# Patient Record
Sex: Female | Born: 1995 | Race: Black or African American | Hispanic: No | Marital: Single | Smoking: Never smoker
Health system: Southern US, Community
[De-identification: ages and names within clinical notes are randomized; demographics above are authoritative.]

## PROBLEM LIST (undated history)

## (undated) ENCOUNTER — Inpatient Hospital Stay (HOSPITAL_COMMUNITY): Payer: Self-pay

## (undated) ENCOUNTER — Emergency Department (HOSPITAL_COMMUNITY): Admission: EM | Payer: Medicaid Other | Source: Home / Self Care

## (undated) DIAGNOSIS — R519 Headache, unspecified: Secondary | ICD-10-CM

## (undated) DIAGNOSIS — N390 Urinary tract infection, site not specified: Secondary | ICD-10-CM

## (undated) DIAGNOSIS — R011 Cardiac murmur, unspecified: Secondary | ICD-10-CM

## (undated) DIAGNOSIS — F329 Major depressive disorder, single episode, unspecified: Secondary | ICD-10-CM

## (undated) DIAGNOSIS — J45909 Unspecified asthma, uncomplicated: Secondary | ICD-10-CM

## (undated) DIAGNOSIS — L309 Dermatitis, unspecified: Secondary | ICD-10-CM

## (undated) DIAGNOSIS — R87629 Unspecified abnormal cytological findings in specimens from vagina: Secondary | ICD-10-CM

## (undated) DIAGNOSIS — F419 Anxiety disorder, unspecified: Secondary | ICD-10-CM

## (undated) DIAGNOSIS — F32A Depression, unspecified: Secondary | ICD-10-CM

## (undated) DIAGNOSIS — F319 Bipolar disorder, unspecified: Secondary | ICD-10-CM

## (undated) DIAGNOSIS — A549 Gonococcal infection, unspecified: Secondary | ICD-10-CM

## (undated) HISTORY — DX: Cardiac murmur, unspecified: R01.1

## (undated) HISTORY — PX: WRIST SURGERY: SHX841

## (undated) HISTORY — DX: Unspecified asthma, uncomplicated: J45.909

## (undated) HISTORY — DX: Unspecified abnormal cytological findings in specimens from vagina: R87.629

## (undated) HISTORY — PX: CATARACT EXTRACTION: SUR2

## (undated) HISTORY — PX: WISDOM TOOTH EXTRACTION: SHX21

---
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
## 2016-01-14 ENCOUNTER — Ambulatory Visit: Payer: Self-pay

## 2016-01-14 DIAGNOSIS — N390 Urinary tract infection, site not specified: Secondary | ICD-10-CM

## 2016-01-14 LAB — POCT URINALYSIS DIP (DEVICE)
Bilirubin Urine: NEGATIVE
Glucose, UA: 100 mg/dL — AB
Ketones, ur: NEGATIVE mg/dL
NITRITE: POSITIVE — AB
PH: 5.5 (ref 5.0–8.0)
Protein, ur: NEGATIVE mg/dL
Specific Gravity, Urine: <=1.005 (ref 1.005–1.030)
Urobilinogen, UA: 1 mg/dL (ref 0.0–1.0)

## 2016-01-14 MED ORDER — SULFAMETHOXAZOLE-TRIMETHOPRIM 800-160 MG PO TABS: 1.0000 | ORAL_TABLET | Freq: Two times a day (BID) | ORAL | Status: DC

## 2016-01-14 MED ORDER — PHENAZOPYRIDINE HCL 200 MG PO TABS: 200.0000 mg | ORAL_TABLET | Freq: Three times a day (TID) | ORAL | Status: DC | PRN

## 2016-01-14 NOTE — Progress Notes (Signed)
Pt reports having pain with urination.  Pt states "Feels like spikes as I go to the bathroom".  Pt informed me that she had taken a friends medication that started with a p.  I advised pt to never take another person's medication.  Pt stated understanding.  Pt prescribed Bactrim DS and pyridium according to standing orders.  Urine culture sent.  Pt informed that if there is a different form of regimen that needs to be done we will call her.  Pt stated understanding with no further questions.

## 2016-01-15 ENCOUNTER — Ambulatory Visit: Payer: Medicaid Other | Admitting: Obstetrics

## 2016-01-16 LAB — URINE CULTURE
Colony Count: NO GROWTH
Organism ID, Bacteria: NO GROWTH

## 2016-02-16 ENCOUNTER — Encounter: Payer: Self-pay | Admitting: *Deleted

## 2016-02-16 ENCOUNTER — Encounter: Payer: Self-pay | Admitting: Obstetrics

## 2016-02-16 ENCOUNTER — Ambulatory Visit (INDEPENDENT_AMBULATORY_CARE_PROVIDER_SITE_OTHER): Payer: Medicaid Other | Admitting: Obstetrics

## 2016-02-16 VITALS — BP 122/76 | HR 102 | Temp 98.1°F | Wt 148.0 lb

## 2016-02-16 DIAGNOSIS — A499 Bacterial infection, unspecified: Secondary | ICD-10-CM | POA: Diagnosis not present

## 2016-02-16 DIAGNOSIS — B373 Candidiasis of vulva and vagina: Secondary | ICD-10-CM

## 2016-02-16 DIAGNOSIS — N898 Other specified noninflammatory disorders of vagina: Secondary | ICD-10-CM

## 2016-02-16 DIAGNOSIS — N760 Acute vaginitis: Secondary | ICD-10-CM | POA: Diagnosis not present

## 2016-02-16 DIAGNOSIS — B9689 Other specified bacterial agents as the cause of diseases classified elsewhere: Secondary | ICD-10-CM

## 2016-02-16 DIAGNOSIS — B3731 Acute candidiasis of vulva and vagina: Secondary | ICD-10-CM

## 2016-02-16 LAB — POCT URINALYSIS DIPSTICK
BILIRUBIN UA: NEGATIVE
Blood, UA: NEGATIVE
KETONES UA: NEGATIVE
Nitrite, UA: NEGATIVE
Protein, UA: NEGATIVE
SPEC GRAV UA: 1.02
Urobilinogen, UA: NEGATIVE
pH, UA: 5

## 2016-02-16 MED ORDER — METRONIDAZOLE 500 MG PO TABS: 500.0000 mg | ORAL_TABLET | Freq: Two times a day (BID) | ORAL | Status: DC

## 2016-02-16 MED ORDER — FLUCONAZOLE 150 MG PO TABS: 150.0000 mg | ORAL_TABLET | Freq: Once | ORAL | Status: DC

## 2016-02-16 NOTE — Progress Notes (Signed)
Patient ID: Andrea Jenkins, female   DOB: 09/26/1996, 20 y.o.   MRN: [id number]  Chief Complaint  Patient presents with  . Gynecologic Exam    Patient complains of alot of discharge this morning. Patient states it has an odor. Some uncomfortable feeling in the vagina.    HPI Andrea Gowdaonesha L Elman is a 20 y.o. female.  Malodorous vaginal discharge and vaginal irritation.  HPI  History reviewed. No pertinent past medical history.  Past Surgical History  Procedure Laterality Date  . No past surgeries      Family History  Problem Relation Age of Onset  . Cancer Neg Hx   . Heart disease Neg Hx   . Diabetes Neg Hx   . Hypertension Maternal Grandmother     Social History Social History  Substance Use Topics  . Smoking status: Passive Smoke Exposure - Never Smoker -- 1.00 packs/day    Types: Cigars, Cigarettes  . Smokeless tobacco: Never Used  . Alcohol Use: No     Comment: occasional    No Known Allergies  Current Outpatient Prescriptions  Medication Sig Dispense Refill  . fluconazole (DIFLUCAN) 150 MG tablet Take 1 tablet (150 mg total) by mouth once. 1 tablet 2  . metroNIDAZOLE (FLAGYL) 500 MG tablet Take 1 tablet (500 mg total) by mouth 2 (two) times daily. 14 tablet 2   No current facility-administered medications for this visit.    Review of Systems Review of Systems Constitutional: negative for fatigue and weight loss Respiratory: negative for cough and wheezing Cardiovascular: negative for chest pain, fatigue and palpitations Gastrointestinal: negative for abdominal pain and change in bowel habits Genitourinary: positive for vaginal discharge and irritation Integument/breast: negative for nipple discharge Musculoskeletal:negative for myalgias Neurological: negative for gait problems and tremors Behavioral/Psych: negative for abusive relationship, depression Endocrine: negative for temperature intolerance     Blood pressure 122/76, pulse 102, temperature 98.1  F (36.7 C), weight 148 lb (67.132 kg), last menstrual period 02/10/2016.  Physical Exam Physical Exam            General:  Alert and no distress Abdomen:  normal findings: no organomegaly, soft, non-tender and no hernia  Pelvis:  External genitalia: normal general appearance Urinary system: urethral meatus normal and bladder without fullness, nontender Vaginal: normal without tenderness, induration or masses Cervix: normal appearance Adnexa: normal bimanual exam Uterus: anteverted and non-tender, normal size      Data Reviewed Labs  Assessment     BV     Plan    Flagyl Rx F/U prn   Orders Placed This Encounter  Procedures  . NuSwab Vaginitis Plus (VG+)  . POCT urinalysis dipstick   Meds ordered this encounter  Medications  . metroNIDAZOLE (FLAGYL) 500 MG tablet    Sig: Take 1 tablet (500 mg total) by mouth 2 (two) times daily.    Dispense:  14 tablet    Refill:  2  . fluconazole (DIFLUCAN) 150 MG tablet    Sig: Take 1 tablet (150 mg total) by mouth once.    Dispense:  1 tablet    Refill:  2

## 2016-02-19 ENCOUNTER — Other Ambulatory Visit: Payer: Self-pay | Admitting: Obstetrics

## 2016-02-19 DIAGNOSIS — A549 Gonococcal infection, unspecified: Secondary | ICD-10-CM

## 2016-02-19 LAB — NUSWAB VAGINITIS PLUS (VG+)
Atopobium vaginae: HIGH Score — AB
Candida albicans, NAA: POSITIVE — AB
Candida glabrata, NAA: NEGATIVE
Chlamydia trachomatis, NAA: NEGATIVE
NEISSERIA GONORRHOEAE, NAA: POSITIVE — AB
Trich vag by NAA: NEGATIVE

## 2016-02-19 MED ORDER — AZITHROMYCIN 250 MG PO TABS: 1000.0000 mg | ORAL_TABLET | Freq: Once | ORAL | Status: DC

## 2016-02-19 MED ORDER — CEFTRIAXONE SODIUM 250 MG IJ SOLR: 250.0000 mg | Freq: Once | INTRAMUSCULAR | Status: DC

## 2016-02-23 ENCOUNTER — Telehealth: Payer: Self-pay | Admitting: *Deleted

## 2016-02-24 ENCOUNTER — Ambulatory Visit: Payer: Medicaid Other

## 2016-02-24 VITALS — BP 109/71 | HR 65 | Wt 148.0 lb

## 2016-02-24 DIAGNOSIS — A549 Gonococcal infection, unspecified: Secondary | ICD-10-CM

## 2016-02-24 MED ORDER — CEFTRIAXONE SODIUM 1 G IJ SOLR
250.0000 mg | Freq: Once | INTRAMUSCULAR | Status: AC
  Administered 2016-02-24: 250 mg via INTRAMUSCULAR

## 2016-02-24 NOTE — Progress Notes (Unsigned)
Pt is in office for Rocephin injection for +GC. Pt tolerated injection well.  Pt advised of all STD recommendations. Pt advised to f/u in a couple of months for repeat testing.   Pt states that she may get lab work at that visit, does not want today.  Administrations This Visit    cefTRIAXone (ROCEPHIN) injection 250 mg    Admin Date Action Dose Route Administered By         02/24/2016 Given 250 mg Intramuscular Lanney GinsSuzanne D Anberlyn Feimster, CMA

## 2016-02-25 NOTE — Telephone Encounter (Signed)
Error- see lab note

## 2016-03-10 ENCOUNTER — Ambulatory Visit (INDEPENDENT_AMBULATORY_CARE_PROVIDER_SITE_OTHER): Payer: Medicaid Other | Admitting: Obstetrics

## 2016-03-10 VITALS — BP 104/66 | HR 66 | Wt 154.0 lb

## 2016-03-10 DIAGNOSIS — A549 Gonococcal infection, unspecified: Secondary | ICD-10-CM

## 2016-03-10 MED ORDER — CEFTRIAXONE SODIUM 250 MG IJ SOLR: 250.0000 mg | Freq: Once | INTRAMUSCULAR | Status: DC

## 2016-03-10 MED ORDER — CEFTRIAXONE SODIUM 1 G IJ SOLR
250.0000 mg | Freq: Once | INTRAMUSCULAR | Status: AC
  Administered 2016-03-10: 250 mg via INTRAMUSCULAR

## 2016-03-10 MED ORDER — AZITHROMYCIN 250 MG PO TABS: 1000.0000 mg | ORAL_TABLET | Freq: Once | ORAL | Status: DC

## 2016-03-11 ENCOUNTER — Encounter: Payer: Self-pay | Admitting: Obstetrics

## 2016-03-11 NOTE — Progress Notes (Signed)
Patient ID: Andrea Jenkins, female   DOB: 22-Sep-1996, 20 y.o.   MRN: [id number]  Chief Complaint  Patient presents with  . 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 female.  H/O GC exposure and treatment. Had sex before partner was treated.  HPI  History reviewed. No pertinent past medical history.  Past Surgical History  Procedure Laterality Date  . No past surgeries      Family History  Problem Relation Age of Onset  . Cancer Neg Hx   . Heart disease Neg Hx   . Diabetes Neg Hx   . Hypertension Maternal Grandmother     Social History Social History  Substance Use Topics  . Smoking status: Passive Smoke Exposure - Never Smoker -- 1.00 packs/day    Types: Cigars, Cigarettes  . Smokeless tobacco: Never Used  . Alcohol Use: No     Comment: occasional    No Known Allergies  Current Outpatient Prescriptions  Medication Sig Dispense Refill  . Probiotic Product (PROBIOTIC DAILY PO) Take by mouth.    Marland Kitchen azithromycin (ZITHROMAX) 250 MG tablet Take 4 tablets (1,000 mg total) by mouth once. 4 tablet 0  . cefTRIAXone (ROCEPHIN) 250 MG injection Inject 250 mg into the muscle once. FOR IM use in LARGE MUSCLE MASS 1 each 0   No current facility-administered medications for this visit.    Review of Systems Review of Systems Constitutional: negative for fatigue and weight loss Respiratory: negative for cough and wheezing Cardiovascular: negative for chest pain, fatigue and palpitations Gastrointestinal: negative for abdominal pain and change in bowel habits Genitourinary:negative Integument/breast: negative for nipple discharge Musculoskeletal:negative for myalgias Neurological: negative for gait problems and tremors Behavioral/Psych: negative for abusive relationship, depression Endocrine: negative for temperature intolerance     Blood pressure 104/66, pulse 66, weight 154 lb (69.854 kg), last menstrual period  03/09/2016.  Physical Exam Physical Exam:  Deferred                                                                                                                                                                                                                                                                  Data  Reviewed Labs  Assessment     [facility]    Plan    Rocephin / Azithromycin Rx F/U in 3 months for TOC cultures   No orders of the defined types were placed in this encounter.   Meds ordered this encounter  Medications  . cefTRIAXone (ROCEPHIN) 250 MG injection    Sig: Inject 250 mg into the muscle once. FOR IM use in LARGE MUSCLE MASS    Dispense:  1 each    Refill:  0  . azithromycin (ZITHROMAX) 250 MG tablet    Sig: Take 4 tablets (1,000 mg total) by mouth once.    Dispense:  4 tablet    Refill:  0  . cefTRIAXone (ROCEPHIN) injection 250 mg    Sig:     Order Specific Question:  Antibiotic Indication:    Answer:  STD

## 2016-04-28 ENCOUNTER — Ambulatory Visit: Payer: Medicaid Other | Admitting: Obstetrics

## 2016-04-30 ENCOUNTER — Ambulatory Visit: Payer: Medicaid Other | Admitting: Obstetrics

## 2016-05-21 ENCOUNTER — Encounter (HOSPITAL_COMMUNITY): Payer: Self-pay | Admitting: *Deleted

## 2016-05-21 ENCOUNTER — Emergency Department (HOSPITAL_COMMUNITY)
Admission: EM | Admit: 2016-05-21 | Discharge: 2016-05-22 | Disposition: A | Discharge status: Home / Self Care | Payer: Medicaid Other | Attending: Emergency Medicine | Admitting: Emergency Medicine

## 2016-05-21 DIAGNOSIS — Z7722 Contact with and (suspected) exposure to environmental tobacco smoke (acute) (chronic): Secondary | ICD-10-CM | POA: Insufficient documentation

## 2016-05-21 DIAGNOSIS — Z202 Contact with and (suspected) exposure to infections with a predominantly sexual mode of transmission: Secondary | ICD-10-CM | POA: Insufficient documentation

## 2016-05-21 DIAGNOSIS — M545 Low back pain, unspecified: Secondary | ICD-10-CM

## 2016-05-21 LAB — URINALYSIS, ROUTINE W REFLEX MICROSCOPIC
BILIRUBIN URINE: NEGATIVE
GLUCOSE, UA: NEGATIVE mg/dL
HGB URINE DIPSTICK: NEGATIVE
Ketones, ur: 15 mg/dL — AB
Leukocytes, UA: NEGATIVE
Nitrite: NEGATIVE
PH: 7 (ref 5.0–8.0)
Protein, ur: 30 mg/dL — AB
SPECIFIC GRAVITY, URINE: 1.026 (ref 1.005–1.030)

## 2016-05-21 LAB — URINE MICROSCOPIC-ADD ON

## 2016-05-21 LAB — WET PREP, GENITAL
Sperm: NONE SEEN
TRICH WET PREP: NONE SEEN
YEAST WET PREP: NONE SEEN

## 2016-05-21 LAB — POC URINE PREG, ED: Preg Test, Ur: NEGATIVE

## 2016-05-21 MED ORDER — CEFTRIAXONE SODIUM 250 MG IJ SOLR
250.0000 mg | Freq: Once | INTRAMUSCULAR | Status: AC
  Administered 2016-05-22: 250 mg via INTRAMUSCULAR
  Filled 2016-05-21: qty 250

## 2016-05-21 MED ORDER — AZITHROMYCIN 600 MG PO TABS
1200.0000 mg | ORAL_TABLET | Freq: Once | ORAL | Status: AC
  Administered 2016-05-22: 1200 mg via ORAL
  Filled 2016-05-21: qty 2

## 2016-05-21 NOTE — ED Provider Notes (Signed)
Emergency Department Provider Note   I have reviewed the triage vital signs and the nursing notes.   HISTORY  Chief Complaint Back Pain and SEXUALLY TRANSMITTED DISEASE   HPI Andrea ZIELSDORF is a 20 y.o. female with PMH of UTI and prior STI presents to the emergency department for evaluation of left lower back pain in the setting of recent diagnosis of gonorrhea without being treated. Patient states that approximately 10 days ago she was diagnosed with gonorrhea. She states that time she was having some mild vaginal discharge and had some unprotected encounters leading her to seek testing. At that time she was tested for HIV as well and was told this was negative. The culture back the next day and told her she had gonorrhea but the patient did not get treatment at that time. Several days later she developed left lower back pain without associated dysuria, hesitancy, urgency. No trauma. Patient has continued vaginal discharge with no bleeding. Last menstrual period was May 09 2916. No abdominal discomfort, chest pain or difficulty breathing.  History reviewed. No pertinent past medical history.  Patient Active Problem List   Diagnosis Date Noted  . UTI symptoms 10/03/2014  . BV (bacterial vaginosis) 11/14/2013  . Vaginitis and vulvovaginitis, unspecified 02/07/2013    Past Surgical History:  Procedure Laterality Date  . NO PAST SURGERIES      Current Outpatient Rx  . Order #: [id number] Class: Normal  . Order #: [id number] Class: Sample  . Order #: [id number] Class: Historical Med    Allergies Review of patient's allergies indicates no known allergies.  Family History  Problem Relation Age of Onset  . Hypertension Maternal Grandmother   . Cancer Neg Hx   . Heart disease Neg Hx   . Diabetes Neg Hx     Social History Social History  Substance Use Topics  . Smoking status: Passive Smoke Exposure - Never Smoker    Packs/day: 1.00    Types: Cigars, Cigarettes  .  Smokeless tobacco: Never Used  . Alcohol use No     Comment: occasional    Review of Systems  Constitutional: No fever/chills Eyes: No visual changes. ENT: No sore throat. Cardiovascular: Denies chest pain. Respiratory: Denies shortness of breath. Gastrointestinal: No abdominal pain.  No nausea, no vomiting.  No diarrhea.  No constipation. Genitourinary: Negative for dysuria. Positive vaginal discharge Musculoskeletal: Left sided back discomfort.  Skin: Negative for rash. Neurological: Negative for headaches, focal weakness or numbness.  10-point ROS otherwise negative.  ____________________________________________   PHYSICAL EXAM:  VITAL SIGNS: ED Triage Vitals  Enc Vitals Group     BP 05/21/16 1744 118/83     Pulse Rate 05/21/16 1744 73     Resp 05/21/16 1744 18     Temp 05/21/16 1744 98.8 F (37.1 C)     Temp Source 05/21/16 1744 Oral     SpO2 05/21/16 1744 100 %     Weight 05/21/16 1744 144 lb 7 oz (65.5 kg)     Height 05/21/16 1744 5' 3.5" (1.613 m)     Pain Score 05/21/16 1743 8    Constitutional: Alert and oriented. Well appearing and in no acute distress. Eyes: Conjunctivae are normal. PERRL. EOMI. Head: Atraumatic. Nose: No congestion/rhinnorhea. Mouth/Throat: Mucous membranes are moist.  Oropharynx non-erythematous. Neck: No stridor.   Cardiovascular: Normal rate, regular rhythm. Good peripheral circulation. Grossly normal heart sounds.   Respiratory: Normal respiratory effort.  No retractions. Lungs CTAB. Gastrointestinal: Soft and nontender. No distention. Pelvic exam with  whitish vaginal discharge with no gross blood. No CMT. No adnexal tenderness or fullness.  Musculoskeletal: No lower extremity tenderness nor edema. No gross deformities of extremities. Neurologic:  Normal speech and language. No gross focal neurologic deficits are appreciated.  Skin:  Skin is warm, dry and intact. No rash noted. Psychiatric: Mood and affect are normal. Speech and  behavior are normal.  ____________________________________________   LABS (all labs ordered are listed, but only abnormal results are displayed)  Labs Reviewed  WET PREP, GENITAL - Abnormal; Notable for the following:       Result Value   Clue Cells Wet Prep HPF POC PRESENT (*)    WBC, Wet Prep HPF POC MODERATE (*)    All other components within normal limits  URINALYSIS, ROUTINE W REFLEX MICROSCOPIC (NOT AT [facility]) - Abnormal; Notable for the following:    APPearance CLOUDY (*)    Ketones, ur 15 (*)    Protein, ur 30 (*)    All other components within normal limits  URINE MICROSCOPIC-ADD ON - Abnormal; Notable for the following:    Squamous Epithelial / LPF 6-30 (*)    Bacteria, UA FEW (*)    All other components within normal limits  POC URINE PREG, ED  GC/CHLAMYDIA PROBE AMP ([place]) NOT AT [facility]   ____________________________________________  RADIOLOGY  None ____________________________________________   PROCEDURES  Procedure(s) performed:   Procedures  None ____________________________________________   INITIAL IMPRESSION / ASSESSMENT AND PLAN / ED COURSE  Pertinent labs & imaging results that were available during my care of the patient were reviewed by me and considered in my medical decision making (see chart for details).  Patient resents to the emergency department for evaluation of vaginal discharge in setting of recent diagnosis of gonorrhea for which she did not obtain treatment. After pelvic exam the patient has no adnexal or cervical motion tenderness to suggest PID or tubo-ovarian abscess. She has no clinical signs or symptoms to just pyelonephritis. We'll follow urinalysis, u preg, and STI testing. Did not order HIV or RPR with patient being recently tested. Plan to treat for uncomplicated STI.    ____________________________________________  FINAL CLINICAL IMPRESSION(S) / ED DIAGNOSES  Final diagnoses:  STD exposure  Left-sided low back  pain without sciatica     MEDICATIONS GIVEN DURING THIS VISIT:  Medications  cefTRIAXone (ROCEPHIN) injection 250 mg (not administered)  azithromycin (ZITHROMAX) tablet 1,200 mg (not administered)     NEW OUTPATIENT MEDICATIONS STARTED DURING THIS VISIT:  None    Note:  This document was prepared using Dragon voice recognition software and may include unintentional dictation errors.  Alona Bene, MD Emergency Medicine   Maia Plan, MD 05/22/16 Burna Mortimer

## 2016-05-21 NOTE — ED Notes (Signed)
Dr. Long at bedside at this time.  

## 2016-05-21 NOTE — ED Triage Notes (Signed)
Pt reports having lower back pain, denies urinary symptoms. Has been tested for std and was + for gonorrhea and needs treatment.

## 2016-05-22 MED ORDER — STERILE WATER FOR INJECTION IJ SOLN
INTRAMUSCULAR | Status: AC
  Administered 2016-05-22: 1 mL
  Filled 2016-05-22: qty 10

## 2016-05-22 NOTE — ED Notes (Signed)
Pt departed in NAD, refused use of wheelchair.  

## 2016-05-22 NOTE — Discharge Instructions (Signed)

## 2016-05-25 LAB — GC/CHLAMYDIA PROBE AMP (~~LOC~~) NOT AT ARMC
CHLAMYDIA, DNA PROBE: NEGATIVE
Neisseria Gonorrhea: NEGATIVE

## 2016-06-03 ENCOUNTER — Ambulatory Visit (INDEPENDENT_AMBULATORY_CARE_PROVIDER_SITE_OTHER): Payer: Medicaid Other | Admitting: Obstetrics

## 2016-06-03 ENCOUNTER — Encounter: Payer: Self-pay | Admitting: Obstetrics

## 2016-06-03 VITALS — BP 122/82 | HR 66 | Wt 146.0 lb

## 2016-06-03 DIAGNOSIS — A549 Gonococcal infection, unspecified: Secondary | ICD-10-CM

## 2016-06-03 DIAGNOSIS — Z202 Contact with and (suspected) exposure to infections with a predominantly sexual mode of transmission: Secondary | ICD-10-CM

## 2016-06-03 DIAGNOSIS — Z3202 Encounter for pregnancy test, result negative: Secondary | ICD-10-CM

## 2016-06-03 DIAGNOSIS — Z30013 Encounter for initial prescription of injectable contraceptive: Secondary | ICD-10-CM

## 2016-06-03 DIAGNOSIS — Z113 Encounter for screening for infections with a predominantly sexual mode of transmission: Secondary | ICD-10-CM

## 2016-06-03 DIAGNOSIS — B9689 Other specified bacterial agents as the cause of diseases classified elsewhere: Secondary | ICD-10-CM

## 2016-06-03 DIAGNOSIS — A499 Bacterial infection, unspecified: Secondary | ICD-10-CM

## 2016-06-03 DIAGNOSIS — N760 Acute vaginitis: Secondary | ICD-10-CM

## 2016-06-03 DIAGNOSIS — Z3009 Encounter for other general counseling and advice on contraception: Secondary | ICD-10-CM

## 2016-06-03 LAB — POCT URINE PREGNANCY: PREG TEST UR: NEGATIVE

## 2016-06-03 MED ORDER — CEFTRIAXONE SODIUM 1 G IJ SOLR
250.0000 mg | Freq: Once | INTRAMUSCULAR | Status: AC
  Administered 2016-06-03: 250 mg via INTRAMUSCULAR

## 2016-06-03 MED ORDER — AZITHROMYCIN 250 MG PO TABS: 1000.0000 mg | ORAL_TABLET | Freq: Once | ORAL | 0 refills | Status: AC

## 2016-06-03 MED ORDER — METRONIDAZOLE 500 MG PO TABS: 500.0000 mg | ORAL_TABLET | Freq: Two times a day (BID) | ORAL | 2 refills | Status: DC

## 2016-06-03 MED ORDER — MEDROXYPROGESTERONE ACETATE 150 MG/ML IM SUSP: 150.0000 mg | INTRAMUSCULAR | 3 refills | Status: DC

## 2016-06-03 MED ORDER — CEFTRIAXONE SODIUM 250 MG IJ SOLR: 250.0000 mg | Freq: Once | INTRAMUSCULAR | 0 refills | Status: AC

## 2016-06-03 NOTE — Progress Notes (Signed)
Patient ID: Andrea Jenkins, female   DOB: 06/01/1996, 20 y.o.   MRN: [id number]  Chief Complaint  Patient presents with  . Follow-up      HPI Andrea Jenkins is a 20 y.o. female.  H/O positive GC recently and thinks that she has been re-exposed from known positive partner, untreated.  Denies abnormal vaginal discharge or vaginal bleeding or pelvic pain. HPI  History reviewed. No pertinent past medical history.  Past Surgical History:  Procedure Laterality Date  . NO PAST SURGERIES      Family History  Problem Relation Age of Onset  . Hypertension Maternal Grandmother   . Cancer Neg Hx   . Heart disease Neg Hx   . Diabetes Neg Hx     Social History Social History  Substance Use Topics  . Smoking status: Passive Smoke Exposure - Never Smoker    Packs/day: 1.00    Types: Cigars, Cigarettes  . Smokeless tobacco: Never Used  . Alcohol use No     Comment: occasional    No Known Allergies  Current Outpatient Prescriptions  Medication Sig Dispense Refill  . Probiotic Product (PROBIOTIC DAILY PO) Take by mouth.    Marland Kitchen azithromycin (ZITHROMAX) 250 MG tablet Take 4 tablets (1,000 mg total) by mouth once. 4 tablet 0  . cefTRIAXone (ROCEPHIN) 250 MG injection Inject 250 mg into the muscle once. FOR IM use in LARGE MUSCLE MASS 1 each 0  . medroxyPROGESTERone (DEPO-PROVERA) 150 MG/ML injection Inject 1 mL (150 mg total) into the muscle every 3 (three) months. 1 mL 3  . metroNIDAZOLE (FLAGYL) 500 MG tablet Take 1 tablet (500 mg total) by mouth 2 (two) times daily. 14 tablet 2   No current facility-administered medications for this visit.     Review of Systems Review of Systems Constitutional: negative for fatigue and weight loss Respiratory: negative for cough and wheezing Cardiovascular: negative for chest pain, fatigue and palpitations Gastrointestinal: negative for abdominal pain and change in bowel habits Genitourinary:negative Integument/breast: negative for nipple  discharge Musculoskeletal:negative for myalgias Neurological: negative for gait problems and tremors Behavioral/Psych: negative for abusive relationship, depression Endocrine: negative for temperature intolerance     Blood pressure 122/82, pulse 66, weight 146 lb (66.2 kg), last menstrual period 05/06/2016.  Physical Exam Physical Exam           General:  Alert and no distress Abdomen:  normal findings: no organomegaly, soft, non-tender and no hernia  Pelvis:  External genitalia: normal general appearance Urinary system: urethral meatus normal and bladder without fullness, nontender Vaginal: normal without tenderness, induration or masses Cervix: normal appearance Adnexa: normal bimanual exam Uterus: anteverted and non-tender, normal size     Data Reviewed Labs  Assessment     H/O positive GC Possible STD Re-xposure BV Contraceptive counseling and advice.  Wants Depo Provera.    Plan    Rocephin IM given today. Azithromycin Rx Flagyl Rx Depo Provera Rx.  Has had sex over past 2 weeks, unprotected. F/U in 2 weeks for Depo Injection with abstinence and negative UPT.  No orders of the defined types were placed in this encounter.  Meds ordered this encounter  Medications  . cefTRIAXone (ROCEPHIN) 250 MG injection    Sig: Inject 250 mg into the muscle once. FOR IM use in LARGE MUSCLE MASS    Dispense:  1 each    Refill:  0  . azithromycin (ZITHROMAX) 250 MG tablet    Sig: Take 4 tablets (1,000 mg total) by  mouth once.    Dispense:  4 tablet    Refill:  0  . metroNIDAZOLE (FLAGYL) 500 MG tablet    Sig: Take 1 tablet (500 mg total) by mouth 2 (two) times daily.    Dispense:  14 tablet    Refill:  2  . medroxyPROGESTERone (DEPO-PROVERA) 150 MG/ML injection    Sig: Inject 1 mL (150 mg total) into the muscle every 3 (three) months.    Dispense:  1 mL    Refill:  3  . cefTRIAXone (ROCEPHIN) injection 250 mg    Order Specific Question:   Antibiotic Indication:     Answer:   STD

## 2016-06-07 LAB — NUSWAB VG+, CANDIDA 6SP
CANDIDA KRUSEI, NAA: NEGATIVE
CANDIDA LUSITANIAE, NAA: NEGATIVE
CHLAMYDIA TRACHOMATIS, NAA: NEGATIVE
Candida albicans, NAA: NEGATIVE
Candida glabrata, NAA: NEGATIVE
Candida parapsilosis, NAA: NEGATIVE
Candida tropicalis, NAA: NEGATIVE
NEISSERIA GONORRHOEAE, NAA: POSITIVE — AB
TRICH VAG BY NAA: NEGATIVE

## 2016-06-08 ENCOUNTER — Other Ambulatory Visit: Payer: Self-pay | Admitting: Obstetrics

## 2016-06-08 DIAGNOSIS — A5403 Gonococcal cervicitis, unspecified: Secondary | ICD-10-CM

## 2016-06-08 MED ORDER — CEFTRIAXONE SODIUM 250 MG IJ SOLR: 250.0000 mg | Freq: Once | INTRAMUSCULAR | 0 refills | Status: AC

## 2016-06-08 MED ORDER — AZITHROMYCIN 250 MG PO TABS: 1000.0000 mg | ORAL_TABLET | Freq: Once | ORAL | 0 refills | Status: AC

## 2016-06-10 ENCOUNTER — Telehealth: Payer: Self-pay | Admitting: *Deleted

## 2016-06-10 ENCOUNTER — Ambulatory Visit: Payer: Medicaid Other | Admitting: Obstetrics

## 2016-06-10 NOTE — Telephone Encounter (Signed)
Patient aware of lab results and [facility]

## 2016-06-17 ENCOUNTER — Ambulatory Visit: Payer: Medicaid Other

## 2016-06-26 ENCOUNTER — Emergency Department (HOSPITAL_COMMUNITY)
Admission: EM | Admit: 2016-06-26 | Discharge: 2016-06-26 | Disposition: A | Discharge status: Home / Self Care | Payer: Medicaid Other | Attending: Emergency Medicine | Admitting: Emergency Medicine

## 2016-06-26 ENCOUNTER — Encounter (HOSPITAL_COMMUNITY): Payer: Self-pay | Admitting: Emergency Medicine

## 2016-06-26 DIAGNOSIS — Z7722 Contact with and (suspected) exposure to environmental tobacco smoke (acute) (chronic): Secondary | ICD-10-CM | POA: Diagnosis not present

## 2016-06-26 DIAGNOSIS — Z202 Contact with and (suspected) exposure to infections with a predominantly sexual mode of transmission: Secondary | ICD-10-CM | POA: Insufficient documentation

## 2016-06-26 DIAGNOSIS — B354 Tinea corporis: Secondary | ICD-10-CM | POA: Diagnosis not present

## 2016-06-26 DIAGNOSIS — Z79899 Other long term (current) drug therapy: Secondary | ICD-10-CM | POA: Diagnosis not present

## 2016-06-26 LAB — RPR: RPR: NONREACTIVE

## 2016-06-26 LAB — HIV ANTIBODY (ROUTINE TESTING W REFLEX): HIV Screen 4th Generation wRfx: NONREACTIVE

## 2016-06-26 MED ORDER — ONDANSETRON 4 MG PO TBDP
4.0000 mg | ORAL_TABLET | Freq: Once | ORAL | Status: AC
  Administered 2016-06-26: 4 mg via ORAL
  Filled 2016-06-26: qty 1

## 2016-06-26 MED ORDER — CEFTRIAXONE SODIUM 250 MG IJ SOLR
250.0000 mg | Freq: Once | INTRAMUSCULAR | Status: AC
  Administered 2016-06-26: 250 mg via INTRAMUSCULAR
  Filled 2016-06-26: qty 250

## 2016-06-26 MED ORDER — AZITHROMYCIN 250 MG PO TABS
1000.0000 mg | ORAL_TABLET | Freq: Once | ORAL | Status: AC
  Administered 2016-06-26: 1000 mg via ORAL
  Filled 2016-06-26: qty 4

## 2016-06-26 MED ORDER — CLOTRIMAZOLE 1 % EX CREA: TOPICAL_CREAM | CUTANEOUS | 0 refills | Status: DC

## 2016-06-26 NOTE — ED Triage Notes (Signed)
Pt. requesting STD screening reports vaginal discharge and unprotected sex , denies fever , no dysuria .

## 2016-06-26 NOTE — ED Provider Notes (Signed)
[facility]-EMERGENCY DEPT Provider Note   CSN: [id number] Arrival date & time: 06/26/16  0307     History   Chief Complaint Chief Complaint  Patient presents with  . Exposure to STD    HPI Andrea Jenkins is a 20 y.o. female.  HPI  Patient to the ER with complaints of UTI, BV, and vaginitis. She is here to be treated for Gonorrhea, she says she and her boyfriend keep giving it back and forth to each other because they are not waiting long enough before resuming sexual activity.  She has had some scant discharge. Her boyfriend is due to be seen this week for treatment.. They had sex last night and the condom broke.  Denies fevers, chills, nausea, vomiting, diarrhea, abdominal pain.  History reviewed. No pertinent past medical history.  Patient Active Problem List   Diagnosis Date Noted  . UTI symptoms 10/03/2014  . BV (bacterial vaginosis) 11/14/2013  . Vaginitis and vulvovaginitis, unspecified 02/07/2013    Past Surgical History:  Procedure Laterality Date  . NO PAST SURGERIES      OB History    Gravida Para Term Preterm AB Living   0 0 0 0 0 0   SAB TAB Ectopic Multiple Live Births   0 0 0 0         Home Medications    Prior to Admission medications   Medication Sig Start Date End Date Taking? Authorizing Provider  Probiotic Product (PROBIOTIC DAILY PO) Take 1 tablet by mouth once a week.    Yes Historical Provider, MD  clotrimazole (LOTRIMIN) 1 % cream Apply to affected area 2 times daily 06/26/16   Marlon Peliffany Kiante Petrovich, PA-C  medroxyPROGESTERone (DEPO-PROVERA) 150 MG/ML injection Inject 1 mL (150 mg total) into the muscle every 3 (three) months. Patient not taking: Reported on 06/26/2016 06/03/16   Brock Badharles A Harper, MD  metroNIDAZOLE (FLAGYL) 500 MG tablet Take 1 tablet (500 mg total) by mouth 2 (two) times daily. Patient not taking: Reported on 06/26/2016 06/03/16   Brock Badharles A Harper, MD    Family History Family History  Problem Relation Age of Onset  . Hypertension  Maternal Grandmother   . Cancer Neg Hx   . Heart disease Neg Hx   . Diabetes Neg Hx     Social History Social History  Substance Use Topics  . Smoking status: Passive Smoke Exposure - Never Smoker    Packs/day: 0.00    Types: Cigars, Cigarettes  . Smokeless tobacco: Never Used  . Alcohol use No     Comment: occasional     Allergies   Review of patient's allergies indicates no known allergies.   Review of Systems Review of Systems  Review of Systems All other systems negative except as documented in the HPI. All pertinent positives and negatives as reviewed in the HPI.  Physical Exam Updated Vital Signs BP 123/81   Pulse 88   Temp 98.2 F (36.8 C) (Oral)   Resp 16   Ht 5\' 3"  (1.6 m)   Wt 67.1 kg   SpO2 100%   BMI 26.22 kg/m   Physical Exam  Constitutional: She appears well-developed and well-nourished. No distress.  HENT:  Head: Normocephalic and atraumatic.  Eyes: Pupils are equal, round, and reactive to light.  Neck: Normal range of motion. Neck supple.  Cardiovascular: Normal rate and regular rhythm.   Pulmonary/Chest: Effort normal.  Abdominal: Soft.  Genitourinary:  Genitourinary Comments: Pt declined pelvic  Neurological: She is alert.  Skin: Skin  is warm and dry.  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   ED Treatments / Results  Labs (all labs ordered are listed, but only abnormal results are displayed) Labs Reviewed  HIV ANTIBODY (ROUTINE TESTING)  RPR    EKG  EKG Interpretation None       Radiology No results found.  Procedures Procedures (including critical care time)  Medications Ordered in ED Medications  cefTRIAXone (ROCEPHIN) injection 250 mg (250 mg Intramuscular Given 06/26/16 0446)  azithromycin (ZITHROMAX) tablet 1,000 mg (1,000 mg Oral Given 06/26/16 0445)  ondansetron (ZOFRAN-ODT) disintegrating tablet 4 mg (4 mg Oral Given 06/26/16 0445)     Initial Impression / Assessment and Plan / ED  Course  I have reviewed the triage vital signs and the nursing notes.  Pertinent labs & imaging results that were available during my care of the patient were reviewed by me and considered in my medical decision making (see chart for details).  Clinical Course    RPR and HIV pending Lotrimin cream for ring worm. Advised to practice safe sex and have all partners evaluated and treated at the local health department. Also advised to follow with the health Department in 1-2 weeks to confirm effectiveness of treatment and receive additional education/evaluation. Return precautions given.  Patient with tinea infection. Will treat with anti-fungal medication. Pt instructed to keep the area dry. Contact precautions given. No signs of secondary infection. Follow up with dermatology in 2-3 days. Return precautions discussed. Pt is safe for discharge at this time.   Final Clinical Impressions(s) / ED Diagnoses   Final diagnoses:  Tinea corporis  STD exposure    New Prescriptions New Prescriptions   CLOTRIMAZOLE (LOTRIMIN) 1 % CREAM    Apply to affected area 2 times daily     Marlon Pel, PA-C 06/26/16 0505    Rolan Bucco, MD 06/26/16 [phone]

## 2016-06-26 NOTE — ED Notes (Signed)
Pt left prior to recieivng discharge instructions.

## 2016-08-12 ENCOUNTER — Encounter (HOSPITAL_COMMUNITY): Payer: Self-pay | Admitting: *Deleted

## 2016-08-12 ENCOUNTER — Emergency Department (HOSPITAL_COMMUNITY)
Admission: EM | Admit: 2016-08-12 | Discharge: 2016-08-12 | Disposition: A | Discharge status: Home / Self Care | Payer: No Typology Code available for payment source | Attending: Emergency Medicine | Admitting: Emergency Medicine

## 2016-08-12 DIAGNOSIS — Z7722 Contact with and (suspected) exposure to environmental tobacco smoke (acute) (chronic): Secondary | ICD-10-CM | POA: Diagnosis not present

## 2016-08-12 DIAGNOSIS — Y999 Unspecified external cause status: Secondary | ICD-10-CM | POA: Diagnosis not present

## 2016-08-12 DIAGNOSIS — S0083XA Contusion of other part of head, initial encounter: Secondary | ICD-10-CM | POA: Diagnosis not present

## 2016-08-12 DIAGNOSIS — Y939 Activity, unspecified: Secondary | ICD-10-CM | POA: Diagnosis not present

## 2016-08-12 DIAGNOSIS — S0993XA Unspecified injury of face, initial encounter: Secondary | ICD-10-CM | POA: Diagnosis present

## 2016-08-12 DIAGNOSIS — M62838 Other muscle spasm: Secondary | ICD-10-CM | POA: Insufficient documentation

## 2016-08-12 DIAGNOSIS — Y92410 Unspecified street and highway as the place of occurrence of the external cause: Secondary | ICD-10-CM | POA: Insufficient documentation

## 2016-08-12 MED ORDER — CYCLOBENZAPRINE HCL 10 MG PO TABS: 10.0000 mg | ORAL_TABLET | Freq: Two times a day (BID) | ORAL | 0 refills | Status: DC | PRN

## 2016-08-12 MED ORDER — METHOCARBAMOL 500 MG PO TABS
500.0000 mg | ORAL_TABLET | Freq: Once | ORAL | Status: AC
Start: 2016-08-12 — End: 2016-08-12
  Administered 2016-08-12: 500 mg via ORAL
  Filled 2016-08-12: qty 1

## 2016-08-12 MED ORDER — IBUPROFEN 400 MG PO TABS
600.0000 mg | ORAL_TABLET | Freq: Once | ORAL | Status: AC
  Administered 2016-08-12: 600 mg via ORAL
  Filled 2016-08-12: qty 1

## 2016-08-12 MED ORDER — DICLOFENAC SODIUM 50 MG PO TBEC: 50.0000 mg | DELAYED_RELEASE_TABLET | Freq: Two times a day (BID) | ORAL | 0 refills | Status: DC

## 2016-08-12 NOTE — ED Triage Notes (Addendum)
Pt confirms EMS report
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
## 2016-08-12 NOTE — Discharge Instructions (Signed)
Do not drive while taking the muscle relaxant as it will make you sleepy. Return for worsening symptoms  °

## 2016-08-16 ENCOUNTER — Encounter (HOSPITAL_COMMUNITY): Payer: Self-pay | Admitting: Emergency Medicine

## 2016-08-16 ENCOUNTER — Emergency Department (HOSPITAL_COMMUNITY): Payer: No Typology Code available for payment source

## 2016-08-16 ENCOUNTER — Emergency Department (HOSPITAL_COMMUNITY)
Admission: EM | Admit: 2016-08-16 | Discharge: 2016-08-16 | Disposition: A | Discharge status: Home / Self Care | Payer: No Typology Code available for payment source | Attending: Emergency Medicine | Admitting: Emergency Medicine

## 2016-08-16 DIAGNOSIS — Z113 Encounter for screening for infections with a predominantly sexual mode of transmission: Secondary | ICD-10-CM | POA: Diagnosis not present

## 2016-08-16 DIAGNOSIS — Y939 Activity, unspecified: Secondary | ICD-10-CM | POA: Insufficient documentation

## 2016-08-16 DIAGNOSIS — M545 Low back pain, unspecified: Secondary | ICD-10-CM

## 2016-08-16 DIAGNOSIS — S3992XA Unspecified injury of lower back, initial encounter: Secondary | ICD-10-CM | POA: Insufficient documentation

## 2016-08-16 DIAGNOSIS — Z7722 Contact with and (suspected) exposure to environmental tobacco smoke (acute) (chronic): Secondary | ICD-10-CM | POA: Diagnosis not present

## 2016-08-16 DIAGNOSIS — Y999 Unspecified external cause status: Secondary | ICD-10-CM | POA: Insufficient documentation

## 2016-08-16 DIAGNOSIS — Z711 Person with feared health complaint in whom no diagnosis is made: Secondary | ICD-10-CM

## 2016-08-16 DIAGNOSIS — Y92410 Unspecified street and highway as the place of occurrence of the external cause: Secondary | ICD-10-CM | POA: Insufficient documentation

## 2016-08-16 LAB — URINE MICROSCOPIC-ADD ON

## 2016-08-16 LAB — WET PREP, GENITAL
CLUE CELLS WET PREP: NONE SEEN
Sperm: NONE SEEN
TRICH WET PREP: NONE SEEN
Yeast Wet Prep HPF POC: NONE SEEN

## 2016-08-16 LAB — I-STAT BETA HCG BLOOD, ED (MC, WL, AP ONLY)

## 2016-08-16 LAB — URINALYSIS, ROUTINE W REFLEX MICROSCOPIC
BILIRUBIN URINE: NEGATIVE
Glucose, UA: NEGATIVE mg/dL
Hgb urine dipstick: NEGATIVE
KETONES UR: NEGATIVE mg/dL
NITRITE: NEGATIVE
PH: 6 (ref 5.0–8.0)
PROTEIN: NEGATIVE mg/dL
Specific Gravity, Urine: 1.029 (ref 1.005–1.030)

## 2016-08-16 MED ORDER — CEFTRIAXONE SODIUM 250 MG IJ SOLR
250.0000 mg | Freq: Once | INTRAMUSCULAR | Status: AC
  Administered 2016-08-16: 250 mg via INTRAMUSCULAR
  Filled 2016-08-16: qty 250

## 2016-08-16 MED ORDER — NAPROXEN 500 MG PO TABS: 500.0000 mg | ORAL_TABLET | Freq: Two times a day (BID) | ORAL | 0 refills | Status: DC

## 2016-08-16 MED ORDER — LIDOCAINE HCL (PF) 1 % IJ SOLN
INTRAMUSCULAR | Status: AC
  Administered 2016-08-16: 5 mL
  Filled 2016-08-16: qty 5

## 2016-08-16 MED ORDER — AZITHROMYCIN 250 MG PO TABS
1000.0000 mg | ORAL_TABLET | Freq: Once | ORAL | Status: AC
  Administered 2016-08-16: 1000 mg via ORAL
  Filled 2016-08-16: qty 4
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
## 2016-08-17 LAB — GC/CHLAMYDIA PROBE AMP (~~LOC~~) NOT AT ARMC
CHLAMYDIA, DNA PROBE: NEGATIVE
NEISSERIA GONORRHEA: NEGATIVE

## 2016-08-17 LAB — HIV ANTIBODY (ROUTINE TESTING W REFLEX): HIV Screen 4th Generation wRfx: NONREACTIVE

## 2016-08-17 LAB — RPR: RPR Ser Ql: NONREACTIVE

## 2016-08-27 ENCOUNTER — Ambulatory Visit (INDEPENDENT_AMBULATORY_CARE_PROVIDER_SITE_OTHER): Payer: Medicaid Other | Admitting: Obstetrics

## 2016-08-27 ENCOUNTER — Other Ambulatory Visit (HOSPITAL_COMMUNITY)
Admission: RE | Admit: 2016-08-27 | Discharge: 2016-08-27 | Disposition: A | Discharge status: Home / Self Care | Payer: Medicaid Other | Source: Ambulatory Visit | Attending: Obstetrics | Admitting: Obstetrics

## 2016-08-27 ENCOUNTER — Encounter: Payer: Self-pay | Admitting: Obstetrics

## 2016-08-27 VITALS — BP 107/73 | HR 61 | Wt 149.0 lb

## 2016-08-27 DIAGNOSIS — N898 Other specified noninflammatory disorders of vagina: Secondary | ICD-10-CM | POA: Diagnosis not present

## 2016-08-27 DIAGNOSIS — Z30013 Encounter for initial prescription of injectable contraceptive: Secondary | ICD-10-CM

## 2016-08-27 DIAGNOSIS — Z124 Encounter for screening for malignant neoplasm of cervix: Secondary | ICD-10-CM

## 2016-08-27 DIAGNOSIS — A549 Gonococcal infection, unspecified: Secondary | ICD-10-CM

## 2016-08-27 DIAGNOSIS — Z01411 Encounter for gynecological examination (general) (routine) with abnormal findings: Secondary | ICD-10-CM

## 2016-08-27 MED ORDER — CEFTRIAXONE SODIUM 250 MG IJ SOLR
250.0000 mg | Freq: Once | INTRAMUSCULAR | Status: AC
  Administered 2016-08-27: 250 mg via INTRAMUSCULAR

## 2016-08-27 MED ORDER — MEDROXYPROGESTERONE ACETATE 150 MG/ML IM SUSP: 150.0000 mg | INTRAMUSCULAR | 3 refills | Status: DC

## 2016-08-27 MED ORDER — CEFTRIAXONE SODIUM 500 MG IJ SOLR: 250.0000 mg | Freq: Once | INTRAMUSCULAR | Status: DC

## 2016-08-28 ENCOUNTER — Encounter: Payer: Self-pay | Admitting: Obstetrics

## 2016-08-28 NOTE — Progress Notes (Signed)
Subjective:        Andrea Jenkins is a 20 y.o. female here for a routine exam.  Current complaints: Malodorous vaginal discharge with irritation.  Has history of GC, treated, and thinks that she may have been re=exposed.  Personal health questionnaire:  Is patient Ashkenazi Jewish, have a family history of breast and/or ovarian cancer: no Is there a family history of uterine cancer diagnosed at age < 1750, gastrointestinal cancer, urinary tract cancer, family member who is a Personnel officerLynch syndrome-associated carrier: no Is the patient overweight and hypertensive, family history of diabetes, personal history of gestational diabetes, preeclampsia or PCOS: no Is patient over 6555, have PCOS,  family history of premature CHD under age 19, diabetes, smoke, have hypertension or peripheral artery disease:  no At any time, has a partner hit, kicked or otherwise hurt or frightened you?: no Over the past 2 weeks, have you felt down, depressed or hopeless?: no Over the past 2 weeks, have you felt little interest or pleasure in doing things?:no   Gynecologic History Patient's last menstrual period was 08/03/2016 (approximate). Contraception: none Last Pap: none. Results were: none Last mammogram: n/a. Results were: n/a  Obstetric History OB History  Gravida Para Term Preterm AB Living  0 0 0 0 0 0  SAB TAB Ectopic Multiple Live Births  0 0 0 0          History reviewed. No pertinent past medical history.  Past Surgical History:  Procedure Laterality Date  . NO PAST SURGERIES       Current Outpatient Prescriptions:  .  cyclobenzaprine (FLEXERIL) 10 MG tablet, Take 1 tablet (10 mg total) by mouth 2 (two) times daily as needed for muscle spasms., Disp: 20 tablet, Rfl: 0 .  naproxen (NAPROSYN) 500 MG tablet, Take 1 tablet (500 mg total) by mouth 2 (two) times daily with a meal., Disp: 30 tablet, Rfl: 0 .  medroxyPROGESTERone (DEPO-PROVERA) 150 MG/ML injection, Inject 1 mL (150 mg total) into the  muscle every 3 (three) months., Disp: 1 mL, Rfl: 3 .  Probiotic Product (PROBIOTIC DAILY PO), Take 1 tablet by mouth once a week. , Disp: , Rfl:  No Known Allergies  Social History  Substance Use Topics  . Smoking status: Current Every Day Smoker    Packs/day: 0.25    Types: Cigars  . Smokeless tobacco: Never Used  . Alcohol use No     Comment: occasional    Family History  Problem Relation Age of Onset  . Hypertension Maternal Grandmother   . Cancer Neg Hx   . Heart disease Neg Hx   . Diabetes Neg Hx       Review of Systems  Constitutional: negative for fatigue and weight loss Respiratory: negative for cough and wheezing Cardiovascular: negative for chest pain, fatigue and palpitations Gastrointestinal: negative for abdominal pain and change in bowel habits Musculoskeletal:negative for myalgias Neurological: negative for gait problems and tremors Behavioral/Psych: negative for abusive relationship, depression Endocrine: negative for temperature intolerance   Genitourinary:positive for vaginal discharge Integument/breast: negative for breast lump, breast tenderness, nipple discharge and skin lesion(s)    Objective:       BP 107/73   Pulse 61   Wt 149 lb (67.6 kg)   LMP 08/03/2016 (Approximate)   BMI 26.39 kg/m  General:   alert  Skin:   no rash or abnormalities  Lungs:   clear to auscultation bilaterally  Heart:   regular rate and rhythm, S1, S2 normal, no  murmur, click, rub or gallop  Breasts:   normal without suspicious masses, skin or nipple changes or axillary nodes  Abdomen:  normal findings: no organomegaly, soft, non-tender and no hernia  Pelvis:  External genitalia: normal general appearance Urinary system: urethral meatus normal and bladder without fullness, nontender Vaginal: normal without tenderness, induration or masses Cervix: normal appearance Adnexa: normal bimanual exam Uterus: anteverted and non-tender, normal size   Lab Review Urine  pregnancy test Labs reviewed yes Radiologic studies reviewed no  50% of 20 min visit spent on counseling and coordination of care.   Assessment:    Healthy female exam.    History of positive GC  Re-exposure to Andrea Jenkins, Andrea Jenkins  Contraceptive counseling and advice.  Wants Depo Provera.   Plan:    Rocephin Rx  Depo Provera Rx  Education reviewed: low fat, low cholesterol diet, safe sex/STD prevention, smoking cessation and weight bearing exercise. Contraception: Depo-Provera injections. Follow up in: 1 year.   Meds ordered this encounter  Medications  . medroxyPROGESTERone (DEPO-PROVERA) 150 MG/ML injection    Sig: Inject 1 mL (150 mg total) into the muscle every 3 (three) months.    Dispense:  1 mL    Refill:  3  . DISCONTD: cefTRIAXone (ROCEPHIN) injection 250 mg  . cefTRIAXone (ROCEPHIN) injection 250 mg   No orders of the defined types were placed in this encounter.

## 2016-08-31 ENCOUNTER — Other Ambulatory Visit: Payer: Self-pay | Admitting: Obstetrics

## 2016-08-31 ENCOUNTER — Emergency Department (HOSPITAL_COMMUNITY)
Admission: EM | Admit: 2016-08-31 | Discharge: 2016-08-31 | Disposition: A | Discharge status: Home / Self Care | Payer: Medicaid Other | Attending: Emergency Medicine | Admitting: Emergency Medicine

## 2016-08-31 ENCOUNTER — Encounter (HOSPITAL_COMMUNITY): Payer: Self-pay | Admitting: Family Medicine

## 2016-08-31 DIAGNOSIS — F17290 Nicotine dependence, other tobacco product, uncomplicated: Secondary | ICD-10-CM | POA: Insufficient documentation

## 2016-08-31 DIAGNOSIS — N898 Other specified noninflammatory disorders of vagina: Secondary | ICD-10-CM | POA: Diagnosis not present

## 2016-08-31 DIAGNOSIS — A64 Unspecified sexually transmitted disease: Secondary | ICD-10-CM | POA: Diagnosis present

## 2016-08-31 HISTORY — DX: Gonococcal infection, unspecified: A54.9

## 2016-08-31 LAB — CYTOLOGY - PAP

## 2016-08-31 MED ORDER — FLUCONAZOLE 150 MG PO TABS
150.0000 mg | ORAL_TABLET | Freq: Once | ORAL | Status: AC
  Administered 2016-08-31: 150 mg via ORAL
  Filled 2016-08-31: qty 1

## 2016-08-31 NOTE — ED Triage Notes (Signed)
Pt states she was exposed to a STD Suzan Nailer(Gonarrhea) about one month ago. Pt reports she went to a clinic and was tested positive. However, she was not treated and unknown why she was not treated. Pt reports she has a vaginal discharge. Denies abd pain.

## 2016-08-31 NOTE — ED Provider Notes (Signed)
[facility]-EMERGENCY DEPT Provider Note   CSN: [id number] Arrival date & time: 08/31/16  0055     History   Chief Complaint Chief Complaint  Patient presents with  . SEXUALLY TRANSMITTED DISEASE    HPI Andrea Jenkins is a 20 y.o. female.  Patient is 20 yo F with PMH of previous GC and BV infections, presenting with chief complaint of "white clumpy" vaginal discharge. States she is unsure of when discharge first started. She is concerned she may have STD, and [facility] but doesn't think she was treated. Note from visit reports she received 250 Rocephin IM. Also, seen in ED on 10/23 for concern about STD exposure, but testing negative for pregnancy, G/C, BV, trichomonas, yeast infection, HIV, and syphilis. States she was last sexually active 3.5 weeks ago. Denies any fevers, chills, abdominal pain, N/V/D, dysuria, or vaginal bleeding.      Past Medical History:  Diagnosis Date  . Gonorrhea     Patient Active Problem List   Diagnosis Date Noted  . UTI symptoms 10/03/2014  . BV (bacterial vaginosis) 11/14/2013  . Vaginitis and vulvovaginitis, unspecified 02/07/2013    Past Surgical History:  Procedure Laterality Date  . NO PAST SURGERIES      OB History    Gravida Para Term Preterm AB Living   0 0 0 0 0 0   SAB TAB Ectopic Multiple Live Births   0 0 0 0         Home Medications    Prior to Admission medications   Medication Sig Start Date End Date Taking? Authorizing Provider  cyclobenzaprine (FLEXERIL) 10 MG tablet Take 1 tablet (10 mg total) by mouth 2 (two) times daily as needed for muscle spasms. 08/12/16  Yes Hope Orlene OchM Neese, NP  naproxen (NAPROSYN) 500 MG tablet Take 1 tablet (500 mg total) by mouth 2 (two) times daily with a meal. Patient taking differently: Take 500 mg by mouth 2 (two) times daily as needed for mild pain or moderate pain.  08/16/16  Yes Everlene FarrierWilliam Dansie, PA-C  medroxyPROGESTERone (DEPO-PROVERA) 150 MG/ML injection Inject 1  mL (150 mg total) into the muscle every 3 (three) months. Patient not taking: Reported on 08/31/2016 08/27/16   Brock Badharles A Harper, MD    Family History Family History  Problem Relation Age of Onset  . Hypertension Maternal Grandmother   . Cancer Neg Hx   . Heart disease Neg Hx   . Diabetes Neg Hx     Social History Social History  Substance Use Topics  . Smoking status: Current Every Day Smoker    Packs/day: 0.25    Types: Cigars  . Smokeless tobacco: Never Used  . Alcohol use No     Allergies   Patient has no known allergies.   Review of Systems Review of Systems  Constitutional: Negative for chills and fever.  Gastrointestinal: Negative for abdominal pain, nausea and vomiting.  Genitourinary: Positive for vaginal discharge. Negative for dysuria, flank pain, hematuria, pelvic pain and vaginal bleeding.  Musculoskeletal: Negative for back pain.  Skin: Negative for color change and rash.  Neurological: Negative for headaches.     Physical Exam Updated Vital Signs BP 119/56 (BP Location: Left Arm)   Pulse 71   Temp 98.7 F (37.1 C) (Oral)   Resp 14   Ht 5\' 4"  (1.626 m)   Wt 67.6 kg   LMP 08/03/2016 (Approximate)   SpO2 99%   BMI 25.58 kg/m   Physical  Exam  Constitutional: She appears well-developed and well-nourished. No distress.  HENT:  Head: Normocephalic and atraumatic.  Mouth/Throat: Oropharynx is clear and moist.  Eyes: Conjunctivae are normal.  Neck: Normal range of motion.  Cardiovascular: Normal rate.   Pulmonary/Chest: Effort normal. No respiratory distress.  Abdominal: Soft. She exhibits no distension. There is no tenderness. There is no guarding.  Genitourinary:  Genitourinary Comments: Patient refused pelvic exam.  Musculoskeletal: Normal range of motion.  Neurological: She is alert.  Skin: Skin is warm and dry.  Psychiatric: She has a normal mood and affect.  Nursing note and vitals reviewed.    ED Treatments / Results  Labs (all  labs ordered are listed, but only abnormal results are displayed) Labs Reviewed - No data to display  EKG  EKG Interpretation None       Radiology No results found.  Procedures Procedures (including critical care time)  Medications Ordered in ED Medications  fluconazole (DIFLUCAN) tablet 150 mg (150 mg Oral Given 08/31/16 [id number])     Initial Impression / Assessment and Plan / ED Course  I have reviewed the triage vital signs and the nursing notes.  Pertinent labs & imaging results that were available during my care of the patient were reviewed by me and considered in my medical decision making (see chart for details).  Clinical Course    Patient is 20 yo F presenting with chief complaint of "white clumpy" vaginal discharge. She is concerned about STD exposure, [facility] but doesn't think she was treated. However, at that visit she received 250 mg IM Rocephinwas. Also seen in ED on 10/23 for similar complaint about STD exposure, testing negative for pregnancy, G/C, BV, trichomonas, yeast infection, HIV, and syphilis. Last sexually active 3.5 weeks ago. Denies any fevers, chills, abdominal pain, N/V/D, dysuria, or vaginal bleeding. Patient refused pelvic exam, and simply wanted to be treated with d/c home. Per discussion with attending Dr. Bebe ShaggyWickline, no further testing necessary given negative results on 10/23, and patient stable for d/c home with empiric treatment for candidiasis. Given 150 mg PO Diflucan. Notified of results from previous ED visit and educated on safe sex practices. Advised to f/u with PCP and OBGYN for reevaluation, or return to ED for fever, abdominal pain, vomiting, dysuria, or persistent vaginal discharge.   Final Clinical Impressions(s) / ED Diagnoses   Final diagnoses:  Vaginal discharge    New Prescriptions New Prescriptions   No medications on file     Jari PiggDaryl F de Villier II, [place]PA 08/31/16 [id number]    Zadie Rhineonald Wickline, MD 08/31/16  [phone]

## 2016-08-31 NOTE — Discharge Instructions (Signed)
You were seen in ED on 10/23 and tested negative for  gonorrhea, chlamydia, bacterial vaginosis, trichomonas, HIV and syphilis. Also, you are not pregnant. You were treated again for gonorrhea/chlamydia on 11/3. Based on your description of white clumpy discharge, you may have a yeast infection, but we cannot confirm that without pelvic exam and testing. Nevertheless, you were treated today for a possible yeast infection. Please follow up with your PCP or [facility] to establish care with regular OBGYN and for reevaluation if symptoms persist. Practice safe sex and use condoms to reduce risk of STDs.

## 2016-09-02 ENCOUNTER — Other Ambulatory Visit: Payer: Self-pay | Admitting: Obstetrics

## 2016-09-02 DIAGNOSIS — B9689 Other specified bacterial agents as the cause of diseases classified elsewhere: Secondary | ICD-10-CM

## 2016-09-02 DIAGNOSIS — N760 Acute vaginitis: Principal | ICD-10-CM

## 2016-09-02 LAB — NUSWAB VG+, CANDIDA 6SP
ATOPOBIUM VAGINAE: HIGH {score} — AB
CANDIDA KRUSEI, NAA: NEGATIVE
CANDIDA LUSITANIAE, NAA: NEGATIVE
CANDIDA PARAPSILOSIS, NAA: NEGATIVE
CHLAMYDIA TRACHOMATIS, NAA: NEGATIVE
Candida albicans, NAA: NEGATIVE
Candida glabrata, NAA: NEGATIVE
Candida tropicalis, NAA: NEGATIVE
Neisseria gonorrhoeae, NAA: NEGATIVE
TRICH VAG BY NAA: NEGATIVE

## 2016-09-02 MED ORDER — METRONIDAZOLE 500 MG PO TABS: 500.0000 mg | ORAL_TABLET | Freq: Two times a day (BID) | ORAL | 2 refills | Status: DC

## 2016-09-20 ENCOUNTER — Encounter: Payer: Medicaid Other | Admitting: Obstetrics

## 2016-09-30 ENCOUNTER — Telehealth: Payer: Self-pay

## 2016-09-30 NOTE — Telephone Encounter (Signed)
Routed to provider for further review

## 2016-09-30 NOTE — Telephone Encounter (Signed)
Patient called in, stating that medications prescribed for BV have not been working and she needs something else.

## 2016-10-01 ENCOUNTER — Telehealth: Payer: Self-pay

## 2016-10-01 ENCOUNTER — Other Ambulatory Visit: Payer: Self-pay | Admitting: Obstetrics

## 2016-10-01 DIAGNOSIS — B9689 Other specified bacterial agents as the cause of diseases classified elsewhere: Secondary | ICD-10-CM

## 2016-10-01 DIAGNOSIS — N760 Acute vaginitis: Principal | ICD-10-CM

## 2016-10-01 MED ORDER — CLINDAMYCIN HCL 300 MG PO CAPS: 300.0000 mg | ORAL_CAPSULE | Freq: Three times a day (TID) | ORAL | 0 refills | Status: DC

## 2016-10-01 NOTE — Telephone Encounter (Signed)
Clindamycin Rx for BV 

## 2016-10-01 NOTE — Telephone Encounter (Signed)
Contacted patient and advised of rx sent by provider.

## 2016-10-15 ENCOUNTER — Telehealth: Payer: Self-pay

## 2016-10-15 ENCOUNTER — Other Ambulatory Visit: Payer: Self-pay | Admitting: Certified Nurse Midwife

## 2016-10-15 DIAGNOSIS — B9689 Other specified bacterial agents as the cause of diseases classified elsewhere: Secondary | ICD-10-CM

## 2016-10-15 DIAGNOSIS — N760 Acute vaginitis: Principal | ICD-10-CM

## 2016-10-15 MED ORDER — CLINDAMYCIN PHOSPHATE 2 % VA CREA: 1.0000 | TOPICAL_CREAM | Freq: Every day | VAGINAL | 1 refills | Status: DC

## 2016-10-15 NOTE — Telephone Encounter (Signed)
Please let her know that cleocin vaginal cream has been sent to the pharmacy.  Thank you. R.Denney CNM

## 2016-10-15 NOTE — Telephone Encounter (Signed)
Patient called in stating that the new rx prescribed did not work and she is still having thick vaginal discharge, routed to provider for review.

## 2016-10-15 NOTE — Telephone Encounter (Signed)
Contacted pt and advised that rx was sent by provider.

## 2016-10-20 ENCOUNTER — Encounter: Payer: Self-pay | Admitting: *Deleted

## 2016-10-20 ENCOUNTER — Ambulatory Visit (INDEPENDENT_AMBULATORY_CARE_PROVIDER_SITE_OTHER): Payer: Medicaid Other | Admitting: Obstetrics

## 2016-10-20 ENCOUNTER — Encounter: Payer: Self-pay | Admitting: Obstetrics

## 2016-10-20 ENCOUNTER — Other Ambulatory Visit: Payer: Self-pay | Admitting: Obstetrics

## 2016-10-20 DIAGNOSIS — R87612 Low grade squamous intraepithelial lesion on cytologic smear of cervix (LGSIL): Secondary | ICD-10-CM | POA: Diagnosis not present

## 2016-10-20 NOTE — Progress Notes (Signed)
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 The prior pap showed n/a.  Prior cervical/vaginal disease: none. Prior cervical treatment: no treatment.  Procedure Details  The risks and benefits of the procedure and Written informed consent obtained.  A time-out was performed confirming the patient, procedure and allergy status  Speculum placed in vagina and excellent visualization of cervix achieved, cervix swabbed x 3 with acetic acid solution.  Findings: Cervix: no visible lesions; SCJ visualized 360 degrees without lesions, endocervical curettage performed, cervical biopsies taken at 6 and 12 o'clock, specimen labelled and sent to pathology and hemostasis achieved with silver nitrate.   Vaginal inspection: normal without visible lesions. Vulvar colposcopy: vulvar colposcopy not performed.   Physical Exam   Specimens: ECC and Cervical Biopsies  Complications: none.  Plan: Specimens labelled and sent to Pathology. Will base further treatment on Pathology findings. Treatment options discussed with patient. Post biopsy instructions given to patient. Return to discuss Pathology results in 2 weeks.

## 2016-10-26 ENCOUNTER — Telehealth: Payer: Self-pay | Admitting: *Deleted

## 2016-10-26 NOTE — Telephone Encounter (Signed)
Pt called to office stating she was still having some bleeding after colpo procedure last week. Pt states that she was advised to not have intercourse for 1 week post procedure. Pt states that she did have intercourse last night. Pt states that bleeding has not increased but the same.  Pt advised that intercourse can disrupt the healing process of cervix. Pt was advised to not have intercourse for another week in improve healing. Pt advised to call office if bleeding is worse or she feels she needs an exam. Pt states understanding.

## 2016-11-01 ENCOUNTER — Telehealth: Payer: Self-pay

## 2016-11-01 NOTE — Telephone Encounter (Signed)
TC from pt states she is not getting relief using Clindamycin vag cream and is requesting a different rx. She has thick white discharge with inner/outer vaginal irritation. Message sent to provider for recommendation.

## 2016-11-03 ENCOUNTER — Other Ambulatory Visit: Payer: Self-pay | Admitting: Certified Nurse Midwife

## 2016-11-03 DIAGNOSIS — B373 Candidiasis of vulva and vagina: Secondary | ICD-10-CM

## 2016-11-03 DIAGNOSIS — B3731 Acute candidiasis of vulva and vagina: Secondary | ICD-10-CM

## 2016-11-03 MED ORDER — FLUCONAZOLE 200 MG PO TABS: 200.0000 mg | ORAL_TABLET | Freq: Once | ORAL | 0 refills | Status: AC

## 2016-11-03 MED ORDER — TERCONAZOLE 0.8 % VA CREA: 1.0000 | TOPICAL_CREAM | Freq: Every day | VAGINAL | 0 refills | Status: DC

## 2016-11-03 NOTE — Telephone Encounter (Signed)
Pt is aware.  

## 2016-11-03 NOTE — Telephone Encounter (Signed)
-----   Message from Roe Coombsachelle A Denney, CNM sent at 11/03/2016  8:36 AM EST ----- Contact: [phone] Please let her know that she has a yeast infection from the antibiotic. Please let her know that she has a yeast infection.  Diflucan and Terconazole vaginal cream was sent to the pharmacy for her to use.  Thank you.   ----- Message ----- From: Lewayne Buntingyvona Jasreet Dickie, CMA Sent: 11/01/2016  11:28 AM To: Roe Coombsachelle A Denney, CNM  Pt said she's been using Clindamycin for couple weeks and still having inner and outer vaginal irritation with thick white discharge. Denies odor. Please advise, thank you.

## 2016-11-04 ENCOUNTER — Ambulatory Visit: Payer: Medicaid Other | Admitting: Obstetrics

## 2016-11-05 ENCOUNTER — Telehealth (HOSPITAL_COMMUNITY): Payer: Self-pay | Admitting: *Deleted

## 2016-11-05 NOTE — Telephone Encounter (Signed)
Patient called and left voicemail to call her back. Called patient back and she was given [facility]. Patient had an abnormal Pap smear with a colposcopy completed for follow up and needs to have cryo completed. Let patient know she is not eligible for [facility] due to being younger than 4821. Also, let her know that patients need to be seen in [facility] prior to colposcopy being completed to be eligible. Told patient that she can check with her doctor's office to see if they have any financial assistance options. If not, recommended for [facility] to [facility] and apply for financial assistance. Patient verbalized understanding.

## 2016-11-15 ENCOUNTER — Telehealth: Payer: Self-pay | Admitting: *Deleted

## 2016-11-15 NOTE — Telephone Encounter (Signed)
Pt called to office asking for return call.  Return call to pt. Pt states that she currently has on [employer] and cannot get her cryo. Pt states that she does not qualify for program at hospital for assistance due to age. Pt advised to contact case worker and she if she can have her Medicaid changed in order to get full coverage and we can then schedule her procedure. Pt to call office if status changes.

## 2016-11-16 ENCOUNTER — Encounter (HOSPITAL_COMMUNITY): Payer: Self-pay

## 2016-11-16 ENCOUNTER — Encounter (HOSPITAL_COMMUNITY): Payer: Self-pay | Admitting: Emergency Medicine

## 2016-11-16 ENCOUNTER — Emergency Department (HOSPITAL_COMMUNITY)
Admission: EM | Admit: 2016-11-16 | Discharge: 2016-11-16 | Disposition: A | Discharge status: Home / Self Care | Payer: Medicaid Other | Attending: Dermatology | Admitting: Dermatology

## 2016-11-16 ENCOUNTER — Emergency Department (HOSPITAL_COMMUNITY)
Admission: EM | Admit: 2016-11-16 | Discharge: 2016-11-16 | Disposition: A | Discharge status: Home / Self Care | Payer: Medicaid Other | Attending: Emergency Medicine | Admitting: Emergency Medicine

## 2016-11-16 DIAGNOSIS — Z5321 Procedure and treatment not carried out due to patient leaving prior to being seen by health care provider: Secondary | ICD-10-CM | POA: Insufficient documentation

## 2016-11-16 DIAGNOSIS — R112 Nausea with vomiting, unspecified: Secondary | ICD-10-CM | POA: Insufficient documentation

## 2016-11-16 DIAGNOSIS — R197 Diarrhea, unspecified: Secondary | ICD-10-CM | POA: Insufficient documentation

## 2016-11-16 DIAGNOSIS — F17290 Nicotine dependence, other tobacco product, uncomplicated: Secondary | ICD-10-CM | POA: Insufficient documentation

## 2016-11-16 DIAGNOSIS — R1110 Vomiting, unspecified: Secondary | ICD-10-CM | POA: Insufficient documentation

## 2016-11-16 LAB — CBC
HEMATOCRIT: 41.1 % (ref 36.0–46.0)
HEMOGLOBIN: 13.5 g/dL (ref 12.0–15.0)
MCH: 28.4 pg (ref 26.0–34.0)
MCHC: 32.8 g/dL (ref 30.0–36.0)
MCV: 86.3 fL (ref 78.0–100.0)
Platelets: 189 10*3/uL (ref 150–400)
RBC: 4.76 MIL/uL (ref 3.87–5.11)
RDW: 13.1 % (ref 11.5–15.5)
WBC: 8.2 10*3/uL (ref 4.0–10.5)

## 2016-11-16 LAB — COMPREHENSIVE METABOLIC PANEL
ALT: 25 U/L (ref 14–54)
ANION GAP: 10 (ref 5–15)
AST: 28 U/L (ref 15–41)
Albumin: 4.8 g/dL (ref 3.5–5.0)
Alkaline Phosphatase: 42 U/L (ref 38–126)
BUN: 8 mg/dL (ref 6–20)
CHLORIDE: 105 mmol/L (ref 101–111)
CO2: 24 mmol/L (ref 22–32)
Calcium: 10.2 mg/dL (ref 8.9–10.3)
Creatinine, Ser: 0.92 mg/dL (ref 0.44–1.00)
GFR calc non Af Amer: >60 mL/min (ref >60)
Glucose, Bld: 111 mg/dL — ABNORMAL HIGH (ref 65–99)
POTASSIUM: 3.4 mmol/L — AB (ref 3.5–5.1)
SODIUM: 139 mmol/L (ref 135–145)
Total Bilirubin: 0.6 mg/dL (ref 0.3–1.2)
Total Protein: 8.1 g/dL (ref 6.5–8.1)

## 2016-11-16 LAB — URINALYSIS, ROUTINE W REFLEX MICROSCOPIC
GLUCOSE, UA: 100 mg/dL — AB
HGB URINE DIPSTICK: NEGATIVE
Ketones, ur: 15 mg/dL — AB
Nitrite: NEGATIVE
Protein, ur: 100 mg/dL — AB
pH: 5.5 (ref 5.0–8.0)

## 2016-11-16 LAB — URINALYSIS, MICROSCOPIC (REFLEX)

## 2016-11-16 LAB — I-STAT BETA HCG BLOOD, ED (MC, WL, AP ONLY)

## 2016-11-16 LAB — LIPASE, BLOOD: LIPASE: 22 U/L (ref 11–51)

## 2016-11-16 NOTE — ED Notes (Signed)
Pt is leaving the ER.

## 2016-11-16 NOTE — ED Triage Notes (Signed)
Patient reports emesis and diarrhea onset this morning with mild chills , she was here this morning but left without being seen by EDP after triage , blood and urine tests done this morning .

## 2016-11-16 NOTE — ED Triage Notes (Signed)
Pt presents to ED for assessment of nausea, vomiting, and diarrhea all starting this morning approx 2am.  Pt also c/o of some intermittent stomach pain.

## 2016-11-16 NOTE — ED Notes (Signed)
Pt came to nurse first to advise she is leaving.  

## 2016-11-16 NOTE — ED Notes (Signed)
Patient called in main ED waiting room wiith no answer by Thayer Ohmhris, EMT

## 2016-11-16 NOTE — ED Notes (Signed)
Warm blanket given

## 2016-12-07 ENCOUNTER — Ambulatory Visit (INDEPENDENT_AMBULATORY_CARE_PROVIDER_SITE_OTHER): Payer: Self-pay | Admitting: Obstetrics

## 2016-12-07 ENCOUNTER — Encounter: Payer: Self-pay | Admitting: Obstetrics

## 2016-12-07 VITALS — BP 122/80 | HR 76 | Wt 146.6 lb

## 2016-12-07 DIAGNOSIS — Z3202 Encounter for pregnancy test, result negative: Secondary | ICD-10-CM

## 2016-12-07 DIAGNOSIS — Z01811 Encounter for preprocedural respiratory examination: Secondary | ICD-10-CM

## 2016-12-07 LAB — POCT URINE PREGNANCY: Preg Test, Ur: NEGATIVE

## 2016-12-07 NOTE — Progress Notes (Signed)
Patient is in the office for cryo treatment for abnormal pap. Patient had intercourse yesterday- without protection.

## 2017-02-08 ENCOUNTER — Encounter (HOSPITAL_COMMUNITY): Payer: Self-pay | Admitting: Emergency Medicine

## 2017-02-08 ENCOUNTER — Emergency Department (HOSPITAL_COMMUNITY)
Admission: EM | Admit: 2017-02-08 | Discharge: 2017-02-08 | Disposition: A | Discharge status: Home / Self Care | Payer: Medicaid Other | Attending: Dermatology | Admitting: Dermatology

## 2017-02-08 DIAGNOSIS — Z5321 Procedure and treatment not carried out due to patient leaving prior to being seen by health care provider: Secondary | ICD-10-CM | POA: Insufficient documentation

## 2017-02-08 DIAGNOSIS — F17290 Nicotine dependence, other tobacco product, uncomplicated: Secondary | ICD-10-CM | POA: Insufficient documentation

## 2017-02-08 DIAGNOSIS — H5711 Ocular pain, right eye: Secondary | ICD-10-CM | POA: Insufficient documentation

## 2017-02-08 NOTE — ED Triage Notes (Signed)
Pt sts shot with paintball gun in right eye today from unknown distance; pt sts unable to open eye and swelling noted with tearing

## 2017-02-08 NOTE — ED Notes (Signed)
Pt seen outside of waiting room outside. This RN went to ask pt if she could come back into waiting area.  and patient left without informing this RN.

## 2017-02-09 ENCOUNTER — Encounter (HOSPITAL_COMMUNITY): Payer: Self-pay

## 2017-02-09 ENCOUNTER — Emergency Department (HOSPITAL_COMMUNITY)
Admission: EM | Admit: 2017-02-09 | Discharge: 2017-02-09 | Disposition: A | Discharge status: Home / Self Care | Payer: Medicaid Other | Attending: Emergency Medicine | Admitting: Emergency Medicine

## 2017-02-09 DIAGNOSIS — Y999 Unspecified external cause status: Secondary | ICD-10-CM | POA: Insufficient documentation

## 2017-02-09 DIAGNOSIS — S0591XA Unspecified injury of right eye and orbit, initial encounter: Secondary | ICD-10-CM

## 2017-02-09 DIAGNOSIS — Z79899 Other long term (current) drug therapy: Secondary | ICD-10-CM | POA: Insufficient documentation

## 2017-02-09 DIAGNOSIS — Y939 Activity, unspecified: Secondary | ICD-10-CM | POA: Insufficient documentation

## 2017-02-09 DIAGNOSIS — Y929 Unspecified place or not applicable: Secondary | ICD-10-CM | POA: Insufficient documentation

## 2017-02-09 DIAGNOSIS — W228XXA Striking against or struck by other objects, initial encounter: Secondary | ICD-10-CM | POA: Insufficient documentation

## 2017-02-09 DIAGNOSIS — F17290 Nicotine dependence, other tobacco product, uncomplicated: Secondary | ICD-10-CM | POA: Insufficient documentation

## 2017-02-09 NOTE — ED Triage Notes (Addendum)
Pt states she was hit in the right eye with a paintball gun. She saw an opthamologist today and was told to come here. Pt unsure of why they sent her here but states she could possibly lose her vision without surgery.

## 2017-02-09 NOTE — ED Provider Notes (Signed)
[facility]ENCY DEPT Provider Note   CSN: [id number] Arrival date & time: 02/09/17  1409     History   Chief Complaint Chief Complaint  Patient presents with  . Eye Injury    HPI Andrea Jenkins is a 21 y.o. female.Patient was struck in her right eye yesterday by a paintball. No other injury. Complains of mild pain in her right eye nonradiating and decreased vision in her right eye since the event. Nothing makes symptoms better or worse. No other associated symptoms. No treatment prior to coming here she was evaluated by an optometrist earlier today and sent here for further evaluation.  HPI  Past Medical History:  Diagnosis Date  . Gonorrhea     Patient Active Problem List   Diagnosis Date Noted  . 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 UTI symptoms 10/03/2014  . BV (bacterial vaginosis) 11/14/2013  . Vaginitis and vulvovaginitis, unspecified 02/07/2013    Past Surgical History:  Procedure Laterality Date  . NO PAST SURGERIES      OB History    Gravida Para Term Preterm AB Living   0 0 0 0 0 0   SAB TAB Ectopic Multiple Live Births   0 0 0 0         Home Medications    Prior to Admission medications   Medication Sig Start Date End Date Taking? Authorizing Provider  cyclobenzaprine (FLEXERIL) 10 MG tablet Take 1 tablet (10 mg total) by mouth 2 (two) times daily as needed for muscle spasms. Patient not taking: Reported on 10/20/2016 08/12/16   Janne Napoleon, NP  medroxyPROGESTERone (DEPO-PROVERA) 150 MG/ML injection Inject 1 mL (150 mg total) into the muscle every 3 (three) months. Patient not taking: Reported on 10/20/2016 08/27/16   Brock Bad, MD  naproxen (NAPROSYN) 500 MG tablet Take 1 tablet (500 mg total) by mouth 2 (two) times daily with a meal. Patient not taking: Reported on 10/20/2016 08/16/16   Everlene Farrier, PA-C  terconazole (TERAZOL 3) 0.8 % vaginal cream Place 1 applicator vaginally at bedtime. Patient not taking: Reported on  02/09/2017 11/03/16   Roe Coombs, CNM    Family History Family History  Problem Relation Age of Onset  . Hypertension Maternal Grandmother   . Cancer Neg Hx   . Heart disease Neg Hx   . Diabetes Neg Hx     Social History Social History  Substance Use Topics  . Smoking status: Current Every Day Smoker    Packs/day: 0.25    Types: Cigars  . Smokeless tobacco: Never Used  . Alcohol use No     Allergies   Patient has no known allergies.   Review of Systems Review of Systems  Eyes: Positive for pain, redness and visual disturbance.  All other systems reviewed and are negative.    Physical Exam Updated Vital Signs BP 123/80   Pulse 79   Temp 98.4 F (36.9 C) (Oral)   Resp 18   SpO2 97%   Physical Exam  Constitutional: She is oriented to person, place, and time. She appears well-developed and well-nourished. No distress.  HENT:  Jenkins: Normocephalic and atraumatic.  Left Ear: External ear normal.  Eyes: EOM are normal.  Right eye with subconjunctival erythema. Pupil 3 mm nonreactive. Able to perceive light with right eye. Unable to visualize my hand or to finger count. Left eye pupil 2-3 mm reactive to light. Extraocular muscles intact no pain on extraocular movement  Neck: Normal range  of motion.  Cardiovascular: Normal rate.   Pulmonary/Chest: Effort normal.  Abdominal: She exhibits no distension.  Musculoskeletal: Normal range of motion.  Neurological: She is oriented to person, place, and time. Coordination normal.  Skin: Skin is warm and dry.  Nursing note and vitals reviewed.    ED Treatments / Results  Labs (all labs ordered are listed, but only abnormal results are displayed) Labs Reviewed - No data to display  EKG  EKG Interpretation None       Radiology No results found.  Procedures Procedures (including critical care time)  Medications Ordered in ED Medications - No data to display   Initial Impression / Assessment and Plan /  ED Course  I have reviewed the triage vital signs and the nursing notes.  Pertinent labs & imaging results that were available during my care of the patient were reviewed by me and considered in my medical decision making (see chart for details).     Dr.Lyles, ophthalmologist on call consulted by telephone. He requests patient be transferred to his office immediately for further evaluation. Her mother will drive her there immediately upon leaving this department  Final Clinical Impressions(s) / ED Diagnoses  DxEye injury Final diagnoses:  None    New Prescriptions New Prescriptions   No medications on file     Doug Sou, MD 02/09/17 1641

## 2017-02-09 NOTE — ED Notes (Signed)
Patient states she had a paintball go into her right eye yest. Patient states she irrigated her eye q2 hours 4/5 x's before bed last night. Stating she can't see anything but light. Swelling in eye has improved. Eye reddened. Went to optomologist and said they need retina specialist, which they have an appt with tomorrow. Specialist told them to come to the ED.

## 2017-02-09 NOTE — ED Notes (Signed)
EDP at bedside  

## 2017-05-21 ENCOUNTER — Inpatient Hospital Stay (HOSPITAL_COMMUNITY): Payer: Self-pay

## 2017-05-21 ENCOUNTER — Inpatient Hospital Stay (HOSPITAL_COMMUNITY)
Admission: AD | Admit: 2017-05-21 | Discharge: 2017-05-21 | Disposition: A | Discharge status: Home / Self Care | Payer: Self-pay | Source: Ambulatory Visit | Attending: Obstetrics and Gynecology | Admitting: Obstetrics and Gynecology

## 2017-05-21 ENCOUNTER — Encounter (HOSPITAL_COMMUNITY): Payer: Self-pay | Admitting: *Deleted

## 2017-05-21 DIAGNOSIS — Z809 Family history of malignant neoplasm, unspecified: Secondary | ICD-10-CM | POA: Insufficient documentation

## 2017-05-21 DIAGNOSIS — Z3490 Encounter for supervision of normal pregnancy, unspecified, unspecified trimester: Secondary | ICD-10-CM

## 2017-05-21 DIAGNOSIS — F17290 Nicotine dependence, other tobacco product, uncomplicated: Secondary | ICD-10-CM | POA: Insufficient documentation

## 2017-05-21 DIAGNOSIS — Z3A01 Less than 8 weeks gestation of pregnancy: Secondary | ICD-10-CM | POA: Insufficient documentation

## 2017-05-21 DIAGNOSIS — Z8249 Family history of ischemic heart disease and other diseases of the circulatory system: Secondary | ICD-10-CM | POA: Insufficient documentation

## 2017-05-21 DIAGNOSIS — R102 Pelvic and perineal pain: Secondary | ICD-10-CM | POA: Insufficient documentation

## 2017-05-21 DIAGNOSIS — O26891 Other specified pregnancy related conditions, first trimester: Secondary | ICD-10-CM

## 2017-05-21 DIAGNOSIS — R109 Unspecified abdominal pain: Secondary | ICD-10-CM

## 2017-05-21 DIAGNOSIS — Z8619 Personal history of other infectious and parasitic diseases: Secondary | ICD-10-CM | POA: Insufficient documentation

## 2017-05-21 LAB — URINALYSIS, ROUTINE W REFLEX MICROSCOPIC
Bilirubin Urine: NEGATIVE
Glucose, UA: NEGATIVE mg/dL
Hgb urine dipstick: NEGATIVE
KETONES UR: NEGATIVE mg/dL
LEUKOCYTES UA: NEGATIVE
Nitrite: NEGATIVE
PH: 5 (ref 5.0–8.0)
PROTEIN: NEGATIVE mg/dL
Specific Gravity, Urine: 1.027 (ref 1.005–1.030)

## 2017-05-21 LAB — WET PREP, GENITAL
Sperm: NONE SEEN
TRICH WET PREP: NONE SEEN
Yeast Wet Prep HPF POC: NONE SEEN

## 2017-05-21 LAB — POCT PREGNANCY, URINE: Preg Test, Ur: POSITIVE — AB

## 2017-05-21 NOTE — Discharge Instructions (Signed)
First Trimester of Pregnancy °The first trimester of pregnancy is from week 1 until the end of week 13 (months 1 through 3). A week after a sperm fertilizes an egg, the egg will implant on the wall of the uterus. This embryo will begin to develop into a baby. Genes from you and your partner will form the baby. The female genes will determine whether the baby will be a boy or a girl. At 6-8 weeks, the eyes and face will be formed, and the heartbeat can be seen on ultrasound. At the end of 12 weeks, all the baby's organs will be formed. °Now that you are pregnant, you will want to do everything you can to have a healthy baby. Two of the most important things are to get good prenatal care and to follow your health care provider's instructions. Prenatal care is all the medical care you receive before the baby's birth. This care will help prevent, find, and treat any problems during the pregnancy and childbirth. °Body changes during your first trimester °Your body goes through many changes during pregnancy. The changes vary from woman to woman. °· You may gain or lose a couple of pounds at first. °· You may feel sick to your stomach (nauseous) and you may throw up (vomit). If the vomiting is uncontrollable, call your health care provider. °· You may tire easily. °· You may develop headaches that can be relieved by medicines. All medicines should be approved by your health care provider. °· You may urinate more often. Painful urination may mean you have a bladder infection. °· You may develop heartburn as a result of your pregnancy. °· You may develop constipation because certain hormones are causing the muscles that push stool through your intestines to slow down. °· You may develop hemorrhoids or swollen veins (varicose veins). °· Your breasts may begin to grow larger and become tender. Your nipples may stick out more, and the tissue that surrounds them (areola) may become darker. °· Your gums may bleed and may be  sensitive to brushing and flossing. °· Dark spots or blotches (chloasma, mask of pregnancy) may develop on your face. This will likely fade after the baby is born. °· Your menstrual periods will stop. °· You may have a loss of appetite. °· You may develop cravings for certain kinds of food. °· You may have changes in your emotions from day to day, such as being excited to be pregnant or being concerned that something may go wrong with the pregnancy and baby. °· You may have more vivid and strange dreams. °· You may have changes in your hair. These can include thickening of your hair, rapid growth, and changes in texture. Some women also have hair loss during or after pregnancy, or hair that feels dry or thin. Your hair will most likely return to normal after your baby is born. ° °What to expect at prenatal visits °During a routine prenatal visit: °· You will be weighed to make sure you and the baby are growing normally. °· Your blood pressure will be taken. °· Your abdomen will be measured to track your baby's growth. °· The fetal heartbeat will be listened to between weeks 10 and 14 of your pregnancy. °· Test results from any previous visits will be discussed. ° °Your health care provider may ask you: °· How you are feeling. °· If you are feeling the baby move. °· If you have had any abnormal symptoms, such as leaking fluid, bleeding, severe headaches,   or abdominal cramping. °· If you are using any tobacco products, including cigarettes, chewing tobacco, and electronic cigarettes. °· If you have any questions. ° °Other tests that may be performed during your first trimester include: °· Blood tests to find your blood type and to check for the presence of any previous infections. The tests will also be used to check for low iron levels (anemia) and protein on red blood cells (Rh antibodies). Depending on your risk factors, or if you previously had diabetes during pregnancy, you may have tests to check for high blood  sugar that affects pregnant women (gestational diabetes). °· Urine tests to check for infections, diabetes, or protein in the urine. °· An ultrasound to confirm the proper growth and development of the baby. °· Fetal screens for spinal cord problems (spina bifida) and Down syndrome. °· HIV (human immunodeficiency virus) testing. Routine prenatal testing includes screening for HIV, unless you choose not to have this test. °· You may need other tests to make sure you and the baby are doing well. ° °Follow these instructions at home: °Medicines °· Follow your health care provider's instructions regarding medicine use. Specific medicines may be either safe or unsafe to take during pregnancy. °· Take a prenatal vitamin that contains at least 600 micrograms (mcg) of folic acid. °· If you develop constipation, try taking a stool softener if your health care provider approves. °Eating and drinking °· Eat a balanced diet that includes fresh fruits and vegetables, whole grains, good sources of protein such as meat, eggs, or tofu, and low-fat dairy. Your health care provider will help you determine the amount of weight gain that is right for you. °· Avoid raw meat and uncooked cheese. These carry germs that can cause birth defects in the baby. °· Eating four or five small meals rather than three large meals a day may help relieve nausea and vomiting. If you start to feel nauseous, eating a few soda crackers can be helpful. Drinking liquids between meals, instead of during meals, also seems to help ease nausea and vomiting. °· Limit foods that are high in fat and processed sugars, such as fried and sweet foods. °· To prevent constipation: °? Eat foods that are high in fiber, such as fresh fruits and vegetables, whole grains, and beans. °? Drink enough fluid to keep your urine clear or pale yellow. °Activity °· Exercise only as directed by your health care provider. Most women can continue their usual exercise routine during  pregnancy. Try to exercise for 30 minutes at least 5 days a week. Exercising will help you: °? Control your weight. °? Stay in shape. °? Be prepared for labor and delivery. °· Experiencing pain or cramping in the lower abdomen or lower back is a good sign that you should stop exercising. Check with your health care provider before continuing with normal exercises. °· Try to avoid standing for long periods of time. Move your legs often if you must stand in one place for a long time. °· Avoid heavy lifting. °· Wear low-heeled shoes and practice good posture. °· You may continue to have sex unless your health care provider tells you not to. °Relieving pain and discomfort °· Wear a good support bra to relieve breast tenderness. °· Take warm sitz baths to soothe any pain or discomfort caused by hemorrhoids. Use hemorrhoid cream if your health care provider approves. °· Rest with your legs elevated if you have leg cramps or low back pain. °· If you develop   varicose veins in your legs, wear support hose. Elevate your feet for 15 minutes, 3-4 times a day. Limit salt in your diet. Prenatal care  Schedule your prenatal visits by the twelfth week of pregnancy. They are usually scheduled monthly at first, then more often in the last 2 months before delivery.  Write down your questions. Take them to your prenatal visits.  Keep all your prenatal visits as told by your health care provider. This is important. Safety  Wear your seat belt at all times when driving.  Make a list of emergency phone numbers, including numbers for family, friends, the hospital, and police and fire departments. General instructions  Ask your health care provider for a referral to a local prenatal education class. Begin classes no later than the beginning of month 6 of your pregnancy.  Ask for help if you have counseling or nutritional needs during pregnancy. Your health care provider can offer advice or refer you to specialists for help  with various needs.  Do not use hot tubs, steam rooms, or saunas.  Do not douche or use tampons or scented sanitary pads.  Do not cross your legs for long periods of time.  Avoid cat litter boxes and soil used by cats. These carry germs that can cause birth defects in the baby and possibly loss of the fetus by miscarriage or stillbirth.  Avoid all smoking, herbs, alcohol, and medicines not prescribed by your health care provider. Chemicals in these products affect the formation and growth of the baby.  Do not use any products that contain nicotine or tobacco, such as cigarettes and e-cigarettes. If you need help quitting, ask your health care provider. You may receive counseling support and other resources to help you quit.  Schedule a dentist appointment. At home, brush your teeth with a soft toothbrush and be gentle when you floss. Contact a health care provider if:  You have dizziness.  You have mild pelvic cramps, pelvic pressure, or nagging pain in the abdominal area.  You have persistent nausea, vomiting, or diarrhea.  You have a bad smelling vaginal discharge.  You have pain when you urinate.  You notice increased swelling in your face, hands, legs, or ankles.  You are exposed to fifth disease or chickenpox.  You are exposed to [place] measles (rubella) and have never had it. Get help right away if:  You have a fever.  You are leaking fluid from your vagina.  You have spotting or bleeding from your vagina.  You have severe abdominal cramping or pain.  You have rapid weight gain or loss.  You vomit blood or material that looks like coffee grounds.  You develop a severe headache.  You have shortness of breath.  You have any kind of trauma, such as from a fall or a car accident. Summary  The first trimester of pregnancy is from week 1 until the end of week 13 (months 1 through 3).  Your body goes through many changes during pregnancy. The changes vary from  woman to woman.  You will have routine prenatal visits. During those visits, your health care provider will examine you, discuss any test results you may have, and talk with you about how you are feeling. This information is not intended to replace advice given to you by your health care provider. Make sure you discuss any questions you have with your health care provider. Document Released: 10/05/2001 Document Revised: 09/22/2016 Document Reviewed: 09/22/2016 Elsevier Interactive Patient Education  2017 Else[employer]  [employer] Abdominal Pain During Pregnancy Abdominal pain is common in pregnancy. Most of the time, it does not cause harm. There are many causes of abdominal pain. Some causes are more serious than others and sometimes the cause is not known. Abdominal pain can be a sign that something is very wrong with the pregnancy or the pain may have nothing to do with the pregnancy. Always tell your health care provider if you have any abdominal pain. Follow these instructions at home:  Do not have sex or put anything in your vagina until your symptoms go away completely.  Watch your abdominal pain for any changes.  Get plenty of rest until your pain improves.  Drink enough fluid to keep your urine clear or pale yellow.  Take over-the-counter or prescription medicines only as told by your health care provider.  Keep all follow-up visits as told by your health care provider. This is important. Contact a health care provider if:  You have a fever.  Your pain gets worse or you have cramping.  Your pain continues after resting. Get help right away if:  You are bleeding, leaking fluid, or passing tissue from the vagina.  You have vomiting or diarrhea that does not go away.  You have painful or bloody urination.  You notice a decrease in your baby's movements.  You feel very weak or faint.  You have shortness of breath.  You develop a severe headache with abdominal pain.  You have  abnormal vaginal discharge with abdominal pain. This information is not intended to replace advice given to you by your health care provider. Make sure you discuss any questions you have with your health care provider. Document Released: 10/11/2005 Document Revised: 07/22/2016 Document Reviewed: 05/10/2013 Elsevier Interactive Patient Education  [employer]2018 [employer]

## 2017-05-21 NOTE — MAU Note (Signed)
Pt has been having intermittent lower mid-abdominal pain for past week.  Denies vaginal bleeding or discharge.  LMP 03/27/17.  Took positive HPT today.  No pain right now, but pain had been up to a 6/10.

## 2017-05-21 NOTE — MAU Provider Note (Signed)
History   21 yo G1 LMP 03/27/17 in with c/o intermittent abd pain for the last few days. Denies any pain at the present time.  CSN: [id number]  Arrival date & time 05/21/17  1819   None     Chief Complaint  Patient presents with  . Abdominal Pain    + HPT    HPI  Past Medical History:  Diagnosis Date  . Gonorrhea     Past Surgical History:  Procedure Laterality Date  . NO PAST SURGERIES    . WISDOM TOOTH EXTRACTION      Family History  Problem Relation Age of Onset  . Hypertension Maternal Grandmother   . Cancer Neg Hx   . Heart disease Neg Hx   . Diabetes Neg Hx     Social History  Substance Use Topics  . Smoking status: Current Some Day Smoker    Packs/day: 0.25    Types: Cigars  . Smokeless tobacco: Never Used  . Alcohol use No    OB History    Gravida Para Term Preterm AB Living   1 0 0 0 0 0   SAB TAB Ectopic Multiple Live Births   0 0 0 0        Review of Systems  Constitutional: Negative.   HENT: Negative.   Eyes: Negative.   Respiratory: Negative.   Cardiovascular: Negative.   Gastrointestinal: Positive for abdominal pain.  Endocrine: Negative.   Genitourinary: Negative.   Musculoskeletal: Negative.   Skin: Negative.   Allergic/Immunologic: Negative.   Neurological: Negative.   Hematological: Negative.   Psychiatric/Behavioral: Negative.     Allergies  Patient has no known allergies.  Home Medications    BP (!) 126/59 (BP Location: Left Arm)   Pulse (!) 109   Temp 99 F (37.2 C) (Oral)   Resp 18   Ht 5\' 4"  (1.626 m)   Wt 152 lb (68.9 kg)   LMP 03/27/2017 (Exact Date)   SpO2 100%   BMI 26.09 kg/m   Physical Exam  MAU Course  Procedures (including critical care time)  Labs Reviewed  POCT PREGNANCY, URINE - Abnormal; Notable for the following:       Result Value   Preg Test, Ur POSITIVE (*)    All other components within normal limits  URINALYSIS, ROUTINE W REFLEX MICROSCOPIC   No results found.   1. Abdominal  pain in pregnancy, first trimester       MDM  [place] shows IUP with gestational sac and yolk sac. No bleeding vaginally, wet prep pos clue no symptoms. Will d/c home to f/u with provider of choice.

## 2017-05-23 LAB — GC/CHLAMYDIA PROBE AMP (~~LOC~~) NOT AT ARMC
CHLAMYDIA, DNA PROBE: NEGATIVE
NEISSERIA GONORRHEA: NEGATIVE

## 2017-05-30 ENCOUNTER — Encounter (HOSPITAL_COMMUNITY): Payer: Self-pay

## 2017-05-30 ENCOUNTER — Inpatient Hospital Stay (HOSPITAL_COMMUNITY)
Admission: AD | Admit: 2017-05-30 | Discharge: 2017-05-30 | Disposition: A | Discharge status: Home / Self Care | Payer: Medicaid Other | Source: Ambulatory Visit | Attending: Obstetrics & Gynecology | Admitting: Obstetrics & Gynecology

## 2017-05-30 DIAGNOSIS — R1030 Lower abdominal pain, unspecified: Secondary | ICD-10-CM | POA: Insufficient documentation

## 2017-05-30 DIAGNOSIS — Z3A01 Less than 8 weeks gestation of pregnancy: Secondary | ICD-10-CM | POA: Diagnosis not present

## 2017-05-30 DIAGNOSIS — O99331 Smoking (tobacco) complicating pregnancy, first trimester: Secondary | ICD-10-CM | POA: Diagnosis not present

## 2017-05-30 DIAGNOSIS — F17290 Nicotine dependence, other tobacco product, uncomplicated: Secondary | ICD-10-CM | POA: Diagnosis not present

## 2017-05-30 DIAGNOSIS — O26891 Other specified pregnancy related conditions, first trimester: Secondary | ICD-10-CM | POA: Insufficient documentation

## 2017-05-30 DIAGNOSIS — R109 Unspecified abdominal pain: Secondary | ICD-10-CM

## 2017-05-30 DIAGNOSIS — O26899 Other specified pregnancy related conditions, unspecified trimester: Secondary | ICD-10-CM

## 2017-05-30 LAB — URINALYSIS, ROUTINE W REFLEX MICROSCOPIC
Bilirubin Urine: NEGATIVE
Glucose, UA: NEGATIVE mg/dL
Hgb urine dipstick: NEGATIVE
KETONES UR: NEGATIVE mg/dL
LEUKOCYTES UA: NEGATIVE
NITRITE: NEGATIVE
PROTEIN: NEGATIVE mg/dL
Specific Gravity, Urine: 1.02 (ref 1.005–1.030)
pH: 6 (ref 5.0–8.0)

## 2017-05-30 LAB — CBC
HCT: 36.3 % (ref 36.0–46.0)
HEMOGLOBIN: 12.1 g/dL (ref 12.0–15.0)
MCH: 28.1 pg (ref 26.0–34.0)
MCHC: 33.3 g/dL (ref 30.0–36.0)
MCV: 84.2 fL (ref 78.0–100.0)
Platelets: 178 10*3/uL (ref 150–400)
RBC: 4.31 MIL/uL (ref 3.87–5.11)
RDW: 13 % (ref 11.5–15.5)
WBC: 4.3 10*3/uL (ref 4.0–10.5)

## 2017-05-30 NOTE — MAU Provider Note (Signed)
History    CSN: [id number]  Arrival date and time: 05/30/17 1202  First Provider Initiated Contact with Patient 05/30/17 1223     Chief Complaint  Patient presents with  . Abdominal Pain   HPI Andrea Jenkins is a 21 y.o. G1P0000 at 334w5d who presents with lower abdominal pain. She states the pain comes and goes all across the bottom of her abdomen, occasionaly on the right side. She rates the pain a 4/10 and has not tried anything for the pain. She denies any vaginal bleeding or discharge. States she is just worried about the pregnancy.   OB History    Gravida Para Term Preterm AB Living   1 0 0 0 0 0   SAB TAB Ectopic Multiple Live Births   0 0 0 0        Past Medical History:  Diagnosis Date  . Gonorrhea     Past Surgical History:  Procedure Laterality Date  . NO PAST SURGERIES    . WISDOM TOOTH EXTRACTION      Family History  Problem Relation Age of Onset  . Hypertension Maternal Grandmother   . Cancer Neg Hx   . Heart disease Neg Hx   . Diabetes Neg Hx     Social History  Substance Use Topics  . Smoking status: Current Some Day Smoker    Packs/day: 0.25    Types: Cigars  . Smokeless tobacco: Never Used  . Alcohol use No    Allergies: No Known Allergies  Prescriptions Prior to Admission  Medication Sig Dispense Refill Last Dose  . cyclobenzaprine (FLEXERIL) 10 MG tablet Take 1 tablet (10 mg total) by mouth 2 (two) times daily as needed for muscle spasms. (Patient not taking: Reported on 10/20/2016) 20 tablet 0 Not Taking at Unknown time    Review of Systems  Constitutional: Negative.  Negative for chills and fever.  HENT: Negative.   Respiratory: Negative.  Negative for shortness of breath.   Cardiovascular: Negative.  Negative for chest pain.  Gastrointestinal: Positive for abdominal pain. Negative for constipation, diarrhea, nausea and vomiting.  Genitourinary: Negative.  Negative for dysuria, vaginal bleeding and vaginal discharge.   Neurological: Negative.  Negative for dizziness and headaches.  Psychiatric/Behavioral: Negative.    Physical Exam   Blood pressure 120/64, pulse 78, temperature 98.1 F (36.7 C), temperature source Oral, resp. rate 16, height 5\' 4"  (1.626 m), weight 148 lb (67.1 kg), last menstrual period 03/27/2017.  Physical Exam  Nursing note and vitals reviewed. Constitutional: She is oriented to person, place, and time. She appears well-developed and well-nourished.  HENT:  Head: Normocephalic and atraumatic.  Eyes: Conjunctivae are normal. No scleral icterus.  Cardiovascular: Normal rate, regular rhythm and normal heart sounds.   Respiratory: Effort normal and breath sounds normal. No respiratory distress.  GI: Soft. Bowel sounds are normal. She exhibits no distension. There is no guarding.  Neurological: She is alert and oriented to person, place, and time.  Skin: Skin is warm and dry.  Psychiatric: She has a normal mood and affect. Her behavior is normal. Judgment and thought content normal.    MAU Course  Procedures Results for orders placed or performed during the hospital encounter of 05/30/17 (from the past 24 hour(s))  Urinalysis, Routine w reflex microscopic     Status: Abnormal   Collection Time: 05/30/17 12:15 PM  Result Value Ref Range   Color, Urine YELLOW YELLOW   APPearance HAZY (A) CLEAR   Specific Gravity, Urine 1.020  1.005 - 1.030   pH 6.0 5.0 - 8.0   Glucose, UA NEGATIVE NEGATIVE mg/dL   Hgb urine dipstick NEGATIVE NEGATIVE   Bilirubin Urine NEGATIVE NEGATIVE   Ketones, ur NEGATIVE NEGATIVE mg/dL   Protein, ur NEGATIVE NEGATIVE mg/dL   Nitrite NEGATIVE NEGATIVE   Leukocytes, UA NEGATIVE NEGATIVE  CBC     Status: None   Collection Time: 05/30/17  1:02 PM  Result Value Ref Range   WBC 4.3 4.0 - 10.5 K/uL   RBC 4.31 3.87 - 5.11 MIL/uL   Hemoglobin 12.1 12.0 - 15.0 g/dL   HCT 16.1 09.6 - 04.5 %   MCV 84.2 78.0 - 100.0 fL   MCH 28.1 26.0 - 34.0 pg   MCHC 33.3 30.0  - 36.0 g/dL   RDW 40.9 81.1 - 91.4 %   Platelets 178 150 - 400 K/uL   MDM UA CBC Low suspicion for appendicitis due to location of pain and absence of fever, leukocytosis and GI complaints.  Assessment and Plan   1. Abdominal pain affecting pregnancy   2. [redacted] weeks gestation of pregnancy    -Discharge patient home in stable condition -Reassurance provided for patient, encouraged patient to increase water -Follow up at [facility] as scheduled for prenatal care on 9/10 -Encouraged to return here or to other Urgent Care/ED if she develops worsening of symptoms, increase in pain, fever, or other concerning symptoms.   Cleone Slim SNM 05/30/2017, 1:23 PM   I confirm that I have verified the information documented in the SNM's note and that I have also personally reperformed the physical exam and all medical decision making activities.  No clear etiology for pain Suspect may just be growing uterus No other indicators of overt pathology  Aviva Signs, CNM

## 2017-05-30 NOTE — MAU Note (Signed)
Pt C/O lower abd pain, pressure & cramping.  Had a BM but is still having pain.  Denies bleeding.

## 2017-05-30 NOTE — Discharge Instructions (Signed)

## 2017-06-06 ENCOUNTER — Telehealth: Payer: Self-pay | Admitting: *Deleted

## 2017-06-06 NOTE — Telephone Encounter (Signed)
Patient's mother is calling about her daughter to see if she needs an appointment for cramping. 3:13 VM not set up.

## 2017-06-11 ENCOUNTER — Inpatient Hospital Stay (HOSPITAL_COMMUNITY)
Admission: AD | Admit: 2017-06-11 | Discharge: 2017-06-11 | Disposition: A | Discharge status: Home / Self Care | Payer: Medicaid Other | Source: Ambulatory Visit | Attending: Obstetrics and Gynecology | Admitting: Obstetrics and Gynecology

## 2017-06-11 ENCOUNTER — Encounter (HOSPITAL_COMMUNITY): Payer: Self-pay | Admitting: *Deleted

## 2017-06-11 DIAGNOSIS — R110 Nausea: Secondary | ICD-10-CM | POA: Insufficient documentation

## 2017-06-11 DIAGNOSIS — O26899 Other specified pregnancy related conditions, unspecified trimester: Secondary | ICD-10-CM

## 2017-06-11 DIAGNOSIS — Z87891 Personal history of nicotine dependence: Secondary | ICD-10-CM | POA: Diagnosis not present

## 2017-06-11 DIAGNOSIS — Z3A08 8 weeks gestation of pregnancy: Secondary | ICD-10-CM | POA: Insufficient documentation

## 2017-06-11 DIAGNOSIS — O26891 Other specified pregnancy related conditions, first trimester: Secondary | ICD-10-CM | POA: Diagnosis not present

## 2017-06-11 DIAGNOSIS — R109 Unspecified abdominal pain: Secondary | ICD-10-CM | POA: Diagnosis present

## 2017-06-11 LAB — URINALYSIS, ROUTINE W REFLEX MICROSCOPIC
Bilirubin Urine: NEGATIVE
Glucose, UA: NEGATIVE mg/dL
HGB URINE DIPSTICK: NEGATIVE
Ketones, ur: NEGATIVE mg/dL
LEUKOCYTES UA: NEGATIVE
Nitrite: NEGATIVE
PROTEIN: NEGATIVE mg/dL
SPECIFIC GRAVITY, URINE: 1.018 (ref 1.005–1.030)
pH: 6 (ref 5.0–8.0)

## 2017-06-11 LAB — WET PREP, GENITAL
CLUE CELLS WET PREP: NONE SEEN
SPERM: NONE SEEN
TRICH WET PREP: NONE SEEN
YEAST WET PREP: NONE SEEN

## 2017-06-11 MED ORDER — PROMETHAZINE HCL 12.5 MG PO TABS: 12.5000 mg | ORAL_TABLET | Freq: Four times a day (QID) | ORAL | 0 refills | Status: DC | PRN

## 2017-06-11 NOTE — MAU Provider Note (Signed)
History     CSN: [id number]  Arrival date and time: 06/11/17 1325   First Provider Initiated Contact with Patient 06/11/17 1403      Chief Complaint  Patient presents with  . Abdominal Cramping   Andrea Jenkins is a 21 y.o. G1P0000 at [redacted]w[redacted]d with known IUP of undetermined viability presents with 3 week history of intermittent right groin mild crampy pain. No pain at present. Relief from lying still. Nothing exacerbates her pain. She's had nausea for about 2 weeks with no vomiting but with decreased appetite. No VB since LMP 03/27/17.        Past Medical History:  Diagnosis Date  . Gonorrhea     Past Surgical History:  Procedure Laterality Date  . NO PAST SURGERIES    . WISDOM TOOTH EXTRACTION      Family History  Problem Relation Age of Onset  . Hypertension Maternal Grandmother   . Cancer Neg Hx   . Heart disease Neg Hx   . Diabetes Neg Hx     Social History  Substance Use Topics  . Smoking status: Former Smoker    Packs/day: 0.25    Types: Cigars    Quit date: 03/11/2017  . Smokeless tobacco: Never Used  . Alcohol use No    Allergies: No Known Allergies  Prescriptions Prior to Admission  Medication Sig Dispense Refill Last Dose  . Prenatal Vit-Fe Fumarate-FA (PRENATAL MULTIVITAMIN) TABS tablet Take 1 tablet by mouth daily at 12 noon.   06/11/2017 at Unknown time    Review of Systems  Constitutional: Negative for appetite change and fever.       Decreased appetite due to nausea  HENT: Negative for congestion.   Gastrointestinal: Positive for nausea. Negative for vomiting.  Genitourinary: Positive for frequency. Negative for dysuria, hematuria, pelvic pain, vaginal bleeding and vaginal discharge.  Musculoskeletal: Negative for back pain.  Neurological: Negative for light-headedness.  Psychiatric/Behavioral: The patient is not nervous/anxious.    Physical Exam   Blood pressure (!) 113/58, pulse 84, temperature 98.8 F (37.1 C), temperature source  Oral, resp. rate 17, height 5\' 4"  (1.626 m), weight 148 lb 8 oz (67.4 kg), last menstrual period 03/27/2017, SpO2 100 %.  Physical Exam  Nursing note and vitals reviewed. Constitutional: She is oriented to person, place, and time. She appears well-developed and well-nourished. No distress.  HENT:  Head: Normocephalic.  Eyes: No scleral icterus.  Neck: Neck supple. No thyromegaly present.  Cardiovascular: Normal rate.   Respiratory: Effort normal.  GI: There is tenderness.  Minimal suprapubic TTP   Genitourinary: Vagina normal.  Genitourinary Comments: Cx L/C; Uterus 8-10 wk size NT  Musculoskeletal: Normal range of motion.  Neurological: She is alert and oriented to person, place, and time.  Skin: Skin is warm and dry.  Psychiatric: She has a normal mood and affect. Her behavior is normal.    MAU Course  Procedures   Bedside US by me: Normal fetal cardiac activity observed by me and patient  Results for orders placed or performed during the hospital encounter of 06/11/17 (from the past 24 hour(s))  Urinalysis, Routine w reflex microscopic     Status: Abnormal   Collection Time: 06/11/17  1:30 PM  Result Value Ref Range   Color, Urine YELLOW YELLOW   APPearance HAZY (A) CLEAR   Specific Gravity, Urine 1.018 1.005 - 1.030   pH 6.0 5.0 - 8.0   Glucose, UA NEGATIVE NEGATIVE mg/dL   Hgb urine dipstick NEGATIVE NEGATIVE  Bilirubin Urine NEGATIVE NEGATIVE   Ketones, ur NEGATIVE NEGATIVE mg/dL   Protein, ur NEGATIVE NEGATIVE mg/dL   Nitrite NEGATIVE NEGATIVE   Leukocytes, UA NEGATIVE NEGATIVE  Wet prep, genital     Status: Abnormal   Collection Time: 06/11/17  2:23 PM  Result Value Ref Range   Yeast Wet Prep HPF POC NONE SEEN NONE SEEN   Trich, Wet Prep NONE SEEN NONE SEEN   Clue Cells Wet Prep HPF POC NONE SEEN NONE SEEN   WBC, Wet Prep HPF POC FEW (A) NONE SEEN   Sperm NONE SEEN    GC/CT sent  Assessment and Plan   1. Abdominal pain during pregnancy in first trimester    2. Pregnancy related nausea, antepartum    Discharged home with reassurance of viable IUP, probably physiologic abdominal pain General measures for nausea relief, OTC meds reviewed; Fill Phenergan rx if needed First trimester precautions  Allergies as of 06/11/2017   No Known Allergies     Medication List    TAKE these medications   prenatal multivitamin Tabs tablet Take 1 tablet by mouth daily at 12 noon.   promethazine 12.5 MG tablet Commonly known as:  PHENERGAN Take 1 tablet (12.5 mg total) by mouth every 6 (six) hours as needed for nausea or vomiting.      Follow-up Information    [facility] Follow up on 07/05/2019.   Contact information: [address] [place] [address] [phone]          Kariana Wiles CNM 06/11/2017, 2:26 PM

## 2017-06-11 NOTE — Discharge Instructions (Signed)
Abdominal Pain During Pregnancy °Abdominal pain is common in pregnancy. Most of the time, it does not cause harm. There are many causes of abdominal pain. Some causes are more serious than others and sometimes the cause is not known. Abdominal pain can be a sign that something is very wrong with the pregnancy or the pain may have nothing to do with the pregnancy. Always tell your health care provider if you have any abdominal pain. °Follow these instructions at home: °· Do not have sex or put anything in your vagina until your symptoms go away completely. °· Watch your abdominal pain for any changes. °· Get plenty of rest until your pain improves. °· Drink enough fluid to keep your urine clear or pale yellow. °· Take over-the-counter or prescription medicines only as told by your health care provider. °· Keep all follow-up visits as told by your health care provider. This is important. °Contact a health care provider if: °· You have a fever. °· Your pain gets worse or you have cramping. °· Your pain continues after resting. °Get help right away if: °· You are bleeding, leaking fluid, or passing tissue from the vagina. °· You have vomiting or diarrhea that does not go away. °· You have painful or bloody urination. °· You notice a decrease in your baby's movements. °· You feel very weak or faint. °· You have shortness of breath. °· You develop a severe headache with abdominal pain. °· You have abnormal vaginal discharge with abdominal pain. °This information is not intended to replace advice given to you by your health care provider. Make sure you discuss any questions you have with your health care provider. °Document Released: 10/11/2005 Document Revised: 07/22/2016 Document Reviewed: 05/10/2013 °Elsevier Interactive Patient Education © 2018 [employer] ° °Morning Sickness °Morning sickness is when you feel sick to your stomach (nauseous) during pregnancy. This nauseous feeling may or may not come with vomiting.  It often occurs in the morning but can be a problem any time of day. Morning sickness is most common during the first trimester, but it may continue throughout pregnancy. While morning sickness is unpleasant, it is usually harmless unless you develop severe and continual vomiting (hyperemesis gravidarum). This condition requires more intense treatment. °What are the causes? °The cause of morning sickness is not completely known but seems to be related to normal hormonal changes that occur in pregnancy. °What increases the risk? °You are at greater risk if you: °· Experienced nausea or vomiting before your pregnancy. °· Had morning sickness during a previous pregnancy. °· Are pregnant with more than one baby, such as twins. ° °How is this treated? °Do not use any medicines (prescription, over-the-counter, or herbal) for morning sickness without first talking to your health care provider. Your health care provider may prescribe or recommend: °· Vitamin B6 supplements. °· Anti-nausea medicines. °· The herbal medicine ginger. ° °Follow these instructions at home: °· Only take over-the-counter or prescription medicines as directed by your health care provider. °· Taking multivitamins before getting pregnant can prevent or decrease the severity of morning sickness in most women. °· Eat a piece of dry toast or unsalted crackers before getting out of bed in the morning. °· Eat five or six small meals a day. °· Eat dry and bland foods (rice, baked potato). Foods high in carbohydrates are often helpful. °· Do not drink liquids with your meals. Drink liquids between meals. °· Avoid greasy, fatty, and spicy foods. °· Get someone to cook for   you if the smell of any food causes nausea and vomiting. °· If you feel nauseous after taking prenatal vitamins, take the vitamins at night or with a snack. °· Snack on protein foods (nuts, yogurt, cheese) between meals if you are hungry. °· Eat unsweetened gelatins for desserts. °· Wearing  an acupressure wristband (worn for sea sickness) may be helpful. °· Acupuncture may be helpful. °· Do not smoke. °· Get a humidifier to keep the air in your house free of odors. °· Get plenty of fresh air. °Contact a health care provider if: °· Your home remedies are not working, and you need medicine. °· You feel dizzy or lightheaded. °· You are losing weight. °Get help right away if: °· You have persistent and uncontrolled nausea and vomiting. °· You pass out (faint). °This information is not intended to replace advice given to you by your health care provider. Make sure you discuss any questions you have with your health care provider. °Document Released: 12/02/2006 Document Revised: 03/18/2016 Document Reviewed: 03/28/2013 °Elsevier Interactive Patient Education © 2017 Elsevier Inc. ° °

## 2017-06-11 NOTE — MAU Note (Signed)
Patient has been having right lower abdominal cramping and sharp pains intermittently for past few weeks.  She has an appointment with [facility] on 07/04/17, but wanted to be seen before then.  States having some loose stools over past few days, 1 watery in past 24hrs.  No vomiting, but having nausea.  No c/o vaginal discharge or bleeding.

## 2017-06-22 ENCOUNTER — Telehealth: Payer: Self-pay

## 2017-06-22 NOTE — Telephone Encounter (Signed)
Patient called in wanting to know what meds were safe to take during pregnancy for sinus pressure, went over the safe OTC med list.

## 2017-07-04 ENCOUNTER — Encounter: Payer: Self-pay | Admitting: Obstetrics and Gynecology

## 2017-07-04 ENCOUNTER — Ambulatory Visit (INDEPENDENT_AMBULATORY_CARE_PROVIDER_SITE_OTHER): Payer: Medicaid Other | Admitting: Obstetrics and Gynecology

## 2017-07-04 ENCOUNTER — Other Ambulatory Visit (HOSPITAL_COMMUNITY)
Admission: RE | Admit: 2017-07-04 | Discharge: 2017-07-04 | Disposition: A | Discharge status: Home / Self Care | Payer: Medicaid Other | Source: Ambulatory Visit | Attending: Obstetrics and Gynecology | Admitting: Obstetrics and Gynecology

## 2017-07-04 VITALS — BP 111/69 | HR 76 | Temp 97.8°F | Wt 143.0 lb

## 2017-07-04 DIAGNOSIS — Z3401 Encounter for supervision of normal first pregnancy, first trimester: Secondary | ICD-10-CM | POA: Diagnosis not present

## 2017-07-04 DIAGNOSIS — Z3400 Encounter for supervision of normal first pregnancy, unspecified trimester: Secondary | ICD-10-CM | POA: Insufficient documentation

## 2017-07-04 MED ORDER — VITAFOL ULTRA 29-0.6-0.4-200 MG PO CAPS: 1.0000 | ORAL_CAPSULE | Freq: Every day | ORAL | 12 refills | Status: DC

## 2017-07-04 NOTE — Progress Notes (Signed)
  Subjective:    Andrea Jenkins is a G1P0000 5883w5d being seen today for her first obstetrical visit.  Her obstetrical history is significant for first teen pregnancy. Patient does intend to breast feed. Pregnancy history fully reviewed.  Patient reports no complaints.  Vitals:   07/04/17 1031  BP: 111/69  Pulse: 76  Temp: 97.8 F (36.6 C)  Weight: 143 lb (64.9 kg)    HISTORY: OB History  Gravida Para Term Preterm AB Living  1 0 0 0 0 0  SAB TAB Ectopic Multiple Live Births  0 0 0 0      # Outcome Date GA Lbr Len/2nd Weight Sex Delivery Anes PTL Lv  1 Current              Past Medical History:  Diagnosis Date  . Asthma   . Gonorrhea   . Heart murmur   . Vaginal Pap smear, abnormal    Past Surgical History:  Procedure Laterality Date  . NO PAST SURGERIES    . WISDOM TOOTH EXTRACTION     Family History  Problem Relation Age of Onset  . Hypertension Mother   . Miscarriages / [place] Mother   . Hypertension Maternal Grandmother   . Asthma Maternal Grandmother   . Diabetes Maternal Grandmother   . Vision loss Maternal Grandmother   . Mental illness Brother   . Cancer Maternal Aunt   . Heart disease Neg Hx      Exam    Uterus:   12-week size uterus  Pelvic Exam:    Perineum: No Hemorrhoids, Normal Perineum   Vulva: normal   Vagina:  normal mucosa, normal discharge   pH:    Cervix: nulliparous appearance   Adnexa: normal adnexa and no mass, fullness, tenderness   Bony Pelvis: gynecoid  System: Breast:  normal appearance, no masses or tenderness   Skin: normal coloration and turgor, no rashes    Neurologic: oriented, no focal deficits   Extremities: normal strength, tone, and muscle mass   HEENT extra ocular movement intact   Mouth/Teeth mucous membranes moist, pharynx normal without lesions and dental hygiene good   Neck supple and no masses   Cardiovascular: regular rate and rhythm   Respiratory:  chest clear, no wheezing, crepitations,  rhonchi, normal symmetric air entry   Abdomen: soft, non-tender; bowel sounds normal; no masses,  no organomegaly   Urinary:       Assessment:    Pregnancy: G1P0000 Patient Active Problem List   Diagnosis Date Noted  . 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 UTI symptoms 10/03/2014  . BV (bacterial vaginosis) 11/14/2013  . Vaginitis and vulvovaginitis, unspecified 02/07/2013        Plan:     Initial labs drawn. Prenatal vitamins. Problem list reviewed and updated. Genetic Screening discussed First Screen: ordered.  Ultrasound discussed; fetal survey: requested. Will be ordered at a subsequent visit  Follow up in 4 weeks. Baby Rx information provided. Patient will decide if she desires enrolment in optimized schedule at her next visit 50% of 30 min visit spent on counseling and coordination of care.     Andrea Jenkins 07/04/2017

## 2017-07-04 NOTE — Patient Instructions (Signed)
 First Trimester of Pregnancy The first trimester of pregnancy is from week 1 until the end of week 13 (months 1 through 3). A week after a sperm fertilizes an egg, the egg will implant on the wall of the uterus. This embryo will begin to develop into a baby. Genes from you and your partner will form the baby. The female genes will determine whether the baby will be a boy or a girl. At 6-8 weeks, the eyes and face will be formed, and the heartbeat can be seen on ultrasound. At the end of 12 weeks, all the baby's organs will be formed. Now that you are pregnant, you will want to do everything you can to have a healthy baby. Two of the most important things are to get good prenatal care and to follow your health care provider's instructions. Prenatal care is all the medical care you receive before the baby's birth. This care will help prevent, find, and treat any problems during the pregnancy and childbirth. Body changes during your first trimester Your body goes through many changes during pregnancy. The changes vary from woman to woman.  You may gain or lose a couple of pounds at first.  You may feel sick to your stomach (nauseous) and you may throw up (vomit). If the vomiting is uncontrollable, call your health care provider.  You may tire easily.  You may develop headaches that can be relieved by medicines. All medicines should be approved by your health care provider.  You may urinate more often. Painful urination may mean you have a bladder infection.  You may develop heartburn as a result of your pregnancy.  You may develop constipation because certain hormones are causing the muscles that push stool through your intestines to slow down.  You may develop hemorrhoids or swollen veins (varicose veins).  Your breasts may begin to grow larger and become tender. Your nipples may stick out more, and the tissue that surrounds them (areola) may become darker.  Your gums may bleed and may be  sensitive to brushing and flossing.  Dark spots or blotches (chloasma, mask of pregnancy) may develop on your face. This will likely fade after the baby is born.  Your menstrual periods will stop.  You may have a loss of appetite.  You may develop cravings for certain kinds of food.  You may have changes in your emotions from day to day, such as being excited to be pregnant or being concerned that something may go wrong with the pregnancy and baby.  You may have more vivid and strange dreams.  You may have changes in your hair. These can include thickening of your hair, rapid growth, and changes in texture. Some women also have hair loss during or after pregnancy, or hair that feels dry or thin. Your hair will most likely return to normal after your baby is born.  What to expect at prenatal visits During a routine prenatal visit:  You will be weighed to make sure you and the baby are growing normally.  Your blood pressure will be taken.  Your abdomen will be measured to track your baby's growth.  The fetal heartbeat will be listened to between weeks 10 and 14 of your pregnancy.  Test results from any previous visits will be discussed.  Your health care provider may ask you:  How you are feeling.  If you are feeling the baby move.  If you have had any abnormal symptoms, such as leaking fluid, bleeding, severe   headaches, or abdominal cramping.  If you are using any tobacco products, including cigarettes, chewing tobacco, and electronic cigarettes.  If you have any questions.  Other tests that may be performed during your first trimester include:  Blood tests to find your blood type and to check for the presence of any previous infections. The tests will also be used to check for low iron levels (anemia) and protein on red blood cells (Rh antibodies). Depending on your risk factors, or if you previously had diabetes during pregnancy, you may have tests to check for high blood  sugar that affects pregnant women (gestational diabetes).  Urine tests to check for infections, diabetes, or protein in the urine.  An ultrasound to confirm the proper growth and development of the baby.  Fetal screens for spinal cord problems (spina bifida) and Down syndrome.  HIV (human immunodeficiency virus) testing. Routine prenatal testing includes screening for HIV, unless you choose not to have this test.  You may need other tests to make sure you and the baby are doing well.  Follow these instructions at home: Medicines  Follow your health care provider's instructions regarding medicine use. Specific medicines may be either safe or unsafe to take during pregnancy.  Take a prenatal vitamin that contains at least 600 micrograms (mcg) of folic acid.  If you develop constipation, try taking a stool softener if your health care provider approves. Eating and drinking  Eat a balanced diet that includes fresh fruits and vegetables, whole grains, good sources of protein such as meat, eggs, or tofu, and low-fat dairy. Your health care provider will help you determine the amount of weight gain that is right for you.  Avoid raw meat and uncooked cheese. These carry germs that can cause birth defects in the baby.  Eating four or five small meals rather than three large meals a day may help relieve nausea and vomiting. If you start to feel nauseous, eating a few soda crackers can be helpful. Drinking liquids between meals, instead of during meals, also seems to help ease nausea and vomiting.  Limit foods that are high in fat and processed sugars, such as fried and sweet foods.  To prevent constipation: ? Eat foods that are high in fiber, such as fresh fruits and vegetables, whole grains, and beans. ? Drink enough fluid to keep your urine clear or pale yellow. Activity  Exercise only as directed by your health care provider. Most women can continue their usual exercise routine during  pregnancy. Try to exercise for 30 minutes at least 5 days a week. Exercising will help you: ? Control your weight. ? Stay in shape. ? Be prepared for labor and delivery.  Experiencing pain or cramping in the lower abdomen or lower back is a good sign that you should stop exercising. Check with your health care provider before continuing with normal exercises.  Try to avoid standing for long periods of time. Move your legs often if you must stand in one place for a long time.  Avoid heavy lifting.  Wear low-heeled shoes and practice good posture.  You may continue to have sex unless your health care provider tells you not to. Relieving pain and discomfort  Wear a good support bra to relieve breast tenderness.  Take warm sitz baths to soothe any pain or discomfort caused by hemorrhoids. Use hemorrhoid cream if your health care provider approves.  Rest with your legs elevated if you have leg cramps or low back pain.  If you   develop varicose veins in your legs, wear support hose. Elevate your feet for 15 minutes, 3-4 times a day. Limit salt in your diet. Prenatal care  Schedule your prenatal visits by the twelfth week of pregnancy. They are usually scheduled monthly at first, then more often in the last 2 months before delivery.  Write down your questions. Take them to your prenatal visits.  Keep all your prenatal visits as told by your health care provider. This is important. Safety  Wear your seat belt at all times when driving.  Make a list of emergency phone numbers, including numbers for family, friends, the hospital, and police and fire departments. General instructions  Ask your health care provider for a referral to a local prenatal education class. Begin classes no later than the beginning of month 6 of your pregnancy.  Ask for help if you have counseling or nutritional needs during pregnancy. Your health care provider can offer advice or refer you to specialists for help  with various needs.  Do not use hot tubs, steam rooms, or saunas.  Do not douche or use tampons or scented sanitary pads.  Do not cross your legs for long periods of time.  Avoid cat litter boxes and soil used by cats. These carry germs that can cause birth defects in the baby and possibly loss of the fetus by miscarriage or stillbirth.  Avoid all smoking, herbs, alcohol, and medicines not prescribed by your health care provider. Chemicals in these products affect the formation and growth of the baby.  Do not use any products that contain nicotine or tobacco, such as cigarettes and e-cigarettes. If you need help quitting, ask your health care provider. You may receive counseling support and other resources to help you quit.  Schedule a dentist appointment. At home, brush your teeth with a soft toothbrush and be gentle when you floss. Contact a health care provider if:  You have dizziness.  You have mild pelvic cramps, pelvic pressure, or nagging pain in the abdominal area.  You have persistent nausea, vomiting, or diarrhea.  You have a bad smelling vaginal discharge.  You have pain when you urinate.  You notice increased swelling in your face, hands, legs, or ankles.  You are exposed to fifth disease or chickenpox.  You are exposed to German measles (rubella) and have never had it. Get help right away if:  You have a fever.  You are leaking fluid from your vagina.  You have spotting or bleeding from your vagina.  You have severe abdominal cramping or pain.  You have rapid weight gain or loss.  You vomit blood or material that looks like coffee grounds.  You develop a severe headache.  You have shortness of breath.  You have any kind of trauma, such as from a fall or a car accident. Summary  The first trimester of pregnancy is from week 1 until the end of week 13 (months 1 through 3).  Your body goes through many changes during pregnancy. The changes vary from  woman to woman.  You will have routine prenatal visits. During those visits, your health care provider will examine you, discuss any test results you may have, and talk with you about how you are feeling. This information is not intended to replace advice given to you by your health care provider. Make sure you discuss any questions you have with your health care provider. Document Released: 10/05/2001 Document Revised: 09/22/2016 Document Reviewed: 09/22/2016 Elsevier Interactive Patient Education  2017   [employer]  Contraception Choices Contraception (birth control) is the use of any methods or devices to prevent pregnancy. Below are some methods to help avoid pregnancy. Hormonal methods  Contraceptive implant. This is a thin, plastic tube containing progesterone hormone. It does not contain estrogen hormone. Your health care provider inserts the tube in the inner part of the upper arm. The tube can remain in place for up to 3 years. After 3 years, the implant must be removed. The implant prevents the ovaries from releasing an egg (ovulation), thickens the cervical mucus to prevent sperm from entering the uterus, and thins the lining of the inside of the uterus.  Progesterone-only injections. These injections are given every 3 months by your health care provider to prevent pregnancy. This synthetic progesterone hormone stops the ovaries from releasing eggs. It also thickens cervical mucus and changes the uterine lining. This makes it harder for sperm to survive in the uterus.  Birth control pills. These pills contain estrogen and progesterone hormone. They work by preventing the ovaries from releasing eggs (ovulation). They also cause the cervical mucus to thicken, preventing the sperm from entering the uterus. Birth control pills are prescribed by a health care provider.Birth control pills can also be used to treat heavy periods.  Minipill. This type of birth control pill contains only the  progesterone hormone. They are taken every day of each month and must be prescribed by your health care provider.  Birth control patch. The patch contains hormones similar to those in birth control pills. It must be changed once a week and is prescribed by a health care provider.  Vaginal ring. The ring contains hormones similar to those in birth control pills. It is left in the vagina for 3 weeks, removed for 1 week, and then a new one is put back in place. The patient must be comfortable inserting and removing the ring from the vagina.A health care provider's prescription is necessary.  Emergency contraception. Emergency contraceptives prevent pregnancy after unprotected sexual intercourse. This pill can be taken right after sex or up to 5 days after unprotected sex. It is most effective the sooner you take the pills after having sexual intercourse. Most emergency contraceptive pills are available without a prescription. Check with your pharmacist. Do not use emergency contraception as your only form of birth control. Barrier methods  Female condom. This is a thin sheath (latex or rubber) that is worn over the penis during sexual intercourse. It can be used with spermicide to increase effectiveness.  Female condom. This is a soft, loose-fitting sheath that is put into the vagina before sexual intercourse.  Diaphragm. This is a soft, latex, dome-shaped barrier that must be fitted by a health care provider. It is inserted into the vagina, along with a spermicidal jelly. It is inserted before intercourse. The diaphragm should be left in the vagina for 6 to 8 hours after intercourse.  Cervical cap. This is a round, soft, latex or plastic cup that fits over the cervix and must be fitted by a health care provider. The cap can be left in place for up to 48 hours after intercourse.  Sponge. This is a soft, circular piece of polyurethane foam. The sponge has spermicide in it. It is inserted into the vagina  after wetting it and before sexual intercourse.  Spermicides. These are chemicals that kill or block sperm from entering the cervix and uterus. They come in the form of creams, jellies, suppositories, foam, or tablets. They do not   require a prescription. They are inserted into the vagina with an applicator before having sexual intercourse. The process must be repeated every time you have sexual intercourse. Intrauterine contraception  Intrauterine device (IUD). This is a T-shaped device that is put in a woman's uterus during a menstrual period to prevent pregnancy. There are 2 types: ? Copper IUD. This type of IUD is wrapped in copper wire and is placed inside the uterus. Copper makes the uterus and fallopian tubes produce a fluid that kills sperm. It can stay in place for 10 years. ? Hormone IUD. This type of IUD contains the hormone progestin (synthetic progesterone). The hormone thickens the cervical mucus and prevents sperm from entering the uterus, and it also thins the uterine lining to prevent implantation of a fertilized egg. The hormone can weaken or kill the sperm that get into the uterus. It can stay in place for 3-5 years, depending on which type of IUD is used. Permanent methods of contraception  Female tubal ligation. This is when the woman's fallopian tubes are surgically sealed, tied, or blocked to prevent the egg from traveling to the uterus.  Hysteroscopic sterilization. This involves placing a small coil or insert into each fallopian tube. Your doctor uses a technique called hysteroscopy to do the procedure. The device causes scar tissue to form. This results in permanent blockage of the fallopian tubes, so the sperm cannot fertilize the egg. It takes about 3 months after the procedure for the tubes to become blocked. You must use another form of birth control for these 3 months.  Female sterilization. This is when the female has the tubes that carry sperm tied off (vasectomy).This  blocks sperm from entering the vagina during sexual intercourse. After the procedure, the man can still ejaculate fluid (semen). Natural planning methods  Natural family planning. This is not having sexual intercourse or using a barrier method (condom, diaphragm, cervical cap) on days the woman could become pregnant.  Calendar method. This is keeping track of the length of each menstrual cycle and identifying when you are fertile.  Ovulation method. This is avoiding sexual intercourse during ovulation.  Symptothermal method. This is avoiding sexual intercourse during ovulation, using a thermometer and ovulation symptoms.  Post-ovulation method. This is timing sexual intercourse after you have ovulated. Regardless of which type or method of contraception you choose, it is important that you use condoms to protect against the transmission of sexually transmitted infections (STIs). Talk with your health care provider about which form of contraception is most appropriate for you. This information is not intended to replace advice given to you by your health care provider. Make sure you discuss any questions you have with your health care provider. Document Released: 10/11/2005 Document Revised: 03/18/2016 Document Reviewed: 04/05/2013 Elsevier Interactive Patient Education  2017 [employer]   Breastfeeding Deciding to breastfeed is one of the best choices you can make for you and your baby. A change in hormones during pregnancy causes your breast tissue to grow and increases the number and size of your milk ducts. These hormones also allow proteins, sugars, and fats from your blood supply to make breast milk in your milk-producing glands. Hormones prevent breast milk from being released before your baby is born as well as prompt milk flow after birth. Once breastfeeding has begun, thoughts of your baby, as well as his or her sucking or crying, can stimulate the release of milk from your  milk-producing glands. Benefits of breastfeeding For Your Baby  Your   first milk (colostrum) helps your baby's digestive system function better.  There are antibodies in your milk that help your baby fight off infections.  Your baby has a lower incidence of asthma, allergies, and sudden infant death syndrome.  The nutrients in breast milk are better for your baby than infant formulas and are designed uniquely for your baby's needs.  Breast milk improves your baby's brain development.  Your baby is less likely to develop other conditions, such as childhood obesity, asthma, or type 2 diabetes mellitus.  For You  Breastfeeding helps to create a very special bond between you and your baby.  Breastfeeding is convenient. Breast milk is always available at the correct temperature and costs nothing.  Breastfeeding helps to burn calories and helps you lose the weight gained during pregnancy.  Breastfeeding makes your uterus contract to its prepregnancy size faster and slows bleeding (lochia) after you give birth.  Breastfeeding helps to lower your risk of developing type 2 diabetes mellitus, osteoporosis, and breast or ovarian cancer later in life.  Signs that your baby is hungry Early Signs of Hunger  Increased alertness or activity.  Stretching.  Movement of the head from side to side.  Movement of the head and opening of the mouth when the corner of the mouth or cheek is stroked (rooting).  Increased sucking sounds, smacking lips, cooing, sighing, or squeaking.  Hand-to-mouth movements.  Increased sucking of fingers or hands.  Late Signs of Hunger  Fussing.  Intermittent crying.  Extreme Signs of Hunger Signs of extreme hunger will require calming and consoling before your baby will be able to breastfeed successfully. Do not wait for the following signs of extreme hunger to occur before you initiate breastfeeding:  Restlessness.  A loud, strong  cry.  Screaming.  Breastfeeding basics Breastfeeding Initiation  Find a comfortable place to sit or lie down, with your neck and back well supported.  Place a pillow or rolled up blanket under your baby to bring him or her to the level of your breast (if you are seated). Nursing pillows are specially designed to help support your arms and your baby while you breastfeed.  Make sure that your baby's abdomen is facing your abdomen.  Gently massage your breast. With your fingertips, massage from your chest wall toward your nipple in a circular motion. This encourages milk flow. You may need to continue this action during the feeding if your milk flows slowly.  Support your breast with 4 fingers underneath and your thumb above your nipple. Make sure your fingers are well away from your nipple and your baby's mouth.  Stroke your baby's lips gently with your finger or nipple.  When your baby's mouth is open wide enough, quickly bring your baby to your breast, placing your entire nipple and as much of the colored area around your nipple (areola) as possible into your baby's mouth. ? More areola should be visible above your baby's upper lip than below the lower lip. ? Your baby's tongue should be between his or her lower gum and your breast.  Ensure that your baby's mouth is correctly positioned around your nipple (latched). Your baby's lips should create a seal on your breast and be turned out (everted).  It is common for your baby to suck about 2-3 minutes in order to start the flow of breast milk.  Latching Teaching your baby how to latch on to your breast properly is very important. An improper latch can cause nipple pain and decreased   milk supply for you and poor weight gain in your baby. Also, if your baby is not latched onto your nipple properly, he or she may swallow some air during feeding. This can make your baby fussy. Burping your baby when you switch breasts during the feeding can  help to get rid of the air. However, teaching your baby to latch on properly is still the best way to prevent fussiness from swallowing air while breastfeeding. Signs that your baby has successfully latched on to your nipple:  Silent tugging or silent sucking, without causing you pain.  Swallowing heard between every 3-4 sucks.  Muscle movement above and in front of his or her ears while sucking.  Signs that your baby has not successfully latched on to nipple:  Sucking sounds or smacking sounds from your baby while breastfeeding.  Nipple pain.  If you think your baby has not latched on correctly, slip your finger into the corner of your baby's mouth to break the suction and place it between your baby's gums. Attempt breastfeeding initiation again. Signs of Successful Breastfeeding Signs from your baby:  A gradual decrease in the number of sucks or complete cessation of sucking.  Falling asleep.  Relaxation of his or her body.  Retention of a small amount of milk in his or her mouth.  Letting go of your breast by himself or herself.  Signs from you:  Breasts that have increased in firmness, weight, and size 1-3 hours after feeding.  Breasts that are softer immediately after breastfeeding.  Increased milk volume, as well as a change in milk consistency and color by the fifth day of breastfeeding.  Nipples that are not sore, cracked, or bleeding.  Signs That Your Baby is Getting Enough Milk  Wetting at least 1-2 diapers during the first 24 hours after birth.  Wetting at least 5-6 diapers every 24 hours for the first week after birth. The urine should be clear or pale yellow by 5 days after birth.  Wetting 6-8 diapers every 24 hours as your baby continues to grow and develop.  At least 3 stools in a 24-hour period by age 5 days. The stool should be soft and yellow.  At least 3 stools in a 24-hour period by age 7 days. The stool should be seedy and yellow.  No loss of  weight greater than 10% of birth weight during the first 3 days of age.  Average weight gain of 4-7 ounces (113-198 g) per week after age 4 days.  Consistent daily weight gain by age 5 days, without weight loss after the age of 2 weeks.  After a feeding, your baby may spit up a small amount. This is common. Breastfeeding frequency and duration Frequent feeding will help you make more milk and can prevent sore nipples and breast engorgement. Breastfeed when you feel the need to reduce the fullness of your breasts or when your baby shows signs of hunger. This is called "breastfeeding on demand." Avoid introducing a pacifier to your baby while you are working to establish breastfeeding (the first 4-6 weeks after your baby is born). After this time you may choose to use a pacifier. Research has shown that pacifier use during the first year of a baby's life decreases the risk of sudden infant death syndrome (SIDS). Allow your baby to feed on each breast as long as he or she wants. Breastfeed until your baby is finished feeding. When your baby unlatches or falls asleep while feeding from the first   breast, offer the second breast. Because newborns are often sleepy in the first few weeks of life, you may need to awaken your baby to get him or her to feed. Breastfeeding times will vary from baby to baby. However, the following rules can serve as a guide to help you ensure that your baby is properly fed:  Newborns (babies 4 weeks of age or younger) may breastfeed every 1-3 hours.  Newborns should not go longer than 3 hours during the day or 5 hours during the night without breastfeeding.  You should breastfeed your baby a minimum of 8 times in a 24-hour period until you begin to introduce solid foods to your baby at around 6 months of age.  Breast milk pumping Pumping and storing breast milk allows you to ensure that your baby is exclusively fed your breast milk, even at times when you are unable to  breastfeed. This is especially important if you are going back to work while you are still breastfeeding or when you are not able to be present during feedings. Your lactation consultant can give you guidelines on how long it is safe to store breast milk. A breast pump is a machine that allows you to pump milk from your breast into a sterile bottle. The pumped breast milk can then be stored in a refrigerator or freezer. Some breast pumps are operated by hand, while others use electricity. Ask your lactation consultant which type will work best for you. Breast pumps can be purchased, but some hospitals and breastfeeding support groups lease breast pumps on a monthly basis. A lactation consultant can teach you how to hand express breast milk, if you prefer not to use a pump. Caring for your breasts while you breastfeed Nipples can become dry, cracked, and sore while breastfeeding. The following recommendations can help keep your breasts moisturized and healthy:  Avoid using soap on your nipples.  Wear a supportive bra. Although not required, special nursing bras and tank tops are designed to allow access to your breasts for breastfeeding without taking off your entire bra or top. Avoid wearing underwire-style bras or extremely tight bras.  Air dry your nipples for 3-4minutes after each feeding.  Use only cotton bra pads to absorb leaked breast milk. Leaking of breast milk between feedings is normal.  Use lanolin on your nipples after breastfeeding. Lanolin helps to maintain your skin's normal moisture barrier. If you use pure lanolin, you do not need to wash it off before feeding your baby again. Pure lanolin is not toxic to your baby. You may also hand express a few drops of breast milk and gently massage that milk into your nipples and allow the milk to air dry.  In the first few weeks after giving birth, some women experience extremely full breasts (engorgement). Engorgement can make your breasts  feel heavy, warm, and tender to the touch. Engorgement peaks within 3-5 days after you give birth. The following recommendations can help ease engorgement:  Completely empty your breasts while breastfeeding or pumping. You may want to start by applying warm, moist heat (in the shower or with warm water-soaked hand towels) just before feeding or pumping. This increases circulation and helps the milk flow. If your baby does not completely empty your breasts while breastfeeding, pump any extra milk after he or she is finished.  Wear a snug bra (nursing or regular) or tank top for 1-2 days to signal your body to slightly decrease milk production.  Apply ice packs to   your breasts, unless this is too uncomfortable for you.  Make sure that your baby is latched on and positioned properly while breastfeeding.  If engorgement persists after 48 hours of following these recommendations, contact your health care provider or a lactation consultant. Overall health care recommendations while breastfeeding  Eat healthy foods. Alternate between meals and snacks, eating 3 of each per day. Because what you eat affects your breast milk, some of the foods may make your baby more irritable than usual. Avoid eating these foods if you are sure that they are negatively affecting your baby.  Drink milk, fruit juice, and water to satisfy your thirst (about 10 glasses a day).  Rest often, relax, and continue to take your prenatal vitamins to prevent fatigue, stress, and anemia.  Continue breast self-awareness checks.  Avoid chewing and smoking tobacco. Chemicals from cigarettes that pass into breast milk and exposure to secondhand smoke may harm your baby.  Avoid alcohol and drug use, including marijuana. Some medicines that may be harmful to your baby can pass through breast milk. It is important to ask your health care provider before taking any medicine, including all over-the-counter and prescription medicine as well  as vitamin and herbal supplements. It is possible to become pregnant while breastfeeding. If birth control is desired, ask your health care provider about options that will be safe for your baby. Contact a health care provider if:  You feel like you want to stop breastfeeding or have become frustrated with breastfeeding.  You have painful breasts or nipples.  Your nipples are cracked or bleeding.  Your breasts are red, tender, or warm.  You have a swollen area on either breast.  You have a fever or chills.  You have nausea or vomiting.  You have drainage other than breast milk from your nipples.  Your breasts do not become full before feedings by the fifth day after you give birth.  You feel sad and depressed.  Your baby is too sleepy to eat well.  Your baby is having trouble sleeping.  Your baby is wetting less than 3 diapers in a 24-hour period.  Your baby has less than 3 stools in a 24-hour period.  Your baby's skin or the white part of his or her eyes becomes yellow.  Your baby is not gaining weight by 5 days of age. Get help right away if:  Your baby is overly tired (lethargic) and does not want to wake up and feed.  Your baby develops an unexplained fever. This information is not intended to replace advice given to you by your health care provider. Make sure you discuss any questions you have with your health care provider. Document Released: 10/11/2005 Document Revised: 03/24/2016 Document Reviewed: 04/04/2013 Elsevier Interactive Patient Education  2017 [employer] Inc.  

## 2017-07-05 LAB — GC/CHLAMYDIA PROBE AMP (~~LOC~~) NOT AT ARMC
Chlamydia: NEGATIVE
NEISSERIA GONORRHEA: NEGATIVE

## 2017-07-06 LAB — URINE CULTURE, OB REFLEX

## 2017-07-06 LAB — CULTURE, OB URINE

## 2017-07-07 ENCOUNTER — Other Ambulatory Visit: Payer: Self-pay | Admitting: Obstetrics and Gynecology

## 2017-07-07 ENCOUNTER — Telehealth: Payer: Self-pay | Admitting: *Deleted

## 2017-07-07 MED ORDER — CEPHALEXIN 500 MG PO CAPS: 500.0000 mg | ORAL_CAPSULE | Freq: Four times a day (QID) | ORAL | 0 refills | Status: DC

## 2017-07-07 NOTE — Telephone Encounter (Signed)
Pt made aware of result and Rx info

## 2017-07-07 NOTE — Telephone Encounter (Signed)
-----   Message from Catalina AntiguaPeggy Constant, MD sent at 07/07/2017  1:38 PM EDT ----- Please inform patient of UTI. Rx has been e-prescribed  AnimatorThanks  Peggy

## 2017-07-13 ENCOUNTER — Encounter: Payer: Self-pay | Admitting: Obstetrics and Gynecology

## 2017-07-13 DIAGNOSIS — O09899 Supervision of other high risk pregnancies, unspecified trimester: Secondary | ICD-10-CM | POA: Insufficient documentation

## 2017-07-13 DIAGNOSIS — O9989 Other specified diseases and conditions complicating pregnancy, childbirth and the puerperium: Secondary | ICD-10-CM

## 2017-07-13 DIAGNOSIS — Z283 Underimmunization status: Secondary | ICD-10-CM | POA: Insufficient documentation

## 2017-07-13 LAB — OBSTETRIC PANEL, INCLUDING HIV
Antibody Screen: NEGATIVE
BASOS ABS: 0 10*3/uL (ref 0.0–0.2)
Basos: 0 %
EOS (ABSOLUTE): 0.1 10*3/uL (ref 0.0–0.4)
EOS: 2 %
HEMOGLOBIN: 12 g/dL (ref 11.1–15.9)
HEP B S AG: NEGATIVE
HIV Screen 4th Generation wRfx: NONREACTIVE
Hematocrit: 36.8 % (ref 34.0–46.6)
IMMATURE GRANS (ABS): 0 10*3/uL (ref 0.0–0.1)
Immature Granulocytes: 0 %
LYMPHS: 41 %
Lymphocytes Absolute: 2 10*3/uL (ref 0.7–3.1)
MCH: 27.8 pg (ref 26.6–33.0)
MCHC: 32.6 g/dL (ref 31.5–35.7)
MCV: 85 fL (ref 79–97)
MONOS ABS: 0.3 10*3/uL (ref 0.1–0.9)
Monocytes: 7 %
NEUTROS PCT: 50 %
Neutrophils Absolute: 2.4 10*3/uL (ref 1.4–7.0)
PLATELETS: 208 10*3/uL (ref 150–379)
RBC: 4.31 x10E6/uL (ref 3.77–5.28)
RDW: 13.7 % (ref 12.3–15.4)
RH TYPE: POSITIVE
RPR: NONREACTIVE
Rubella Antibodies, IGG: <0.9 index — ABNORMAL LOW (ref >0.99)
WBC: 4.9 10*3/uL (ref 3.4–10.8)

## 2017-07-13 LAB — SMN1 COPY NUMBER ANALYSIS (SMA CARRIER SCREENING)

## 2017-07-13 LAB — HEMOGLOBINOPATHY EVALUATION
HGB C: 0 %
HGB S: 0 %
HGB VARIANT: 0 %
Hemoglobin A2 Quantitation: 2.4 % (ref 1.8–3.2)
Hemoglobin F Quantitation: 0 % (ref 0.0–2.0)
Hgb A: 97.6 % (ref 96.4–98.8)

## 2017-07-13 LAB — VARICELLA ZOSTER ANTIBODY, IGG: VARICELLA: 167 {index} (ref >165)

## 2017-07-13 LAB — CYSTIC FIBROSIS MUTATION 97: Interpretation: NOT DETECTED

## 2017-07-16 ENCOUNTER — Inpatient Hospital Stay (HOSPITAL_COMMUNITY)
Admission: AD | Admit: 2017-07-16 | Discharge: 2017-07-16 | Disposition: A | Discharge status: Home / Self Care | Payer: Medicaid Other | Source: Ambulatory Visit | Attending: Obstetrics & Gynecology | Admitting: Obstetrics & Gynecology

## 2017-07-16 ENCOUNTER — Encounter (HOSPITAL_COMMUNITY): Payer: Self-pay

## 2017-07-16 DIAGNOSIS — R109 Unspecified abdominal pain: Secondary | ICD-10-CM

## 2017-07-16 DIAGNOSIS — Z3A15 15 weeks gestation of pregnancy: Secondary | ICD-10-CM | POA: Diagnosis not present

## 2017-07-16 DIAGNOSIS — Z87891 Personal history of nicotine dependence: Secondary | ICD-10-CM | POA: Diagnosis not present

## 2017-07-16 DIAGNOSIS — O26892 Other specified pregnancy related conditions, second trimester: Secondary | ICD-10-CM

## 2017-07-16 LAB — URINALYSIS, ROUTINE W REFLEX MICROSCOPIC
BILIRUBIN URINE: NEGATIVE
Glucose, UA: NEGATIVE mg/dL
HGB URINE DIPSTICK: NEGATIVE
Ketones, ur: NEGATIVE mg/dL
NITRITE: NEGATIVE
PROTEIN: NEGATIVE mg/dL
SPECIFIC GRAVITY, URINE: 1.011 (ref 1.005–1.030)
pH: 8 (ref 5.0–8.0)

## 2017-07-16 MED ORDER — IBUPROFEN 800 MG PO TABS
400.0000 mg | ORAL_TABLET | Freq: Once | ORAL | Status: AC
  Administered 2017-07-16: 400 mg via ORAL
  Filled 2017-07-16: qty 1

## 2017-07-16 NOTE — MAU Provider Note (Signed)
History     CSN: [id number]  Arrival date and time: 07/16/17 1256   First Provider Initiated Contact with Patient 07/16/17 1315      Chief Complaint  Patient presents with  . Abdominal Pain   HPI   [name] is a 21 y.o. female G1P0000 @ [redacted]w[redacted]d here with abdomial pain. States she pain started 3 days ago. It is a constant abdominal cramp.  The pain is in her lower abdomen. The pain improves when she lays on her side. No bleeding.   OB History    Gravida Para Term Preterm AB Living   1 0 0 0 0 0   SAB TAB Ectopic Multiple Live Births   0 0 0 0        Past Medical History:  Diagnosis Date  . Asthma   . Gonorrhea   . Heart murmur    when younger  . Vaginal Pap smear, abnormal     Past Surgical History:  Procedure Laterality Date  . WISDOM TOOTH EXTRACTION    . WISDOM TOOTH EXTRACTION      Family History  Problem Relation Age of Onset  . Hypertension Mother   . Miscarriages / [place] Mother   . Hypertension Maternal Grandmother   . Asthma Maternal Grandmother   . Diabetes Maternal Grandmother   . Vision loss Maternal Grandmother   . Mental illness Brother   . Cancer Maternal Aunt   . Heart disease Neg Hx     Social History  Substance Use Topics  . Smoking status: Former Smoker    Packs/day: 0.25    Types: Cigars    Quit date: 03/11/2017  . Smokeless tobacco: Never Used  . Alcohol use No    Allergies: No Known Allergies  Prescriptions Prior to Admission  Medication Sig Dispense Refill Last Dose  . cephALEXin (KEFLEX) 500 MG capsule Take 1 capsule (500 mg total) by mouth 4 (four) times daily. 28 capsule 0   . Prenat-Fe Poly-Methfol-FA-DHA (VITAFOL ULTRA) 29-0.6-0.4-200 MG CAPS Take 1 tablet by mouth daily. 30 capsule 12   . Prenatal Vit-Fe Fumarate-FA (PRENATAL MULTIVITAMIN) TABS tablet Take 1 tablet by mouth daily at 12 noon.   06/11/2017 at Unknown time  . promethazine (PHENERGAN) 12.5 MG tablet Take 1 tablet (12.5 mg total) by mouth every  6 (six) hours as needed for nausea or vomiting. 30 tablet 0    Results for orders placed or performed during the hospital encounter of 07/16/17 (from the past 48 hour(s))  Urinalysis, Routine w reflex microscopic     Status: Abnormal   Collection Time: 07/16/17  1:01 PM  Result Value Ref Range   Color, Urine YELLOW YELLOW   APPearance CLOUDY (A) CLEAR   Specific Gravity, Urine 1.011 1.005 - 1.030   pH 8.0 5.0 - 8.0   Glucose, UA NEGATIVE NEGATIVE mg/dL   Hgb urine dipstick NEGATIVE NEGATIVE   Bilirubin Urine NEGATIVE NEGATIVE   Ketones, ur NEGATIVE NEGATIVE mg/dL   Protein, ur NEGATIVE NEGATIVE mg/dL   Nitrite NEGATIVE NEGATIVE   Leukocytes, UA SMALL (A) NEGATIVE   RBC / HPF 0-5 0 - 5 RBC/hpf   WBC, UA 0-5 0 - 5 WBC/hpf   Bacteria, UA RARE (A) NONE SEEN   Squamous Epithelial / LPF 0-5 (A) NONE SEEN   Mucus PRESENT     Review of Systems  Constitutional: Negative for fever.  Gastrointestinal: Positive for abdominal pain.  Genitourinary: Negative for vaginal bleeding, vaginal discharge and vaginal pain.  Physical Exam   Blood pressure 119/62, pulse 89, temperature 98.4 F (36.9 C), resp. rate 16, height  (1.626 m), weight 147 lb (66.7 kg), last menstrual period 03/27/2017.  Physical Exam  Constitutional: She is oriented to person, place, and time. She appears well-developed and well-nourished. No distress.  HENT:  Head: Normocephalic.  Eyes: Pupils are equal, round, and reactive to light.  Respiratory: Effort normal.  GI: Soft. She exhibits no distension. There is no tenderness. There is no rebound.  Genitourinary:  Genitourinary Comments: Dilation: Closed Effacement (%): Thick Cervical Position: Posterior Exam by:: J Armeda Plumb NP  Musculoskeletal: Normal range of motion.  Neurological: She is alert and oriented to person, place, and time.  Skin: Skin is warm. She is not diaphoretic.  Psychiatric: Her behavior is normal.   MAU Course  Procedures  None  MDM  +  fetal heart tones via doppler  UA Ibuprofen 400 mg PO, pain down from 4/10-1/10  Assessment and Plan   A:  1. Abdominal pain in pregnancy, second trimester     P:  Discharge home in stable condition Cervix long and closed Urine culture pending pending Return to MAU if symptoms worsen  Increase PO fluid intake  Shameek Nyquist, Harolyn Rutherford, NP 07/16/2017 6:32 PM

## 2017-07-16 NOTE — Discharge Instructions (Signed)

## 2017-07-16 NOTE — MAU Note (Addendum)
Pt is having lower abdominal cramping 5/10,  Started 3 days ago, no bleeding or LOF

## 2017-07-17 LAB — CULTURE, OB URINE
CULTURE: NO GROWTH
Special Requests: NORMAL

## 2017-07-18 ENCOUNTER — Encounter (HOSPITAL_COMMUNITY): Payer: Self-pay

## 2017-07-18 ENCOUNTER — Ambulatory Visit (HOSPITAL_COMMUNITY)
Admission: RE | Admit: 2017-07-18 | Discharge: 2017-07-18 | Disposition: A | Discharge status: Home / Self Care | Payer: Medicaid Other | Source: Ambulatory Visit | Attending: Obstetrics and Gynecology | Admitting: Obstetrics and Gynecology

## 2017-07-18 ENCOUNTER — Other Ambulatory Visit: Payer: Self-pay | Admitting: Obstetrics and Gynecology

## 2017-07-18 DIAGNOSIS — Z3682 Encounter for antenatal screening for nuchal translucency: Secondary | ICD-10-CM

## 2017-07-18 DIAGNOSIS — Z3A13 13 weeks gestation of pregnancy: Secondary | ICD-10-CM | POA: Diagnosis not present

## 2017-07-18 DIAGNOSIS — Z3401 Encounter for supervision of normal first pregnancy, first trimester: Secondary | ICD-10-CM

## 2017-07-27 ENCOUNTER — Encounter: Payer: Self-pay | Admitting: *Deleted

## 2017-08-01 ENCOUNTER — Ambulatory Visit (INDEPENDENT_AMBULATORY_CARE_PROVIDER_SITE_OTHER): Payer: Medicaid Other | Admitting: Obstetrics and Gynecology

## 2017-08-01 VITALS — BP 116/71 | HR 80 | Wt 145.9 lb

## 2017-08-01 DIAGNOSIS — Z2839 Other underimmunization status: Secondary | ICD-10-CM

## 2017-08-01 DIAGNOSIS — Z3400 Encounter for supervision of normal first pregnancy, unspecified trimester: Secondary | ICD-10-CM

## 2017-08-01 DIAGNOSIS — Z283 Underimmunization status: Secondary | ICD-10-CM

## 2017-08-01 DIAGNOSIS — O9989 Other specified diseases and conditions complicating pregnancy, childbirth and the puerperium: Secondary | ICD-10-CM

## 2017-08-01 DIAGNOSIS — Z3402 Encounter for supervision of normal first pregnancy, second trimester: Secondary | ICD-10-CM

## 2017-08-01 NOTE — Progress Notes (Signed)
   PRENATAL VISIT NOTE  Subjective:  Andrea Jenkins is a 21 y.o. G1P0000 at [redacted]w[redacted]d being seen today for ongoing prenatal care.  She is currently monitored for the following issues for this low-risk pregnancy and has Vaginitis and vulvovaginitis, unspecified; BV (bacterial vaginosis); UTI symptoms; LGSIL on Pap smear of cervix; Supervision of normal first teen pregnancy, unspecified trimester; and Rubella non-immune status, antepartum on her problem list.  Patient reports no complaints.  Contractions: Not present. Vag. Bleeding: None.  Movement: Present. Denies leaking of fluid.   The following portions of the patient's history were reviewed and updated as appropriate: allergies, current medications, past family history, past medical history, past social history, past surgical history and problem list. Problem list updated.  Objective:   Vitals:   08/01/17 1001  BP: 116/71  Pulse: 80  Weight: 145 lb 14.4 oz (66.2 kg)    Fetal Status: Fetal Heart Rate (bpm): 145   Movement: Present     General:  Alert, oriented and cooperative. Patient is in no acute distress.  Skin: Skin is warm and dry. No rash noted.   Cardiovascular: Normal heart rate noted  Respiratory: Normal respiratory effort, no problems with respiration noted  Abdomen: Soft, gravid, appropriate for gestational age.  Pain/Pressure: Absent     Pelvic: Cervical exam deferred        Extremities: Normal range of motion.  Edema: None  Mental Status:  Normal mood and affect. Normal behavior. Normal judgment and thought content.   Assessment and Plan:  Pregnancy: G1P0000 at [redacted]w[redacted]d  1. Supervision of normal first teen pregnancy, unspecified trimester Patient is doing well without complaints AFP today Anatomy ultrasound ordered Patient undecided on contraception Patient declined flu vaccine - AFP, Serum, Open Spina Bifida - [place] MFM OB COMP + 14 WK; Future  2. Rubella non-immune status, antepartum Will offer pp  Preterm  labor symptoms and general obstetric precautions including but not limited to vaginal bleeding, contractions, leaking of fluid and fetal movement were reviewed in detail with the patient. Please refer to After Visit Summary for other counseling recommendations.  Return in about 4 weeks (around 08/29/2017) for ROB.   Catalina Antigua, MD

## 2017-08-01 NOTE — Progress Notes (Signed)
Patient states that she has started feeling fetal flutter movements, denies pain.

## 2017-08-03 LAB — AFP, SERUM, OPEN SPINA BIFIDA
AFP MoM: 1.05
AFP Value: 35.1 ng/mL
Gest. Age on Collection Date: 15.4 weeks
MATERNAL AGE AT EDD: 20.3 a
OSBR RISK 1 IN: 10000
TEST RESULTS AFP: NEGATIVE
Weight: 146 [lb_av]

## 2017-08-15 ENCOUNTER — Inpatient Hospital Stay (EMERGENCY_DEPARTMENT_HOSPITAL)
Admission: AD | Admit: 2017-08-15 | Discharge: 2017-08-16 | Disposition: A | Discharge status: Home / Self Care | Payer: Medicaid Other | Source: Ambulatory Visit | Attending: Obstetrics & Gynecology | Admitting: Obstetrics & Gynecology

## 2017-08-15 ENCOUNTER — Inpatient Hospital Stay (HOSPITAL_COMMUNITY)
Admission: AD | Admit: 2017-08-15 | Discharge: 2017-08-15 | Discharge status: Against Medical Advice | Payer: Medicaid Other | Source: Ambulatory Visit | Attending: Obstetrics and Gynecology | Admitting: Obstetrics and Gynecology

## 2017-08-15 ENCOUNTER — Encounter (HOSPITAL_COMMUNITY): Payer: Self-pay | Admitting: Obstetrics and Gynecology

## 2017-08-15 ENCOUNTER — Encounter (HOSPITAL_COMMUNITY): Payer: Self-pay | Admitting: *Deleted

## 2017-08-15 DIAGNOSIS — Z87891 Personal history of nicotine dependence: Secondary | ICD-10-CM | POA: Insufficient documentation

## 2017-08-15 DIAGNOSIS — J329 Chronic sinusitis, unspecified: Secondary | ICD-10-CM | POA: Insufficient documentation

## 2017-08-15 DIAGNOSIS — J310 Chronic rhinitis: Secondary | ICD-10-CM

## 2017-08-15 DIAGNOSIS — O99512 Diseases of the respiratory system complicating pregnancy, second trimester: Secondary | ICD-10-CM | POA: Insufficient documentation

## 2017-08-15 DIAGNOSIS — O26892 Other specified pregnancy related conditions, second trimester: Secondary | ICD-10-CM | POA: Diagnosis present

## 2017-08-15 DIAGNOSIS — Z3A17 17 weeks gestation of pregnancy: Secondary | ICD-10-CM | POA: Diagnosis not present

## 2017-08-15 LAB — URINALYSIS, ROUTINE W REFLEX MICROSCOPIC
Bilirubin Urine: NEGATIVE
Glucose, UA: NEGATIVE mg/dL
Hgb urine dipstick: NEGATIVE
KETONES UR: NEGATIVE mg/dL
LEUKOCYTES UA: NEGATIVE
NITRITE: NEGATIVE
PROTEIN: NEGATIVE mg/dL
Specific Gravity, Urine: 1.012 (ref 1.005–1.030)
pH: 7 (ref 5.0–8.0)

## 2017-08-15 NOTE — MAU Provider Note (Signed)
History     CSN: [id number]  Arrival date and time: 08/15/17 2125   Chief Complaint  Patient presents with  . Facial Pain    3wks  . Headache    3wks    HPI: Andrea Jenkins is a 21 y.o. G1P0000 with IUP at [redacted]w[redacted]d who presents to maternity admissions reporting 3-week hx of congestion, rhinorrhea, and nonproductive cough. She reports worsening congestion over the last few days, with increase in rhinorrhea, and now also having facial pressure and headache. Has only tried Tylenol and OTC nasal spray. Report history of seasonal allergies, but not taking anything for this, as she says symptoms are not typically this bad. Denies fever, chills, nausea, vomiting, diarrhea, abdominal pain, SOB or chest pain.   Past obstetric history: OB History  Gravida Para Term Preterm AB Living  1 0 0 0 0 0  SAB TAB Ectopic Multiple Live Births  0 0 0 0      # Outcome Date GA Lbr Len/2nd Weight Sex Delivery Anes PTL Lv  1 Current               Past Medical History:  Diagnosis Date  . Asthma   . Gonorrhea   . Heart murmur    when younger  . Vaginal Pap smear, abnormal     Past Surgical History:  Procedure Laterality Date  . WISDOM TOOTH EXTRACTION    . WISDOM TOOTH EXTRACTION    . WRIST SURGERY      Family History  Problem Relation Age of Onset  . Hypertension Mother   . Miscarriages / [place] Mother   . Hypertension Maternal Grandmother   . Asthma Maternal Grandmother   . Diabetes Maternal Grandmother   . Vision loss Maternal Grandmother   . Mental illness Brother   . Cancer Maternal Aunt   . Heart disease Neg Hx     Social History  Substance Use Topics  . Smoking status: Former Smoker    Packs/day: 0.25    Types: Cigars    Quit date: 03/11/2017  . Smokeless tobacco: Never Used  . Alcohol use No    Allergies:  Allergies  Allergen Reactions  . Peach [Prunus Persica] Hives    Prescriptions Prior to Admission  Medication Sig Dispense Refill Last Dose  .  cephALEXin (KEFLEX) 500 MG capsule Take 1 capsule (500 mg total) by mouth 4 (four) times daily. (Patient not taking: Reported on 08/01/2017) 28 capsule 0 Not Taking  . Prenat-Fe Poly-Methfol-FA-DHA (VITAFOL ULTRA) 29-0.6-0.4-200 MG CAPS Take 1 tablet by mouth daily. 30 capsule 12 Taking  . promethazine (PHENERGAN) 12.5 MG tablet Take 1 tablet (12.5 mg total) by mouth every 6 (six) hours as needed for nausea or vomiting. 30 tablet 0 Taking    Review of Systems - Negative except for what is mentioned in HPI.  Physical Exam   Blood pressure 116/63, pulse 78, temperature 98.4 F (36.9 C), temperature source Oral, resp. rate 17, height 5\' 4"  (1.626 m), weight 151 lb (68.5 kg), last menstrual period 03/27/2017, SpO2 100 %.  Constitutional: Well-developed, well-nourished female in no acute distress.  HENT: [place]/AT. Erythematous nasal mucosa with prominent nasal turbinates. Mildly erythematous posterior pharynx w/o lesions or exudates. MMM Neck: +L cervical mobile LAD Eyes: normal conjunctivae, no scleral icterus Cardiovascular: normal rate, regular rhythm Respiratory: normal effort, lungs CTAB.  GI: Abd soft, non-tender, gravid appropriate for gestational age.   MSK: Extremities nontender, no edema, normal ROM Neurologic: Alert and oriented x 4. Psych:  Normal mood and affect Skin: warm and dry   FHT:  154  MAU Course  Procedures Patient seen, evaluated, VS and nursing notes reviewed.   MDM Patient with 3-week symptoms of rhinosinusitis, likely allergic. However, given recent worsening of symptoms, facial pain, and increase in drainage, and cervical LAD, will treat for possible superimposed bacterial sinusitis.   Assessment and Plan  Assessment: 1. Rhinosinusitis     Plan: --Rx: Augmentin x 7 days --Start Zyrtec daily as well given hx of seasonal allergies --Discussed symptomatic treatment with sinus rinses with warm water and salt --Discussed return precautions.  Rynell Ciotti, Kandra NicolasJulie P,  MD 08/15/2017 11:39 PM

## 2017-08-15 NOTE — MAU Note (Signed)
#  3 not in lobby 

## 2017-08-15 NOTE — MAU Note (Signed)
#  2 not in lobby 

## 2017-08-15 NOTE — MAU Note (Signed)
At first thought it was just a cold due to weather changes.  But has continued.  Hurting up in here(points to cheeks/below eyes), body is aching.  Sore throat,  Nasal drainage, body aches, has no cough. Has not checked temp.  Going on for 3 wks.

## 2017-08-15 NOTE — Discharge Instructions (Signed)
SAFE MEDICATIONS IN PREGNANCY  Acne:  Benzoyl Peroxide  Salicylic Acid   Backache/Headache:  Tylenol: 2 regular strength every 4 hours OR               2 Extra strength every 6 hours   Colds/Coughs/Allergies:  Benadryl (alcohol free) 25 mg every 6 hours as needed  Breath right strips  Claritin  Cepacol throat lozenges  Chloraseptic throat spray  Cold-Eeze- up to three times per day  Cough drops, alcohol free  Flonase (by prescription only)  Guaifenesin  Mucinex  Robitussin DM (plain only, alcohol free)  Saline nasal spray/drops  Sudafed (pseudoephedrine) & Actifed * use only after [redacted] weeks gestation and if you do not have high blood pressure  Tylenol  Vicks Vaporub  Zinc lozenges  Zyrtec   Constipation:  Colace  Ducolax suppositories  Fleet enema  Glycerin suppositories  Metamucil  Milk of magnesia  Miralax  Senokot  Smooth move tea   Diarrhea:  Kaopectate  Imodium A-D   *NO pepto Bismol   Hemorrhoids:  Anusol  Anusol HC  Preparation H  Tucks   Indigestion:  Tums  Maalox  Mylanta  Zantac  Pepcid   Insomnia:  Benadryl (alcohol free) 25mg every 6 hours as needed  Tylenol PM  Unisom, no Gelcaps   Leg Cramps:  Tums  MagGel   Nausea/Vomiting:  Bonine  Dramamine  Emetrol  Ginger extract  Sea bands  Meclizine  Nausea medication to take during pregnancy:  Unisom (doxylamine succinate 25 mg tablets) Take one tablet daily at bedtime. If symptoms are not adequately controlled, the dose can be increased to a maximum recommended dose of two tablets daily (1/2 tablet in the morning, 1/2 tablet mid-afternoon and one at bedtime).  Vitamin B6 100mg tablets. Take one tablet twice a day (up to 200 mg per day).   Skin Rashes:  Aveeno products  Benadryl cream or 25mg every 6 hours as needed  Calamine Lotion  1% cortisone cream   Yeast infection:  Gyne-lotrimin 7  Monistat 7    **If taking multiple medications, please check labels to avoid  duplicating the same active ingredients  **take medication as directed on the label  ** Do not exceed 4000 mg of tylenol in 24 hours  **Do not take medications that contain aspirin or ibuprofen   

## 2017-08-15 NOTE — MAU Note (Signed)
Pt states she was here earlier but left because she had to pick up sister. States she has been sick for "awhile" now. States she thought it was allergies, but has not improved. Pt reports a lot of congestion. Has a nonproductive cough. Pt also reports HA. Denies fever. States she has used nasal spray and tylenol. Pt has not taken allergy or cold medicine.

## 2017-08-15 NOTE — MAU Note (Signed)
#  1 not in lobby  

## 2017-08-16 DIAGNOSIS — J329 Chronic sinusitis, unspecified: Secondary | ICD-10-CM

## 2017-08-16 MED ORDER — AMOXICILLIN-POT CLAVULANATE ER 1000-62.5 MG PO TB12: 2.0000 | ORAL_TABLET | Freq: Two times a day (BID) | ORAL | 0 refills | Status: AC

## 2017-08-16 MED ORDER — CETIRIZINE HCL 10 MG PO TABS: 10.0000 mg | ORAL_TABLET | Freq: Every day | ORAL | 1 refills | Status: DC

## 2017-08-19 ENCOUNTER — Other Ambulatory Visit: Payer: Self-pay | Admitting: Obstetrics and Gynecology

## 2017-08-19 ENCOUNTER — Ambulatory Visit (HOSPITAL_COMMUNITY)
Admission: RE | Admit: 2017-08-19 | Discharge: 2017-08-19 | Disposition: A | Discharge status: Home / Self Care | Payer: Medicaid Other | Source: Ambulatory Visit | Attending: Obstetrics and Gynecology | Admitting: Obstetrics and Gynecology

## 2017-08-19 DIAGNOSIS — Z363 Encounter for antenatal screening for malformations: Secondary | ICD-10-CM

## 2017-08-19 DIAGNOSIS — Z3400 Encounter for supervision of normal first pregnancy, unspecified trimester: Secondary | ICD-10-CM

## 2017-08-19 DIAGNOSIS — Z3A19 19 weeks gestation of pregnancy: Secondary | ICD-10-CM

## 2017-08-19 DIAGNOSIS — Z3A18 18 weeks gestation of pregnancy: Secondary | ICD-10-CM | POA: Diagnosis not present

## 2017-08-22 ENCOUNTER — Ambulatory Visit (HOSPITAL_COMMUNITY): Payer: Medicaid Other

## 2017-08-29 ENCOUNTER — Encounter: Payer: Self-pay | Admitting: Obstetrics and Gynecology

## 2017-08-29 ENCOUNTER — Ambulatory Visit (INDEPENDENT_AMBULATORY_CARE_PROVIDER_SITE_OTHER): Payer: Medicaid Other | Admitting: Obstetrics and Gynecology

## 2017-08-29 VITALS — BP 98/62 | HR 77 | Wt 151.0 lb

## 2017-08-29 DIAGNOSIS — O9989 Other specified diseases and conditions complicating pregnancy, childbirth and the puerperium: Secondary | ICD-10-CM

## 2017-08-29 DIAGNOSIS — O09899 Supervision of other high risk pregnancies, unspecified trimester: Secondary | ICD-10-CM

## 2017-08-29 DIAGNOSIS — Z283 Underimmunization status: Secondary | ICD-10-CM

## 2017-08-29 DIAGNOSIS — Z3400 Encounter for supervision of normal first pregnancy, unspecified trimester: Secondary | ICD-10-CM

## 2017-08-29 NOTE — Progress Notes (Signed)
   PRENATAL VISIT NOTE  Subjective:  Andrea Jenkins is a 21 y.o. G1P0000 at 7121w4d being seen today for ongoing prenatal care.  She is currently monitored for the following issues for this low-risk pregnancy and has Vaginitis and vulvovaginitis, unspecified; BV (bacterial vaginosis); UTI symptoms; LGSIL on Pap smear of cervix; Supervision of normal first teen pregnancy, unspecified trimester; and Rubella non-immune status, antepartum on their problem list.  Patient reports no complaints.  Contractions: Not present. Vag. Bleeding: None.  Movement: Present. Denies leaking of fluid.   The following portions of the patient's history were reviewed and updated as appropriate: allergies, current medications, past family history, past medical history, past social history, past surgical history and problem list. Problem list updated.  Objective:   Vitals:   08/29/17 1051  BP: 98/62  Pulse: 77  Weight: 151 lb (68.5 kg)    Fetal Status: Fetal Heart Rate (bpm): 148    Movement: Present     General:  Alert, oriented and cooperative. Patient is in no acute distress.  Skin: Skin is warm and dry. No rash noted.   Cardiovascular: Normal heart rate noted  Respiratory: Normal respiratory effort, no problems with respiration noted  Abdomen: Soft, gravid, appropriate for gestational age.  Pain/Pressure: Present     Pelvic: Cervical exam deferred        Extremities: Normal range of motion.  Edema: None  Mental Status:  Normal mood and affect. Normal behavior. Normal judgment and thought content.   Assessment and Plan:  Pregnancy: G1P0000 at 3421w4d  1. Supervision of normal first teen pregnancy, unspecified trimester Patient is doing well without complaints Follow up anatomy ultrasound ordered  2. Rubella non-immune status, antepartum Will offer pp  Preterm labor symptoms and general obstetric precautions including but not limited to vaginal bleeding, contractions, leaking of fluid and fetal  movement were reviewed in detail with the patient. Please refer to After Visit Summary for other counseling recommendations.  Return in about 4 weeks (around 09/26/2017) for ROB.   Catalina AntiguaPeggy Harbor Vanover, MD

## 2017-09-12 ENCOUNTER — Telehealth: Payer: Self-pay

## 2017-09-12 NOTE — Telephone Encounter (Signed)
Pt mom called stating that pt has not had a bowel movement in about 2 weeks. Provided otc treatments per protocol. Please advise if there are other recommendations

## 2017-09-16 ENCOUNTER — Encounter (HOSPITAL_COMMUNITY): Payer: Self-pay

## 2017-09-16 ENCOUNTER — Inpatient Hospital Stay (HOSPITAL_COMMUNITY)
Admission: AD | Admit: 2017-09-16 | Discharge: 2017-09-16 | Disposition: A | Discharge status: Home / Self Care | Payer: Medicaid Other | Source: Ambulatory Visit | Attending: Obstetrics and Gynecology | Admitting: Obstetrics and Gynecology

## 2017-09-16 ENCOUNTER — Other Ambulatory Visit: Payer: Self-pay

## 2017-09-16 DIAGNOSIS — O99612 Diseases of the digestive system complicating pregnancy, second trimester: Secondary | ICD-10-CM | POA: Diagnosis not present

## 2017-09-16 DIAGNOSIS — Z87891 Personal history of nicotine dependence: Secondary | ICD-10-CM | POA: Diagnosis not present

## 2017-09-16 DIAGNOSIS — O99512 Diseases of the respiratory system complicating pregnancy, second trimester: Secondary | ICD-10-CM | POA: Insufficient documentation

## 2017-09-16 DIAGNOSIS — W010XXA Fall on same level from slipping, tripping and stumbling without subsequent striking against object, initial encounter: Secondary | ICD-10-CM | POA: Diagnosis not present

## 2017-09-16 DIAGNOSIS — W19XXXA Unspecified fall, initial encounter: Secondary | ICD-10-CM | POA: Diagnosis not present

## 2017-09-16 DIAGNOSIS — O9989 Other specified diseases and conditions complicating pregnancy, childbirth and the puerperium: Secondary | ICD-10-CM

## 2017-09-16 DIAGNOSIS — Z283 Underimmunization status: Secondary | ICD-10-CM

## 2017-09-16 DIAGNOSIS — O98512 Other viral diseases complicating pregnancy, second trimester: Secondary | ICD-10-CM | POA: Insufficient documentation

## 2017-09-16 DIAGNOSIS — K5900 Constipation, unspecified: Secondary | ICD-10-CM

## 2017-09-16 DIAGNOSIS — Z79899 Other long term (current) drug therapy: Secondary | ICD-10-CM | POA: Insufficient documentation

## 2017-09-16 DIAGNOSIS — J45909 Unspecified asthma, uncomplicated: Secondary | ICD-10-CM | POA: Diagnosis not present

## 2017-09-16 DIAGNOSIS — Z3A22 22 weeks gestation of pregnancy: Secondary | ICD-10-CM

## 2017-09-16 DIAGNOSIS — O09899 Supervision of other high risk pregnancies, unspecified trimester: Secondary | ICD-10-CM

## 2017-09-16 DIAGNOSIS — Z3400 Encounter for supervision of normal first pregnancy, unspecified trimester: Secondary | ICD-10-CM

## 2017-09-16 DIAGNOSIS — Z2839 Other underimmunization status: Secondary | ICD-10-CM

## 2017-09-16 MED ORDER — POLYETHYLENE GLYCOL 3350 17 GM/SCOOP PO POWD
17.0000 g | Freq: Every day | ORAL | 0 refills | Status: DC
Start: 2017-09-16 — End: 2018-01-07

## 2017-09-16 MED ORDER — DOCUSATE SODIUM 100 MG PO CAPS: 100.0000 mg | ORAL_CAPSULE | Freq: Two times a day (BID) | ORAL | 2 refills | Status: DC | PRN

## 2017-09-16 NOTE — MAU Note (Signed)
Around 1800 fell in hall on hardwood floor when hurrying to kitchen. Fell on R side and hit R knee and R elbow. Some R side pain. Denies LOF or bleeding. Felt FM twice since fall.
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
## 2017-09-23 ENCOUNTER — Other Ambulatory Visit: Payer: Self-pay | Admitting: Obstetrics and Gynecology

## 2017-09-23 ENCOUNTER — Ambulatory Visit (HOSPITAL_COMMUNITY)
Admission: RE | Admit: 2017-09-23 | Discharge: 2017-09-23 | Disposition: A | Discharge status: Home / Self Care | Payer: Medicaid Other | Source: Ambulatory Visit | Attending: Obstetrics and Gynecology | Admitting: Obstetrics and Gynecology

## 2017-09-23 DIAGNOSIS — Z3400 Encounter for supervision of normal first pregnancy, unspecified trimester: Secondary | ICD-10-CM

## 2017-09-23 DIAGNOSIS — Z362 Encounter for other antenatal screening follow-up: Secondary | ICD-10-CM | POA: Insufficient documentation

## 2017-09-23 DIAGNOSIS — Z3A23 23 weeks gestation of pregnancy: Secondary | ICD-10-CM

## 2017-09-26 ENCOUNTER — Encounter: Payer: Self-pay | Admitting: Obstetrics and Gynecology

## 2017-09-26 ENCOUNTER — Ambulatory Visit (INDEPENDENT_AMBULATORY_CARE_PROVIDER_SITE_OTHER): Payer: Medicaid Other | Admitting: Obstetrics and Gynecology

## 2017-09-26 VITALS — BP 113/67 | HR 79 | Wt 158.8 lb

## 2017-09-26 DIAGNOSIS — Z3402 Encounter for supervision of normal first pregnancy, second trimester: Secondary | ICD-10-CM

## 2017-09-26 DIAGNOSIS — O9989 Other specified diseases and conditions complicating pregnancy, childbirth and the puerperium: Secondary | ICD-10-CM

## 2017-09-26 DIAGNOSIS — Z2839 Other underimmunization status: Secondary | ICD-10-CM

## 2017-09-26 DIAGNOSIS — Z3400 Encounter for supervision of normal first pregnancy, unspecified trimester: Secondary | ICD-10-CM

## 2017-09-26 DIAGNOSIS — Z283 Underimmunization status: Secondary | ICD-10-CM

## 2017-09-26 NOTE — Progress Notes (Signed)
   PRENATAL VISIT NOTE  Subjective:  Andrea Jenkins is a 21 y.o. G1P0000 at 4459w4d being seen today for ongoing prenatal care.  She is currently monitored for the following issues for this high-risk pregnancy and has Vaginitis and vulvovaginitis, unspecified; BV (bacterial vaginosis); UTI symptoms; LGSIL on Pap smear of cervix; Supervision of normal first teen pregnancy, unspecified trimester; and Rubella non-immune status, antepartum on their problem list.  Patient reports no complaints.  Contractions: Not present. Vag. Bleeding: None.  Movement: Present. Denies leaking of fluid.   The following portions of the patient's history were reviewed and updated as appropriate: allergies, current medications, past family history, past medical history, past social history, past surgical history and problem list. Problem list updated.  Objective:   Vitals:   09/26/17 0959  BP: 113/67  Pulse: 79  Weight: 158 lb 12.8 oz (72 kg)    Fetal Status: Fetal Heart Rate (bpm): 135 Fundal Height: 24 cm Movement: Present     General:  Alert, oriented and cooperative. Patient is in no acute distress.  Skin: Skin is warm and dry. No rash noted.   Cardiovascular: Normal heart rate noted  Respiratory: Normal respiratory effort, no problems with respiration noted  Abdomen: Soft, gravid, appropriate for gestational age.  Pain/Pressure: Absent     Pelvic: Cervical exam deferred        Extremities: Normal range of motion.     Mental Status:  Normal mood and affect. Normal behavior. Normal judgment and thought content.   Assessment and Plan:  Pregnancy: G1P0000 at 7459w4d  1. Supervision of normal first teen pregnancy, unspecified trimester Patient is doing well without complaints Third trimester labs next visit Patient is undecided on contraception  2. Rubella non-immune status, antepartum Will offer pp  Preterm labor symptoms and general obstetric precautions including but not limited to vaginal  bleeding, contractions, leaking of fluid and fetal movement were reviewed in detail with the patient. Please refer to After Visit Summary for other counseling recommendations.  Return in about 4 weeks (around 10/24/2017) for ROB, 2 hr glucola next visit.   Catalina AntiguaPeggy Akshara Blumenthal, MD

## 2017-10-10 ENCOUNTER — Encounter (HOSPITAL_COMMUNITY): Payer: Self-pay

## 2017-10-10 ENCOUNTER — Inpatient Hospital Stay (HOSPITAL_COMMUNITY)
Admission: AD | Admit: 2017-10-10 | Discharge: 2017-10-10 | Disposition: A | Discharge status: Home / Self Care | Payer: Medicaid Other | Source: Ambulatory Visit | Attending: Obstetrics and Gynecology | Admitting: Obstetrics and Gynecology

## 2017-10-10 DIAGNOSIS — O368190 Decreased fetal movements, unspecified trimester, not applicable or unspecified: Secondary | ICD-10-CM | POA: Diagnosis present

## 2017-10-10 DIAGNOSIS — Z283 Underimmunization status: Secondary | ICD-10-CM

## 2017-10-10 DIAGNOSIS — Z2839 Other underimmunization status: Secondary | ICD-10-CM

## 2017-10-10 DIAGNOSIS — O368120 Decreased fetal movements, second trimester, not applicable or unspecified: Secondary | ICD-10-CM

## 2017-10-10 DIAGNOSIS — Z3400 Encounter for supervision of normal first pregnancy, unspecified trimester: Secondary | ICD-10-CM

## 2017-10-10 DIAGNOSIS — R109 Unspecified abdominal pain: Secondary | ICD-10-CM | POA: Insufficient documentation

## 2017-10-10 DIAGNOSIS — Z87891 Personal history of nicotine dependence: Secondary | ICD-10-CM | POA: Diagnosis not present

## 2017-10-10 DIAGNOSIS — O9989 Other specified diseases and conditions complicating pregnancy, childbirth and the puerperium: Secondary | ICD-10-CM

## 2017-10-10 DIAGNOSIS — Z3A25 25 weeks gestation of pregnancy: Secondary | ICD-10-CM

## 2017-10-10 DIAGNOSIS — O09899 Supervision of other high risk pregnancies, unspecified trimester: Secondary | ICD-10-CM

## 2017-10-10 DIAGNOSIS — Z3689 Encounter for other specified antenatal screening: Secondary | ICD-10-CM

## 2017-10-10 DIAGNOSIS — O26899 Other specified pregnancy related conditions, unspecified trimester: Secondary | ICD-10-CM

## 2017-10-10 LAB — URINALYSIS, ROUTINE W REFLEX MICROSCOPIC
BILIRUBIN URINE: NEGATIVE
GLUCOSE, UA: NEGATIVE mg/dL
Hgb urine dipstick: NEGATIVE
KETONES UR: NEGATIVE mg/dL
Leukocytes, UA: NEGATIVE
Nitrite: NEGATIVE
PH: 7 (ref 5.0–8.0)
Protein, ur: NEGATIVE mg/dL
Specific Gravity, Urine: 1.01 (ref 1.005–1.030)

## 2017-10-10 LAB — WET PREP, GENITAL
Clue Cells Wet Prep HPF POC: NONE SEEN
Sperm: NONE SEEN
TRICH WET PREP: NONE SEEN
Yeast Wet Prep HPF POC: NONE SEEN

## 2017-10-10 NOTE — MAU Note (Signed)
Pt reports she has not felt baby move for a few says. Denies andy vag bleeding or discharge denies pain but c/o pelvic pressure.

## 2017-10-10 NOTE — MAU Provider Note (Signed)
History     CSN: [id number]  Arrival date and time: 10/10/17 [id number]   First Provider Initiated Contact with Patient 10/10/17 1012      Chief Complaint  Patient presents with  . Decreased Fetal Movement   HPI  Andrea Jenkins is a 21 y.o. G1P0000 at 9970w4d gestation presenting to MAU reporting no FM since Saturday and "lots of pelvic pressure."  Past Medical History:  Diagnosis Date  . Asthma   . Gonorrhea   . Heart murmur    when younger  . Vaginal Pap smear, abnormal     Past Surgical History:  Procedure Laterality Date  . WISDOM TOOTH EXTRACTION    . WISDOM TOOTH EXTRACTION    . WRIST SURGERY      Family History  Problem Relation Age of Onset  . Hypertension Mother   . Miscarriages / [place] Mother   . Hypertension Maternal Grandmother   . Asthma Maternal Grandmother   . Diabetes Maternal Grandmother   . Vision loss Maternal Grandmother   . Mental illness Brother   . Cancer Maternal Aunt   . Heart disease Neg Hx     Social History   Tobacco Use  . Smoking status: Former Smoker    Packs/day: 0.25    Types: Cigars    Last attempt to quit: 03/11/2017    Years since quitting: 0.5  . Smokeless tobacco: Never Used  Substance Use Topics  . Alcohol use: No    Alcohol/week: 0.0 oz  . Drug use: No    Allergies:  Allergies  Allergen Reactions  . Peach [Prunus Persica] Hives    Medications Prior to Admission  Medication Sig Dispense Refill Last Dose  . docusate sodium (COLACE) 100 MG capsule Take 1 capsule (100 mg total) by mouth 2 (two) times daily as needed. 30 capsule 2   . polyethylene glycol powder (GLYCOLAX/MIRALAX) powder Take 17 g by mouth daily. 255 g 0   . Prenat-Fe Poly-Methfol-FA-DHA (VITAFOL ULTRA) 29-0.6-0.4-200 MG CAPS Take 1 tablet by mouth daily. 30 capsule 12 09/16/2017 at Unknown time    Review of Systems  Constitutional: Negative.   HENT: Negative.   Eyes: Negative.   Respiratory: Positive for cough.   Cardiovascular:  Negative.   Gastrointestinal: Positive for nausea.  Endocrine: Negative.   Genitourinary: Positive for pelvic pain (pressure since Saturday). Negative for vaginal bleeding and vaginal discharge.  Musculoskeletal: Negative.   Skin: Negative.   Allergic/Immunologic: Negative.   Neurological: Negative.   Hematological: Negative.   Psychiatric/Behavioral: Negative.    Physical Exam   Blood pressure (!) 114/59, pulse 89, temperature 98.2 F (36.8 C), temperature source Oral, resp. rate 18, height 5\' 4"  (1.626 m), weight 163 lb (73.9 kg), last menstrual period 03/27/2017.  Physical Exam  Constitutional: She is oriented to person, place, and time. She appears well-developed and well-nourished.  HENT:  Head: Normocephalic.  Eyes: Pupils are equal, round, and reactive to light.  Neck: Normal range of motion.  Cardiovascular: Normal rate, regular rhythm and normal heart sounds.  Respiratory: Effort normal and breath sounds normal.  GI: Soft. Bowel sounds are normal.  Musculoskeletal: Normal range of motion.  Neurological: She is alert and oriented to person, place, and time.  Skin: Skin is warm and dry.  Psychiatric: She has a normal mood and affect. Her behavior is normal. Judgment and thought content normal.    MAU Course  Procedures  MDM CCUA Wet prep GC/CT HIV NST - FHR: 135 bpm / moderate  variability / accels present / decels absent / TOCO: none   Results for orders placed or performed during the hospital encounter of 10/10/17 (from the past 24 hour(s))  Urinalysis, Routine w reflex microscopic     Status: Abnormal   Collection Time: 10/10/17  9:33 AM  Result Value Ref Range   Color, Urine YELLOW YELLOW   APPearance CLOUDY (A) CLEAR   Specific Gravity, Urine 1.010 1.005 - 1.030   pH 7.0 5.0 - 8.0   Glucose, UA NEGATIVE NEGATIVE mg/dL   Hgb urine dipstick NEGATIVE NEGATIVE   Bilirubin Urine NEGATIVE NEGATIVE   Ketones, ur NEGATIVE NEGATIVE mg/dL   Protein, ur NEGATIVE  NEGATIVE mg/dL   Nitrite NEGATIVE NEGATIVE   Leukocytes, UA NEGATIVE NEGATIVE  Wet prep, genital     Status: Abnormal   Collection Time: 10/10/17 10:17 AM  Result Value Ref Range   Yeast Wet Prep HPF POC NONE SEEN NONE SEEN   Trich, Wet Prep NONE SEEN NONE SEEN   Clue Cells Wet Prep HPF POC NONE SEEN NONE SEEN   WBC, Wet Prep HPF POC FEW (A) NONE SEEN   Sperm NONE SEEN     Assessment and Plan  NST (non-stress test) reactive on fetal surveillance - Reassurance given that NST was reactive today and FM were appropriate for this gestation - [facility]   Abdominal pain affecting pregnancy - Reassurance that wet prep did not indicate vaginal infection - Information provided on abdominal pain in pregnancy  Discharge home Keep scheduled appt with [facility] on 12/31    Raelyn Moraolitta Herberto Ledwell, MSN, CNM 10/10/2017, 10:12 AM

## 2017-10-11 LAB — GC/CHLAMYDIA PROBE AMP (~~LOC~~) NOT AT ARMC
CHLAMYDIA, DNA PROBE: NEGATIVE
Neisseria Gonorrhea: NEGATIVE

## 2017-10-11 LAB — HIV ANTIBODY (ROUTINE TESTING W REFLEX): HIV SCREEN 4TH GENERATION: NONREACTIVE
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
## 2017-10-21 ENCOUNTER — Encounter: Payer: Self-pay | Admitting: Obstetrics and Gynecology

## 2017-10-21 ENCOUNTER — Other Ambulatory Visit: Payer: Medicaid Other

## 2017-10-21 ENCOUNTER — Other Ambulatory Visit: Payer: Self-pay

## 2017-10-21 ENCOUNTER — Ambulatory Visit (INDEPENDENT_AMBULATORY_CARE_PROVIDER_SITE_OTHER): Payer: Medicaid Other | Admitting: Obstetrics and Gynecology

## 2017-10-21 VITALS — BP 100/63 | HR 86 | Wt 162.6 lb

## 2017-10-21 DIAGNOSIS — O9989 Other specified diseases and conditions complicating pregnancy, childbirth and the puerperium: Secondary | ICD-10-CM

## 2017-10-21 DIAGNOSIS — Z3400 Encounter for supervision of normal first pregnancy, unspecified trimester: Secondary | ICD-10-CM

## 2017-10-21 DIAGNOSIS — Z2839 Other underimmunization status: Secondary | ICD-10-CM

## 2017-10-21 DIAGNOSIS — Z3402 Encounter for supervision of normal first pregnancy, second trimester: Secondary | ICD-10-CM

## 2017-10-21 DIAGNOSIS — Z283 Underimmunization status: Secondary | ICD-10-CM

## 2017-10-21 NOTE — Progress Notes (Signed)
   PRENATAL VISIT NOTE  Subjective:  Andrea Jenkins is a 21 y.o. G1P0000 at 8058w1d being seen today for ongoing prenatal care.  She is currently monitored for the following issues for this low-risk pregnancy and has LGSIL on Pap smear of cervix; Supervision of normal first teen pregnancy, unspecified trimester; Rubella non-immune status, antepartum; and Decreased fetal movement on their problem list.  Patient reports no complaints.  Contractions: Not present. Vag. Bleeding: None.  Movement: Present. Denies leaking of fluid.   The following portions of the patient's history were reviewed and updated as appropriate: allergies, current medications, past family history, past medical history, past social history, past surgical history and problem list. Problem list updated.  Objective:   Vitals:   10/21/17 0940  BP: 100/63  Pulse: 86  Weight: 162 lb 9.6 oz (73.8 kg)    Fetal Status: Fetal Heart Rate (bpm): 140 Fundal Height: 28 cm Movement: Present     General:  Alert, oriented and cooperative. Patient is in no acute distress.  Skin: Skin is warm and dry. No rash noted.   Cardiovascular: Normal heart rate noted  Respiratory: Normal respiratory effort, no problems with respiration noted  Abdomen: Soft, gravid, appropriate for gestational age.  Pain/Pressure: Present     Pelvic: Cervical exam deferred        Extremities: Normal range of motion.  Edema: None  Mental Status:  Normal mood and affect. Normal behavior. Normal judgment and thought content.   Assessment and Plan:  Pregnancy: G1P0000 at 3558w1d  1. Supervision of normal first teen pregnancy, unspecified trimester Patient is doing well without  Third trimester labs and glucola today Patient declined tdap Patient plans on donating kidney to her aunt post delivery  2. Rubella non-immune status, antepartum Will offer pp  Preterm labor symptoms and general obstetric precautions including but not limited to vaginal bleeding,  contractions, leaking of fluid and fetal movement were reviewed in detail with the patient. Please refer to After Visit Summary for other counseling recommendations.  Return in about 2 weeks (around 11/04/2017) for ROB.   Catalina AntiguaPeggy Pansey Pinheiro, MD

## 2017-10-21 NOTE — Progress Notes (Signed)
ROB/GTT.  Declined TDAP 10/21/17 Patient wants to donate her Kidney.

## 2017-10-22 LAB — CBC
Hematocrit: 32.6 % — ABNORMAL LOW (ref 34.0–46.6)
Hemoglobin: 10.7 g/dL — ABNORMAL LOW (ref 11.1–15.9)
MCH: 28.2 pg (ref 26.6–33.0)
MCHC: 32.8 g/dL (ref 31.5–35.7)
MCV: 86 fL (ref 79–97)
PLATELETS: 170 10*3/uL (ref 150–379)
RBC: 3.79 x10E6/uL (ref 3.77–5.28)
RDW: 14.1 % (ref 12.3–15.4)
WBC: 6.7 10*3/uL (ref 3.4–10.8)

## 2017-10-22 LAB — RPR: RPR: NONREACTIVE

## 2017-10-22 LAB — GLUCOSE TOLERANCE, 2 HOURS W/ 1HR
GLUCOSE, 1 HOUR: 112 mg/dL (ref 65–179)
GLUCOSE, FASTING: 66 mg/dL (ref 65–91)
Glucose, 2 hour: 88 mg/dL (ref 65–152)

## 2017-10-24 ENCOUNTER — Other Ambulatory Visit: Payer: Medicaid Other

## 2017-10-24 ENCOUNTER — Encounter: Payer: Medicaid Other | Admitting: Obstetrics and Gynecology

## 2017-10-25 NOTE — L&D Delivery Note (Signed)
Patient: Andrea Jenkins MRN: [id number]  GBS status: negative  Patient is a 22 y.o. now G1P1001 s/p NSVD at 6346w6d, who was admitted for active labor. AROM 4h 5944m prior to delivery with clear fluid.    Delivery Note At 12:15 PM a viable female was delivered via Vaginal, Spontaneous (Presentation: vertex; LOA ).  APGAR: 8, 9; weight pending Placenta status: intact, sent to L&D.  Cord: 3-vessel  Head delivered LOA. No nuchal cord present. Shoulder and body delivered in usual fashion. Infant with placed on mother's abdomen, dried and bulb suctioned, then infant with good cry and tone. Cord clamped x 2 after 1-minute delay, and cut by family member. Cord blood drawn. Placenta delivered spontaneously with gentle cord traction. Fundus firm with massage and Pitocin. Perineum inspected and found to have right periurethral laceration, which was repaired with 4.0 Monocryl with good hemostasis achieved.  Anesthesia:  Epidural Episiotomy: None Lacerations: Periurethral Suture Repair: 4.0 Monocryl Est. Blood Loss (mL): 150  Mom to postpartum.  Baby to Couplet care / Skin to Skin.  Raynelle FanningJulie P. Kallen Mccrystal, MD OB Fellow 01/18/18, 12:58 PM

## 2017-11-04 ENCOUNTER — Ambulatory Visit (INDEPENDENT_AMBULATORY_CARE_PROVIDER_SITE_OTHER): Payer: Medicaid Other | Admitting: Advanced Practice Midwife

## 2017-11-04 ENCOUNTER — Encounter: Payer: Self-pay | Admitting: Advanced Practice Midwife

## 2017-11-04 VITALS — BP 99/65 | HR 77 | Wt 164.0 lb

## 2017-11-04 DIAGNOSIS — Z283 Underimmunization status: Secondary | ICD-10-CM

## 2017-11-04 DIAGNOSIS — Z3400 Encounter for supervision of normal first pregnancy, unspecified trimester: Secondary | ICD-10-CM

## 2017-11-04 DIAGNOSIS — Z3403 Encounter for supervision of normal first pregnancy, third trimester: Secondary | ICD-10-CM

## 2017-11-04 DIAGNOSIS — O09899 Supervision of other high risk pregnancies, unspecified trimester: Secondary | ICD-10-CM

## 2017-11-04 DIAGNOSIS — O9989 Other specified diseases and conditions complicating pregnancy, childbirth and the puerperium: Secondary | ICD-10-CM

## 2017-11-04 NOTE — Progress Notes (Signed)
Pt states she has a keloid on her Lt breast from a nipple piercing. Want to know will this cause her not to breastfeed.  Pt is concerned about size/weight of baby.  Pt has planned to not have the baby umbilical chord cut right away at delivery.

## 2017-11-04 NOTE — Progress Notes (Signed)
   PRENATAL VISIT NOTE  Subjective:  Andrea Jenkins is a 22 y.o. G1P0000 at 55w1dbeing seen today for ongoing prenatal care.  She is currently monitored for the following issues for this low-risk pregnancy and has LGSIL on Pap smear of cervix; Supervision of normal first teen pregnancy, unspecified trimester; Rubella non-immune status, antepartum; and Decreased fetal movement on their problem list.  Patient reports no complaints. Asking if scar on nipple form previous piercing will affect breast feeding. Concerned about growth of baby. Wonders how much baby weighs.  Contractions: Not present. Vag. Bleeding: None.  Movement: Present. Denies leaking of fluid.   The following portions of the patient's history were reviewed and updated as appropriate: allergies, current medications, past family history, past medical history, past social history, past surgical history and problem list. Problem list updated.  Objective:   Vitals:   11/04/17 0934  BP: 99/65  Pulse: 77  Weight: 164 lb (74.4 kg)    Fetal Status: Fetal Heart Rate (bpm): 156   Movement: Present     General:  Alert, oriented and cooperative. Patient is in no acute distress.  Skin: Skin is warm and dry. No rash noted.   Cardiovascular: Normal heart rate noted  Respiratory: Normal respiratory effort, no problems with respiration noted  Abdomen: Soft, gravid, appropriate for gestational age.  Pain/Pressure: Absent     Pelvic: Cervical exam deferred        Extremities: Normal range of motion.  Edema: Trace  Mental Status:  Normal mood and affect. Normal behavior. Normal judgment and thought content.   Assessment and Plan:  Pregnancy: G1P0000 at 257w1d1. Supervision of normal first teen pregnancy, unspecified trimester  - Dicussed growth of baby, meeting expectation for weight gain and fundal height. - Tiny scar on nipple unlikely to affect BF. Reassured.   2. Rubella non-immune status, antepartum - MMR PP  Preterm labor  symptoms and general obstetric precautions including but not limited to vaginal bleeding, contractions, leaking of fluid and fetal movement were reviewed in detail with the patient. Please refer to After Visit Summary for other counseling recommendations.  Return in 2 weeks (on 11/18/2017).   ViManya SilvasCNM

## 2017-11-04 NOTE — Patient Instructions (Signed)
TDaP Vaccine Pregnancy Get the Whooping Cough Vaccine While You Are Pregnant (CDC)  It is important for women to get the whooping cough vaccine in the third trimester of each pregnancy. Vaccines are the best way to prevent this disease. There are 2 different whooping cough vaccines. Both vaccines combine protection against whooping cough, tetanus and diphtheria, but they are for different age groups: Tdap: for everyone 11 years or older, including pregnant women  DTaP: for children 2 months through 6 years of age  You need the whooping cough vaccine during each of your pregnancies The recommended time to get the shot is during your 27th through 36th week of pregnancy, preferably during the earlier part of this time period. The Centers for Disease Control and Prevention (CDC) recommends that pregnant women receive the whooping cough vaccine for adolescents and adults (called Tdap vaccine) during the third trimester of each pregnancy. The recommended time to get the shot is during your 27th through 36th week of pregnancy, preferably during the earlier part of this time period. This replaces the original recommendation that pregnant women get the vaccine only if they had not previously received it. The American College of Obstetricians and Gynecologists and the American College of Nurse-Midwives support this recommendation.  You should get the whooping cough vaccine while pregnant to pass protection to your baby frame support disabled and/or not supported in this browser  Learn why Andrea Jenkins decided to get the whooping cough vaccine in her 3rd trimester of pregnancy and how her baby girl was born with some protection against the disease. Also available on YouTube. After receiving the whooping cough vaccine, your body will create protective antibodies (proteins produced by the body to fight off diseases) and pass some of them to your baby before birth. These antibodies provide your baby some short-term  protection against whooping cough in early life. These antibodies can also protect your baby from some of the more serious complications that come along with whooping cough. Your protective antibodies are at their highest about 2 weeks after getting the vaccine, but it takes time to pass them to your baby. So the preferred time to get the whooping cough vaccine is early in your third trimester. The amount of whooping cough antibodies in your body decreases over time. That is why CDC recommends you get a whooping cough vaccine during each pregnancy. Doing so allows each of your babies to get the greatest number of protective antibodies from you. This means each of your babies will get the best protection possible against this disease.  Getting the whooping cough vaccine while pregnant is better than getting the vaccine after you give birth Whooping cough vaccination during pregnancy is ideal so your baby will have short-term protection as soon as he is born. This early protection is important because your baby will not start getting his whooping cough vaccines until he is 2 months old. These first few months of life are when your baby is at greatest risk for catching whooping cough. This is also when he's at greatest risk for having severe, potentially life-threating complications from the infection. To avoid that gap in protection, it is best to get a whooping cough vaccine during pregnancy. You will then pass protection to your baby before he is born. To continue protecting your baby, he should get whooping cough vaccines starting at 2 months old. You may never have gotten the Tdap vaccine before and did not get it during this pregnancy. If so, you should make sure   to get the vaccine immediately after you give birth, before leaving the hospital or birthing center. It will take about 2 weeks before your body develops protection (antibodies) in response to the vaccine. Once you have protection from the vaccine,  you are less likely to give whooping cough to your newborn while caring for him. But remember, your baby will still be at risk for catching whooping cough from others. A recent study looked to see how effective Tdap was at preventing whooping cough in babies whose mothers got the vaccine while pregnant or in the hospital after giving birth. The study found that getting Tdap between 27 through 36 weeks of pregnancy is 85% more effective at preventing whooping cough in babies younger than 2 months old. Blood tests cannot tell if you need a whooping cough vaccine There are no blood tests that can tell you if you have enough antibodies in your body to protect yourself or your baby against whooping cough. Even if you have been sick with whooping cough in the past or previously received the vaccine, you still should get the vaccine during each pregnancy. Breastfeeding may pass some protective antibodies onto your baby By breastfeeding, you may pass some antibodies you have made in response to the vaccine to your baby. When you get a whooping cough vaccine during your pregnancy, you will have antibodies in your breast milk that you can share with your baby as soon as your milk comes in. However, your baby will not get protective antibodies immediately if you wait to get the whooping cough vaccine until after delivering your baby. This is because it takes about 2 weeks for your body to create antibodies. Learn more about the health benefits of breastfeeding.   Contraception Choices Contraception, also called birth control, refers to methods or devices that prevent pregnancy. Hormonal methods Contraceptive implant A contraceptive implant is a thin, plastic tube that contains a hormone. It is inserted into the upper part of the arm. It can remain in place for up to 3 years. Progestin-only injections Progestin-only injections are injections of progestin, a synthetic form of the hormone progesterone. They are  given every 3 months by a health care provider. Birth control pills Birth control pills are pills that contain hormones that prevent pregnancy. They must be taken once a day, preferably at the same time each day. Birth control patch The birth control patch contains hormones that prevent pregnancy. It is placed on the skin and must be changed once a week for three weeks and removed on the fourth week. A prescription is needed to use this method of contraception. Vaginal ring A vaginal ring contains hormones that prevent pregnancy. It is placed in the vagina for three weeks and removed on the fourth week. After that, the process is repeated with a new ring. A prescription is needed to use this method of contraception. Emergency contraceptive Emergency contraceptives prevent pregnancy after unprotected sex. They come in pill form and can be taken up to 5 days after sex. They work best the sooner they are taken after having sex. Most emergency contraceptives are available without a prescription. This method should not be used as your only form of birth control. Barrier methods Female condom A female condom is a thin sheath that is worn over the penis during sex. Condoms keep sperm from going inside a woman's body. They can be used with a spermicide to increase their effectiveness. They should be disposed after a single use. Female condom A female   condom is a soft, loose-fitting sheath that is put into the vagina before sex. The condom keeps sperm from going inside a woman's body. They should be disposed after a single use. Diaphragm A diaphragm is a soft, dome-shaped barrier. It is inserted into the vagina before sex, along with a spermicide. The diaphragm blocks sperm from entering the uterus, and the spermicide kills sperm. A diaphragm should be left in the vagina for 6-8 hours after sex and removed within 24 hours. A diaphragm is prescribed and fitted by a health care provider. A diaphragm should be  replaced every 1-2 years, after giving birth, after gaining more than 15 lb (6.8 kg), and after pelvic surgery. Cervical cap A cervical cap is a round, soft latex or plastic cup that fits over the cervix. It is inserted into the vagina before sex, along with spermicide. It blocks sperm from entering the uterus. The cap should be left in place for 6-8 hours after sex and removed within 48 hours. A cervical cap must be prescribed and fitted by a health care provider. It should be replaced every 2 years. Sponge A sponge is a soft, circular piece of polyurethane foam with spermicide on it. The sponge helps block sperm from entering the uterus, and the spermicide kills sperm. To use it, you make it wet and then insert it into the vagina. It should be inserted before sex, left in for at least 6 hours after sex, and removed and thrown away within 30 hours. Spermicides Spermicides are chemicals that kill or block sperm from entering the cervix and uterus. They can come as a cream, jelly, suppository, foam, or tablet. A spermicide should be inserted into the vagina with an applicator at least 10-15 minutes before sex to allow time for it to work. The process must be repeated every time you have sex. Spermicides do not require a prescription. Intrauterine contraception Intrauterine device (IUD) An IUD is a T-shaped device that is put in a woman's uterus. There are two types:  Hormone IUD.This type contains progestin, a synthetic form of the hormone progesterone. This type can stay in place for 3-5 years.  Copper IUD.This type is wrapped in copper wire. It can stay in place for 10 years.  Permanent methods of contraception Female tubal ligation In this method, a woman's fallopian tubes are sealed, tied, or blocked during surgery to prevent eggs from traveling to the uterus. Hysteroscopic sterilization In this method, a small, flexible insert is placed into each fallopian tube. The inserts cause scar tissue  to form in the fallopian tubes and block them, so sperm cannot reach an egg. The procedure takes about 3 months to be effective. Another form of birth control must be used during those 3 months. Female sterilization This is a procedure to tie off the tubes that carry sperm (vasectomy). After the procedure, the man can still ejaculate fluid (semen). Natural planning methods Natural family planning In this method, a couple does not have sex on days when the woman could become pregnant. Calendar method This means keeping track of the length of each menstrual cycle, identifying the days when pregnancy can happen, and not having sex on those days. Ovulation method In this method, a couple avoids sex during ovulation. Symptothermal method This method involves not having sex during ovulation. The woman typically checks for ovulation by watching changes in her temperature and in the consistency of cervical mucus. Post-ovulation method In this method, a couple waits to have sex until after ovulation.   Summary  Contraception, also called birth control, means methods or devices that prevent pregnancy.  Hormonal methods of contraception include implants, injections, pills, patches, vaginal rings, and emergency contraceptives.  Barrier methods of contraception can include female condoms, female condoms, diaphragms, cervical caps, sponges, and spermicides.  There are two types of IUDs (intrauterine devices). An IUD can be put in a woman's uterus to prevent pregnancy for 3-5 years.  Permanent sterilization can be done through a procedure for males, females, or both.  Natural family planning methods involve not having sex on days when the woman could become pregnant. This information is not intended to replace advice given to you by your health care provider. Make sure you discuss any questions you have with your health care provider. Document Released: 10/11/2005 Document Revised: 11/13/2016 Document  Reviewed: 11/13/2016 Elsevier Interactive Patient Education  2018 Else[employer] Inc[employer]   

## 2017-11-16 ENCOUNTER — Encounter (HOSPITAL_COMMUNITY): Payer: Self-pay | Admitting: *Deleted

## 2017-11-16 ENCOUNTER — Inpatient Hospital Stay (HOSPITAL_COMMUNITY)
Admission: AD | Admit: 2017-11-16 | Discharge: 2017-11-16 | Disposition: A | Discharge status: Home / Self Care | Payer: Medicaid Other | Source: Ambulatory Visit | Attending: Obstetrics & Gynecology | Admitting: Obstetrics & Gynecology

## 2017-11-16 DIAGNOSIS — O4703 False labor before 37 completed weeks of gestation, third trimester: Secondary | ICD-10-CM | POA: Diagnosis not present

## 2017-11-16 DIAGNOSIS — O479 False labor, unspecified: Secondary | ICD-10-CM

## 2017-11-16 DIAGNOSIS — O26893 Other specified pregnancy related conditions, third trimester: Secondary | ICD-10-CM | POA: Insufficient documentation

## 2017-11-16 DIAGNOSIS — Z87891 Personal history of nicotine dependence: Secondary | ICD-10-CM | POA: Insufficient documentation

## 2017-11-16 DIAGNOSIS — R109 Unspecified abdominal pain: Secondary | ICD-10-CM | POA: Diagnosis not present

## 2017-11-16 DIAGNOSIS — Z3A31 31 weeks gestation of pregnancy: Secondary | ICD-10-CM | POA: Insufficient documentation

## 2017-11-16 DIAGNOSIS — R102 Pelvic and perineal pain: Secondary | ICD-10-CM

## 2017-11-16 DIAGNOSIS — O26899 Other specified pregnancy related conditions, unspecified trimester: Secondary | ICD-10-CM

## 2017-11-16 LAB — URINALYSIS, ROUTINE W REFLEX MICROSCOPIC
Bilirubin Urine: NEGATIVE
Glucose, UA: NEGATIVE mg/dL
Hgb urine dipstick: NEGATIVE
Ketones, ur: NEGATIVE mg/dL
LEUKOCYTES UA: NEGATIVE
Nitrite: NEGATIVE
PROTEIN: NEGATIVE mg/dL
Specific Gravity, Urine: 1.011 (ref 1.005–1.030)
pH: 8 (ref 5.0–8.0)

## 2017-11-16 NOTE — MAU Note (Signed)
Abdominal cramping on and off for the past hour.  Lasts 2-3 minutes when it comes on.  No VB/LOF/Discharge.  Reports good FM.

## 2017-11-16 NOTE — Discharge Instructions (Signed)
Braxton Hicks Contractions °Contractions of the uterus can occur throughout pregnancy, but they are not always a sign that you are in labor. You may have practice contractions called Braxton Hicks contractions. These false labor contractions are sometimes confused with true labor. °What are Braxton Hicks contractions? °Braxton Hicks contractions are tightening movements that occur in the muscles of the uterus before labor. Unlike true labor contractions, these contractions do not result in opening (dilation) and thinning of the cervix. Toward the end of pregnancy (32-34 weeks), Braxton Hicks contractions can happen more often and may become stronger. These contractions are sometimes difficult to tell apart from true labor because they can be very uncomfortable. You should not feel embarrassed if you go to the hospital with false labor. °Sometimes, the only way to tell if you are in true labor is for your health care provider to look for changes in the cervix. The health care provider will do a physical exam and may monitor your contractions. If you are not in true labor, the exam should show that your cervix is not dilating and your water has not broken. °If there are other health problems associated with your pregnancy, it is completely safe for you to be sent home with false labor. You may continue to have Braxton Hicks contractions until you go into true labor. °How to tell the difference between true labor and false labor °True labor °· Contractions last 30-70 seconds. °· Contractions become very regular. °· Discomfort is usually felt in the top of the uterus, and it spreads to the lower abdomen and low back. °· Contractions do not go away with walking. °· Contractions usually become more intense and increase in frequency. °· The cervix dilates and gets thinner. °False labor °· Contractions are usually shorter and not as strong as true labor contractions. °· Contractions are usually irregular. °· Contractions  are often felt in the front of the lower abdomen and in the groin. °· Contractions may go away when you walk around or change positions while lying down. °· Contractions get weaker and are shorter-lasting as time goes on. °· The cervix usually does not dilate or become thin. °Follow these instructions at home: °· Take over-the-counter and prescription medicines only as told by your health care provider. °· Keep up with your usual exercises and follow other instructions from your health care provider. °· Eat and drink lightly if you think you are going into labor. °· If Braxton Hicks contractions are making you uncomfortable: °? Change your position from lying down or resting to walking, or change from walking to resting. °? Sit and rest in a tub of warm water. °? Drink enough fluid to keep your urine pale yellow. Dehydration may cause these contractions. °? Do slow and deep breathing several times an hour. °· Keep all follow-up prenatal visits as told by your health care provider. This is important. °Contact a health care provider if: °· You have a fever. °· You have continuous pain in your abdomen. °Get help right away if: °· Your contractions become stronger, more regular, and closer together. °· You have fluid leaking or gushing from your vagina. °· You pass blood-tinged mucus (bloody show). °· You have bleeding from your vagina. °· You have low back pain that you never had before. °· You feel your baby’s head pushing down and causing pelvic pressure. °· Your baby is not moving inside you as much as it used to. °Summary °· Contractions that occur before labor are called Braxton   Hicks contractions, false labor, or practice contractions. °· Braxton Hicks contractions are usually shorter, weaker, farther apart, and less regular than true labor contractions. True labor contractions usually become progressively stronger and regular and they become more frequent. °· Manage discomfort from Braxton Hicks contractions by  changing position, resting in a warm bath, drinking plenty of water, or practicing deep breathing. °This information is not intended to replace advice given to you by your health care provider. Make sure you discuss any questions you have with your health care provider. °Document Released: 02/24/2017 Document Revised: 02/24/2017 Document Reviewed: 02/24/2017 °Elsevier Interactive Patient Education © 2018 [employer] Inc. ° °

## 2017-11-17 NOTE — MAU Provider Note (Signed)
Chief Complaint:  Contractions   First Provider Initiated Contact with Patient 11/16/17 2239      HPI: Andrea Jenkins is a 22 y.o. G1P0000 at [redacted]w[redacted]d who presents to MAU reporting contractions. Contractions started today. Patient endorsing abdominal cramping that is on and off. Cramping does not last long. Denies leakage of fluid, vaginal discharge, or vaginal bleeding. Good fetal movement.   Pregnancy Course:  [facility] Low-risk pregnancy  Past Medical History: Past Medical History:  Diagnosis Date  . Asthma   . Gonorrhea   . Heart murmur    when younger  . Vaginal Pap smear, abnormal     Past obstetric history: OB History  Gravida Para Term Preterm AB Living  1 0 0 0 0 0  SAB TAB Ectopic Multiple Live Births  0 0 0 0      # Outcome Date GA Lbr Len/2nd Weight Sex Delivery Anes PTL Lv  1 Current               Past Surgical History: Past Surgical History:  Procedure Laterality Date  . WISDOM TOOTH EXTRACTION    . WISDOM TOOTH EXTRACTION    . WRIST SURGERY       Family History: Family History  Problem Relation Age of Onset  . Hypertension Mother   . Miscarriages / [place] Mother   . Hypertension Maternal Grandmother   . Asthma Maternal Grandmother   . Diabetes Maternal Grandmother   . Vision loss Maternal Grandmother   . Mental illness Brother   . Cancer Maternal Aunt   . Heart disease Neg Hx     Social History: Social History   Tobacco Use  . Smoking status: Former Smoker    Packs/day: 0.25    Types: Cigars    Last attempt to quit: 03/11/2017    Years since quitting: 0.6  . Smokeless tobacco: Never Used  Substance Use Topics  . Alcohol use: No    Alcohol/week: 0.0 oz  . Drug use: No    Allergies:  Allergies  Allergen Reactions  . Peach [Prunus Persica] Hives    Meds:  No medications prior to admission.    I have reviewed patient's Past Medical Hx, Surgical Hx, Family Hx, Social Hx, medications and allergies.   ROS:  All systems  reviewed and are negative for acute change except as noted in the HPI.   Physical Exam   Patient Vitals for the past 24 hrs:  BP Temp Temp src Pulse Resp SpO2 Height Weight  11/16/17 2312 100/74 (!) 97.2 F (36.2 C) Axillary 86 18 - - -  11/16/17 2200 (!) 129/57 98.5 F (36.9 C) Oral 83 17 100 % 5\' 4"  (1.626 m) 78 kg (172 lb)   Constitutional: Well-developed, well-nourished female in no acute distress.  Cardiovascular: normal rate and rhythm, pulses intact Respiratory: normal rate and effort.  GI: Abd soft, non-tender, soft, gravid appropriate for gestational age.  MS: Extremities nontender, no edema, normal ROM Neurologic: Alert and oriented x 4.  Psych: normal mood and affect  Dilation: Closed Effacement (%): Thick Cervical Position: Posterior Station: -2 Presentation: Vertex Exam by:: dr.[email or web address]    Labs: Results for orders placed or performed during the hospital encounter of 11/16/17 (from the past 24 hour(s))  Urinalysis, Routine w reflex microscopic     Status: Abnormal   Collection Time: 11/16/17 10:04 PM  Result Value Ref Range   Color, Urine YELLOW YELLOW   APPearance HAZY (A) CLEAR   Specific Gravity, Urine 1.011  1.005 - 1.030   pH 8.0 5.0 - 8.0   Glucose, UA NEGATIVE NEGATIVE mg/dL   Hgb urine dipstick NEGATIVE NEGATIVE   Bilirubin Urine NEGATIVE NEGATIVE   Ketones, ur NEGATIVE NEGATIVE mg/dL   Protein, ur NEGATIVE NEGATIVE mg/dL   Nitrite NEGATIVE NEGATIVE   Leukocytes, UA NEGATIVE NEGATIVE    Imaging:  No results found.  MAU Course: Vitals and nursing notes reviewed UA unremarkable Declined swabs for cultures Treatments given in MAU: None  I personally reviewed the patient's NST today, found to be REACTIVE. 150 bpm, mod var, +accels, no decels. CTX: UI; no contractions.   MDM: Plan of care reviewed with patient, including labs and tests ordered and medical treatment.   Assessment: 1. Braxton Hicks contractions   2. Abdominal cramping affecting  pregnancy   3. Pelvic pressure in pregnancy     Plan: Discharge home in stable condition.  Preterm labor precautions and fetal kick counts reviewed Discussed need to stay hydrated to help with uterine irritability Handout given Follow-up with OB provider  Caryl AdaJazma Phelps, DO OB Fellow [facility]

## 2017-11-18 ENCOUNTER — Ambulatory Visit (INDEPENDENT_AMBULATORY_CARE_PROVIDER_SITE_OTHER): Payer: Medicaid Other | Admitting: Certified Nurse Midwife

## 2017-11-18 ENCOUNTER — Encounter: Payer: Self-pay | Admitting: Certified Nurse Midwife

## 2017-11-18 VITALS — BP 106/66 | HR 79 | Wt 168.0 lb

## 2017-11-18 DIAGNOSIS — Z3400 Encounter for supervision of normal first pregnancy, unspecified trimester: Secondary | ICD-10-CM

## 2017-11-18 DIAGNOSIS — O09899 Supervision of other high risk pregnancies, unspecified trimester: Secondary | ICD-10-CM

## 2017-11-18 DIAGNOSIS — Z283 Underimmunization status: Secondary | ICD-10-CM

## 2017-11-18 DIAGNOSIS — O9989 Other specified diseases and conditions complicating pregnancy, childbirth and the puerperium: Secondary | ICD-10-CM

## 2017-11-18 NOTE — Progress Notes (Signed)
   PRENATAL VISIT NOTE  Subjective:  Andrea Jenkins is a 22 y.o. G1P0000 at 4w1dbeing seen today for ongoing prenatal care.  She is currently monitored for the following issues for this low-risk pregnancy and has LGSIL on Pap smear of cervix; Supervision of normal first teen pregnancy, unspecified trimester; and Rubella non-immune status, antepartum on their problem list.  Patient reports no bleeding, no leaking and occasional contractions.  Contractions: Irregular. Vag. Bleeding: None.  Movement: Present. Denies leaking of fluid.   The following portions of the patient's history were reviewed and updated as appropriate: allergies, current medications, past family history, past medical history, past social history, past surgical history and problem list. Problem list updated.  Objective:   Vitals:   11/18/17 0950  BP: 106/66  Pulse: 79  Weight: 168 lb (76.2 kg)    Fetal Status: Fetal Heart Rate (bpm): 132-145; doppler, +FM noted Fundal Height: 31 cm Movement: Present     General:  Alert, oriented and cooperative. Patient is in no acute distress.  Skin: Skin is warm and dry. No rash noted.   Cardiovascular: Normal heart rate noted  Respiratory: Normal respiratory effort, no problems with respiration noted  Abdomen: Soft, gravid, appropriate for gestational age.  Pain/Pressure: Present     Pelvic: Cervical exam deferred        Extremities: Normal range of motion.  Edema: None  Mental Status:  Normal mood and affect. Normal behavior. Normal judgment and thought content.   Assessment and Plan:  Pregnancy: G1P0000 at 382w1d1. Supervision of normal first teen pregnancy, unspecified trimester     Patient desires to start maternity leave today.  States that her contractions and pelvic pressure are worse at work.  Was seen in MAU on 11/16/17 for braxton hicks contractions.  Is drinking water. Denies bleeding/LOF.  No contractions today.   2. Rubella non-immune status, antepartum  MMR Postpartum  Preterm labor symptoms and general obstetric precautions including but not limited to vaginal bleeding, contractions, leaking of fluid and fetal movement were reviewed in detail with the patient. Please refer to After Visit Summary for other counseling recommendations.  Return in about 2 weeks (around 12/02/2017) for ROB.   RaMorene CrockerCNM

## 2017-11-18 NOTE — Progress Notes (Signed)
Recent MAU visit  on 11/16/17 for braxton hicks  contractions.

## 2017-11-29 ENCOUNTER — Telehealth: Payer: Self-pay

## 2017-11-29 NOTE — Telephone Encounter (Signed)
TC to pt regarding message. Pt c/o: acid reflux, nausea, throat irritation from reflux. Pt has not tried any otc comfort measures pt advised to try tums and other heart burn medications as directed per protocol the  list of medications safe in pregnancy. Pt voiced understanding  Pt advised if no relief to contact the office.

## 2017-12-02 ENCOUNTER — Ambulatory Visit (INDEPENDENT_AMBULATORY_CARE_PROVIDER_SITE_OTHER): Payer: Medicaid Other | Admitting: Certified Nurse Midwife

## 2017-12-02 ENCOUNTER — Encounter: Payer: Self-pay | Admitting: Certified Nurse Midwife

## 2017-12-02 VITALS — BP 112/61 | HR 80 | Wt 173.7 lb

## 2017-12-02 DIAGNOSIS — O9989 Other specified diseases and conditions complicating pregnancy, childbirth and the puerperium: Secondary | ICD-10-CM

## 2017-12-02 DIAGNOSIS — Z3400 Encounter for supervision of normal first pregnancy, unspecified trimester: Secondary | ICD-10-CM

## 2017-12-02 DIAGNOSIS — Z3403 Encounter for supervision of normal first pregnancy, third trimester: Secondary | ICD-10-CM

## 2017-12-02 DIAGNOSIS — O99613 Diseases of the digestive system complicating pregnancy, third trimester: Secondary | ICD-10-CM

## 2017-12-02 DIAGNOSIS — K219 Gastro-esophageal reflux disease without esophagitis: Secondary | ICD-10-CM

## 2017-12-02 DIAGNOSIS — Z283 Underimmunization status: Secondary | ICD-10-CM

## 2017-12-02 DIAGNOSIS — Z2839 Other underimmunization status: Secondary | ICD-10-CM

## 2017-12-02 MED ORDER — OMEPRAZOLE 20 MG PO CPDR: 20.0000 mg | DELAYED_RELEASE_CAPSULE | Freq: Two times a day (BID) | ORAL | 5 refills | Status: DC

## 2017-12-02 NOTE — Progress Notes (Signed)
   PRENATAL VISIT NOTE  Subjective:  Andrea Jenkins is a 22 y.o. G1P0000 at 50w1dbeing seen today for ongoing prenatal care.  She is currently monitored for the following issues for this low-risk pregnancy and has LGSIL on Pap smear of cervix; Supervision of normal first pregnancy, antepartum; Rubella non-immune status, antepartum; and Gastroesophageal reflux during pregnancy, antepartum, third trimester on their problem list.  Patient reports heartburn, no bleeding, no contractions, no cramping and no leaking.  Contractions: Irregular. Vag. Bleeding: None.  Movement: Present. Denies leaking of fluid.   The following portions of the patient's history were reviewed and updated as appropriate: allergies, current medications, past family history, past medical history, past social history, past surgical history and problem list. Problem list updated.  Objective:   Vitals:   12/02/17 1001  BP: 112/61  Pulse: 80  Weight: 173 lb 11.2 oz (78.8 kg)    Fetal Status: Fetal Heart Rate (bpm): 140; doppler Fundal Height: 33 cm Movement: Present     General:  Alert, oriented and cooperative. Patient is in no acute distress.  Skin: Skin is warm and dry. No rash noted.   Cardiovascular: Normal heart rate noted  Respiratory: Normal respiratory effort, no problems with respiration noted  Abdomen: Soft, gravid, appropriate for gestational age.  Pain/Pressure: Present     Pelvic: Cervical exam deferred        Extremities: Normal range of motion.  Edema: Trace  Mental Status:  Normal mood and affect. Normal behavior. Normal judgment and thought content.   Assessment and Plan:  Pregnancy: G1P0000 at 36w1d1. Supervision of normal first pregnancy, antepartum     Doing well  2. Rubella non-immune status, antepartum     MMR postpartum  3. Gastroesophageal reflux during pregnancy, antepartum, third trimester     OTC TUMS, dietary management discussed. - omeprazole (PRILOSEC) 20 MG capsule; Take 1  capsule (20 mg total) by mouth 2 (two) times daily before a meal.  Dispense: 60 capsule; Refill: 5  Preterm labor symptoms and general obstetric precautions including but not limited to vaginal bleeding, contractions, leaking of fluid and fetal movement were reviewed in detail with the patient. Please refer to After Visit Summary for other counseling recommendations.  Return in about 2 weeks (around 12/16/2017) for ROB, GBS.   RaMorene CrockerCNM

## 2017-12-02 NOTE — Progress Notes (Signed)
Pt c/o lower abdominal pressure and acid reflux not helped by Tums.

## 2017-12-08 ENCOUNTER — Telehealth: Payer: Self-pay

## 2017-12-08 NOTE — Telephone Encounter (Signed)
Returned call, vm is not set up ?

## 2017-12-16 ENCOUNTER — Other Ambulatory Visit (HOSPITAL_COMMUNITY)
Admission: RE | Admit: 2017-12-16 | Discharge: 2017-12-16 | Disposition: A | Discharge status: Home / Self Care | Payer: Medicaid Other | Source: Ambulatory Visit | Attending: Certified Nurse Midwife | Admitting: Certified Nurse Midwife

## 2017-12-16 ENCOUNTER — Ambulatory Visit (INDEPENDENT_AMBULATORY_CARE_PROVIDER_SITE_OTHER): Payer: Medicaid Other | Admitting: Certified Nurse Midwife

## 2017-12-16 ENCOUNTER — Encounter: Payer: Self-pay | Admitting: Certified Nurse Midwife

## 2017-12-16 VITALS — BP 126/66 | HR 85 | Wt 179.2 lb

## 2017-12-16 DIAGNOSIS — H5440 Blindness, one eye, unspecified eye: Secondary | ICD-10-CM | POA: Insufficient documentation

## 2017-12-16 DIAGNOSIS — Z3403 Encounter for supervision of normal first pregnancy, third trimester: Secondary | ICD-10-CM | POA: Insufficient documentation

## 2017-12-16 DIAGNOSIS — Z2839 Other underimmunization status: Secondary | ICD-10-CM

## 2017-12-16 DIAGNOSIS — Z3400 Encounter for supervision of normal first pregnancy, unspecified trimester: Secondary | ICD-10-CM | POA: Diagnosis present

## 2017-12-16 DIAGNOSIS — Z283 Underimmunization status: Secondary | ICD-10-CM

## 2017-12-16 DIAGNOSIS — O9989 Other specified diseases and conditions complicating pregnancy, childbirth and the puerperium: Secondary | ICD-10-CM

## 2017-12-16 NOTE — Progress Notes (Signed)
Pt presents for ROB c/o buttock pressure.

## 2017-12-16 NOTE — Progress Notes (Signed)
   PRENATAL VISIT NOTE  Subjective:  Andrea Jenkins is a 22 y.o. G1P0000 at 7w1dbeing seen today for ongoing prenatal care.  She is currently monitored for the following issues for this low-risk pregnancy and has LGSIL on Pap smear of cervix; Supervision of normal first pregnancy, antepartum; Rubella non-immune status, antepartum; Gastroesophageal reflux during pregnancy, antepartum, third trimester; and Blindness of right eye on their problem list.  Patient reports no bleeding, no contractions, no cramping, no leaking and increased pelvic pressure, reports being shot in the right eye with a paintball a few months ago (has not mentioned this prior to today).  Contractions: Not present. Vag. Bleeding: None.  Movement: Present. Denies leaking of fluid.   The following portions of the patient's history were reviewed and updated as appropriate: allergies, current medications, past family history, past medical history, past social history, past surgical history and problem list. Problem list updated.  Objective:   Vitals:   12/16/17 0941  BP: 126/66  Pulse: 85  Weight: 179 lb 3.2 oz (81.3 kg)    Fetal Status: Fetal Heart Rate (bpm): 142; doppler Fundal Height: 34 cm Movement: Present  Presentation: Vertex  General:  Alert, oriented and cooperative. Patient is in no acute distress.  Skin: Skin is warm and dry. No rash noted.   Cardiovascular: Normal heart rate noted  Respiratory: Normal respiratory effort, no problems with respiration noted  Abdomen: Soft, gravid, appropriate for gestational age.  Pain/Pressure: Present     Pelvic: Cervical exam performed Dilation: Closed Effacement (%): Thick Station: -3  Extremities: Normal range of motion.  Edema: Trace  Mental Status:  Normal mood and affect. Normal behavior. Normal judgment and thought content.   Assessment and Plan:  Pregnancy: G1P0000 at 353w1d1. Supervision of normal first pregnancy, antepartum     Dong well -  Cervicovaginal ancillary only - Strep Gp B NAA  2. Rubella non-immune status, antepartum     MMR postpartum  3. Blindness of right eye     Shot in eye with paintball several months ago, has lost vision.  States was seen by ophthalmologist but does not have insurance.  AnHenderson Cloudhecking on f/u if any available.   Preterm labor symptoms and general obstetric precautions including but not limited to vaginal bleeding, contractions, leaking of fluid and fetal movement were reviewed in detail with the patient. Please refer to After Visit Summary for other counseling recommendations.  Return in about 1 week (around 12/23/2017) for ROFort Jones  RaMorene CrockerCNM

## 2017-12-18 LAB — STREP GP B NAA: STREP GROUP B AG: NEGATIVE

## 2017-12-19 ENCOUNTER — Telehealth: Payer: Self-pay

## 2017-12-19 DIAGNOSIS — H538 Other visual disturbances: Secondary | ICD-10-CM | POA: Diagnosis not present

## 2017-12-19 DIAGNOSIS — H26111 Localized traumatic opacities, right eye: Secondary | ICD-10-CM | POA: Diagnosis not present

## 2017-12-19 LAB — CERVICOVAGINAL ANCILLARY ONLY
Bacterial vaginitis: NEGATIVE
CANDIDA VAGINITIS: NEGATIVE
Chlamydia: NEGATIVE
Neisseria Gonorrhea: NEGATIVE
Trichomonas: NEGATIVE

## 2017-12-19 NOTE — Telephone Encounter (Signed)
Returned call to patient regarding Eye Surgery, she will verify with her Eye Doctor what exactly they will be doing and let us know.

## 2017-12-20 ENCOUNTER — Other Ambulatory Visit: Payer: Self-pay | Admitting: Certified Nurse Midwife

## 2017-12-20 DIAGNOSIS — Z3400 Encounter for supervision of normal first pregnancy, unspecified trimester: Secondary | ICD-10-CM

## 2017-12-23 ENCOUNTER — Encounter: Payer: Self-pay | Admitting: Certified Nurse Midwife

## 2017-12-23 ENCOUNTER — Ambulatory Visit (INDEPENDENT_AMBULATORY_CARE_PROVIDER_SITE_OTHER): Payer: Medicaid Other | Admitting: Certified Nurse Midwife

## 2017-12-23 VITALS — BP 138/80 | HR 94 | Wt 177.4 lb

## 2017-12-23 DIAGNOSIS — O99613 Diseases of the digestive system complicating pregnancy, third trimester: Secondary | ICD-10-CM

## 2017-12-23 DIAGNOSIS — Z3403 Encounter for supervision of normal first pregnancy, third trimester: Secondary | ICD-10-CM

## 2017-12-23 DIAGNOSIS — O9989 Other specified diseases and conditions complicating pregnancy, childbirth and the puerperium: Secondary | ICD-10-CM

## 2017-12-23 DIAGNOSIS — Z3400 Encounter for supervision of normal first pregnancy, unspecified trimester: Secondary | ICD-10-CM

## 2017-12-23 DIAGNOSIS — K219 Gastro-esophageal reflux disease without esophagitis: Secondary | ICD-10-CM

## 2017-12-23 DIAGNOSIS — O09899 Supervision of other high risk pregnancies, unspecified trimester: Secondary | ICD-10-CM

## 2017-12-23 DIAGNOSIS — Z283 Underimmunization status: Secondary | ICD-10-CM

## 2017-12-23 NOTE — Progress Notes (Signed)
Pt c/o pelvic pain and pressure, denies ctx

## 2017-12-23 NOTE — Progress Notes (Signed)
   PRENATAL VISIT NOTE  Subjective:  Andrea Jenkins is a 22 y.o. G1P0000 at 105w1dbeing seen today for ongoing prenatal care.  She is currently monitored for the following issues for this low-risk pregnancy and has LGSIL on Pap smear of cervix; Supervision of normal first pregnancy, antepartum; Rubella non-immune status, antepartum; Gastroesophageal reflux during pregnancy, antepartum, third trimester; and Blindness of right eye on their problem list.  Patient reports no bleeding, no contractions, no cramping, no leaking and pelvic pressure.  Contractions: Not present. Vag. Bleeding: None.  Movement: Present. Denies leaking of fluid.   The following portions of the patient's history were reviewed and updated as appropriate: allergies, current medications, past family history, past medical history, past social history, past surgical history and problem list. Problem list updated.  Objective:   Vitals:   12/23/17 1033  BP: 138/80  Pulse: 94  Weight: 177 lb 6.4 oz (80.5 kg)    Fetal Status: Fetal Heart Rate (bpm): 145; doppler Fundal Height: 36 cm Movement: Present  Presentation: Vertex  General:  Alert, oriented and cooperative. Patient is in no acute distress.  Skin: Skin is warm and dry. No rash noted.   Cardiovascular: Normal heart rate noted  Respiratory: Normal respiratory effort, no problems with respiration noted  Abdomen: Soft, gravid, appropriate for gestational age.  Pain/Pressure: Present     Pelvic: Cervical exam performed Dilation: Closed Effacement (%): 50 Station: -3  Extremities: Normal range of motion.  Edema: Trace  Mental Status:  Normal mood and affect. Normal behavior. Normal judgment and thought content.   Assessment and Plan:  Pregnancy: G1P0000 at 357w1d1. Supervision of normal first pregnancy, antepartum     Doing well  2. Rubella non-immune status, antepartum     MMR postpartum   3. Gastroesophageal reflux during pregnancy, antepartum, third  trimester     Stable, no complaints today  Preterm labor symptoms and general obstetric precautions including but not limited to vaginal bleeding, contractions, leaking of fluid and fetal movement were reviewed in detail with the patient. Please refer to After Visit Summary for other counseling recommendations.  Return in about 1 week (around 12/30/2017) for ROCrum  RaMorene CrockerCNM

## 2017-12-30 ENCOUNTER — Encounter: Payer: Self-pay | Admitting: Certified Nurse Midwife

## 2017-12-30 ENCOUNTER — Ambulatory Visit (INDEPENDENT_AMBULATORY_CARE_PROVIDER_SITE_OTHER): Payer: Medicaid Other | Admitting: Certified Nurse Midwife

## 2017-12-30 VITALS — BP 130/76 | HR 75 | Wt 183.0 lb

## 2017-12-30 DIAGNOSIS — O9989 Other specified diseases and conditions complicating pregnancy, childbirth and the puerperium: Secondary | ICD-10-CM

## 2017-12-30 DIAGNOSIS — Z283 Underimmunization status: Secondary | ICD-10-CM

## 2017-12-30 DIAGNOSIS — R87612 Low grade squamous intraepithelial lesion on cytologic smear of cervix (LGSIL): Secondary | ICD-10-CM

## 2017-12-30 DIAGNOSIS — O09899 Supervision of other high risk pregnancies, unspecified trimester: Secondary | ICD-10-CM

## 2017-12-30 DIAGNOSIS — O99613 Diseases of the digestive system complicating pregnancy, third trimester: Secondary | ICD-10-CM

## 2017-12-30 DIAGNOSIS — Z3400 Encounter for supervision of normal first pregnancy, unspecified trimester: Secondary | ICD-10-CM

## 2017-12-30 DIAGNOSIS — Z3403 Encounter for supervision of normal first pregnancy, third trimester: Secondary | ICD-10-CM

## 2017-12-30 DIAGNOSIS — K219 Gastro-esophageal reflux disease without esophagitis: Secondary | ICD-10-CM

## 2017-12-30 NOTE — Patient Instructions (Signed)
Contraception Choices Contraception, also called birth control, refers to methods or devices that prevent pregnancy. Hormonal methods Contraceptive implant A contraceptive implant is a thin, plastic tube that contains a hormone. It is inserted into the upper part of the arm. It can remain in place for up to 3 years. Progestin-only injections Progestin-only injections are injections of progestin, a synthetic form of the hormone progesterone. They are given every 3 months by a health care provider. Birth control pills Birth control pills are pills that contain hormones that prevent pregnancy. They must be taken once a day, preferably at the same time each day. Birth control patch The birth control patch contains hormones that prevent pregnancy. It is placed on the skin and must be changed once a week for three weeks and removed on the fourth week. A prescription is needed to use this method of contraception. Vaginal ring A vaginal ring contains hormones that prevent pregnancy. It is placed in the vagina for three weeks and removed on the fourth week. After that, the process is repeated with a new ring. A prescription is needed to use this method of contraception. Emergency contraceptive Emergency contraceptives prevent pregnancy after unprotected sex. They come in pill form and can be taken up to 5 days after sex. They work best the sooner they are taken after having sex. Most emergency contraceptives are available without a prescription. This method should not be used as your only form of birth control. Barrier methods Female condom A female condom is a thin sheath that is worn over the penis during sex. Condoms keep sperm from going inside a woman's body. They can be used with a spermicide to increase their effectiveness. They should be disposed after a single use. Female condom A female condom is a soft, loose-fitting sheath that is put into the vagina before sex. The condom keeps sperm from going  inside a woman's body. They should be disposed after a single use. Diaphragm A diaphragm is a soft, dome-shaped barrier. It is inserted into the vagina before sex, along with a spermicide. The diaphragm blocks sperm from entering the uterus, and the spermicide kills sperm. A diaphragm should be left in the vagina for 6-8 hours after sex and removed within 24 hours. A diaphragm is prescribed and fitted by a health care provider. A diaphragm should be replaced every 1-2 years, after giving birth, after gaining more than 15 lb (6.8 kg), and after pelvic surgery. Cervical cap A cervical cap is a round, soft latex or plastic cup that fits over the cervix. It is inserted into the vagina before sex, along with spermicide. It blocks sperm from entering the uterus. The cap should be left in place for 6-8 hours after sex and removed within 48 hours. A cervical cap must be prescribed and fitted by a health care provider. It should be replaced every 2 years. Sponge A sponge is a soft, circular piece of polyurethane foam with spermicide on it. The sponge helps block sperm from entering the uterus, and the spermicide kills sperm. To use it, you make it wet and then insert it into the vagina. It should be inserted before sex, left in for at least 6 hours after sex, and removed and thrown away within 30 hours. Spermicides Spermicides are chemicals that kill or block sperm from entering the cervix and uterus. They can come as a cream, jelly, suppository, foam, or tablet. A spermicide should be inserted into the vagina with an applicator at least 10-15 minutes before   sex to allow time for it to work. The process must be repeated every time you have sex. Spermicides do not require a prescription. Intrauterine contraception Intrauterine device (IUD) An IUD is a T-shaped device that is put in a woman's uterus. There are two types:  Hormone IUD.This type contains progestin, a synthetic form of the hormone progesterone. This  type can stay in place for 3-5 years.  Copper IUD.This type is wrapped in copper wire. It can stay in place for 10 years.  Permanent methods of contraception Female tubal ligation In this method, a woman's fallopian tubes are sealed, tied, or blocked during surgery to prevent eggs from traveling to the uterus. Hysteroscopic sterilization In this method, a small, flexible insert is placed into each fallopian tube. The inserts cause scar tissue to form in the fallopian tubes and block them, so sperm cannot reach an egg. The procedure takes about 3 months to be effective. Another form of birth control must be used during those 3 months. Female sterilization This is a procedure to tie off the tubes that carry sperm (vasectomy). After the procedure, the man can still ejaculate fluid (semen). Natural planning methods Natural family planning In this method, a couple does not have sex on days when the woman could become pregnant. Calendar method This means keeping track of the length of each menstrual cycle, identifying the days when pregnancy can happen, and not having sex on those days. Ovulation method In this method, a couple avoids sex during ovulation. Symptothermal method This method involves not having sex during ovulation. The woman typically checks for ovulation by watching changes in her temperature and in the consistency of cervical mucus. Post-ovulation method In this method, a couple waits to have sex until after ovulation. Summary  Contraception, also called birth control, means methods or devices that prevent pregnancy.  Hormonal methods of contraception include implants, injections, pills, patches, vaginal rings, and emergency contraceptives.  Barrier methods of contraception can include female condoms, female condoms, diaphragms, cervical caps, sponges, and spermicides.  There are two types of IUDs (intrauterine devices). An IUD can be put in a woman's uterus to prevent pregnancy  for 3-5 years.  Permanent sterilization can be done through a procedure for males, females, or both.  Natural family planning methods involve not having sex on days when the woman could become pregnant. This information is not intended to replace advice given to you by your health care provider. Make sure you discuss any questions you have with your health care provider. Document Released: 10/11/2005 Document Revised: 11/13/2016 Document Reviewed: 11/13/2016 Elsevier Interactive Patient Education  2018 Else[employer] Inc[employer]  

## 2017-12-30 NOTE — Progress Notes (Signed)
   PRENATAL VISIT NOTE  Subjective:  Andrea Jenkins is a 22 y.o. G1P0000 at 58w1dbeing seen today for ongoing prenatal care.  She is currently monitored for the following issues for this low-risk pregnancy and has LGSIL on Pap smear of cervix; Supervision of normal first pregnancy, antepartum; Rubella non-immune status, antepartum; Gastroesophageal reflux during pregnancy, antepartum, third trimester; and Blindness of right eye on their problem list.  Patient reports no bleeding, no contractions, no cramping, no leaking and increased back pain.  Contractions: Not present. Vag. Bleeding: None.  Movement: Present. Denies leaking of fluid.   The following portions of the patient's history were reviewed and updated as appropriate: allergies, current medications, past family history, past medical history, past social history, past surgical history and problem list. Problem list updated.  Objective:   Vitals:   12/30/17 1041  BP: 130/76  Pulse: 75  Weight: 183 lb (83 kg)    Fetal Status: Fetal Heart Rate (bpm): 143; doppler Fundal Height: 37 cm Movement: Present     General:  Alert, oriented and cooperative. Patient is in no acute distress.  Skin: Skin is warm and dry. No rash noted.   Cardiovascular: Normal heart rate noted  Respiratory: Normal respiratory effort, no problems with respiration noted  Abdomen: Soft, gravid, appropriate for gestational age.  Pain/Pressure: Present     Pelvic: Cervical exam deferred        Extremities: Normal range of motion.  Edema: Trace  Mental Status:  Normal mood and affect. Normal behavior. Normal judgment and thought content.   Assessment and Plan:  Pregnancy: G1P0000 at 324w1d1. Supervision of normal first pregnancy, antepartum     Doing well  2. Gastroesophageal reflux during pregnancy, antepartum, third trimester    Taking priolosec; stable  3. Rubella non-immune status, antepartum     MMR postpartum  4. LGSIL on Pap smear of cervix    Repeat postpartum, pap smear was done 07/31/16  Term labor symptoms and general obstetric precautions including but not limited to vaginal bleeding, contractions, leaking of fluid and fetal movement were reviewed in detail with the patient. Please refer to After Visit Summary for other counseling recommendations.  Return in about 1 week (around 01/06/2018) for RODayton  RaMorene CrockerCNM

## 2018-01-05 ENCOUNTER — Encounter: Payer: Self-pay | Admitting: Certified Nurse Midwife

## 2018-01-05 ENCOUNTER — Ambulatory Visit (INDEPENDENT_AMBULATORY_CARE_PROVIDER_SITE_OTHER): Payer: Medicaid Other | Admitting: Certified Nurse Midwife

## 2018-01-05 VITALS — BP 137/77 | HR 94 | Wt 184.0 lb

## 2018-01-05 DIAGNOSIS — O09899 Supervision of other high risk pregnancies, unspecified trimester: Secondary | ICD-10-CM

## 2018-01-05 DIAGNOSIS — O9989 Other specified diseases and conditions complicating pregnancy, childbirth and the puerperium: Secondary | ICD-10-CM

## 2018-01-05 DIAGNOSIS — Z3400 Encounter for supervision of normal first pregnancy, unspecified trimester: Secondary | ICD-10-CM

## 2018-01-05 DIAGNOSIS — O99891 Other specified diseases and conditions complicating pregnancy: Secondary | ICD-10-CM

## 2018-01-05 DIAGNOSIS — Z283 Underimmunization status: Secondary | ICD-10-CM

## 2018-01-05 NOTE — Progress Notes (Signed)
Pt had episode of increase in swelling in extremities and face on Monday-Tues.

## 2018-01-05 NOTE — Patient Instructions (Signed)
Braxton Hicks Contractions °Contractions of the uterus can occur throughout pregnancy, but they are not always a sign that you are in labor. You may have practice contractions called Braxton Hicks contractions. These false labor contractions are sometimes confused with true labor. °What are Braxton Hicks contractions? °Braxton Hicks contractions are tightening movements that occur in the muscles of the uterus before labor. Unlike true labor contractions, these contractions do not result in opening (dilation) and thinning of the cervix. Toward the end of pregnancy (32-34 weeks), Braxton Hicks contractions can happen more often and may become stronger. These contractions are sometimes difficult to tell apart from true labor because they can be very uncomfortable. You should not feel embarrassed if you go to the hospital with false labor. °Sometimes, the only way to tell if you are in true labor is for your health care provider to look for changes in the cervix. The health care provider will do a physical exam and may monitor your contractions. If you are not in true labor, the exam should show that your cervix is not dilating and your water has not broken. °If there are other health problems associated with your pregnancy, it is completely safe for you to be sent home with false labor. You may continue to have Braxton Hicks contractions until you go into true labor. °How to tell the difference between true labor and false labor °True labor °· Contractions last 30-70 seconds. °· Contractions become very regular. °· Discomfort is usually felt in the top of the uterus, and it spreads to the lower abdomen and low back. °· Contractions do not go away with walking. °· Contractions usually become more intense and increase in frequency. °· The cervix dilates and gets thinner. °False labor °· Contractions are usually shorter and not as strong as true labor contractions. °· Contractions are usually irregular. °· Contractions  are often felt in the front of the lower abdomen and in the groin. °· Contractions may go away when you walk around or change positions while lying down. °· Contractions get weaker and are shorter-lasting as time goes on. °· The cervix usually does not dilate or become thin. °Follow these instructions at home: °· Take over-the-counter and prescription medicines only as told by your health care provider. °· Keep up with your usual exercises and follow other instructions from your health care provider. °· Eat and drink lightly if you think you are going into labor. °· If Braxton Hicks contractions are making you uncomfortable: °? Change your position from lying down or resting to walking, or change from walking to resting. °? Sit and rest in a tub of warm water. °? Drink enough fluid to keep your urine pale yellow. Dehydration may cause these contractions. °? Do slow and deep breathing several times an hour. °· Keep all follow-up prenatal visits as told by your health care provider. This is important. °Contact a health care provider if: °· You have a fever. °· You have continuous pain in your abdomen. °Get help right away if: °· Your contractions become stronger, more regular, and closer together. °· You have fluid leaking or gushing from your vagina. °· You pass blood-tinged mucus (bloody show). °· You have bleeding from your vagina. °· You have low back pain that you never had before. °· You feel your baby’s head pushing down and causing pelvic pressure. °· Your baby is not moving inside you as much as it used to. °Summary °· Contractions that occur before labor are called Braxton   Hicks contractions, false labor, or practice contractions. °· Braxton Hicks contractions are usually shorter, weaker, farther apart, and less regular than true labor contractions. True labor contractions usually become progressively stronger and regular and they become more frequent. °· Manage discomfort from Braxton Hicks contractions by  changing position, resting in a warm bath, drinking plenty of water, or practicing deep breathing. °This information is not intended to replace advice given to you by your health care provider. Make sure you discuss any questions you have with your health care provider. °Document Released: 02/24/2017 Document Revised: 02/24/2017 Document Reviewed: 02/24/2017 °Elsevier Interactive Patient Education © 2018 Else[employer] Inc. ° °

## 2018-01-05 NOTE — Progress Notes (Signed)
   PRENATAL VISIT NOTE  Subjective:  Andrea Jenkins is a 22 y.o. G1P0000 at 7154w0d being seen today for ongoing prenatal care.  She is currently monitored for the following issues for this low-risk pregnancy and has LGSIL on Pap smear of cervix; Supervision of normal first pregnancy, antepartum; Rubella non-immune status, antepartum; Gastroesophageal reflux during pregnancy, antepartum, third trimester; and Blindness of right eye on their problem list.  Patient reports swelling of legs and face. Pt reports it lasted for 2 days then resolved. Denies HA, vision changes, or epigastric pain.  Contractions: Not present. Vag. Bleeding: None.  Movement: Present. Denies leaking of fluid.   The following portions of the patient's history were reviewed and updated as appropriate: allergies, current medications, past family history, past medical history, past social history, past surgical history and problem list. Problem list updated.  Objective:   Vitals:   01/05/18 1409  BP: 137/77  Pulse: 94  Weight: 184 lb (83.5 kg)    Fetal Status: Fetal Heart Rate (bpm): 148 Fundal Height: 35 cm Movement: Present     General:  Alert, oriented and cooperative. Patient is in no acute distress.  Skin: Skin is warm and dry. No rash noted.   Cardiovascular: Normal heart rate noted  Respiratory: Normal respiratory effort, no problems with respiration noted  Abdomen: Soft, gravid, appropriate for gestational age.  Pain/Pressure: Absent     Pelvic: Cervical exam deferred        Extremities: Normal range of motion.  Edema: Trace  Mental Status:  Normal mood and affect. Normal behavior. Normal judgment and thought content.   Assessment and Plan:  Pregnancy: G1P0000 at 254w0d  1. Supervision of normal first pregnancy, antepartum -Pt doing well -Discussed reason to call office if s/s or preeclampsia with increased swelling. Educated on increasing amount of water and elevate legs when sitting.   2. Rubella  non-immune status, antepartum   Term labor symptoms and general obstetric precautions including but not limited to vaginal bleeding, contractions, leaking of fluid and fetal movement were reviewed in detail with the patient. Please refer to After Visit Summary for other counseling recommendations.  Return in about 1 week (around 01/12/2018) for ROB.   Sharyon CableVeronica C Tatayana Beshears, CNM

## 2018-01-07 ENCOUNTER — Encounter (HOSPITAL_COMMUNITY): Payer: Self-pay

## 2018-01-07 ENCOUNTER — Inpatient Hospital Stay (HOSPITAL_COMMUNITY)
Admission: AD | Admit: 2018-01-07 | Discharge: 2018-01-07 | Disposition: A | Discharge status: Home / Self Care | Payer: Medicaid Other | Source: Ambulatory Visit | Attending: Internal Medicine | Admitting: Internal Medicine

## 2018-01-07 ENCOUNTER — Other Ambulatory Visit: Payer: Self-pay

## 2018-01-07 DIAGNOSIS — Z3A29 29 weeks gestation of pregnancy: Secondary | ICD-10-CM | POA: Insufficient documentation

## 2018-01-07 DIAGNOSIS — Z8249 Family history of ischemic heart disease and other diseases of the circulatory system: Secondary | ICD-10-CM | POA: Diagnosis not present

## 2018-01-07 DIAGNOSIS — R609 Edema, unspecified: Secondary | ICD-10-CM

## 2018-01-07 DIAGNOSIS — Z809 Family history of malignant neoplasm, unspecified: Secondary | ICD-10-CM | POA: Diagnosis not present

## 2018-01-07 DIAGNOSIS — O1203 Gestational edema, third trimester: Secondary | ICD-10-CM | POA: Insufficient documentation

## 2018-01-07 DIAGNOSIS — Z833 Family history of diabetes mellitus: Secondary | ICD-10-CM | POA: Diagnosis not present

## 2018-01-07 DIAGNOSIS — Z9889 Other specified postprocedural states: Secondary | ICD-10-CM | POA: Diagnosis not present

## 2018-01-07 DIAGNOSIS — M7989 Other specified soft tissue disorders: Secondary | ICD-10-CM | POA: Diagnosis present

## 2018-01-07 DIAGNOSIS — Z3A38 38 weeks gestation of pregnancy: Secondary | ICD-10-CM | POA: Diagnosis not present

## 2018-01-07 DIAGNOSIS — Z91018 Allergy to other foods: Secondary | ICD-10-CM | POA: Diagnosis not present

## 2018-01-07 DIAGNOSIS — Z87891 Personal history of nicotine dependence: Secondary | ICD-10-CM | POA: Diagnosis not present

## 2018-01-07 DIAGNOSIS — Z8619 Personal history of other infectious and parasitic diseases: Secondary | ICD-10-CM | POA: Diagnosis not present

## 2018-01-07 LAB — COMPREHENSIVE METABOLIC PANEL
ALT: 12 U/L — ABNORMAL LOW (ref 14–54)
AST: 20 U/L (ref 15–41)
Albumin: 3.1 g/dL — ABNORMAL LOW (ref 3.5–5.0)
Alkaline Phosphatase: 139 U/L — ABNORMAL HIGH (ref 38–126)
Anion gap: 8 (ref 5–15)
BUN: <5 mg/dL — ABNORMAL LOW (ref 6–20)
CO2: 19 mmol/L — ABNORMAL LOW (ref 22–32)
Calcium: 9.3 mg/dL (ref 8.9–10.3)
Chloride: 106 mmol/L (ref 101–111)
Creatinine, Ser: 0.54 mg/dL (ref 0.44–1.00)
GFR calc Af Amer: >60 mL/min (ref >60)
GFR calc non Af Amer: >60 mL/min (ref >60)
Glucose, Bld: 76 mg/dL (ref 65–99)
Potassium: 3.6 mmol/L (ref 3.5–5.1)
Sodium: 133 mmol/L — ABNORMAL LOW (ref 135–145)
Total Bilirubin: 0.5 mg/dL (ref 0.3–1.2)
Total Protein: 6.9 g/dL (ref 6.5–8.1)

## 2018-01-07 LAB — URINALYSIS, ROUTINE W REFLEX MICROSCOPIC
Bilirubin Urine: NEGATIVE
Glucose, UA: NEGATIVE mg/dL
Hgb urine dipstick: NEGATIVE
Ketones, ur: NEGATIVE mg/dL
Nitrite: NEGATIVE
Protein, ur: NEGATIVE mg/dL
SPECIFIC GRAVITY, URINE: 1.005 (ref 1.005–1.030)
pH: 7 (ref 5.0–8.0)

## 2018-01-07 LAB — CBC
HCT: 30.5 % — ABNORMAL LOW (ref 36.0–46.0)
Hemoglobin: 10.2 g/dL — ABNORMAL LOW (ref 12.0–15.0)
MCH: 27.8 pg (ref 26.0–34.0)
MCHC: 33.4 g/dL (ref 30.0–36.0)
MCV: 83.1 fL (ref 78.0–100.0)
Platelets: 136 10*3/uL — ABNORMAL LOW (ref 150–400)
RBC: 3.67 MIL/uL — ABNORMAL LOW (ref 3.87–5.11)
RDW: 14.6 % (ref 11.5–15.5)
WBC: 9.3 10*3/uL (ref 4.0–10.5)

## 2018-01-07 LAB — PROTEIN / CREATININE RATIO, URINE
Creatinine, Urine: 51 mg/dL
Protein Creatinine Ratio: 0.14 mg/mg{Cre} (ref 0.00–0.15)
Total Protein, Urine: 7 mg/dL

## 2018-01-07 NOTE — MAU Provider Note (Addendum)
History   Andrea Jenkins is a 22 year old G1 at 74+2 who presents for swelling. She denies HA, epigastric pain, and visual disturbances. States the swelling occurs randomly. Endorses GFM and no NB. Might have lost her mucous plug, but no watery discharge.   CSN: [id number]  Arrival date and time: 01/07/18 1319   None     Chief Complaint  Patient presents with  . Leg Swelling   HPI    Past Medical History:  Diagnosis Date  . Asthma   . Gonorrhea   . Heart murmur    when younger  . Vaginal Pap smear, abnormal     Past Surgical History:  Procedure Laterality Date  . WISDOM TOOTH EXTRACTION    . WISDOM TOOTH EXTRACTION    . WRIST SURGERY      Family History  Problem Relation Age of Onset  . Hypertension Mother   . Miscarriages / [place] Mother   . Hypertension Maternal Grandmother   . Asthma Maternal Grandmother   . Diabetes Maternal Grandmother   . Vision loss Maternal Grandmother   . Mental illness Brother   . Cancer Maternal Aunt   . Heart disease Neg Hx     Social History   Tobacco Use  . Smoking status: Former Smoker    Packs/day: 0.25    Types: Cigars    Last attempt to quit: 03/11/2017    Years since quitting: 0.8  . Smokeless tobacco: Never Used  Substance Use Topics  . Alcohol use: No    Alcohol/week: 0.0 oz  . Drug use: No    Allergies:  Allergies  Allergen Reactions  . Peach [Prunus Persica] Hives    Medications Prior to Admission  Medication Sig Dispense Refill Last Dose  . docusate sodium (COLACE) 100 MG capsule Take 1 capsule (100 mg total) by mouth 2 (two) times daily as needed. (Patient not taking: Reported on 12/30/2017) 30 capsule 2 Not Taking  . omeprazole (PRILOSEC) 20 MG capsule Take 1 capsule (20 mg total) by mouth 2 (two) times daily before a meal. 60 capsule 5 Taking  . polyethylene glycol powder (GLYCOLAX/MIRALAX) powder Take 17 g by mouth daily. (Patient not taking: Reported on 12/30/2017) 255 g 0 Not Taking  . Prenat-Fe  Poly-Methfol-FA-DHA (VITAFOL ULTRA) 29-0.6-0.4-200 MG CAPS Take 1 tablet by mouth daily. 30 capsule 12 Taking    Review of Systems  Constitutional: Negative.   HENT: Negative.   Eyes: Negative.   Respiratory: Negative.   Cardiovascular:       Feet and hands swollen  Gastrointestinal: Negative.   Endocrine: Negative.   Genitourinary: Negative.   Musculoskeletal: Negative.   Skin: Negative.   Allergic/Immunologic: Negative.   Neurological: Negative.   Hematological: Negative.   Psychiatric/Behavioral: Negative.    Physical Exam   Last menstrual period 03/27/2017.  Physical Exam  Constitutional: She is oriented to person, place, and time. She appears well-developed and well-nourished.  HENT:  Head: Normocephalic.  Eyes: Pupils are equal, round, and reactive to light.  Neck: Normal range of motion.  Cardiovascular: Normal rate and regular rhythm.  Respiratory: Effort normal.  GI: Soft.  Musculoskeletal: Normal range of motion.  Neurological: She is alert and oriented to person, place, and time. She has normal reflexes.  Skin: Skin is warm and dry.  Psychiatric: She has a normal mood and affect.    MAU Course  Procedures  MDM CBC, CMP, p:c ratio obtained  Assessment and Plan  Trace swelling of hands and feet- non-pitting.  DTRs WNL, no clonus, breath sounds clear. BP 134/68. UA negative. FHR baseline 145 with 15x15 accels, no decels and moderate variability. No contractions noted. CBC hgb 10.2, CMP WNL. P:C ratio .014. Discharge home with preeclampsia and labor precautions.  Phillis Haggislexis H Pruitt 01/07/2018, 1:56 PM  I assessed this pt and agree with above assessment

## 2018-01-07 NOTE — Discharge Instructions (Signed)
Braxton Hicks Contractions °Contractions of the uterus can occur throughout pregnancy, but they are not always a sign that you are in labor. You may have practice contractions called Braxton Hicks contractions. These false labor contractions are sometimes confused with true labor. °What are Braxton Hicks contractions? °Braxton Hicks contractions are tightening movements that occur in the muscles of the uterus before labor. Unlike true labor contractions, these contractions do not result in opening (dilation) and thinning of the cervix. Toward the end of pregnancy (32-34 weeks), Braxton Hicks contractions can happen more often and may become stronger. These contractions are sometimes difficult to tell apart from true labor because they can be very uncomfortable. You should not feel embarrassed if you go to the hospital with false labor. °Sometimes, the only way to tell if you are in true labor is for your health care provider to look for changes in the cervix. The health care provider will do a physical exam and may monitor your contractions. If you are not in true labor, the exam should show that your cervix is not dilating and your water has not broken. °If there are other health problems associated with your pregnancy, it is completely safe for you to be sent home with false labor. You may continue to have Braxton Hicks contractions until you go into true labor. °How to tell the difference between true labor and false labor °True labor °· Contractions last 30-70 seconds. °· Contractions become very regular. °· Discomfort is usually felt in the top of the uterus, and it spreads to the lower abdomen and low back. °· Contractions do not go away with walking. °· Contractions usually become more intense and increase in frequency. °· The cervix dilates and gets thinner. °False labor °· Contractions are usually shorter and not as strong as true labor contractions. °· Contractions are usually irregular. °· Contractions  are often felt in the front of the lower abdomen and in the groin. °· Contractions may go away when you walk around or change positions while lying down. °· Contractions get weaker and are shorter-lasting as time goes on. °· The cervix usually does not dilate or become thin. °Follow these instructions at home: °· Take over-the-counter and prescription medicines only as told by your health care provider. °· Keep up with your usual exercises and follow other instructions from your health care provider. °· Eat and drink lightly if you think you are going into labor. °· If Braxton Hicks contractions are making you uncomfortable: °? Change your position from lying down or resting to walking, or change from walking to resting. °? Sit and rest in a tub of warm water. °? Drink enough fluid to keep your urine pale yellow. Dehydration may cause these contractions. °? Do slow and deep breathing several times an hour. °· Keep all follow-up prenatal visits as told by your health care provider. This is important. °Contact a health care provider if: °· You have a fever. °· You have continuous pain in your abdomen. °Get help right away if: °· Your contractions become stronger, more regular, and closer together. °· You have fluid leaking or gushing from your vagina. °· You pass blood-tinged mucus (bloody show). °· You have bleeding from your vagina. °· You have low back pain that you never had before. °· You feel your baby’s head pushing down and causing pelvic pressure. °· Your baby is not moving inside you as much as it used to. °Summary °· Contractions that occur before labor are called Braxton   Hicks contractions, false labor, or practice contractions. °· Braxton Hicks contractions are usually shorter, weaker, farther apart, and less regular than true labor contractions. True labor contractions usually become progressively stronger and regular and they become more frequent. °· Manage discomfort from Braxton Hicks contractions by  changing position, resting in a warm bath, drinking plenty of water, or practicing deep breathing. °This information is not intended to replace advice given to you by your health care provider. Make sure you discuss any questions you have with your health care provider. °Document Released: 02/24/2017 Document Revised: 02/24/2017 Document Reviewed: 02/24/2017 °Elsevier Interactive Patient Education © 2018 [employer] Inc. ° °

## 2018-01-07 NOTE — MAU Note (Addendum)
G1 @ 38.[redacted] wksga. Here dt swelling that she addressed to her OB provider on Thursday. Denies LOF or bleeding. +FM   No swelling noted on legs or hands but pt states it is swollen.   EFM applied.   Provider at bs assessing  1507: d/c instructions given with pt understanding. Pt left unit via ambulatory with her mother.

## 2018-01-09 ENCOUNTER — Telehealth: Payer: Self-pay

## 2018-01-09 NOTE — Telephone Encounter (Signed)
Return call from patient regarding message c/o swelling. Pt denies any visual changes c/o constant HA's even at night Pt advised to increase fluid and elevate feet and reduce salt intake  Pt states she has done all the things I mentioned and swelling has got worse. Pt advised to go to MAU for Eval Pt voiced understanding.

## 2018-01-10 ENCOUNTER — Ambulatory Visit (INDEPENDENT_AMBULATORY_CARE_PROVIDER_SITE_OTHER): Payer: Medicaid Other | Admitting: Certified Nurse Midwife

## 2018-01-10 VITALS — BP 129/84 | HR 91 | Wt 189.0 lb

## 2018-01-10 DIAGNOSIS — Z283 Underimmunization status: Secondary | ICD-10-CM

## 2018-01-10 DIAGNOSIS — K219 Gastro-esophageal reflux disease without esophagitis: Secondary | ICD-10-CM

## 2018-01-10 DIAGNOSIS — Z3403 Encounter for supervision of normal first pregnancy, third trimester: Secondary | ICD-10-CM

## 2018-01-10 DIAGNOSIS — O26893 Other specified pregnancy related conditions, third trimester: Secondary | ICD-10-CM

## 2018-01-10 DIAGNOSIS — R51 Headache: Secondary | ICD-10-CM

## 2018-01-10 DIAGNOSIS — O99613 Diseases of the digestive system complicating pregnancy, third trimester: Secondary | ICD-10-CM

## 2018-01-10 DIAGNOSIS — Z3400 Encounter for supervision of normal first pregnancy, unspecified trimester: Secondary | ICD-10-CM

## 2018-01-10 DIAGNOSIS — O9989 Other specified diseases and conditions complicating pregnancy, childbirth and the puerperium: Secondary | ICD-10-CM

## 2018-01-10 DIAGNOSIS — O09899 Supervision of other high risk pregnancies, unspecified trimester: Secondary | ICD-10-CM

## 2018-01-10 DIAGNOSIS — R519 Headache, unspecified: Secondary | ICD-10-CM

## 2018-01-10 MED ORDER — BUTALBITAL-APAP-CAFFEINE 50-325-40 MG PO TABS: 1.0000 | ORAL_TABLET | Freq: Four times a day (QID) | ORAL | 1 refills | Status: DC | PRN

## 2018-01-10 NOTE — Progress Notes (Signed)
Pt states increase in LE swelling and HA.

## 2018-01-10 NOTE — Progress Notes (Signed)
   PRENATAL VISIT NOTE  Subjective:  Andrea Jenkins is a 22 y.o. G1P0000 at 65w5dbeing seen today for ongoing prenatal care.  She is currently monitored for the following issues for this low-risk pregnancy and has LGSIL on Pap smear of cervix; Supervision of normal first pregnancy, antepartum; Rubella non-immune status, antepartum; Gastroesophageal reflux during pregnancy, antepartum, third trimester; and Blindness of right eye on their problem list.  Patient reports headache, no bleeding, no leaking, occasional contractions and lower leg swelling with activity.  Contractions: Irregular. Vag. Bleeding: None.  Movement: Present. Denies leaking of fluid.   The following portions of the patient's history were reviewed and updated as appropriate: allergies, current medications, past family history, past medical history, past social history, past surgical history and problem list. Problem list updated.  Objective:   Vitals:   01/10/18 1556  BP: 129/84  Pulse: 91  Weight: 189 lb (85.7 kg)    Fetal Status: Fetal Heart Rate (bpm): 141; doppler Fundal Height: 37 cm Movement: Present     General:  Alert, oriented and cooperative. Patient is in no acute distress.  Skin: Skin is warm and dry. No rash noted.   Cardiovascular: Normal heart rate noted  Respiratory: Normal respiratory effort, no problems with respiration noted  Abdomen: Soft, gravid, appropriate for gestational age.  Pain/Pressure: Absent     Pelvic: Cervical exam deferred        Extremities: Normal range of motion.  Edema: Trace  Mental Status:  Normal mood and affect. Normal behavior. Normal judgment and thought content.   Assessment and Plan:  Pregnancy: G1P0000 at 360w5d1. Supervision of normal first pregnancy, antepartum      Doing well  2. Gastroesophageal reflux during pregnancy, antepartum, third trimester      Taking Prilosec, no complaints today  3. Rubella non-immune status, antepartum     MMR postpartum  4.  Pregnancy headache in third trimester      - butalbital-acetaminophen-caffeine (FIORICET, ESGIC) 50-325-40 MG tablet; Take 1-2 tablets by mouth every 6 (six) hours as needed.  Dispense: 45 tablet; Refill: 1  Term labor symptoms and general obstetric precautions including but not limited to vaginal bleeding, contractions, leaking of fluid and fetal movement were reviewed in detail with the patient. Please refer to After Visit Summary for other counseling recommendations.  Return in about 1 week (around 01/17/2018) for ROJacobus  RaMorene CrockerCNM

## 2018-01-12 ENCOUNTER — Encounter (HOSPITAL_COMMUNITY): Payer: Self-pay

## 2018-01-12 ENCOUNTER — Encounter: Payer: Medicaid Other | Admitting: Certified Nurse Midwife

## 2018-01-12 ENCOUNTER — Inpatient Hospital Stay (HOSPITAL_COMMUNITY)
Admission: AD | Admit: 2018-01-12 | Discharge: 2018-01-12 | Disposition: A | Discharge status: Home / Self Care | Payer: Medicaid Other | Source: Ambulatory Visit | Attending: Obstetrics and Gynecology | Admitting: Obstetrics and Gynecology

## 2018-01-12 DIAGNOSIS — Z0371 Encounter for suspected problem with amniotic cavity and membrane ruled out: Secondary | ICD-10-CM | POA: Diagnosis not present

## 2018-01-12 DIAGNOSIS — Z3403 Encounter for supervision of normal first pregnancy, third trimester: Secondary | ICD-10-CM | POA: Diagnosis not present

## 2018-01-12 LAB — AMNISURE RUPTURE OF MEMBRANE (ROM) NOT AT ARMC: Amnisure ROM: NEGATIVE

## 2018-01-12 LAB — POCT FERN TEST: POCT FERN TEST: NEGATIVE

## 2018-01-12 NOTE — MAU Note (Signed)
PT states she thinks she SROM'd at 0045 tonight. Clear fluid. Denies bleeding. Lost mucous plug on Tuesday. +FM

## 2018-01-12 NOTE — Progress Notes (Signed)
Pt not SROM. Non-painful irregular contractions. Discharge instructions include signs of labor and SROM. Patient indicated understanding of all instructions.

## 2018-01-12 NOTE — MAU Provider Note (Signed)
S: [name] is a 22 y.o. G1P0000 at 5132w0d  who presents to MAU today complaining of leaking of fluid since 0045. She denies vaginal bleeding. She denies contractions. She reports normal fetal movement.    O: LMP 03/27/2017 (Approximate)  GENERAL: Well-developed, well-nourished female in no acute distress.  HEAD: Normocephalic, atraumatic.  CHEST: Normal effort of breathing, regular heart rate ABDOMEN: Soft, nontender, gravid  Cervical exam: deferred   Fetal Monitoring: Baseline: 145 Variability: moderate Accelerations: 15x15 Decelerations: none Contractions: UI  Results for orders placed or performed during the hospital encounter of 01/12/18 (from the past 24 hour(s))  Amnisure rupture of membrane (rom)not at [facility]     Status: None   Collection Time: 01/12/18  2:23 AM  Result Value Ref Range   Amnisure ROM NEGATIVE    Fern & amnisure negative   A: SIUP at 3332w0d  Membranes intact  P: Discharge home F/u with ob as scheduled Discussed reasons to return to MAU  Judeth HornLawrence, Diora Bellizzi, NP 01/12/2018 3:01 AM

## 2018-01-12 NOTE — Discharge Instructions (Signed)

## 2018-01-17 ENCOUNTER — Encounter: Payer: Self-pay | Admitting: Certified Nurse Midwife

## 2018-01-17 ENCOUNTER — Inpatient Hospital Stay (HOSPITAL_COMMUNITY)
Admission: AD | Admit: 2018-01-17 | Discharge: 2018-01-18 | Disposition: A | Discharge status: Home / Self Care | Payer: Medicaid Other | Source: Ambulatory Visit | Attending: Family Medicine | Admitting: Family Medicine

## 2018-01-17 ENCOUNTER — Telehealth (HOSPITAL_COMMUNITY): Payer: Self-pay | Admitting: *Deleted

## 2018-01-17 ENCOUNTER — Encounter (HOSPITAL_COMMUNITY): Payer: Self-pay | Admitting: *Deleted

## 2018-01-17 ENCOUNTER — Ambulatory Visit (INDEPENDENT_AMBULATORY_CARE_PROVIDER_SITE_OTHER): Payer: Medicaid Other | Admitting: Certified Nurse Midwife

## 2018-01-17 VITALS — BP 133/80 | HR 80 | Wt 190.0 lb

## 2018-01-17 DIAGNOSIS — O479 False labor, unspecified: Secondary | ICD-10-CM

## 2018-01-17 DIAGNOSIS — Z3400 Encounter for supervision of normal first pregnancy, unspecified trimester: Secondary | ICD-10-CM

## 2018-01-17 DIAGNOSIS — O9989 Other specified diseases and conditions complicating pregnancy, childbirth and the puerperium: Secondary | ICD-10-CM

## 2018-01-17 DIAGNOSIS — Z283 Underimmunization status: Secondary | ICD-10-CM

## 2018-01-17 DIAGNOSIS — Z3A39 39 weeks gestation of pregnancy: Secondary | ICD-10-CM

## 2018-01-17 DIAGNOSIS — O26893 Other specified pregnancy related conditions, third trimester: Secondary | ICD-10-CM

## 2018-01-17 DIAGNOSIS — Z2839 Other underimmunization status: Secondary | ICD-10-CM

## 2018-01-17 DIAGNOSIS — O09899 Supervision of other high risk pregnancies, unspecified trimester: Secondary | ICD-10-CM

## 2018-01-17 DIAGNOSIS — R87612 Low grade squamous intraepithelial lesion on cytologic smear of cervix (LGSIL): Secondary | ICD-10-CM

## 2018-01-17 NOTE — Progress Notes (Signed)
   PRENATAL VISIT NOTE  Subjective:  Andrea Jenkins is a 22 y.o. G1P0000 at 18w5dbeing seen today for ongoing prenatal care.  She is currently monitored for the following issues for this low-risk pregnancy and has LGSIL on Pap smear of cervix; Supervision of normal first pregnancy, antepartum; Rubella non-immune status, antepartum; Gastroesophageal reflux during pregnancy, antepartum, third trimester; and Blindness of right eye on their problem list.  Patient reports no bleeding, no contractions, no cramping and no leaking.  Contractions: Irregular. Vag. Bleeding: None.  Movement: Present. Denies leaking of fluid.   The following portions of the patient's history were reviewed and updated as appropriate: allergies, current medications, past family history, past medical history, past social history, past surgical history and problem list. Problem list updated.  Objective:   Vitals:   01/17/18 1143  BP: 133/80  Pulse: 80  Weight: 190 lb (86.2 kg)    Fetal Status: Fetal Heart Rate (bpm): 135; doppler Fundal Height: 38 cm Movement: Present  Presentation: Vertex  General:  Alert, oriented and cooperative. Patient is in no acute distress.  Skin: Skin is warm and dry. No rash noted.   Cardiovascular: Normal heart rate noted  Respiratory: Normal respiratory effort, no problems with respiration noted  Abdomen: Soft, gravid, appropriate for gestational age.  Pain/Pressure: Absent     Pelvic: Cervical exam performed Dilation: 1 Effacement (%): 50 Station: -3, medium, midline  Extremities: Normal range of motion.     Mental Status:  Normal mood and affect. Normal behavior. Normal judgment and thought content.   Assessment and Plan:  Pregnancy: G1P0000 at 332w5d1. Supervision of normal first pregnancy, antepartum     Doing well.    2. Rubella non-immune status, antepartum     MMR postpartum  3. LGSIL on Pap smear of cervix     Repeat pap smear postpartum  Term labor symptoms and  general obstetric precautions including but not limited to vaginal bleeding, contractions, leaking of fluid and fetal movement were reviewed in detail with the patient. Please refer to After Visit Summary for other counseling recommendations.  Return in about 1 week (around 01/24/2018) for ROB, NST.   RaMorene CrockerCNM

## 2018-01-17 NOTE — Telephone Encounter (Signed)
Preadmission screen  

## 2018-01-18 ENCOUNTER — Inpatient Hospital Stay (HOSPITAL_COMMUNITY): Payer: Medicaid Other | Admitting: Anesthesiology

## 2018-01-18 ENCOUNTER — Encounter (HOSPITAL_COMMUNITY): Payer: Self-pay

## 2018-01-18 ENCOUNTER — Inpatient Hospital Stay (HOSPITAL_COMMUNITY)
Admission: AD | Admit: 2018-01-18 | Discharge: 2018-01-20 | DRG: 807 | Disposition: A | Discharge status: Home / Self Care | Payer: Medicaid Other | Source: Ambulatory Visit | Attending: Family Medicine | Admitting: Family Medicine

## 2018-01-18 DIAGNOSIS — Z3483 Encounter for supervision of other normal pregnancy, third trimester: Secondary | ICD-10-CM | POA: Diagnosis present

## 2018-01-18 DIAGNOSIS — Z283 Underimmunization status: Secondary | ICD-10-CM

## 2018-01-18 DIAGNOSIS — O9962 Diseases of the digestive system complicating childbirth: Principal | ICD-10-CM | POA: Diagnosis present

## 2018-01-18 DIAGNOSIS — K219 Gastro-esophageal reflux disease without esophagitis: Secondary | ICD-10-CM | POA: Diagnosis present

## 2018-01-18 DIAGNOSIS — R87612 Low grade squamous intraepithelial lesion on cytologic smear of cervix (LGSIL): Secondary | ICD-10-CM | POA: Diagnosis present

## 2018-01-18 DIAGNOSIS — O9989 Other specified diseases and conditions complicating pregnancy, childbirth and the puerperium: Secondary | ICD-10-CM

## 2018-01-18 DIAGNOSIS — O09899 Supervision of other high risk pregnancies, unspecified trimester: Secondary | ICD-10-CM

## 2018-01-18 DIAGNOSIS — Z3A39 39 weeks gestation of pregnancy: Secondary | ICD-10-CM

## 2018-01-18 DIAGNOSIS — Z3400 Encounter for supervision of normal first pregnancy, unspecified trimester: Secondary | ICD-10-CM

## 2018-01-18 DIAGNOSIS — Z87891 Personal history of nicotine dependence: Secondary | ICD-10-CM | POA: Diagnosis not present

## 2018-01-18 LAB — ABO/RH: ABO/RH(D): A POS

## 2018-01-18 LAB — CBC
HEMATOCRIT: 33.5 % — AB (ref 36.0–46.0)
Hemoglobin: 11 g/dL — ABNORMAL LOW (ref 12.0–15.0)
MCH: 27.3 pg (ref 26.0–34.0)
MCHC: 32.8 g/dL (ref 30.0–36.0)
MCV: 83.1 fL (ref 78.0–100.0)
Platelets: 129 10*3/uL — ABNORMAL LOW (ref 150–400)
RBC: 4.03 MIL/uL (ref 3.87–5.11)
RDW: 15.3 % (ref 11.5–15.5)
WBC: 13.1 10*3/uL — ABNORMAL HIGH (ref 4.0–10.5)

## 2018-01-18 LAB — TYPE AND SCREEN
ABO/RH(D): A POS
Antibody Screen: NEGATIVE

## 2018-01-18 LAB — RPR: RPR Ser Ql: NONREACTIVE

## 2018-01-18 MED ORDER — COCONUT OIL OIL: 1.0000 "application " | TOPICAL_OIL | Status: DC | PRN

## 2018-01-18 MED ORDER — FENTANYL CITRATE (PF) 100 MCG/2ML IJ SOLN: 50.0000 ug | INTRAMUSCULAR | Status: DC | PRN

## 2018-01-18 MED ORDER — DIBUCAINE 1 % RE OINT: 1.0000 "application " | TOPICAL_OINTMENT | RECTAL | Status: DC | PRN

## 2018-01-18 MED ORDER — LACTATED RINGERS IV SOLN: 500.0000 mL | INTRAVENOUS | Status: DC | PRN

## 2018-01-18 MED ORDER — ZOLPIDEM TARTRATE 5 MG PO TABS: 5.0000 mg | ORAL_TABLET | Freq: Every evening | ORAL | Status: DC | PRN

## 2018-01-18 MED ORDER — OXYCODONE-ACETAMINOPHEN 5-325 MG PO TABS: 2.0000 | ORAL_TABLET | ORAL | Status: DC | PRN

## 2018-01-18 MED ORDER — HYDROCODONE-ACETAMINOPHEN 5-325 MG PO TABS
1.0000 | ORAL_TABLET | Freq: Once | ORAL | Status: AC
  Administered 2018-01-18: 1 via ORAL
  Filled 2018-01-18: qty 1

## 2018-01-18 MED ORDER — ONDANSETRON HCL 4 MG PO TABS: 4.0000 mg | ORAL_TABLET | ORAL | Status: DC | PRN

## 2018-01-18 MED ORDER — BENZOCAINE-MENTHOL 20-0.5 % EX AERO
1.0000 "application " | INHALATION_SPRAY | CUTANEOUS | Status: DC | PRN
  Administered 2018-01-19: 1 via TOPICAL
  Filled 2018-01-18: qty 56

## 2018-01-18 MED ORDER — TETANUS-DIPHTH-ACELL PERTUSSIS 5-2.5-18.5 LF-MCG/0.5 IM SUSP
0.5000 mL | Freq: Once | INTRAMUSCULAR | Status: AC
  Administered 2018-01-19: 0.5 mL via INTRAMUSCULAR
  Filled 2018-01-18: qty 0.5

## 2018-01-18 MED ORDER — SOD CITRATE-CITRIC ACID 500-334 MG/5ML PO SOLN: 30.0000 mL | ORAL | Status: DC | PRN

## 2018-01-18 MED ORDER — PRENATAL MULTIVITAMIN CH
1.0000 | ORAL_TABLET | Freq: Every day | ORAL | Status: DC
  Administered 2018-01-19 – 2018-01-20 (×2): 1 via ORAL
  Filled 2018-01-18 (×2): qty 1

## 2018-01-18 MED ORDER — FENTANYL CITRATE (PF) 100 MCG/2ML IJ SOLN
INTRAMUSCULAR | Status: AC
  Filled 2018-01-18: qty 2

## 2018-01-18 MED ORDER — ACETAMINOPHEN 325 MG PO TABS: 650.0000 mg | ORAL_TABLET | ORAL | Status: DC | PRN

## 2018-01-18 MED ORDER — WITCH HAZEL-GLYCERIN EX PADS: 1.0000 "application " | MEDICATED_PAD | CUTANEOUS | Status: DC | PRN

## 2018-01-18 MED ORDER — OXYTOCIN 40 UNITS IN LACTATED RINGERS INFUSION - SIMPLE MED
INTRAVENOUS | Status: AC
  Filled 2018-01-18: qty 1000

## 2018-01-18 MED ORDER — FENTANYL 2.5 MCG/ML BUPIVACAINE 1/10 % EPIDURAL INFUSION (WH - ANES): 14.0000 mL/h | INTRAMUSCULAR | Status: DC | PRN

## 2018-01-18 MED ORDER — EPHEDRINE 5 MG/ML INJ
10.0000 mg | INTRAVENOUS | Status: DC | PRN
  Filled 2018-01-18: qty 2

## 2018-01-18 MED ORDER — PHENYLEPHRINE 40 MCG/ML (10ML) SYRINGE FOR IV PUSH (FOR BLOOD PRESSURE SUPPORT)
80.0000 ug | PREFILLED_SYRINGE | INTRAVENOUS | Status: DC | PRN
  Filled 2018-01-18: qty 5

## 2018-01-18 MED ORDER — ONDANSETRON HCL 4 MG/2ML IJ SOLN: 4.0000 mg | INTRAMUSCULAR | Status: DC | PRN

## 2018-01-18 MED ORDER — OXYTOCIN BOLUS FROM INFUSION
500.0000 mL | Freq: Once | INTRAVENOUS | Status: AC
  Administered 2018-01-18: 500 mL via INTRAVENOUS

## 2018-01-18 MED ORDER — FLEET ENEMA 7-19 GM/118ML RE ENEM: 1.0000 | ENEMA | RECTAL | Status: DC | PRN

## 2018-01-18 MED ORDER — LIDOCAINE HCL (PF) 1 % IJ SOLN
INTRAMUSCULAR | Status: AC
  Filled 2018-01-18: qty 30

## 2018-01-18 MED ORDER — OXYTOCIN 40 UNITS IN LACTATED RINGERS INFUSION - SIMPLE MED: 2.5000 [IU]/h | INTRAVENOUS | Status: DC

## 2018-01-18 MED ORDER — OXYCODONE-ACETAMINOPHEN 5-325 MG PO TABS: 1.0000 | ORAL_TABLET | ORAL | Status: DC | PRN

## 2018-01-18 MED ORDER — LACTATED RINGERS IV SOLN
INTRAVENOUS | Status: DC
  Administered 2018-01-18: 09:00:00 via INTRAVENOUS

## 2018-01-18 MED ORDER — FENTANYL 2.5 MCG/ML BUPIVACAINE 1/10 % EPIDURAL INFUSION (WH - ANES)
14.0000 mL/h | INTRAMUSCULAR | Status: DC | PRN
  Administered 2018-01-18: 14 mL/h via EPIDURAL

## 2018-01-18 MED ORDER — DIPHENHYDRAMINE HCL 25 MG PO CAPS: 25.0000 mg | ORAL_CAPSULE | Freq: Four times a day (QID) | ORAL | Status: DC | PRN

## 2018-01-18 MED ORDER — SENNOSIDES-DOCUSATE SODIUM 8.6-50 MG PO TABS
2.0000 | ORAL_TABLET | ORAL | Status: DC
  Administered 2018-01-18 – 2018-01-19 (×2): 2 via ORAL
  Filled 2018-01-18 (×2): qty 2

## 2018-01-18 MED ORDER — DIPHENHYDRAMINE HCL 50 MG/ML IJ SOLN: 12.5000 mg | INTRAMUSCULAR | Status: DC | PRN

## 2018-01-18 MED ORDER — LIDOCAINE HCL (PF) 1 % IJ SOLN
30.0000 mL | INTRAMUSCULAR | Status: AC | PRN
  Administered 2018-01-18: 30 mL via SUBCUTANEOUS
  Filled 2018-01-18: qty 30

## 2018-01-18 MED ORDER — LIDOCAINE HCL (PF) 1 % IJ SOLN
INTRAMUSCULAR | Status: DC | PRN
  Administered 2018-01-18 (×2): 4 mL

## 2018-01-18 MED ORDER — LACTATED RINGERS IV SOLN
500.0000 mL | Freq: Once | INTRAVENOUS | Status: AC
  Administered 2018-01-18: 500 mL via INTRAVENOUS

## 2018-01-18 MED ORDER — FENTANYL 2.5 MCG/ML BUPIVACAINE 1/10 % EPIDURAL INFUSION (WH - ANES)
INTRAMUSCULAR | Status: AC
  Filled 2018-01-18: qty 100

## 2018-01-18 MED ORDER — PHENYLEPHRINE 40 MCG/ML (10ML) SYRINGE FOR IV PUSH (FOR BLOOD PRESSURE SUPPORT)
PREFILLED_SYRINGE | INTRAVENOUS | Status: AC
  Filled 2018-01-18: qty 20

## 2018-01-18 MED ORDER — IBUPROFEN 600 MG PO TABS
600.0000 mg | ORAL_TABLET | Freq: Four times a day (QID) | ORAL | Status: DC
  Administered 2018-01-18 – 2018-01-20 (×9): 600 mg via ORAL
  Filled 2018-01-18 (×9): qty 1

## 2018-01-18 MED ORDER — SIMETHICONE 80 MG PO CHEW: 80.0000 mg | CHEWABLE_TABLET | ORAL | Status: DC | PRN

## 2018-01-18 MED ORDER — ONDANSETRON HCL 4 MG/2ML IJ SOLN: 4.0000 mg | Freq: Four times a day (QID) | INTRAMUSCULAR | Status: DC | PRN

## 2018-01-18 MED ORDER — HYDROXYZINE HCL 50 MG PO TABS
50.0000 mg | ORAL_TABLET | Freq: Four times a day (QID) | ORAL | Status: DC | PRN
  Filled 2018-01-18: qty 1

## 2018-01-18 NOTE — H&P (Signed)
Andrea Jenkins is a 22 y.o. G1P0000 female at 4865w6d by 13wk u/s, presenting in active labor. She was here just a few hours ago and was 1.5cm and d/c'd home.    Reports active fetal movement, contractions: regular, vaginal bleeding: none, membranes: intact. [facility] at 11.5 wks.    This pregnancy complicated by: LGSIL pap, rubella non-imm, GERD, Blindness Rt eye after paintball incident  Prenatal History/Complications:  G1  Past Medical History: Past Medical History:  Diagnosis Date  . Asthma   . Gonorrhea   . Heart murmur    when younger  . Vaginal Pap smear, abnormal     Past Surgical History: Past Surgical History:  Procedure Laterality Date  . WISDOM TOOTH EXTRACTION    . WISDOM TOOTH EXTRACTION    . WRIST SURGERY      Obstetrical History: OB History    Gravida  1   Para  0   Term  0   Preterm  0   AB  0   Living  0     SAB  0   TAB  0   Ectopic  0   Multiple  0   Live Births              Social History: Social History   Socioeconomic History  . Marital status: Single    Spouse name: Not on file  . Number of children: Not on file  . Years of education: Not on file  . Highest education level: Not on file  Occupational History  . Not on file  Social Needs  . Financial resource strain: Not on file  . Food insecurity:    Worry: Not on file    Inability: Not on file  . Transportation needs:    Medical: Not on file    Non-medical: Not on file  Tobacco Use  . Smoking status: Former Smoker    Packs/day: 0.25    Types: Cigars    Last attempt to quit: 03/11/2017    Years since quitting: 0.8  . Smokeless tobacco: Never Used  Substance and Sexual Activity  . Alcohol use: No    Alcohol/week: 0.0 oz  . Drug use: No    Types: Marijuana  . Sexual activity: Yes    Partners: Male    Birth control/protection: None  Lifestyle  . Physical activity:    Days per week: Not on file    Minutes per session: Not on file   . Stress: Not on file  Relationships  . Social connections:    Talks on phone: Not on file    Gets together: Not on file    Attends religious service: Not on file    Active member of club or organization: Not on file    Attends meetings of clubs or organizations: Not on file    Relationship status: Not on file  Other Topics Concern  . Not on file  Social History Narrative  . Not on file    Family History: Family History  Problem Relation Age of Onset  . Hypertension Mother   . Miscarriages / [place] Mother   . Hypertension Maternal Grandmother   . Asthma Maternal Grandmother   . Diabetes Maternal Grandmother   . Vision loss Maternal Grandmother   . Mental illness Brother   . Cancer Maternal Aunt   . Heart disease Neg Hx     Allergies: Allergies  Allergen Reactions  . Peach [Prunus Persica] Hives  Medications Prior to Admission  Medication Sig Dispense Refill Last Dose  . butalbital-acetaminophen-caffeine (FIORICET, ESGIC) 50-325-40 MG tablet Take 1-2 tablets by mouth every 6 (six) hours as needed. 45 tablet 1 Past Week at Unknown time  . omeprazole (PRILOSEC) 20 MG capsule Take 1 capsule (20 mg total) by mouth 2 (two) times daily before a meal. 60 capsule 5 01/06/2018 at Unknown time  . Prenat-Fe Poly-Methfol-FA-DHA (VITAFOL ULTRA) 29-0.6-0.4-200 MG CAPS Take 1 tablet by mouth daily. 30 capsule 12 01/18/2018 at Unknown time    Review of Systems  Pertinent pos/neg as indicated in HPI  Blood pressure 140/82, temperature 98.6 F (37 C), temperature source Oral, resp. rate 18, last menstrual period 03/27/2017, SpO2 100 %. General appearance: alert, cooperative and mild distress Lungs: clear to auscultation bilaterally Heart: regular rate and rhythm Abdomen: gravid, soft, non-tender Extremities: trace edema DTR's 2+  Fetal monitoring: FHR: 140 bpm, variability: moderate,  Accelerations: Present,  decelerations:  Absent Uterine activity: q 2-52mins Dilation:  5 Effacement (%): 90 Station: -1 Exam by:: TLYTLE RN Presentation: cephalic   Prenatal labs: ABO, Rh: A/Positive/-- (09/10 1100) Antibody: Negative (09/10 1100) Rubella: <0.90 (09/10 1100) RPR: Non Reactive (12/28 1135)  HBsAg: Negative (09/10 1100)  HIV: Non Reactive (12/17 1043)  GBS: Negative (02/22 1108)   2hr GTT: 66/112/88 Genetic screening:  normal Anatomy US: normal female  No results found for this or any previous visit (from the past 24 hour(s)).   Assessment:  [redacted]w[redacted]d SIUP  G1P0000  Active labor  Cat 1 FHR  Abnormal pap during pregnancy  GBS Negative (02/22 1108)  Plan:  Admit to BS  IV pain meds/epidural prn active labor  Expectant management  Anticipate NSVB   Plans to breast & bottlefeed  Contraception: POPs  Circumcision: n/a  Needs repeat pap pp  Cheral Marker CNM, WHNP-BC 01/18/2018, 6:43 AM

## 2018-01-18 NOTE — Progress Notes (Signed)
LABOR PROGRESS NOTE  Andrea Jenkins is a 22 y.o. G1P0000 at 449w6d  admitted for active labor  Subjective: Patient doing well, now comfortable with epidural  Objective: BP 134/73   Pulse 84   Temp (!) 97.5 F (36.4 C) (Oral)   Resp 18   LMP 03/27/2017 (Approximate)   SpO2 100%  or  Vitals:   01/18/18 0902 01/18/18 0930 01/18/18 1000 01/18/18 1030  BP: 121/74 118/70 139/90 134/73  Pulse: 85 85 93 84  Resp: 16 16 18 18   Temp:  (!) 97.5 F (36.4 C)    TempSrc:  Oral    SpO2:        Dilation: 5.5 Effacement (%): 100 Station: 0 Presentation: Vertex Exam by:: A Showfety RN FHT: baseline rate 140, moderate varibility, +acel, no decel Toco: ctx 1-3 min  Assessment / Plan: 22 y.o. G1P0000 at 4349w6d here for SOL  Labor: progressing well, expectant mangement Fetal Wellbeing:  Cat I Pain Control:  epidural Anticipated MOD:  SVD  Frederik PearJulie P Charels Stambaugh, MD 01/18/2018, 10:58 AM

## 2018-01-18 NOTE — Anesthesia Procedure Notes (Signed)
Epidural Patient location during procedure: OB Start time: 01/18/2018 8:13 AM End time: 01/18/2018 8:22 AM  Staffing Anesthesiologist: Lewie LoronGermeroth, Brande Uncapher, MD Performed: anesthesiologist   Preanesthetic Checklist Completed: patient identified, pre-op evaluation, timeout performed, IV checked, risks and benefits discussed and monitors and equipment checked  Epidural Patient position: sitting Prep: site prepped and draped and DuraPrep Patient monitoring: heart rate, continuous pulse ox and blood pressure Approach: midline Location: L3-L4 Injection technique: LOR air and LOR saline  Needle:  Needle type: Tuohy  Needle gauge: 17 G Needle length: 9 cm Needle insertion depth: 8 cm Catheter type: closed end flexible Catheter size: 19 Gauge Catheter at skin depth: 13 cm Test dose: negative  Assessment Sensory level: T8 Events: blood not aspirated, injection not painful, no injection resistance, negative IV test and no paresthesia  Additional Notes Reason for block:procedure for pain

## 2018-01-18 NOTE — Anesthesia Pain Management Evaluation Note (Signed)
  CRNA Pain Management Visit Note  Patient: Andrea Jenkins, 22 y.o., female  "Hello I am a [facility] of the anesthesia team at [facility]. We have an anesthesia team available at all times to provide care throughout the hospital, including epidural management and anesthesia for C-section. I don't know your plan for the delivery whether it a natural birth, water birth, IV sedation, nitrous supplementation, doula or epidural, but we want to meet your pain goals."   1.Was your pain managed to your expectations on prior hospitalizations?   No prior hospitalizations  2.What is your expectation for pain management during this hospitalization?     IV pain meds  3.How can we help you reach that goal? Nursing interventions.  Record the patient's initial score and the patient's pain goal.   Pain: 9  Pain Goal: 5 [facility] wants you to be able to say your pain was always managed very well.  Andrea Jenkins 01/18/2018

## 2018-01-18 NOTE — Anesthesia Preprocedure Evaluation (Signed)
Anesthesia Evaluation  Patient identified by MRN, date of birth, ID band Patient awake    Reviewed: Allergy & Precautions, NPO status , Patient's Chart, lab work & pertinent test results  Airway Mallampati: II  TM Distance: >3 FB Neck ROM: Full    Dental no notable dental hx.    Pulmonary asthma , former smoker,    Pulmonary exam normal breath sounds clear to auscultation       Cardiovascular negative cardio ROS Normal cardiovascular exam Rhythm:Regular Rate:Normal     Neuro/Psych negative neurological ROS  negative psych ROS   GI/Hepatic Neg liver ROS, GERD  ,  Endo/Other  negative endocrine ROS  Renal/GU negative Renal ROS     Musculoskeletal negative musculoskeletal ROS (+)   Abdominal   Peds  Hematology negative hematology ROS (+)   Anesthesia Other Findings   Reproductive/Obstetrics (+) Pregnancy                             Anesthesia Physical Anesthesia Plan  ASA: II  Anesthesia Plan: Epidural   Post-op Pain Management:    Induction:   PONV Risk Score and Plan:   Airway Management Planned:   Additional Equipment:   Intra-op Plan:   Post-operative Plan:   Informed Consent: I have reviewed the patients History and Physical, chart, labs and discussed the procedure including the risks, benefits and alternatives for the proposed anesthesia with the patient or authorized representative who has indicated his/her understanding and acceptance.     Plan Discussed with:   Anesthesia Plan Comments:         Anesthesia Quick Evaluation

## 2018-01-18 NOTE — MAU Provider Note (Signed)
Kathryne Hitchonesha L Kinnick is a 22 y.o. 681P0000 female at 4935w6d  RN Labor check, not seen by provider SVE by RN: Dilation: 1.5 Effacement (%): 50 Station: -2 Exam by:: Roxan Hockeyraci Lytle RN  NST: FHR baseline 135 bpm, Variability: moderate, Accelerations:present, Decelerations:  Absent= Cat 1/Reactive Toco: irregular, every 2-6 minutes  D/C home  Cheral MarkerKimberly R Josaiah Muhammed CNM, [facility] 01/18/2018 2:41 AM

## 2018-01-18 NOTE — MAU Note (Signed)
Pt here with c/o contractions for the last 2.5 hours and some spotting. Was seen in the office today and had "membranes swept." Was 1 cm. Reports good fetal movement.

## 2018-01-18 NOTE — Discharge Instructions (Signed)
Braxton Hicks Contractions °Contractions of the uterus can occur throughout pregnancy, but they are not always a sign that you are in labor. You may have practice contractions called Braxton Hicks contractions. These false labor contractions are sometimes confused with true labor. °What are Braxton Hicks contractions? °Braxton Hicks contractions are tightening movements that occur in the muscles of the uterus before labor. Unlike true labor contractions, these contractions do not result in opening (dilation) and thinning of the cervix. Toward the end of pregnancy (32-34 weeks), Braxton Hicks contractions can happen more often and may become stronger. These contractions are sometimes difficult to tell apart from true labor because they can be very uncomfortable. You should not feel embarrassed if you go to the hospital with false labor. °Sometimes, the only way to tell if you are in true labor is for your health care provider to look for changes in the cervix. The health care provider will do a physical exam and may monitor your contractions. If you are not in true labor, the exam should show that your cervix is not dilating and your water has not broken. °If there are other health problems associated with your pregnancy, it is completely safe for you to be sent home with false labor. You may continue to have Braxton Hicks contractions until you go into true labor. °How to tell the difference between true labor and false labor °True labor °· Contractions last 30-70 seconds. °· Contractions become very regular. °· Discomfort is usually felt in the top of the uterus, and it spreads to the lower abdomen and low back. °· Contractions do not go away with walking. °· Contractions usually become more intense and increase in frequency. °· The cervix dilates and gets thinner. °False labor °· Contractions are usually shorter and not as strong as true labor contractions. °· Contractions are usually irregular. °· Contractions  are often felt in the front of the lower abdomen and in the groin. °· Contractions may go away when you walk around or change positions while lying down. °· Contractions get weaker and are shorter-lasting as time goes on. °· The cervix usually does not dilate or become thin. °Follow these instructions at home: °· Take over-the-counter and prescription medicines only as told by your health care provider. °· Keep up with your usual exercises and follow other instructions from your health care provider. °· Eat and drink lightly if you think you are going into labor. °· If Braxton Hicks contractions are making you uncomfortable: °? Change your position from lying down or resting to walking, or change from walking to resting. °? Sit and rest in a tub of warm water. °? Drink enough fluid to keep your urine pale yellow. Dehydration may cause these contractions. °? Do slow and deep breathing several times an hour. °· Keep all follow-up prenatal visits as told by your health care provider. This is important. °Contact a health care provider if: °· You have a fever. °· You have continuous pain in your abdomen. °Get help right away if: °· Your contractions become stronger, more regular, and closer together. °· You have fluid leaking or gushing from your vagina. °· You pass blood-tinged mucus (bloody show). °· You have bleeding from your vagina. °· You have low back pain that you never had before. °· You feel your baby’s head pushing down and causing pelvic pressure. °· Your baby is not moving inside you as much as it used to. °Summary °· Contractions that occur before labor are called Braxton   Hicks contractions, false labor, or practice contractions. °· Braxton Hicks contractions are usually shorter, weaker, farther apart, and less regular than true labor contractions. True labor contractions usually become progressively stronger and regular and they become more frequent. °· Manage discomfort from Braxton Hicks contractions by  changing position, resting in a warm bath, drinking plenty of water, or practicing deep breathing. °This information is not intended to replace advice given to you by your health care provider. Make sure you discuss any questions you have with your health care provider. °Document Released: 02/24/2017 Document Revised: 02/24/2017 Document Reviewed: 02/24/2017 °Elsevier Interactive Patient Education © 2018 [employer] Inc. ° °

## 2018-01-18 NOTE — MAU Note (Signed)
Pt here with c/o contractions that have gotten worse. No leaking or bleeding. Reports fetal movement

## 2018-01-18 NOTE — Progress Notes (Signed)
Patient ID: Asencion Gowdaonesha L Commons, female   DOB: 08/04/1996, 21 y.o.   MRN: [id number] Asencion Gowdaonesha L Vanetten is a 22 y.o. G1P0000 at 6554w6d admitted for active labor  Called to room for pt ant lip/BBOW/urge to push  Subjective: Uncomfortable w/ uc's  Objective: BP 140/82 (BP Location: Right Arm)   Temp 98.6 F (37 C) (Oral)   Resp 18   LMP 03/27/2017 (Approximate)   SpO2 100%  No intake/output data recorded.  FHT:  FHR: 140 bpm, variability: moderate,  accelerations:  Present,  decelerations:  Absent UC:   regular, every 2-4 minutes  SVE:   Dilation: 5 Effacement (%): 90 Station: 0 Exam by:: Joellyn HaffKim Booker   Pt does not want epidural, offered AROM- wants, AROM small amt clear fluid  Labs: Lab Results  Component Value Date   WBC 9.3 01/07/2018   HGB 10.2 (L) 01/07/2018   HCT 30.5 (L) 01/07/2018   MCV 83.1 01/07/2018   PLT 136 (L) 01/07/2018    Assessment / Plan: Spontaneous labor, progressing normally, now AROM'd  Labor: Progressing normally Fetal Wellbeing:  Category I Pain Control:  wants IV pain meds Pre-eclampsia: n/a I/D:  n/a Anticipated MOD:  NSVD  Cheral MarkerKimberly R Booker CNM, WHNP-BC 01/18/2018, 7:37 AM

## 2018-01-18 NOTE — MAU Note (Signed)
Pt returns to MAU with cntrx.  Has been contracting since last night. Left MAU at 0300 this morning. No LOF, bloody show and + FM.

## 2018-01-18 NOTE — Anesthesia Postprocedure Evaluation (Signed)
Anesthesia Post Note  Patient: Andrea Jenkins  Procedure(s) Performed: AN AD HOC LABOR EPIDURAL     Patient location during evaluation: Mother Baby Anesthesia Type: Epidural Level of consciousness: awake and alert and oriented Pain management: satisfactory to patient Vital Signs Assessment: post-procedure vital signs reviewed and stable Respiratory status: respiratory function stable Cardiovascular status: stable Postop Assessment: no headache, no backache, epidural receding, patient able to bend at knees, no signs of nausea or vomiting and adequate PO intake Anesthetic complications: no    Last Vitals:  Vitals:   01/18/18 1400 01/18/18 1415  BP: 127/88 126/82  Pulse: (!) 112 (!) 102  Resp: 16 16  Temp:    SpO2:      Last Pain:  Vitals:   01/18/18 1545  TempSrc:   PainSc: 0-No pain   Pain Goal:                 Nur Krasinski

## 2018-01-18 NOTE — MAU Note (Signed)
I have communicated with Joellyn HaffKim Booker, CNMand reviewed vital signs:  Vitals:   01/18/18 0029 01/18/18 0253  BP: 129/68 137/61  Pulse: 77   Resp: 18   Temp: 98.5 F (36.9 C)   SpO2: 100%     Vaginal exam:  Dilation: 1.5 Effacement (%): 50 Cervical Position: Middle Station: -2 Presentation: Vertex Exam by:: Roxan Hockeyraci Kimball Appleby RN ,   Also reviewed contraction pattern and that non-stress test is reactive.  It has been documented that patient is contracting every 2-5  minutes with minimal  cervical change over since office visit Tuesday  not indicating active labor.  Patient denies any other complaints.  Based on this report provider has given order for discharge.  A discharge order and diagnosis entered by a provider.   Labor discharge instructions reviewed with patient.

## 2018-01-19 NOTE — Lactation Note (Signed)
This note was copied from a baby's chart. Lactation Consultation Note  Patient Name: Andrea Jenkins Reason for consult: Follow-up assessment  Mom called requesting help with latching.  LC with another couplet, and Mom decided to give baby formula by bottle.  Mom states she wanted to breast and formula feed.   Encouraged Mom to rest, and then place baby STS on her chest.   Demonstrated beast massage and hand expression.  Mom surprised to see colostrum.   Mom to call for assistance with next feeding.   Interventions Interventions: Hand express;Breast massage;Hand pump  Lactation Tools Discussed/Used Pump Review: Setup, frequency, and cleaning;Milk Storage Initiated by:: Erby Pian [name] RN IBCLC Date initiated:: 01/19/18  [name], [name], 3:30 PM

## 2018-01-19 NOTE — Progress Notes (Signed)
POSTPARTUM PROGRESS NOTE  Post Partum Day 1 Subjective:  Asencion Gowdaonesha L Vokes is a 22 y.o. G1P1001 4632w6d s/p SVD.  No acute events overnight.  Pt denies problems with ambulating, voiding or po intake.  She denies nausea or vomiting.  Pain is well controlled. Lochia small.   Objective: Blood pressure (!) 117/59, pulse 74, temperature 99 F (37.2 C), temperature source Oral, resp. rate 18, last menstrual period 03/27/2017, SpO2 100 %, unknown if currently breastfeeding.  Physical Exam:  General: alert, cooperative and no distress Lochia:normal flow Chest: no respiratory distress Heart:regular rate, distal pulses intact Abdomen: soft, nontender,  Uterine Fundus: firm, appropriately tender DVT Evaluation: No calf swelling or tenderness Extremities: no edema  Recent Labs    01/18/18 0703  HGB 11.0*  HCT 33.5*    Assessment/Plan:  ASSESSMENT: Asencion Gowdaonesha L Nace is a 22 y.o. G1P1001 1032w6d s/p SVD.   Plan for discharge tomorrow, Breastfeeding and Contraception POPs   LOS: 1 day   Felicie MornHannah E Tonnie Friedel, Medical Student  01/19/2018, 6:24 AM

## 2018-01-19 NOTE — Lactation Note (Signed)
This note was copied from a baby's chart. Lactation Consultation Note  Patient Name: Girl Teofilo Podonesha Baba ZOXWR'UToday's Date: 01/19/2018    [facility]  Second attempt to visit this P1 mother whose infant is now 4323 hours old.  [facility] was with family at [facility]  LC put phone number on board and advised mother to call when infant is ready to feed.  LC will assess/observe the next feeding with mother's permission.  Mother with no immediate questions/concerns.     Caden Fukushima R Tonnya Garbett 01/19/2018, 11:41 AM

## 2018-01-20 MED ORDER — NORETHINDRONE 0.35 MG PO TABS: 1.0000 | ORAL_TABLET | Freq: Every day | ORAL | 11 refills | Status: DC

## 2018-01-20 MED ORDER — IBUPROFEN 600 MG PO TABS: 600.0000 mg | ORAL_TABLET | Freq: Four times a day (QID) | ORAL | 0 refills | Status: DC

## 2018-01-20 MED ORDER — MEASLES, MUMPS & RUBELLA VAC ~~LOC~~ INJ
0.5000 mL | INJECTION | Freq: Once | SUBCUTANEOUS | Status: AC
  Administered 2018-01-20: 0.5 mL via SUBCUTANEOUS
  Filled 2018-01-20: qty 0.5

## 2018-01-20 NOTE — Discharge Summary (Signed)
OB Discharge Summary     Patient Name: Andrea Jenkins DOB: 04-28-1996 MRN: [id number]  Date of admission: 01/18/2018 Delivering MD: Frederik Pear   Date of discharge: 01/20/2018  Admitting diagnosis: 39.5 WEEKS CTX Intrauterine pregnancy: [redacted]w[redacted]d     Secondary diagnosis:  Principal Problem:   SVD (spontaneous vaginal delivery) Active Problems:   LGSIL on Pap smear of cervix   Supervision of normal first pregnancy, antepartum   Rubella non-immune status, antepartum   Normal labor  Additional problems: none     Discharge diagnosis: Term Pregnancy Delivered                                                                                                Post partum procedures:  Augmentation: AROM  Complications: None  Hospital course:  Onset of Labor With Vaginal Delivery     22 y.o. yo G1P1001 at [redacted]w[redacted]d was admitted in Active Labor on 01/18/2018. Patient had an uncomplicated labor course as follows:  Membrane Rupture Time/Date: 7:33 AM ,01/18/2018   Intrapartum Procedures: Episiotomy: None [1]                                         Lacerations:  Periurethral [8]  Patient had a delivery of a Viable infant. 01/18/2018  Information for the patient's newborn:  Andrea, Jenkins Andrea Jenkins [[id number]]  Delivery Method: Vaginal, Spontaneous(Filed from Delivery Summary)    Pateint had an uncomplicated postpartum course.  She is ambulating, tolerating a regular diet, passing flatus, and urinating well. Patient is discharged home in stable condition on 01/20/18.   Physical exam  Vitals:   01/18/18 1940 01/19/18 0515 01/19/18 1652 01/20/18 0543  BP: (!) 118/52 (!) 117/59 (!) 125/56 (!) 123/56  Pulse: 77 74 80 64  Resp: 18 18 18 16   Temp: 99.3 F (37.4 C) 99 F (37.2 C) 98 F (36.7 C) 98.6 F (37 C)  TempSrc: Oral Oral Axillary Oral  SpO2: 100%      General: alert, cooperative and no distress Lochia: appropriate Uterine Fundus: firm Incision: N/A DVT Evaluation: No evidence of  DVT seen on physical exam. Negative Homan's sign. No cords or calf tenderness. Labs: Lab Results  Component Value Date   WBC 13.1 (H) 01/18/2018   HGB 11.0 (L) 01/18/2018   HCT 33.5 (L) 01/18/2018   MCV 83.1 01/18/2018   PLT 129 (L) 01/18/2018   CMP Latest Ref Rng & Units 01/07/2018  Glucose 65 - 99 mg/dL 76  BUN 6 - 20 mg/dL <7(W)  Creatinine 2.95 - 1.00 mg/dL 6.21  Sodium 308 - 657 mmol/L 133(L)  Potassium 3.5 - 5.1 mmol/L 3.6  Chloride 101 - 111 mmol/L 106  CO2 22 - 32 mmol/L 19(L)  Calcium 8.9 - 10.3 mg/dL 9.3  Total Protein 6.5 - 8.1 g/dL 6.9  Total Bilirubin 0.3 - 1.2 mg/dL 0.5  Alkaline Phos 38 - 126 U/L 139(H)  AST 15 - 41 U/L 20  ALT 14 - 54 U/L 12(L)  Discharge instruction: per After Visit Summary and "Baby and Me Booklet".  After visit meds:  Allergies as of 01/20/2018      Reactions   Peach [prunus Persica] Hives      Medication List    STOP taking these medications   butalbital-acetaminophen-caffeine 50-325-40 MG tablet Commonly known as:  FIORICET, ESGIC   omeprazole 20 MG capsule Commonly known as:  PRILOSEC   VITAFOL ULTRA 29-0.6-0.4-200 MG Caps     TAKE these medications   ibuprofen 600 MG tablet Commonly known as:  ADVIL,MOTRIN Take 1 tablet (600 mg total) by mouth every 6 (six) hours.   norethindrone 0.35 MG tablet Commonly known as:  ORTHO MICRONOR Take 1 tablet (0.35 mg total) by mouth daily.       Diet: routine diet  Activity: Advance as tolerated. Pelvic rest for 6 weeks.   Outpatient follow up: Follow up Appt: Future Appointments  Date Time Provider Department Center  02/15/2018  9:30 AM Orvilla Cornwallenney, Rachelle A, CNM [facility] None   Follow up Visit:No follow-ups on file.  Postpartum contraception: Progesterone only pills  Newborn Data: Live born female  Birth Weight: 7 lb 6.7 oz (3365 g) APGAR: 8, 9  Newborn Delivery   Birth date/time:  01/18/2018 12:15:00 Delivery type:  Vaginal, Spontaneous     Baby Feeding: Bottle  and Breast Disposition:home with mother   01/20/2018 Andrea Jenkins, CNM

## 2018-01-20 NOTE — Discharge Instructions (Signed)
Postpartum Care After Vaginal Delivery °The period of time right after you deliver your newborn is called the postpartum period. °What kind of medical care will I receive? °· You may continue to receive fluids and medicines through an IV tube inserted into one of your veins. °· If an incision was made near your vagina (episiotomy) or if you had some vaginal tearing during delivery, cold compresses may be placed on your episiotomy or your tear. This helps to reduce pain and swelling. °· You may be given a squirt bottle to use when you go to the bathroom. You may use this until you are comfortable wiping as usual. To use the squirt bottle, follow these steps: °? Before you urinate, fill the squirt bottle with warm water. Do not use hot water. °? After you urinate, while you are sitting on the toilet, use the squirt bottle to rinse the area around your urethra and vaginal opening. This rinses away any urine and blood. °? You may do this instead of wiping. As you start healing, you may use the squirt bottle before wiping yourself. Make sure to wipe gently. °? Fill the squirt bottle with clean water every time you use the bathroom. °· You will be given sanitary pads to wear. °How can I expect to feel? °· You may not feel the need to urinate for several hours after delivery. °· You will have some soreness and pain in your abdomen and vagina. °· If you are breastfeeding, you may have uterine contractions every time you breastfeed for up to several weeks postpartum. Uterine contractions help your uterus return to its normal size. °· It is normal to have vaginal bleeding (lochia) after delivery. The amount and appearance of lochia is often similar to a menstrual period in the first week after delivery. It will gradually decrease over the next few weeks to a dry, yellow-brown discharge. For most women, lochia stops completely by 6-8 weeks after delivery. Vaginal bleeding can vary from woman to woman. °· Within the first few  days after delivery, you may have breast engorgement. This is when your breasts feel heavy, full, and uncomfortable. Your breasts may also throb and feel hard, tightly stretched, warm, and tender. After this occurs, you may have milk leaking from your breasts. Your health care provider can help you relieve discomfort due to breast engorgement. Breast engorgement should go away within a few days. °· You may feel more sad or worried than normal due to hormonal changes after delivery. These feelings should not last more than a few days. If these feelings do not go away after several days, speak with your health care provider. °How should I care for myself? °· Tell your health care provider if you have pain or discomfort. °· Drink enough water to keep your urine clear or pale yellow. °· Wash your hands thoroughly with soap and water for at least 20 seconds after changing your sanitary pads, after using the toilet, and before holding or feeding your baby. °· If you are not breastfeeding, avoid touching your breasts a lot. Doing this can make your breasts produce more milk. °· If you become weak or lightheaded, or you feel like you might faint, ask for help before: °? Getting out of bed. °? Showering. °· Change your sanitary pads frequently. Watch for any changes in your flow, such as a sudden increase in volume, a change in color, the passing of large blood clots. If you pass a blood clot from your vagina, save it   to show to your health care provider. Do not flush blood clots down the toilet without having your health care provider look at them. °· Make sure that all your vaccinations are up to date. This can help protect you and your baby from getting certain diseases. You may need to have immunizations done before you leave the hospital. °· If desired, talk with your health care provider about methods of family planning or birth control (contraception). °How can I start bonding with my baby? °Spending as much time as  possible with your baby is very important. During this time, you and your baby can get to know each other and develop a bond. Having your baby stay with you in your room (rooming in) can give you time to get to know your baby. Rooming in can also help you become comfortable caring for your baby. Breastfeeding can also help you bond with your baby. °How can I plan for returning home with my baby? °· Make sure that you have a car seat installed in your vehicle. °? Your car seat should be checked by a certified car seat installer to make sure that it is installed safely. °? Make sure that your baby fits into the car seat safely. °· Ask your health care provider any questions you have about caring for yourself or your baby. Make sure that you are able to contact your health care provider with any questions after leaving the hospital. °This information is not intended to replace advice given to you by your health care provider. Make sure you discuss any questions you have with your health care provider. °Document Released: 08/08/2007 Document Revised: 03/15/2016 Document Reviewed: 09/15/2015 °Elsevier Interactive Patient Education © 2018 [employer] ° °

## 2018-01-23 ENCOUNTER — Encounter: Payer: Medicaid Other | Admitting: Certified Nurse Midwife

## 2018-01-26 ENCOUNTER — Inpatient Hospital Stay (HOSPITAL_COMMUNITY): Admission: RE | Admit: 2018-01-26 | Payer: Medicaid Other | Source: Ambulatory Visit

## 2018-02-15 ENCOUNTER — Other Ambulatory Visit (HOSPITAL_COMMUNITY)
Admission: RE | Admit: 2018-02-15 | Discharge: 2018-02-15 | Disposition: A | Discharge status: Home / Self Care | Payer: Medicaid Other | Source: Ambulatory Visit | Attending: Certified Nurse Midwife | Admitting: Certified Nurse Midwife

## 2018-02-15 ENCOUNTER — Encounter: Payer: Self-pay | Admitting: Certified Nurse Midwife

## 2018-02-15 ENCOUNTER — Ambulatory Visit (INDEPENDENT_AMBULATORY_CARE_PROVIDER_SITE_OTHER): Payer: Medicaid Other | Admitting: Certified Nurse Midwife

## 2018-02-15 ENCOUNTER — Encounter: Payer: Self-pay | Admitting: *Deleted

## 2018-02-15 NOTE — Progress Notes (Signed)
Patient states that she started taking OTC supplements yesterday to help increase her milk supply while breastfeeding. Advised of additional interventions that may help. Pt stated that she has also been constipated since delivery, and she is already taking BC pills.

## 2018-02-15 NOTE — Progress Notes (Signed)
..  Post Partum Exam  Andrea Jenkins is a 22 y.o. 911P1001 female who presents for a postpartum visit. She is 4 weeks postpartum following a spontaneous vaginal delivery. I have fully reviewed the prenatal and intrapartum course. The delivery was at 39.6 gestational weeks.  Anesthesia: epidural. Postpartum course has been good. Baby's course has been good. Baby is feeding by both breast and bottle - Carnation Good Start Soy and Gerber Gentle. Bleeding staining only. Bowel function is abnormal: constipated. Bladder function is normal. Patient is not sexually active. Contraception method is POP (progesterone). Postpartum depression screening:neg  The following portions of the patient's history were reviewed and updated as appropriate: allergies, current medications, past family history, past medical history, past social history, past surgical history and problem list. Last pap smear done 2017 and was Abnormal- LSIL  Review of Systems Pertinent items noted in HPI and remainder of comprehensive ROS otherwise negative.    Objective:  Blood pressure 117/72, pulse 78, height 5' 3.5" (1.613 m), weight 169 lb 3.2 oz (76.7 kg), last menstrual period 03/27/2017, currently breastfeeding.  General:  alert, cooperative and no distress   Breasts:  inspection negative, no nipple discharge or bleeding, no masses or nodularity palpable  Lungs: clear to auscultation bilaterally  Heart:  regular rate and rhythm, S1, S2 normal, no murmur, click, rub or gallop  Abdomen: soft, non-tender; bowel sounds normal; no masses,  no organomegaly   Vulva:  normal  Vagina: normal vagina, no discharge, exudate, lesion, or erythema  Cervix:  no cervical motion tenderness  Corpus: normal size, contour, position, consistency, mobility, non-tender  Adnexa:  normal adnexa  Rectal Exam: Not performed.        Assessment:    Normal 4 week postpartum exam.  Pap smear done at today's visit for hx of LSIL.   Plan:   1.  Contraception: oral progesterone-only contraceptive 2. Continue POP, breast feeding status: excellent.  Constipation: OTC medications discussed.  3. Follow up in: 1 year or as needed.

## 2018-02-17 LAB — CYTOLOGY - PAP
Adequacy: ABSENT
Diagnosis: NEGATIVE
HPV: NOT DETECTED

## 2018-02-20 ENCOUNTER — Other Ambulatory Visit: Payer: Self-pay | Admitting: Certified Nurse Midwife

## 2018-02-20 ENCOUNTER — Telehealth: Payer: Self-pay

## 2018-02-20 NOTE — Telephone Encounter (Signed)
Pt called c/o heavy bleeding soaking a pad in 1 hr and clots, Pt is on BCP's and Breast feeding Pt is concerned pleased advise.

## 2018-02-21 ENCOUNTER — Telehealth: Payer: Self-pay

## 2018-02-21 ENCOUNTER — Other Ambulatory Visit: Payer: Self-pay | Admitting: Certified Nurse Midwife

## 2018-02-21 NOTE — Telephone Encounter (Signed)
When did the bleeding start?   She is on Micronor a POP, I am not sure when she started taking it.

## 2018-02-21 NOTE — Telephone Encounter (Signed)
Pt advised with Birth control start her body will eventually adjust if bleeding worsens and pain or clots occur patient advised to [facility] to [facility] an [facility].

## 2018-02-21 NOTE — Telephone Encounter (Signed)
Bleeding started a day after post partum visit per pt  Started BCP's on Easter Sunday 02/12/18

## 2018-03-23 ENCOUNTER — Encounter (HOSPITAL_COMMUNITY): Payer: Self-pay | Admitting: Emergency Medicine

## 2018-03-23 ENCOUNTER — Emergency Department (HOSPITAL_COMMUNITY)
Admission: EM | Admit: 2018-03-23 | Discharge: 2018-03-24 | Disposition: A | Discharge status: Home / Self Care | Payer: Medicaid Other | Attending: Emergency Medicine | Admitting: Emergency Medicine

## 2018-03-23 DIAGNOSIS — Z5321 Procedure and treatment not carried out due to patient leaving prior to being seen by health care provider: Secondary | ICD-10-CM | POA: Diagnosis not present

## 2018-03-23 DIAGNOSIS — J029 Acute pharyngitis, unspecified: Secondary | ICD-10-CM | POA: Diagnosis not present

## 2018-03-23 NOTE — ED Triage Notes (Signed)
Pt reports sore throat x1 week. Reports feeling "hot," no documented fevers.

## 2018-03-24 LAB — GROUP A STREP BY PCR: GROUP A STREP BY PCR: NOT DETECTED

## 2018-03-24 NOTE — ED Notes (Signed)
Called in waiting room for vitals, no answer

## 2018-03-26 ENCOUNTER — Encounter (HOSPITAL_COMMUNITY): Payer: Self-pay | Admitting: *Deleted

## 2018-03-26 ENCOUNTER — Other Ambulatory Visit: Payer: Self-pay

## 2018-03-26 ENCOUNTER — Emergency Department (HOSPITAL_COMMUNITY)
Admission: EM | Admit: 2018-03-26 | Discharge: 2018-03-27 | Disposition: A | Discharge status: Home / Self Care | Payer: Medicaid Other | Attending: Emergency Medicine | Admitting: Emergency Medicine

## 2018-03-26 DIAGNOSIS — J45909 Unspecified asthma, uncomplicated: Secondary | ICD-10-CM | POA: Insufficient documentation

## 2018-03-26 DIAGNOSIS — J029 Acute pharyngitis, unspecified: Secondary | ICD-10-CM

## 2018-03-26 DIAGNOSIS — Z87891 Personal history of nicotine dependence: Secondary | ICD-10-CM | POA: Insufficient documentation

## 2018-03-26 DIAGNOSIS — R070 Pain in throat: Secondary | ICD-10-CM | POA: Diagnosis not present

## 2018-03-26 DIAGNOSIS — Z79899 Other long term (current) drug therapy: Secondary | ICD-10-CM | POA: Diagnosis not present

## 2018-03-26 NOTE — ED Notes (Signed)
Bed: WA08 Expected date:  Expected time:  Means of arrival:  Comments: 

## 2018-03-26 NOTE — ED Triage Notes (Signed)
Pt c/o sore throat x 3 weeks.  Pt stated "I went to [facility], they swabbed my throat but then I left."

## 2018-03-26 NOTE — ED Provider Notes (Signed)
[facility]-EMERGENCY DEPT Provider Note   CSN: [id number] Arrival date & time: 03/26/18  2116     History   Chief Complaint Chief Complaint  Patient presents with  . Sore Throat    x 3 weeks    HPI Asencion Gowdaonesha L Baus is a 22 y.o. female.  The history is provided by the patient and medical records.  Sore Throat     21 y.o. F with hx of asthma, presenting to the ED for sore throat.  States this has been ongoing for about 2 weeks total.  [facility] few [facility] and had a swab done but left prior to being seen.  States last week she was coughing and sneezing but that seems to have resolved at this time.  No fever/chills.  No chest pain, SOB.  States sister was sick with virus last week.  No other sick contacts.  Has tried OTC nasal spray, cough drops, honey, etc without relief.  Past Medical History:  Diagnosis Date  . Asthma   . Gonorrhea   . Heart murmur    when younger  . Vaginal Pap smear, abnormal     Patient Active Problem List   Diagnosis Date Noted  . 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 WISDOM TOOTH EXTRACTION    . WISDOM TOOTH EXTRACTION    . WRIST SURGERY       OB History    Gravida  1   Para  1   Term  1   Preterm  0   AB  0   Living  1     SAB  0   TAB  0   Ectopic  0   Multiple  0   Live Births  1            Home Medications    Prior to Admission medications   Medication Sig Start Date End Date Taking? Authorizing Provider  ibuprofen (ADVIL,MOTRIN) 600 MG tablet Take 1 tablet (600 mg total) by mouth every 6 (six) hours. Patient not taking: Reported on 02/15/2018 01/20/18   Cresenzo-Dishmon, Scarlette CalicoFrances, CNM  norethindrone (ORTHO MICRONOR) 0.35 MG tablet Take 1 tablet (0.35 mg total) by mouth daily. 01/20/18   Jacklyn Shellresenzo-Dishmon, Frances, CNM    Family History Family History  Problem Relation Age of Onset  .  Hypertension Mother   . Miscarriages / [place] Mother   . Hypertension Maternal Grandmother   . Asthma Maternal Grandmother   . Diabetes Maternal Grandmother   . Vision loss Maternal Grandmother   . Mental illness Brother   . Cancer Maternal Aunt   . Heart disease Neg Hx     Social History Social History   Tobacco Use  . Smoking status: Former Smoker    Packs/day: 0.25    Types: Cigars    Last attempt to quit: 03/11/2017    Years since quitting: 1.0  . Smokeless tobacco: Never Used  Substance Use Topics  . Alcohol use: No    Alcohol/week: 0.0 oz  . Drug use: No     Allergies   Peach [prunus persica]   Review of Systems Review of Systems  HENT: Positive for sore throat.   All other systems reviewed and are negative.    Physical Exam Updated Vital Signs BP 129/71 (BP Location: Left Arm)   Pulse 81   Temp 98.4  F (36.9 C) (Oral)   Ht 5\' 4"  (1.626 m)   Wt 80.6 kg (177 lb 12.8 oz)   LMP 02/17/2018 (Approximate)   SpO2 98%   BMI 30.52 kg/m   Physical Exam  Constitutional: She is oriented to person, place, and time. She appears well-developed and well-nourished.  HENT:  Head: Normocephalic and atraumatic.  Right Ear: Tympanic membrane and ear canal normal.  Left Ear: Tympanic membrane and ear canal normal.  Nose: Nose normal.  Mouth/Throat: Uvula is midline, oropharynx is clear and moist and mucous membranes are normal.  Tonsils overall normal in appearance bilaterally without exudate; mild erythema without exudates; uvula midline without evidence of peritonsillar abscess; handling secretions appropriately; no difficulty swallowing or speaking; normal phonation without stridor  Eyes: Pupils are equal, round, and reactive to light. Conjunctivae and EOM are normal.  Neck: Normal range of motion.  Cardiovascular: Normal rate, regular rhythm and normal heart sounds.  Pulmonary/Chest: Effort normal and breath sounds normal.  Abdominal: Soft. Bowel sounds are  normal.  Musculoskeletal: Normal range of motion.  Neurological: She is alert and oriented to person, place, and time.  Skin: Skin is warm and dry.  Psychiatric: She has a normal mood and affect.  Nursing note and vitals reviewed.    ED Treatments / Results  Labs (all labs ordered are listed, but only abnormal results are displayed) Labs Reviewed - No data to display  EKG None  Radiology No results found.  Procedures Procedures (including critical care time)  Medications Ordered in ED Medications - No data to display   Initial Impression / Assessment and Plan / ED Course  I have reviewed the triage vital signs and the nursing notes.  Pertinent labs & imaging results that were available during my care of the patient were reviewed by me and considered in my medical decision making (see chart for details).  22 year old female here with sore throat.  She is afebrile and nontoxic.  Had rapid strep done a few days ago at [facility], which was negative.  Here there is no tonsillar edema or exudates.  Does have some mild erythema.  No edema or airway compromise.  Handling secretions well.  Normal phonation without stridor.  She has no other systemic symptoms.  Feel mono less likely.  Given that she was having some associated coughing and sneezing, may be allergic component.  We will treat with Zyrtec and viscous lidocaine.  She will need to follow-up with PCP.  Discussed plan with patient, she acknowledged understanding and agreed with plan of care.  Return precautions given for new or worsening symptoms.  Final Clinical Impressions(s) / ED Diagnoses   Final diagnoses:  Sore throat    ED Discharge Orders        Ordered    cetirizine (ZYRTEC ALLERGY) 10 MG tablet  Daily     03/27/18 0003    lidocaine (XYLOCAINE) 2 % solution  Every 4 hours PRN     03/27/18 0003       Garlon Hatchet, PA-C 03/27/18 0025    Gilda Crease, MD 03/27/18 [phone]

## 2018-03-27 MED ORDER — CETIRIZINE HCL 10 MG PO TABS: 10.0000 mg | ORAL_TABLET | Freq: Every day | ORAL | 1 refills | Status: DC

## 2018-03-27 MED ORDER — LIDOCAINE VISCOUS HCL 2 % MT SOLN: 15.0000 mL | OROMUCOSAL | 0 refills | Status: DC | PRN

## 2018-03-27 NOTE — Discharge Instructions (Signed)
Take the prescribed medication as directed. Follow-up with your primary care doctor.  If you do not have one, can call the 800 number on paperwork for assistance in finding clinic that accepts your insurance. Return to the ED for new or worsening symptoms.

## 2018-05-04 DIAGNOSIS — H40013 Open angle with borderline findings, low risk, bilateral: Secondary | ICD-10-CM | POA: Diagnosis not present

## 2018-05-04 DIAGNOSIS — H25811 Combined forms of age-related cataract, right eye: Secondary | ICD-10-CM | POA: Diagnosis not present

## 2018-05-04 DIAGNOSIS — H2511 Age-related nuclear cataract, right eye: Secondary | ICD-10-CM | POA: Diagnosis not present

## 2018-05-17 ENCOUNTER — Encounter: Payer: Self-pay | Admitting: Obstetrics

## 2018-05-17 ENCOUNTER — Ambulatory Visit (INDEPENDENT_AMBULATORY_CARE_PROVIDER_SITE_OTHER): Payer: Medicaid Other | Admitting: Obstetrics

## 2018-05-17 ENCOUNTER — Other Ambulatory Visit (HOSPITAL_COMMUNITY)
Admission: RE | Admit: 2018-05-17 | Discharge: 2018-05-17 | Disposition: A | Discharge status: Home / Self Care | Payer: Medicaid Other | Source: Ambulatory Visit | Attending: Obstetrics | Admitting: Obstetrics

## 2018-05-17 VITALS — BP 114/81 | HR 72 | Ht 64.0 in | Wt 183.7 lb

## 2018-05-17 DIAGNOSIS — N3000 Acute cystitis without hematuria: Secondary | ICD-10-CM | POA: Diagnosis not present

## 2018-05-17 DIAGNOSIS — Z3200 Encounter for pregnancy test, result unknown: Secondary | ICD-10-CM

## 2018-05-17 DIAGNOSIS — N760 Acute vaginitis: Secondary | ICD-10-CM | POA: Diagnosis not present

## 2018-05-17 DIAGNOSIS — Z3202 Encounter for pregnancy test, result negative: Secondary | ICD-10-CM

## 2018-05-17 DIAGNOSIS — B9689 Other specified bacterial agents as the cause of diseases classified elsewhere: Secondary | ICD-10-CM | POA: Diagnosis not present

## 2018-05-17 DIAGNOSIS — N898 Other specified noninflammatory disorders of vagina: Secondary | ICD-10-CM | POA: Insufficient documentation

## 2018-05-17 LAB — POCT URINALYSIS DIPSTICK
BILIRUBIN UA: NEGATIVE
GLUCOSE UA: NEGATIVE
KETONES UA: NEGATIVE
Nitrite, UA: NEGATIVE
Protein, UA: NEGATIVE
Spec Grav, UA: 1.01 (ref 1.010–1.025)
Urobilinogen, UA: 0.2 E.U./dL
pH, UA: 6.5 (ref 5.0–8.0)

## 2018-05-17 LAB — POCT URINE PREGNANCY: Preg Test, Ur: NEGATIVE

## 2018-05-17 MED ORDER — METRONIDAZOLE 0.75 % VA GEL: 1.0000 | Freq: Every day | VAGINAL | 2 refills | Status: DC

## 2018-05-17 MED ORDER — CEFUROXIME AXETIL 500 MG PO TABS: 500.0000 mg | ORAL_TABLET | Freq: Two times a day (BID) | ORAL | 0 refills | Status: DC

## 2018-05-17 NOTE — Progress Notes (Signed)
Patient is in the office for GYN visit, reports pain with urination and vaginal discharge/odor. Pt expresses concerns with milk production while breastfeeding and possibly wanting to change BC pills.

## 2018-05-18 LAB — CERVICOVAGINAL ANCILLARY ONLY
Bacterial vaginitis: NEGATIVE
Candida vaginitis: NEGATIVE
Chlamydia: NEGATIVE
NEISSERIA GONORRHEA: NEGATIVE
TRICH (WINDOWPATH): NEGATIVE

## 2018-05-18 NOTE — Progress Notes (Signed)
Patient ID: Andrea Jenkins, female   DOB: 08/09/1996, 22 y.o.   MRN: [id number]  Chief Complaint  Patient presents with  . GYN    HPI Andrea Gowdaonesha L Merta is a 22 y.o. female.  Urinary burning and frequency.  Vaginal discharge with odor. HPI  Past Medical History:  Diagnosis Date  . Asthma   . Gonorrhea   . Heart murmur    when younger  . Vaginal Pap smear, abnormal     Past Surgical History:  Procedure Laterality Date  . WISDOM TOOTH EXTRACTION    . WISDOM TOOTH EXTRACTION    . WRIST SURGERY      Family History  Problem Relation Age of Onset  . Hypertension Mother   . Miscarriages / [place]   . Hypertension Maternal Grandmother   . Asthma Maternal Grandmother   . Diabetes Maternal Grandmother   . Vision loss Maternal Grandmother   . Mental illness Brother   . Cancer Maternal Aunt   . Heart disease Neg Hx     Social History Social History   Tobacco Use  . Smoking status: Former Smoker    Packs/day: 0.25    Types: Cigars    Last attempt to quit: 03/11/2017    Years since quitting: 1.1  . Smokeless tobacco: Never Used  Substance Use Topics  . Alcohol use: No    Alcohol/week: 0.0 oz  . Drug use: No    Allergies  Allergen Reactions  . Peach [Prunus Persica] Hives    Current Outpatient Medications  Medication Sig Dispense Refill  . norethindrone (ORTHO MICRONOR) 0.35 MG tablet Take 1 tablet (0.35 mg total) by mouth daily. 1 Package 11  . cefUROXime (CEFTIN) 500 MG tablet Take 1 tablet (500 mg total) by mouth 2 (two) times daily with a meal. 14 tablet 0  . cetirizine (ZYRTEC ALLERGY) 10 MG tablet Take 1 tablet (10 mg total) by mouth daily. (Patient not taking: Reported on 05/17/2018) 30 tablet 1  . ibuprofen (ADVIL,MOTRIN) 600 MG tablet Take 1 tablet (600 mg total) by mouth every 6 (six) hours. (Patient not taking: Reported on 02/15/2018) 30 tablet 0  . lidocaine (XYLOCAINE) 2 % solution Use as directed 15 mLs in the mouth or throat every 4 (four)  hours as needed for mouth pain. (Patient not taking: Reported on 05/17/2018) 150 mL 0  . metroNIDAZOLE (METROGEL VAGINAL) 0.75 % vaginal gel Place 1 Applicatorful vaginally at bedtime. 70 g 2   No current facility-administered medications for this visit.     Review of Systems Review of Systems Constitutional: negative for fatigue and weight loss Respiratory: negative for cough and wheezing Cardiovascular: negative for chest pain, fatigue and palpitations Gastrointestinal: negative for abdominal pain and change in bowel habits Genitourinary:POSITIVE for vaginal discharge with odor and urinary frequency and burning Integument/breast: negative for nipple discharge Musculoskeletal:negative for myalgias Neurological: negative for gait problems and tremors Behavioral/Psych: negative for abusive relationship, depression Endocrine: negative for temperature intolerance      Blood pressure 114/81, pulse 72, height 5\' 4"  (1.626 m), weight 183 lb 11.2 oz (83.3 kg), currently breastfeeding.  Physical Exam Physical Exam           General:  Alert and no distress Abdomen:  normal findings: no organomegaly, soft, non-tender and no hernia  Pelvis:  External genitalia: normal general appearance Urinary system: urethral meatus normal and bladder without fullness, nontender Vaginal: normal without tenderness, induration or masses Cervix: normal appearance Adnexa: normal bimanual exam Uterus: anteverted and  non-tender, normal size    50% of 15 min visit spent on counseling and coordination of care.   Data Reviewed Wet Prep Urinalysis  Assessment     1. Acute cystitis without hematuria Rx: - cefUROXime (CEFTIN) 500 MG tablet; Take 1 tablet (500 mg total) by mouth 2 (two) times daily with a meal.  Dispense: 14 tablet; Refill: 0  2. Vaginal discharge Rx: - Cervicovaginal ancillary only - POCT Urinalysis Dipstick - Urine Culture  3. BV (bacterial vaginosis) Rx: - metroNIDAZOLE (METROGEL  VAGINAL) 0.75 % vaginal gel; Place 1 Applicatorful vaginally at bedtime.  Dispense: 70 g; Refill: 2  4. Possible pregnancy Rx: - POCT urine pregnancy    Plan    Follow up for Annual in 5 months  Orders Placed This Encounter  Procedures  . Urine Culture  . POCT Urinalysis Dipstick  . POCT urine pregnancy   Meds ordered this encounter  Medications  . metroNIDAZOLE (METROGEL VAGINAL) 0.75 % vaginal gel    Sig: Place 1 Applicatorful vaginally at bedtime.    Dispense:  70 g    Refill:  2  . cefUROXime (CEFTIN) 500 MG tablet    Sig: Take 1 tablet (500 mg total) by mouth 2 (two) times daily with a meal.    Dispense:  14 tablet    Refill:  0    Brock Bad MD 05-17-2018

## 2018-05-19 ENCOUNTER — Other Ambulatory Visit: Payer: Self-pay | Admitting: Obstetrics

## 2018-05-19 LAB — URINE CULTURE

## 2018-05-31 DIAGNOSIS — H2189 Other specified disorders of iris and ciliary body: Secondary | ICD-10-CM | POA: Diagnosis not present

## 2018-05-31 DIAGNOSIS — H25811 Combined forms of age-related cataract, right eye: Secondary | ICD-10-CM | POA: Diagnosis not present

## 2018-05-31 DIAGNOSIS — H26131 Total traumatic cataract, right eye: Secondary | ICD-10-CM | POA: Diagnosis not present

## 2018-05-31 DIAGNOSIS — H21541 Posterior synechiae (iris), right eye: Secondary | ICD-10-CM | POA: Diagnosis not present

## 2018-06-15 ENCOUNTER — Other Ambulatory Visit: Payer: Self-pay

## 2018-06-15 ENCOUNTER — Emergency Department (HOSPITAL_COMMUNITY)
Admission: EM | Admit: 2018-06-15 | Discharge: 2018-06-15 | Discharge status: Against Medical Advice | Payer: Medicaid Other | Attending: Emergency Medicine | Admitting: Emergency Medicine

## 2018-06-15 ENCOUNTER — Encounter (HOSPITAL_COMMUNITY): Payer: Self-pay | Admitting: *Deleted

## 2018-06-15 ENCOUNTER — Encounter (HOSPITAL_COMMUNITY): Payer: Self-pay | Admitting: Emergency Medicine

## 2018-06-15 ENCOUNTER — Ambulatory Visit (HOSPITAL_COMMUNITY)
Admission: EM | Admit: 2018-06-15 | Discharge: 2018-06-15 | Disposition: A | Discharge status: Home / Self Care | Payer: Medicaid Other | Attending: Family Medicine | Admitting: Family Medicine

## 2018-06-15 DIAGNOSIS — R51 Headache: Secondary | ICD-10-CM | POA: Insufficient documentation

## 2018-06-15 DIAGNOSIS — Z5321 Procedure and treatment not carried out due to patient leaving prior to being seen by health care provider: Secondary | ICD-10-CM | POA: Insufficient documentation

## 2018-06-15 DIAGNOSIS — G44309 Post-traumatic headache, unspecified, not intractable: Secondary | ICD-10-CM | POA: Diagnosis not present

## 2018-06-15 DIAGNOSIS — R519 Headache, unspecified: Secondary | ICD-10-CM

## 2018-06-15 MED ORDER — IBUPROFEN 600 MG PO TABS: 600.0000 mg | ORAL_TABLET | Freq: Three times a day (TID) | ORAL | 0 refills | Status: DC | PRN
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
## 2018-06-15 NOTE — Discharge Instructions (Signed)
Follow up with your primary care doctor or here within 48-72 hours. Do your best to ensure adequate rest. If not allergic, take acetaminophen (Tylenol) every 4-6 hours as needed for discomfort. Often individuals will develop a headache associated with mild nausea in the days or hours after a head injury. This is called a concussion and may require further follow up. ? ?Please seek prompt medical care if: ?You have: ?A very bad (severe) headache that is not helped by medicine. ?Trouble walking or weakness in your arms and legs. ?Clear or bloody fluid coming from your nose or ears. ?Changes in your seeing (vision). ?Jerky movements that you cannot control (seizure). ?You throw up (vomit). ?Your symptoms get worse. ?You lose balance. ?Your speech is slurred. ?You pass out. ?You are sleepier and have trouble staying awake. ?The black centers of your eyes (pupils) change in size. ? ?These symptoms may be an emergency. Do not wait to see if the symptoms will go away. Get medical help right away. Call your local emergency services. Do not drive yourself to the hospital. ? ?

## 2018-06-18 ENCOUNTER — Other Ambulatory Visit: Payer: Self-pay

## 2018-06-18 ENCOUNTER — Emergency Department (HOSPITAL_COMMUNITY)
Admission: EM | Admit: 2018-06-18 | Discharge: 2018-06-18 | Disposition: A | Discharge status: Home / Self Care | Payer: Medicaid Other | Attending: Emergency Medicine | Admitting: Emergency Medicine

## 2018-06-18 ENCOUNTER — Encounter (HOSPITAL_COMMUNITY): Payer: Self-pay | Admitting: Emergency Medicine

## 2018-06-18 DIAGNOSIS — M542 Cervicalgia: Secondary | ICD-10-CM | POA: Insufficient documentation

## 2018-06-18 DIAGNOSIS — Y9210 Unspecified residential institution as the place of occurrence of the external cause: Secondary | ICD-10-CM | POA: Insufficient documentation

## 2018-06-18 DIAGNOSIS — R51 Headache: Secondary | ICD-10-CM | POA: Insufficient documentation

## 2018-06-18 DIAGNOSIS — Y939 Activity, unspecified: Secondary | ICD-10-CM | POA: Diagnosis not present

## 2018-06-18 DIAGNOSIS — Y998 Other external cause status: Secondary | ICD-10-CM | POA: Insufficient documentation

## 2018-06-18 DIAGNOSIS — Z79899 Other long term (current) drug therapy: Secondary | ICD-10-CM | POA: Insufficient documentation

## 2018-06-18 DIAGNOSIS — Z87891 Personal history of nicotine dependence: Secondary | ICD-10-CM | POA: Insufficient documentation

## 2018-06-18 DIAGNOSIS — M62838 Other muscle spasm: Secondary | ICD-10-CM | POA: Diagnosis not present

## 2018-06-18 DIAGNOSIS — J45909 Unspecified asthma, uncomplicated: Secondary | ICD-10-CM | POA: Diagnosis not present

## 2018-06-18 DIAGNOSIS — M62830 Muscle spasm of back: Secondary | ICD-10-CM | POA: Diagnosis not present

## 2018-06-18 MED ORDER — CYCLOBENZAPRINE HCL 10 MG PO TABS
10.0000 mg | ORAL_TABLET | Freq: Once | ORAL | Status: AC
  Administered 2018-06-18: 10 mg via ORAL
  Filled 2018-06-18: qty 1

## 2018-06-18 MED ORDER — CYCLOBENZAPRINE HCL 10 MG PO TABS: 10.0000 mg | ORAL_TABLET | Freq: Two times a day (BID) | ORAL | 0 refills | Status: DC | PRN
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
## 2018-06-18 NOTE — Discharge Instructions (Signed)
Do not drive while taking the muscle relaxer as it will make you sleepy. [facility] and for general health care. Continue the ibuprofen you got at the Urgent Care visit.
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
## 2018-07-05 DIAGNOSIS — H5213 Myopia, bilateral: Secondary | ICD-10-CM | POA: Diagnosis not present

## 2018-08-02 ENCOUNTER — Telehealth: Payer: Self-pay

## 2018-08-02 DIAGNOSIS — N898 Other specified noninflammatory disorders of vagina: Secondary | ICD-10-CM

## 2018-08-02 MED ORDER — METRONIDAZOLE 500 MG PO TABS: 500.0000 mg | ORAL_TABLET | Freq: Two times a day (BID) | ORAL | 0 refills | Status: DC

## 2018-08-02 NOTE — Telephone Encounter (Signed)
Pt called stating she is having a fishy odor and vaginal discharge for about a week now. I advised pt I would send her Flagyl PO BID for 7 days per protocol and Dr. Clearance Coots. Pt verbalized understanding.

## 2018-08-09 DIAGNOSIS — H524 Presbyopia: Secondary | ICD-10-CM | POA: Diagnosis not present

## 2018-09-26 ENCOUNTER — Encounter: Payer: Self-pay | Admitting: *Deleted

## 2018-09-27 ENCOUNTER — Inpatient Hospital Stay (HOSPITAL_COMMUNITY)
Admission: AD | Admit: 2018-09-27 | Discharge: 2018-09-27 | Disposition: A | Discharge status: Home / Self Care | Payer: Medicaid Other | Source: Ambulatory Visit | Attending: Obstetrics & Gynecology | Admitting: Obstetrics & Gynecology

## 2018-09-27 ENCOUNTER — Ambulatory Visit: Payer: Medicaid Other | Admitting: Obstetrics

## 2018-09-27 ENCOUNTER — Encounter (HOSPITAL_COMMUNITY): Payer: Self-pay

## 2018-09-27 DIAGNOSIS — N898 Other specified noninflammatory disorders of vagina: Secondary | ICD-10-CM | POA: Diagnosis present

## 2018-09-27 DIAGNOSIS — Z87891 Personal history of nicotine dependence: Secondary | ICD-10-CM | POA: Diagnosis not present

## 2018-09-27 DIAGNOSIS — B373 Candidiasis of vulva and vagina: Secondary | ICD-10-CM | POA: Insufficient documentation

## 2018-09-27 DIAGNOSIS — B3731 Acute candidiasis of vulva and vagina: Secondary | ICD-10-CM

## 2018-09-27 LAB — URINALYSIS, ROUTINE W REFLEX MICROSCOPIC
Bilirubin Urine: NEGATIVE
GLUCOSE, UA: NEGATIVE mg/dL
Hgb urine dipstick: NEGATIVE
KETONES UR: NEGATIVE mg/dL
Nitrite: NEGATIVE
PROTEIN: NEGATIVE mg/dL
Specific Gravity, Urine: 1.017 (ref 1.005–1.030)
pH: 7 (ref 5.0–8.0)

## 2018-09-27 LAB — WET PREP, GENITAL
Clue Cells Wet Prep HPF POC: NONE SEEN
Sperm: NONE SEEN
Trich, Wet Prep: NONE SEEN

## 2018-09-27 LAB — POCT PREGNANCY, URINE: Preg Test, Ur: NEGATIVE

## 2018-09-27 MED ORDER — FLUCONAZOLE 150 MG PO TABS: 150.0000 mg | ORAL_TABLET | ORAL | 0 refills | Status: AC

## 2018-09-27 MED ORDER — TERCONAZOLE 0.8 % VA CREA: 1.0000 | TOPICAL_CREAM | Freq: Every day | VAGINAL | 0 refills | Status: DC

## 2018-09-27 NOTE — MAU Provider Note (Signed)
Chief Complaint: Vaginal Discharge   First Provider Initiated Contact with Patient 09/27/18 2150     SUBJECTIVE HPI: Andrea Jenkins is a 22 y.o. non pregnant female who presents to M[facility] Admissions reporting vaginal discharge & irritation. Symptoms began 3 days ago. Reports thick yellow curd like discharge. Endorses burning & irritation. Has not treated symptoms. Denies abdominal pain, dysuria, or vaginal bleeding.  Is in monogamous relationship. Does not use condoms & denies concern for STI.   Past Medical History:  Diagnosis Date  . Asthma   . Gonorrhea   . Heart murmur    when younger  . Vaginal Pap smear, abnormal    OB History  Gravida Para Term Preterm AB Living  1 1 1  0 0 1  SAB TAB Ectopic Multiple Live Births  0 0 0 0 1    # Outcome Date GA Lbr Len/2nd Weight Sex Delivery Anes PTL Lv  1 Term 01/18/18 [redacted]w[redacted]d 07:57 / 00:18 3365 g F Vag-Spont EPI  LIV   Past Surgical History:  Procedure Laterality Date  . WISDOM TOOTH EXTRACTION    . WRIST SURGERY     Social History   Socioeconomic History  . Marital status: Single    Spouse name: Not on file  . Number of children: Not on file  . Years of education: Not on file  . Highest education level: Not on file  Occupational History  . Not on file  Social Needs  . Financial resource strain: Not on file  . Food insecurity:    Worry: Not on file    Inability: Not on file  . Transportation needs:    Medical: Not on file    Non-medical: Not on file  Tobacco Use  . Smoking status: Former Smoker    Packs/day: 0.25    Types: Cigars    Last attempt to quit: 03/11/2017    Years since quitting: 1.5  . Smokeless tobacco: Never Used  Substance and Sexual Activity  . Alcohol use: No    Alcohol/week: 0.0 standard drinks  . Drug use: No  . Sexual activity: Yes    Partners: Male    Birth control/protection: Pill  Lifestyle  . Physical activity:    Days per week: Not on file    Minutes per session: Not on file  .  Stress: Not on file  Relationships  . Social connections:    Talks on phone: Not on file    Gets together: Not on file    Attends religious service: Not on file    Active member of club or organization: Not on file    Attends meetings of clubs or organizations: Not on file    Relationship status: Not on file  . Intimate partner violence:    Fear of current or ex partner: Not on file    Emotionally abused: Not on file    Physically abused: Not on file    Forced sexual activity: Not on file  Other Topics Concern  . Not on file  Social History Narrative  . Not on file   Family History  Problem Relation Age of Onset  . Hypertension Mother   . Miscarriages / [place] Mother   . Hypertension Maternal Grandmother   . Asthma Maternal Grandmother   . Diabetes Maternal Grandmother   . Vision loss Maternal Grandmother   . Mental illness Brother   . Cancer Maternal Aunt   . Heart disease Neg Hx    No current facility-administered medications  on file prior to encounter.    Current Outpatient Medications on File Prior to Encounter  Medication Sig Dispense Refill  . norethindrone (ORTHO MICRONOR) 0.35 MG tablet Take 1 tablet (0.35 mg total) by mouth daily. 1 Package 11   Allergies  Allergen Reactions  . Peach [Prunus Persica] Hives    I have reviewed patient's Past Medical Hx, Surgical Hx, Family Hx, Social Hx, medications and allergies.   Review of Systems  Constitutional: Negative.   Gastrointestinal: Negative.   Genitourinary: Positive for vaginal discharge. Negative for dysuria and vaginal bleeding.    OBJECTIVE Patient Vitals for the past 24 hrs:  BP Temp Temp src Pulse Resp SpO2 Height Weight  09/27/18 2225 128/73 98 F (36.7 C) Oral 83 18 - - -  09/27/18 2139 123/87 98.2 F (36.8 C) Oral 89 18 100 % 5\' 4"  (1.626 m) 81.2 kg   Constitutional: Well-developed, well-nourished female in no acute distress.  Cardiovascular: normal rate & rhythm, no murmur Respiratory:  normal rate and effort. Lung sounds clear throughout GI: Abd soft, non-tender, Pos BS x 4. No guarding or rebound tenderness MS: Extremities nontender, no edema, normal ROM Neurologic: Alert and oriented x 4.  GU: no lesions.  SPECULUM EXAM: NEFG, moderate amount of clumpy white discharge  LAB RESULTS Results for orders placed or performed during the hospital encounter of 09/27/18 (from the past 24 hour(s))  Pregnancy, urine POC     Status: None   Collection Time: 09/27/18  9:28 PM  Result Value Ref Range   Preg Test, Ur NEGATIVE NEGATIVE  Urinalysis, Routine w reflex microscopic     Status: Abnormal   Collection Time: 09/27/18  9:34 PM  Result Value Ref Range   Color, Urine YELLOW YELLOW   APPearance HAZY (A) CLEAR   Specific Gravity, Urine 1.017 1.005 - 1.030   pH 7.0 5.0 - 8.0   Glucose, UA NEGATIVE NEGATIVE mg/dL   Hgb urine dipstick NEGATIVE NEGATIVE   Bilirubin Urine NEGATIVE NEGATIVE   Ketones, ur NEGATIVE NEGATIVE mg/dL   Protein, ur NEGATIVE NEGATIVE mg/dL   Nitrite NEGATIVE NEGATIVE   Leukocytes, UA LARGE (A) NEGATIVE   RBC / HPF 6-10 0 - 5 RBC/hpf   WBC, UA 11-20 0 - 5 WBC/hpf   Bacteria, UA RARE (A) NONE SEEN   Squamous Epithelial / LPF 0-5 0 - 5   Mucus PRESENT   Wet prep, genital     Status: Abnormal   Collection Time: 09/27/18 10:02 PM  Result Value Ref Range   Yeast Wet Prep HPF POC PRESENT (A) NONE SEEN   Trich, Wet Prep NONE SEEN NONE SEEN   Clue Cells Wet Prep HPF POC NONE SEEN NONE SEEN   WBC, Wet Prep HPF POC MANY (A) NONE SEEN   Sperm NONE SEEN     IMAGING No results found.  MAU COURSE Orders Placed This Encounter  Procedures  . Wet prep, genital  . Urinalysis, Routine w reflex microscopic  . Pregnancy, urine POC  . Discharge patient   Meds ordered this encounter  Medications  . fluconazole (DIFLUCAN) 150 MG tablet    Sig: Take 1 tablet (150 mg total) by mouth every 3 (three) days for 2 doses.    Dispense:  2 tablet    Refill:  0     Order Specific Question:   Supervising Provider    Answer:   Adam Phenix [[id number]]  . terconazole (TERAZOL 3) 0.8 % vaginal cream    Sig: Place  1 applicator vaginally at bedtime.    Dispense:  20 g    Refill:  0    Order Specific Question:   Supervising Provider    Answer:   Adam PhenixARNOLD, JAMES G [[id number]]    MDM UPT negative Wet prep & exam indicate vaginal yeast  ASSESSMENT 1. Vaginal yeast infection     PLAN Discharge home in stable condition. Rx diflucan & terazol GC/CT pending  Allergies as of 09/27/2018      Reactions   Peach [prunus Persica] Hives      Medication List    STOP taking these medications   cefUROXime 500 MG tablet Commonly known as:  CEFTIN   cetirizine 10 MG tablet Commonly known as:  ZYRTEC   cyclobenzaprine 10 MG tablet Commonly known as:  FLEXERIL   ibuprofen 600 MG tablet Commonly known as:  ADVIL,MOTRIN   lidocaine 2 % solution Commonly known as:  XYLOCAINE   metroNIDAZOLE 0.75 % vaginal gel Commonly known as:  METROGEL   metroNIDAZOLE 500 MG tablet Commonly known as:  FLAGYL     TAKE these medications   fluconazole 150 MG tablet Commonly known as:  DIFLUCAN Take 1 tablet (150 mg total) by mouth every 3 (three) days for 2 doses.   norethindrone 0.35 MG tablet Commonly known as:  MICRONOR,CAMILA,ERRIN Take 1 tablet (0.35 mg total) by mouth daily.   terconazole 0.8 % vaginal cream Commonly known as:  TERAZOL 3 Place 1 applicator vaginally at bedtime.        Judeth HornLawrence, Zelma Snead, NP 09/28/2018  1:04 AM

## 2018-09-27 NOTE — MAU Note (Signed)
D/C instructions given. Pt verbalized understanding

## 2018-09-27 NOTE — Discharge Instructions (Signed)

## 2018-09-27 NOTE — MAU Note (Signed)
Pt here with c/o vaginal discharge and itching. LMP November 6

## 2018-09-28 ENCOUNTER — Ambulatory Visit: Payer: Medicaid Other

## 2018-09-28 LAB — GC/CHLAMYDIA PROBE AMP (~~LOC~~) NOT AT ARMC
Chlamydia: NEGATIVE
Neisseria Gonorrhea: NEGATIVE

## 2018-09-29 ENCOUNTER — Ambulatory Visit: Payer: Medicaid Other

## 2018-10-02 ENCOUNTER — Ambulatory Visit: Payer: Medicaid Other | Admitting: Obstetrics

## 2018-10-04 ENCOUNTER — Encounter (HOSPITAL_COMMUNITY): Payer: Self-pay | Admitting: *Deleted

## 2018-10-04 ENCOUNTER — Inpatient Hospital Stay (HOSPITAL_COMMUNITY)
Admission: AD | Admit: 2018-10-04 | Discharge: 2018-10-04 | Disposition: A | Discharge status: Home / Self Care | Payer: Medicaid Other | Source: Ambulatory Visit | Attending: Family Medicine | Admitting: Family Medicine

## 2018-10-04 DIAGNOSIS — B9689 Other specified bacterial agents as the cause of diseases classified elsewhere: Secondary | ICD-10-CM | POA: Diagnosis not present

## 2018-10-04 DIAGNOSIS — Z87891 Personal history of nicotine dependence: Secondary | ICD-10-CM | POA: Diagnosis not present

## 2018-10-04 DIAGNOSIS — N760 Acute vaginitis: Secondary | ICD-10-CM

## 2018-10-04 DIAGNOSIS — N898 Other specified noninflammatory disorders of vagina: Secondary | ICD-10-CM | POA: Diagnosis present

## 2018-10-04 LAB — WET PREP, GENITAL
SPERM: NONE SEEN
Trich, Wet Prep: NONE SEEN
YEAST WET PREP: NONE SEEN

## 2018-10-04 LAB — GC/CHLAMYDIA PROBE AMP (~~LOC~~) NOT AT ARMC
Chlamydia: NEGATIVE
Neisseria Gonorrhea: NEGATIVE

## 2018-10-04 MED ORDER — METRONIDAZOLE 500 MG PO TABS: 500.0000 mg | ORAL_TABLET | Freq: Two times a day (BID) | ORAL | 0 refills | Status: DC

## 2018-10-04 NOTE — MAU Note (Addendum)
PT SAYS LAST WEEK SHE CAME HERE- FOR VAG  IRRITATION- GAVE MEDS.  STILL  HAS D/C  AND FISHY SMELL.  LAST SEX-  BEFORE  SHE CAME HERE.

## 2018-10-04 NOTE — Discharge Instructions (Signed)

## 2018-10-04 NOTE — MAU Provider Note (Signed)
History     CSN: [id number]  Arrival date and time: 10/04/18 [id number]   First Provider Initiated Contact with Patient 10/04/18 [phone]      Chief Complaint  Patient presents with  . Vaginal Discharge   Vaginal Discharge  The patient's primary symptoms include vaginal discharge. The patient's pertinent negatives include no pelvic pain. This is a new problem. The current episode started in the past 7 days. The problem occurs constantly. The problem has been unchanged. The patient is experiencing no pain. She is not pregnant. Pertinent negatives include no chills, dysuria, fever, frequency, nausea or vomiting. The vaginal discharge was malodorous and white. There has been no bleeding. Nothing aggravates the symptoms. She has tried nothing for the symptoms.    OB History    Gravida  1   Para  1   Term  1   Preterm  0   AB  0   Living  1     SAB  0   TAB  0   Ectopic  0   Multiple  0   Live Births  1           Past Medical History:  Diagnosis Date  . Asthma   . Gonorrhea   . Heart murmur    when younger  . Vaginal Pap smear, abnormal     Past Surgical History:  Procedure Laterality Date  . WISDOM TOOTH EXTRACTION    . WRIST SURGERY      Family History  Problem Relation Age of Onset  . Hypertension Mother   . Miscarriages / [place] Mother   . Hypertension Maternal Grandmother   . Asthma Maternal Grandmother   . Diabetes Maternal Grandmother   . Vision loss Maternal Grandmother   . Mental illness Brother   . Cancer Maternal Aunt   . Heart disease Neg Hx     Social History   Tobacco Use  . Smoking status: Former Smoker    Packs/day: 0.25    Types: Cigars    Last attempt to quit: 03/11/2017    Years since quitting: 1.5  . Smokeless tobacco: Never Used  Substance Use Topics  . Alcohol use: No    Alcohol/week: 0.0 standard drinks  . Drug use: No    Allergies:  Allergies  Allergen Reactions  . Peach [Prunus Persica] Hives    Medications  Prior to Admission  Medication Sig Dispense Refill Last Dose  . terconazole (TERAZOL 3) 0.8 % vaginal cream Place 1 applicator vaginally at bedtime. 20 g 0 Past Week at Unknown time  . norethindrone (ORTHO MICRONOR) 0.35 MG tablet Take 1 tablet (0.35 mg total) by mouth daily. 1 Package 11 More than a month at Unknown time    Review of Systems  Constitutional: Negative for chills and fever.  Gastrointestinal: Negative for nausea and vomiting.  Genitourinary: Positive for vaginal discharge. Negative for dysuria, frequency, pelvic pain and vaginal bleeding.   Physical Exam   Blood pressure 120/67, pulse 71, temperature 98 F (36.7 C), temperature source Oral, resp. rate 18, height 5\' 4"  (1.626 m), weight 83.1 kg, currently breastfeeding.  Physical Exam  Nursing note and vitals reviewed. Constitutional: She is oriented to person, place, and time. She appears well-developed and well-nourished. No distress.  HENT:  Head: Normocephalic.  Cardiovascular: Normal rate.  Respiratory: Effort normal.  GI: Soft. There is no tenderness. There is no rebound.  Neurological: She is alert and oriented to person, place, and time.  Skin: Skin is warm  and dry.  Psychiatric: She has a normal mood and affect.     Results for orders placed or performed during the hospital encounter of 10/04/18 (from the past 24 hour(s))  Wet prep, genital     Status: Abnormal   Collection Time: 10/04/18  5:55 AM  Result Value Ref Range   Yeast Wet Prep HPF POC NONE SEEN NONE SEEN   Trich, Wet Prep NONE SEEN NONE SEEN   Clue Cells Wet Prep HPF POC PRESENT (A) NONE SEEN   WBC, Wet Prep HPF POC MODERATE (A) NONE SEEN   Sperm NONE SEEN      MAU Course  Procedures  MDM   Assessment and Plan   1. Bacterial vaginosis    DC home Comfort measures reviewed  RX: flagyl 500mg  BID x 7 days  Return to MAU as needed   Follow-up Information    Department, [facility] Follow up.   Contact  information: [address] [place] [address] [phone]            Thressa Sheller 10/04/2018, 6:35 AM

## 2018-10-19 ENCOUNTER — Other Ambulatory Visit: Payer: Self-pay

## 2018-10-19 ENCOUNTER — Inpatient Hospital Stay (HOSPITAL_COMMUNITY)
Admission: AD | Admit: 2018-10-19 | Discharge: 2018-10-19 | Discharge status: Against Medical Advice | Payer: Medicaid Other | Source: Ambulatory Visit | Attending: Obstetrics & Gynecology | Admitting: Obstetrics & Gynecology

## 2018-10-19 DIAGNOSIS — Z5329 Procedure and treatment not carried out because of patient's decision for other reasons: Secondary | ICD-10-CM | POA: Diagnosis not present

## 2018-10-19 DIAGNOSIS — R200 Anesthesia of skin: Secondary | ICD-10-CM | POA: Diagnosis not present

## 2018-10-19 LAB — URINALYSIS, ROUTINE W REFLEX MICROSCOPIC
Bilirubin Urine: NEGATIVE
Glucose, UA: NEGATIVE mg/dL
Ketones, ur: NEGATIVE mg/dL
Leukocytes, UA: NEGATIVE
Nitrite: NEGATIVE
Protein, ur: NEGATIVE mg/dL
RBC / HPF: >50 RBC/hpf — ABNORMAL HIGH (ref 0–5)
Specific Gravity, Urine: 1.018 (ref 1.005–1.030)
pH: 6 (ref 5.0–8.0)

## 2018-10-19 LAB — POCT PREGNANCY, URINE: PREG TEST UR: NEGATIVE

## 2018-10-19 NOTE — MAU Note (Signed)
Pt presents to MAU with complaints of of numbness and shooting pain in her right arm for 3 days

## 2018-11-04 ENCOUNTER — Inpatient Hospital Stay (HOSPITAL_COMMUNITY)
Admission: AD | Admit: 2018-11-04 | Discharge: 2018-11-04 | Disposition: A | Discharge status: Home / Self Care | Payer: Medicaid Other | Source: Ambulatory Visit | Attending: Obstetrics & Gynecology | Admitting: Obstetrics & Gynecology

## 2018-11-04 DIAGNOSIS — N760 Acute vaginitis: Secondary | ICD-10-CM

## 2018-11-04 DIAGNOSIS — N898 Other specified noninflammatory disorders of vagina: Secondary | ICD-10-CM | POA: Diagnosis present

## 2018-11-04 DIAGNOSIS — Z87891 Personal history of nicotine dependence: Secondary | ICD-10-CM | POA: Insufficient documentation

## 2018-11-04 DIAGNOSIS — B9689 Other specified bacterial agents as the cause of diseases classified elsewhere: Secondary | ICD-10-CM | POA: Diagnosis not present

## 2018-11-04 LAB — WET PREP, GENITAL
Sperm: NONE SEEN
Trich, Wet Prep: NONE SEEN
Yeast Wet Prep HPF POC: NONE SEEN

## 2018-11-04 LAB — POCT PREGNANCY, URINE: Preg Test, Ur: NEGATIVE

## 2018-11-04 MED ORDER — TERCONAZOLE 0.4 % VA CREA: 1.0000 | TOPICAL_CREAM | Freq: Every day | VAGINAL | 0 refills | Status: DC

## 2018-11-04 MED ORDER — FLUCONAZOLE 150 MG PO TABS: ORAL_TABLET | ORAL | 0 refills | Status: DC

## 2018-11-04 NOTE — MAU Note (Signed)
Pt did self swab for wet prep and then back to lobby to wait for results. Andrea Jenkins CNM discuss poc with pt and pt agreed.

## 2018-11-04 NOTE — MAU Note (Addendum)
For wk and half having vag odor. THen started having white vag d/c. Smells fishy. Was treated for BV in Dec and states took all medicine. Some vag itching

## 2018-11-04 NOTE — MAU Note (Addendum)
Paulina Fusi CNM in Triage to discuss test results and d/c plan with pt. Written and verbal d/c instructions given by CNM and pt d/c home from Triage

## 2018-11-04 NOTE — Discharge Instructions (Signed)
-  take metronidazole for 7 days starting today -on day 6, if still feeling symptoms of yeast, you can take fluconazole 1 pill plus start taking the cream. Take the cream for 7 days. Then, take another pill 3 days later.   The treatment for BV is metronidazole. We are going to treat your BV first, because metronidazole can cause a yeast infection.  I don't want you to take the yeast infection cream and pills now because you may develop a yeast infection later, at the end of your metronidazole treeamtn. Therefore, you should start your yeast infection treatment until Day 6 of your BV treatment.   -Come to [facility] : [facility] clinic downstairs Mon-Thurs from 5 to 7 pm.

## 2018-11-04 NOTE — MAU Provider Note (Signed)
Patient  Andrea Jenkins is a 23 y.o.  [id number] Non-pregnant female here for self-swab and with concerns about her on-going yeast infection vs. BV. She states that she has had BV and yeast since 7th grade, and she is frustrated. Was seen twice in MAU in December for similar complaints. She was given Diflucan, terazol in December 4th, which helped. She was then given flagyl on 12-11, which she did not finish.    Patient seen and treated in Triage Room.  History     CSN: [id number]  Arrival date and time: 11/04/18 0505   None     Chief Complaint  Patient presents with  . Vaginal Discharge   HPI Patient has on-going fishy smell to her discharge; states that her discharge is milky white and liquidy; not clumpy or yellow. She denies dysuria, lower abdominal pain or other ob-gyn complaints. Spec and pelvic not done.    OB History    Gravida  1   Para  1   Term  1   Preterm  0   AB  0   Living  1     SAB  0   TAB  0   Ectopic  0   Multiple  0   Live Births  1           Past Medical History:  Diagnosis Date  . Asthma   . Gonorrhea   . Heart murmur    when younger  . Vaginal Pap smear, abnormal     Past Surgical History:  Procedure Laterality Date  . WISDOM TOOTH EXTRACTION    . WRIST SURGERY      Family History  Problem Relation Age of Onset  . Hypertension Mother   . Miscarriages / [place] Mother   . Hypertension Maternal Grandmother   . Asthma Maternal Grandmother   . Diabetes Maternal Grandmother   . Vision loss Maternal Grandmother   . Mental illness Brother   . Cancer Maternal Aunt   . Heart disease Neg Hx     Social History   Tobacco Use  . Smoking status: Former Smoker    Packs/day: 0.25    Types: Cigars    Last attempt to quit: 03/11/2017    Years since quitting: 1.6  . Smokeless tobacco: Never Used  Substance Use Topics  . Alcohol use: No    Alcohol/week: 0.0 standard drinks  . Drug use: No    Allergies:  Allergies   Allergen Reactions  . Peach [Prunus Persica] Hives    No medications prior to admission.    Review of Systems  Constitutional: Negative.   HENT: Negative.   Respiratory: Negative.   Gastrointestinal: Negative.   Genitourinary: Positive for vaginal discharge.   Physical Exam   Blood pressure 118/60, pulse 78, temperature 98.2 F (36.8 C), resp. rate 16, height 5\' 4"  (1.626 m), weight 82.1 kg, last menstrual period 10/18/2018, currently breastfeeding.  Physical Exam  Constitutional: She is oriented to person, place, and time. She appears well-developed.  HENT:  Head: Normocephalic.  Neck: Normal range of motion.  Respiratory: Effort normal.  Musculoskeletal: Normal range of motion.  Neurological: She is alert and oriented to person, place, and time.  Skin: Skin is warm and dry.    MAU Course  Procedures  MDM -wet prep show clue cells, not yeast  Assessment and Plan   1. Bacterial vaginosis    2. Patient stable for discharge with RX for flagyl, diflucan and terazole.   3.  Reviewed in detail how to take her medicine. She should start with flagyl and then if she still has symptoms of yeast, she can try diflucan and terazol. Explained that taking all her medicines at once may make her problem worse, and that she needs to have a regular gyn appt to help her manage this problem as she may need additional cultures or boric acid/vitamin C suppositories to regular her pH. Encouraged her to come to walk-in clinic at [facility].   4. Discussed with patient the fact that MAU is for emergency and that we strongly urge her to seek outpatient care for complaints like this, especially once the [facility] hospital move occurs.   5. Patient verbalized understanding; stable for discharge.  Charlesetta Garibaldi Kooistra 11/04/2018, 8:48 AM

## 2018-11-08 ENCOUNTER — Ambulatory Visit: Payer: Medicaid Other | Admitting: Obstetrics

## 2018-12-05 DIAGNOSIS — Z13220 Encounter for screening for lipoid disorders: Secondary | ICD-10-CM | POA: Diagnosis not present

## 2018-12-05 DIAGNOSIS — Z0000 Encounter for general adult medical examination without abnormal findings: Secondary | ICD-10-CM | POA: Diagnosis not present

## 2018-12-05 DIAGNOSIS — R5383 Other fatigue: Secondary | ICD-10-CM | POA: Diagnosis not present

## 2018-12-05 DIAGNOSIS — Z7251 High risk heterosexual behavior: Secondary | ICD-10-CM | POA: Diagnosis not present

## 2018-12-05 DIAGNOSIS — Z131 Encounter for screening for diabetes mellitus: Secondary | ICD-10-CM | POA: Diagnosis not present

## 2018-12-05 DIAGNOSIS — Z114 Encounter for screening for human immunodeficiency virus [HIV]: Secondary | ICD-10-CM | POA: Diagnosis not present

## 2018-12-10 DIAGNOSIS — B009 Herpesviral infection, unspecified: Secondary | ICD-10-CM | POA: Diagnosis not present

## 2018-12-10 DIAGNOSIS — R21 Rash and other nonspecific skin eruption: Secondary | ICD-10-CM | POA: Diagnosis not present

## 2018-12-10 DIAGNOSIS — L503 Dermatographic urticaria: Secondary | ICD-10-CM | POA: Diagnosis not present

## 2019-01-15 ENCOUNTER — Other Ambulatory Visit: Payer: Self-pay | Admitting: Obstetrics

## 2019-01-15 ENCOUNTER — Telehealth: Payer: Self-pay

## 2019-01-15 NOTE — Telephone Encounter (Signed)
Pt is statting she has BV sx's now

## 2019-01-15 NOTE — Telephone Encounter (Signed)
TC from pt regarding recurrent BV infections after period Pt states she was told by a provider she would get BV after cycles Pt wants continual Rx treatment for BV please advise.

## 2019-01-15 NOTE — Telephone Encounter (Signed)
She has been treated for recent BV.  No need for further treatment unless there is recurrence.

## 2019-01-16 ENCOUNTER — Other Ambulatory Visit: Payer: Self-pay | Admitting: Obstetrics

## 2019-01-16 DIAGNOSIS — B9689 Other specified bacterial agents as the cause of diseases classified elsewhere: Secondary | ICD-10-CM

## 2019-01-16 DIAGNOSIS — N760 Acute vaginitis: Principal | ICD-10-CM

## 2019-01-16 MED ORDER — METRONIDAZOLE 0.75 % VA GEL: 1.0000 | Freq: Two times a day (BID) | VAGINAL | 5 refills | Status: DC

## 2019-01-16 NOTE — Telephone Encounter (Signed)
Metrogel Rx

## 2019-01-31 ENCOUNTER — Telehealth: Payer: Self-pay

## 2019-01-31 NOTE — Telephone Encounter (Signed)
Patient called and reports that the medications we have been giving her to treat her discharge over the past couple months are not working and her vaginal discharge is reoccuring. I advised that patient would need a phone visit with a provider at this point to figure out what medication would be best to treat her symptoms. Pt verbalizes understanding- call transferred for pt to make a phone visit appt.

## 2019-02-01 ENCOUNTER — Encounter: Payer: Self-pay | Admitting: Obstetrics

## 2019-02-01 ENCOUNTER — Ambulatory Visit (INDEPENDENT_AMBULATORY_CARE_PROVIDER_SITE_OTHER): Payer: Medicaid Other | Admitting: Obstetrics

## 2019-02-01 ENCOUNTER — Other Ambulatory Visit: Payer: Self-pay

## 2019-02-01 DIAGNOSIS — Z0000 Encounter for general adult medical examination without abnormal findings: Secondary | ICD-10-CM

## 2019-02-01 DIAGNOSIS — B9689 Other specified bacterial agents as the cause of diseases classified elsewhere: Secondary | ICD-10-CM

## 2019-02-01 DIAGNOSIS — B373 Candidiasis of vulva and vagina: Secondary | ICD-10-CM

## 2019-02-01 DIAGNOSIS — B3731 Acute candidiasis of vulva and vagina: Secondary | ICD-10-CM

## 2019-02-01 DIAGNOSIS — N760 Acute vaginitis: Secondary | ICD-10-CM

## 2019-02-01 MED ORDER — PRENATE MINI 29-0.6-0.4-350 MG PO CAPS: 1.0000 | ORAL_CAPSULE | Freq: Every day | ORAL | 11 refills | Status: DC

## 2019-02-01 MED ORDER — CLOTRIMAZOLE 2 % VA CREA: 1.0000 | TOPICAL_CREAM | Freq: Every day | VAGINAL | 2 refills | Status: DC

## 2019-02-01 MED ORDER — SECNIDAZOLE 2 G PO PACK: 1.0000 | PACK | Freq: Once | ORAL | 2 refills | Status: AC

## 2019-02-01 NOTE — Progress Notes (Signed)
Recurrent yeast infection pt denies any change in soaps and diet.   Pt has tried numerous measures to stop yeast infections.   BV after cycles only.

## 2019-02-01 NOTE — Progress Notes (Signed)
   TELEHEALTH VIRTUAL GYNECOLOGY VISIT ENCOUNTER NOTE  I connected with Andrea Jenkins on 02/01/19 at  2:00 PM EDT by telephone at home and verified that I am speaking with the correct person using two identifiers.   I discussed the limitations, risks, security and privacy concerns of performing an evaluation and management service by telephone and the availability of in person appointments. I also discussed with the patient that there may be a patient responsible charge related to this service. The patient expressed understanding and agreed to proceed.   History:  Andrea Jenkins is a 23 y.o. [id number] female being evaluated today for chronic yeast infections, and BV after menstrual periods. She denies any abnormal  bleeding, pelvic pain or other concerns.       Past Medical History:  Diagnosis Date  . Asthma   . Gonorrhea   . Heart murmur    when younger  . Vaginal Pap smear, abnormal    Past Surgical History:  Procedure Laterality Date  . WISDOM TOOTH EXTRACTION    . WRIST SURGERY     The following portions of the patient's history were reviewed and updated as appropriate: allergies, current medications, past family history, past medical history, past social history, past surgical history and problem list.   Health Maintenance:  Normal pap and negative HRHPV on 02-15-2018.   Review of Systems:  Pertinent items noted in HPI and remainder of comprehensive ROS otherwise negative.  Physical Exam:   General:  Alert, oriented and cooperative.   Mental Status: Normal mood and affect perceived. Normal judgment and thought content.  Physical exam deferred due to nature of the encounter  Labs and Imaging No results found for this or any previous visit (from the past 336 hour(s)). No results found.    Assessment and Plan:     1. BV (bacterial vaginosis) Rx: - Secnidazole (SOLOSEC) 2 g PACK; Take 1 packet by mouth once for 1 dose. Mix as directed with yogurt, pudding, applesauce,  etc.  Dispense: 1 each; Refill: 2  2. Candida vaginitis Rx: - clotrimazole (GYNE-LOTRIMIN 3) 2 % vaginal cream; Place 1 Applicatorful vaginally at bedtime.  Dispense: 21 g; Refill: 2  3. Routine adult health maintenance Rx: - Prenat w/o A-FeCbn-Meth-FA-DHA (PRENATE MINI) 29-0.6-0.4-350 MG CAPS; Take 1 capsule by mouth daily before breakfast.  Dispense: 30 capsule; Refill: 11       I discussed the assessment and treatment plan with the patient. The patient was provided an opportunity to ask questions and all were answered. The patient agreed with the plan and demonstrated an understanding of the instructions.   The patient was advised to call back or seek an in-person evaluation/go to the ED if the symptoms worsen or if the condition fails to improve as anticipated.  I provided 11 minutes of non-face-to-face time during this encounter.   Coral Ceo, MD [name], [facility] 02-01-2019

## 2019-02-24 DIAGNOSIS — B379 Candidiasis, unspecified: Secondary | ICD-10-CM | POA: Diagnosis not present

## 2019-02-24 DIAGNOSIS — T3695XA Adverse effect of unspecified systemic antibiotic, initial encounter: Secondary | ICD-10-CM | POA: Diagnosis not present

## 2019-02-24 DIAGNOSIS — N390 Urinary tract infection, site not specified: Secondary | ICD-10-CM | POA: Diagnosis not present

## 2019-02-24 DIAGNOSIS — Z7251 High risk heterosexual behavior: Secondary | ICD-10-CM | POA: Diagnosis not present

## 2019-02-24 DIAGNOSIS — N898 Other specified noninflammatory disorders of vagina: Secondary | ICD-10-CM | POA: Diagnosis not present

## 2019-02-26 ENCOUNTER — Ambulatory Visit: Payer: Medicaid Other | Admitting: Advanced Practice Midwife

## 2019-02-28 ENCOUNTER — Other Ambulatory Visit: Payer: Self-pay

## 2019-02-28 ENCOUNTER — Ambulatory Visit (INDEPENDENT_AMBULATORY_CARE_PROVIDER_SITE_OTHER): Payer: Medicaid Other | Admitting: Obstetrics and Gynecology

## 2019-02-28 ENCOUNTER — Encounter: Payer: Self-pay | Admitting: Obstetrics and Gynecology

## 2019-02-28 VITALS — BP 116/76 | HR 80 | Wt 178.0 lb

## 2019-02-28 DIAGNOSIS — N761 Subacute and chronic vaginitis: Secondary | ICD-10-CM

## 2019-02-28 NOTE — Progress Notes (Signed)
Patient here for the evaluation of recurrent vaginitis. Patient was seen at [facility] medical 4 days ago and had a vaginal swab. She reports the presence of a white discharge with fishy odor. She is sexually active without contraception and would welcome a pregnancy. She denies pelvic pain or abnormal discharge  Past Medical History:  Diagnosis Date  . Asthma   . Gonorrhea   . Heart murmur    when younger  . Vaginal Pap smear, abnormal    Past Surgical History:  Procedure Laterality Date  . WISDOM TOOTH EXTRACTION    . WRIST SURGERY     Family History  Problem Relation Age of Onset  . Hypertension Mother   . Miscarriages / [place] Mother   . Hypertension Maternal Grandmother   . Asthma Maternal Grandmother   . Diabetes Maternal Grandmother   . Vision loss Maternal Grandmother   . Mental illness Brother   . Cancer Maternal Aunt   . Heart disease Neg Hx    Social History   Tobacco Use  . Smoking status: Former Smoker    Packs/day: 0.25    Types: Cigars    Last attempt to quit: 03/11/2017    Years since quitting: 1.9  . Smokeless tobacco: Never Used  Substance Use Topics  . Alcohol use: No    Alcohol/week: 0.0 standard drinks  . Drug use: No   ROS See pertinent in HPI  Blood pressure 116/76, pulse 80, weight 178 lb (80.7 kg), currently breastfeeding. GENERAL: Well-developed, well-nourished female in no acute distress.  EXTREMITIES: No cyanosis, clubbing, or edema, 2+ distal pulses.  A/P 23 yo with vaginitis - follow up on vaginal swab results from [facility] - Information on perineal care provided - patient with normal pap smear 01/2018 - RTC prn

## 2019-02-28 NOTE — Progress Notes (Signed)
Pt is here with c/o chronic vaginal discharge. Last treated 02/01/2019 with Solosec 2g pack and Clotirmazole 2% vaginal cream. Pt states she finished treatment and her symptoms did not improve. Pt currently c/o white discharge and constant irritation. Pt state she had vaginal swab done at [facility] center on 02/24/2019.

## 2019-04-03 ENCOUNTER — Other Ambulatory Visit: Payer: Self-pay

## 2019-04-03 ENCOUNTER — Ambulatory Visit (INDEPENDENT_AMBULATORY_CARE_PROVIDER_SITE_OTHER): Payer: Medicaid Other

## 2019-04-03 ENCOUNTER — Other Ambulatory Visit (HOSPITAL_COMMUNITY)
Admission: RE | Admit: 2019-04-03 | Discharge: 2019-04-03 | Disposition: A | Discharge status: Home / Self Care | Payer: Medicaid Other | Source: Ambulatory Visit | Attending: Obstetrics | Admitting: Obstetrics

## 2019-04-03 DIAGNOSIS — N898 Other specified noninflammatory disorders of vagina: Secondary | ICD-10-CM | POA: Diagnosis not present

## 2019-04-03 NOTE — Progress Notes (Signed)
Pt has c/o vaginal discharge and odor for a few days. Pt denies itching. Pt reports she is not currently breast feeding. Pt instructed to do self swab and advised we will let her know about the results, pt verbalized understanding.

## 2019-04-04 LAB — CERVICOVAGINAL ANCILLARY ONLY
Bacterial vaginitis: NEGATIVE
Candida vaginitis: POSITIVE — AB
Chlamydia: NEGATIVE
Neisseria Gonorrhea: NEGATIVE
Trichomonas: NEGATIVE

## 2019-04-05 ENCOUNTER — Other Ambulatory Visit: Payer: Self-pay | Admitting: Obstetrics

## 2019-04-05 ENCOUNTER — Telehealth: Payer: Self-pay

## 2019-04-05 DIAGNOSIS — B373 Candidiasis of vulva and vagina: Secondary | ICD-10-CM

## 2019-04-05 DIAGNOSIS — B3731 Acute candidiasis of vulva and vagina: Secondary | ICD-10-CM

## 2019-04-05 MED ORDER — FLUCONAZOLE 150 MG PO TABS: 150.0000 mg | ORAL_TABLET | Freq: Once | ORAL | 0 refills | Status: DC

## 2019-04-05 NOTE — Telephone Encounter (Signed)
S/w pt about results and rx sent

## 2019-04-08 ENCOUNTER — Other Ambulatory Visit: Payer: Self-pay

## 2019-04-08 ENCOUNTER — Encounter (HOSPITAL_COMMUNITY): Payer: Self-pay | Admitting: *Deleted

## 2019-04-08 ENCOUNTER — Inpatient Hospital Stay (HOSPITAL_COMMUNITY)
Admission: EM | Admit: 2019-04-08 | Discharge: 2019-04-08 | Disposition: A | Discharge status: Home / Self Care | Payer: Medicaid Other | Attending: Obstetrics and Gynecology | Admitting: Obstetrics and Gynecology

## 2019-04-08 DIAGNOSIS — Z3201 Encounter for pregnancy test, result positive: Secondary | ICD-10-CM

## 2019-04-08 DIAGNOSIS — O210 Mild hyperemesis gravidarum: Secondary | ICD-10-CM | POA: Insufficient documentation

## 2019-04-08 DIAGNOSIS — Z3A01 Less than 8 weeks gestation of pregnancy: Secondary | ICD-10-CM | POA: Diagnosis not present

## 2019-04-08 DIAGNOSIS — O219 Vomiting of pregnancy, unspecified: Secondary | ICD-10-CM

## 2019-04-08 DIAGNOSIS — Z87891 Personal history of nicotine dependence: Secondary | ICD-10-CM | POA: Insufficient documentation

## 2019-04-08 LAB — URINALYSIS, ROUTINE W REFLEX MICROSCOPIC
Bilirubin Urine: NEGATIVE
Glucose, UA: NEGATIVE mg/dL
Hgb urine dipstick: NEGATIVE
Ketones, ur: NEGATIVE mg/dL
Leukocytes,Ua: NEGATIVE
Nitrite: NEGATIVE
Protein, ur: NEGATIVE mg/dL
Specific Gravity, Urine: 1.02 (ref 1.005–1.030)
pH: 6 (ref 5.0–8.0)

## 2019-04-08 LAB — POCT PREGNANCY, URINE: Preg Test, Ur: POSITIVE — AB

## 2019-04-08 MED ORDER — METOCLOPRAMIDE HCL 10 MG PO TABS: 10.0000 mg | ORAL_TABLET | Freq: Three times a day (TID) | ORAL | 1 refills | Status: DC

## 2019-04-08 NOTE — MAU Provider Note (Signed)
History     CSN: [id number]  Arrival date and time: 04/08/19 [id number]   First Provider Initiated Contact with Patient 04/08/19 0636      Chief Complaint  Patient presents with  . Emesis  . Nausea   HPI   [name] is  23 y.o. female G2P1001 @ 44101w5d here in MAU to confirm pregnancy and discuss  N/V. Says she is vomiting 1x per day. She is able to keep down some fluids and some foods. No vaginal bleeding, no pain. She is not taking any medications for the symptoms.    OB History    Gravida  2   Para  1   Term  1   Preterm  0   AB  0   Living  1     SAB  0   TAB  0   Ectopic  0   Multiple  0   Live Births  1           Past Medical History:  Diagnosis Date  . Asthma   . Gonorrhea   . Heart murmur    when younger  . Vaginal Pap smear, abnormal     Past Surgical History:  Procedure Laterality Date  . CATARACT EXTRACTION    . WISDOM TOOTH EXTRACTION    . WRIST SURGERY      Family History  Problem Relation Age of Onset  . Hypertension Mother   . Miscarriages / [place] Mother   . Hypertension Maternal Grandmother   . Asthma Maternal Grandmother   . Diabetes Maternal Grandmother   . Vision loss Maternal Grandmother   . Mental illness Brother   . Cancer Maternal Aunt   . Heart disease Neg Hx     Social History   Tobacco Use  . Smoking status: Former Smoker    Packs/day: 0.25    Types: Cigars    Quit date: 03/11/2017    Years since quitting: 2.0  . Smokeless tobacco: Never Used  Substance Use Topics  . Alcohol use: No    Alcohol/week: 0.0 standard drinks    Comment: mimosa on saturday. 04/06/19  . Drug use: No    Allergies:  Allergies  Allergen Reactions  . Peach [Prunus Persica] Hives    Medications Prior to Admission  Medication Sig Dispense Refill Last Dose  . clotrimazole (GYNE-LOTRIMIN 3) 2 % vaginal cream Place 1 Applicatorful vaginally at bedtime. 21 g 2   . metroNIDAZOLE (FLAGYL) 500 MG tablet Take 1 tablet (500  mg total) by mouth 2 (two) times daily. (Patient not taking: Reported on 02/01/2019) 14 tablet 0   . metroNIDAZOLE (METROGEL VAGINAL) 0.75 % vaginal gel Place 1 Applicatorful vaginally 2 (two) times daily. (Patient not taking: Reported on 02/01/2019) 70 g 5   . norethindrone (ORTHO MICRONOR) 0.35 MG tablet Take 1 tablet (0.35 mg total) by mouth daily. (Patient not taking: Reported on 02/01/2019) 1 Package 11   . Prenat w/o A-FeCbn-Meth-FA-DHA (PRENATE MINI) 29-0.6-0.4-350 MG CAPS Take 1 capsule by mouth daily before breakfast. (Patient not taking: Reported on 02/28/2019) 30 capsule 11   . terconazole (TERAZOL 7) 0.4 % vaginal cream Place 1 applicator vaginally at bedtime. (Patient not taking: Reported on 02/01/2019) 45 g 0    Results for orders placed or performed during the hospital encounter of 04/08/19 (from the past 48 hour(s))  Urinalysis, Routine w reflex microscopic     Status: None   Collection Time: 04/08/19  5:56 AM  Result Value Ref  Range   Color, Urine YELLOW YELLOW   APPearance CLEAR CLEAR   Specific Gravity, Urine 1.020 1.005 - 1.030   pH 6.0 5.0 - 8.0   Glucose, UA NEGATIVE NEGATIVE mg/dL   Hgb urine dipstick NEGATIVE NEGATIVE   Bilirubin Urine NEGATIVE NEGATIVE   Ketones, ur NEGATIVE NEGATIVE mg/dL   Protein, ur NEGATIVE NEGATIVE mg/dL   Nitrite NEGATIVE NEGATIVE   Leukocytes,Ua NEGATIVE NEGATIVE    Comment: Performed at [facility] [address], [place], [place] [address]  Pregnancy, urine POC     Status: Abnormal   Collection Time: 04/08/19  5:58 AM  Result Value Ref Range   Preg Test, Ur POSITIVE (A) NEGATIVE    Comment:        THE SENSITIVITY OF THIS METHODOLOGY IS >24 mIU/mL    Review of Systems  Constitutional: Negative for fever.  Gastrointestinal: Negative for abdominal pain.  Genitourinary: Negative for vaginal bleeding.   Physical Exam   Blood pressure 123/83, pulse 79, temperature 99.3 F (37.4 C), temperature source Oral, resp. rate 18, height 5\' 3"   (1.6 m), weight 82.4 kg, last menstrual period 03/06/2019, not currently breastfeeding.  Physical Exam  Constitutional: She is oriented to person, place, and time. She appears well-developed and well-nourished.  HENT:  Head: Normocephalic.  Respiratory: Effort normal.  Musculoskeletal: Normal range of motion.  Neurological: She is alert and oriented to person, place, and time.  Skin: Skin is warm.  Psychiatric: Her behavior is normal.   MAU Course  Procedures  None  MDM  UA without signs of dehydration Work note provided   Assessment and Plan   A:  1. Nausea and vomiting during pregnancy   2. Pregnancy test performed, pregnancy confirmed     P:  Discharge home in stable condition Rx: Reglan Start prenatal care Return to MAU for emergencies.  Noni Saupe I, NP 04/08/2019 11:14 AM

## 2019-04-08 NOTE — Discharge Instructions (Signed)
Morning Sickness    Morning sickness is when you feel sick to your stomach (nauseous) during pregnancy. You may feel sick to your stomach and throw up (vomit). You may feel sick in the morning, but you can feel this way at any time of day. Some women feel very sick to their stomach and cannot stop throwing up (hyperemesis gravidarum).  Follow these instructions at home:  Medicines   Take over-the-counter and prescription medicines only as told by your doctor. Do not take any medicines until you talk with your doctor about them first.   Taking multivitamins before getting pregnant can stop or lessen the harshness of morning sickness.  Eating and drinking   Eat dry toast or crackers before getting out of bed.   Eat 5 or 6 small meals a day.   Eat dry and bland foods like rice and baked potatoes.   Do not eat greasy, fatty, or spicy foods.   Have someone cook for you if the smell of food causes you to feel sick or throw up.   If you feel sick to your stomach after taking prenatal vitamins, take them at night or with a snack.   Eat protein when you need a snack. Nuts, yogurt, and cheese are good choices.   Drink fluids throughout the day.   Try ginger ale made with real ginger, ginger tea made from fresh grated ginger, or ginger candies.  General instructions   Do not use any products that have nicotine or tobacco in them, such as cigarettes and e-cigarettes. If you need help quitting, ask your doctor.   Use an air purifier to keep the air in your house free of smells.   Get lots of fresh air.   Try to avoid smells that make you feel sick.   Try:  ? Wearing a bracelet that is used for seasickness (acupressure wristband).  ? Going to a doctor who puts thin needles into certain body points (acupuncture) to improve how you feel.  Contact a doctor if:   You need medicine to feel better.   You feel dizzy or light-headed.   You are losing weight.  Get help right away if:   You feel very sick to your  stomach and cannot stop throwing up.   You pass out (faint).   You have very bad pain in your belly.  Summary   Morning sickness is when you feel sick to your stomach (nauseous) during pregnancy.   You may feel sick in the morning, but you can feel this way at any time of day.   Making some changes to what you eat may help your symptoms go away.  This information is not intended to replace advice given to you by your health care provider. Make sure you discuss any questions you have with your health care provider.  Document Released: 11/18/2004 Document Revised: 11/11/2016 Document Reviewed: 11/11/2016  Elsevier Interactive Patient Education  2019 [employer]

## 2019-04-08 NOTE — MAU Note (Signed)
Pt presents to MAU c/o +HPT and stating she is having nausea and vomiting. Pt reports she vomits at least one time a day. Pt reports she gets woozie at times. Pt reports she could be eating and loose appetite due to NV. No abdominal pain. No vaginal bleeding. Pt reports at her doctor they diagnosed her with a yeast infection.

## 2019-04-10 ENCOUNTER — Inpatient Hospital Stay (HOSPITAL_COMMUNITY): Payer: Medicaid Other

## 2019-04-10 ENCOUNTER — Encounter (HOSPITAL_COMMUNITY): Payer: Self-pay

## 2019-04-10 ENCOUNTER — Other Ambulatory Visit: Payer: Self-pay

## 2019-04-10 ENCOUNTER — Inpatient Hospital Stay (HOSPITAL_COMMUNITY)
Admission: AD | Admit: 2019-04-10 | Discharge: 2019-04-10 | Disposition: A | Discharge status: Home / Self Care | Payer: Medicaid Other | Attending: Obstetrics and Gynecology | Admitting: Obstetrics and Gynecology

## 2019-04-10 DIAGNOSIS — R1030 Lower abdominal pain, unspecified: Secondary | ICD-10-CM | POA: Insufficient documentation

## 2019-04-10 DIAGNOSIS — O3680X0 Pregnancy with inconclusive fetal viability, not applicable or unspecified: Secondary | ICD-10-CM

## 2019-04-10 DIAGNOSIS — Z3A01 Less than 8 weeks gestation of pregnancy: Secondary | ICD-10-CM | POA: Insufficient documentation

## 2019-04-10 DIAGNOSIS — Z87891 Personal history of nicotine dependence: Secondary | ICD-10-CM | POA: Diagnosis not present

## 2019-04-10 DIAGNOSIS — R109 Unspecified abdominal pain: Secondary | ICD-10-CM | POA: Diagnosis not present

## 2019-04-10 DIAGNOSIS — O209 Hemorrhage in early pregnancy, unspecified: Secondary | ICD-10-CM | POA: Diagnosis not present

## 2019-04-10 DIAGNOSIS — Z3A00 Weeks of gestation of pregnancy not specified: Secondary | ICD-10-CM | POA: Diagnosis not present

## 2019-04-10 LAB — URINALYSIS, ROUTINE W REFLEX MICROSCOPIC
Bilirubin Urine: NEGATIVE
Glucose, UA: NEGATIVE mg/dL
Hgb urine dipstick: NEGATIVE
Ketones, ur: NEGATIVE mg/dL
Leukocytes,Ua: NEGATIVE
Nitrite: NEGATIVE
Protein, ur: NEGATIVE mg/dL
Specific Gravity, Urine: 1.026 (ref 1.005–1.030)
pH: 5 (ref 5.0–8.0)

## 2019-04-10 LAB — CBC
HCT: 38 % (ref 36.0–46.0)
Hemoglobin: 12.3 g/dL (ref 12.0–15.0)
MCH: 27.3 pg (ref 26.0–34.0)
MCHC: 32.4 g/dL (ref 30.0–36.0)
MCV: 84.3 fL (ref 80.0–100.0)
Platelets: 193 10*3/uL (ref 150–400)
RBC: 4.51 MIL/uL (ref 3.87–5.11)
RDW: 12.8 % (ref 11.5–15.5)
WBC: 3.8 10*3/uL — ABNORMAL LOW (ref 4.0–10.5)
nRBC: 0 % (ref 0.0–0.2)

## 2019-04-10 LAB — HCG, QUANTITATIVE, PREGNANCY: hCG, Beta Chain, Quant, S: 566 m[IU]/mL — ABNORMAL HIGH (ref <5)

## 2019-04-10 NOTE — Discharge Instructions (Signed)
Ectopic Pregnancy ° °An ectopic pregnancy is when the fertilized egg attaches (implants) outside the uterus. Most ectopic pregnancies occur in one of the tubes where eggs travel from the ovary to the uterus (fallopian tubes), but the implanting can occur in other locations. In rare cases, ectopic pregnancies occur on the ovary, intestine, pelvis, abdomen, or cervix. In an ectopic pregnancy, the fertilized egg does not have the ability to develop into a normal, healthy baby. °A ruptured ectopic pregnancy is one in which tearing or bursting of a fallopian tube causes internal bleeding. Often, there is intense lower abdominal pain, and vaginal bleeding sometimes occurs. Having an ectopic pregnancy can be life-threatening. If this dangerous condition is not treated, it can lead to blood loss, shock, or even death. °What are the causes? °The most common cause of this condition is damage to one of the fallopian tubes. A fallopian tube may be narrowed or blocked, and that keeps the fertilized egg from reaching the uterus. °What increases the risk? °This condition is more likely to develop in women of childbearing age who have different levels of risk. The levels of risk can be divided into three categories. °High risk °· You have gone through infertility treatment. °· You have had an ectopic pregnancy before. °· You have had surgery on the fallopian tubes, or another surgical procedure, such as an abortion. °· You have had surgery to have the fallopian tubes tied (tubal ligation). °· You have problems or diseases of the fallopian tubes. °· You have been exposed to diethylstilbestrol (DES). This medicine was used until 1971, and it had effects on babies whose mothers took the medicine. °· You become pregnant while using an IUD (intrauterine device) for birth control. °Moderate risk °· You have a history of infertility. °· You have had an STI (sexually transmitted infection). °· You have a history of pelvic inflammatory  disease (PID). °· You have scarring from endometriosis. °· You have multiple sexual partners. °· You smoke. °Low risk °· You have had pelvic surgery. °· You use vaginal douches. °· You became sexually active before age 18. °What are the signs or symptoms? °Common symptoms of this condition include normal pregnancy symptoms, such as missing a period, nausea, tiredness, abdominal pain, breast tenderness, and bleeding. However, ectopic pregnancy will have additional symptoms, such as: °· Pain with intercourse. °· Irregular vaginal bleeding or spotting. °· Cramping or pain on one side or in the lower abdomen. °· Fast heartbeat, low blood pressure, and sweating. °· Passing out while having a bowel movement. °Symptoms of a ruptured ectopic pregnancy and internal bleeding may include: °· Sudden, severe pain in the abdomen and pelvis. °· Dizziness, weakness, light-headedness, or fainting. °· Pain in the shoulder or neck area. °How is this diagnosed? °This condition is diagnosed by: °· A pelvic exam to locate pain or a mass in the abdomen. °· A pregnancy test. This blood test checks for the presence as well as the specific level of pregnancy hormone in the bloodstream. °· Ultrasound. This is performed if a pregnancy test is positive. In this test, a probe is inserted into the vagina. The probe will detect a fetus, possibly in a location other than the uterus. °· Taking a sample of uterus tissue (dilation and curettage, or D&C). °· Surgery to perform a visual exam of the inside of the abdomen using a thin, lighted tube that has a tiny camera on the end (laparoscope). °· Culdocentesis. This procedure involves inserting a needle at the top of   the vagina, behind the uterus. If blood is present in this area, it may indicate that a fallopian tube is torn. °How is this treated? °This condition is treated with medicine or surgery. °Medicine °· An injection of a medicine (methotrexate) may be given to cause the pregnancy tissue to be  absorbed. This medicine may save your fallopian tube. It may be given if: °? The diagnosis is made early, with no signs of active bleeding. °? The fallopian tube has not ruptured. °? You are considered to be a good candidate for the medicine. °Usually, pregnancy hormone blood levels are checked after methotrexate treatment. This is to be sure that the medicine is effective. It may take 4-6 weeks for the pregnancy to be absorbed. Most pregnancies will be absorbed by 3 weeks. °Surgery °· A laparoscope may be used to remove the pregnancy tissue. °· If severe internal bleeding occurs, a larger cut (incision) may be made in the lower abdomen (laparotomy) to remove the fetus and placenta. This is done to stop the bleeding. °· Part or all of the fallopian tube may be removed (salpingectomy) along with the fetus and placenta. The fallopian tube may also be repaired during the surgery. °· In very rare circumstances, removal of the uterus (hysterectomy) may be required. °· After surgery, pregnancy hormone testing may be done to be sure that there is no pregnancy tissue left. °Whether your treatment is medicine or surgery, you may receive a Rho (D) immune globulin shot to prevent problems with any future pregnancy. This shot may be given if: °· You are Rh-negative and the baby's father is Rh-positive. °· You are Rh-negative and you do not know the Rh type of the baby's father. °Follow these instructions at home: °· Rest and limit your activity after the procedure for as long as told by your health care provider. °· Until your health care provider says that it is safe: °? Do not lift anything that is heavier than 10 lb (4.5 kg), or the limit that your health care provider tells you. °? Avoid physical exercise and any movement that requires effort (is strenuous). °· To help prevent constipation: °? Eat a healthy diet that includes fruits, vegetables, and whole grains. °? Drink 6-8 glasses of water per day. °Get help right away  if: °· You develop worsening pain that is not relieved by medicine. °· You have: °? A fever or chills. °? Vaginal bleeding. °? Redness and swelling at the incision site. °? Nausea and vomiting. °· You feel dizzy or weak. °· You feel light-headed or you faint. °This information is not intended to replace advice given to you by your health care provider. Make sure you discuss any questions you have with your health care provider. °Document Released: 11/18/2004 Document Revised: 06/09/2016 Document Reviewed: 05/12/2016 °Elsevier Interactive Patient Education © 2019 Elsevier Inc. ° °

## 2019-04-10 NOTE — MAU Provider Note (Signed)
History     CSN: [id number]  Arrival date and time: 04/10/19 1240   First Provider Initiated Contact with Patient 04/10/19 1330      Chief Complaint  Patient presents with  . Emesis  . Abdominal Pain   Andrea Jenkins is a 23 y.o. G2P1001 at [redacted]w[redacted]d who presents today with lower abdominal pain. She denies any vaginal bleeding.   Abdominal Pain This is a new problem. The current episode started yesterday. The onset quality is sudden. The problem occurs constantly. The problem has been unchanged. The pain is located in the suprapubic region. The pain is at a severity of 6/10. The quality of the pain is cramping. The abdominal pain does not radiate. Pertinent negatives include no constipation, diarrhea, dysuria, fever, frequency, nausea or vomiting. Nothing aggravates the pain. The pain is relieved by nothing. She has tried acetaminophen for the symptoms. The treatment provided mild relief.    OB History    Gravida  2   Para  1   Term  1   Preterm  0   AB  0   Living  1     SAB  0   TAB  0   Ectopic  0   Multiple  0   Live Births  1           Past Medical History:  Diagnosis Date  . Asthma    pt states she doesn't use inhaler much (04/10/19)  . Gonorrhea   . Heart murmur    when younger  . Vaginal Pap smear, abnormal     Past Surgical History:  Procedure Laterality Date  . CATARACT EXTRACTION    . WISDOM TOOTH EXTRACTION    . WRIST SURGERY      Family History  Problem Relation Age of Onset  . Hypertension Mother   . Miscarriages / [place] Mother   . Hypertension Maternal Grandmother   . Asthma Maternal Grandmother   . Diabetes Maternal Grandmother   . Vision loss Maternal Grandmother   . Mental illness Brother   . Cancer Maternal Aunt   . Heart disease Neg Hx     Social History   Tobacco Use  . Smoking status: Former Smoker    Packs/day: 0.25    Types: Cigars    Quit date: 03/11/2017    Years since quitting: 2.0  . Smokeless  tobacco: Never Used  Substance Use Topics  . Alcohol use: No    Alcohol/week: 0.0 standard drinks    Comment: mimosa on saturday. 04/06/19  . Drug use: No    Allergies:  Allergies  Allergen Reactions  . Peach [Prunus Persica] Hives    Medications Prior to Admission  Medication Sig Dispense Refill Last Dose  . metoCLOPramide (REGLAN) 10 MG tablet Take 1 tablet (10 mg total) by mouth 4 (four) times daily -  before meals and at bedtime. 90 tablet 1   . Prenat w/o A-FeCbn-Meth-FA-DHA (PRENATE MINI) 29-0.6-0.4-350 MG CAPS Take 1 capsule by mouth daily before breakfast. (Patient not taking: Reported on 02/28/2019) 30 capsule 11     Review of Systems  Constitutional: Negative for chills and fever.  Gastrointestinal: Positive for abdominal pain. Negative for constipation, diarrhea, nausea and vomiting.  Genitourinary: Positive for pelvic pain. Negative for dysuria, frequency, vaginal bleeding and vaginal discharge.   Physical Exam   Blood pressure 114/73, pulse 81, temperature 98.2 F (36.8 C), temperature source Oral, resp. rate 18, last menstrual period 03/06/2019, SpO2 100 %, not currently  breastfeeding.  Physical Exam  Nursing note and vitals reviewed. Constitutional: She is oriented to person, place, and time. She appears well-developed and well-nourished. No distress.  HENT:  Head: Normocephalic.  Cardiovascular: Normal rate.  Respiratory: Effort normal.  GI: Soft. There is no abdominal tenderness. There is no rebound.  Neurological: She is alert and oriented to person, place, and time.  Skin: Skin is warm and dry.  Psychiatric: She has a normal mood and affect.   Results for orders placed or performed during the hospital encounter of 04/10/19 (from the past 24 hour(s))  Urinalysis, Routine w reflex microscopic     Status: None   Collection Time: 04/10/19  1:19 PM  Result Value Ref Range   Color, Urine YELLOW YELLOW   APPearance CLEAR CLEAR   Specific Gravity, Urine 1.026  1.005 - 1.030   pH 5.0 5.0 - 8.0   Glucose, UA NEGATIVE NEGATIVE mg/dL   Hgb urine dipstick NEGATIVE NEGATIVE   Bilirubin Urine NEGATIVE NEGATIVE   Ketones, ur NEGATIVE NEGATIVE mg/dL   Protein, ur NEGATIVE NEGATIVE mg/dL   Nitrite NEGATIVE NEGATIVE   Leukocytes,Ua NEGATIVE NEGATIVE  CBC     Status: Abnormal   Collection Time: 04/10/19  2:33 PM  Result Value Ref Range   WBC 3.8 (L) 4.0 - 10.5 K/uL   RBC 4.51 3.87 - 5.11 MIL/uL   Hemoglobin 12.3 12.0 - 15.0 g/dL   HCT 16.138.0 09.636.0 - 04.546.0 %   MCV 84.3 80.0 - 100.0 fL   MCH 27.3 26.0 - 34.0 pg   MCHC 32.4 30.0 - 36.0 g/dL   RDW 40.912.8 81.111.5 - 91.415.5 %   Platelets 193 150 - 400 K/uL   nRBC 0.0 0.0 - 0.2 %   [place] Ob Less Than 14 Weeks With Ob Transvaginal  Result Date: 04/10/2019 CLINICAL DATA:  Vaginal bleeding in 1st trimester pregnancy. Lower abdominal pain for 2 days. EXAM: OBSTETRIC <14 WK US AND TRANSVAGINAL OB US TECHNIQUE: Both transabdominal and transvaginal ultrasound examinations were performed for complete evaluation of the gestation as well as the maternal uterus, adnexal regions, and pelvic cul-de-sac. Transvaginal technique was performed to assess early pregnancy. COMPARISON:  None. FINDINGS: Intrauterine gestational sac: None Maternal uterus/adnexae: Endometrial thickness measures 16 mm. No fibroids identified. Both ovaries are normal in appearance. No mass or abnormal free fluid identified. IMPRESSION: Pregnancy of unknown anatomic location (no intrauterine gestational sac or adnexal mass identified). Differential diagnosis includes recent spontaneous abortion, IUP too early to visualize, and non-visualized ectopic pregnancy. Recommend correlation with serial beta-hCG levels, and follow up US if warranted clinically. Electronically Signed   By: Myles RosenthalJohn  Stahl M.D.   On: 04/10/2019 15:20     MAU Course  Procedures  MDM Patient states that she needs to leave. HCG has not resulted at this time. Patient's abdomen is soft and non-tender.  Ectopic precautions reviewed in detail, and patient scheduled for FU HCG in 48 hours at the clinic.   Assessment and Plan   1. Pregnancy of unknown anatomic location   2. Vaginal bleeding in pregnancy, first trimester   3. [redacted] weeks gestation of pregnancy   4. A positive blood type    DC home Comfort measures reviewed  1st Trimester precautions  Bleeding precautions Ectopic precautions RX: none  Return to MAU as needed FU with OB as planned  Follow-up Information    [facility].   Specialty: Obstetrics and Gynecology Why: FU HCG in 48 hours at the clinic  Contact information: [address], [address] [id number] [place] [phone]         Thressa ShellerHeather Hogan DNP, CNM  04/10/19  3:29 PM

## 2019-04-10 NOTE — MAU Note (Signed)
Sent from ED 

## 2019-04-10 NOTE — ED Notes (Signed)
Called [place] [facility]U  Called transported stated, It will be awhile she is with a pt.

## 2019-04-10 NOTE — MAU Note (Addendum)
Pt states constant lower abdm sharp pain x 2days.  Pt states last BM was today.  Took Tyelnol at 1000 & has helped "a little bit". Denies vag bleeding.  Pt states she has been vomiting since last pm; has vomited approx 4-5 times in last 24hrs. Denies fever or diarrhea. Pt states she has not taken prescribed reglan b/c she is unable to keep liquids down.

## 2019-04-11 ENCOUNTER — Encounter (HOSPITAL_COMMUNITY): Payer: Self-pay

## 2019-04-11 ENCOUNTER — Inpatient Hospital Stay (HOSPITAL_COMMUNITY)
Admission: AD | Admit: 2019-04-11 | Discharge: 2019-04-11 | Disposition: A | Discharge status: Home / Self Care | Payer: Medicaid Other | Attending: Obstetrics and Gynecology | Admitting: Obstetrics and Gynecology

## 2019-04-11 ENCOUNTER — Other Ambulatory Visit: Payer: Self-pay

## 2019-04-11 DIAGNOSIS — O99511 Diseases of the respiratory system complicating pregnancy, first trimester: Secondary | ICD-10-CM | POA: Diagnosis not present

## 2019-04-11 DIAGNOSIS — J45909 Unspecified asthma, uncomplicated: Secondary | ICD-10-CM | POA: Insufficient documentation

## 2019-04-11 DIAGNOSIS — Z833 Family history of diabetes mellitus: Secondary | ICD-10-CM | POA: Diagnosis not present

## 2019-04-11 DIAGNOSIS — M549 Dorsalgia, unspecified: Secondary | ICD-10-CM

## 2019-04-11 DIAGNOSIS — Z3A01 Less than 8 weeks gestation of pregnancy: Secondary | ICD-10-CM | POA: Diagnosis not present

## 2019-04-11 DIAGNOSIS — Z809 Family history of malignant neoplasm, unspecified: Secondary | ICD-10-CM | POA: Diagnosis not present

## 2019-04-11 DIAGNOSIS — O9989 Other specified diseases and conditions complicating pregnancy, childbirth and the puerperium: Secondary | ICD-10-CM | POA: Diagnosis not present

## 2019-04-11 DIAGNOSIS — Z91018 Allergy to other foods: Secondary | ICD-10-CM | POA: Insufficient documentation

## 2019-04-11 DIAGNOSIS — Z87891 Personal history of nicotine dependence: Secondary | ICD-10-CM | POA: Diagnosis not present

## 2019-04-11 LAB — URINALYSIS, ROUTINE W REFLEX MICROSCOPIC
Bilirubin Urine: NEGATIVE
Glucose, UA: NEGATIVE mg/dL
Hgb urine dipstick: NEGATIVE
Ketones, ur: NEGATIVE mg/dL
Leukocytes,Ua: NEGATIVE
Nitrite: NEGATIVE
Protein, ur: NEGATIVE mg/dL
Specific Gravity, Urine: 1.028 (ref 1.005–1.030)
pH: 5 (ref 5.0–8.0)

## 2019-04-11 LAB — HCG, QUANTITATIVE, PREGNANCY: hCG, Beta Chain, Quant, S: 770 m[IU]/mL — ABNORMAL HIGH (ref <5)

## 2019-04-11 MED ORDER — CYCLOBENZAPRINE HCL 10 MG PO TABS: 10.0000 mg | ORAL_TABLET | Freq: Three times a day (TID) | ORAL | 0 refills | Status: DC | PRN

## 2019-04-11 MED ORDER — ACETAMINOPHEN 500 MG PO TABS
1000.0000 mg | ORAL_TABLET | Freq: Once | ORAL | Status: AC
  Administered 2019-04-11: 1000 mg via ORAL
  Filled 2019-04-11: qty 2

## 2019-04-11 NOTE — MAU Note (Signed)
Andrea Jenkins is a 23 y.o. at [redacted]w[redacted]d here in MAU reporting: back pain since last night, no bleeding. Took some ibuprofen at home with no relief.   Onset of complaint: last night  Pain score: 7/10  Vitals:   04/11/19 1215  BP: 127/68  Pulse: 98  Resp: 18  Temp: 98.4 F (36.9 C)  SpO2: 100%      Lab orders placed from triage: UA

## 2019-04-11 NOTE — Discharge Instructions (Signed)
Back Pain in Pregnancy  Back pain during pregnancy is common. Back pain may be caused by several factors that are related to changes during your pregnancy.  Follow these instructions at home:  Managing pain, stiffness, and swelling          If directed, for sudden (acute) back pain, put ice on the painful area.  ? Put ice in a plastic bag.  ? Place a towel between your skin and the bag.  ? Leave the ice on for 20 minutes, 2-3 times per day.   If directed, apply heat to the affected area before you exercise. Use the heat source that your health care provider recommends, such as a moist heat pack or a heating pad.  ? Place a towel between your skin and the heat source.  ? Leave the heat on for 20-30 minutes.  ? Remove the heat if your skin turns bright red. This is especially important if you are unable to feel pain, heat, or cold. You may have a greater risk of getting burned.   If directed, massage the affected area.  Activity   Exercise as told by your health care provider. Gentle exercise is the best way to prevent or manage back pain.   Listen to your body when lifting. If lifting hurts, ask for help or bend your knees. This uses your leg muscles instead of your back muscles.   Squat down when picking up something from the floor. Do not bend over.   Only use bed rest for short periods as told by your health care provider. Bed rest should only be used for the most severe episodes of back pain.  Standing, sitting, and lying down   Do not stand in one place for long periods of time.   Use good posture when sitting. Make sure your head rests over your shoulders and is not hanging forward. Use a pillow on your lower back if necessary.   Try sleeping on your side, preferably the left side, with a pregnancy support pillow or 1-2 regular pillows between your legs.  ? If you have back pain after a night's rest, your bed may be too soft.  ? A firm mattress may provide more support for your back during  pregnancy.  General instructions   Do not wear high heels.   Eat a healthy diet. Try to gain weight within your health care provider's recommendations.   Use a maternity girdle, elastic sling, or back brace as told by your health care provider.   Take over-the-counter and prescription medicines only as told by your health care provider.   Work with a physical therapist or massage therapist to find ways to manage back pain. Acupuncture or massage therapy may be helpful.   Keep all follow-up visits as told by your health care provider. This is important.  Contact a health care provider if:   Your back pain interferes with your daily activities.   You have increasing pain in other parts of your body.  Get help right away if:   You develop numbness, tingling, weakness, or problems with the use of your arms or legs.   You develop severe back pain that is not controlled with medicine.   You have a change in bowel or bladder control.   You develop shortness of breath, dizziness, or you faint.   You develop nausea, vomiting, or sweating.   You have back pain that is a rhythmic, cramping pain similar to labor pains. Labor   pain is usually 1-2 minutes apart, lasts for about 1 minute, and involves a bearing down feeling or pressure in your pelvis.   You have back pain and your water breaks or you have vaginal bleeding.   You have back pain or numbness that travels down your leg.   Your back pain developed after you fell.   You develop pain on one side of your back.   You see blood in your urine.   You develop skin blisters in the area of your back pain.  Summary   Back pain may be caused by several factors that are related to changes during your pregnancy.   Follow instructions as told by your health care provider for managing pain, stiffness, and swelling.   Exercise as told by your health care provider. Gentle exercise is the best way to prevent or manage back pain.   Take over-the-counter and  prescription medicines only as told by your health care provider.   Keep all follow-up visits as told by your health care provider. This is important.  This information is not intended to replace advice given to you by your health care provider. Make sure you discuss any questions you have with your health care provider.  Document Released: 01/19/2006 Document Revised: 03/29/2018 Document Reviewed: 03/29/2018  Elsevier Interactive Patient Education  2019 [employer]

## 2019-04-11 NOTE — MAU Provider Note (Signed)
History     CSN: [id number]  Arrival date and time: 04/11/19 1159   First Provider Initiated Contact with Patient 04/11/19 1323      Chief Complaint  Patient presents with  . Back Pain   HPI   [name] is a 23 y.o. female G2P1001 @ [redacted]w[redacted]d here in MAU with complaints of back pain. She was here yesterday with abdominal pain, the pain now radiates around to her lower back. She currently rates her pain 7/10 in her back. No bleeding. She is scheduled to go to the office on Friday for blood work. Her pain in her abdomen is no worse that it was yesterday. She is not taking medications for the pain.    OB History    Gravida  2   Para  1   Term  1   Preterm  0   AB  0   Living  1     SAB  0   TAB  0   Ectopic  0   Multiple  0   Live Births  1           Past Medical History:  Diagnosis Date  . Asthma    pt states she doesn't use inhaler much (04/10/19)  . Gonorrhea   . Heart murmur    when younger  . Vaginal Pap smear, abnormal     Past Surgical History:  Procedure Laterality Date  . CATARACT EXTRACTION    . WISDOM TOOTH EXTRACTION    . WRIST SURGERY      Family History  Problem Relation Age of Onset  . Hypertension Mother   . Miscarriages / [place] Mother   . Hypertension Maternal Grandmother   . Asthma Maternal Grandmother   . Diabetes Maternal Grandmother   . Vision loss Maternal Grandmother   . Mental illness Brother   . Cancer Maternal Aunt   . Heart disease Neg Hx     Social History   Tobacco Use  . Smoking status: Former Smoker    Packs/day: 0.25    Types: Cigars    Quit date: 03/11/2017    Years since quitting: 2.0  . Smokeless tobacco: Never Used  Substance Use Topics  . Alcohol use: No    Alcohol/week: 0.0 standard drinks    Comment: mimosa on saturday. 04/06/19  . Drug use: No    Allergies:  Allergies  Allergen Reactions  . Peach [Prunus Persica] Hives    No medications prior to admission.   Results for  orders placed or performed during the hospital encounter of 04/11/19 (from the past 48 hour(s))  Urinalysis, Routine w reflex microscopic     Status: Abnormal   Collection Time: 04/11/19 12:30 PM  Result Value Ref Range   Color, Urine YELLOW YELLOW   APPearance HAZY (A) CLEAR   Specific Gravity, Urine 1.028 1.005 - 1.030   pH 5.0 5.0 - 8.0   Glucose, UA NEGATIVE NEGATIVE mg/dL   Hgb urine dipstick NEGATIVE NEGATIVE   Bilirubin Urine NEGATIVE NEGATIVE   Ketones, ur NEGATIVE NEGATIVE mg/dL   Protein, ur NEGATIVE NEGATIVE mg/dL   Nitrite NEGATIVE NEGATIVE   Leukocytes,Ua NEGATIVE NEGATIVE    Comment: Performed at [facility] [address] [place], [place] [address]  hCG, quantitative, pregnancy     Status: Abnormal   Collection Time: 04/11/19  1:05 PM  Result Value Ref Range   hCG, Beta Chain, Quant, S 770 (H) <5 mIU/mL    Comment:  GEST. AGE      CONC.  (mIU/mL)   <=1 WEEK        5 - 50     2 WEEKS       50 - 500     3 WEEKS       100 - 10,000     4 WEEKS     1,000 - 30,000     5 WEEKS     3,500 - 115,000   6-8 WEEKS     12,000 - 270,000    12 WEEKS     15,000 - 220,000        FEMALE AND NON-PREGNANT FEMALE:     LESS THAN 5 mIU/mL Performed at [facility], [address] [place], [place] [address]    Review of Systems  Constitutional: Negative for fever.  Gastrointestinal: Negative for abdominal pain, diarrhea and vomiting.  Genitourinary: Positive for dysuria. Negative for vaginal bleeding.   Physical Exam   Blood pressure (!) 106/58, pulse 76, temperature 98.4 F (36.9 C), temperature source Oral, resp. rate 18, height 5\' 3"  (1.6 m), weight 82.4 kg, last menstrual period 03/06/2019, SpO2 100 %, not currently breastfeeding.  Physical Exam  Constitutional: She is oriented to person, place, and time. She appears well-developed and well-nourished. No distress.  GI: Soft. She exhibits no distension and no mass. There is no abdominal tenderness. There  is no rebound, no guarding and no CVA tenderness.  Musculoskeletal: Normal range of motion.  Neurological: She is alert and oriented to person, place, and time.  Skin: She is not diaphoretic.   MAU Course  Procedures None  MDM  Hcg increased from yesterday's quant 566--770  Assessment and Plan   A:  1. Back pain affecting pregnancy in first trimester   2. [redacted] weeks gestation of pregnancy     P:  Discharge home with strict return precautions Ectopic precautions Pelvic rest Return to MAU if symptoms worsen Go to the office on Friday as scheduled for repeat Loni MuseQuant   Rasch, Harolyn RutherfordJennifer I, NP 04/11/2019 6:29 PM

## 2019-04-13 ENCOUNTER — Ambulatory Visit (INDEPENDENT_AMBULATORY_CARE_PROVIDER_SITE_OTHER): Payer: Medicaid Other | Admitting: Emergency Medicine

## 2019-04-13 ENCOUNTER — Other Ambulatory Visit: Payer: Self-pay

## 2019-04-13 ENCOUNTER — Inpatient Hospital Stay (HOSPITAL_COMMUNITY)
Admission: AD | Admit: 2019-04-13 | Discharge: 2019-04-13 | Disposition: A | Discharge status: Home / Self Care | Payer: Medicaid Other | Attending: Obstetrics and Gynecology | Admitting: Obstetrics and Gynecology

## 2019-04-13 DIAGNOSIS — O3680X0 Pregnancy with inconclusive fetal viability, not applicable or unspecified: Secondary | ICD-10-CM | POA: Diagnosis not present

## 2019-04-13 DIAGNOSIS — M545 Low back pain, unspecified: Secondary | ICD-10-CM

## 2019-04-13 DIAGNOSIS — Z87891 Personal history of nicotine dependence: Secondary | ICD-10-CM | POA: Diagnosis not present

## 2019-04-13 DIAGNOSIS — Z3A01 Less than 8 weeks gestation of pregnancy: Secondary | ICD-10-CM | POA: Insufficient documentation

## 2019-04-13 DIAGNOSIS — Z3490 Encounter for supervision of normal pregnancy, unspecified, unspecified trimester: Secondary | ICD-10-CM

## 2019-04-13 DIAGNOSIS — O26891 Other specified pregnancy related conditions, first trimester: Secondary | ICD-10-CM | POA: Diagnosis not present

## 2019-04-13 LAB — BETA HCG QUANT (REF LAB): hCG Quant: 1858 m[IU]/mL

## 2019-04-13 LAB — CULTURE, OB URINE: Culture: 20000 — AB

## 2019-04-13 NOTE — MAU Note (Signed)
Had blood work today,  Hormone level at 1800.  Is still having pain, back is hurting worse then it was. No bleeding.  Is feeling stressed, worried about possible ectopic, isn't eating.

## 2019-04-13 NOTE — Progress Notes (Signed)
Pt here today for stat beta lab draw related to MAU visit on 6/17. Pt denies any pain or bleeding. Pt advised that it will take approx. 2 hours for the results to come back and to be expecting a phone call around that time. Pt verbalized understanding and had no further questions.    Received critical lab result from [facility]. Stat beta levels today are 1,858. Dr. Harolyn Rutherford notified and recommended a ultrasound in 10-14 days to determine unknown location of pregnancy. Notified pt of provider's recommendations and informed her of ultrasound scheduled for 6/30 @ 8am. Pt verbalized understanding and had no further questions.

## 2019-04-13 NOTE — MAU Provider Note (Signed)
History     CSN: [id number]  Arrival date and time: 04/13/19 1533  No chief complaint on file.  G2P1001 @unknown  gestation here with concerns that she may have an ectopic pregnancy and LBP. She was seen 2 days ago in MAU for abd and back pain and US showed no IUP, qHCG was 770. She was seen in the [facility] today and f/u qHCG was 1858. She was still concerned that she may have an ectopic and did not quite understand what the nurse told her. Review of the nurses note states that rise in quant was appropriate and would need repeat US in 10-14 days. LBP is bilateral. Denies lifting, pushing, and pulling. No urinary sx. No VB or abd pain. She was given Rx for Flexeril but hasn't picked it up yet.   OB History    Gravida  2   Para  1   Term  1   Preterm  0   AB  0   Living  1     SAB  0   TAB  0   Ectopic  0   Multiple  0   Live Births  1           Past Medical History:  Diagnosis Date  . Asthma    pt states she doesn't use inhaler much (04/10/19)  . Gonorrhea   . Heart murmur    when younger  . Vaginal Pap smear, abnormal     Past Surgical History:  Procedure Laterality Date  . CATARACT EXTRACTION    . WISDOM TOOTH EXTRACTION    . WRIST SURGERY      Family History  Problem Relation Age of Onset  . Hypertension Mother   . Miscarriages / [place] Mother   . Hypertension Maternal Grandmother   . Asthma Maternal Grandmother   . Diabetes Maternal Grandmother   . Vision loss Maternal Grandmother   . Mental illness Brother   . Cancer Maternal Aunt   . Heart disease Neg Hx     Social History   Tobacco Use  . Smoking status: Former Smoker    Packs/day: 0.25    Types: Cigars    Quit date: 03/11/2017    Years since quitting: 2.0  . Smokeless tobacco: Never Used  Substance Use Topics  . Alcohol use: No    Alcohol/week: 0.0 standard drinks    Comment: mimosa on saturday. 04/06/19  . Drug use: No    Allergies:  Allergies  Allergen Reactions  . Peach  [Prunus Persica] Hives    Medications Prior to Admission  Medication Sig Dispense Refill Last Dose  . cyclobenzaprine (FLEXERIL) 10 MG tablet Take 1 tablet (10 mg total) by mouth 3 (three) times daily as needed for muscle spasms. 30 tablet 0   . metoCLOPramide (REGLAN) 10 MG tablet Take 1 tablet (10 mg total) by mouth 4 (four) times daily -  before meals and at bedtime. 90 tablet 1   . Prenat w/o A-FeCbn-Meth-FA-DHA (PRENATE MINI) 29-0.6-0.4-350 MG CAPS Take 1 capsule by mouth daily before breakfast. (Patient not taking: Reported on 02/28/2019) 30 capsule 11     Review of Systems  Constitutional: Negative for fever.  Gastrointestinal: Negative for abdominal pain.  Genitourinary: Negative for dysuria and vaginal bleeding.  Musculoskeletal: Positive for back pain.   Physical Exam   Blood pressure 113/62, pulse 79, temperature 98.7 F (37.1 C), temperature source Oral, resp. rate 18, height 5\' 3"  (1.6 m), weight 81.6 kg, last menstrual period 03/06/2019,  SpO2 100 %, not currently breastfeeding.  Physical Exam  Nursing note and vitals reviewed. Constitutional: She is oriented to person, place, and time. She appears well-developed and well-nourished. No distress.  HENT:  Head: Normocephalic and atraumatic.  Neck: Normal range of motion.  Cardiovascular: Normal rate.  Respiratory: Effort normal. No respiratory distress.  Musculoskeletal: Normal range of motion.  Neurological: She is alert and oriented to person, place, and time.  Psychiatric: She has a normal mood and affect.   Results for orders placed or performed in visit on 04/13/19 (from the past 24 hour(s))  Beta hCG quant (ref lab)     Status: None   Collection Time: 04/13/19  9:04 AM  Result Value Ref Range   hCG Quant 1,858 mIU/mL   Narrative   Performed at:  01 - [facility] [address]  [address], [place], [place]  [id number] Lab Director: Mila HomerWilliam F Hancock MD, Phone:  [phone]   MAU Course   Procedures  MDM UA from 2 days ago normal. LBP likely MSK, can be managed with heat, Tylenol, and Flexeril. Pt reassured that she had good rise in [facility] and next steps would be repeat US in 10-14 days, ectopic very unlikely. She verbalizes understanding. Stable for discharge home.   Assessment and Plan   1. Early stage of pregnancy   2. Acute midline low back pain without sciatica    [facility] as [facility] SAB precautions  Allergies as of 04/13/2019      Reactions   Peach [prunus Persica] Hives      Medication List    TAKE these medications   cyclobenzaprine 10 MG tablet Commonly known as: FLEXERIL Take 1 tablet (10 mg total) by mouth 3 (three) times daily as needed for muscle spasms.   metoCLOPramide 10 MG tablet Commonly known as: REGLAN Take 1 tablet (10 mg total) by mouth 4 (four) times daily -  before meals and at bedtime.   Prenate Mini 29-0.6-0.4-350 MG Caps Take 1 capsule by mouth daily before breakfast.      Donette LarryMelanie Jaqwan Wieber, CNM 04/13/2019, 5:40 PM

## 2019-04-14 ENCOUNTER — Telehealth: Payer: Self-pay | Admitting: Women's Health

## 2019-04-14 MED ORDER — CEPHALEXIN 500 MG PO CAPS: 500.0000 mg | ORAL_CAPSULE | Freq: Four times a day (QID) | ORAL | 0 refills | Status: AC

## 2019-04-14 NOTE — Telephone Encounter (Signed)
Called and spoke with pt regarding UTI. Pt identified by two identifiers. Per Dr. Ilda Basset, will send Keflex 500mg  QID x3days. Pt states she has no allergies to medications and is only taking PNVs. Pt verbalizes understanding and agrees with plan. Pt questions asked and answered. Pt aware to f/u with OB for UTI TOC after completion of ABX. Discussed s/sx of persistent UTI and when to return to MAU/OB provider for further evaluation and treatment.  Clarisa Fling, NP  9:40 AM 04/14/2019

## 2019-04-15 NOTE — Progress Notes (Signed)
I have reviewed the chart and agree with nursing staff's documentation of this patient's encounter.  Verita Schneiders, MD 04/15/2019 1:17 PM

## 2019-04-20 ENCOUNTER — Other Ambulatory Visit: Payer: Self-pay

## 2019-04-20 ENCOUNTER — Encounter (HOSPITAL_COMMUNITY): Payer: Self-pay

## 2019-04-20 ENCOUNTER — Inpatient Hospital Stay (HOSPITAL_COMMUNITY)
Admission: AD | Admit: 2019-04-20 | Discharge: 2019-04-20 | Disposition: A | Discharge status: Home / Self Care | Payer: Medicaid Other | Attending: Obstetrics and Gynecology | Admitting: Obstetrics and Gynecology

## 2019-04-20 ENCOUNTER — Inpatient Hospital Stay (HOSPITAL_COMMUNITY): Payer: Medicaid Other

## 2019-04-20 DIAGNOSIS — O26891 Other specified pregnancy related conditions, first trimester: Secondary | ICD-10-CM | POA: Diagnosis not present

## 2019-04-20 DIAGNOSIS — Z3A01 Less than 8 weeks gestation of pregnancy: Secondary | ICD-10-CM | POA: Insufficient documentation

## 2019-04-20 DIAGNOSIS — R109 Unspecified abdominal pain: Secondary | ICD-10-CM | POA: Diagnosis not present

## 2019-04-20 DIAGNOSIS — R102 Pelvic and perineal pain: Secondary | ICD-10-CM | POA: Diagnosis not present

## 2019-04-20 DIAGNOSIS — Z87891 Personal history of nicotine dependence: Secondary | ICD-10-CM | POA: Diagnosis not present

## 2019-04-20 DIAGNOSIS — Z3491 Encounter for supervision of normal pregnancy, unspecified, first trimester: Secondary | ICD-10-CM

## 2019-04-20 LAB — URINALYSIS, ROUTINE W REFLEX MICROSCOPIC
Bilirubin Urine: NEGATIVE
Glucose, UA: NEGATIVE mg/dL
Hgb urine dipstick: NEGATIVE
Ketones, ur: NEGATIVE mg/dL
Leukocytes,Ua: NEGATIVE
Nitrite: NEGATIVE
Protein, ur: NEGATIVE mg/dL
Specific Gravity, Urine: 1.013 (ref 1.005–1.030)
pH: 7 (ref 5.0–8.0)

## 2019-04-20 LAB — CBC
HCT: 37 % (ref 36.0–46.0)
Hemoglobin: 12.1 g/dL (ref 12.0–15.0)
MCH: 27.2 pg (ref 26.0–34.0)
MCHC: 32.7 g/dL (ref 30.0–36.0)
MCV: 83.1 fL (ref 80.0–100.0)
Platelets: 193 10*3/uL (ref 150–400)
RBC: 4.45 MIL/uL (ref 3.87–5.11)
RDW: 12.8 % (ref 11.5–15.5)
WBC: 3.6 10*3/uL — ABNORMAL LOW (ref 4.0–10.5)
nRBC: 0 % (ref 0.0–0.2)

## 2019-04-20 LAB — HCG, QUANTITATIVE, PREGNANCY: hCG, Beta Chain, Quant, S: 25645 m[IU]/mL — ABNORMAL HIGH (ref <5)

## 2019-04-20 NOTE — Discharge Instructions (Signed)
Return to care  °· If you have heavier bleeding that soaks through more that 2 pads per hour for an hour or more °· If you bleed so much that you feel like you might pass out or you do pass out °· If you have significant abdominal pain that is not improved with Tylenol  °· If you develop a fever > 100.5 ° °

## 2019-04-20 NOTE — MAU Note (Signed)
Still having sharp pains in lower stomach.  Comes and goes.  No bleeding.  Did pick up antibiotic for UTI.

## 2019-04-20 NOTE — MAU Provider Note (Signed)
Chief Complaint: Abdominal Pain   First Provider Initiated Contact with Patient 04/20/19 1148     SUBJECTIVE HPI: Andrea Jenkins is a 23 y.o. G2P1001 at 6422w3d who presents to [facility]missions reporting abdominal pain. Symptoms started yesterday. Reports intermittent sharp pains in her abdomen. Was recently treated for a UTI, completed her abx 3 days ago. Denies n/v/d, constipation, dysuria, or vaginal bleeding.   Location: abdomen Quality: sharp Severity: 6/10 on pain scale Duration: 1 day Timing: intermittent Modifying factors: none Associated signs and symptoms: none  Past Medical History:  Diagnosis Date  . Asthma    pt states she doesn't use inhaler much (04/10/19)  . Gonorrhea   . Heart murmur    when younger  . Vaginal Pap smear, abnormal    OB History  Gravida Para Term Preterm AB Living  2 1 1  0 0 1  SAB TAB Ectopic Multiple Live Births  0 0 0 0 1    # Outcome Date GA Lbr Len/2nd Weight Sex Delivery Anes PTL Lv  2 Current           1 Term 01/18/18 5860w6d 07:57 / 00:18 3365 g F Vag-Spont EPI  LIV   Past Surgical History:  Procedure Laterality Date  . CATARACT EXTRACTION    . WISDOM TOOTH EXTRACTION    . WRIST SURGERY     Social History   Socioeconomic History  . Marital status: Single    Spouse name: Not on file  . Number of children: Not on file  . Years of education: Not on file  . Highest education level: Not on file  Occupational History  . Not on file  Social Needs  . Financial resource strain: Not on file  . Food insecurity    Worry: Not on file    Inability: Not on file  . Transportation needs    Medical: Not on file    Non-medical: Not on file  Tobacco Use  . Smoking status: Former Smoker    Packs/day: 0.25    Types: Cigars    Quit date: 03/11/2017    Years since quitting: 2.1  . Smokeless tobacco: Never Used  Substance and Sexual Activity  . Alcohol use: No    Alcohol/week: 0.0 standard drinks    Comment: mimosa on saturday.  04/06/19  . Drug use: No  . Sexual activity: Yes    Partners: Male    Birth control/protection: None  Lifestyle  . Physical activity    Days per week: Not on file    Minutes per session: Not on file  . Stress: Not on file  Relationships  . Social Musicianconnections    Talks on phone: Not on file    Gets together: Not on file    Attends religious service: Not on file    Active member of club or organization: Not on file    Attends meetings of clubs or organizations: Not on file    Relationship status: Not on file  . Intimate partner violence    Fear of current or ex partner: Not on file    Emotionally abused: Not on file    Physically abused: Not on file    Forced sexual activity: Not on file  Other Topics Concern  . Not on file  Social History Narrative  . Not on file   Family History  Problem Relation Age of Onset  . Hypertension Mother   . Miscarriages / [place] Mother   . Hypertension Maternal Grandmother   .  Asthma Maternal Grandmother   . Diabetes Maternal Grandmother   . Vision loss Maternal Grandmother   . Mental illness Brother   . Cancer Maternal Aunt   . Heart disease Neg Hx    No current facility-administered medications on file prior to encounter.    Current Outpatient Medications on File Prior to Encounter  Medication Sig Dispense Refill  . cyclobenzaprine (FLEXERIL) 10 MG tablet Take 1 tablet (10 mg total) by mouth 3 (three) times daily as needed for muscle spasms. 30 tablet 0  . metoCLOPramide (REGLAN) 10 MG tablet Take 1 tablet (10 mg total) by mouth 4 (four) times daily -  before meals and at bedtime. 90 tablet 1  . Prenat w/o A-FeCbn-Meth-FA-DHA (PRENATE MINI) 29-0.6-0.4-350 MG CAPS Take 1 capsule by mouth daily before breakfast. (Patient not taking: Reported on 02/28/2019) 30 capsule 11   Allergies  Allergen Reactions  . Peach [Prunus Persica] Hives    I have reviewed patient's Past Medical Hx, Surgical Hx, Family Hx, Social Hx, medications and  allergies.   Review of Systems  Constitutional: Negative.   Gastrointestinal: Positive for abdominal pain. Negative for constipation, diarrhea, nausea and vomiting.  Genitourinary: Negative.     OBJECTIVE Patient Vitals for the past 24 hrs:  BP Temp Temp src Pulse Resp SpO2 Weight  04/20/19 1327 123/73 - - 63 18 - -  04/20/19 1112 109/62 98.1 F (36.7 C) Oral 77 16 100 % 81.8 kg   Constitutional: Well-developed, well-nourished female in no acute distress.  Cardiovascular: normal rate & rhythm, no murmur Respiratory: normal rate and effort. Lung sounds clear throughout GI: Abd soft, non-tender, Pos BS x 4. No guarding or rebound tenderness MS: Extremities nontender, no edema, normal ROM Neurologic: Alert and oriented x 4.     LAB RESULTS Results for orders placed or performed during the hospital encounter of 04/20/19 (from the past 24 hour(s))  Urinalysis, Routine w reflex microscopic     Status: Abnormal   Collection Time: 04/20/19 11:36 AM  Result Value Ref Range   Color, Urine YELLOW YELLOW   APPearance HAZY (A) CLEAR   Specific Gravity, Urine 1.013 1.005 - 1.030   pH 7.0 5.0 - 8.0   Glucose, UA NEGATIVE NEGATIVE mg/dL   Hgb urine dipstick NEGATIVE NEGATIVE   Bilirubin Urine NEGATIVE NEGATIVE   Ketones, ur NEGATIVE NEGATIVE mg/dL   Protein, ur NEGATIVE NEGATIVE mg/dL   Nitrite NEGATIVE NEGATIVE   Leukocytes,Ua NEGATIVE NEGATIVE  CBC     Status: Abnormal   Collection Time: 04/20/19 12:13 PM  Result Value Ref Range   WBC 3.6 (L) 4.0 - 10.5 K/uL   RBC 4.45 3.87 - 5.11 MIL/uL   Hemoglobin 12.1 12.0 - 15.0 g/dL   HCT 16.137.0 09.636.0 - 04.546.0 %   MCV 83.1 80.0 - 100.0 fL   MCH 27.2 26.0 - 34.0 pg   MCHC 32.7 30.0 - 36.0 g/dL   RDW 40.912.8 81.111.5 - 91.415.5 %   Platelets 193 150 - 400 K/uL   nRBC 0.0 0.0 - 0.2 %  hCG, quantitative, pregnancy     Status: Abnormal   Collection Time: 04/20/19 12:13 PM  Result Value Ref Range   hCG, Beta Chain, Quant, S 25,645 (H) <5 mIU/mL     IMAGING [place] Ob Transvaginal  Result Date: 04/20/2019 CLINICAL DATA:  Abdominal pain during pregnancy. EXAM: TRANSVAGINAL OB ULTRASOUND TECHNIQUE: Transvaginal ultrasound was performed for complete evaluation of the gestation as well as the maternal uterus, adnexal regions, and pelvic cul-de-sac.  COMPARISON:  Ultrasound of April 10, 2019. FINDINGS: Intrauterine gestational sac: Single visualized. Yolk sac:  Visualized. Embryo:  Visualized. Cardiac Activity: Visualized. Heart Rate: 106 bpm CRL:   2.0 mm   5 w 5 d                  [place] EDC: December 16, 2019. Subchorionic hemorrhage:  None visualized. Maternal uterus/adnexae: Probable corpus luteum cyst seen in left ovary. Right ovary is unremarkable. No free fluid is noted. IMPRESSION: Single live intrauterine gestation of 5 weeks 5 days. Electronically Signed   By: Marijo Conception M.D.   On: 04/20/2019 12:54    MAU COURSE Orders Placed This Encounter  Procedures  . US OB Transvaginal  . Urinalysis, Routine w reflex microscopic  . CBC  . hCG, quantitative, pregnancy  . Discharge patient   No orders of the defined types were placed in this encounter.   MDM Ultrasound shows live IUP U/a negative for signs of infection  ASSESSMENT 1. Normal IUP (intrauterine pregnancy) on prenatal ultrasound, first trimester   2. Abdominal pain during pregnancy in first trimester     PLAN Discharge home in stable condition. SAB precautions [place] [facility] Follow up.   Specialty: Obstetrics and Gynecology Why: for worsening symptoms Contact information: [address], [place] [id number] [place] [id number] [phone]         Allergies as of 04/20/2019      Reactions   Peach [prunus Persica] Hives      Medication List    TAKE these medications   cyclobenzaprine 10 MG tablet Commonly known as: FLEXERIL Take 1 tablet (10 mg total) by mouth 3  (three) times daily as needed for muscle spasms.   metoCLOPramide 10 MG tablet Commonly known as: REGLAN Take 1 tablet (10 mg total) by mouth 4 (four) times daily -  before meals and at bedtime.   Prenate Mini 29-0.6-0.4-350 MG Caps Take 1 capsule by mouth daily before breakfast.        Jorje Guild, NP 04/20/2019  4:02 PM

## 2019-04-24 ENCOUNTER — Ambulatory Visit (HOSPITAL_COMMUNITY): Payer: Medicaid Other

## 2019-04-27 ENCOUNTER — Encounter (HOSPITAL_COMMUNITY): Payer: Self-pay | Admitting: *Deleted

## 2019-04-27 ENCOUNTER — Inpatient Hospital Stay (HOSPITAL_COMMUNITY)
Admission: AD | Admit: 2019-04-27 | Discharge: 2019-04-27 | Disposition: A | Discharge status: Home / Self Care | Payer: Medicaid Other | Attending: Family Medicine | Admitting: Family Medicine

## 2019-04-27 ENCOUNTER — Other Ambulatory Visit: Payer: Self-pay

## 2019-04-27 DIAGNOSIS — Z3A01 Less than 8 weeks gestation of pregnancy: Secondary | ICD-10-CM | POA: Diagnosis not present

## 2019-04-27 DIAGNOSIS — R102 Pelvic and perineal pain: Secondary | ICD-10-CM

## 2019-04-27 DIAGNOSIS — N949 Unspecified condition associated with female genital organs and menstrual cycle: Secondary | ICD-10-CM

## 2019-04-27 DIAGNOSIS — O26891 Other specified pregnancy related conditions, first trimester: Secondary | ICD-10-CM | POA: Diagnosis not present

## 2019-04-27 DIAGNOSIS — R1030 Lower abdominal pain, unspecified: Secondary | ICD-10-CM | POA: Diagnosis not present

## 2019-04-27 LAB — WET PREP, GENITAL
Clue Cells Wet Prep HPF POC: NONE SEEN
Sperm: NONE SEEN
Trich, Wet Prep: NONE SEEN
Yeast Wet Prep HPF POC: NONE SEEN

## 2019-04-27 LAB — URINALYSIS, ROUTINE W REFLEX MICROSCOPIC
Bilirubin Urine: NEGATIVE
Glucose, UA: NEGATIVE mg/dL
Hgb urine dipstick: NEGATIVE
Ketones, ur: NEGATIVE mg/dL
Leukocytes,Ua: NEGATIVE
Nitrite: NEGATIVE
Protein, ur: NEGATIVE mg/dL
Specific Gravity, Urine: 1.025 (ref 1.005–1.030)
pH: 5 (ref 5.0–8.0)

## 2019-04-27 NOTE — MAU Note (Signed)
Having like even worse stomach pains than she had before.  'feels like period cramps'.  No bleeding.

## 2019-04-27 NOTE — Discharge Instructions (Signed)
First Trimester of Pregnancy The first trimester of pregnancy is from week 1 until the end of week 13 (months 1 through 3). A week after a sperm fertilizes an egg, the egg will implant on the wall of the uterus. This embryo will begin to develop into a baby. Genes from you and your partner will form the baby. The female genes will determine whether the baby will be a boy or a girl. At 6-8 weeks, the eyes and face will be formed, and the heartbeat can be seen on ultrasound. At the end of 12 weeks, all the baby's organs will be formed. Now that you are pregnant, you will want to do everything you can to have a healthy baby. Two of the most important things are to get good prenatal care and to follow your health care provider's instructions. Prenatal care is all the medical care you receive before the baby's birth. This care will help prevent, find, and treat any problems during the pregnancy and childbirth. Body changes during your first trimester Your body goes through many changes during pregnancy. The changes vary from woman to woman.  You may gain or lose a couple of pounds at first.  You may feel sick to your stomach (nauseous) and you may throw up (vomit). If the vomiting is uncontrollable, call your health care provider.  You may tire easily.  You may develop headaches that can be relieved by medicines. All medicines should be approved by your health care provider.  You may urinate more often. Painful urination may mean you have a bladder infection.  You may develop heartburn as a result of your pregnancy.  You may develop constipation because certain hormones are causing the muscles that push stool through your intestines to slow down.  You may develop hemorrhoids or swollen veins (varicose veins).  Your breasts may begin to grow larger and become tender. Your nipples may stick out more, and the tissue that surrounds them (areola) may become darker.  Your gums may bleed and may be  sensitive to brushing and flossing.  Dark spots or blotches (chloasma, mask of pregnancy) may develop on your face. This will likely fade after the baby is born.  Your menstrual periods will stop.  You may have a loss of appetite.  You may develop cravings for certain kinds of food.  You may have changes in your emotions from day to day, such as being excited to be pregnant or being concerned that something may go wrong with the pregnancy and baby.  You may have more vivid and strange dreams.  You may have changes in your hair. These can include thickening of your hair, rapid growth, and changes in texture. Some women also have hair loss during or after pregnancy, or hair that feels dry or thin. Your hair will most likely return to normal after your baby is born. What to expect at prenatal visits During a routine prenatal visit:  You will be weighed to make sure you and the baby are growing normally.  Your blood pressure will be taken.  Your abdomen will be measured to track your baby's growth.  The fetal heartbeat will be listened to between weeks 10 and 14 of your pregnancy.  Test results from any previous visits will be discussed. Your health care provider may ask you:  How you are feeling.  If you are feeling the baby move.  If you have had any abnormal symptoms, such as leaking fluid, bleeding, severe headaches, or abdominal   cramping.  If you are using any tobacco products, including cigarettes, chewing tobacco, and electronic cigarettes.  If you have any questions. Other tests that may be performed during your first trimester include:  Blood tests to find your blood type and to check for the presence of any previous infections. The tests will also be used to check for low iron levels (anemia) and protein on red blood cells (Rh antibodies). Depending on your risk factors, or if you previously had diabetes during pregnancy, you may have tests to check for high blood sugar  that affects pregnant women (gestational diabetes).  Urine tests to check for infections, diabetes, or protein in the urine.  An ultrasound to confirm the proper growth and development of the baby.  Fetal screens for spinal cord problems (spina bifida) and Down syndrome.  HIV (human immunodeficiency virus) testing. Routine prenatal testing includes screening for HIV, unless you choose not to have this test.  You may need other tests to make sure you and the baby are doing well. Follow these instructions at home: Medicines  Follow your health care provider's instructions regarding medicine use. Specific medicines may be either safe or unsafe to take during pregnancy.  Take a prenatal vitamin that contains at least 600 micrograms (mcg) of folic acid.  If you develop constipation, try taking a stool softener if your health care provider approves. Eating and drinking   Eat a balanced diet that includes fresh fruits and vegetables, whole grains, good sources of protein such as meat, eggs, or tofu, and low-fat dairy. Your health care provider will help you determine the amount of weight gain that is right for you.  Avoid raw meat and uncooked cheese. These carry germs that can cause birth defects in the baby.  Eating four or five small meals rather than three large meals a day may help relieve nausea and vomiting. If you start to feel nauseous, eating a few soda crackers can be helpful. Drinking liquids between meals, instead of during meals, also seems to help ease nausea and vomiting.  Limit foods that are high in fat and processed sugars, such as fried and sweet foods.  To prevent constipation: ? Eat foods that are high in fiber, such as fresh fruits and vegetables, whole grains, and beans. ? Drink enough fluid to keep your urine clear or pale yellow. Activity  Exercise only as directed by your health care provider. Most women can continue their usual exercise routine during  pregnancy. Try to exercise for 30 minutes at least 5 days a week. Exercising will help you: ? Control your weight. ? Stay in shape. ? Be prepared for labor and delivery.  Experiencing pain or cramping in the lower abdomen or lower back is a good sign that you should stop exercising. Check with your health care provider before continuing with normal exercises.  Try to avoid standing for long periods of time. Move your legs often if you must stand in one place for a long time.  Avoid heavy lifting.  Wear low-heeled shoes and practice good posture.  You may continue to have sex unless your health care provider tells you not to. Relieving pain and discomfort  Wear a good support bra to relieve breast tenderness.  Take warm sitz baths to soothe any pain or discomfort caused by hemorrhoids. Use hemorrhoid cream if your health care provider approves.  Rest with your legs elevated if you have leg cramps or low back pain.  If you develop varicose veins in   your legs, wear support hose. Elevate your feet for 15 minutes, 3-4 times a day. Limit salt in your diet. Prenatal care  Schedule your prenatal visits by the twelfth week of pregnancy. They are usually scheduled monthly at first, then more often in the last 2 months before delivery.  Write down your questions. Take them to your prenatal visits.  Keep all your prenatal visits as told by your health care provider. This is important. Safety  Wear your seat belt at all times when driving.  Make a list of emergency phone numbers, including numbers for family, friends, the hospital, and police and fire departments. General instructions  Ask your health care provider for a referral to a local prenatal education class. Begin classes no later than the beginning of month 6 of your pregnancy.  Ask for help if you have counseling or nutritional needs during pregnancy. Your health care provider can offer advice or refer you to specialists for help  with various needs.  Do not use hot tubs, steam rooms, or saunas.  Do not douche or use tampons or scented sanitary pads.  Do not cross your legs for long periods of time.  Avoid cat litter boxes and soil used by cats. These carry germs that can cause birth defects in the baby and possibly loss of the fetus by miscarriage or stillbirth.  Avoid all smoking, herbs, alcohol, and medicines not prescribed by your health care provider. Chemicals in these products affect the formation and growth of the baby.  Do not use any products that contain nicotine or tobacco, such as cigarettes and e-cigarettes. If you need help quitting, ask your health care provider. You may receive counseling support and other resources to help you quit.  Schedule a dentist appointment. At home, brush your teeth with a soft toothbrush and be gentle when you floss. Contact a health care provider if:  You have dizziness.  You have mild pelvic cramps, pelvic pressure, or nagging pain in the abdominal area.  You have persistent nausea, vomiting, or diarrhea.  You have a bad smelling vaginal discharge.  You have pain when you urinate.  You notice increased swelling in your face, hands, legs, or ankles.  You are exposed to fifth disease or chickenpox.  You are exposed to German measles (rubella) and have never had it. Get help right away if:  You have a fever.  You are leaking fluid from your vagina.  You have spotting or bleeding from your vagina.  You have severe abdominal cramping or pain.  You have rapid weight gain or loss.  You vomit blood or material that looks like coffee grounds.  You develop a severe headache.  You have shortness of breath.  You have any kind of trauma, such as from a fall or a car accident. Summary  The first trimester of pregnancy is from week 1 until the end of week 13 (months 1 through 3).  Your body goes through many changes during pregnancy. The changes vary from  woman to woman.  You will have routine prenatal visits. During those visits, your health care provider will examine you, discuss any test results you may have, and talk with you about how you are feeling. This information is not intended to replace advice given to you by your health care provider. Make sure you discuss any questions you have with your health care provider. Document Released: 10/05/2001 Document Revised: 09/23/2017 Document Reviewed: 09/22/2016 [employer]vier Patient Education  2020 [employer]  

## 2019-04-27 NOTE — MAU Provider Note (Signed)
History     CSN: [id number]  Arrival date and time: 04/27/19 1546   First Provider Initiated Contact with Patient 04/27/19 1631      Chief Complaint  Patient presents with  . Abdominal Pain   Andrea Jenkins is a 23 y.o. G2P1001 at 7524w3d who presents today with lower abdominal pain. This has been an ongoing problem for her, and she has had multiple MAU visits for this same pain. IUP has been confirmed.   Pelvic Pain The patient's primary symptoms include pelvic pain. The patient's pertinent negatives include no vaginal discharge. This is a new problem. The current episode started in the past 7 days. The problem occurs constantly. The problem has been unchanged. The problem affects both sides. She is pregnant. Pertinent negatives include no chills, dysuria, fever, frequency, nausea or vomiting. The vaginal discharge was normal. There has been no bleeding. Nothing aggravates the symptoms. She has tried acetaminophen for the symptoms. The treatment provided no relief.    OB History    Gravida  2   Para  1   Term  1   Preterm  0   AB  0   Living  1     SAB  0   TAB  0   Ectopic  0   Multiple  0   Live Births  1           Past Medical History:  Diagnosis Date  . Asthma    pt states she doesn't use inhaler much (04/10/19)  . Gonorrhea   . Heart murmur    when younger  . Vaginal Pap smear, abnormal     Past Surgical History:  Procedure Laterality Date  . CATARACT EXTRACTION    . WISDOM TOOTH EXTRACTION    . WRIST SURGERY      Family History  Problem Relation Age of Onset  . Hypertension Mother   . Miscarriages / [place] Mother   . Hypertension Maternal Grandmother   . Asthma Maternal Grandmother   . Diabetes Maternal Grandmother   . Vision loss Maternal Grandmother   . Mental illness Brother   . Cancer Maternal Aunt   . Heart disease Neg Hx     Social History   Tobacco Use  . Smoking status: Former Smoker    Packs/day: 0.25    Types:  Cigars    Quit date: 03/11/2017    Years since quitting: 2.1  . Smokeless tobacco: Never Used  Substance Use Topics  . Alcohol use: No    Alcohol/week: 0.0 standard drinks    Comment: mimosa on saturday. 04/06/19  . Drug use: No    Allergies:  Allergies  Allergen Reactions  . Peach [Prunus Persica] Hives    Medications Prior to Admission  Medication Sig Dispense Refill Last Dose  . cyclobenzaprine (FLEXERIL) 10 MG tablet Take 1 tablet (10 mg total) by mouth 3 (three) times daily as needed for muscle spasms. 30 tablet 0   . metoCLOPramide (REGLAN) 10 MG tablet Take 1 tablet (10 mg total) by mouth 4 (four) times daily -  before meals and at bedtime. 90 tablet 1   . Prenat w/o A-FeCbn-Meth-FA-DHA (PRENATE MINI) 29-0.6-0.4-350 MG CAPS Take 1 capsule by mouth daily before breakfast. (Patient not taking: Reported on 02/28/2019) 30 capsule 11     Review of Systems  Constitutional: Negative for chills and fever.  Gastrointestinal: Negative for nausea and vomiting.  Genitourinary: Positive for pelvic pain. Negative for dysuria, frequency, vaginal bleeding and  vaginal discharge.   Physical Exam   Blood pressure (!) 122/59, pulse 84, temperature 98.7 F (37.1 C), temperature source Oral, resp. rate 16, weight 81.6 kg, last menstrual period 03/06/2019, SpO2 100 %, not currently breastfeeding.  Physical Exam  Nursing note and vitals reviewed. Constitutional: She is oriented to person, place, and time. She appears well-developed and well-nourished. No distress.  HENT:  Head: Normocephalic.  Cardiovascular: Normal rate.  Respiratory: Effort normal.  GI: Soft. There is no abdominal tenderness. There is no rebound.  Neurological: She is alert and oriented to person, place, and time.  Skin: Skin is warm and dry.  Psychiatric: She has a normal mood and affect.   Results for orders placed or performed during the hospital encounter of 04/27/19 (from the past 24 hour(s))  Urinalysis, Routine w  reflex microscopic     Status: Abnormal   Collection Time: 04/27/19  4:11 PM  Result Value Ref Range   Color, Urine AMBER (A) YELLOW   APPearance HAZY (A) CLEAR   Specific Gravity, Urine 1.025 1.005 - 1.030   pH 5.0 5.0 - 8.0   Glucose, UA NEGATIVE NEGATIVE mg/dL   Hgb urine dipstick NEGATIVE NEGATIVE   Bilirubin Urine NEGATIVE NEGATIVE   Ketones, ur NEGATIVE NEGATIVE mg/dL   Protein, ur NEGATIVE NEGATIVE mg/dL   Nitrite NEGATIVE NEGATIVE   Leukocytes,Ua NEGATIVE NEGATIVE  Wet prep, genital     Status: Abnormal   Collection Time: 04/27/19  4:48 PM   Specimen: Vaginal  Result Value Ref Range   Yeast Wet Prep HPF POC NONE SEEN NONE SEEN   Trich, Wet Prep NONE SEEN NONE SEEN   Clue Cells Wet Prep HPF POC NONE SEEN NONE SEEN   WBC, Wet Prep HPF POC FEW (A) NONE SEEN   Sperm NONE SEEN    Pt informed that the ultrasound is considered a limited OB ultrasound and is not intended to be a complete ultrasound exam.  Patient also informed that the ultrasound is not being completed with the intent of assessing for fetal or placental anomalies or any pelvic abnormalities.  Explained that the purpose of today's ultrasound is to assess for  viability.  Patient acknowledges the purpose of the exam and the limitations of the study.    +FHT 120 seen on [place]  MAU Course  Procedures  MDM Patient reassured that some pain is normal in early pregnancy, and that once we know the pregnancy is in the uterus that is ok to continue to monitor. Warning signs reviewed with the patient.   Assessment and Plan   1. Pelvic pain in pregnancy, antepartum, first trimester   2. Round ligament pain   3. [redacted] weeks gestation of pregnancy    DC home Comfort measures reviewed  1st Trimester precautions  Bleeding precautions RX: no new rx  Return to MAU as needed FU with OB as planned  [place] [facility] Follow up.   Specialty: Obstetrics and Gynecology Contact  information: [place] [address], [address] [id number] mc [place] [id number] Anon Raices DNP, CNM  04/27/19  5:35 PM

## 2019-05-01 LAB — GC/CHLAMYDIA PROBE AMP (~~LOC~~) NOT AT ARMC
Chlamydia: NEGATIVE
Neisseria Gonorrhea: NEGATIVE

## 2019-05-07 ENCOUNTER — Other Ambulatory Visit: Payer: Self-pay

## 2019-05-07 ENCOUNTER — Inpatient Hospital Stay (HOSPITAL_COMMUNITY)
Admission: AD | Admit: 2019-05-07 | Discharge: 2019-05-07 | Disposition: A | Discharge status: Home / Self Care | Payer: Medicaid Other | Attending: Obstetrics and Gynecology | Admitting: Obstetrics and Gynecology

## 2019-05-07 DIAGNOSIS — K5900 Constipation, unspecified: Secondary | ICD-10-CM | POA: Diagnosis not present

## 2019-05-07 DIAGNOSIS — Z362 Encounter for other antenatal screening follow-up: Secondary | ICD-10-CM

## 2019-05-07 DIAGNOSIS — O26891 Other specified pregnancy related conditions, first trimester: Secondary | ICD-10-CM | POA: Diagnosis not present

## 2019-05-07 DIAGNOSIS — Z3A08 8 weeks gestation of pregnancy: Secondary | ICD-10-CM | POA: Diagnosis not present

## 2019-05-07 DIAGNOSIS — Z87891 Personal history of nicotine dependence: Secondary | ICD-10-CM | POA: Diagnosis not present

## 2019-05-07 DIAGNOSIS — O99611 Diseases of the digestive system complicating pregnancy, first trimester: Secondary | ICD-10-CM | POA: Diagnosis not present

## 2019-05-07 DIAGNOSIS — R109 Unspecified abdominal pain: Secondary | ICD-10-CM | POA: Diagnosis not present

## 2019-05-07 LAB — CBC WITH DIFFERENTIAL/PLATELET
Abs Immature Granulocytes: 0.01 10*3/uL (ref 0.00–0.07)
Basophils Absolute: 0 10*3/uL (ref 0.0–0.1)
Basophils Relative: 0 %
Eosinophils Absolute: 0.2 10*3/uL (ref 0.0–0.5)
Eosinophils Relative: 4 %
HCT: 37.3 % (ref 36.0–46.0)
Hemoglobin: 12 g/dL (ref 12.0–15.0)
Immature Granulocytes: 0 %
Lymphocytes Relative: 48 %
Lymphs Abs: 2.5 10*3/uL (ref 0.7–4.0)
MCH: 26.7 pg (ref 26.0–34.0)
MCHC: 32.2 g/dL (ref 30.0–36.0)
MCV: 82.9 fL (ref 80.0–100.0)
Monocytes Absolute: 0.3 10*3/uL (ref 0.1–1.0)
Monocytes Relative: 7 %
Neutro Abs: 2.1 10*3/uL (ref 1.7–7.7)
Neutrophils Relative %: 41 %
Platelets: 214 10*3/uL (ref 150–400)
RBC: 4.5 MIL/uL (ref 3.87–5.11)
RDW: 12.8 % (ref 11.5–15.5)
WBC: 5.1 10*3/uL (ref 4.0–10.5)
nRBC: 0 % (ref 0.0–0.2)

## 2019-05-07 LAB — URINALYSIS, ROUTINE W REFLEX MICROSCOPIC
Bilirubin Urine: NEGATIVE
Glucose, UA: NEGATIVE mg/dL
Hgb urine dipstick: NEGATIVE
Ketones, ur: NEGATIVE mg/dL
Leukocytes,Ua: NEGATIVE
Nitrite: NEGATIVE
Protein, ur: NEGATIVE mg/dL
Specific Gravity, Urine: 1.026 (ref 1.005–1.030)
pH: 6 (ref 5.0–8.0)

## 2019-05-07 MED ORDER — IBUPROFEN 600 MG PO TABS
600.0000 mg | ORAL_TABLET | Freq: Once | ORAL | Status: AC
  Administered 2019-05-07: 600 mg via ORAL
  Filled 2019-05-07: qty 1

## 2019-05-07 NOTE — MAU Provider Note (Addendum)
History     CSN: [id number]  Arrival date and time: 05/07/19 1752   First Provider Initiated Contact with Patient 05/07/19 1932      Chief Complaint  Patient presents with  . Abdominal Pain  . Vaginal Discharge   HPI   Andrea Jenkins is a 23 y.o. female G2P1001 @ 3139w6d here in MAU with complaints of middle abdominal pain. The pain started 2-3 days ago. At times the pain is constant other times the pain comes in waves. She has never had this pain before. She has tried tylenol OTC- unsure of the dose. The tylenol did not help. She did not have this pain with her first pregnancy. Laying down makes it better. No bleeding. White discharge without an odor.  + constipation. Last BM was Friday. Drinks 1-2 bottles of water per day, the rest is sprite or soda.   OB History    Gravida  2   Para  1   Term  1   Preterm  0   AB  0   Living  1     SAB  0   TAB  0   Ectopic  0   Multiple  0   Live Births  1           Past Medical History:  Diagnosis Date  . Asthma    pt states she doesn't use inhaler much (04/10/19)  . Gonorrhea   . Heart murmur    when younger  . Vaginal Pap smear, abnormal     Past Surgical History:  Procedure Laterality Date  . CATARACT EXTRACTION    . WISDOM TOOTH EXTRACTION    . WRIST SURGERY      Family History  Problem Relation Age of Onset  . Hypertension Mother   . Miscarriages / [place] Mother   . Hypertension Maternal Grandmother   . Asthma Maternal Grandmother   . Diabetes Maternal Grandmother   . Vision loss Maternal Grandmother   . Mental illness Brother   . Cancer Maternal Aunt   . Heart disease Neg Hx     Social History   Tobacco Use  . Smoking status: Former Smoker    Packs/day: 0.25    Types: Cigars    Quit date: 03/11/2017    Years since quitting: 2.1  . Smokeless tobacco: Never Used  Substance Use Topics  . Alcohol use: No    Alcohol/week: 0.0 standard drinks    Comment: mimosa on saturday. 04/06/19   . Drug use: No    Allergies:  Allergies  Allergen Reactions  . Peach [Prunus Persica] Hives    Medications Prior to Admission  Medication Sig Dispense Refill Last Dose  . cyclobenzaprine (FLEXERIL) 10 MG tablet Take 1 tablet (10 mg total) by mouth 3 (three) times daily as needed for muscle spasms. 30 tablet 0   . metoCLOPramide (REGLAN) 10 MG tablet Take 1 tablet (10 mg total) by mouth 4 (four) times daily -  before meals and at bedtime. 90 tablet 1   . Prenat w/o A-FeCbn-Meth-FA-DHA (PRENATE MINI) 29-0.6-0.4-350 MG CAPS Take 1 capsule by mouth daily before breakfast. (Patient not taking: Reported on 02/28/2019) 30 capsule 11    Results for orders placed or performed during the hospital encounter of 05/07/19 (from the past 48 hour(s))  Urinalysis, Routine w reflex microscopic     Status: Abnormal   Collection Time: 05/07/19  7:01 PM  Result Value Ref Range   Color, Urine YELLOW YELLOW  APPearance HAZY (A) CLEAR   Specific Gravity, Urine 1.026 1.005 - 1.030   pH 6.0 5.0 - 8.0   Glucose, UA NEGATIVE NEGATIVE mg/dL   Hgb urine dipstick NEGATIVE NEGATIVE   Bilirubin Urine NEGATIVE NEGATIVE   Ketones, ur NEGATIVE NEGATIVE mg/dL   Protein, ur NEGATIVE NEGATIVE mg/dL   Nitrite NEGATIVE NEGATIVE   Leukocytes,Ua NEGATIVE NEGATIVE    Comment: Performed at [facility] [address], [place], [place] [address]   Review of Systems  Gastrointestinal: Positive for abdominal pain and constipation.  Genitourinary: Negative for vaginal bleeding and vaginal discharge.   Physical Exam   Blood pressure 116/63, pulse 69, temperature 98.6 F (37 C), temperature source Oral, resp. rate 16, weight 80.6 kg, last menstrual period 03/06/2019, SpO2 100 %, not currently breastfeeding.  Physical Exam  Constitutional: She is oriented to person, place, and time. She appears well-developed and well-nourished.  Non-toxic appearance. She does not have a sickly appearance. She does not appear ill. No  distress.  HENT:  Head: Normocephalic.  GI: Soft. Normal appearance. There is generalized abdominal tenderness. There is no rigidity, no rebound and no guarding.  Musculoskeletal: Normal range of motion.  Neurological: She is alert and oriented to person, place, and time.  Skin: Skin is warm. She is not diaphoretic.  Psychiatric: Her behavior is normal.   MAU Course  Procedures   Pt informed that the ultrasound is considered a limited OB ultrasound and is not intended to be a complete ultrasound exam.  Patient also informed that the ultrasound is not being completed with the intent of assessing for fetal or placental anomalies or any pelvic abnormalities.  Explained that the purpose of today's ultrasound is to assess for  viability.  Patient acknowledges the purpose of the exam and the limitations of the study.  Active fetus with + heart tones.   MDM  Bedside US done CBC with diff Ibuprofen 600 mg PO X 1 dose Report given to Vilma Prader CNM who resumes care of the patient.   Lezlie Lye, NP 05/07/2019 8:19 PM   Assessment and Plan   1. Abdominal pain during pregnancy in first trimester   2. Constipation during pregnancy in first trimester     Single dose of ibuprofen given in MAU with good pain relief.  CBC unremarkable. Discussed dietary changes to improve constipation, increase PO fluids especially D/C home F/U with [facility] as scheduled, return to MAU with emergencies  Fatima Blank, CNM 11:24 AM

## 2019-05-07 NOTE — MAU Note (Signed)
For the last 2-3 days has been having pain in her lower.  Was so bad she just had to lay down.  Called where she will be going for prenatal care, was told to come here.

## 2019-05-15 ENCOUNTER — Ambulatory Visit (INDEPENDENT_AMBULATORY_CARE_PROVIDER_SITE_OTHER): Payer: Medicaid Other

## 2019-05-15 DIAGNOSIS — Z3480 Encounter for supervision of other normal pregnancy, unspecified trimester: Secondary | ICD-10-CM

## 2019-05-15 MED ORDER — BLOOD PRESSURE KIT: PACK | 0 refills | Status: DC

## 2019-05-15 NOTE — Progress Notes (Signed)
  Virtual Visit via Telephone Note  I connected with Andrea Jenkins on 05/15/19 at  1:15 PM EDT by telephone and verified that I am speaking with the correct person using two identifiers.  Location: Patient: Andrea Jenkins Provider: [facility] [place]   I discussed the limitations, risks, security and privacy concerns of performing an evaluation and management service by telephone and the availability of in person appointments. I also discussed with the patient that there may be a patient responsible charge related to this service. The patient expressed understanding and agreed to proceed.   History of Present Illness: PRENATAL INTAKE SUMMARY  Andrea Jenkins presents today New OB Nurse Interview.  OB History    Gravida  2   Para  1   Term  1   Preterm  0   AB  0   Living  1     SAB  0   TAB  0   Ectopic  0   Multiple  0   Live Births  1          I have reviewed the patient's medical, obstetrical, social, and family histories, medications, and available lab results.  SUBJECTIVE She has no unusual complaints   Observations/Objective: Initial nurse interview for history (New OB)  EDD: 12/11/2019 GA: 10.0 G2/P1 FHT: non face to face interview  GENERAL APPEARANCE: alert, well appearing, oriented to person, place and time, non face to face interview  Assessment and Plan: Normal pregnancy [facility]- [facility]  Prenatal labs/physical to be completed at provider visit Advised to download [email or web address]  BP cuff explained, ordered, and faxed today   Follow Up Instructions:   I discussed the assessment and treatment plan with the patient. The patient was provided an opportunity to ask questions and all were answered. The patient agreed with the plan and demonstrated an understanding of the instructions.   I provided 20 minutes of non-face-to-face time during this encounter.   Maryruth Eve, CMA

## 2019-05-16 NOTE — Progress Notes (Signed)
Patient seen and assessed by nursing staff during this encounter. I have reviewed the chart and agree with the documentation and plan.  Mora Bellman, MD 05/16/2019 10:28 AM

## 2019-05-24 DIAGNOSIS — O339 Maternal care for disproportion, unspecified: Secondary | ICD-10-CM | POA: Diagnosis not present

## 2019-05-25 ENCOUNTER — Inpatient Hospital Stay (HOSPITAL_COMMUNITY)
Admission: AD | Admit: 2019-05-25 | Discharge: 2019-05-25 | Disposition: A | Discharge status: Home / Self Care | Payer: Medicaid Other | Attending: Obstetrics and Gynecology | Admitting: Obstetrics and Gynecology

## 2019-05-25 ENCOUNTER — Other Ambulatory Visit: Payer: Self-pay

## 2019-05-25 ENCOUNTER — Telehealth: Payer: Self-pay

## 2019-05-25 ENCOUNTER — Encounter (HOSPITAL_COMMUNITY): Payer: Self-pay | Admitting: *Deleted

## 2019-05-25 DIAGNOSIS — Z3A11 11 weeks gestation of pregnancy: Secondary | ICD-10-CM

## 2019-05-25 DIAGNOSIS — O209 Hemorrhage in early pregnancy, unspecified: Secondary | ICD-10-CM | POA: Insufficient documentation

## 2019-05-25 DIAGNOSIS — N760 Acute vaginitis: Secondary | ICD-10-CM | POA: Diagnosis not present

## 2019-05-25 DIAGNOSIS — B9689 Other specified bacterial agents as the cause of diseases classified elsewhere: Secondary | ICD-10-CM | POA: Insufficient documentation

## 2019-05-25 DIAGNOSIS — O98811 Other maternal infectious and parasitic diseases complicating pregnancy, first trimester: Secondary | ICD-10-CM | POA: Diagnosis not present

## 2019-05-25 DIAGNOSIS — Z87891 Personal history of nicotine dependence: Secondary | ICD-10-CM | POA: Insufficient documentation

## 2019-05-25 DIAGNOSIS — O23591 Infection of other part of genital tract in pregnancy, first trimester: Secondary | ICD-10-CM | POA: Diagnosis not present

## 2019-05-25 DIAGNOSIS — Z3491 Encounter for supervision of normal pregnancy, unspecified, first trimester: Secondary | ICD-10-CM | POA: Diagnosis not present

## 2019-05-25 LAB — URINALYSIS, ROUTINE W REFLEX MICROSCOPIC
Bilirubin Urine: NEGATIVE
Glucose, UA: NEGATIVE mg/dL
Hgb urine dipstick: NEGATIVE
Ketones, ur: 5 mg/dL — AB
Nitrite: NEGATIVE
Protein, ur: NEGATIVE mg/dL
Specific Gravity, Urine: 1.023 (ref 1.005–1.030)
pH: 6 (ref 5.0–8.0)

## 2019-05-25 LAB — WET PREP, GENITAL
Trich, Wet Prep: NONE SEEN
Yeast Wet Prep HPF POC: NONE SEEN

## 2019-05-25 MED ORDER — COMFORT FIT MATERNITY SUPP SM MISC: 1.0000 [IU] | Freq: Every day | 0 refills | Status: DC | PRN

## 2019-05-25 MED ORDER — SOLOSEC 2 G PO PACK: 1.0000 | PACK | Freq: Once | ORAL | 0 refills | Status: AC

## 2019-05-25 NOTE — Telephone Encounter (Signed)
TC from pt  Early pregnant w/complaints of vaginal bleeding and possible BV Consulted w/ provider pt needs to report to MAU and keep scheduled appt next week NOB.

## 2019-05-25 NOTE — MAU Provider Note (Signed)
Chief Complaint: Vaginal Bleeding   First Provider Initiated Contact with Patient 05/25/19 1203     SUBJECTIVE HPI: Andrea Jenkins is a 23 y.o. G2P1001 at 57w3dwho presents to Maternity Admissions reporting vaginal bleeding. Symptoms started Tuesday. Reports intermittent pink spotting on toilet paper. Has also noticed foul smelling white discharge and thinks she has BV. No itching or irritation. Last intercourse was Wednesday. Denies dysuria or abdominal pain.    Past Medical History:  Diagnosis Date  . Asthma    pt states she doesn't use inhaler much (04/10/19)  . Gonorrhea   . Heart murmur    when younger  . Vaginal Pap smear, abnormal    OB History  Gravida Para Term Preterm AB Living  _0 0 0 1  SAB TAB Ectopic Multiple Live Births  0 0 0 0 1    # Outcome Date GA Lbr Len/2nd Weight Sex Delivery Anes PTL Lv  2 Current           1 Term 01/18/18 [id number]:57 / 00:18 3365 g F Vag-Spont EPI  LIV   Past Surgical History:  Procedure Laterality Date  . CATARACT EXTRACTION    . WISDOM TOOTH EXTRACTION    . WRIST SURGERY     Social History   Socioeconomic History  . Marital status: Single    Spouse name: Not on file  . Number of children: Not on file  . Years of education: Not on file  . Highest education level: Not on file  Occupational History  . Not on file  Social Needs  . Financial resource strain: Not on file  . Food insecurity    Worry: Not on file    Inability: Not on file  . Transportation needs    Medical: Not on file    Non-medical: Not on file  Tobacco Use  . Smoking status: Former Smoker    Packs/day: 0.25    Types: Cigars    Quit date: 03/11/2017    Years since quitting: 2.2  . Smokeless tobacco: Never Used  Substance and Sexual Activity  . Alcohol use: No    Alcohol/week: 0.0 standard drinks    Comment: mimosa on saturday. 04/06/19  . Drug use: No  . Sexual activity: Yes    Partners: Male    Birth control/protection: None  Lifestyle  .  Physical activity    Days per week: Not on file    Minutes per session: Not on file  . Stress: Not on file  Relationships  . Social coHerbalistn phone: Not on file    Gets together: Not on file    Attends religious service: Not on file    Active member of club or organization: Not on file    Attends meetings of clubs or organizations: Not on file    Relationship status: Not on file  . Intimate partner violence    Fear of current or ex partner: Not on file    Emotionally abused: Not on file    Physically abused: Not on file    Forced sexual activity: Not on file  Other Topics Concern  . Not on file  Social History Narrative  . Not on file   Family History  Problem Relation Age of Onset  . Hypertension Mother   . [place]iscarriages / [place]   . Hypertension Maternal Grandmother   . Asthma Maternal Grandmother   . Diabetes Maternal Grandmother   . Vision loss  Maternal Grandmother   . Mental illness Brother   . Cancer Maternal Aunt   . Heart disease Neg Hx    No current facility-administered medications on file prior to encounter.    Current Outpatient Medications on File Prior to Encounter  Medication Sig Dispense Refill  . Prenat w/o A-FeCbn-Meth-FA-DHA (PRENATE MINI) 29-0.6-0.4-350 MG CAPS Take 1 capsule by mouth daily before breakfast. 30 capsule 11  . Blood Pressure KIT Monitor BP readings at home regularly O09.90 large 1 kit 0  . cyclobenzaprine (FLEXERIL) 10 MG tablet Take 1 tablet (10 mg total) by mouth 3 (three) times daily as needed for muscle spasms. (Patient not taking: Reported on 05/15/2019) 30 tablet 0  . metoCLOPramide (REGLAN) 10 MG tablet Take 1 tablet (10 mg total) by mouth 4 (four) times daily -  before meals and at bedtime. (Patient not taking: Reported on 05/15/2019) 90 tablet 1   Allergies  Allergen Reactions  . Peach [Prunus Persica] Hives    I have reviewed patient's Past Medical Hx, Surgical Hx, Family Hx, Social Hx, medications and  allergies.   Review of Systems  Constitutional: Negative.   Gastrointestinal: Negative.   Genitourinary: Positive for vaginal bleeding and vaginal discharge. Negative for dysuria.    OBJECTIVE Patient Vitals for the past 24 hrs:  BP Temp Temp src Pulse Resp SpO2 Weight  05/25/19 1337 (!) 105/58 - - 62 - - -  05/25/19 1136 113/65 98.2 F (36.8 C) Oral 79 16 100 % 77.8 kg   FHR 162 bpm  Constitutional: Well-developed, well-nourished female in no acute distress.  Cardiovascular: normal rate & rhythm, no murmur Respiratory: normal rate and effort. Lung sounds clear throughout GI: Abd soft, non-tender, Pos BS x 4. No guarding or rebound tenderness MS: Extremities nontender, no edema, normal ROM Neurologic: Alert and oriented x 4.  GU:  Thick white discharge. No blood    LAB RESULTS Results for orders placed or performed during the hospital encounter of 05/25/19 (from the past 24 hour(s))  Urinalysis, Routine w reflex microscopic     Status: Abnormal   Collection Time: 05/25/19 12:20 PM  Result Value Ref Range   Color, Urine YELLOW YELLOW   APPearance HAZY (A) CLEAR   Specific Gravity, Urine 1.023 1.005 - 1.030   pH 6.0 5.0 - 8.0   Glucose, UA NEGATIVE NEGATIVE mg/dL   Hgb urine dipstick NEGATIVE NEGATIVE   Bilirubin Urine NEGATIVE NEGATIVE   Ketones, ur 5 (A) NEGATIVE mg/dL   Protein, ur NEGATIVE NEGATIVE mg/dL   Nitrite NEGATIVE NEGATIVE   Leukocytes,Ua MODERATE (A) NEGATIVE   RBC / HPF 0-5 0 - 5 RBC/hpf   WBC, UA 0-5 0 - 5 WBC/hpf   Bacteria, UA RARE (A) NONE SEEN   Squamous Epithelial / LPF 11-20 0 - 5   Mucus PRESENT   Wet prep, genital     Status: Abnormal   Collection Time: 05/25/19 12:33 PM   Specimen: Urine, Clean Catch  Result Value Ref Range   Yeast Wet Prep HPF POC NONE SEEN NONE SEEN   Trich, Wet Prep NONE SEEN NONE SEEN   Clue Cells Wet Prep HPF POC PRESENT (A) NONE SEEN   WBC, Wet Prep HPF POC FEW (A) NONE SEEN   Sperm PRESENT     IMAGING No results  found.  MAU COURSE Orders Placed This Encounter  Procedures  . Wet prep, genital  . Culture, OB Urine  . Urinalysis, Routine w reflex microscopic  . Discharge patient  Meds ordered this encounter  Medications  . Secnidazole (SOLOSEC) 2 g PACK    Sig: Take 1 Package by mouth once for 1 dose. Mix with applesauce    Dispense:  1 each    Refill:  0    Order Specific Question:   Supervising Provider    Answer:   ERVIN, MICHAEL L [[id number]]  . Elastic Bandages & Supports (COMFORT FIT MATERNITY SUPP SM) MISC    Sig: 1 Units by Does not apply route daily as needed.    Dispense:  1 each    Refill:  0    Order Specific Question:   Supervising Provider    Answer:   Rip Harbour, MICHAEL L [[id number]]    MDM FHR present via doppler No bleeding now RH positive  Wet prep + clue cells Will rx solosec per patient request Pt also requesting a maternity support belt.  ASSESSMENT 1. Bacterial vaginosis   2. Vaginal bleeding in pregnancy, first trimester   3. [redacted] weeks gestation of pregnancy   4. Fetal heart tones present, first trimester     PLAN Discharge home in stable condition. Bleeding precautions GC/CT & urine culture pending Rx solosec Rx maternity support belt & given info about [employer]  Follow-up Information    [facility] Follow up.   Specialty: Obstetrics and Gynecology Why: return for worsening symptoms Contact information: [address] [id number] [place] [phone]         Allergies as of 05/25/2019      Reactions   Peach [prunus Persica] Hives      Medication List    STOP taking these medications   cyclobenzaprine 10 MG tablet Commonly known as: FLEXERIL   metoCLOPramide 10 MG tablet Commonly known as: REGLAN     TAKE these medications   Blood Pressure Kit Monitor BP readings at home regularly O09.90 large   [employer] 1 Units by Does not apply route daily as needed.    Prenate Mini 29-0.6-0.4-350 MG Caps Take 1 capsule by mouth daily before breakfast.   Solosec 2 g Pack Generic drug: Secnidazole Take 1 Package by mouth once for 1 dose. Mix with applesauce        Jorje Guild, NP 05/25/2019  1:46 PM

## 2019-05-25 NOTE — MAU Note (Signed)
The last couple days has had spotting off and on.  Called office, told to come here.no pain.

## 2019-05-25 NOTE — Discharge Instructions (Signed)
Bacterial Vaginosis  Bacterial vaginosis is a vaginal infection that occurs when the normal balance of bacteria in the vagina is disrupted. It results from an overgrowth of certain bacteria. This is the most common vaginal infection among women ages 71-44. Because bacterial vaginosis increases your risk for STIs (sexually transmitted infections), getting treated can help reduce your risk for chlamydia, gonorrhea, herpes, and HIV (human immunodeficiency virus). Treatment is also important for preventing complications in pregnant women, because this condition can cause an early (premature) delivery. What are the causes? This condition is caused by an increase in harmful bacteria that are normally present in small amounts in the vagina. However, the reason that the condition develops is not fully understood. What increases the risk? The following factors may make you more likely to develop this condition:  Having a new sexual partner or multiple sexual partners.  Having unprotected sex.  Douching.  Having an intrauterine device (IUD).  Smoking.  Drug and alcohol abuse.  Taking certain antibiotic medicines.  Being pregnant. You cannot get bacterial vaginosis from toilet seats, bedding, swimming pools, or contact with objects around you. What are the signs or symptoms? Symptoms of this condition include:  Grey or white vaginal discharge. The discharge can also be watery or foamy.  A fish-like odor with discharge, especially after sexual intercourse or during menstruation.  Itching in and around the vagina.  Burning or pain with urination. Some women with bacterial vaginosis have no signs or symptoms. How is this diagnosed? This condition is diagnosed based on:  Your medical history.  A physical exam of the vagina.  Testing a sample of vaginal fluid under a microscope to look for a large amount of bad bacteria or abnormal cells. Your health care provider may use a cotton swab or  a small wooden spatula to collect the sample. How is this treated? This condition is treated with antibiotics. These may be given as a pill, a vaginal cream, or a medicine that is put into the vagina (suppository). If the condition comes back after treatment, a second round of antibiotics may be needed. Follow these instructions at home: Medicines  Take over-the-counter and prescription medicines only as told by your health care provider.  Take or use your antibiotic as told by your health care provider. Do not stop taking or using the antibiotic even if you start to feel better. General instructions  If you have a female sexual partner, tell her that you have a vaginal infection. She should see her health care provider and be treated if she has symptoms. If you have a female sexual partner, he does not need treatment.  During treatment: ? Avoid sexual activity until you finish treatment. ? Do not douche. ? Avoid alcohol as directed by your health care provider. ? Avoid breastfeeding as directed by your health care provider.  Drink enough water and fluids to keep your urine clear or pale yellow.  Keep the area around your vagina and rectum clean. ? Wash the area daily with warm water. ? Wipe yourself from front to back after using the toilet.  Keep all follow-up visits as told by your health care provider. This is important. How is this prevented?  Do not douche.  Wash the outside of your vagina with warm water only.  Use protection when having sex. This includes latex condoms and dental dams.  Limit how many sexual partners you have. To help prevent bacterial vaginosis, it is best to have sex with just one partner (  monogamous).  Make sure you and your sexual partner are tested for STIs.  Wear cotton or cotton-lined underwear.  Avoid wearing tight pants and pantyhose, especially during summer.  Limit the amount of alcohol that you drink.  Do not use any products that contain  nicotine or tobacco, such as cigarettes and e-cigarettes. If you need help quitting, ask your health care provider.  Do not use illegal drugs. Where to find more information  Centers for Disease Control and Prevention: AppraiserFraud.fi  American Sexual Health Association (ASHA): www.ashastd.org  U.S. Department of Health and Financial controller, Office on Women's Health: DustingSprays.pl or SecuritiesCard.it Contact a health care provider if:  Your symptoms do not improve, even after treatment.  You have more discharge or pain when urinating.  You have a fever.  You have pain in your abdomen.  You have pain during sex.  You have vaginal bleeding between periods. Summary  Bacterial vaginosis is a vaginal infection that occurs when the normal balance of bacteria in the vagina is disrupted.  Because bacterial vaginosis increases your risk for STIs (sexually transmitted infections), getting treated can help reduce your risk for chlamydia, gonorrhea, herpes, and HIV (human immunodeficiency virus). Treatment is also important for preventing complications in pregnant women, because the condition can cause an early (premature) delivery.  This condition is treated with antibiotic medicines. These may be given as a pill, a vaginal cream, or a medicine that is put into the vagina (suppository). This information is not intended to replace advice given to you by your health care provider. Make sure you discuss any questions you have with your health care provider. Document Released: 10/11/2005 Document Revised: 09/23/2017 Document Reviewed: 06/26/2016 Elsevier Patient Education  2020 [place]: You are not alone, Seventy-five percent of women have some sort of abdominal or back pain at some point in their pregnancy. Your baby is growing at a fast pace, which means that your whole body is rapidly trying to adjust to  the changes. As your uterus grows, your back may start feeling a bit under stress and this can result in back or abdominal pain that can go from mild, and therefore bearable, to severe pains that will not allow you to sit or lay down comfortably, When it comes to dealing with pregnancy-related pains and cramps, some pregnant women usually prefer natural remedies, which the market is filled with nowadays. For example, wearing a pregnancy support belt can help ease and lessen your discomfort and pain. WHAT ARE THE BENEFITS OF WEARING A PREGNANCY SUPPORT BELT? A pregnancy support belt provides support to the lower portion of the belly taking some of the weight of the growing uterus and distributing to the other parts of your body. It is designed make you comfortable and gives you extra support. Over the years, the pregnancy apparel market has been studying the needs and wants of pregnant women and they have come up with the most comfortable pregnancy support belts that woman could ever ask for. In fact, you will no longer have to wear a stretched-out or bulky pregnancy belt that is visible underneath your clothes and makes you feel even more uncomfortable. Nowadays, a pregnancy support belt is made of comfortable and stretchy materials that will not irritate your skin but will actually make you feel at ease and you will not even notice you are wearing it. They are easy to put on and adjust during the day and can be worn at  night for additional support.  BENEFITS:  Relives Back pain  Relieves Abdominal Muscle and Leg Pain  Stabilizes the Pelvic Ring  Offers a Cushioned Abdominal Lift Pad  Relieves pressure on the Sciatic Nerve Within Minutes WHERE TO GET YOUR PREGNANCY BELT: [employer] [phone]  [address] [place], [place] [id number]

## 2019-05-26 LAB — GC/CHLAMYDIA PROBE AMP (~~LOC~~) NOT AT ARMC
Chlamydia: NEGATIVE
Neisseria Gonorrhea: NEGATIVE

## 2019-05-26 LAB — CULTURE, OB URINE: Culture: <10000 — AB

## 2019-05-30 ENCOUNTER — Encounter: Payer: Self-pay | Admitting: Certified Nurse Midwife

## 2019-05-30 ENCOUNTER — Other Ambulatory Visit: Payer: Self-pay

## 2019-05-30 ENCOUNTER — Ambulatory Visit (INDEPENDENT_AMBULATORY_CARE_PROVIDER_SITE_OTHER): Payer: Medicaid Other | Admitting: Certified Nurse Midwife

## 2019-05-30 VITALS — BP 117/77 | HR 96 | Wt 169.0 lb

## 2019-05-30 DIAGNOSIS — O09891 Supervision of other high risk pregnancies, first trimester: Secondary | ICD-10-CM

## 2019-05-30 DIAGNOSIS — M5431 Sciatica, right side: Secondary | ICD-10-CM

## 2019-05-30 DIAGNOSIS — Z3480 Encounter for supervision of other normal pregnancy, unspecified trimester: Secondary | ICD-10-CM | POA: Diagnosis not present

## 2019-05-30 DIAGNOSIS — O09899 Supervision of other high risk pregnancies, unspecified trimester: Secondary | ICD-10-CM | POA: Insufficient documentation

## 2019-05-30 DIAGNOSIS — Z3481 Encounter for supervision of other normal pregnancy, first trimester: Secondary | ICD-10-CM | POA: Diagnosis not present

## 2019-05-30 DIAGNOSIS — Z3A12 12 weeks gestation of pregnancy: Secondary | ICD-10-CM

## 2019-05-30 MED ORDER — COMFORT FIT MATERNITY SUPP LG MISC: 1.0000 [IU] | Freq: Every day | 0 refills | Status: DC

## 2019-05-30 NOTE — Progress Notes (Signed)
   PRENATAL VISIT NOTE  Subjective:  Andrea Jenkins is a 23 y.o. G2P1001 at [redacted]w[redacted]d being seen today for initial prenatal care.  She is currently monitored for the following issues for this low-risk pregnancy and has LGSIL on Pap smear of cervix; Blindness of right eye; Supervision of other normal pregnancy, antepartum; and Short interval between pregnancies affecting pregnancy, antepartum on their problem list.  Patient reports loss of appetite without nausea or vomiting. She also reports that 2-3 days ago she had a sharp pain in her right side that radiated down her leg that intermittently comes and goes. She was recently seen in [facility]U for vaginal bleeding and malodorous discharge and treated for BV. Finished all medications and has no complaints today.    The following portions of the patient's history were reviewed and updated as appropriate: allergies, current medications, past family history, past medical history, past social history, past surgical history and problem list.   Objective:   Vitals:   05/30/19 1302  BP: 117/77  Pulse: 96  Weight: 76.7 kg    Fetal Status: Fetal Heart Rate (bpm): 163   Movement: Present     General:  Alert, oriented and cooperative. Patient is in no acute distress.  Skin: Skin is warm and dry. No rash noted.   Cardiovascular: Normal heart rate noted  Respiratory: Normal respiratory effort, no problems with respiration noted  Abdomen: Soft, gravid, appropriate for gestational age.  Pain/Pressure: Absent     Pelvic: Cervical exam deferred        Extremities: Normal range of motion.  Edema: None  Mental Status: Normal mood and affect. Normal behavior. Normal judgment and thought content.   Assessment and Plan:  Pregnancy: G2P1001 at [redacted]w[redacted]d 1. Supervision of other normal pregnancy, antepartum - Initial prenatal visit and initial labs today - Encouraged pt to eat small meals and snacks high in protein to help with loss of appetite - Anticipatory  guidance for upcoming appointments  - [place] MFM OB COMP + 14 WK; Future - Obstetric Panel, Including HIV - Babyscripts Schedule Optimization - Hemoglobinopathy evaluation - Genetic Screening  2. Short interval between pregnancies affecting pregnancy, antepartum   3. Sciatica without back pain, right - Pt reporting intermittent right sided pain that radiates down leg - Encouraged rest and hydration along with use of maternity support band. Rx'd today.   - Elastic Bandages & Supports (COMFORT FIT MATERNITY SUPP LG) MISC; 1 Units by Does not apply route daily.  Dispense: 1 each; Refill: 0   Preterm labor symptoms and general obstetric precautions including but not limited to vaginal bleeding, contractions, leaking of fluid and fetal movement were reviewed in detail with the patient.  Please refer to After Visit Summary for other counseling recommendations.   Return in about 6 weeks (around 07/11/2019) for ROB/AFP.  Future Appointments  Date Time Provider [place]  07/12/2019 10:30 AM Lajean Manes, CNM [facility] None    Renee Harder, Student-MidWife

## 2019-05-30 NOTE — Patient Instructions (Signed)
Safe Medications in Pregnancy  ° °Acne: °Benzoyl Peroxide °Salicylic Acid ° °Backache/Headache: °Tylenol: 2 regular strength every 4 hours OR °             2 Extra strength every 6 hours ° °Colds/Coughs/Allergies: °Benadryl (alcohol free) 25 mg every 6 hours as needed °Breath right strips °Claritin °Cepacol throat lozenges °Chloraseptic throat spray °Cold-Eeze- up to three times per day °Cough drops, alcohol free °Flonase (by prescription only) °Guaifenesin °Mucinex °Robitussin DM (plain only, alcohol free) °Saline nasal spray/drops °Sudafed (pseudoephedrine) & Actifed ** use only after [redacted] weeks gestation and if you do not have high blood pressure °Tylenol °Vicks Vaporub °Zinc lozenges °Zyrtec  ° °Constipation: °Colace °Ducolax suppositories °Fleet enema °Glycerin suppositories °Metamucil °Milk of magnesia °Miralax °Senokot °Smooth move tea ° °Diarrhea: °Kaopectate °Imodium A-D ° °*NO pepto Bismol ° °Hemorrhoids: °Anusol °Anusol HC °Preparation H °Tucks ° °Indigestion: °Tums °Maalox °Mylanta °Zantac  °Pepcid ° °Insomnia: °Benadryl (alcohol free) 25mg every 6 hours as needed °Tylenol PM °Unisom, no Gelcaps ° °Leg Cramps: °Tums °MagGel ° °Nausea/Vomiting:  °Bonine °Dramamine °Emetrol °Ginger extract °Sea bands °Meclizine  °Nausea medication to take during pregnancy:  °Unisom (doxylamine succinate 25 mg tablets) Take one tablet daily at bedtime. If symptoms are not adequately controlled, the dose can be increased to a maximum recommended dose of two tablets daily (1/2 tablet in the morning, 1/2 tablet mid-afternoon and one at bedtime). °Vitamin B6 100mg tablets. Take one tablet twice a day (up to 200 mg per day). ° °Skin Rashes: °Aveeno products °Benadryl cream or 25mg every 6 hours as needed °Calamine Lotion °1% cortisone cream ° °Yeast infection: °Gyne-lotrimin 7 °Monistat 7 ° ° °**If taking multiple medications, please check labels to avoid duplicating the same active ingredients °**take medication as directed on  the label °** Do not exceed 4000 mg of tylenol in 24 hours °**Do not take medications that contain aspirin or ibuprofen ° ° ° ° °First Trimester of Pregnancy ° °The first trimester of pregnancy is from week 1 until the end of week 13 (months 1 through 3). During this time, your baby will begin to develop inside you. At 6-8 weeks, the eyes and face are formed, and the heartbeat can be seen on ultrasound. At the end of 12 weeks, all the baby's organs are formed. Prenatal care is all the medical care you receive before the birth of your baby. Make sure you get good prenatal care and follow all of your doctor's instructions. °Follow these instructions at home: °Medicines °· Take over-the-counter and prescription medicines only as told by your doctor. Some medicines are safe and some medicines are not safe during pregnancy. °· Take a prenatal vitamin that contains at least 600 micrograms (mcg) of folic acid. °· If you have trouble pooping (constipation), take medicine that will make your stool soft (stool softener) if your doctor approves. °Eating and drinking ° °· Eat regular, healthy meals. °· Your doctor will tell you the amount of weight gain that is right for you. °· Avoid raw meat and uncooked cheese. °· If you feel sick to your stomach (nauseous) or throw up (vomit): °? Eat 4 or 5 small meals a day instead of 3 large meals. °? Try eating a few soda crackers. °? Drink liquids between meals instead of during meals. °· To prevent constipation: °? Eat foods that are high in fiber, like fresh fruits and vegetables, whole grains, and beans. °? Drink enough fluids to keep your pee (urine) clear or pale yellow. °  Activity °· Exercise only as told by your doctor. Stop exercising if you have cramps or pain in your lower belly (abdomen) or low back. °· Do not exercise if it is too hot, too humid, or if you are in a place of great height (high altitude). °· Try to avoid standing for long periods of time. Move your legs often  if you must stand in one place for a long time. °· Avoid heavy lifting. °· Wear low-heeled shoes. Sit and stand up straight. °· You can have sex unless your doctor tells you not to. °Relieving pain and discomfort °· Wear a good support bra if your breasts are sore. °· Take warm water baths (sitz baths) to soothe pain or discomfort caused by hemorrhoids. Use hemorrhoid cream if your doctor says it is okay. °· Rest with your legs raised if you have leg cramps or low back pain. °· If you have puffy, bulging veins (varicose veins) in your legs: °? Wear support hose or compression stockings as told by your doctor. °? Raise (elevate) your feet for 15 minutes, 3-4 times a day. °? Limit salt in your food. °Prenatal care °· Schedule your prenatal visits by the twelfth week of pregnancy. °· Write down your questions. Take them to your prenatal visits. °· Keep all your prenatal visits as told by your doctor. This is important. °Safety °· Wear your seat belt at all times when driving. °· Make a list of emergency phone numbers. The list should include numbers for family, friends, the hospital, and police and fire departments. °General instructions °· Ask your doctor for a referral to a local prenatal class. Begin classes no later than at the start of month 6 of your pregnancy. °· Ask for help if you need counseling or if you need help with nutrition. Your doctor can give you advice or tell you where to go for help. °· Do not use hot tubs, steam rooms, or saunas. °· Do not douche or use tampons or scented sanitary pads. °· Do not cross your legs for long periods of time. °· Avoid all herbs and alcohol. Avoid drugs that are not approved by your doctor. °· Do not use any tobacco products, including cigarettes, chewing tobacco, and electronic cigarettes. If you need help quitting, ask your doctor. You may get counseling or other support to help you quit. °· Avoid cat litter boxes and soil used by cats. These carry germs that can  cause birth defects in the baby and can cause a loss of your baby (miscarriage) or stillbirth. °· Visit your dentist. At home, brush your teeth with a soft toothbrush. Be gentle when you floss. °Contact a doctor if: °· You are dizzy. °· You have mild cramps or pressure in your lower belly. °· You have a nagging pain in your belly area. °· You continue to feel sick to your stomach, you throw up, or you have watery poop (diarrhea). °· You have a bad smelling fluid coming from your vagina. °· You have pain when you pee (urinate). °· You have increased puffiness (swelling) in your face, hands, legs, or ankles. °Get help right away if: °· You have a fever. °· You are leaking fluid from your vagina. °· You have spotting or bleeding from your vagina. °· You have very bad belly cramping or pain. °· You gain or lose weight rapidly. °· You throw up blood. It may look like coffee grounds. °· You are around people who have German measles, fifth disease,   or chickenpox. °· You have a very bad headache. °· You have shortness of breath. °· You have any kind of trauma, such as from a fall or a car accident. °Summary °· The first trimester of pregnancy is from week 1 until the end of week 13 (months 1 through 3). °· To take care of yourself and your unborn baby, you will need to eat healthy meals, take medicines only if your doctor tells you to do so, and do activities that are safe for you and your baby. °· Keep all follow-up visits as told by your doctor. This is important as your doctor will have to ensure that your baby is healthy and growing well. °This information is not intended to replace advice given to you by your health care provider. Make sure you discuss any questions you have with your health care provider. °Document Released: 03/29/2008 Document Revised: 02/01/2019 Document Reviewed: 10/19/2016 °Elsevier Patient Education © 2020 Elsevier Inc. ° °

## 2019-06-02 LAB — OBSTETRIC PANEL, INCLUDING HIV
Antibody Screen: NEGATIVE
Basophils Absolute: 0 10*3/uL (ref 0.0–0.2)
Basos: 0 %
EOS (ABSOLUTE): 0.1 10*3/uL (ref 0.0–0.4)
Eos: 4 %
HIV Screen 4th Generation wRfx: NONREACTIVE
Hematocrit: 35.6 % (ref 34.0–46.6)
Hemoglobin: 11.9 g/dL (ref 11.1–15.9)
Hepatitis B Surface Ag: NEGATIVE
Immature Grans (Abs): 0 10*3/uL (ref 0.0–0.1)
Immature Granulocytes: 0 %
Lymphocytes Absolute: 2 10*3/uL (ref 0.7–3.1)
Lymphs: 59 %
MCH: 27 pg (ref 26.6–33.0)
MCHC: 33.4 g/dL (ref 31.5–35.7)
MCV: 81 fL (ref 79–97)
Monocytes Absolute: 0.2 10*3/uL (ref 0.1–0.9)
Monocytes: 7 %
Neutrophils Absolute: 1 10*3/uL — ABNORMAL LOW (ref 1.4–7.0)
Neutrophils: 30 %
Platelets: 197 10*3/uL (ref 150–450)
RBC: 4.4 x10E6/uL (ref 3.77–5.28)
RDW: 13.9 % (ref 11.7–15.4)
RPR Ser Ql: NONREACTIVE
Rh Factor: POSITIVE
Rubella Antibodies, IGG: 2.41 index (ref >0.99)
WBC: 3.4 10*3/uL (ref 3.4–10.8)

## 2019-06-02 LAB — HEMOGLOBINOPATHY EVALUATION
HGB C: 0 %
HGB S: 0 %
HGB VARIANT: 0 %
Hemoglobin A2 Quantitation: 2.6 % (ref 1.8–3.2)
Hemoglobin F Quantitation: 0 % (ref 0.0–2.0)
Hgb A: 97.4 % (ref 96.4–98.8)

## 2019-06-04 ENCOUNTER — Encounter: Payer: Self-pay | Admitting: Certified Nurse Midwife

## 2019-06-07 ENCOUNTER — Other Ambulatory Visit: Payer: Self-pay

## 2019-06-07 ENCOUNTER — Encounter: Payer: Self-pay | Admitting: Certified Nurse Midwife

## 2019-06-07 ENCOUNTER — Inpatient Hospital Stay (HOSPITAL_COMMUNITY)
Admission: AD | Admit: 2019-06-07 | Discharge: 2019-06-07 | Disposition: A | Discharge status: Home / Self Care | Payer: Medicaid Other | Attending: Obstetrics & Gynecology | Admitting: Obstetrics & Gynecology

## 2019-06-07 ENCOUNTER — Encounter (HOSPITAL_COMMUNITY): Payer: Self-pay | Admitting: *Deleted

## 2019-06-07 DIAGNOSIS — Z87891 Personal history of nicotine dependence: Secondary | ICD-10-CM | POA: Insufficient documentation

## 2019-06-07 DIAGNOSIS — O09899 Supervision of other high risk pregnancies, unspecified trimester: Secondary | ICD-10-CM

## 2019-06-07 DIAGNOSIS — Z3A13 13 weeks gestation of pregnancy: Secondary | ICD-10-CM | POA: Insufficient documentation

## 2019-06-07 DIAGNOSIS — O09891 Supervision of other high risk pregnancies, first trimester: Secondary | ICD-10-CM

## 2019-06-07 DIAGNOSIS — R1030 Lower abdominal pain, unspecified: Secondary | ICD-10-CM | POA: Diagnosis not present

## 2019-06-07 DIAGNOSIS — N949 Unspecified condition associated with female genital organs and menstrual cycle: Secondary | ICD-10-CM

## 2019-06-07 DIAGNOSIS — O26891 Other specified pregnancy related conditions, first trimester: Secondary | ICD-10-CM | POA: Diagnosis not present

## 2019-06-07 LAB — URINALYSIS, ROUTINE W REFLEX MICROSCOPIC
Bilirubin Urine: NEGATIVE
Glucose, UA: NEGATIVE mg/dL
Hgb urine dipstick: NEGATIVE
Ketones, ur: NEGATIVE mg/dL
Leukocytes,Ua: NEGATIVE
Nitrite: NEGATIVE
Protein, ur: NEGATIVE mg/dL
Specific Gravity, Urine: 1.023 (ref 1.005–1.030)
pH: 6 (ref 5.0–8.0)

## 2019-06-07 NOTE — Discharge Instructions (Signed)

## 2019-06-07 NOTE — MAU Provider Note (Signed)
History     CSN: [id number]  Arrival date and time: 06/07/19 1429   First Provider Initiated Contact with Patient 06/07/19 1501      Chief Complaint  Patient presents with  . Abdominal Pain   HPI  Andrea Jenkins is a 23 y.o. G2P1001 at 79w2dwho presents to MAU today with complaint of bilateral lower abdominal pain for 2 weeks. She had this earlier in the pregnancy and at the time of her initial prenatal visit it had resolved, but then came back right after. She states pain does not change with change of positions. She notes more pressure with standing and walking, but no increase in pressure. She has an abdominal binder but does not wear it consistently. She rates her pain at 5/10 and has not taken any pain medicine. She denies vaginal bleeding, discharge, contractions or UTI symptoms. She recently completed treatment for BV.   OB History    Gravida  2   Para  1   Term  1   Preterm  0   AB  0   Living  1     SAB  0   TAB  0   Ectopic  0   Multiple  0   Live Births  1           Past Medical History:  Diagnosis Date  . Asthma    pt states she doesn't use inhaler much (04/10/19)  . Gonorrhea   . Heart murmur    when younger  . Vaginal Pap smear, abnormal     Past Surgical History:  Procedure Laterality Date  . CATARACT EXTRACTION    . WISDOM TOOTH EXTRACTION    . WRIST SURGERY      Family History  Problem Relation Age of Onset  . Hypertension Mother   . Miscarriages / [place]   . Hypertension Maternal Grandmother   . Asthma Maternal Grandmother   . Diabetes Maternal Grandmother   . Vision loss Maternal Grandmother   . Mental illness Brother   . Cancer Maternal Aunt   . Heart disease Neg Hx     Social History   Tobacco Use  . Smoking status: Former Smoker    Packs/day: 0.25    Types: Cigars    Quit date: 03/11/2017    Years since quitting: 2.2  . Smokeless tobacco: Never Used  Substance Use Topics  . Alcohol use: No    Alcohol/week: 0.0 standard drinks    Comment: mimosa on saturday. 04/06/19  . Drug use: No    Allergies:  Allergies  Allergen Reactions  . Peach [Prunus Persica] Hives    Medications Prior to Admission  Medication Sig Dispense Refill Last Dose  . Blood Pressure KIT Monitor BP readings at home regularly O09.90 large 1 kit 0   . Elastic Bandages & Supports (COMFORT FIT MATERNITY SUPP LG) MISC 1 Units by Does not apply route daily. 1 each 0   . Elastic Bandages & Supports (COMFORT FIT MATERNITY SUPP SM) MISC 1 Units by Does not apply route daily as needed. (Patient not taking: Reported on 05/30/2019) 1 each 0   . Prenat w/o A-FeCbn-Meth-FA-DHA (PRENATE MINI) 29-0.6-0.4-350 MG CAPS Take 1 capsule by mouth daily before breakfast. 30 capsule 11     Review of Systems  Constitutional: Negative for fever.  Gastrointestinal: Positive for abdominal pain. Negative for constipation, diarrhea, nausea and vomiting.  Genitourinary: Negative for dysuria, frequency, urgency, vaginal bleeding and vaginal discharge.  Physical Exam   Blood pressure 102/66, pulse 82, temperature 98.5 F (36.9 C), temperature source Oral, resp. rate 16, weight 76.4 kg, last menstrual period 03/06/2019, SpO2 100 %, not currently breastfeeding.  Physical Exam  Nursing note and vitals reviewed. Constitutional: She is oriented to person, place, and time. She appears well-developed and well-nourished. No distress.  HENT:  Head: Normocephalic and atraumatic.  Cardiovascular: Normal rate.  Respiratory: Effort normal.  GI: Soft. She exhibits no distension and no mass. There is no abdominal tenderness. There is no rebound and no guarding.  Neurological: She is alert and oriented to person, place, and time.  Skin: Skin is warm and dry. No erythema.  Psychiatric: She has a normal mood and affect.  Dilation: Closed Effacement (%): Thick Cervical Position: Posterior Exam by:: Kerry Hough, PA-C   Results for orders placed  or performed during the hospital encounter of 06/07/19 (from the past 24 hour(s))  Urinalysis, Routine w reflex microscopic     Status: Abnormal   Collection Time: 06/07/19  3:18 PM  Result Value Ref Range   Color, Urine YELLOW YELLOW   APPearance HAZY (A) CLEAR   Specific Gravity, Urine 1.023 1.005 - 1.030   pH 6.0 5.0 - 8.0   Glucose, UA NEGATIVE NEGATIVE mg/dL   Hgb urine dipstick NEGATIVE NEGATIVE   Bilirubin Urine NEGATIVE NEGATIVE   Ketones, ur NEGATIVE NEGATIVE mg/dL   Protein, ur NEGATIVE NEGATIVE mg/dL   Nitrite NEGATIVE NEGATIVE   Leukocytes,Ua NEGATIVE NEGATIVE    MAU Course  Procedures None  MDM FHR - 159 bpm UA today without evidence of infection or dehydration   Assessment and Plan  A: SIUP at 36w2dRound ligament pain   P:  Discharge home Tylenol PRN for pain advised  Discussed regular use of abdominal binder and hydrotherapy for pain management  Warning signs for worsening condition discussed Patient advised to follow-up with [facility] as scheduled for routine prenatal care or sooner PRN Patient may return to MAU as needed or if her condition were to change or worsen   JKerry Hough PA-C 06/07/2019, 3:42 PM

## 2019-06-07 NOTE — MAU Note (Signed)
Pt has been having pain in her lower abdomen bilaterally on and off and was seen at [facility] for that.  Was told to come back and be seen by midwife there if pain persists and she tried, but midwife was out sick.  She was told to be evaluated in MAU.  Denies vaginal bleeding or discharge.

## 2019-06-19 ENCOUNTER — Encounter: Payer: Self-pay | Admitting: Certified Nurse Midwife

## 2019-06-28 ENCOUNTER — Encounter: Payer: Self-pay | Admitting: Certified Nurse Midwife

## 2019-06-30 ENCOUNTER — Inpatient Hospital Stay (HOSPITAL_COMMUNITY)
Admission: AD | Admit: 2019-06-30 | Discharge: 2019-06-30 | Disposition: A | Discharge status: Home / Self Care | Payer: Medicaid Other | Attending: Obstetrics and Gynecology | Admitting: Obstetrics and Gynecology

## 2019-06-30 ENCOUNTER — Other Ambulatory Visit: Payer: Self-pay

## 2019-06-30 ENCOUNTER — Encounter (HOSPITAL_COMMUNITY): Payer: Self-pay | Admitting: *Deleted

## 2019-06-30 DIAGNOSIS — Z87891 Personal history of nicotine dependence: Secondary | ICD-10-CM | POA: Diagnosis not present

## 2019-06-30 DIAGNOSIS — Z3A16 16 weeks gestation of pregnancy: Secondary | ICD-10-CM | POA: Insufficient documentation

## 2019-06-30 DIAGNOSIS — B373 Candidiasis of vulva and vagina: Secondary | ICD-10-CM | POA: Diagnosis not present

## 2019-06-30 DIAGNOSIS — N898 Other specified noninflammatory disorders of vagina: Secondary | ICD-10-CM | POA: Diagnosis present

## 2019-06-30 DIAGNOSIS — O98812 Other maternal infectious and parasitic diseases complicating pregnancy, second trimester: Secondary | ICD-10-CM | POA: Diagnosis not present

## 2019-06-30 DIAGNOSIS — B3731 Acute candidiasis of vulva and vagina: Secondary | ICD-10-CM

## 2019-06-30 LAB — URINALYSIS, ROUTINE W REFLEX MICROSCOPIC
Bilirubin Urine: NEGATIVE
Glucose, UA: NEGATIVE mg/dL
Hgb urine dipstick: NEGATIVE
Ketones, ur: NEGATIVE mg/dL
Nitrite: NEGATIVE
Protein, ur: NEGATIVE mg/dL
Specific Gravity, Urine: 1.014 (ref 1.005–1.030)
pH: 8 (ref 5.0–8.0)

## 2019-06-30 LAB — WET PREP, GENITAL
Sperm: NONE SEEN
Trich, Wet Prep: NONE SEEN
Yeast Wet Prep HPF POC: NONE SEEN

## 2019-06-30 MED ORDER — TERCONAZOLE 0.8 % VA CREA: 1.0000 | TOPICAL_CREAM | Freq: Every day | VAGINAL | 0 refills | Status: DC

## 2019-06-30 NOTE — MAU Provider Note (Signed)
Chief Complaint: Vaginal Discharge   First Provider Initiated Contact with Patient 06/30/19 1254     SUBJECTIVE HPI: Andrea Jenkins is a 23 y.o. G2P1001 at 34w4dwho presents to Maternity Admissions reporting vaginal discharge. Reports episode of mucoid discharge with streaks of blood. Bleeding has not continued & was brown in appearance. Denies abdominal pain. Last intercourse was on Thursday.    Past Medical History:  Diagnosis Date  . Asthma    pt states she doesn't use inhaler much (04/10/19)  . Gonorrhea   . Heart murmur    when younger  . Vaginal Pap smear, abnormal    OB History  Gravida Para Term Preterm AB Living  '2 1 1 '$ 0 0 1  SAB TAB Ectopic Multiple Live Births  0 0 0 0 1    # Outcome Date GA Lbr Len/2nd Weight Sex Delivery Anes PTL Lv  2 Current           1 Term 01/18/18 [id number] / 00:18 3365 g F Vag-Spont EPI  LIV   Past Surgical History:  Procedure Laterality Date  . CATARACT EXTRACTION    . WISDOM TOOTH EXTRACTION    . WRIST SURGERY     Social History   Socioeconomic History  . Marital status: Single    Spouse name: Not on file  . Number of children: Not on file  . Years of education: Not on file  . Highest education level: Not on file  Occupational History  . Not on file  Social Needs  . Financial resource strain: Not on file  . Food insecurity    Worry: Not on file    Inability: Not on file  . Transportation needs    Medical: Not on file    Non-medical: Not on file  Tobacco Use  . Smoking status: Former Smoker    Packs/day: 0.25    Types: Cigars    Quit date: 03/11/2017    Years since quitting: 2.3  . Smokeless tobacco: Never Used  Substance and Sexual Activity  . Alcohol use: No    Alcohol/week: 0.0 standard drinks    Comment: mimosa on saturday. 04/06/19  . Drug use: No  . Sexual activity: Yes    Partners: Male    Birth control/protection: None  Lifestyle  . Physical activity    Days per week: Not on file    Minutes per  session: Not on file  . Stress: Not on file  Relationships  . Social coHerbalistn phone: Not on file    Gets together: Not on file    Attends religious service: Not on file    Active member of club or organization: Not on file    Attends meetings of clubs or organizations: Not on file    Relationship status: Not on file  . Intimate partner violence    Fear of current or ex partner: Not on file    Emotionally abused: Not on file    Physically abused: Not on file    Forced sexual activity: Not on file  Other Topics Concern  . Not on file  Social History Narrative  . Not on file   Family History  Problem Relation Age of Onset  . Hypertension Mother   . Miscarriages / St[place]other   . Hypertension Maternal Grandmother   . Asthma Maternal Grandmother   . Diabetes Maternal Grandmother   . Vision loss Maternal Grandmother   . Mental illness Brother   .  Cancer Maternal Aunt   . Heart disease Neg Hx    No current facility-administered medications on file prior to encounter.    Current Outpatient Medications on File Prior to Encounter  Medication Sig Dispense Refill  . Blood Pressure KIT Monitor BP readings at home regularly O09.90 large 1 kit 0  . Elastic Bandages & Supports (COMFORT FIT MATERNITY SUPP LG) MISC 1 Units by Does not apply route daily. 1 each 0  . Elastic Bandages & Supports (COMFORT FIT MATERNITY SUPP SM) MISC 1 Units by Does not apply route daily as needed. (Patient not taking: Reported on 05/30/2019) 1 each 0  . Prenat w/o A-FeCbn-Meth-FA-DHA (PRENATE MINI) 29-0.6-0.4-350 MG CAPS Take 1 capsule by mouth daily before breakfast. 30 capsule 11   Allergies  Allergen Reactions  . Peach [Prunus Persica] Hives    I have reviewed patient's Past Medical Hx, Surgical Hx, Family Hx, Social Hx, medications and allergies.   Review of Systems  Constitutional: Negative.   Gastrointestinal: Negative.   Genitourinary: Positive for vaginal bleeding and vaginal  discharge. Negative for dysuria.    OBJECTIVE Patient Vitals for the past 24 hrs:  BP Temp Temp src Pulse Resp SpO2 Height Weight  06/30/19 1408 (!) 104/59 - - 72 16 - - -  06/30/19 1155 100/64 98.4 F (36.9 C) Oral 72 17 100 % - -  06/30/19 1151 - - - - - - 5' 3.5" (1.613 m) 77.7 kg   Constitutional: Well-developed, well-nourished female in no acute distress.  Cardiovascular: normal rate & rhythm, no murmur Respiratory: normal rate and effort. Lung sounds clear throughout GI: Abd soft, non-tender, Pos BS x 4. No guarding or rebound tenderness MS: Extremities nontender, no edema, normal ROM Neurologic: Alert and oriented x 4.  GU:     SPECULUM EXAM: NEFG, moderate amount of white clumpy discharge. No blood  BIMANUAL: No CMT. cervix closed/thick   LAB RESULTS Results for orders placed or performed during the hospital encounter of 06/30/19 (from the past 24 hour(s))  Urinalysis, Routine w reflex microscopic     Status: Abnormal   Collection Time: 06/30/19  1:04 PM  Result Value Ref Range   Color, Urine YELLOW YELLOW   APPearance HAZY (A) CLEAR   Specific Gravity, Urine 1.014 1.005 - 1.030   pH 8.0 5.0 - 8.0   Glucose, UA NEGATIVE NEGATIVE mg/dL   Hgb urine dipstick NEGATIVE NEGATIVE   Bilirubin Urine NEGATIVE NEGATIVE   Ketones, ur NEGATIVE NEGATIVE mg/dL   Protein, ur NEGATIVE NEGATIVE mg/dL   Nitrite NEGATIVE NEGATIVE   Leukocytes,Ua SMALL (A) NEGATIVE   RBC / HPF 0-5 0 - 5 RBC/hpf   WBC, UA 0-5 0 - 5 WBC/hpf   Bacteria, UA RARE (A) NONE SEEN   Squamous Epithelial / LPF 6-10 0 - 5   Mucus PRESENT   Wet prep, genital     Status: Abnormal   Collection Time: 06/30/19  1:33 PM  Result Value Ref Range   Yeast Wet Prep HPF POC NONE SEEN NONE SEEN   Trich, Wet Prep NONE SEEN NONE SEEN   Clue Cells Wet Prep HPF POC PRESENT (A) NONE SEEN   WBC, Wet Prep HPF POC FEW (A) NONE SEEN   Sperm NONE SEEN     IMAGING No results found.  MAU COURSE Orders Placed This Encounter   Procedures  . Wet prep, genital  . Urinalysis, Routine w reflex microscopic  . Discharge patient   Meds ordered this encounter  Medications  .  terconazole (TERAZOL 3) 0.8 % vaginal cream    Sig: Place 1 applicator vaginally at bedtime.    Dispense:  20 g    Refill:  0    Order Specific Question:   Supervising Provider    Answer:   Aletha Halim [[id number]]    MDM FHT present via doppler  Cervix closed/thick  Exam consistent with yeast infection. Wet prep & GC/CT collected  ASSESSMENT 1. Vaginal yeast infection   2. [redacted] weeks gestation of pregnancy     PLAN Discharge home in stable condition. Discussed reasons to return to MAU Rx terazol GC/CT pending  Follow-up Information    [facility] Follow up.   Specialty: Obstetrics and Gynecology Why: return for worsening symptoms Contact information: [address] [id number] [place] [phone]         Allergies as of 06/30/2019      Reactions   Peach [prunus Persica] Hives      Medication List    TAKE these medications   Blood Pressure Kit Monitor BP readings at home regularly O09.90 large   Wisdom 1 Units by Does not apply route daily as needed.   Comfort Fit Maternity Supp Lg Misc 1 Units by Does not apply route daily.   Prenate Mini 29-0.6-0.4-350 MG Caps Take 1 capsule by mouth daily before breakfast.   terconazole 0.8 % vaginal cream Commonly known as: Terazol 3 Place 1 applicator vaginally at bedtime.        Jorje Guild, NP 06/30/2019  3:30 PM

## 2019-06-30 NOTE — MAU Note (Signed)
Andrea Jenkins is a 23 y.o. at [redacted]w[redacted]d here in MAU reporting: woke up this AM and used the restroom, states when she wiped it looked like her mucus plug came out and it had blood in it. Has not seen more since this one time. No pain. Last IC was Thursday.   Onset of complaint: this morning  Pain score: 0/10  Vitals:   06/30/19 1155  BP: 100/64  Pulse: 72  Resp: 17  Temp: 98.4 F (36.9 C)  SpO2: 100%     FHT:150  Lab orders placed from triage: UA

## 2019-06-30 NOTE — Discharge Instructions (Signed)
Vaginal Yeast infection, Adult  Vaginal yeast infection is a condition that causes vaginal discharge as well as soreness, swelling, and redness (inflammation) of the vagina. This is a common condition. Some women get this infection frequently. What are the causes? This condition is caused by a change in the normal balance of the yeast (candida) and bacteria that live in the vagina. This change causes an overgrowth of yeast, which causes the inflammation. What increases the risk? The condition is more likely to develop in women who:  Take antibiotic medicines.  Have diabetes.  Take birth control pills.  Are pregnant.  Douche often.  Have a weak body defense system (immune system).  Have been taking steroid medicines for a long time.  Frequently wear tight clothing. What are the signs or symptoms? Symptoms of this condition include:  White, thick, creamy vaginal discharge.  Swelling, itching, redness, and irritation of the vagina. The lips of the vagina (vulva) may be affected as well.  Pain or a burning feeling while urinating.  Pain during sex. How is this diagnosed? This condition is diagnosed based on:  Your medical history.  A physical exam.  A pelvic exam. Your health care provider will examine a sample of your vaginal discharge under a microscope. Your health care provider may send this sample for testing to confirm the diagnosis. How is this treated? This condition is treated with medicine. Medicines may be over-the-counter or prescription. You may be told to use one or more of the following:  Medicine that is taken by mouth (orally).  Medicine that is applied as a cream (topically).  Medicine that is inserted directly into the vagina (suppository). Follow these instructions at home:  Lifestyle  Do not have sex until your health care provider approves. Tell your sex partner that you have a yeast infection. That person should go to his or her health care  provider and ask if they should also be treated.  Do not wear tight clothes, such as pantyhose or tight pants.  Wear breathable cotton underwear. General instructions  Take or apply over-the-counter and prescription medicines only as told by your health care provider.  Eat more yogurt. This may help to keep your yeast infection from returning.  Do not use tampons until your health care provider approves.  Try taking a sitz bath to help with discomfort. This is a warm water bath that is taken while you are sitting down. The water should only come up to your hips and should cover your buttocks. Do this 3-4 times per day or as told by your health care provider.  Do not douche.  If you have diabetes, keep your blood sugar levels under control.  Keep all follow-up visits as told by your health care provider. This is important. Contact a health care provider if:  You have a fever.  Your symptoms go away and then return.  Your symptoms do not get better with treatment.  Your symptoms get worse.  You have new symptoms.  You develop blisters in or around your vagina.  You have blood coming from your vagina and it is not your menstrual period.  You develop pain in your abdomen. Summary  Vaginal yeast infection is a condition that causes discharge as well as soreness, swelling, and redness (inflammation) of the vagina.  This condition is treated with medicine. Medicines may be over-the-counter or prescription.  Take or apply over-the-counter and prescription medicines only as told by your health care provider.  Do not douche.   Do not have sex or use tampons until your health care provider approves.  Contact a health care provider if your symptoms do not get better with treatment or your symptoms go away and then return. This information is not intended to replace advice given to you by your health care provider. Make sure you discuss any questions you have with your health care  provider. Document Released: 07/21/2005 Document Revised: 02/27/2018 Document Reviewed: 02/27/2018 Elsevier Patient Education  2020 [employer]  

## 2019-07-04 ENCOUNTER — Telehealth: Payer: Self-pay

## 2019-07-04 LAB — GC/CHLAMYDIA PROBE AMP (~~LOC~~) NOT AT ARMC
Chlamydia: NEGATIVE
Neisseria Gonorrhea: NEGATIVE

## 2019-07-04 NOTE — Telephone Encounter (Signed)
Patient called and reports that she has a decreased appetite and has been losing weight. Pt reports she has taken the nausea medicine that she got when she went to the hospital but this is not helping. Pt would like to know if there's anything she can take to increase her appetite.

## 2019-07-06 ENCOUNTER — Other Ambulatory Visit: Payer: Self-pay

## 2019-07-06 DIAGNOSIS — Z3480 Encounter for supervision of other normal pregnancy, unspecified trimester: Secondary | ICD-10-CM

## 2019-07-06 MED ORDER — VITAFOL ULTRA 29-0.6-0.4-200 MG PO CAPS: 1.0000 | ORAL_CAPSULE | Freq: Every day | ORAL | 12 refills | Status: DC

## 2019-07-06 NOTE — Telephone Encounter (Signed)
LVM for patient to call back regarding recommendation.

## 2019-07-06 NOTE — Progress Notes (Signed)
New prenatal vitamin sent to pharmacy, pt reports she cannot handle the smell of the one's she is currently taking.

## 2019-07-12 ENCOUNTER — Other Ambulatory Visit: Payer: Self-pay

## 2019-07-12 ENCOUNTER — Ambulatory Visit (INDEPENDENT_AMBULATORY_CARE_PROVIDER_SITE_OTHER): Payer: Medicaid Other | Admitting: Certified Nurse Midwife

## 2019-07-12 ENCOUNTER — Encounter: Payer: Self-pay | Admitting: Certified Nurse Midwife

## 2019-07-12 DIAGNOSIS — Z3482 Encounter for supervision of other normal pregnancy, second trimester: Secondary | ICD-10-CM

## 2019-07-12 DIAGNOSIS — Z3480 Encounter for supervision of other normal pregnancy, unspecified trimester: Secondary | ICD-10-CM | POA: Diagnosis not present

## 2019-07-12 DIAGNOSIS — Z23 Encounter for immunization: Secondary | ICD-10-CM | POA: Diagnosis not present

## 2019-07-12 DIAGNOSIS — Z3A18 18 weeks gestation of pregnancy: Secondary | ICD-10-CM

## 2019-07-12 MED ORDER — BLOOD PRESSURE KIT: PACK | 0 refills | Status: DC

## 2019-07-12 NOTE — Progress Notes (Signed)
   PRENATAL VISIT NOTE  Subjective:  Andrea Jenkins is a 23 y.o. G2P1001 at 24w2dbeing seen today for ongoing prenatal care.  She is currently monitored for the following issues for this low-risk pregnancy and has LGSIL on Pap smear of cervix; Blindness of right eye; Supervision of other normal pregnancy, antepartum; and Short interval between pregnancies affecting pregnancy, antepartum on their problem list.  Patient reports no complaints.  Contractions: Not present. Vag. Bleeding: None.   . Denies leaking of fluid.   The following portions of the patient's history were reviewed and updated as appropriate: allergies, current medications, past family history, past medical history, past social history, past surgical history and problem list.   Objective:   Vitals:   07/12/19 1026  BP: (!) 108/58  Pulse: 71  Weight: 172 lb (78 kg)    Fetal Status:           General:  Alert, oriented and cooperative. Patient is in no acute distress.  Skin: Skin is warm and dry. No rash noted.   Cardiovascular: Normal heart rate noted  Respiratory: Normal respiratory effort, no problems with respiration noted  Abdomen: Soft, gravid, appropriate for gestational age.  Pain/Pressure: Present     Pelvic: Cervical exam deferred        Extremities: Normal range of motion.  Edema: None  Mental Status: Normal mood and affect. Normal behavior. Normal judgment and thought content.   Assessment and Plan:  Pregnancy: G2P1001 at 138w2d. Supervision of other normal pregnancy, antepartum - Patient doing well, no complaints - Reports she is unable to feel fetal movement at this time, educated and discussed she should start feeling movement between now and end of 19 weeks- patient verbalizes understanding  - Routine prenatal care, AFP obtained today  - Anticipatory guidance on upcoming appointments  - Rx for BP cuff resent  - AFP, Serum, Open Spina Bifida - Flu Vaccine QUAD 36+ mos IM (Fluarix, Quad PF) -  Blood Pressure KIT; Monitor BP readings at home regularly.  Dx O09.90   Large Cuff  Dispense: 1 kit; Refill: 0  Preterm labor symptoms and general obstetric precautions including but not limited to vaginal bleeding, contractions, leaking of fluid and fetal movement were reviewed in detail with the patient. Please refer to After Visit Summary for other counseling recommendations.   Return in about 6 weeks (around 08/23/2019) for ROB-mychart.  Future Appointments  Date Time Provider [place]9/22/2020 10:15 AM [place] [facility]-M[facility] MFC-US  08/23/2019 10:30 AM HaShelly BombardMD [facility]    VeLajean ManesCNM

## 2019-07-12 NOTE — Patient Instructions (Signed)

## 2019-07-12 NOTE — Progress Notes (Addendum)
ROB/AFP.  BP Cuff reordered thru [employer].  Reports no problems today. FLU vaccine given in LD, tolerated well.

## 2019-07-14 LAB — AFP, SERUM, OPEN SPINA BIFIDA
AFP MoM: 1.3
AFP Value: 57.2 ng/mL
Gest. Age on Collection Date: 18.2 weeks
Maternal Age At EDD: 22.1 yr
OSBR Risk 1 IN: 9606
Test Results:: NEGATIVE
Weight: 172 [lb_av]

## 2019-07-17 ENCOUNTER — Other Ambulatory Visit: Payer: Self-pay

## 2019-07-17 ENCOUNTER — Telehealth: Payer: Self-pay

## 2019-07-17 ENCOUNTER — Ambulatory Visit (HOSPITAL_COMMUNITY)
Admission: RE | Admit: 2019-07-17 | Discharge: 2019-07-17 | Disposition: A | Discharge status: Home / Self Care | Payer: Medicaid Other | Source: Ambulatory Visit | Attending: Certified Nurse Midwife | Admitting: Certified Nurse Midwife

## 2019-07-17 ENCOUNTER — Other Ambulatory Visit: Payer: Self-pay | Admitting: Certified Nurse Midwife

## 2019-07-17 DIAGNOSIS — O43192 Other malformation of placenta, second trimester: Secondary | ICD-10-CM

## 2019-07-17 DIAGNOSIS — Z3A19 19 weeks gestation of pregnancy: Secondary | ICD-10-CM

## 2019-07-17 DIAGNOSIS — O9989 Other specified diseases and conditions complicating pregnancy, childbirth and the puerperium: Secondary | ICD-10-CM | POA: Diagnosis not present

## 2019-07-17 DIAGNOSIS — O359XX0 Maternal care for (suspected) fetal abnormality and damage, unspecified, not applicable or unspecified: Secondary | ICD-10-CM

## 2019-07-17 DIAGNOSIS — Z148 Genetic carrier of other disease: Secondary | ICD-10-CM | POA: Diagnosis not present

## 2019-07-17 DIAGNOSIS — J45909 Unspecified asthma, uncomplicated: Secondary | ICD-10-CM | POA: Diagnosis not present

## 2019-07-17 DIAGNOSIS — Z3480 Encounter for supervision of other normal pregnancy, unspecified trimester: Secondary | ICD-10-CM

## 2019-07-17 NOTE — Telephone Encounter (Signed)
Patient called and wants to discuss her [place] results with the provider. She is concerned that her genetic testing results came back "low risk" but states that today at her anatomy scan they told her there was some concern that baby has down syndrome. Pt would like a call back. I advised patient I would let the provider know but that I cannot interpret an ultrasound. Pt verbalizes understanding.

## 2019-07-18 ENCOUNTER — Other Ambulatory Visit (HOSPITAL_COMMUNITY): Payer: Self-pay | Admitting: *Deleted

## 2019-07-18 DIAGNOSIS — Z362 Encounter for other antenatal screening follow-up: Secondary | ICD-10-CM

## 2019-07-19 ENCOUNTER — Encounter: Payer: Self-pay | Admitting: Certified Nurse Midwife

## 2019-07-19 ENCOUNTER — Telehealth: Payer: Self-pay | Admitting: Certified Nurse Midwife

## 2019-07-19 DIAGNOSIS — O43199 Other malformation of placenta, unspecified trimester: Secondary | ICD-10-CM | POA: Insufficient documentation

## 2019-07-19 NOTE — Telephone Encounter (Signed)
Called patient to discuss questions about ultrasound, no answer unable to leave voicemail - busy signal after no answer   Lajean Manes, CNM 07/19/19, 9:22 AM

## 2019-07-20 DIAGNOSIS — O0990 Supervision of high risk pregnancy, unspecified, unspecified trimester: Secondary | ICD-10-CM | POA: Diagnosis not present

## 2019-08-08 ENCOUNTER — Other Ambulatory Visit: Payer: Self-pay

## 2019-08-08 ENCOUNTER — Emergency Department (HOSPITAL_COMMUNITY)
Admission: AD | Admit: 2019-08-08 | Discharge: 2019-08-09 | Discharge status: Against Medical Advice | Payer: Medicaid Other | Attending: Emergency Medicine | Admitting: Emergency Medicine

## 2019-08-08 DIAGNOSIS — Z5321 Procedure and treatment not carried out due to patient leaving prior to being seen by health care provider: Secondary | ICD-10-CM | POA: Insufficient documentation

## 2019-08-08 DIAGNOSIS — R519 Headache, unspecified: Secondary | ICD-10-CM | POA: Diagnosis present

## 2019-08-08 LAB — URINALYSIS, ROUTINE W REFLEX MICROSCOPIC
Bilirubin Urine: NEGATIVE
Glucose, UA: NEGATIVE mg/dL
Hgb urine dipstick: NEGATIVE
Ketones, ur: NEGATIVE mg/dL
Leukocytes,Ua: NEGATIVE
Nitrite: NEGATIVE
Protein, ur: NEGATIVE mg/dL
Specific Gravity, Urine: 1.015 (ref 1.005–1.030)
pH: 8 (ref 5.0–8.0)

## 2019-08-08 NOTE — MAU Note (Addendum)
Pt reports to MAU c/o a fall on Saturday. Pt states she hit her head really hard and made her cry pt states she only hit her head and not her abdomen. Pt states it was it was a hard floor. Pt reports she hit the back of her head. Pt denies seeking care after her fall. Pt states her HA has not gone away since the fall. Pt reports the pain is a 7/10 and it is not relieving with tylenol. Pt denies vaginal bleeding or LOF. Pt reports +FM just not as frequently since she fell.   Talked to Mady Gemma in [place] and gave report. Pt to be transported.

## 2019-08-08 NOTE — MAU Note (Signed)
Transport notified  

## 2019-08-08 NOTE — MAU Provider Note (Signed)
First Provider Initiated Contact with Patient 08/08/19 2326     S [name] is a 24 y.o. G74P1001 [redacted]w[redacted]d pregnant female who presents to MAU today with complaint of HA after fall on 08/04/2019. She reports falling backwards and slipped backwards and hitting her head across the hardwood floors. She reports having a HA since hitting her head, has taken Tylenol for HA without relief. She denies pregnancy complaints or concerns. She denies abdominal trauma, vaginal bleeding, vaginal discharge or abdominal pain.    O BP 114/63 (BP Location: Right Arm)   Pulse 82   Temp 98.1 F (36.7 C) (Oral)   Resp 16   Ht 5' 3.5" (1.613 m)   Wt 80 kg   LMP 03/06/2019 (Exact Date)   BMI 30.76 kg/m  Physical Exam  Constitutional: She is oriented to person, place, and time. She appears well-developed and well-nourished. No distress.  Cardiovascular: Normal rate and regular rhythm.  GI: There is no abdominal tenderness. There is no rebound and no guarding.  Gravid appropriate for gestational age  Musculoskeletal: Normal range of motion.        General: No edema.  Neurological: She is alert and oriented to person, place, and time.  Psychiatric: She has a normal mood and affect. Her behavior is normal. Thought content normal.   FHR 140 by doppler   A [redacted] weeks gestational  Head trauma after fall  Medical screening exam complete  P Discharge from MAU in stable condition- pregnancy cleared prior to transfer Patient transferred to [facility] for head trauma and HA, Dr Dina Rich accepted transfer.  Patient may return to MAU as needed for pregnancy related complaints  Lajean Manes, CNM 08/08/2019 11:29 PM

## 2019-08-09 ENCOUNTER — Other Ambulatory Visit: Payer: Self-pay

## 2019-08-09 ENCOUNTER — Encounter (HOSPITAL_COMMUNITY): Payer: Self-pay

## 2019-08-09 NOTE — ED Triage Notes (Signed)
Pt from MAU for evaluation of fall that occurred on Saturday, pt hit the back of her head and has been having headaches ever since. Pt [redacted] weeks pregnant. Pt a.o, nad noted

## 2019-08-10 ENCOUNTER — Encounter (HOSPITAL_COMMUNITY): Payer: Self-pay

## 2019-08-10 ENCOUNTER — Ambulatory Visit (INDEPENDENT_AMBULATORY_CARE_PROVIDER_SITE_OTHER): Payer: Medicaid Other | Admitting: Obstetrics

## 2019-08-10 ENCOUNTER — Encounter: Payer: Self-pay | Admitting: Obstetrics

## 2019-08-10 ENCOUNTER — Inpatient Hospital Stay (HOSPITAL_COMMUNITY)
Admission: AD | Admit: 2019-08-10 | Discharge: 2019-08-10 | Discharge status: Against Medical Advice | Payer: Medicaid Other | Attending: Obstetrics and Gynecology | Admitting: Obstetrics and Gynecology

## 2019-08-10 ENCOUNTER — Other Ambulatory Visit: Payer: Self-pay

## 2019-08-10 VITALS — BP 113/60 | HR 73 | Wt 173.4 lb

## 2019-08-10 DIAGNOSIS — O43192 Other malformation of placenta, second trimester: Secondary | ICD-10-CM | POA: Diagnosis not present

## 2019-08-10 DIAGNOSIS — O99512 Diseases of the respiratory system complicating pregnancy, second trimester: Secondary | ICD-10-CM | POA: Insufficient documentation

## 2019-08-10 DIAGNOSIS — O09899 Supervision of other high risk pregnancies, unspecified trimester: Secondary | ICD-10-CM

## 2019-08-10 DIAGNOSIS — Z3A22 22 weeks gestation of pregnancy: Secondary | ICD-10-CM | POA: Insufficient documentation

## 2019-08-10 DIAGNOSIS — O43199 Other malformation of placenta, unspecified trimester: Secondary | ICD-10-CM

## 2019-08-10 DIAGNOSIS — Z87891 Personal history of nicotine dependence: Secondary | ICD-10-CM | POA: Insufficient documentation

## 2019-08-10 DIAGNOSIS — O26892 Other specified pregnancy related conditions, second trimester: Secondary | ICD-10-CM | POA: Insufficient documentation

## 2019-08-10 DIAGNOSIS — J45909 Unspecified asthma, uncomplicated: Secondary | ICD-10-CM | POA: Insufficient documentation

## 2019-08-10 DIAGNOSIS — O0992 Supervision of high risk pregnancy, unspecified, second trimester: Secondary | ICD-10-CM

## 2019-08-10 DIAGNOSIS — R519 Headache, unspecified: Secondary | ICD-10-CM | POA: Diagnosis not present

## 2019-08-10 DIAGNOSIS — O09892 Supervision of other high risk pregnancies, second trimester: Secondary | ICD-10-CM

## 2019-08-10 DIAGNOSIS — S060X0A Concussion without loss of consciousness, initial encounter: Secondary | ICD-10-CM

## 2019-08-10 DIAGNOSIS — O0990 Supervision of high risk pregnancy, unspecified, unspecified trimester: Secondary | ICD-10-CM

## 2019-08-10 DIAGNOSIS — S060X0S Concussion without loss of consciousness, sequela: Secondary | ICD-10-CM

## 2019-08-10 LAB — URINALYSIS, ROUTINE W REFLEX MICROSCOPIC
Bilirubin Urine: NEGATIVE
Glucose, UA: NEGATIVE mg/dL
Hgb urine dipstick: NEGATIVE
Ketones, ur: NEGATIVE mg/dL
Leukocytes,Ua: NEGATIVE
Nitrite: NEGATIVE
Protein, ur: NEGATIVE mg/dL
Specific Gravity, Urine: 1.014 (ref 1.005–1.030)
pH: 7 (ref 5.0–8.0)

## 2019-08-10 MED ORDER — DIPHENHYDRAMINE HCL 50 MG/ML IJ SOLN
12.5000 mg | Freq: Once | INTRAMUSCULAR | Status: AC
  Administered 2019-08-10: 13:00:00 12.5 mg via INTRAVENOUS
  Filled 2019-08-10: qty 1

## 2019-08-10 MED ORDER — DEXAMETHASONE SODIUM PHOSPHATE 10 MG/ML IJ SOLN
10.0000 mg | Freq: Once | INTRAMUSCULAR | Status: AC
  Administered 2019-08-10: 10 mg via INTRAVENOUS
  Filled 2019-08-10: qty 1

## 2019-08-10 MED ORDER — BUTALBITAL-APAP-CAFFEINE 50-325-40 MG PO TABS
2.0000 | ORAL_TABLET | Freq: Once | ORAL | Status: AC
  Administered 2019-08-10: 11:00:00 2 via ORAL
  Filled 2019-08-10: qty 2

## 2019-08-10 MED ORDER — LACTATED RINGERS IV BOLUS
1000.0000 mL | Freq: Once | INTRAVENOUS | Status: AC
  Administered 2019-08-10: 1000 mL via INTRAVENOUS

## 2019-08-10 MED ORDER — METOCLOPRAMIDE HCL 5 MG/ML IJ SOLN
10.0000 mg | Freq: Once | INTRAMUSCULAR | Status: AC
  Administered 2019-08-10: 13:00:00 10 mg via INTRAVENOUS
  Filled 2019-08-10: qty 2

## 2019-08-10 NOTE — MAU Note (Addendum)
Pt reports to MAU for headache that has been present since she fall on sat OCT 10th. She states that her whole head hurts and sometimes her eyes. Confirm fetal movement, denies bleeding.

## 2019-08-10 NOTE — MAU Note (Signed)
Pt has tried 1000 of tylenol with no relief

## 2019-08-10 NOTE — Progress Notes (Signed)
Patient reports fetal movement, reports that she fell 08/04/19 and hit the back of her head. Pt has been having headaches ever since and reports dizziness and blurred vision when standing too fast. Pt also reports increased pelvic pressure since the fall. Pt went to ED the day of the fall but left due to being told there would be a 7hr wait. Pt states she has tried 1,000mg  of Tylenol and still no relief.

## 2019-08-10 NOTE — Progress Notes (Signed)
TELEHEALTH OBSTETRICS PRENATAL VIRTUAL VIDEO VISIT ENCOUNTER NOTE  Provider location: [facility] [place]   I connected with Andrea [facility] on 08/10/19 at  9:15 AM EDT by OB MyChart Video Encounter at home and verified that I am speaking with the correct person using two identifiers.   I discussed the limitations, risks, security and privacy concerns of performing an evaluation and management service virtually and the availability of in person appointments. I also discussed with the patient that there may be a patient responsible charge related to this service. The patient expressed understanding and agreed to proceed. Subjective:  Andrea [facility] is a 23 y.o. G2P1001 at [redacted]w[redacted]d being seen today for ongoing prenatal care.  She is currently monitored for the following issues for this high-risk pregnancy and has LGSIL on Pap smear of cervix; Blindness of right eye; Supervision of other normal pregnancy, antepartum; Short interval between pregnancies affecting pregnancy, antepartum; and Marginal insertion of umbilical cord affecting management of mother on their problem list.  Patient reports nausea and vomiting.  Contractions: Not present. Vag. Bleeding: None.  Movement: Present. Denies any leaking of fluid.   The following portions of the patient's history were reviewed and updated as appropriate: allergies, current medications, past family history, past medical history, past social history, past surgical history and problem list.   Objective:   Vitals:   08/10/19 0927  BP: 113/60  Pulse: 73  Weight: 173 lb 6.4 oz (78.7 kg)    Fetal Status:     Movement: Present     General:  Alert, oriented and cooperative. Patient is in no acute distress.  Respiratory: Normal respiratory effort, no problems with respiration noted  Mental Status: Normal mood and affect. Normal behavior. Normal judgment and thought content.  Rest of physical exam deferred due to type of  encounter  Imaging: [place] Mfm Ob Detail +14 Wk  Result Date: 07/17/2019 ----------------------------------------------------------------------  OBSTETRICS REPORT                       (Signed Final 07/17/2019 01:30 pm) ---------------------------------------------------------------------- Patient Info  ID #:       [id number]                          D.O.B.:  12/01/1995 (22 yrs)  Name:       Andrea [facility]               Visit Date: 07/17/2019 10:37 am ---------------------------------------------------------------------- Performed By  Performed By:     Tomma Lightning             Ref. Address:     Faculty                    RDMS,RVT  Attending:        Ma Rings MD         Location[facility]  Referred By:      Rudean Curt  ROGERS CNM ---------------------------------------------------------------------- Orders   #  Description                          Code         Ordered By   1  US MFM OB DETAIL +14 WK              [id number]     Steward DroneVERONICA ROGERS  ----------------------------------------------------------------------   #  Order #                    Accession #                 Episode #   1  [id number]                  [id number]                  [id number]  ---------------------------------------------------------------------- Indications   Fetal abnormality - other known or             O35.9XX0   suspected (LVEIF and RVEIF)   Marginal insertion of umbilical cord affecting O43.192   management of mother in second trimester   Encounter for antenatal screening for          Z36.3   malformations (low risk NIPS)   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 Cord Insertion:      Marginal insertion  Amniotic Fluid  AFI FV:      Within normal limits                              Largest Pocket(cm)                              3.1 ---------------------------------------------------------------------- Biometry  BPD:      41.5  mm     G. Age:  18w 4d         32  %    CI:        73.83   %    70 - 86  FL/HC:      18.2   %    16.1 - 18.3  HC:      153.4  mm     G. Age:  18w 2d         14  %    HC/AC:      1.10        1.09 - 1.39  AC:      139.8  mm     G. Age:  19w 3d         59  %    FL/BPD:     67.2   %  FL:       27.9  mm     G. Age:  18w 4d         27  %    FL/AC:      20.0   %    20 - 24  HUM:      25.9  mm     G. Age:  18w 1d         26  %  CER:      18.8  mm     G. Age:  18w 3d         32  %  NFT:       4.7  mm  LV:        6.3  mm  CM:        4.6  mm  Est. FW:     264  gm      0 lb 9 oz     40  % ---------------------------------------------------------------------- OB History  Gravidity:    2         Term:   1  Living:       1 ---------------------------------------------------------------------- Gestational Age  LMP:           19w 0d        Date:  03/06/19                 EDD:   12/11/19  U/S Today:     18w 5d                                        EDD:   12/13/19  Best:          19w 0d     Det. By:  LMP  (03/06/19)          EDD:   12/11/19 ---------------------------------------------------------------------- Anatomy  Cranium:               Appears normal         LVOT:                   Appears normal  Cavum:                 Appears normal         Aortic Arch:            Appears normal  Ventricles:            Appears normal          Ductal Arch:            Appears normal  Choroid Plexus:        Appears normal         Diaphragm:  Appears normal  Cerebellum:            Appears normal         Stomach:                Appears normal, left                                                                        sided  Posterior Fossa:       Appears normal         Abdomen:                Appears normal  Nuchal Fold:           Appears normal         Abdominal Wall:         Appears nml (cord                                                                        insert, abd wall)  Face:                  Appears normal         Cord Vessels:           Appears normal (3                         (orbits and profile)                           vessel cord)  Lips:                  Appears normal         Kidneys:                Appear normal  Palate:                Not well visualized    Bladder:                Appears normal  Thoracic:              Appears normal         Spine:                  Not well visualized  Heart:                 Echogenic focus        Upper Extremities:      Appears normal                         in LV and RV  RVOT:                  Appears normal         Lower Extremities:      Appears normal  Other:  Fetus appears to be a female.  Heels and 5th digit visualized. Nasal          bone visualized. Technically difficult due to fetal position. ---------------------------------------------------------------------- Cervix Uterus Adnexa  Cervix  Length:           3.09  cm.  Normal appearance by transabdominal scan.  Uterus  No abnormality visualized.  Left Ovary  Size(cm)       3.6  x   1.7    x  2         Vol(ml): 6.41  Within normal limits.  Right Ovary  Size(cm)       2.8  x   1.6    x  2.8       Vol(ml): 6.57  Within normal limits. ---------------------------------------------------------------------- Comments  This patient was seen for a detailed fetal anatomy scan.  She  denies any significant past medical history and  denies any  problems in her current pregnancy.  She had a cell free DNA test earlier in her pregnancy which  indicated a low risk for trisomy 19, 54, and 13. A female fetus is  predicted.  She was informed that the fetal growth and amniotic fluid  level were appropriate for her gestational age.  On today's exam, an intracardiac echogenic focus was noted  in the left and right ventricles of the fetal heart.  The small  association between an echogenic focus and Down  syndrome was discussed. Due to the echogenic focus noted  today, the patient was offered and declined an amniocentesis  today for definitive diagnosis of fetal aneuploidy.  She reports  that she is comfortable with her negative cell free DNA test.  The patient was informed that anomalies may be missed due  to technical limitations. If the fetus is in a suboptimal position  or maternal habitus is increased, visualization of the fetus in  the maternal uterus may be impaired.  A marginal placental cord insertion was also noted today.  The small association between fetal growth issues later in her  pregnancy and the marginal placental cord insertion was  discussed.  He was advised that we will continue to follow her  with serial growth ultrasound due to this finding.  A follow-up exam was scheduled in 4 weeks. ----------------------------------------------------------------------                   Ma Rings, MD Electronically Signed Final Report   07/17/2019 01:30 pm ----------------------------------------------------------------------   Assessment and Plan:  Pregnancy: G2P1001 at [redacted]w[redacted]d 1. Supervision of high risk pregnancy, antepartum  2. Short interval between pregnancies affecting pregnancy, antepartum  3. Marginal insertion of umbilical cord affecting management of mother - followed by MFM  4. Concussion with no loss of consciousness, sequela (HCC) - patient sent to [facility] for evaluation   Preterm labor symptoms and general obstetric  precautions including but not limited to vaginal bleeding, contractions, leaking of fluid and fetal movement were reviewed in detail with the patient. I discussed the assessment and treatment plan with the patient. The patient was provided an opportunity to ask questions and all were answered. The patient agreed with the plan and demonstrated an understanding of the instructions. The patient was advised to call back or seek an in-person office evaluation/go to [facility] at [facility] for any urgent or concerning symptoms. Please refer to After Visit Summary for other counseling recommendations.   I provided 15 minutes of face-to-face time during this encounter.  Return in about 4 weeks (around 09/07/2019) for MyChart HOB-Faculty Only.  Future Appointments  Date Time Provider Department [facility]  08/15/2019 10:45 AM [facility] [place] 2 [facility]  08/23/2019 10:30 AM Brock Bad, MD [facility] None    Coral Ceo, MD [facility] 08/10/2019
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
## 2019-08-10 NOTE — MAU Note (Signed)
Patient called RN in to room to say she needed to leave right away to pick up her daughter and that there was no one else she could call to help.  Eusebio Me, CNM was notified.  Patient signed out AMA and walked out in stable condition.  Pt was advised to go to ED if symptoms worsened.

## 2019-08-15 ENCOUNTER — Other Ambulatory Visit: Payer: Self-pay

## 2019-08-15 ENCOUNTER — Ambulatory Visit (HOSPITAL_COMMUNITY)
Admission: RE | Admit: 2019-08-15 | Discharge: 2019-08-15 | Disposition: A | Discharge status: Home / Self Care | Payer: Medicaid Other | Source: Ambulatory Visit | Attending: Internal Medicine | Admitting: Internal Medicine

## 2019-08-15 ENCOUNTER — Other Ambulatory Visit (HOSPITAL_COMMUNITY): Payer: Self-pay | Admitting: *Deleted

## 2019-08-15 DIAGNOSIS — O43199 Other malformation of placenta, unspecified trimester: Secondary | ICD-10-CM

## 2019-08-15 DIAGNOSIS — O359XX0 Maternal care for (suspected) fetal abnormality and damage, unspecified, not applicable or unspecified: Secondary | ICD-10-CM

## 2019-08-15 DIAGNOSIS — O43192 Other malformation of placenta, second trimester: Secondary | ICD-10-CM

## 2019-08-15 DIAGNOSIS — Z362 Encounter for other antenatal screening follow-up: Secondary | ICD-10-CM | POA: Diagnosis not present

## 2019-08-15 DIAGNOSIS — Z3A23 23 weeks gestation of pregnancy: Secondary | ICD-10-CM | POA: Diagnosis not present

## 2019-08-15 DIAGNOSIS — Z148 Genetic carrier of other disease: Secondary | ICD-10-CM

## 2019-08-23 ENCOUNTER — Encounter: Payer: Self-pay | Admitting: Obstetrics

## 2019-08-23 ENCOUNTER — Telehealth (INDEPENDENT_AMBULATORY_CARE_PROVIDER_SITE_OTHER): Payer: Medicaid Other | Admitting: Obstetrics

## 2019-08-23 DIAGNOSIS — O09899 Supervision of other high risk pregnancies, unspecified trimester: Secondary | ICD-10-CM

## 2019-08-23 DIAGNOSIS — O0990 Supervision of high risk pregnancy, unspecified, unspecified trimester: Secondary | ICD-10-CM

## 2019-08-23 DIAGNOSIS — O43192 Other malformation of placenta, second trimester: Secondary | ICD-10-CM

## 2019-08-23 DIAGNOSIS — O09892 Supervision of other high risk pregnancies, second trimester: Secondary | ICD-10-CM

## 2019-08-23 DIAGNOSIS — K219 Gastro-esophageal reflux disease without esophagitis: Secondary | ICD-10-CM

## 2019-08-23 DIAGNOSIS — O43199 Other malformation of placenta, unspecified trimester: Secondary | ICD-10-CM

## 2019-08-23 DIAGNOSIS — O0992 Supervision of high risk pregnancy, unspecified, second trimester: Secondary | ICD-10-CM

## 2019-08-23 DIAGNOSIS — Z3A24 24 weeks gestation of pregnancy: Secondary | ICD-10-CM

## 2019-08-23 MED ORDER — OMEPRAZOLE 20 MG PO CPDR: 20.0000 mg | DELAYED_RELEASE_CAPSULE | Freq: Two times a day (BID) | ORAL | 5 refills | Status: DC

## 2019-08-23 NOTE — Progress Notes (Signed)
TELEHEALTH OBSTETRICS PRENATAL VIRTUAL VIDEO VISIT ENCOUNTER NOTE  Provider location: [facility] [place]   I connected with Kathryne Hitch Zuelke on 08/23/19 at 10:30 AM EDT by OB MyChart Video Encounter at home and verified that I am speaking with the correct person using two identifiers.   I discussed the limitations, risks, security and privacy concerns of performing an evaluation and management service virtually and the availability of in person appointments. I also discussed with the patient that there may be a patient responsible charge related to this service. The patient expressed understanding and agreed to proceed. Subjective:  Andrea Jenkins is a 23 y.o. G2P1001 at [redacted]w[redacted]d being seen today for ongoing prenatal care.  She is currently monitored for the following issues for this high-risk pregnancy and has LGSIL on Pap smear of cervix; Blindness of right eye; Supervision of other normal pregnancy, antepartum; Short interval between pregnancies affecting pregnancy, antepartum; and Marginal insertion of umbilical cord affecting management of mother on their problem list.  Patient reports heartburn.  Contractions: Not present. Vag. Bleeding: None.  Movement: Present. Denies any leaking of fluid.   The following portions of the patient's history were reviewed and updated as appropriate: allergies, current medications, past family history, past medical history, past social history, past surgical history and problem list.   Objective:  There were no vitals filed for this visit.  Fetal Status:     Movement: Present     General:  Alert, oriented and cooperative. Patient is in no acute distress.  Respiratory: Normal respiratory effort, no problems with respiration noted  Mental Status: Normal mood and affect. Normal behavior. Normal judgment and thought content.  Rest of physical exam deferred due to type of encounter  Imaging: [place] Mfm Ob Follow Up  Result Date:  08/15/2019 ----------------------------------------------------------------------  OBSTETRICS REPORT                       (Signed Final 08/15/2019 12:34 pm) ---------------------------------------------------------------------- Patient Info  ID #:       [id number]                          D.O.B.:  [birth year] (22 yrs)  Name:       LORIA LACINA [facility]               Visit Date: 08/15/2019 11:11 am ---------------------------------------------------------------------- Performed By  Performed By:     [name]          Ref. Address:     Faculty                    RDMS  Attending:        Noralee Space MD        [facility]  Referred By:      Sharyon Cable CNM ---------------------------------------------------------------------- Orders   #  Description  Code         Ordered By   1  US MFM OB FOLLOW UP                  [id number]     Rosana HoesYU FANG  ----------------------------------------------------------------------   #  Order #                    Accession #                 Episode #   1  [id number]                  [id number]                  [id number]  ---------------------------------------------------------------------- Indications   Antenatal follow-up for nonvisualized fetal    Z36.2   anatomy   [redacted] weeks gestation of pregnancy                Z3A.23   Fetal abnormality - other known or             O35.9XX0   suspected (LVEIF and RVEIF)   Marginal insertion of umbilical cord affecting O43.192   management of mother in second trimester   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 Cord Insertion:      Marginal insertion prev. seen  Amniotic Fluid  AFI FV:      Within normal limits ---------------------------------------------------------------------- Biometry  BPD:      55.9  mm     G. Age:  23w 0d         42  %    CI:        72.57   %    70 - 86                                                          FL/HC:      18.3   %    19.2 - 20.8  HC:      208.7  mm     G. Age:  23w 0d         28  %    HC/AC:      1.21        1.05 - 1.21  AC:      172.7  mm     G. Age:  22w 2d         16  %    FL/BPD:     68.3   %    71 -  87  FL:       38.2  mm     G. Age:  22w 2d         14  %    FL/AC:      22.1   %    20 - 24  Est. FW:     495  gm      1 lb 1 oz     12  % ---------------------------------------------------------------------- OB History  Gravidity:    2         Term:   1  Living:       1 ---------------------------------------------------------------------- Gestational Age  LMP:           23w 1d        Date:  03/06/19                 EDD:   12/11/19  U/S Today:     22w 5d                                        EDD:   12/14/19  Best:          23w 1d     Det. By:  LMP  (03/06/19)          EDD:   12/11/19 ---------------------------------------------------------------------- Anatomy  Cranium:               Appears normal         Aortic Arch:            Appears normal  Cavum:                 Appears normal         Ductal Arch:            Appears normal  Ventricles:            Appears normal         Diaphragm:              Appears normal  Choroid Plexus:        Appears normal         Stomach:                Appears normal, left                                                                        sided  Cerebellum:            Appears normal         Abdomen:                Appears normal  Posterior Fossa:       Appears normal         Abdominal Wall:         Previously seen  Nuchal Fold:           Previously seen         Cord Vessels:           Previously seen  Face:  Orbits and profile     Kidneys:                Appear normal                         previously seen  Lips:                  Previously seen        Bladder:                Appears normal  Thoracic:              Appears normal         Spine:                  Not well visualized  Heart:                 Echogenic focus        Upper Extremities:      Previously seen                         in LV and RV  RVOT:                  Appears normal         Lower Extremities:      Previously seen  LVOT:                  Appears normal  Other:  Fetus appears to be a female.  Heels and 5th digit previously          visualized. Nasal bone previously visualized. Technically difficult due          to fetal position. ---------------------------------------------------------------------- Cervix Uterus Adnexa  Cervix  Length:              3  cm.  Normal appearance by transabdominal scan. ---------------------------------------------------------------------- Impression  Patient returned for completion of fetal anatomy. Fetal growth  is appropriate for gestational age. Amniotic fluid is normal  and good fetal activity is seen. Marginal cord insertion is seen  again. Fetal spine could not be evaluated because of fetal  position. Intracranial structures appear normal.  I explained the finding that marginal cord insertion is not  usually associated with fetal adverse outcomes. However, in  some cases, it can be associated with fetal growth restriction.  We recommend serial fetal growth assessments till delivery. ---------------------------------------------------------------------- Recommendations  -An appointment was made for her to return in 4 weeks for  fetal growth assessment and to evaluate fetal spine. ----------------------------------------------------------------------                  Noralee Space, MD Electronically Signed Final Report   08/15/2019 12:34 pm  ----------------------------------------------------------------------   Assessment and Plan:  Pregnancy: G2P1001 at [redacted]w[redacted]d 1. Supervision of high risk pregnancy, antepartum  2. Marginal insertion of umbilical cord affecting management of mother  3. Short interval between pregnancies affecting pregnancy, antepartum  4. GERD without esophagitis Rx: - omeprazole (PRILOSEC) 20 MG capsule; Take 1 capsule (20 mg total) by mouth 2 (two) times daily before a meal.  Dispense: 60 capsule; Refill: 5  Preterm labor symptoms and general obstetric precautions including but not limited to vaginal bleeding, contractions, leaking of fluid and fetal movement were reviewed in detail with the patient. I discussed the assessment and treatment plan with the patient. The patient was provided  an opportunity to ask questions and all were answered. The patient agreed with the plan and demonstrated an understanding of the instructions. The patient was advised to call back or seek an in-person office evaluation/go to [facility] at [facility] for any urgent or concerning symptoms. Please refer to After Visit Summary for other counseling recommendations.   I provided 10 minutes of face-to-face time during this encounter.  Return in about 4 weeks (around 09/20/2019) for [facility], 2 hour OGTT.  Future Appointments  Date Time Provider Department Center  09/12/2019  9:45 AM WH-MFC [place] 2 WH-MFCUS MFC-US    Coral Ceo, MD Center for [facility], [facility] 08/23/2019
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
## 2019-08-29 ENCOUNTER — Inpatient Hospital Stay (HOSPITAL_COMMUNITY)
Admission: AD | Admit: 2019-08-29 | Discharge: 2019-08-29 | Discharge status: Against Medical Advice | Payer: Medicaid Other | Attending: Obstetrics & Gynecology | Admitting: Obstetrics & Gynecology

## 2019-08-29 ENCOUNTER — Other Ambulatory Visit: Payer: Self-pay

## 2019-08-29 NOTE — Progress Notes (Signed)
Pt called again for eval, again no answer.  Will call again in 10-15 minutes

## 2019-08-29 NOTE — Progress Notes (Signed)
Pt called into triage for evaluation, no answer.  Will call again.

## 2019-08-29 NOTE — Progress Notes (Signed)
Pt called a 3rd time into triage no answer.  Registration informed RN that pt left.

## 2019-08-31 ENCOUNTER — Encounter: Payer: Self-pay | Admitting: Medical

## 2019-08-31 ENCOUNTER — Other Ambulatory Visit: Payer: Self-pay

## 2019-08-31 ENCOUNTER — Ambulatory Visit (INDEPENDENT_AMBULATORY_CARE_PROVIDER_SITE_OTHER): Payer: Medicaid Other | Admitting: Medical

## 2019-08-31 VITALS — BP 105/67 | HR 91 | Wt 182.0 lb

## 2019-08-31 DIAGNOSIS — Z3A25 25 weeks gestation of pregnancy: Secondary | ICD-10-CM

## 2019-08-31 DIAGNOSIS — O09899 Supervision of other high risk pregnancies, unspecified trimester: Secondary | ICD-10-CM

## 2019-08-31 DIAGNOSIS — Z3480 Encounter for supervision of other normal pregnancy, unspecified trimester: Secondary | ICD-10-CM

## 2019-08-31 DIAGNOSIS — O283 Abnormal ultrasonic finding on antenatal screening of mother: Secondary | ICD-10-CM

## 2019-08-31 DIAGNOSIS — O43192 Other malformation of placenta, second trimester: Secondary | ICD-10-CM

## 2019-08-31 DIAGNOSIS — O09892 Supervision of other high risk pregnancies, second trimester: Secondary | ICD-10-CM

## 2019-08-31 DIAGNOSIS — O43199 Other malformation of placenta, unspecified trimester: Secondary | ICD-10-CM

## 2019-08-31 NOTE — Patient Instructions (Addendum)
Second Trimester of Pregnancy  The second trimester is from week 14 through week 27 (month 4 through 6). This is often the time in pregnancy that you feel your best. Often times, morning sickness has lessened or quit. You may have more energy, and you may get hungry more often. Your unborn baby is growing rapidly. At the end of the sixth month, he or she is about 9 inches long and weighs about 1 pounds. You will likely feel the baby move between 18 and 20 weeks of pregnancy. Follow these instructions at home: Medicines  Take over-the-counter and prescription medicines only as told by your doctor. Some medicines are safe and some medicines are not safe during pregnancy.  Take a prenatal vitamin that contains at least 600 micrograms (mcg) of folic acid.  If you have trouble pooping (constipation), take medicine that will make your stool soft (stool softener) if your doctor approves. Eating and drinking   Eat regular, healthy meals.  Avoid raw meat and uncooked cheese.  If you get low calcium from the food you eat, talk to your doctor about taking a daily calcium supplement.  Avoid foods that are high in fat and sugars, such as fried and sweet foods.  If you feel sick to your stomach (nauseous) or throw up (vomit): ? Eat 4 or 5 small meals a day instead of 3 large meals. ? Try eating a few soda crackers. ? Drink liquids between meals instead of during meals.  To prevent constipation: ? Eat foods that are high in fiber, like fresh fruits and vegetables, whole grains, and beans. ? Drink enough fluids to keep your pee (urine) clear or pale yellow. Activity  Exercise only as told by your doctor. Stop exercising if you start to have cramps.  Do not exercise if it is too hot, too humid, or if you are in a place of great height (high altitude).  Avoid heavy lifting.  Wear low-heeled shoes. Sit and stand up straight.  You can continue to have sex unless your doctor tells you not to.  Relieving pain and discomfort  Wear a good support bra if your breasts are tender.  Take warm water baths (sitz baths) to soothe pain or discomfort caused by hemorrhoids. Use hemorrhoid cream if your doctor approves.  Rest with your legs raised if you have leg cramps or low back pain.  If you develop puffy, bulging veins (varicose veins) in your legs: ? Wear support hose or compression stockings as told by your doctor. ? Raise (elevate) your feet for 15 minutes, 3-4 times a day. ? Limit salt in your food. Prenatal care  Write down your questions. Take them to your prenatal visits.  Keep all your prenatal visits as told by your doctor. This is important. Safety  Wear your seat belt when driving.  Make a list of emergency phone numbers, including numbers for family, friends, the hospital, and police and fire departments. General instructions  Ask your doctor about the right foods to eat or for help finding a counselor, if you need these services.  Ask your doctor about local prenatal classes. Begin classes before month 6 of your pregnancy.  Do not use hot tubs, steam rooms, or saunas.  Do not douche or use tampons or scented sanitary pads.  Do not cross your legs for long periods of time.  Visit your dentist if you have not done so. Use a soft toothbrush to brush your teeth. Floss gently.  Avoid all smoking, herbs,   and alcohol. Avoid drugs that are not approved by your doctor.  Do not use any products that contain nicotine or tobacco, such as cigarettes and e-cigarettes. If you need help quitting, ask your doctor.  Avoid cat litter boxes and soil used by cats. These carry germs that can cause birth defects in the baby and can cause a loss of your baby (miscarriage) or stillbirth. Contact a doctor if:  You have mild cramps or pressure in your lower belly.  You have pain when you pee (urinate).  You have bad smelling fluid coming from your vagina.  You continue to feel  sick to your stomach (nauseous), throw up (vomit), or have watery poop (diarrhea).  You have a nagging pain in your belly area.  You feel dizzy. Get help right away if:  You have a fever.  You are leaking fluid from your vagina.  You have spotting or bleeding from your vagina.  You have severe belly cramping or pain.  You lose or gain weight rapidly.  You have trouble catching your breath and have chest pain.  You notice sudden or extreme puffiness (swelling) of your face, hands, ankles, feet, or legs.  You have not felt the baby move in over an hour.  You have severe headaches that do not go away when you take medicine.  You have trouble seeing. Summary  The second trimester is from week 14 through week 27 (months 4 through 6). This is often the time in pregnancy that you feel your best.  To take care of yourself and your unborn baby, you will need to eat healthy meals, take medicines only if your doctor tells you to do so, and do activities that are safe for you and your baby.  Call your doctor if you get sick or if you notice anything unusual about your pregnancy. Also, call your doctor if you need help with the right food to eat, or if you want to know what activities are safe for you. This information is not intended to replace advice given to you by your health care provider. Make sure you discuss any questions you have with your health care provider. Document Released: 01/05/2010 Document Revised: 02/02/2019 Document Reviewed: 11/16/2016 Elsevier Patient Education  2020 [employer].  Places to have your son circumcised:                                                                      [facility]     [phone]   [phone] while you are in hospital         [facility]              [phone]   $269 by 4 wks                      [facility]                     [phone]   $269 by 7 days [facility]                    [phone]   $269 by 4 wks [facility]             [phone]   $  225  by 2 wks    These prices sometimes change but are roughly what you can expect to pay. Please call and confirm pricing.   Circumcision is considered an elective/non-medically necessary procedure. There are many reasons parents decide to have their sons circumsized. During the first year of life circumcised males have a reduced risk of urinary tract infections but after this year the rates between circumcised males and uncircumcised males are the same.  It is safe to have your son circumcised outside of the hospital and the places above perform them regularly.   Deciding about Circumcision in Baby Boys  (Up-to-date The Basics)  What is circumcision?   Circumcision is a surgery that removes the skin that covers the tip of the penis, called the "foreskin" Circumcision is usually done when a boy is between 6 and 50 days old. In the [place], circumcision is common. In some other countries, fewer boys are circumcised. Circumcision is a common tradition in some religions.  Should I have my baby boy circumcised?   There is no easy answer. Circumcision has some benefits. But it also has risks. After talking with your doctor, you will have to decide for yourself what is right for your family.  What are the benefits of circumcision?   Circumcised boys seem to have slightly lower rates of: ?Urinary tract infections ?Swelling of the opening at the tip of the penis Circumcised men seem to have slightly lower rates of: ?Urinary tract infections ?Swelling of the opening at the tip of the penis ?Penis cancer ?HIV and other infections that you catch during sex ?Cervical cancer in the women they have sex with Even so, in the [place], the risks of these problems are small - even in boys and men who have not been circumcised. Plus, boys and men who are not circumcised can reduce these extra risks by: ?Cleaning their penis well ?Using condoms during sex  What are the risks of circumcision?   Risks include: ?Bleeding or infection from the surgery ?Damage to or amputation of the penis ?A chance that the doctor will cut off too much or not enough of the foreskin ?A chance that sex won't feel as good later in life Only about 1 out of every 200 circumcisions leads to problems. There is also a chance that your health insurance won't pay for circumcision.  How is circumcision done in baby boys?  First, the baby gets medicine for pain relief. This might be a cream on the skin or a shot into the base of the penis. Next, the doctor cleans the baby's penis well. Then he or she uses special tools to cut off the foreskin. Finally, the doctor wraps a bandage (called gauze) around the baby's penis. If you have your baby circumcised, his doctor or nurse will give you instructions on how to care for him after the surgery. It is important that you follow those instructions carefully.

## 2019-08-31 NOTE — Progress Notes (Signed)
Pt is here for ROB. [redacted]w[redacted]d.

## 2019-08-31 NOTE — Progress Notes (Signed)
   PRENATAL VISIT NOTE  Subjective:  Andrea Jenkins is a 23 y.o. G2P1001 at [redacted]w[redacted]d being seen today for ongoing prenatal care.  She is currently monitored for the following issues for this high-risk pregnancy and has LGSIL on Pap smear of cervix; Blindness of right eye; Supervision of other normal pregnancy, antepartum; Short interval between pregnancies affecting pregnancy, antepartum; and Marginal insertion of umbilical cord affecting management of mother on their problem list.  Patient reports backache.  Contractions: Not present. Vag. Bleeding: None.  Movement: Present. Denies leaking of fluid.   The following portions of the patient's history were reviewed and updated as appropriate: allergies, current medications, past family history, past medical history, past social history, past surgical history and problem list.   Objective:   Vitals:   08/31/19 1059  BP: 105/67  Pulse: 91  Weight: 182 lb (82.6 kg)    Fetal Status: Fetal Heart Rate (bpm): 150 Fundal Height: 25 cm Movement: Present     General:  Alert, oriented and cooperative. Patient is in no acute distress.  Skin: Skin is warm and dry. No rash noted.   Cardiovascular: Normal heart rate noted  Respiratory: Normal respiratory effort, no problems with respiration noted  Abdomen: Soft, gravid, appropriate for gestational age.  Pain/Pressure: Absent     Pelvic: Cervical exam deferred        Extremities: Normal range of motion.  Edema: None  Mental Status: Normal mood and affect. Normal behavior. Normal judgment and thought content.   Assessment and Plan:  Pregnancy: G2P1001 at [redacted]w[redacted]d 1. Supervision of other normal pregnancy, antepartum - Doing well, had a fall on Monday on her bottom, no bleeding, contraction noted - Discussed Alpha Thalassemia carrier, declines partner testing  - Discussed need for 2 hr GTT at next visit   2. Short interval between pregnancies affecting pregnancy, antepartum  3. Marginal insertion of  umbilical cord affecting management of mother - Has growth [place] scheduled   Preterm labor symptoms and general obstetric precautions including but not limited to vaginal bleeding, contractions, leaking of fluid and fetal movement were reviewed in detail with the patient. Please refer to After Visit Summary for other counseling recommendations.   Return in about 4 weeks (around 09/28/2019) for LOB, In-Person, 28 week labs (fasting).  Future Appointments  Date Time Provider [place]  09/12/2019  9:45 AM [place] 2 WH-MFCUS MFC-US  09/19/2019  9:15 AM [facility] None  09/19/2019 10:15 AM Lavonia Drafts, MD [facility] None    Kerry Hough, PA-C

## 2019-09-06 DIAGNOSIS — N898 Other specified noninflammatory disorders of vagina: Secondary | ICD-10-CM

## 2019-09-07 MED ORDER — TERCONAZOLE 0.8 % VA CREA: 1.0000 | TOPICAL_CREAM | Freq: Every day | VAGINAL | 0 refills | Status: DC

## 2019-09-10 ENCOUNTER — Ambulatory Visit (HOSPITAL_COMMUNITY)
Admission: RE | Admit: 2019-09-10 | Discharge: 2019-09-10 | Disposition: A | Discharge status: Home / Self Care | Payer: Medicaid Other | Source: Ambulatory Visit | Attending: Obstetrics and Gynecology | Admitting: Obstetrics and Gynecology

## 2019-09-10 ENCOUNTER — Other Ambulatory Visit: Payer: Self-pay

## 2019-09-10 DIAGNOSIS — O43199 Other malformation of placenta, unspecified trimester: Secondary | ICD-10-CM | POA: Diagnosis not present

## 2019-09-10 DIAGNOSIS — Z362 Encounter for other antenatal screening follow-up: Secondary | ICD-10-CM

## 2019-09-10 DIAGNOSIS — Z3A26 26 weeks gestation of pregnancy: Secondary | ICD-10-CM | POA: Diagnosis not present

## 2019-09-10 DIAGNOSIS — O359XX0 Maternal care for (suspected) fetal abnormality and damage, unspecified, not applicable or unspecified: Secondary | ICD-10-CM | POA: Diagnosis not present

## 2019-09-10 DIAGNOSIS — O43192 Other malformation of placenta, second trimester: Secondary | ICD-10-CM

## 2019-09-11 ENCOUNTER — Other Ambulatory Visit (HOSPITAL_COMMUNITY): Payer: Self-pay | Admitting: *Deleted

## 2019-09-11 DIAGNOSIS — O43193 Other malformation of placenta, third trimester: Secondary | ICD-10-CM

## 2019-09-12 ENCOUNTER — Ambulatory Visit (HOSPITAL_COMMUNITY): Payer: Medicaid Other

## 2019-09-19 ENCOUNTER — Other Ambulatory Visit: Payer: Medicaid Other

## 2019-09-19 ENCOUNTER — Encounter: Payer: Medicaid Other | Admitting: Obstetrics & Gynecology

## 2019-09-24 ENCOUNTER — Other Ambulatory Visit (HOSPITAL_COMMUNITY)
Admission: RE | Admit: 2019-09-24 | Discharge: 2019-09-24 | Disposition: A | Discharge status: Home / Self Care | Payer: Medicaid Other | Source: Ambulatory Visit | Attending: Obstetrics and Gynecology | Admitting: Obstetrics and Gynecology

## 2019-09-24 ENCOUNTER — Other Ambulatory Visit: Payer: Self-pay

## 2019-09-24 ENCOUNTER — Encounter: Payer: Self-pay | Admitting: Obstetrics and Gynecology

## 2019-09-24 ENCOUNTER — Ambulatory Visit (INDEPENDENT_AMBULATORY_CARE_PROVIDER_SITE_OTHER): Payer: Medicaid Other | Admitting: Obstetrics and Gynecology

## 2019-09-24 VITALS — BP 114/71 | HR 84 | Wt 188.3 lb

## 2019-09-24 DIAGNOSIS — O43199 Other malformation of placenta, unspecified trimester: Secondary | ICD-10-CM

## 2019-09-24 DIAGNOSIS — Z3480 Encounter for supervision of other normal pregnancy, unspecified trimester: Secondary | ICD-10-CM | POA: Diagnosis not present

## 2019-09-24 DIAGNOSIS — O43193 Other malformation of placenta, third trimester: Secondary | ICD-10-CM

## 2019-09-24 DIAGNOSIS — N898 Other specified noninflammatory disorders of vagina: Secondary | ICD-10-CM | POA: Diagnosis not present

## 2019-09-24 DIAGNOSIS — Z3A28 28 weeks gestation of pregnancy: Secondary | ICD-10-CM

## 2019-09-24 DIAGNOSIS — O283 Abnormal ultrasonic finding on antenatal screening of mother: Secondary | ICD-10-CM

## 2019-09-24 MED ORDER — ACCU-CHEK GUIDE VI STRP: ORAL_STRIP | 6 refills | Status: DC

## 2019-09-24 MED ORDER — ACCU-CHEK GUIDE W/DEVICE KIT: 1.0000 | PACK | Freq: Once | 0 refills | Status: AC

## 2019-09-24 MED ORDER — ACCU-CHEK FASTCLIX LANCETS MISC: 1.0000 | Freq: Four times a day (QID) | 6 refills | Status: DC

## 2019-09-24 NOTE — Progress Notes (Signed)
   PRENATAL VISIT NOTE  Subjective:  Andrea Jenkins is a 23 y.o. G2P1001 at [redacted]w[redacted]d being seen today for ongoing prenatal care.  She is currently monitored for the following issues for this low-risk pregnancy and has LGSIL on Pap smear of cervix; Blindness of right eye; Supervision of other normal pregnancy, antepartum; Short interval between pregnancies affecting pregnancy, antepartum; Marginal insertion of umbilical cord affecting management of mother; and Fetal echogenic intracardiac focus on prenatal ultrasound on their problem list.  Patient reports vaginal discharge.  Contractions: Not present. Vag. Bleeding: None.  Movement: Present. Denies leaking of fluid.   The following portions of the patient's history were reviewed and updated as appropriate: allergies, current medications, past family history, past medical history, past social history, past surgical history and problem list.   Objective:   Vitals:   09/24/19 1615  BP: 114/71  Pulse: 84  Weight: 188 lb 4.8 oz (85.4 kg)    Fetal Status: Fetal Heart Rate (bpm): 150   Movement: Present     General:  Alert, oriented and cooperative. Patient is in no acute distress.  Skin: Skin is warm and dry. No rash noted.   Cardiovascular: Normal heart rate noted  Respiratory: Normal respiratory effort, no problems with respiration noted  Abdomen: Soft, gravid, appropriate for gestational age.  Pain/Pressure: Present     Pelvic: Cervical exam deferred        Extremities: Normal range of motion.  Edema: None  Mental Status: Normal mood and affect. Normal behavior. Normal judgment and thought content.   Assessment and Plan:  Pregnancy: G2P1001 at [redacted]w[redacted]d  1. Supervision of other normal pregnancy, antepartum Patient cannot come for 2 hr GTT due to work situation, will get A1C and have her check CBGs for next month - CBC -HIV -RPR  2. Marginal insertion of umbilical cord affecting management of mother Has f/u US 10/15/19  3. Fetal  echogenic intracardiac focus on prenatal ultrasound  4. Vaginal discharge Swab today  Preterm labor symptoms and general obstetric precautions including but not limited to vaginal bleeding, contractions, leaking of fluid and fetal movement were reviewed in detail with the patient. Please refer to After Visit Summary for other counseling recommendations.   Return in about 2 weeks (around 10/08/2019) for virtual, high OB.  Future Appointments  Date Time Provider [place]  10/09/2019  1:15 PM Emily Filbert, MD [facility] None  10/15/2019  3:45 PM [place] 2 [facility]    Sloan Leiter, MD

## 2019-09-24 NOTE — Patient Instructions (Addendum)
Check your blood glucose every day, four times a day. The first check should be upon waking up, before you eat or drink anything. Take your blood glucose 2 hours after every meal. Bring your log to your next appointment.

## 2019-09-24 NOTE — Progress Notes (Signed)
Patient reports fetal movement with occasional pain. Pt reports still having white discharge after competing terazol.

## 2019-09-25 LAB — CBC
Hematocrit: 29.7 % — ABNORMAL LOW (ref 34.0–46.6)
Hemoglobin: 9.9 g/dL — ABNORMAL LOW (ref 11.1–15.9)
MCH: 28 pg (ref 26.6–33.0)
MCHC: 33.3 g/dL (ref 31.5–35.7)
MCV: 84 fL (ref 79–97)
Platelets: 161 10*3/uL (ref 150–450)
RBC: 3.54 x10E6/uL — ABNORMAL LOW (ref 3.77–5.28)
RDW: 13.9 % (ref 11.7–15.4)
WBC: 6.2 10*3/uL (ref 3.4–10.8)

## 2019-09-25 LAB — CERVICOVAGINAL ANCILLARY ONLY
Bacterial Vaginitis (gardnerella): NEGATIVE
Candida Glabrata: NEGATIVE
Candida Vaginitis: NEGATIVE
Chlamydia: NEGATIVE
Comment: NEGATIVE
Comment: NEGATIVE
Comment: NEGATIVE
Comment: NEGATIVE
Comment: NEGATIVE
Comment: NORMAL
Neisseria Gonorrhea: NEGATIVE
Trichomonas: NEGATIVE

## 2019-09-25 LAB — HEMOGLOBIN A1C
Est. average glucose Bld gHb Est-mCnc: 94 mg/dL
Hgb A1c MFr Bld: 4.9 % (ref 4.8–5.6)

## 2019-09-25 LAB — HIV ANTIBODY (ROUTINE TESTING W REFLEX): HIV Screen 4th Generation wRfx: NONREACTIVE

## 2019-09-25 LAB — RPR: RPR Ser Ql: NONREACTIVE

## 2019-09-26 ENCOUNTER — Other Ambulatory Visit: Payer: Self-pay

## 2019-09-26 ENCOUNTER — Encounter: Payer: Self-pay | Admitting: Obstetrics

## 2019-09-26 ENCOUNTER — Other Ambulatory Visit: Payer: Medicaid Other

## 2019-09-26 DIAGNOSIS — Z3480 Encounter for supervision of other normal pregnancy, unspecified trimester: Secondary | ICD-10-CM

## 2019-09-27 LAB — GLUCOSE TOLERANCE, 2 HOURS W/ 1HR
Glucose, 1 hour: 106 mg/dL (ref 65–179)
Glucose, 2 hour: 85 mg/dL (ref 65–152)
Glucose, Fasting: 62 mg/dL — ABNORMAL LOW (ref 65–91)

## 2019-09-27 LAB — CBC
Hematocrit: 32.8 % — ABNORMAL LOW (ref 34.0–46.6)
Hemoglobin: 10.9 g/dL — ABNORMAL LOW (ref 11.1–15.9)
MCH: 28.2 pg (ref 26.6–33.0)
MCHC: 33.2 g/dL (ref 31.5–35.7)
MCV: 85 fL (ref 79–97)
Platelets: 154 10*3/uL (ref 150–450)
RBC: 3.87 x10E6/uL (ref 3.77–5.28)
RDW: 14 % (ref 11.7–15.4)
WBC: 5 10*3/uL (ref 3.4–10.8)

## 2019-09-27 LAB — RPR: RPR Ser Ql: NONREACTIVE

## 2019-09-27 LAB — HIV ANTIBODY (ROUTINE TESTING W REFLEX): HIV Screen 4th Generation wRfx: NONREACTIVE

## 2019-09-28 ENCOUNTER — Other Ambulatory Visit: Payer: Self-pay

## 2019-09-28 MED ORDER — FLUCONAZOLE 150 MG PO TABS: 150.0000 mg | ORAL_TABLET | Freq: Once | ORAL | 0 refills | Status: AC

## 2019-10-09 ENCOUNTER — Telehealth (INDEPENDENT_AMBULATORY_CARE_PROVIDER_SITE_OTHER): Payer: Medicaid Other | Admitting: Obstetrics & Gynecology

## 2019-10-09 ENCOUNTER — Other Ambulatory Visit: Payer: Self-pay

## 2019-10-09 VITALS — BP 111/67

## 2019-10-09 DIAGNOSIS — O09899 Supervision of other high risk pregnancies, unspecified trimester: Secondary | ICD-10-CM

## 2019-10-09 DIAGNOSIS — F439 Reaction to severe stress, unspecified: Secondary | ICD-10-CM

## 2019-10-09 DIAGNOSIS — O0990 Supervision of high risk pregnancy, unspecified, unspecified trimester: Secondary | ICD-10-CM

## 2019-10-09 DIAGNOSIS — O09893 Supervision of other high risk pregnancies, third trimester: Secondary | ICD-10-CM

## 2019-10-09 DIAGNOSIS — O0993 Supervision of high risk pregnancy, unspecified, third trimester: Secondary | ICD-10-CM

## 2019-10-09 DIAGNOSIS — Z3A31 31 weeks gestation of pregnancy: Secondary | ICD-10-CM

## 2019-10-09 DIAGNOSIS — O43199 Other malformation of placenta, unspecified trimester: Secondary | ICD-10-CM

## 2019-10-09 DIAGNOSIS — O43193 Other malformation of placenta, third trimester: Secondary | ICD-10-CM

## 2019-10-09 NOTE — Progress Notes (Signed)
Pt is on the phone preparing for virtual visit with provider. [redacted]w[redacted]d.

## 2019-10-09 NOTE — Progress Notes (Signed)
TELEHEALTH OBSTETRICS PRENATAL VIRTUAL VIDEO VISIT ENCOUNTER NOTE  Provider location: [facility] [place]   I connected with Andrea Jenkins on 10/09/19 at  1:15 PM EST by MyChart Video Encounter at home and verified that I am speaking with the correct person using two identifiers.   I discussed the limitations, risks, security and privacy concerns of performing an evaluation and management service virtually and the availability of in person appointments. I also discussed with the patient that there may be a patient responsible charge related to this service. The patient expressed understanding and agreed to proceed. Subjective:  Andrea Jenkins is a 23 y.o. G2P1001 at [redacted]w[redacted]d being seen today for ongoing prenatal care.  She is currently monitored for the following issues for this low-risk pregnancy and has LGSIL on Pap smear of cervix; Blindness of right eye; Supervision of other normal pregnancy, antepartum; Short interval between pregnancies affecting pregnancy, antepartum; Marginal insertion of umbilical cord affecting management of mother; and Fetal echogenic intracardiac focus on prenatal ultrasound on their problem list.  Patient reports no complaints. She is feeling lots of stress, wanting to discuss IOL. I explained that IOLs are not done unless there is a medical indication. She does agree to talk about her stress with Asher Muir of [facility]. She denies HI/SI. Contractions: Irritability. Vag. Bleeding: None.  Movement: Present. Denies any leaking of fluid.   The following portions of the patient's history were reviewed and updated as appropriate: allergies, current medications, past family history, past medical history, past social history, past surgical history and problem list.   Objective:   Vitals:   10/09/19 1341  BP: 111/67    Fetal Status:     Movement: Present     General:  Alert, oriented and cooperative. Patient is in no acute distress.    Respiratory: Normal respiratory effort, no problems with respiration noted  Mental Status: Normal mood and affect. Normal behavior. Normal judgment and thought content.  Rest of physical exam deferred due to type of encounter  Imaging: [place] MFM OB FOLLOW UP  Result Date: 09/10/2019 ----------------------------------------------------------------------  OBSTETRICS REPORT                       (Signed Final 09/10/2019 04:45 pm) ---------------------------------------------------------------------- Patient Info  ID #:       [id number]                          D.O.B.:  April 21, 1996 (23 yrs)  Name:       Andrea Jenkins [facility]               Visit Date: 09/10/2019 03:58 pm ---------------------------------------------------------------------- Performed By  Performed By:     [name]          Ref. Address:     Faculty                    RDMS  Attending:        Ma Rings MD         [facility]  Referred By:      Sharyon Cable CNM ---------------------------------------------------------------------- Orders   #  Description                          Code         Ordered By   1  [place] MFM OB FOLLOW UP                  16109.60     RAVI SHANKAR  ----------------------------------------------------------------------   #  Order #                    Accession #                 Episode #   1  [id number]                  [id number]                  [id number]  ---------------------------------------------------------------------- Indications   Marginal insertion of umbilical cord affecting O43.192   management of mother in second trimester   Genetic carrier (silent carrier for alpha      Z14.8   thalassemia)   Antenatal follow-up for nonvisualized fetal    Z36.2   anatomy   [redacted] weeks gestation of pregnancy                Z3A.26   Fetal abnormality - other known or             O35.9XX0   suspected (LVEIF and RVEIF)   ---------------------------------------------------------------------- Vital Signs                                                 Height:        5'3" ---------------------------------------------------------------------- Fetal Evaluation  Num Of Fetuses:         1  Fetal Heart Rate(bpm):  159  Cardiac Activity:       Observed  Presentation:           Cephalic  Placenta:               Anterior  P. Cord Insertion:      Marginal insertion  Amniotic Fluid  AFI FV:      Within normal limits ---------------------------------------------------------------------- Biometry  BPD:      70.4  mm     G. Age:  28w 2d         83  %    CI:        75.87   %    70 - 86                                                          FL/HC:      18.9   %    18.6 - 20.4  HC:      256.2  mm     G. Age:  27w 6d  56  %    HC/AC:      1.15        1.05 - 1.21  AC:      223.6  mm     G. Age:  26w 5d         39  %    FL/BPD:     68.6   %    71 - 87  FL:       48.3  mm     G. Age:  26w 1d         18  %    FL/AC:      21.6   %    20 - 24  Est. FW:     980  gm      2 lb 3 oz     34  % ---------------------------------------------------------------------- OB History  Gravidity:    2         Term:   1  Living:       1 ---------------------------------------------------------------------- Gestational Age  LMP:           26w 6d        Date:  03/06/19                 EDD:   12/11/19  U/S Today:     27w 2d                                        EDD:   12/08/19  Best:          26w 6d     Det. By:  LMP  (03/06/19)          EDD:   12/11/19 ---------------------------------------------------------------------- Anatomy  Cranium:               Appears normal         Aortic Arch:            Appears normal  Cavum:                 Appears normal         Ductal Arch:            Previously seen  Ventricles:            Appears normal         Diaphragm:              Appears normal  Choroid Plexus:        Appears normal         Stomach:                Appears  normal, left                                                                        sided  Cerebellum:            Previously seen        Abdomen:                Appears normal  Posterior Fossa:       Previously seen  Abdominal Wall:         Previously seen  Nuchal Fold:           Previously seen        Cord Vessels:           Previously seen  Face:                  Orbits and profile     Kidneys:                Appear normal                         previously seen  Lips:                  Previously seen        Bladder:                Appears normal  Thoracic:              Appears normal         Spine:                  Appears normal  Heart:                 Echogenic focus        Upper Extremities:      Previously seen                         in LV and RV  RVOT:                  Appears normal         Lower Extremities:      Previously seen  LVOT:                  Appears normal  Other:  Fetus appears to be a female.  Heels and 5th digit previously          visualized. Nasal bone previously visualized. Technically difficult due          to fetal position. ---------------------------------------------------------------------- Cervix Uterus Adnexa  Cervix  Not visualized (advanced GA >24wks) ---------------------------------------------------------------------- Comments  This patient was seen for a follow up growth scan due to a  fetus with a marginal placental cord insertion. She denies any  problems since her last exam.  She was informed that the fetal growth and amniotic fluid  level appears appropriate for her gestational age.  A follow up exam was scheduled in 5 weeks. ----------------------------------------------------------------------                   Johnell Comings, MD Electronically Signed Final Report   09/10/2019 04:45 pm ----------------------------------------------------------------------   Assessment and Plan:  Pregnancy: G2P1001 at [redacted]w[redacted]d 1. Marginal insertion of umbilical cord affecting  management of mother - serial u/s with MFM, next one is next Monday - she doesn't have a scale and will get weighed at her u/s visit  2. Short interval between pregnancies affecting pregnancy, antepartum   3. Supervision of high risk pregnancy, antepartum   Preterm labor symptoms and general obstetric precautions including but not limited to vaginal bleeding, contractions, leaking of fluid and fetal movement were reviewed in detail with the patient. I discussed the assessment and treatment plan with the patient. The patient was provided an opportunity to ask questions and all were answered. The patient  agreed with the plan and demonstrated an understanding of the instructions. The patient was advised to call back or seek an in-person office evaluation/[facility] for any urgent or concerning symptoms. Please refer to After Visit Summary for other counseling recommendations.   I provided 10 minutes of face-to-face time during this encounter.  No follow-ups on file.  Future Appointments  Date Time Provider Department Center  10/15/2019  3:45 PM [facility] US 2 [facility]US    Allie BossierMyra C Thunder [place], MD [facility], [facility]

## 2019-10-15 ENCOUNTER — Ambulatory Visit (HOSPITAL_COMMUNITY): Payer: Medicaid Other

## 2019-10-15 ENCOUNTER — Other Ambulatory Visit: Payer: Self-pay

## 2019-10-15 ENCOUNTER — Ambulatory Visit (HOSPITAL_COMMUNITY)
Admission: RE | Admit: 2019-10-15 | Discharge: 2019-10-15 | Disposition: A | Discharge status: Home / Self Care | Payer: Medicaid Other | Source: Ambulatory Visit | Attending: Obstetrics | Admitting: Obstetrics

## 2019-10-15 ENCOUNTER — Other Ambulatory Visit (HOSPITAL_COMMUNITY): Payer: Self-pay | Admitting: *Deleted

## 2019-10-15 DIAGNOSIS — O43193 Other malformation of placenta, third trimester: Secondary | ICD-10-CM | POA: Insufficient documentation

## 2019-10-15 DIAGNOSIS — O359XX0 Maternal care for (suspected) fetal abnormality and damage, unspecified, not applicable or unspecified: Secondary | ICD-10-CM

## 2019-10-15 DIAGNOSIS — Z362 Encounter for other antenatal screening follow-up: Secondary | ICD-10-CM

## 2019-10-15 DIAGNOSIS — Z3A31 31 weeks gestation of pregnancy: Secondary | ICD-10-CM

## 2019-10-15 NOTE — BH Specialist Note (Addendum)
Integrated Behavioral Health via Telemedicine Phone Visit  Patient and/or legal guardian verbally consented to [facility] services about presenting concerns and psychiatric consultation as appropriate.   10/29/2019 Elza Rafter [id number]  Number of Integrated Behavioral Health visits:  1 Session Start time: 9:45  Session End time: 10:18 Total time: 7  Referring Provider: Clovia Cuff, MD Type of Visit: Phone Patient/Family location: Home [facility] Provider location: [facility] All persons participating in visit: Patient Alleya Demeter and [place]   Confirmed patient's address: Yes  Confirmed patient's phone number: Yes  Any changes to demographics: No   Confirmed patient's insurance: Yes  Any changes to patient's insurance: No   Discussed confidentiality: Yes   I connected with Erminie L Firmin  by a video enabled telemedicine application and verified that I am speaking with the correct person using two identifiers.     I discussed the limitations of evaluation and management by telemedicine and the availability of in person appointments.  I discussed that the purpose of this visit is to provide behavioral health care while limiting exposure to the novel coronavirus.   Discussed there is a possibility of technology failure and discussed alternative modes of communication if that failure occurs.  I discussed that engaging in this video visit, they consent to the provision of behavioral healthcare and the services will be billed under their insurance.  Patient and/or legal guardian expressed understanding and consented to video visit: Yes   PRESENTING CONCERNS: Patient and/or family reports the following symptoms/concerns: Pt states her primary concern today is financial stress after being out of work since 01/05/19 due to covid, with food insecurity and difficulty paying utilities.Pt copes best by talking to her sister, and enjoying humorous moments  with daughter.  Pt has felt depressed and anxious since prior to first pregnancy; is open to starting medication.  Duration of problem: Ongoing with increase in pregnancy; Severity of problem: severe  STRENGTHS (Protective Factors/Coping Skills): Open to treatment  GOALS ADDRESSED: Patient will: 1.  Reduce symptoms of: anxiety, depression and stress  2.  Increase knowledge and/or ability of: healthy habits and stress reduction  3.  Demonstrate ability to: Increase healthy adjustment to current life circumstances  INTERVENTIONS: Interventions utilized:  Psychoeducation and/or Health Education and Link to [employer] Standardized Assessments completed: GAD-7 and PHQ 9  ASSESSMENT: Patient currently experiencing Adjustment disorder with mixed anxiety and depression and Psychosocial stress.   Patient may benefit from psychoeducation and brief therapeutic interventions regarding coping with symptoms of depression and anxiety, and life stress .  PLAN: 1. Follow up with behavioral health clinician on : One week 2. Behavioral recommendations:  -Continue talking to sister daily for at least 5 minutes -Consider writing down all the humorous things daughter does, as a reminder when feeling down -Sign [id number] consent, sent via text, to receive referrals for food and utilities 3. Referral(s): Integrated Orthoptist (In Clinic) and [employer] Resources:  Food and Utilities  I discussed the assessment and treatment plan with the patient and/or parent/guardian. They were provided an opportunity to ask questions and all were answered. They agreed with the plan and demonstrated an understanding of the instructions.   They were advised to call back or seek an in-person evaluation if the symptoms worsen or if the condition fails to improve as anticipated.  Caroleen Hamman Ramon Brant  Depression screen [facility] 2/9 10/29/2019  Decreased Interest 3  Down, Depressed, Hopeless 2  PHQ - 2 Score  5  Altered sleeping  3  Tired, decreased energy 3  Change in appetite 3  Feeling bad or failure about yourself  1  Trouble concentrating 1  Moving slowly or fidgety/restless 0  Suicidal thoughts 0  PHQ-9 Score 16   GAD 7 : Generalized Anxiety Score 10/29/2019  Nervous, Anxious, on Edge 3  Control/stop worrying 3  Worry too much - different things 3  Trouble relaxing 3  Restless 0  Easily annoyed or irritable 3  Afraid - awful might happen 0  Total GAD 7 Score 15

## 2019-10-17 ENCOUNTER — Ambulatory Visit (HOSPITAL_COMMUNITY)
Admission: EM | Admit: 2019-10-17 | Discharge: 2019-10-17 | Disposition: A | Discharge status: Home / Self Care | Payer: Medicaid Other | Attending: Emergency Medicine | Admitting: Emergency Medicine

## 2019-10-17 ENCOUNTER — Encounter (HOSPITAL_COMMUNITY): Payer: Self-pay | Admitting: Emergency Medicine

## 2019-10-17 ENCOUNTER — Other Ambulatory Visit: Payer: Self-pay

## 2019-10-17 DIAGNOSIS — L03012 Cellulitis of left finger: Secondary | ICD-10-CM | POA: Diagnosis not present

## 2019-10-17 MED ORDER — CEPHALEXIN 250 MG PO CAPS: 250.0000 mg | ORAL_CAPSULE | Freq: Four times a day (QID) | ORAL | 0 refills | Status: DC

## 2019-10-17 NOTE — Discharge Instructions (Addendum)
Advised patient to take medication as prescribed Advised patient to complete antibiotic course Advised to soak hand in warm water [place] resource was added To return for worsening symptoms.

## 2019-10-17 NOTE — ED Provider Notes (Signed)
Andrea Jenkins    CSN: [id number] Arrival date & time: 10/17/19  1446      History   Chief Complaint Chief Complaint  Patient presents with  . Hand Pain    HPI Andrea Jenkins is a 23 y.o. female.   Andrea Jenkins 16 y old female  presented to the urgent care with a complaint of left middle finger pain for the past 10 days.  It develop gradually after the patient ripped a pice of her nail .  She localizes the pain to the left middle finger.  She describes the pain as constant and achy and rated at 6 on a scale of 1-10. Pain is at 10 on a range of motion (ROM) and at night time. She has tried OTC neosporin and peroxide  without relief.  Her symptoms are made worse with movement . She denies similar symptoms in the past.    The history is provided by the patient. A language interpreter was used.    Past Medical History:  Diagnosis Date  . Asthma    pt states she doesn't use inhaler much (04/10/19)  . Gonorrhea   . Heart murmur    when younger  . Vaginal Pap smear, abnormal     Patient Active Problem List   Diagnosis Date Noted  . Fetal echogenic intracardiac focus on prenatal ultrasound 08/31/2019  . Marginal insertion of umbilical cord affecting management of mother 07/19/2019  . Short interval between pregnancies affecting pregnancy, antepartum 05/30/2019  . Supervision of other normal pregnancy, antepartum 05/15/2019  . 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 CATARACT EXTRACTION    . WISDOM TOOTH EXTRACTION    . WRIST SURGERY      OB History    Gravida  2   Para  1   Term  1   Preterm  0   AB  0   Living  1     SAB  0   TAB  0   Ectopic  0   Multiple  0   Live Births  1            Home Medications    Prior to Admission medications   Medication Sig Start Date End Date Taking? Authorizing Provider  Accu-Chek FastClix Lancets MISC  1 each by Percutaneous route 4 (four) times daily. Patient not taking: Reported on 10/09/2019 09/24/19   Sloan Leiter, MD  Blood Pressure KIT Monitor BP readings at home regularly.  Dx O09.90   Large Cuff Patient not taking: Reported on 08/31/2019 07/12/19   Lajean Manes, CNM  cephALEXin (KEFLEX) 250 MG capsule Take 1 capsule (250 mg total) by mouth 4 (four) times daily. 10/17/19   Salih Williamson, Darrelyn Hillock, FNP  Elastic Bandages & Supports (COMFORT FIT MATERNITY SUPP LG) MISC 1 Units by Does not apply route daily. Patient not taking: Reported on 08/31/2019 05/30/19   Lajean Manes, CNM  Elastic Bandages & Supports (COMFORT FIT MATERNITY SUPP SM) MISC 1 Units by Does not apply route daily as needed. Patient not taking: Reported on 08/31/2019 05/25/19   Jorje Guild, NP  glucose blood (ACCU-CHEK GUIDE) test strip Use 1 tet strip to check blood glucose 4 times daily Patient not taking: Reported on 10/09/2019 09/24/19   Sloan Leiter, MD  omeprazole (PRILOSEC) 20 MG capsule Take 1 capsule (20 mg total) by mouth  2 (two) times daily before a meal. Patient not taking: Reported on 08/31/2019 08/23/19   Shelly Bombard, MD  Prenat w/o A-FeCbn-Meth-FA-DHA (PRENATE MINI) 29-0.6-0.4-350 MG CAPS Take 1 capsule by mouth daily before breakfast. Patient not taking: Reported on 08/31/2019 02/01/19   Shelly Bombard, MD  Prenat-Fe Poly-Methfol-FA-DHA (VITAFOL ULTRA) 29-0.6-0.4-200 MG CAPS Take 1 tablet by mouth daily. 07/06/19   Shelly Bombard, MD  terconazole (TERAZOL 3) 0.8 % vaginal cream Place 1 applicator vaginally at bedtime. Patient not taking: Reported on 07/12/2019 06/30/19   Jorje Guild, NP  terconazole (TERAZOL 3) 0.8 % vaginal cream Place 1 applicator vaginally at bedtime. Apply nightly for three nights. Patient not taking: Reported on 09/24/2019 09/07/19   Shelly Bombard, MD    Family History Family History  Problem Relation Age of Onset  . Hypertension Mother   . Miscarriages /  [place] Mother   . Hypertension Maternal Grandmother   . Asthma Maternal Grandmother   . Diabetes Maternal Grandmother   . Vision loss Maternal Grandmother   . Mental illness Brother   . Cancer Maternal Aunt   . Heart disease Neg Hx     Social History Social History   Tobacco Use  . Smoking status: Former Smoker    Packs/day: 0.25    Types: Cigars    Quit date: 03/11/2017    Years since quitting: 2.6  . Smokeless tobacco: Never Used  Substance Use Topics  . Alcohol use: No    Alcohol/week: 0.0 standard drinks    Comment: mimosa on saturday. 04/06/19  . Drug use: No     Allergies   Peach [prunus persica]   Review of Systems Review of Systems  Constitutional: Negative.   HENT: Negative.   Respiratory: Negative.   Cardiovascular: Negative.   Musculoskeletal: Negative.      Physical Exam Triage Vital Signs ED Triage Vitals  Enc Vitals Group     BP      Pulse      Resp      Temp      Temp src      SpO2      Weight      Height      Head Circumference      Peak Flow      Pain Score      Pain Loc      Pain Edu?      Excl. in [place]?    No data found.  Updated Vital Signs BP 113/64 (BP Location: Right Arm)   Pulse 99   Temp 98.6 F (37 C) (Oral)   Resp 16   LMP 03/06/2019 (Exact Date)   SpO2 100%   Breastfeeding No   Visual Acuity Right Eye Distance:   Left Eye Distance:   Bilateral Distance:    Right Eye Near:   Left Eye Near:    Bilateral Near:     Physical Exam Constitutional:      Appearance: Normal appearance. She is normal weight.  Cardiovascular:     Rate and Rhythm: Normal rate and regular rhythm.     Pulses: Normal pulses.     Heart sounds: Normal heart sounds.  Pulmonary:     Effort: Pulmonary effort is normal.     Breath sounds: Normal breath sounds.  Musculoskeletal:     Right hand: Normal.     Left hand: Swelling and tenderness present.     Comments: Left middle finger tenderness and pain on ROM  Neurological:  Mental Status: She is alert.   10/17/19     UC Treatments / Results  Labs (all labs ordered are listed, but only abnormal results are displayed) Labs Reviewed - No data to display  EKG   Radiology No results found.  Procedures Procedures (including critical care time)  Medications Ordered in UC Medications - No data to display  Initial Impression / Assessment and Plan / UC Course  I have reviewed the triage vital signs and the nursing notes.  Pertinent labs & imaging results that were available during my care of the patient were reviewed by me and considered in my medical decision making (see chart for details).   Patient stable for discharge.  Benign physical exam.  Will treat patient for paronychia with doxycycline.  Advised patient to take antibiotic to completion.  To return for worsening of symptoms.  Patient verbalized understanding of the plan of care.  Final Clinical Impressions(s) / UC Diagnoses   Final diagnoses:  Paronychia of left middle finger     Discharge Instructions     Advised patient to take medication as prescribed Advised patient to complete antibiotic course Paronychia resource was added To return for worsening symptoms.    ED Prescriptions    Medication Sig Dispense Auth. Provider   cephALEXin (KEFLEX) 250 MG capsule Take 1 capsule (250 mg total) by mouth 4 (four) times daily. 28 capsule , Darrelyn Hillock, FNP     PDMP not reviewed this encounter.   Emerson Monte, [place] 10/17/19 1559

## 2019-10-17 NOTE — ED Triage Notes (Signed)
Pt c/o left middle finger pain onset 10 days ago... reports she ripped of a hang nail and since then, she's been having pain and swelling  Denies fevers  A&O x4... NAD.Marland Kitchen. ambulatory

## 2019-10-21 ENCOUNTER — Encounter (HOSPITAL_COMMUNITY): Payer: Self-pay

## 2019-10-21 ENCOUNTER — Other Ambulatory Visit: Payer: Self-pay

## 2019-10-21 ENCOUNTER — Ambulatory Visit (HOSPITAL_COMMUNITY)
Admission: EM | Admit: 2019-10-21 | Discharge: 2019-10-21 | Disposition: A | Discharge status: Home / Self Care | Payer: Medicaid Other | Attending: Urgent Care | Admitting: Urgent Care

## 2019-10-21 DIAGNOSIS — M79645 Pain in left finger(s): Secondary | ICD-10-CM

## 2019-10-21 DIAGNOSIS — Z3A33 33 weeks gestation of pregnancy: Secondary | ICD-10-CM

## 2019-10-21 DIAGNOSIS — L03012 Cellulitis of left finger: Secondary | ICD-10-CM

## 2019-10-21 MED ORDER — AMOXICILLIN-POT CLAVULANATE 875-125 MG PO TABS: 1.0000 | ORAL_TABLET | Freq: Two times a day (BID) | ORAL | 0 refills | Status: DC

## 2019-10-21 NOTE — ED Triage Notes (Signed)
Pt presents for follow Up for infection in left middle finger; pt still has significant swelling and pain in finger after being on antibiotics for a few days.

## 2019-10-21 NOTE — ED Provider Notes (Signed)
[place]   MRN: [id number] DOB: October 01, 1996  Subjective:   Andrea Jenkins is a 23 y.o. female presenting for recheck on persistent and worsening infection of left middle finger.  Patient was prescribed Keflex on 10/17/2019 for paronychia.  Since then, patient reports that she is worse in terms of her pain and swelling.  Pain radiates down to DIP joint and is felt throughout worst over the radial aspect and finger pad of left middle finger.  Patient is currently pregnant.  No current facility-administered medications for this encounter.  Current Outpatient Medications:  .  Accu-Chek FastClix Lancets MISC, 1 each by Percutaneous route 4 (four) times daily. (Patient not taking: Reported on 10/09/2019), Disp: 100 each, Rfl: 6 .  Blood Pressure KIT, Monitor BP readings at home regularly.  Dx O09.90   Large Cuff (Patient not taking: Reported on 08/31/2019), Disp: 1 kit, Rfl: 0 .  cephALEXin (KEFLEX) 250 MG capsule, Take 1 capsule (250 mg total) by mouth 4 (four) times daily., Disp: 28 capsule, Rfl: 0 .  Elastic Bandages & Supports (COMFORT FIT MATERNITY SUPP LG) MISC, 1 Units by Does not apply route daily. (Patient not taking: Reported on 08/31/2019), Disp: 1 each, Rfl: 0 .  Elastic Bandages & Supports (COMFORT FIT MATERNITY SUPP SM) MISC, 1 Units by Does not apply route daily as needed. (Patient not taking: Reported on 08/31/2019), Disp: 1 each, Rfl: 0 .  glucose blood (ACCU-CHEK GUIDE) test strip, Use 1 tet strip to check blood glucose 4 times daily (Patient not taking: Reported on 10/09/2019), Disp: 100 each, Rfl: 6 .  omeprazole (PRILOSEC) 20 MG capsule, Take 1 capsule (20 mg total) by mouth 2 (two) times daily before a meal. (Patient not taking: Reported on 08/31/2019), Disp: 60 capsule, Rfl: 5 .  Prenat w/o A-FeCbn-Meth-FA-DHA (PRENATE MINI) 29-0.6-0.4-350 MG CAPS, Take 1 capsule by mouth daily before breakfast. (Patient not taking: Reported on 08/31/2019), Disp: 30 capsule, Rfl: 11 .   Prenat-Fe Poly-Methfol-FA-DHA (VITAFOL ULTRA) 29-0.6-0.4-200 MG CAPS, Take 1 tablet by mouth daily., Disp: 30 capsule, Rfl: 12 .  terconazole (TERAZOL 3) 0.8 % vaginal cream, Place 1 applicator vaginally at bedtime. (Patient not taking: Reported on 07/12/2019), Disp: 20 g, Rfl: 0 .  terconazole (TERAZOL 3) 0.8 % vaginal cream, Place 1 applicator vaginally at bedtime. Apply nightly for three nights. (Patient not taking: Reported on 09/24/2019), Disp: 20 g, Rfl: 0   Allergies  Allergen Reactions  . Peach [Prunus Persica] Hives    Past Medical History:  Diagnosis Date  . Asthma    pt states she doesn't use inhaler much (04/10/19)  . Gonorrhea   . Heart murmur    when younger  . Vaginal Pap smear, abnormal      Past Surgical History:  Procedure Laterality Date  . CATARACT EXTRACTION    . WISDOM TOOTH EXTRACTION    . WRIST SURGERY      Family History  Problem Relation Age of Onset  . Hypertension Mother   . Miscarriages / [place] Mother   . Hypertension Maternal Grandmother   . Asthma Maternal Grandmother   . Diabetes Maternal Grandmother   . Vision loss Maternal Grandmother   . Mental illness Brother   . Cancer Maternal Aunt   . Heart disease Neg Hx     Social History   Tobacco Use  . Smoking status: Former Smoker    Packs/day: 0.25    Types: Cigars    Quit date: 03/11/2017    Years since  quitting: 2.6  . Smokeless tobacco: Never Used  Substance Use Topics  . Alcohol use: No    Alcohol/week: 0.0 standard drinks    Comment: mimosa on saturday. 04/06/19  . Drug use: No    ROS Denies fever, chest pain, nausea, vomiting, belly pain, red streaking along her hand or arm, pain of her hand.  Objective:   Vitals: BP (!) 111/59 (BP Location: Left Arm)   Pulse 91   Temp 98.5 F (36.9 C) (Oral)   Resp 18   LMP 03/06/2019 (Exact Date)   SpO2 100%   Physical Exam Constitutional:      General: She is not in acute distress.    Appearance: Normal appearance. She is  well-developed. She is not ill-appearing, toxic-appearing or diaphoretic.  HENT:     Head: Normocephalic and atraumatic.     Nose: Nose normal.     Mouth/Throat:     Mouth: Mucous membranes are moist.  Eyes:     Extraocular Movements: Extraocular movements intact.     Pupils: Pupils are equal, round, and reactive to light.  Cardiovascular:     Rate and Rhythm: Normal rate and regular rhythm.     Pulses: Normal pulses.     Heart sounds: Normal heart sounds. No murmur. No friction rub. No gallop.   Pulmonary:     Effort: Pulmonary effort is normal. No respiratory distress.     Breath sounds: Normal breath sounds. No stridor. No wheezing, rhonchi or rales.  Musculoskeletal:       Arms:  Skin:    General: Skin is warm and dry.     Findings: No rash.  Neurological:     Mental Status: She is alert and oriented to person, place, and time.  Psychiatric:        Mood and Affect: Mood normal.        Behavior: Behavior normal.        Thought Content: Thought content normal.     10/17/2019   10/21/2019       PROCEDURE NOTE: I&D of Abscess Verbal consent obtained. Local anesthesia for digital block with 3cc of 2% lidocaine without epi over palmar surface of left hand just inferior to proximal portion of left third finger. Site cleansed with Betadine. Incision of 1/2cm was made using a 11 blade and Adson forcep along border of nail on radial side, discharge of 1cc malodorous pus and serosanguinous fluid. Wound cavity was explored with curved hemostats and extends about 1cm toward finger pad. Cleansed and dressed.   Assessment and Plan :   1. Paronychia of left middle finger   2. Finger pain, left   3. [redacted] weeks gestation of pregnancy     Successful I&D of severe paronychia.  We will have her switch to Augmentin.  Wound care reviewed.  If patient has no improvement in 2 to 3 days, recommended she contact hand surgery for consult. Counseled patient on potential for adverse effects  with medications prescribed/recommended today, ER and return-to-clinic precautions discussed, patient verbalized understanding.    Jaynee Eagles, PA-C 10/21/19 1128

## 2019-10-21 NOTE — Discharge Instructions (Signed)
You may take 500mg Tylenol every 6 hours for pain and inflammation. ° °

## 2019-10-24 ENCOUNTER — Encounter: Payer: Self-pay | Admitting: Medical

## 2019-10-24 ENCOUNTER — Ambulatory Visit (INDEPENDENT_AMBULATORY_CARE_PROVIDER_SITE_OTHER): Payer: Medicaid Other | Admitting: Medical

## 2019-10-24 ENCOUNTER — Other Ambulatory Visit: Payer: Self-pay

## 2019-10-24 ENCOUNTER — Other Ambulatory Visit (HOSPITAL_COMMUNITY)
Admission: RE | Admit: 2019-10-24 | Discharge: 2019-10-24 | Disposition: A | Discharge status: Home / Self Care | Payer: Medicaid Other | Source: Ambulatory Visit | Attending: Medical | Admitting: Medical

## 2019-10-24 VITALS — BP 110/65 | HR 84 | Wt 190.0 lb

## 2019-10-24 DIAGNOSIS — N898 Other specified noninflammatory disorders of vagina: Secondary | ICD-10-CM | POA: Insufficient documentation

## 2019-10-24 DIAGNOSIS — O43193 Other malformation of placenta, third trimester: Secondary | ICD-10-CM

## 2019-10-24 DIAGNOSIS — O09899 Supervision of other high risk pregnancies, unspecified trimester: Secondary | ICD-10-CM

## 2019-10-24 DIAGNOSIS — R87612 Low grade squamous intraepithelial lesion on cytologic smear of cervix (LGSIL): Secondary | ICD-10-CM

## 2019-10-24 DIAGNOSIS — O26893 Other specified pregnancy related conditions, third trimester: Secondary | ICD-10-CM | POA: Diagnosis not present

## 2019-10-24 DIAGNOSIS — Z3A33 33 weeks gestation of pregnancy: Secondary | ICD-10-CM

## 2019-10-24 DIAGNOSIS — O09893 Supervision of other high risk pregnancies, third trimester: Secondary | ICD-10-CM

## 2019-10-24 DIAGNOSIS — D563 Thalassemia minor: Secondary | ICD-10-CM | POA: Insufficient documentation

## 2019-10-24 DIAGNOSIS — Z3480 Encounter for supervision of other normal pregnancy, unspecified trimester: Secondary | ICD-10-CM

## 2019-10-24 DIAGNOSIS — O283 Abnormal ultrasonic finding on antenatal screening of mother: Secondary | ICD-10-CM

## 2019-10-24 DIAGNOSIS — O43199 Other malformation of placenta, unspecified trimester: Secondary | ICD-10-CM

## 2019-10-24 NOTE — Progress Notes (Signed)
   PRENATAL VISIT NOTE  Subjective:  Andrea Jenkins is a 23 y.o. G2P1001 at [redacted]w[redacted]d being seen today for ongoing prenatal care.  She is currently monitored for the following issues for this high-risk pregnancy and has LGSIL on Pap smear of cervix; Blindness of right eye; Supervision of other normal pregnancy, antepartum; Short interval between pregnancies affecting pregnancy, antepartum; Marginal insertion of umbilical cord affecting management of mother; Fetal echogenic intracardiac focus on prenatal ultrasound; and Alpha thalassemia silent carrier on their problem list.  Patient reports occasional contractions.  Contractions: Irritability. Vag. Bleeding: None.  Movement: Present. Denies leaking of fluid.   The following portions of the patient's history were reviewed and updated as appropriate: allergies, current medications, past family history, past medical history, past social history, past surgical history and problem list.   Objective:   Vitals:   10/24/19 1017  BP: 110/65  Pulse: 84  Weight: 190 lb (86.2 kg)    Fetal Status: Fetal Heart Rate (bpm): 142 Fundal Height: 34 cm Movement: Present     General:  Alert, oriented and cooperative. Patient is in no acute distress.  Skin: Skin is warm and dry. No rash noted.   Cardiovascular: Normal heart rate noted  Respiratory: Normal respiratory effort, no problems with respiration noted  Abdomen: Soft, gravid, appropriate for gestational age.  Pain/Pressure: Absent     Pelvic: Cervical exam deferred        Extremities: Normal range of motion.  Edema: None  Mental Status: Normal mood and affect. Normal behavior. Normal judgment and thought content.   Assessment and Plan:  Pregnancy: G2P1001 at [redacted]w[redacted]d 1. Supervision of other normal pregnancy, antepartum - Changed plan for MOC from PP IUD to Patch - Planning outpatient circumcision at [facility]   2. Short interval between pregnancies affecting pregnancy, antepartum  3. Marginal  insertion of umbilical cord affecting management of mother - Last [place] 12/21 - EFW 22% - Follow-up scheduled 11/13/19  4. Fetal echogenic intracardiac focus on prenatal ultrasound  5. LGSIL on Pap smear of cervix - Normal repeat 2019  6. Alpha thalassemia silent carrier  Preterm labor symptoms and general obstetric precautions including but not limited to vaginal bleeding, contractions, leaking of fluid and fetal movement were reviewed in detail with the patient. Please refer to After Visit Summary for other counseling recommendations.   Return in about 2 weeks (around 11/07/2019) for LOB, Virtual.  Future Appointments  Date Time Provider [place]  10/29/2019  9:15 AM [place]  11/07/2019 11:15 AM Gavin Pound, CNM [facility] None  11/13/2019 10:30 AM [facility] NURSE [facility]  11/13/2019 10:30 AM [place] 1 [facility]    Kerry Hough, PA-C

## 2019-10-24 NOTE — Progress Notes (Addendum)
ROB.  C/o still having discharge with odor/yeast

## 2019-10-24 NOTE — Addendum Note (Signed)
Addended by: Luvenia Redden on: 10/24/2019 11:35 AM   Modules accepted: Orders

## 2019-10-24 NOTE — Patient Instructions (Signed)
Fetal Movement Counts Patient Name: ________________________________________________ Patient Due Date: ____________________ What is a fetal movement count?  A fetal movement count is the number of times that you feel your baby move during a certain amount of time. This may also be called a fetal kick count. A fetal movement count is recommended for every pregnant woman. You may be asked to start counting fetal movements as early as week 28 of your pregnancy. Pay attention to when your baby is most active. You may notice your baby's sleep and wake cycles. You may also notice things that make your baby move more. You should do a fetal movement count:  When your baby is normally most active.  At the same time each day. A good time to count movements is while you are resting, after having something to eat and drink. How do I count fetal movements? 1. Find a quiet, comfortable area. Sit, or lie down on your side. 2. Write down the date, the start time and stop time, and the number of movements that you felt between those two times. Take this information with you to your health care visits. 3. For 2 hours, count kicks, flutters, swishes, rolls, and jabs. You should feel at least 10 movements during 2 hours. 4. You may stop counting after you have felt 10 movements. 5. If you do not feel 10 movements in 2 hours, have something to eat and drink. Then, keep resting and counting for 1 hour. If you feel at least 4 movements during that hour, you may stop counting. Contact a health care provider if:  You feel fewer than 4 movements in 2 hours.  Your baby is not moving like he or she usually does. Date: ____________ Start time: ____________ Stop time: ____________ Movements: ____________ Date: ____________ Start time: ____________ Stop time: ____________ Movements: ____________ Date: ____________ Start time: ____________ Stop time: ____________ Movements: ____________ Date: ____________ Start time:  ____________ Stop time: ____________ Movements: ____________ Date: ____________ Start time: ____________ Stop time: ____________ Movements: ____________ Date: ____________ Start time: ____________ Stop time: ____________ Movements: ____________ Date: ____________ Start time: ____________ Stop time: ____________ Movements: ____________ Date: ____________ Start time: ____________ Stop time: ____________ Movements: ____________ Date: ____________ Start time: ____________ Stop time: ____________ Movements: ____________ This information is not intended to replace advice given to you by your health care provider. Make sure you discuss any questions you have with your health care provider. Document Released: 11/10/2006 Document Revised: 10/31/2018 Document Reviewed: 11/20/2015 Elsevier Patient Education  2020 [employer] Braxton Hicks Contractions Contractions of the uterus can occur throughout pregnancy, but they are not always a sign that you are in labor. You may have practice contractions called Braxton Hicks contractions. These false labor contractions are sometimes confused with true labor. What are Braxton Hicks contractions? Braxton Hicks contractions are tightening movements that occur in the muscles of the uterus before labor. Unlike true labor contractions, these contractions do not result in opening (dilation) and thinning of the cervix. Toward the end of pregnancy (32-34 weeks), Braxton Hicks contractions can happen more often and may become stronger. These contractions are sometimes difficult to tell apart from true labor because they can be very uncomfortable. You should not feel embarrassed if you go to the hospital with false labor. Sometimes, the only way to tell if you are in true labor is for your health care provider to look for changes in the cervix. The health care provider will do a physical exam and may monitor your contractions. If you   are not in true labor, the exam should show  that your cervix is not dilating and your water has not broken. If there are no other health problems associated with your pregnancy, it is completely safe for you to be sent home with false labor. You may continue to have Braxton Hicks contractions until you go into true labor. How to tell the difference between true labor and false labor True labor  Contractions last 30-70 seconds.  Contractions become very regular.  Discomfort is usually felt in the top of the uterus, and it spreads to the lower abdomen and low back.  Contractions do not go away with walking.  Contractions usually become more intense and increase in frequency.  The cervix dilates and gets thinner. False labor  Contractions are usually shorter and not as strong as true labor contractions.  Contractions are usually irregular.  Contractions are often felt in the front of the lower abdomen and in the groin.  Contractions may go away when you walk around or change positions while lying down.  Contractions get weaker and are shorter-lasting as time goes on.  The cervix usually does not dilate or become thin. Follow these instructions at home:   Take over-the-counter and prescription medicines only as told by your health care provider.  Keep up with your usual exercises and follow other instructions from your health care provider.  Eat and drink lightly if you think you are going into labor.  If Braxton Hicks contractions are making you uncomfortable: ? Change your position from lying down or resting to walking, or change from walking to resting. ? Sit and rest in a tub of warm water. ? Drink enough fluid to keep your urine pale yellow. Dehydration may cause these contractions. ? Do slow and deep breathing several times an hour.  Keep all follow-up prenatal visits as told by your health care provider. This is important. Contact a health care provider if:  You have a fever.  You have continuous pain in  your abdomen. Get help right away if:  Your contractions become stronger, more regular, and closer together.  You have fluid leaking or gushing from your vagina.  You pass blood-tinged mucus (bloody show).  You have bleeding from your vagina.  You have low back pain that you never had before.  You feel your baby's head pushing down and causing pelvic pressure.  Your baby is not moving inside you as much as it used to. Summary  Contractions that occur before labor are called Braxton Hicks contractions, false labor, or practice contractions.  Braxton Hicks contractions are usually shorter, weaker, farther apart, and less regular than true labor contractions. True labor contractions usually become progressively stronger and regular, and they become more frequent.  Manage discomfort from Braxton Hicks contractions by changing position, resting in a warm bath, drinking plenty of water, or practicing deep breathing. This information is not intended to replace advice given to you by your health care provider. Make sure you discuss any questions you have with your health care provider. Document Released: 02/24/2017 Document Revised: 09/23/2017 Document Reviewed: 02/24/2017 Elsevier Patient Education  2020 [employer]  

## 2019-10-25 LAB — CERVICOVAGINAL ANCILLARY ONLY
Bacterial Vaginitis (gardnerella): NEGATIVE
Candida Glabrata: NEGATIVE
Candida Vaginitis: NEGATIVE
Chlamydia: NEGATIVE
Comment: NEGATIVE
Comment: NEGATIVE
Comment: NEGATIVE
Comment: NEGATIVE
Comment: NEGATIVE
Comment: NORMAL
Neisseria Gonorrhea: NEGATIVE
Trichomonas: NEGATIVE

## 2019-10-26 NOTE — L&D Delivery Note (Addendum)
OB/GYN Faculty Practice Delivery Note  Andrea Jenkins is a 24 y.o. G2P1001 at [redacted]w[redacted]d presented for IOL d/t marginal cord insertion.  ROM: AROM at 1705 12/04/2019 with clear white/pink fluid GBS Status:  --[name] (01/20 4008)  Labor Progress: . Patient presented to L&D for induction of labor secondary to marginal cord insertion. Initial SVE: 4/80/-2. IOL uncomplicated, had foley bulb, misoprostol x1, AROM and pitocin. She then progressed to complete.   Delivery Date/Time: 12/04/2019 at 2105 Delivery: Called to room and patient was complete and pushing. Head position was OA and delivered with ease over the perineum. Nuchal cord present with one loop, infant delivered through. Shoulder and body delivered in usual fashion. Infant with spontaneous cry, placed on mother's abdomen, dried and stimulated. Cord clamped x 2 after 1-minute delay, and cut by FOB. Cord blood drawn. Placenta delivered spontaneously with gentle cord traction. Fundus firm with massage and pitocin started. Labia, perineum, vagina, and cervix inspected and significant for bilateral periurethral tears.  Right lacerations hemostatic and did not require repair.  Left laceration repaired with 4-0 Monocryl suture.  Baby Weight: 3201 grams  Cord: Marginal insertion, 3 vessel Placenta: Sent to L&D Complications: None Lacerations: Bilateral periurethral lacerations, left periurethral laceration was repaired in the standard fashion EBL: 325cc Analgesia: Epidural  Infant: APGAR (1 MIN):  9 APGAR (5 MINS): 9   Leonides Cave, DO, PGY-1 OBGYN Faculty Teaching Service  12/04/2019, 9:33 PM   OB FELLOW ATTESTATION  I was present, gloved, and supervising throughout delivery and have edited the above note to reflect any changes or updates.  Zack Seal, MD/MPH OB Fellow  12/04/2019, 10:45 PM

## 2019-10-29 ENCOUNTER — Ambulatory Visit (INDEPENDENT_AMBULATORY_CARE_PROVIDER_SITE_OTHER): Payer: Medicaid Other | Admitting: Clinical

## 2019-10-29 ENCOUNTER — Other Ambulatory Visit: Payer: Self-pay

## 2019-10-29 DIAGNOSIS — Z658 Other specified problems related to psychosocial circumstances: Secondary | ICD-10-CM

## 2019-10-29 DIAGNOSIS — F4323 Adjustment disorder with mixed anxiety and depressed mood: Secondary | ICD-10-CM

## 2019-10-29 NOTE — Addendum Note (Signed)
Addended by: Hulda Marin C on: 10/29/2019 12:47 PM   Modules accepted: Level of Service

## 2019-10-29 NOTE — Patient Instructions (Signed)
[facility] [address] [place], [place] [address] [phone]   or  www.http://james-[email or web address] **SNAP/EBT/ Other nutritional benefits  [facility] [address], [place], [place] [address] [phone]  or  https://palmer-smith.com/ **WIC for  women who are pregnant and postpartum, infants and children up to 23 years old  [place] [facility] [address], [place], [place] [address] [phone]   or   www.theblessedtable.org  **Food pantry  [facility] [place], [place], [place] [address] [phone]   or   https://brotherkolbes.godaddysites.com  **Emergency food and prepared meals  [facility] [address] [place], [place] [address] [phone]   or   www.cedargrovetop.us **Food pantry  [facility] [address] [place], [place] [address] [phone]   or   www.https://hartman-jones.net/ **Food pantry  [employer] [facility] [address], [place], [place] [address] [phone] **Food pantry  [facility] [address] [place], [place] [address] [phone]   or   www.greensborourbanministry.org  Insurance underwriter and prepared meals  [facility] [address] [place], [place], [place], [place] [address] DomainerFinder.be  **Food pantry  [employer] [facility] [address], [place], [place] [address] [phone]   or   www.lbcnow.org  **Food pantry  [facility] [address] [place], [place] [address] [phone]   or   http://patterson-parker.net/ **Food pantry, nutrition education, gardening activities  [place] [address], [place], [place] [address] [phone] **Food pantry  [facility] [place] [address], [place], [place] [address] [phone]   or   www.salvationarmyofgreensboro.Lovette Cliche of [place], [place], [place] [address] [phone]   or   http://senior-resources-guilford.org [facility] [place] [address], [place], [place] [address] [phone]   or   www.stmattchurch.com  **Food pantry  [place] [address] [place], [place] [address] [phone]   or   vandaliapresbyterianchurch.org Film/video editor Resources   [facility] [address], [place], [place] [address] [phone]   or   www.[email or web address].[email or web address]/ph/  [facility]) [address], [place], [place] [address] [phone]   or   MedicationWebsites.com.au **WIC for pregnant and postpartum women, infants and children up to 19 years old  [facility] [address] [place], [place] [address] [phone] **Food pantry  [place] [address] [place], [place] [address] [phone]   or   emerywoodbaptistchurch.com *Food pantry  [employer] [facility] [address] [place], [place] [address] [phone]   or   www.fcchighpoint.Radonna RickerFood pantry  [facility] [address], [place], [place] [address] [phone]   or   http://www.green.com/ **Food pantry  [facility] [address] [place], [place] [address] [phone]   or   www.facebook.com/KBCI1 Social worker of [place] [address] [place], [place] [address] [phone]   or   www.abbottscreek.org [employer] of [place] [phone]   **Delivers meals  [facility] [address] [place],  [address] [phone]   or  nbfgm.sundaystreamwebsites.com  Tree surgeon of [employer] [address] [place], [place] [address] [phone]   or   www.odm-hp.org  **Food pantry  [address] [facility] [address] [place], [place] [address] [phone]   or   R2live.tv **Food pantry  [facility] [address], [place], [place] [address] [phone]   or   WrestlingMonthly.pl **Emergency food and pet food  [facility] [address] [place], [place] [address] [phone]   or   www.senioradults.org **Congregate and delivered meals to older adults  [place] [facility] [employer] [address] [place], [place] [address] [phone]   or   https://www.miller-montoya.com/ **Back Pack Program for elementary school students  [facility] [address] [place], [place] [address] [phone]   or   www.wardstreetcommunityresources.org **Food pantry  [facility] [address], [place], [place] [address] [phone]   or   ResumeQuery.com.ee **Emergency food, nutrition classes, food budgeting

## 2019-11-03 ENCOUNTER — Inpatient Hospital Stay (HOSPITAL_BASED_OUTPATIENT_CLINIC_OR_DEPARTMENT_OTHER): Payer: Medicaid Other

## 2019-11-03 ENCOUNTER — Inpatient Hospital Stay (HOSPITAL_COMMUNITY)
Admission: AD | Admit: 2019-11-03 | Discharge: 2019-11-03 | Disposition: A | Discharge status: Home / Self Care | Payer: Medicaid Other | Attending: Obstetrics and Gynecology | Admitting: Obstetrics and Gynecology

## 2019-11-03 ENCOUNTER — Other Ambulatory Visit: Payer: Self-pay

## 2019-11-03 ENCOUNTER — Encounter (HOSPITAL_COMMUNITY): Payer: Self-pay | Admitting: Obstetrics and Gynecology

## 2019-11-03 DIAGNOSIS — O43123 Velamentous insertion of umbilical cord, third trimester: Secondary | ICD-10-CM | POA: Diagnosis not present

## 2019-11-03 DIAGNOSIS — O43199 Other malformation of placenta, unspecified trimester: Secondary | ICD-10-CM

## 2019-11-03 DIAGNOSIS — O43193 Other malformation of placenta, third trimester: Secondary | ICD-10-CM

## 2019-11-03 DIAGNOSIS — O368130 Decreased fetal movements, third trimester, not applicable or unspecified: Secondary | ICD-10-CM | POA: Insufficient documentation

## 2019-11-03 DIAGNOSIS — Z79899 Other long term (current) drug therapy: Secondary | ICD-10-CM | POA: Diagnosis not present

## 2019-11-03 DIAGNOSIS — O09893 Supervision of other high risk pregnancies, third trimester: Secondary | ICD-10-CM | POA: Diagnosis not present

## 2019-11-03 DIAGNOSIS — Z3A34 34 weeks gestation of pregnancy: Secondary | ICD-10-CM | POA: Diagnosis not present

## 2019-11-03 DIAGNOSIS — O09899 Supervision of other high risk pregnancies, unspecified trimester: Secondary | ICD-10-CM

## 2019-11-03 DIAGNOSIS — O26893 Other specified pregnancy related conditions, third trimester: Secondary | ICD-10-CM | POA: Diagnosis not present

## 2019-11-03 DIAGNOSIS — O359XX0 Maternal care for (suspected) fetal abnormality and damage, unspecified, not applicable or unspecified: Secondary | ICD-10-CM | POA: Diagnosis not present

## 2019-11-03 DIAGNOSIS — Z3689 Encounter for other specified antenatal screening: Secondary | ICD-10-CM

## 2019-11-03 DIAGNOSIS — O368190 Decreased fetal movements, unspecified trimester, not applicable or unspecified: Secondary | ICD-10-CM

## 2019-11-03 NOTE — Discharge Instructions (Signed)
Third Trimester of Pregnancy The third trimester is from week 28 through week 40 (months 7 through 9). The third trimester is a time when the unborn baby (fetus) is growing rapidly. At the end of the ninth month, the fetus is about 20 inches in length and weighs 6-10 pounds. Body changes during your third trimester Your body will continue to go through many changes during pregnancy. The changes vary from woman to woman. During the third trimester:  Your weight will continue to increase. You can expect to gain 25-35 pounds (11-16 kg) by the end of the pregnancy.  You may begin to get stretch marks on your hips, abdomen, and breasts.  You may urinate more often because the fetus is moving lower into your pelvis and pressing on your bladder.  You may develop or continue to have heartburn. This is caused by increased hormones that slow down muscles in the digestive tract.  You may develop or continue to have constipation because increased hormones slow digestion and cause the muscles that push waste through your intestines to relax.  You may develop hemorrhoids. These are swollen veins (varicose veins) in the rectum that can itch or be painful.  You may develop swollen, bulging veins (varicose veins) in your legs.  You may have increased body aches in the pelvis, back, or thighs. This is due to weight gain and increased hormones that are relaxing your joints.  You may have changes in your hair. These can include thickening of your hair, rapid growth, and changes in texture. Some women also have hair loss during or after pregnancy, or hair that feels dry or thin. Your hair will most likely return to normal after your baby is born.  Your breasts will continue to grow and they will continue to become tender. A yellow fluid (colostrum) may leak from your breasts. This is the first milk you are producing for your baby.  Your belly button may stick out.  You may notice more swelling in your hands,  face, or ankles.  You may have increased tingling or numbness in your hands, arms, and legs. The skin on your belly may also feel numb.  You may feel short of breath because of your expanding uterus.  You may have more problems sleeping. This can be caused by the size of your belly, increased need to urinate, and an increase in your body's metabolism.  You may notice the fetus "dropping," or moving lower in your abdomen (lightening).  You may have increased vaginal discharge.  You may notice your joints feel loose and you may have pain around your pelvic bone. What to expect at prenatal visits You will have prenatal exams every 2 weeks until week 36. Then you will have weekly prenatal exams. During a routine prenatal visit:  You will be weighed to make sure you and the baby are growing normally.  Your blood pressure will be taken.  Your abdomen will be measured to track your baby's growth.  The fetal heartbeat will be listened to.  Any test results from the previous visit will be discussed.  You may have a cervical check near your due date to see if your cervix has softened or thinned (effaced).  You will be tested for Group B streptococcus. This happens between 35 and 37 weeks. Your health care provider may ask you:  What your birth plan is.  How you are feeling.  If you are feeling the baby move.  If you have had any abnormal  symptoms, such as leaking fluid, bleeding, severe headaches, or abdominal cramping.  If you are using any tobacco products, including cigarettes, chewing tobacco, and electronic cigarettes.  If you have any questions. Other tests or screenings that may be performed during your third trimester include:  Blood tests that check for low iron levels (anemia).  Fetal testing to check the health, activity level, and growth of the fetus. Testing is done if you have certain medical conditions or if there are problems during the pregnancy.  Nonstress test  (NST). This test checks the health of your baby to make sure there are no signs of problems, such as the baby not getting enough oxygen. During this test, a belt is placed around your belly. The baby is made to move, and its heart rate is monitored during movement. What is false labor? False labor is a condition in which you feel small, irregular tightenings of the muscles in the womb (contractions) that usually go away with rest, changing position, or drinking water. These are called Braxton Hicks contractions. Contractions may last for hours, days, or even weeks before true labor sets in. If contractions come at regular intervals, become more frequent, increase in intensity, or become painful, you should see your health care provider. What are the signs of labor?  Abdominal cramps.  Regular contractions that start at 10 minutes apart and become stronger and more frequent with time.  Contractions that start on the top of the uterus and spread down to the lower abdomen and back.  Increased pelvic pressure and dull back pain.  A watery or bloody mucus discharge that comes from the vagina.  Leaking of amniotic fluid. This is also known as your "water breaking." It could be a slow trickle or a gush. Let your health care provider know if it has a color or strange odor. If you have any of these signs, call your health care provider right away, even if it is before your due date. Follow these instructions at home: Medicines  Follow your health care provider's instructions regarding medicine use. Specific medicines may be either safe or unsafe to take during pregnancy.  Take a prenatal vitamin that contains at least 600 micrograms (mcg) of folic acid.  If you develop constipation, try taking a stool softener if your health care provider approves. Eating and drinking   Eat a balanced diet that includes fresh fruits and vegetables, whole grains, good sources of protein such as meat, eggs, or tofu,  and low-fat dairy. Your health care provider will help you determine the amount of weight gain that is right for you.  Avoid raw meat and uncooked cheese. These carry germs that can cause birth defects in the baby.  If you have low calcium intake from food, talk to your health care provider about whether you should take a daily calcium supplement.  Eat four or five small meals rather than three large meals a day.  Limit foods that are high in fat and processed sugars, such as fried and sweet foods.  To prevent constipation: ? Drink enough fluid to keep your urine clear or pale yellow. ? Eat foods that are high in fiber, such as fresh fruits and vegetables, whole grains, and beans. Activity  Exercise only as directed by your health care provider. Most women can continue their usual exercise routine during pregnancy. Try to exercise for 30 minutes at least 5 days a week. Stop exercising if you experience uterine contractions.  Avoid heavy lifting.  Do  not exercise in extreme heat or humidity, or at high altitudes.  Wear low-heel, comfortable shoes.  Practice good posture.  You may continue to have sex unless your health care provider tells you otherwise. Relieving pain and discomfort  Take frequent breaks and rest with your legs elevated if you have leg cramps or low back pain.  Take warm sitz baths to soothe any pain or discomfort caused by hemorrhoids. Use hemorrhoid cream if your health care provider approves.  Wear a good support bra to prevent discomfort from breast tenderness.  If you develop varicose veins: ? Wear support pantyhose or compression stockings as told by your healthcare provider. ? Elevate your feet for 15 minutes, 3-4 times a day. Prenatal care  Write down your questions. Take them to your prenatal visits.  Keep all your prenatal visits as told by your health care provider. This is important. Safety  Wear your seat belt at all times when driving.  Make  a list of emergency phone numbers, including numbers for family, friends, the hospital, and police and fire departments. General instructions  Avoid cat litter boxes and soil used by cats. These carry germs that can cause birth defects in the baby. If you have a cat, ask someone to clean the litter box for you.  Do not travel far distances unless it is absolutely necessary and only with the approval of your health care provider.  Do not use hot tubs, steam rooms, or saunas.  Do not drink alcohol.  Do not use any products that contain nicotine or tobacco, such as cigarettes and e-cigarettes. If you need help quitting, ask your health care provider.  Do not use any medicinal herbs or unprescribed drugs. These chemicals affect the formation and growth of the baby.  Do not douche or use tampons or scented sanitary pads.  Do not cross your legs for long periods of time.  To prepare for the arrival of your baby: ? Take prenatal classes to understand, practice, and ask questions about labor and delivery. ? Make a trial run to the hospital. ? Visit the hospital and tour the maternity area. ? Arrange for maternity or paternity leave through employers. ? Arrange for family and friends to take care of pets while you are in the hospital. ? Purchase a rear-facing car seat and make sure you know how to install it in your car. ? Pack your hospital bag. ? Prepare the babys nursery. Make sure to remove all pillows and stuffed animals from the baby's crib to prevent suffocation.  Visit your dentist if you have not gone during your pregnancy. Use a soft toothbrush to brush your teeth and be gentle when you floss. Contact a health care provider if:  You are unsure if you are in labor or if your water has broken.  You become dizzy.  You have mild pelvic cramps, pelvic pressure, or nagging pain in your abdominal area.  You have lower back pain.  You have persistent nausea, vomiting, or  diarrhea.  You have an unusual or bad smelling vaginal discharge.  You have pain when you urinate. Get help right away if:  Your water breaks before 37 weeks.  You have regular contractions less than 5 minutes apart before 37 weeks.  You have a fever.  You are leaking fluid from your vagina.  You have spotting or bleeding from your vagina.  You have severe abdominal pain or cramping.  You have rapid weight loss or weight gain.  You have  shortness of breath with chest pain.  You notice sudden or extreme swelling of your face, hands, ankles, feet, or legs.  Your baby makes fewer than 10 movements in 2 hours.  You have severe headaches that do not go away when you take medicine.  You have vision changes. Summary  The third trimester is from week 28 through week 40, months 7 through 9. The third trimester is a time when the unborn baby (fetus) is growing rapidly.  During the third trimester, your discomfort may increase as you and your baby continue to gain weight. You may have abdominal, leg, and back pain, sleeping problems, and an increased need to urinate.  During the third trimester your breasts will keep growing and they will continue to become tender. A yellow fluid (colostrum) may leak from your breasts. This is the first milk you are producing for your baby.  False labor is a condition in which you feel small, irregular tightenings of the muscles in the womb (contractions) that eventually go away. These are called Braxton Hicks contractions. Contractions may last for hours, days, or even weeks before true labor sets in.  Signs of labor can include: abdominal cramps; regular contractions that start at 10 minutes apart and become stronger and more frequent with time; watery or bloody mucus discharge that comes from the vagina; increased pelvic pressure and dull back pain; and leaking of amniotic fluid. This information is not intended to replace advice given to you by your  health care provider. Make sure you discuss any questions you have with your health care provider. Document Revised: 02/01/2019 Document Reviewed: 11/16/2016 Elsevier Patient Education  2020 [employer]. Preterm Labor and Birth Information  The normal length of a pregnancy is 39-41 weeks. Preterm labor is when labor starts before 37 completed weeks of pregnancy. What are the risk factors for preterm labor? Preterm labor is more likely to occur in women who:  Have certain infections during pregnancy such as a bladder infection, sexually transmitted infection, or infection inside the uterus (chorioamnionitis).  Have a shorter-than-normal cervix.  Have gone into preterm labor before.  Have had surgery on their cervix.  Are younger than age 59 or older than age 66.  Are African American.  Are pregnant with twins or multiple babies (multiple gestation).  Take street drugs or smoke while pregnant.  Do not gain enough weight while pregnant.  Became pregnant shortly after having been pregnant. What are the symptoms of preterm labor? Symptoms of preterm labor include:  Cramps similar to those that can happen during a menstrual period. The cramps may happen with diarrhea.  Pain in the abdomen or lower back.  Regular uterine contractions that may feel like tightening of the abdomen.  A feeling of increased pressure in the pelvis.  Increased watery or bloody mucus discharge from the vagina.  Water breaking (ruptured amniotic sac). Why is it important to recognize signs of preterm labor? It is important to recognize signs of preterm labor because babies who are born prematurely may not be fully developed. This can put them at an increased risk for:  Long-term (chronic) heart and lung problems.  Difficulty immediately after birth with regulating body systems, including blood sugar, body temperature, heart rate, and breathing rate.  Bleeding in the brain.  Cerebral  palsy.  Learning difficulties.  Death. These risks are highest for babies who are born before 34 weeks of pregnancy. How is preterm labor treated? Treatment depends on the length of your pregnancy, your  condition, and the health of your baby. It may involve:  Having a stitch (suture) placed in your cervix to prevent your cervix from opening too early (cerclage).  Taking or being given medicines, such as: ? Hormone medicines. These may be given early in pregnancy to help support the pregnancy. ? Medicine to stop contractions. ? Medicines to help mature the babys lungs. These may be prescribed if the risk of delivery is high. ? Medicines to prevent your baby from developing cerebral palsy. If the labor happens before 34 weeks of pregnancy, you may need to stay in the hospital. What should I do if I think I am in preterm labor? If you think that you are going into preterm labor, call your health care provider right away. How can I prevent preterm labor in future pregnancies? To increase your chance of having a full-term pregnancy:  Do not use any tobacco products, such as cigarettes, chewing tobacco, and e-cigarettes. If you need help quitting, ask your health care provider.  Do not use street drugs or medicines that have not been prescribed to you during your pregnancy.  Talk with your health care provider before taking any herbal supplements, even if you have been taking them regularly.  Make sure you gain a healthy amount of weight during your pregnancy.  Watch for infection. If you think that you might have an infection, get it checked right away.  Make sure to tell your health care provider if you have gone into preterm labor before. This information is not intended to replace advice given to you by your health care provider. Make sure you discuss any questions you have with your health care provider. Document Revised: 02/02/2019 Document Reviewed: 03/03/2016 Elsevier Patient  Education  2020 [employer] Fetal Movement Counts Patient Name: ________________________________________________ Patient Due Date: ____________________ What is a fetal movement count?  A fetal movement count is the number of times that you feel your baby move during a certain amount of time. This may also be called a fetal kick count. A fetal movement count is recommended for every pregnant woman. You may be asked to start counting fetal movements as early as week 28 of your pregnancy. Pay attention to when your baby is most active. You may notice your baby's sleep and wake cycles. You may also notice things that make your baby move more. You should do a fetal movement count:  When your baby is normally most active.  At the same time each day. A good time to count movements is while you are resting, after having something to eat and drink. How do I count fetal movements? 1. Find a quiet, comfortable area. Sit, or lie down on your side. 2. Write down the date, the start time and stop time, and the number of movements that you felt between those two times. Take this information with you to your health care visits. 3. Write down your start time when you feel the first movement. 4. Count kicks, flutters, swishes, rolls, and jabs. You should feel at least 10 movements. 5. You may stop counting after you have felt 10 movements, or if you have been counting for 2 hours. Write down the stop time. 6. If you do not feel 10 movements in 2 hours, contact your health care provider for further instructions. Your health care provider may want to do additional tests to assess your baby's well-being. Contact a health care provider if:  You feel fewer than 10 movements in 2 hours.  Your baby is not moving like he or she usually does. Date: ____________ Start time: ____________ Stop time: ____________ Movements: ____________ Date: ____________ Start time: ____________ Stop time: ____________ Movements:  ____________ Date: ____________ Start time: ____________ Stop time: ____________ Movements: ____________ Date: ____________ Start time: ____________ Stop time: ____________ Movements: ____________ Date: ____________ Start time: ____________ Stop time: ____________ Movements: ____________ Date: ____________ Start time: ____________ Stop time: ____________ Movements: ____________ Date: ____________ Start time: ____________ Stop time: ____________ Movements: ____________ Date: ____________ Start time: ____________ Stop time: ____________ Movements: ____________ Date: ____________ Start time: ____________ Stop time: ____________ Movements: ____________ This information is not intended to replace advice given to you by your health care provider. Make sure you discuss any questions you have with your health care provider. Document Revised: 05/31/2019 Document Reviewed: 05/31/2019 Elsevier Patient Education  [place].

## 2019-11-03 NOTE — [facility] Note (Signed)
Pt reports to [facility] with c/o lower abd cramping since about 0600 today.  Pt also reports no fetal movement since yesterday.  FHR in triage: 145.  Pt denies vag bleeding or LOF.

## 2019-11-03 NOTE — MAU Provider Note (Signed)
History     CSN: [id number]  Arrival date and time: 11/03/19 1155   First Provider Initiated Contact with Patient 11/03/19 1250      Chief Complaint  Patient presents with  . Abdominal Pain  . Decreased Fetal Movement   [name] is a 24 y.o. G2P1001 at [redacted]w[redacted]d who presents to MAU for feeling decreased fetal movement beginning 2-3days ago. Pt reports she has not felt the baby move at all since yesterday, until the monitors were applied in MAU. Pt reports the baby is moving "way more than he was doing" but reports the movement is not yet back to normal. Pt reports the baby usually moves all day.   Last food/drink: sandwiches last night before bed, soda this AM, two fruit snacks this morning Smoker? no Current medications/supplements: none Recent AFI: 23.87cm on 10/15/2019 Anterior placenta? yes Doing FKCs? no Problems this pregnancy include: marginal cord insertion, echogenic intracardiac focus Pt denies prior instances of DFM. Pt denies all risk factors for stillbirth, including, but not limited to: IUGR, placental abruption, infection, genetic/congenital anomalies, fetomaternal hemorrhage, DM, HTN, smoking/drug use, placental abnormalities, uterine abnormalities, fetal hydrops, arrythmia, platelet dysfunction, IHCP.  Pt denies VB, LOF, ctx, vaginal discharge/odor/itching.  Allergies? Peach, NKDA Prenatal care provider/next appt? [facility], 11/07/2019   OB History    Gravida  2   Para  1   Term  1   Preterm  0   AB  0   Living  1     SAB  0   TAB  0   Ectopic  0   Multiple  0   Live Births  1           Past Medical History:  Diagnosis Date  . Asthma    pt states she doesn't use inhaler much (04/10/19)  . Gonorrhea   . Heart murmur    when younger  . Vaginal Pap smear, abnormal     Past Surgical History:  Procedure Laterality Date  . CATARACT EXTRACTION    . WISDOM TOOTH EXTRACTION    . WRIST SURGERY      Family History  Problem  Relation Age of Onset  . Hypertension Mother   . Miscarriages / [place] Mother   . Hypertension Maternal Grandmother   . Asthma Maternal Grandmother   . Diabetes Maternal Grandmother   . Vision loss Maternal Grandmother   . Mental illness Brother   . Cancer Maternal Aunt   . Heart disease Neg Hx     Social History   Tobacco Use  . Smoking status: Former Smoker    Packs/day: 0.25    Types: Cigars    Quit date: 03/11/2017    Years since quitting: 2.6  . Smokeless tobacco: Never Used  Substance Use Topics  . Alcohol use: No    Alcohol/week: 0.0 standard drinks    Comment: mimosa on saturday. 04/06/19  . Drug use: No    Allergies:  Allergies  Allergen Reactions  . Peach [Prunus Persica] Hives    Medications Prior to Admission  Medication Sig Dispense Refill Last Dose  . Prenat-Fe Poly-Methfol-FA-DHA (VITAFOL ULTRA) 29-0.6-0.4-200 MG CAPS Take 1 tablet by mouth daily. 30 capsule 12 Past Month at Unknown time  . amoxicillin-clavulanate (AUGMENTIN) 875-125 MG tablet Take 1 tablet by mouth every 12 (twelve) hours. (Patient not taking: Reported on 10/24/2019) 14 tablet 0   . cephALEXin (KEFLEX) 250 MG capsule Take 1 capsule (250 mg total) by mouth 4 (four) times daily. (  Patient not taking: Reported on 10/24/2019) 28 capsule 0     Review of Systems  Constitutional: Negative for chills, diaphoresis, fatigue and fever.  Eyes: Negative for visual disturbance.  Respiratory: Negative for shortness of breath.   Cardiovascular: Negative for chest pain.  Gastrointestinal: Negative for abdominal pain, constipation, diarrhea, nausea and vomiting.  Genitourinary: Negative for dysuria, flank pain, frequency, pelvic pain, urgency, vaginal bleeding and vaginal discharge.  Neurological: Negative for dizziness, weakness, light-headedness and headaches.   Physical Exam   Blood pressure (!) 102/52, pulse 77, temperature 98.2 F (36.8 C), temperature source Oral, resp. rate 16, height 5\' 3"   (1.6 m), weight 88.5 kg, last menstrual period 03/06/2019, SpO2 100 %, not currently breastfeeding.  Patient Vitals for the past 24 hrs:  BP Temp Temp src Pulse Resp SpO2 Height Weight  11/03/19 1451 (!) 102/52 - - 77 16 - - -  11/03/19 1206 - 98.2 F (36.8 C) Oral 100 18 - 5\' 3"  (1.6 m) 88.5 kg  11/03/19 1204 (!) 108/50 - - - - 100 % - -   Physical Exam  Constitutional: She is oriented to person, place, and time. She appears well-developed and well-nourished. No distress.  HENT:  Head: Normocephalic and atraumatic.  Respiratory: Effort normal.  Neurological: She is alert and oriented to person, place, and time.  Skin: She is not diaphoretic.  Psychiatric: She has a normal mood and affect. Her behavior is normal. Judgment and thought content normal.   No results found for this or any previous visit (from the past 24 hour(s)).  MAU Course  Procedures  MDM -DFM without other symptoms reported to provider -fetal movement increased on presenting to MAU, but not back to normal -EFM: reactive       -baseline: 140       -variability: moderate       -accels: present, 15x15       -decels: absent       -TOCO: irritability/few irregular ctx -pt given iced water to drink -clicker given, pt clicked 10 times in -BPP: 8/8, placenta right lateral, marginal cord insertion, AFI 19.73 -pt reports movement now back to normal -CE: long/closed/posterior -pt discharged to home in stable condition  Orders Placed This Encounter  Procedures  . Wet prep, genital    Standing Status:   Standing    Number of Occurrences:   1  . 01/01/20 MFM FETAL BPP WO NON STRESS    Standing Status:   Standing    Number of Occurrences:   1    Order Specific Question:   Symptom/Reason for Exam    Answer:   Decreased fetal movement  . Urinalysis, Routine w reflex microscopic    Standing Status:   Standing    Number of Occurrences:   1  . Discharge patient    Order Specific Question:   Discharge  disposition    Answer:   01-Home or Self Care [1]    Order Specific Question:   Discharge patient date    Answer:   11/03/2019   Assessment and Plan   1. Decreased fetal movement   2. Marginal insertion of umbilical cord affecting management of mother   3. Short interval between pregnancies affecting pregnancy, antepartum   4. NST (non-stress test) reactive   5. [redacted] weeks gestation of pregnancy    Allergies as of 11/03/2019      Reactions   Peach [prunus Persica] Hives      Medication List  TAKE these medications   amoxicillin-clavulanate 875-125 MG tablet Commonly known as: AUGMENTIN Take 1 tablet by mouth every 12 (twelve) hours.   cephALEXin 250 MG capsule Commonly known as: KEFLEX Take 1 capsule (250 mg total) by mouth 4 (four) times daily.   Vitafol Ultra 29-0.6-0.4-200 MG Caps Take 1 tablet by mouth daily.      -discussed that mothers do not perceive 100% of fetal movement, and there is wide variation of mother perception of fetal movement -discussed that fetal movement varies somewhat depending on the time of day and gestational age and there is a wide variety of normal movement among healthy babies -discussed that frequency of movement typically increases from morning to night, with peak activity late at night -discussed that fetal sleep cycles become longer with advancing gestation and can last about 20-25minutes -pt advised to contact HCP immediately if noticing a decrease in fetal movement compared to what is normal -discussed FKCs vs. monitoring movements; can either do fetal kick counting, or just be aware of what is normal for baby in terms of movement; no one method is better than the other -FKC: at least 10 fetal movements (FMs) over up to two hours when at rest and focused on counting -return MAU precautions given -pt discharged to home in stable condition  Elmyra Ricks E Nugent 11/03/2019, 2:55 PM

## 2019-11-04 ENCOUNTER — Other Ambulatory Visit: Payer: Self-pay

## 2019-11-04 ENCOUNTER — Inpatient Hospital Stay (HOSPITAL_COMMUNITY)
Admission: AD | Admit: 2019-11-04 | Discharge: 2019-11-04 | Disposition: A | Discharge status: Home / Self Care | Payer: Medicaid Other | Source: Ambulatory Visit | Attending: Obstetrics and Gynecology | Admitting: Obstetrics and Gynecology

## 2019-11-04 ENCOUNTER — Encounter (HOSPITAL_COMMUNITY): Payer: Self-pay | Admitting: Obstetrics and Gynecology

## 2019-11-04 DIAGNOSIS — Z87891 Personal history of nicotine dependence: Secondary | ICD-10-CM | POA: Insufficient documentation

## 2019-11-04 DIAGNOSIS — R109 Unspecified abdominal pain: Secondary | ICD-10-CM | POA: Insufficient documentation

## 2019-11-04 DIAGNOSIS — J45909 Unspecified asthma, uncomplicated: Secondary | ICD-10-CM | POA: Insufficient documentation

## 2019-11-04 DIAGNOSIS — O26893 Other specified pregnancy related conditions, third trimester: Secondary | ICD-10-CM | POA: Insufficient documentation

## 2019-11-04 DIAGNOSIS — O4703 False labor before 37 completed weeks of gestation, third trimester: Secondary | ICD-10-CM

## 2019-11-04 DIAGNOSIS — O99513 Diseases of the respiratory system complicating pregnancy, third trimester: Secondary | ICD-10-CM | POA: Insufficient documentation

## 2019-11-04 DIAGNOSIS — Z3689 Encounter for other specified antenatal screening: Secondary | ICD-10-CM

## 2019-11-04 DIAGNOSIS — Z3A34 34 weeks gestation of pregnancy: Secondary | ICD-10-CM | POA: Diagnosis not present

## 2019-11-04 LAB — URINALYSIS, ROUTINE W REFLEX MICROSCOPIC
Bilirubin Urine: NEGATIVE
Glucose, UA: NEGATIVE mg/dL
Hgb urine dipstick: NEGATIVE
Ketones, ur: NEGATIVE mg/dL
Nitrite: NEGATIVE
Protein, ur: NEGATIVE mg/dL
Specific Gravity, Urine: 1.008 (ref 1.005–1.030)
pH: 8 (ref 5.0–8.0)

## 2019-11-04 MED ORDER — NIFEDIPINE 10 MG PO CAPS
10.0000 mg | ORAL_CAPSULE | ORAL | Status: DC | PRN
  Administered 2019-11-04: 10 mg via ORAL
  Filled 2019-11-04 (×2): qty 1

## 2019-11-04 MED ORDER — ACETAMINOPHEN 500 MG PO TABS
1000.0000 mg | ORAL_TABLET | Freq: Once | ORAL | Status: AC
  Administered 2019-11-04: 1000 mg via ORAL
  Filled 2019-11-04: qty 2

## 2019-11-04 NOTE — Discharge Instructions (Signed)
Abdominal Pain During Pregnancy  Abdominal pain is common during pregnancy, and has many possible causes. Some causes are more serious than others, and sometimes the cause is not known. Abdominal pain can be a sign that labor is starting. It can also be caused by normal growth and stretching of muscles and ligaments during pregnancy. Always tell your health care provider if you have any abdominal pain. Follow these instructions at home:  Do not have sex or put anything in your vagina until your pain goes away completely.  Get plenty of rest until your pain improves.  Drink enough fluid to keep your urine pale yellow.  Take over-the-counter and prescription medicines only as told by your health care provider.  Keep all follow-up visits as told by your health care provider. This is important. Contact a health care provider if:  Your pain continues or gets worse after resting.  You have lower abdominal pain that: ? Comes and goes at regular intervals. ? Spreads to your back. ? Is similar to menstrual cramps.  You have pain or burning when you urinate. Get help right away if:  You have a fever or chills.  You have vaginal bleeding.  You are leaking fluid from your vagina.  You are passing tissue from your vagina.  You have vomiting or diarrhea that lasts for more than 24 hours.  Your baby is moving less than usual.  You feel very weak or faint.  You have shortness of breath.  You develop severe pain in your upper abdomen. Summary  Abdominal pain is common during pregnancy, and has many possible causes.  If you experience abdominal pain during pregnancy, tell your health care provider right away.  Follow your health care provider's home care instructions and keep all follow-up visits as directed. This information is not intended to replace advice given to you by your health care provider. Make sure you discuss any questions you have with your health care  provider. Document Revised: 01/29/2019 Document Reviewed: 01/13/2017 Elsevier Patient Education  2020 Elsevier Inc.  

## 2019-11-04 NOTE — MAU Provider Note (Addendum)
Chief Complaint:  Abdominal Pain   First Provider Initiated Contact with Patient 11/04/19 1938     HPI: Andrea Jenkins is a 24 y.o. G2P1001 at [redacted]w[redacted]d who presents to maternity admissions reporting abdominal cramping. Symptoms started a few days ago. Reports feeling cramping 3-4 times per hour. Had 4 episode of loose stool yesterday. No BM today. Denies n/v, fever, dysuria, vaginal bleeding, or LOF. No recent intercourse. Good fetal movement.    Location: abdomen Quality: cramping Severity: 6/10 in pain scale Duration: 2 days Timing: 3-4 times per hour Modifying factors: none Associated signs and symptoms: none   Past Medical History:  Diagnosis Date  . Asthma    pt states she doesn't use inhaler much (04/10/19)  . Gonorrhea   . Heart murmur    when younger  . Vaginal Pap smear, abnormal    OB History  Gravida Para Term Preterm AB Living  2 1 1  0 0 1  SAB TAB Ectopic Multiple Live Births  0 0 0 0 1    # Outcome Date GA Lbr Len/2nd Weight Sex Delivery Anes PTL Lv  2 Current           1 Term 01/18/18 [redacted]w[redacted]d 07:57 / 00:18 3365 g F Vag-Spont EPI  LIV   Past Surgical History:  Procedure Laterality Date  . CATARACT EXTRACTION    . WISDOM TOOTH EXTRACTION    . WRIST SURGERY     Family History  Problem Relation Age of Onset  . Hypertension Mother   . Miscarriages / [redacted]w[redacted]d Mother   . Hypertension Maternal Grandmother   . Asthma Maternal Grandmother   . Diabetes Maternal Grandmother   . Vision loss Maternal Grandmother   . Mental illness Brother   . Cancer Maternal Aunt   . Heart disease Neg Hx    Social History   Tobacco Use  . Smoking status: Former Smoker    Packs/day: 0.25    Types: Cigars    Quit date: 03/11/2017    Years since quitting: 2.6  . Smokeless tobacco: Never Used  Substance Use Topics  . Alcohol use: No    Alcohol/week: 0.0 standard drinks    Comment: mimosa on saturday. 04/06/19  . Drug use: No   Allergies  Allergen Reactions  . Peach  [Prunus Persica] Hives   Medications Prior to Admission  Medication Sig Dispense Refill Last Dose  . Prenat-Fe Poly-Methfol-FA-DHA (VITAFOL ULTRA) 29-0.6-0.4-200 MG CAPS Take 1 tablet by mouth daily. 30 capsule 12 11/04/2019 at 4pm  . amoxicillin-clavulanate (AUGMENTIN) 875-125 MG tablet Take 1 tablet by mouth every 12 (twelve) hours. (Patient not taking: Reported on 10/24/2019) 14 tablet 0   . cephALEXin (KEFLEX) 250 MG capsule Take 1 capsule (250 mg total) by mouth 4 (four) times daily. (Patient not taking: Reported on 10/24/2019) 28 capsule 0     I have reviewed patient's Past Medical Hx, Surgical Hx, Family Hx, Social Hx, medications and allergies.   ROS:  Review of Systems  Constitutional: Negative.   Gastrointestinal: Positive for abdominal pain.  Genitourinary: Negative.     Physical Exam   Patient Vitals for the past 24 hrs:  BP Temp Temp src Pulse Resp  11/04/19 1937 (!) 122/59 - - 83 -  11/04/19 1917 (!) 124/59 98.4 F (36.9 C) Oral 87 16    Constitutional: Well-developed, well-nourished female in no acute distress.  Cardiovascular: normal rate & rhythm, no murmur Respiratory: normal effort, lung sounds clear throughout GI: Abd soft, non-tender, gravid appropriate  for gestational age. Pos BS x 4 MS: Extremities nontender, no edema, normal ROM Neurologic: Alert and oriented x 4.  GU:   Dilation: Closed Effacement (%): 50 Cervical Position: Posterior Station: -2 Exam by:: Jorje Guild, NP  NST:  Baseline: 140 bpm, Variability: Good {> 6 bpm), Accelerations: Reactive, Decelerations: Absent and uterine irritability   Labs: Results for orders placed or performed during the hospital encounter of 11/04/19 (from the past 24 hour(s))  Urinalysis, Routine w reflex microscopic     Status: Abnormal   Collection Time: 11/04/19  7:35 PM  Result Value Ref Range   Color, Urine YELLOW YELLOW   APPearance HAZY (A) CLEAR   Specific Gravity, Urine 1.008 1.005 - 1.030   pH 8.0  5.0 - 8.0   Glucose, UA NEGATIVE NEGATIVE mg/dL   Hgb urine dipstick NEGATIVE NEGATIVE   Bilirubin Urine NEGATIVE NEGATIVE   Ketones, ur NEGATIVE NEGATIVE mg/dL   Protein, ur NEGATIVE NEGATIVE mg/dL   Nitrite NEGATIVE NEGATIVE   Leukocytes,Ua SMALL (A) NEGATIVE   RBC / HPF 0-5 0 - 5 RBC/hpf   WBC, UA 0-5 0 - 5 WBC/hpf   Bacteria, UA MANY (A) NONE SEEN   Squamous Epithelial / LPF 0-5 0 - 5   Mucus PRESENT     Imaging:  No results found.  MAU Course: Orders Placed This Encounter  Procedures  . Culture, OB Urine  . Urinalysis, Routine w reflex microscopic   Meds ordered this encounter  Medications  . acetaminophen (TYLENOL) tablet 1,000 mg    MDM: Reactive NST with some uterine irritability on the monitor. Cervix closed. U/a pending TYlenol & PO fluids for symptom treatment while in MAU  Care turned over to Lawrence, NP 11/04/2019 8:05 PM   Reassessment (8:55 PM) -Tylenol given. -Irritability remains. -UA returns with many bacteria and small leukocytes.  -In room to assess. -Patient reports cramping present in upper abdominal area that is intermittent.  Pain onset c/w with contractions on tocometry. -Informed of UA findings and possibility of starting antibiotics. -Will give procardia, per protocol, and reassess.   Reassessment (9:48 PM)  -S/P one dose procardia and blood pressures now near parameters so 2nd dose to be held. -Patient reports some improvement in pain, but it continues. -Cervical exam performed and unchanged. -Discussed PTL precautions. -Informed that will wait for results before giving antibiotics. -Patient verbalized understanding and has no questions or concerns. -Encouraged to call or return to MAU if symptoms worsen or with the onset of new symptoms. -Discharged to home in stable condition.  Maryann Conners MSN, CNM Advanced Practice Provider, [facility] [employer] [facility]

## 2019-11-04 NOTE — MAU Note (Signed)
Patient reports to MAU c/o abdominal cramping and vaginal pressure that is ongoing since her visit yesterday. +FM today. No bleeding or LOF.

## 2019-11-05 ENCOUNTER — Inpatient Hospital Stay (HOSPITAL_COMMUNITY)
Admission: AD | Admit: 2019-11-05 | Discharge: 2019-11-05 | Discharge status: Against Medical Advice | Payer: Medicaid Other | Attending: Obstetrics and Gynecology | Admitting: Obstetrics and Gynecology

## 2019-11-05 ENCOUNTER — Encounter (HOSPITAL_COMMUNITY): Payer: Self-pay | Admitting: Obstetrics and Gynecology

## 2019-11-05 DIAGNOSIS — O09899 Supervision of other high risk pregnancies, unspecified trimester: Secondary | ICD-10-CM

## 2019-11-05 DIAGNOSIS — O43193 Other malformation of placenta, third trimester: Secondary | ICD-10-CM

## 2019-11-05 DIAGNOSIS — R109 Unspecified abdominal pain: Secondary | ICD-10-CM | POA: Diagnosis present

## 2019-11-05 DIAGNOSIS — Z3A34 34 weeks gestation of pregnancy: Secondary | ICD-10-CM | POA: Insufficient documentation

## 2019-11-05 DIAGNOSIS — O09893 Supervision of other high risk pregnancies, third trimester: Secondary | ICD-10-CM | POA: Diagnosis not present

## 2019-11-05 DIAGNOSIS — Z87891 Personal history of nicotine dependence: Secondary | ICD-10-CM | POA: Diagnosis not present

## 2019-11-05 DIAGNOSIS — O43199 Other malformation of placenta, unspecified trimester: Secondary | ICD-10-CM

## 2019-11-05 LAB — CULTURE, OB URINE: Culture: <10000 — AB

## 2019-11-05 MED ORDER — NIFEDIPINE 10 MG PO CAPS
10.0000 mg | ORAL_CAPSULE | ORAL | Status: DC
  Filled 2019-11-05: qty 1

## 2019-11-05 MED ORDER — TERBUTALINE SULFATE 1 MG/ML IJ SOLN: 0.2500 mg | Freq: Once | INTRAMUSCULAR | Status: DC

## 2019-11-05 NOTE — MAU Note (Signed)
Started cramping almost as soon as she got home last night.  No bleeding or leaking.  Denies GU complaints.

## 2019-11-05 NOTE — BH Specialist Note (Signed)
Integrated Behavioral Health via Telemedicine Video Visit  11/05/2019 Andrea Jenkins [id number]  Number of Integrated Behavioral Health visits: 2 Session Start time: 2:17  Session End time: 2:43 Total time: 26  Referring Provider: Clovia Cuff, MD Type of Visit: Video Patient/Family location: Home [facility] Provider location: [facility] All persons participating in visit: Patient Andrea Jenkins and Andrea Jenkins    Confirmed patient's address: Yes  Confirmed patient's phone number: Yes  Any changes to demographics: No   Confirmed patient's insurance: Yes  Any changes to patient's insurance: No   Discussed confidentiality: At previous visit  I connected with Andrea Jenkins  by a video enabled telemedicine application and verified that I am speaking with the correct person using two identifiers.     I discussed the limitations of evaluation and management by telemedicine and the availability of in person appointments.  I discussed that the purpose of this visit is to provide behavioral health care while limiting exposure to the novel coronavirus.   Discussed there is a possibility of technology failure and discussed alternative modes of communication if that failure occurs.  I discussed that engaging in this video visit, they consent to the provision of behavioral healthcare and the services will be billed under their insurance.  Patient and/or legal guardian expressed understanding and consented to video visit: Yes   PRESENTING CONCERNS: Patient and/or family reports the following symptoms/concerns: Pt states her primary goal is to "be induced" as soon as possible, feels it is "not healthy to be home by myself", is having contractions and back pain with increased irritability. Pt attributes symptoms to pregnancy, and says the only thing that will make her feel better is induction.  Duration of problem: Ongoing with increase in pregnancy; Severity of problem: severe  STRENGTHS (Protective  Factors/Coping Skills): Open to treatment  GOALS ADDRESSED: Patient will: 1.  Reduce symptoms of: anxiety, depression and stress  2.  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   Patient may benefit from continued psychoeducation and brief therapeutic interventions regarding coping with symptoms of depression and anxiety .  PLAN: 1. Follow up with behavioral health clinician on : Two weeks or postpartum 2. Behavioral recommendations:  -Sign [email or web address] consent for food and utility referrals today -Discussion with medical provider at 11/07/19 visit about induction options available, and pros/cons of inducing early -Consider using self-coping strategies(deep breathing, apps as distraction, talk to sister daily) 3. Referral(s): Integrated Orthoptist (In Clinic) and [employer] Resources:  Food and Utilities  I discussed the assessment and treatment plan with the patient and/or parent/guardian. They were provided an opportunity to ask questions and all were answered. They agreed with the plan and demonstrated an understanding of the instructions.   They were advised to call back or seek an in-person evaluation if the symptoms worsen or if the condition fails to improve as anticipated.  Caroleen Hamman Anu Stagner  Depression screen [facility] 2/9 10/29/2019  Decreased Interest 3  Down, Depressed, Hopeless 2  PHQ - 2 Score 5  Altered sleeping 3  Tired, decreased energy 3  Change in appetite 3  Feeling bad or failure about yourself  1  Trouble concentrating 1  Moving slowly or fidgety/restless 0  Suicidal thoughts 0  PHQ-9 Score 16  ] GAD 7 : Generalized Anxiety Score 10/29/2019  Nervous, Anxious, on Edge 3   Control/stop worrying 3  Worry too much - different things 3  Trouble relaxing 3  Restless 0  Easily annoyed or irritable 3  Afraid - awful might happen 0  Total GAD 7 Score 15

## 2019-11-05 NOTE — MAU Provider Note (Signed)
History     CSN: [id number]  Arrival date and time: 11/05/19 5329   First Provider Initiated Contact with Patient 11/05/19 1010      Chief Complaint  Patient presents with  . Abdominal Pain   23 y.o. G2P1001 @34 .6 wks presenting for the third time in the last 2 days for cramping. Reports originally starting about 1 week ago. Describes as cramping in bilateral lower abdomen. She is unsure of frequiency but duration is a few seconds. Rates pain 6/10. Has not taken anything for it. She is eating and drinking well, had 1 bottle of water today and cereal. Denies VB, LOF, or discharge. Denies all urinary sx. +FM.   OB History    Gravida  2   Para  1   Term  1   Preterm  0   AB  0   Living  1     SAB  0   TAB  0   Ectopic  0   Multiple  0   Live Births  1           Past Medical History:  Diagnosis Date  . Asthma    pt states she doesn't use inhaler much (04/10/19)  . Gonorrhea   . Heart murmur    when younger  . Vaginal Pap smear, abnormal     Past Surgical History:  Procedure Laterality Date  . CATARACT EXTRACTION    . WISDOM TOOTH EXTRACTION    . WRIST SURGERY      Family History  Problem Relation Age of Onset  . Hypertension Mother   . Miscarriages / 04/12/19 Mother   . Hypertension Maternal Grandmother   . Asthma Maternal Grandmother   . Diabetes Maternal Grandmother   . Vision loss Maternal Grandmother   . Mental illness Brother   . Cancer Maternal Aunt   . Heart disease Neg Hx     Social History   Tobacco Use  . Smoking status: Former Smoker    Packs/day: 0.25    Types: Cigars    Quit date: 03/11/2017    Years since quitting: 2.6  . Smokeless tobacco: Never Used  Substance Use Topics  . Alcohol use: No    Alcohol/week: 0.0 standard drinks    Comment: mimosa on saturday. 04/06/19  . Drug use: No    Allergies:  Allergies  Allergen Reactions  . Peach [Prunus Persica] Hives    Medications Prior to Admission  Medication Sig  Dispense Refill Last Dose  . Prenat-Fe Poly-Methfol-FA-DHA (VITAFOL ULTRA) 29-0.6-0.4-200 MG CAPS Take 1 tablet by mouth daily. 30 capsule 12 11/04/2019 at Unknown time    Review of Systems  Gastrointestinal: Positive for abdominal pain.  Genitourinary: Negative for dysuria, frequency, hematuria, urgency, vaginal bleeding and vaginal discharge.   Physical Exam   Height 5\' 3"  (1.6 m), weight 87.5 kg, last menstrual period 03/06/2019, SpO2 100 %, not currently breastfeeding.  Physical Exam  Nursing note and vitals reviewed. Constitutional: She is oriented to person, place, and time. She appears well-developed and well-nourished. No distress.  HENT:  Head: Normocephalic.  Cardiovascular: Normal rate.  Respiratory: Effort normal. No respiratory distress.  GI: Soft. She exhibits no distension. There is no abdominal tenderness.  Genitourinary:    Genitourinary Comments: VE: closed/thick   Musculoskeletal:        General: Normal range of motion.     Cervical back: Normal range of motion.  Neurological: She is alert and oriented to person, place, and time.  Skin:  Skin is warm and dry.  Psychiatric: She has a normal mood and affect.  EFM: 140 bpm, mod variability, + accels, no decels Toco: irritability  Results for orders placed or performed during the hospital encounter of 11/04/19 (from the past 24 hour(s))  Urinalysis, Routine w reflex microscopic     Status: Abnormal   Collection Time: 11/04/19  7:35 PM  Result Value Ref Range   Color, Urine YELLOW YELLOW   APPearance HAZY (A) CLEAR   Specific Gravity, Urine 1.008 1.005 - 1.030   pH 8.0 5.0 - 8.0   Glucose, UA NEGATIVE NEGATIVE mg/dL   Hgb urine dipstick NEGATIVE NEGATIVE   Bilirubin Urine NEGATIVE NEGATIVE   Ketones, ur NEGATIVE NEGATIVE mg/dL   Protein, ur NEGATIVE NEGATIVE mg/dL   Nitrite NEGATIVE NEGATIVE   Leukocytes,Ua SMALL (A) NEGATIVE   RBC / HPF 0-5 0 - 5 RBC/hpf   WBC, UA 0-5 0 - 5 WBC/hpf   Bacteria, UA MANY (A)  NONE SEEN   Squamous Epithelial / LPF 0-5 0 - 5   Mucus PRESENT    MAU Course  Procedures Meds ordered this encounter  Medications  . DISCONTD: NIFEdipine (PROCARDIA) capsule 10 mg  . terbutaline (BRETHINE) injection 0.25 mg   MDM Labs ordered and reviewed. Pt refuses all tocolytics (had HA after Procardia and refuses Terbutaline). Per RN pt refuses to stay for observation and cervical recheck, AMA paper signed. Pt left before provider could talk with her.  Assessment and Plan  [redacted] weeks gestation Preterm contractions Left AMA  Julianne Handler, CNM 11/05/2019, 10:19 AM

## 2019-11-05 NOTE — MAU Note (Signed)
Patient requesting to leave AMA. Alternative care measures were offered. Patient was notified of risks of leaving the hospital and was notified to return if her sx worsen. AMA paper was signed.

## 2019-11-06 ENCOUNTER — Ambulatory Visit (INDEPENDENT_AMBULATORY_CARE_PROVIDER_SITE_OTHER): Payer: Medicaid Other | Admitting: Clinical

## 2019-11-06 DIAGNOSIS — Z658 Other specified problems related to psychosocial circumstances: Secondary | ICD-10-CM

## 2019-11-06 DIAGNOSIS — F4323 Adjustment disorder with mixed anxiety and depressed mood: Secondary | ICD-10-CM | POA: Diagnosis not present

## 2019-11-06 NOTE — Addendum Note (Signed)
Addended by: Hulda Marin C on: 11/06/2019 03:49 PM   Modules accepted: Level of Service

## 2019-11-07 ENCOUNTER — Other Ambulatory Visit: Payer: Self-pay

## 2019-11-07 ENCOUNTER — Ambulatory Visit (INDEPENDENT_AMBULATORY_CARE_PROVIDER_SITE_OTHER): Payer: Medicaid Other

## 2019-11-07 VITALS — BP 116/69 | HR 97 | Wt 192.0 lb

## 2019-11-07 DIAGNOSIS — O0990 Supervision of high risk pregnancy, unspecified, unspecified trimester: Secondary | ICD-10-CM | POA: Insufficient documentation

## 2019-11-07 DIAGNOSIS — O4703 False labor before 37 completed weeks of gestation, third trimester: Secondary | ICD-10-CM | POA: Insufficient documentation

## 2019-11-07 DIAGNOSIS — Z3A35 35 weeks gestation of pregnancy: Secondary | ICD-10-CM

## 2019-11-07 DIAGNOSIS — O0993 Supervision of high risk pregnancy, unspecified, third trimester: Secondary | ICD-10-CM

## 2019-11-07 MED ORDER — CYCLOBENZAPRINE HCL 5 MG PO TABS: 5.0000 mg | ORAL_TABLET | Freq: Every evening | ORAL | 0 refills | Status: DC | PRN

## 2019-11-07 NOTE — Patient Instructions (Signed)
Group B Streptococcus Test During Pregnancy Why am I having this test? Routine testing, also called screening, for group B streptococcus (GBS) is recommended for all pregnant women between the 36th and 37th week of pregnancy. GBS is a type of bacteria that can be passed from mother to baby during childbirth. Screening will help guide whether or not you will need treatment during labor and delivery to prevent complications such as:  An infection in your uterus during labor.  An infection in your uterus after delivery.  A serious infection in your baby after delivery, such as pneumonia, meningitis, or sepsis. GBS screening is not often done before 36 weeks of pregnancy unless you go into labor prematurely. What happens if I have group B streptococcus? If testing shows that you have GBS, your health care provider will recommend treatment with IV antibiotics during labor and delivery. This treatment significantly decreases the risk of complications for you and your baby. If you have a planned C-section and you have GBS, you may not need to be treated with antibiotics because GBS is usually passed to babies after labor starts and your water breaks. If you are in labor or your water breaks before your C-section, it is possible for GBS to get into your uterus and be passed to your baby, so you might need treatment. Is there a chance I may not need to be tested? You may not need to be tested for GBS if:  You have a urine test that shows GBS before 36 to 37 weeks.  You had a baby with GBS infection after a previous delivery. In these cases, you will automatically be treated for GBS during labor and delivery. What is being tested? This test is done to check if you have group B streptococcus in your vagina or rectum. What kind of sample is taken? To collect samples for this test, your health care provider will swab your vagina and rectum with a cotton swab. The sample is then sent to the lab to see if  GBS is present. What happens during the test?   You will remove your clothing from the waist down.  You will lie down on an exam table in the same position as you would for a pelvic exam.  Your health care provider will swab your vagina and rectum to collect samples for a culture test.  You will be able to go home after the test and do all your usual activities. How are the results reported? The test results are reported as positive or negative. What do the results mean?  A positive test means you are at risk for passing GBS to your baby during labor and delivery. Your health care provider will recommend that you are treated with an IV antibiotic during labor and delivery.  A negative test means you are at very low risk of passing GBS to your baby. There is still a low risk of passing GBS to your baby because sometimes test results may report that you do not have a condition when you do (false-negative result) or there is a chance that you may become infected with GBS after the test is done. You most likely will not need to be treated with an antibiotic during labor and delivery. Talk with your health care provider about what your results mean. Questions to ask your health care provider Ask your health care provider, or the department that is doing the test:  When will my results be ready?  How will I   get my results?  What are my treatment options? Summary  Routine testing (screening) for group B streptococcus (GBS) is recommended for all pregnant women between the 36th and 37th week of pregnancy.  GBS is a type of bacteria that can be passed from mother to baby during childbirth.  If testing shows that you have GBS, your health care provider will recommend that you are treated with IV antibiotics during labor and delivery. This treatment almost always prevents infection in newborns. This information is not intended to replace advice given to you by your health care provider. Make  sure you discuss any questions you have with your health care provider. Document Revised: 02/01/2019 Document Reviewed: 11/08/2018 Elsevier Patient Education  2020 [employer]  

## 2019-11-07 NOTE — Progress Notes (Signed)
ROB  Wants cervix check

## 2019-11-07 NOTE — Progress Notes (Signed)
   PRENATAL VISIT NOTE  Subjective:  Andrea Jenkins is a 24 y.o. G2P1001 at [redacted]w[redacted]d who presents today for routine prenatal care.  She is currently being monitored for supervision of a high-risk pregnancy with problems as listed below.  Patient reports contractions every 5 minutes, but states she is not taking anything.  Patient rates the contractions a 6/10. Of note patient has been seen in the MAU x 2 for this complaint in the last week. She endorses fetal movement and denies vaginal concerns including discharge, bleeding, leaking, itching, and burning. Patient endorses safety at home and denies SI/HI behaviors. Patient questions if she can be induced soon.  She reports that she delivered, her previous baby, at 39.6 weeks after membrane sweeping.   Patient Active Problem List   Diagnosis Date Noted  . Alpha thalassemia silent carrier 10/24/2019  . Fetal echogenic intracardiac focus on prenatal ultrasound 08/31/2019  . Marginal insertion of umbilical cord affecting management of mother 07/19/2019  . Short interval between pregnancies affecting pregnancy, antepartum 05/30/2019  . Supervision of other normal pregnancy, antepartum 05/15/2019  . 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 Problem list updated.  Objective:   Vitals:   11/07/19 1137  BP: 116/69  Pulse: 97  Weight: 192 lb (87.1 kg)    Fetal Status: Fetal Heart Rate (bpm): 156 Fundal Height: 36 cm Movement: Present     General:  Alert, oriented and cooperative. Patient is in no acute distress.  Skin: Skin is warm and dry.   Cardiovascular: Regular rate and rhythm.  Respiratory: Normal respiratory effort. CTA-Bilaterally  Abdomen: Soft, gravid, appropriate for  gestational age.  Pelvic: Cervical exam performed Dilation: Closed Effacement (%): 50 Station: Ballotable  Extremities: Normal range of motion.  Edema: None  Mental Status: Normal mood and affect. Normal behavior. Normal judgment and thought content.   Assessment and Plan:  Pregnancy: G2P1001 at [redacted]w[redacted]d  1. Supervision of high risk pregnancy, antepartum -Discussed that induction of labor rarely occurs prior to due date in healthy, pregnancy. -Empathy given for patient feeling of "being over it." -Anticipatory guidance for upcoming appts. -Educated on GBS bacteria including what it is, why we test, and how and when we treat if needed. -Discussed that this would be performed at next visit.   2. Preterm uterine contractions in third trimester, antepartum -Discussed contractions and discomfort. -Due to low normal bp, not a candidate for procardia. -Discussed usage of flexeril for temporary relief. -Instructed to start low dose (5mg ) at bedtime and okay to increase to 10mg  if needed. -Rx for Flexeril 5mg , Disp 15 RF 0 sent to pharmacy on file.    Preterm labor symptoms and general obstetric precautions including but not limited to vaginal bleeding, contractions, leaking of fluid and fetal movement were reviewed with the patient.  Please refer to After Visit Summary for other counseling recommendations.  Return in about 1 week (around 11/14/2019) for hR-ROB with GBS.  Future Appointments  Date Time Provider Department Center  11/08/2019  3:00 PM [facility] NURSE [facility]  11/08/2019  3:00 PM [facility] 11/16/2019 3 [facility]  11/14/2019  2:15 PM Constant, 11/10/2019, MD [facility] None    [place], CNM 11/07/2019, 12:12 PM

## 2019-11-08 ENCOUNTER — Other Ambulatory Visit (HOSPITAL_COMMUNITY): Payer: Self-pay | Admitting: Obstetrics

## 2019-11-08 ENCOUNTER — Ambulatory Visit (HOSPITAL_COMMUNITY): Payer: Medicaid Other | Admitting: *Deleted

## 2019-11-08 ENCOUNTER — Encounter (HOSPITAL_COMMUNITY): Payer: Self-pay

## 2019-11-08 ENCOUNTER — Other Ambulatory Visit (HOSPITAL_COMMUNITY): Payer: Self-pay | Admitting: *Deleted

## 2019-11-08 ENCOUNTER — Ambulatory Visit (HOSPITAL_COMMUNITY)
Admission: RE | Admit: 2019-11-08 | Discharge: 2019-11-08 | Disposition: A | Discharge status: Home / Self Care | Payer: Medicaid Other | Source: Ambulatory Visit | Attending: Obstetrics | Admitting: Obstetrics

## 2019-11-08 DIAGNOSIS — O43193 Other malformation of placenta, third trimester: Secondary | ICD-10-CM

## 2019-11-08 DIAGNOSIS — O09899 Supervision of other high risk pregnancies, unspecified trimester: Secondary | ICD-10-CM | POA: Diagnosis not present

## 2019-11-08 DIAGNOSIS — Z3A35 35 weeks gestation of pregnancy: Secondary | ICD-10-CM

## 2019-11-08 DIAGNOSIS — O359XX0 Maternal care for (suspected) fetal abnormality and damage, unspecified, not applicable or unspecified: Secondary | ICD-10-CM | POA: Diagnosis not present

## 2019-11-08 DIAGNOSIS — O409XX0 Polyhydramnios, unspecified trimester, not applicable or unspecified: Secondary | ICD-10-CM

## 2019-11-08 DIAGNOSIS — O43199 Other malformation of placenta, unspecified trimester: Secondary | ICD-10-CM | POA: Diagnosis not present

## 2019-11-08 DIAGNOSIS — Z362 Encounter for other antenatal screening follow-up: Secondary | ICD-10-CM

## 2019-11-09 ENCOUNTER — Telehealth: Payer: Self-pay

## 2019-11-09 NOTE — Telephone Encounter (Signed)
Pt called and reports that she "lost her mucous plug". Pt reports contractions every 5 minutes since yesterday lasting for at least 30 seconds. Pt denies LOF or bleeding, she reports good fetal movement. I advised pt to go be evaluated at the hospital for labor, pt voices understanding.

## 2019-11-13 ENCOUNTER — Ambulatory Visit (HOSPITAL_COMMUNITY): Payer: Medicaid Other

## 2019-11-14 ENCOUNTER — Ambulatory Visit (INDEPENDENT_AMBULATORY_CARE_PROVIDER_SITE_OTHER): Payer: Medicaid Other | Admitting: Obstetrics and Gynecology

## 2019-11-14 ENCOUNTER — Other Ambulatory Visit (HOSPITAL_COMMUNITY)
Admission: RE | Admit: 2019-11-14 | Discharge: 2019-11-14 | Disposition: A | Discharge status: Home / Self Care | Payer: Medicaid Other | Source: Ambulatory Visit | Attending: Obstetrics and Gynecology | Admitting: Obstetrics and Gynecology

## 2019-11-14 ENCOUNTER — Other Ambulatory Visit: Payer: Self-pay

## 2019-11-14 ENCOUNTER — Encounter: Payer: Self-pay | Admitting: Obstetrics and Gynecology

## 2019-11-14 VITALS — BP 119/69 | HR 101 | Wt 197.0 lb

## 2019-11-14 DIAGNOSIS — O0990 Supervision of high risk pregnancy, unspecified, unspecified trimester: Secondary | ICD-10-CM | POA: Diagnosis not present

## 2019-11-14 DIAGNOSIS — O0993 Supervision of high risk pregnancy, unspecified, third trimester: Secondary | ICD-10-CM

## 2019-11-14 DIAGNOSIS — O403XX0 Polyhydramnios, third trimester, not applicable or unspecified: Secondary | ICD-10-CM

## 2019-11-14 DIAGNOSIS — O43193 Other malformation of placenta, third trimester: Secondary | ICD-10-CM

## 2019-11-14 DIAGNOSIS — Z3A36 36 weeks gestation of pregnancy: Secondary | ICD-10-CM

## 2019-11-14 DIAGNOSIS — O43199 Other malformation of placenta, unspecified trimester: Secondary | ICD-10-CM

## 2019-11-14 NOTE — Progress Notes (Signed)
ROB   CC: Headaches, pain and pressure   Pt wants cervix check

## 2019-11-14 NOTE — Progress Notes (Signed)
   PRENATAL VISIT NOTE  Subjective:  Andrea Jenkins is a 24 y.o. G2P1001 at [redacted]w[redacted]d being seen today for ongoing prenatal care.  She is currently monitored for the following issues for this high-risk pregnancy and has LGSIL on Pap smear of cervix; Blindness of right eye; Supervision of other normal pregnancy, antepartum; Short interval between pregnancies affecting pregnancy, antepartum; Marginal insertion of umbilical cord affecting management of mother; Fetal echogenic intracardiac focus on prenatal ultrasound; Alpha thalassemia silent carrier; Preterm uterine contractions in third trimester, antepartum; Supervision of high risk pregnancy, antepartum; and Polyhydramnios affecting pregnancy in third trimester on their problem list.  Patient reports headache and pelvic pressure.  Contractions: Irritability. Vag. Bleeding: None.  Movement: Present. Denies leaking of fluid.   The following portions of the patient's history were reviewed and updated as appropriate: allergies, current medications, past family history, past medical history, past social history, past surgical history and problem list.   Objective:   Vitals:   11/14/19 1408  BP: 119/69  Pulse: (!) 101  Weight: 197 lb (89.4 kg)    Fetal Status: Fetal Heart Rate (bpm): 145 Fundal Height: 37 cm Movement: Present     General:  Alert, oriented and cooperative. Patient is in no acute distress.  Skin: Skin is warm and dry. No rash noted.   Cardiovascular: Normal heart rate noted  Respiratory: Normal respiratory effort, no problems with respiration noted  Abdomen: Soft, gravid, appropriate for gestational age.  Pain/Pressure: Present     Pelvic: Cervical exam performed Dilation: Closed Effacement (%): 50 Station: Ballotable  Extremities: Normal range of motion.  Edema: None  Mental Status: Normal mood and affect. Normal behavior. Normal judgment and thought content.   Assessment and Plan:  Pregnancy: G2P1001 at [redacted]w[redacted]d 1. Supervision  of high risk pregnancy, antepartum Patient is doing well without complaints Cultures today Patient did not take tylenol for HA. Encouraged hydration and tylenol and to contact us if no improvement  2. Marginal insertion of umbilical cord affecting management of mother Normal EFW on 1/14   3. Polyhydramnios affecting pregnancy in third trimester Follow up weekly BPP  Preterm labor symptoms and general obstetric precautions including but not limited to vaginal bleeding, contractions, leaking of fluid and fetal movement were reviewed in detail with the patient. Please refer to After Visit Summary for other counseling recommendations.   Return in about 1 week (around 11/21/2019) for Virtual, ROB, High risk.  Future Appointments  Date Time Provider Department Center  11/16/2019  7:30 AM [facility] NURSE [facility]  11/16/2019  7:30 AM [facility] [place] 2 [facility]  11/23/2019 10:10 AM [facility] NURSE [facility]  11/23/2019 10:15 AM [facility] [place] 4 [facility]    Catalina Antigua, MD

## 2019-11-15 LAB — CERVICOVAGINAL ANCILLARY ONLY
Chlamydia: NEGATIVE
Comment: NEGATIVE
Comment: NEGATIVE
Comment: NORMAL
Neisseria Gonorrhea: NEGATIVE
Trichomonas: NEGATIVE

## 2019-11-16 ENCOUNTER — Ambulatory Visit (HOSPITAL_COMMUNITY)
Admission: RE | Admit: 2019-11-16 | Discharge: 2019-11-16 | Disposition: A | Discharge status: Home / Self Care | Payer: Medicaid Other | Source: Ambulatory Visit | Attending: Obstetrics and Gynecology | Admitting: Obstetrics and Gynecology

## 2019-11-16 ENCOUNTER — Ambulatory Visit (HOSPITAL_COMMUNITY): Payer: Medicaid Other | Admitting: *Deleted

## 2019-11-16 ENCOUNTER — Encounter (HOSPITAL_COMMUNITY): Payer: Self-pay

## 2019-11-16 ENCOUNTER — Other Ambulatory Visit: Payer: Self-pay

## 2019-11-16 DIAGNOSIS — O43193 Other malformation of placenta, third trimester: Secondary | ICD-10-CM | POA: Diagnosis not present

## 2019-11-16 DIAGNOSIS — O359XX0 Maternal care for (suspected) fetal abnormality and damage, unspecified, not applicable or unspecified: Secondary | ICD-10-CM | POA: Diagnosis not present

## 2019-11-16 DIAGNOSIS — O403XX0 Polyhydramnios, third trimester, not applicable or unspecified: Secondary | ICD-10-CM

## 2019-11-16 DIAGNOSIS — Z3A36 36 weeks gestation of pregnancy: Secondary | ICD-10-CM | POA: Diagnosis not present

## 2019-11-16 DIAGNOSIS — O09899 Supervision of other high risk pregnancies, unspecified trimester: Secondary | ICD-10-CM | POA: Diagnosis not present

## 2019-11-16 DIAGNOSIS — O409XX0 Polyhydramnios, unspecified trimester, not applicable or unspecified: Secondary | ICD-10-CM | POA: Diagnosis not present

## 2019-11-16 DIAGNOSIS — O43199 Other malformation of placenta, unspecified trimester: Secondary | ICD-10-CM | POA: Diagnosis not present

## 2019-11-16 LAB — STREP GP B NAA: Strep Gp B NAA: POSITIVE — AB

## 2019-11-19 ENCOUNTER — Encounter: Payer: Self-pay | Admitting: Obstetrics and Gynecology

## 2019-11-19 DIAGNOSIS — O99820 Streptococcus B carrier state complicating pregnancy: Secondary | ICD-10-CM | POA: Insufficient documentation

## 2019-11-20 ENCOUNTER — Telehealth: Payer: Self-pay | Admitting: Clinical

## 2019-11-20 NOTE — Telephone Encounter (Signed)
Follow up call, as agreed-upon by pt at last visit; Left HIPPA-compliant message to call back Asher Muir from [facility] [employer] at [phone].

## 2019-11-20 NOTE — Telephone Encounter (Signed)
Pt returned call, is mildly frustrated that she may not be induced for another 2 weeks; encouraged pt to schedule activities for self-care daily for the next week, as a distraction, and to help her relax and laugh (both helpful for labor to begin); pt agrees to pick out a comedy to watch tonight, put up her feet, and get up and move around during commercials(walk or dance around the house).

## 2019-11-21 ENCOUNTER — Other Ambulatory Visit: Payer: Self-pay

## 2019-11-21 ENCOUNTER — Encounter: Payer: Self-pay | Admitting: Obstetrics and Gynecology

## 2019-11-21 ENCOUNTER — Ambulatory Visit (INDEPENDENT_AMBULATORY_CARE_PROVIDER_SITE_OTHER): Payer: Medicaid Other | Admitting: Obstetrics and Gynecology

## 2019-11-21 VITALS — BP 120/76 | HR 97 | Wt 193.0 lb

## 2019-11-21 DIAGNOSIS — O283 Abnormal ultrasonic finding on antenatal screening of mother: Secondary | ICD-10-CM

## 2019-11-21 DIAGNOSIS — O43199 Other malformation of placenta, unspecified trimester: Secondary | ICD-10-CM

## 2019-11-21 DIAGNOSIS — Z3480 Encounter for supervision of other normal pregnancy, unspecified trimester: Secondary | ICD-10-CM

## 2019-11-21 DIAGNOSIS — O43193 Other malformation of placenta, third trimester: Secondary | ICD-10-CM

## 2019-11-21 DIAGNOSIS — O09899 Supervision of other high risk pregnancies, unspecified trimester: Secondary | ICD-10-CM

## 2019-11-21 DIAGNOSIS — O403XX0 Polyhydramnios, third trimester, not applicable or unspecified: Secondary | ICD-10-CM

## 2019-11-21 DIAGNOSIS — O99820 Streptococcus B carrier state complicating pregnancy: Secondary | ICD-10-CM

## 2019-11-21 DIAGNOSIS — O09893 Supervision of other high risk pregnancies, third trimester: Secondary | ICD-10-CM

## 2019-11-21 DIAGNOSIS — Z3A37 37 weeks gestation of pregnancy: Secondary | ICD-10-CM

## 2019-11-21 NOTE — Progress Notes (Addendum)
   PRENATAL VISIT NOTE  Subjective:  Andrea Jenkins is a 24 y.o. G2P1001 at [redacted]w[redacted]d being seen today for ongoing prenatal care.  She is currently monitored for the following issues for this high-risk pregnancy and has LGSIL on Pap smear of cervix; Blindness of right eye; Supervision of other normal pregnancy, antepartum; Short interval between pregnancies affecting pregnancy, antepartum; Marginal insertion of umbilical cord affecting management of mother; Fetal echogenic intracardiac focus on prenatal ultrasound; Alpha thalassemia silent carrier; Preterm uterine contractions in third trimester, antepartum; Polyhydramnios affecting pregnancy in third trimester; and GBS (group B Streptococcus carrier), +RV culture, currently pregnant on their problem list.  Patient reports contractions, pelvic pain and pressure.  Contractions: Irregular.  .  Movement: Present. Denies leaking of fluid.   The following portions of the patient's history were reviewed and updated as appropriate: allergies, current medications, past family history, past medical history, past social history, past surgical history and problem list.   Objective:   Vitals:   11/21/19 1056  BP: 120/76  Pulse: 97  Weight: 193 lb (87.5 kg)    Fetal Status: Fetal Heart Rate (bpm): 135   Movement: Present     General:  Alert, oriented and cooperative. Patient is in no acute distress.  Skin: Skin is warm and dry. No rash noted.   Cardiovascular: Normal heart rate noted  Respiratory: Normal respiratory effort, no problems with respiration noted  Abdomen: Soft, gravid, appropriate for gestational age.  Pain/Pressure: Present     Pelvic: Cervical exam performed      fingertip/soft/thick/high  Extremities: Normal range of motion.     Mental Status: Normal mood and affect. Normal behavior. Normal judgment and thought content.   Assessment and Plan:  Pregnancy: G2P1001 at [redacted]w[redacted]d  1. Supervision of other normal pregnancy, antepartum Would  like to consider elective induction at 39 weeks Fingertip today Will recheck cervix next week and discuss Reviewed hospital policies regarding visitors, masking, COVID testing, etc.  2. Marginal insertion of umbilical cord affecting management of mother Normal growth  3. Fetal echogenic intracardiac focus on prenatal ultrasound  4. Short interval between pregnancies affecting pregnancy, antepartum  5. GBS (group B Streptococcus carrier), +RV culture, currently pregnant ppx in labor  6. Polyhydramnios affecting pregnancy in third trimester resolved  Term labor symptoms and general obstetric precautions including but not limited to vaginal bleeding, contractions, leaking of fluid and fetal movement were reviewed in detail with the patient. Please refer to After Visit Summary for other counseling recommendations.   Return in about 1 week (around 11/28/2019) for high OB, in person.  Future Appointments  Date Time Provider Department Center  11/28/2019 10:30 AM Conan Bowens, MD [facility] None  12/05/2019 10:30 AM Adam Phenix, MD [facility] None  12/12/2019 10:30 AM Adam Phenix, MD [facility] None    Conan Bowens, MD

## 2019-11-23 ENCOUNTER — Ambulatory Visit (HOSPITAL_COMMUNITY): Payer: Medicaid Other

## 2019-11-23 ENCOUNTER — Other Ambulatory Visit: Payer: Self-pay

## 2019-11-23 ENCOUNTER — Inpatient Hospital Stay (HOSPITAL_COMMUNITY)
Admission: AD | Admit: 2019-11-23 | Discharge: 2019-11-23 | Disposition: A | Discharge status: Home / Self Care | Payer: Medicaid Other | Attending: Obstetrics & Gynecology | Admitting: Obstetrics & Gynecology

## 2019-11-23 ENCOUNTER — Encounter (HOSPITAL_COMMUNITY): Payer: Self-pay | Admitting: Obstetrics & Gynecology

## 2019-11-23 DIAGNOSIS — O471 False labor at or after 37 completed weeks of gestation: Secondary | ICD-10-CM | POA: Diagnosis not present

## 2019-11-23 DIAGNOSIS — Z3A37 37 weeks gestation of pregnancy: Secondary | ICD-10-CM

## 2019-11-23 DIAGNOSIS — O368130 Decreased fetal movements, third trimester, not applicable or unspecified: Secondary | ICD-10-CM

## 2019-11-23 DIAGNOSIS — O479 False labor, unspecified: Secondary | ICD-10-CM

## 2019-11-23 DIAGNOSIS — Z3689 Encounter for other specified antenatal screening: Secondary | ICD-10-CM

## 2019-11-23 NOTE — MAU Note (Signed)
Presents with c/o DFM and ctxs.  Reports ctxs are 5-10 minutes apart.  Denies VB or LOF.  Reports only feeling baby move once or twice a day, after drinking "midwife brew" on Wednesday morning.

## 2019-11-23 NOTE — MAU Note (Signed)
I have communicated with J. Rasch, NP and reviewed vital signs:  Vitals:   11/23/19 1203 11/23/19 1334  BP: (!) 115/51 (!) 111/50  Pulse: 74 67  Resp: 20 18  Temp: 98.1 F (36.7 C) 98 F (36.7 C)  SpO2: 100% 100%    Vaginal exam:  Dilation: 1 Effacement (%): 70 Cervical Position: Posterior Station: -2 Presentation: Vertex Exam by:: F. Lorely Bubb, RNC,   Also reviewed contraction pattern and that non-stress test is reactive.  It has been documented that patient is contracting irregularly with minimal cervical change over 1 hour 20 minutes not indicating active labor.  Patient denies any other complaints.  Based on this report provider has given order for discharge.  A discharge order and diagnosis entered by a provider.   Labor discharge instructions reviewed with patient.

## 2019-11-23 NOTE — Discharge Instructions (Signed)
Braxton Hicks Contractions Contractions of the uterus can occur throughout pregnancy, but they are not always a sign that you are in labor. You may have practice contractions called Braxton Hicks contractions. These false labor contractions are sometimes confused with true labor. What are Braxton Hicks contractions? Braxton Hicks contractions are tightening movements that occur in the muscles of the uterus before labor. Unlike true labor contractions, these contractions do not result in opening (dilation) and thinning of the cervix. Toward the end of pregnancy (32-34 weeks), Braxton Hicks contractions can happen more often and may become stronger. These contractions are sometimes difficult to tell apart from true labor because they can be very uncomfortable. You should not feel embarrassed if you go to the hospital with false labor. Sometimes, the only way to tell if you are in true labor is for your health care provider to look for changes in the cervix. The health care provider will do a physical exam and may monitor your contractions. If you are not in true labor, the exam should show that your cervix is not dilating and your water has not broken. If there are no other health problems associated with your pregnancy, it is completely safe for you to be sent home with false labor. You may continue to have Braxton Hicks contractions until you go into true labor. How to tell the difference between true labor and false labor True labor  Contractions last 30-70 seconds.  Contractions become very regular.  Discomfort is usually felt in the top of the uterus, and it spreads to the lower abdomen and low back.  Contractions do not go away with walking.  Contractions usually become more intense and increase in frequency.  The cervix dilates and gets thinner. False labor  Contractions are usually shorter and not as strong as true labor contractions.  Contractions are usually irregular.  Contractions  are often felt in the front of the lower abdomen and in the groin.  Contractions may go away when you walk around or change positions while lying down.  Contractions get weaker and are shorter-lasting as time goes on.  The cervix usually does not dilate or become thin. Follow these instructions at home:   Take over-the-counter and prescription medicines only as told by your health care provider.  Keep up with your usual exercises and follow other instructions from your health care provider.  Eat and drink lightly if you think you are going into labor.  If Braxton Hicks contractions are making you uncomfortable: ? Change your position from lying down or resting to walking, or change from walking to resting. ? Sit and rest in a tub of warm water. ? Drink enough fluid to keep your urine pale yellow. Dehydration may cause these contractions. ? Do slow and deep breathing several times an hour.  Keep all follow-up prenatal visits as told by your health care provider. This is important. Contact a health care provider if:  You have a fever.  You have continuous pain in your abdomen. Get help right away if:  Your contractions become stronger, more regular, and closer together.  You have fluid leaking or gushing from your vagina.  You pass blood-tinged mucus (bloody show).  You have bleeding from your vagina.  You have low back pain that you never had before.  You feel your baby's head pushing down and causing pelvic pressure.  Your baby is not moving inside you as much as it used to. Summary  Contractions that occur before labor are   called Braxton Hicks contractions, false labor, or practice contractions.  Braxton Hicks contractions are usually shorter, weaker, farther apart, and less regular than true labor contractions. True labor contractions usually become progressively stronger and regular, and they become more frequent.  Manage discomfort from Braxton Hicks contractions  by changing position, resting in a warm bath, drinking plenty of water, or practicing deep breathing. This information is not intended to replace advice given to you by your health care provider. Make sure you discuss any questions you have with your health care provider. Document Revised: 09/23/2017 Document Reviewed: 02/24/2017 Elsevier Patient Education  2020 [employer] Fetal Movement Counts Patient Name: ________________________________________________ Patient Due Date: ____________________ What is a fetal movement count?  A fetal movement count is the number of times that you feel your baby move during a certain amount of time. This may also be called a fetal kick count. A fetal movement count is recommended for every pregnant woman. You may be asked to start counting fetal movements as early as week 28 of your pregnancy. Pay attention to when your baby is most active. You may notice your baby's sleep and wake cycles. You may also notice things that make your baby move more. You should do a fetal movement count:  When your baby is normally most active.  At the same time each day. A good time to count movements is while you are resting, after having something to eat and drink. How do I count fetal movements? 1. Find a quiet, comfortable area. Sit, or lie down on your side. 2. Write down the date, the start time and stop time, and the number of movements that you felt between those two times. Take this information with you to your health care visits. 3. Write down your start time when you feel the first movement. 4. Count kicks, flutters, swishes, rolls, and jabs. You should feel at least 10 movements. 5. You may stop counting after you have felt 10 movements, or if you have been counting for 2 hours. Write down the stop time. 6. If you do not feel 10 movements in 2 hours, contact your health care provider for further instructions. Your health care provider may want to do additional tests  to assess your baby's well-being. Contact a health care provider if:  You feel fewer than 10 movements in 2 hours.  Your baby is not moving like he or she usually does. Date: ____________ Start time: ____________ Stop time: ____________ Movements: ____________ Date: ____________ Start time: ____________ Stop time: ____________ Movements: ____________ Date: ____________ Start time: ____________ Stop time: ____________ Movements: ____________ Date: ____________ Start time: ____________ Stop time: ____________ Movements: ____________ Date: ____________ Start time: ____________ Stop time: ____________ Movements: ____________ Date: ____________ Start time: ____________ Stop time: ____________ Movements: ____________ Date: ____________ Start time: ____________ Stop time: ____________ Movements: ____________ Date: ____________ Start time: ____________ Stop time: ____________ Movements: ____________ Date: ____________ Start time: ____________ Stop time: ____________ Movements: ____________ This information is not intended to replace advice given to you by your health care provider. Make sure you discuss any questions you have with your health care provider. Document Revised: 05/31/2019 Document Reviewed: 05/31/2019 Elsevier Patient Education  2020 [employer]  

## 2019-11-23 NOTE — MAU Provider Note (Signed)
S: [name] is a 24 y.o. G2P1001 at [redacted]w[redacted]d  who presents to MAU today complaining contractions irregularly. She denies vaginal bleeding. She denies LOF. She reports normal fetal movement.    O: BP (!) 115/51 (BP Location: Right Arm)   Pulse 74   Temp 98.1 F (36.7 C) (Oral)   Resp 20   LMP 03/06/2019 (Exact Date)   SpO2 100%  GENERAL: Well-developed, well-nourished female in no acute distress.  HEAD: Normocephalic, atraumatic.  CHEST: Normal effort of breathing, regular heart rate ABDOMEN: Soft, nontender, gravid  Cervical exam: Dilation: 1 Effacement (%): 70 Cervical Position: Posterior Station: -2 Presentation: Vertex Exam by:: F. Morris, RNC    Fetal Monitoring: Baseline: 125 bpm Variability: Moderate  Accelerations: 15x15 Decelerations: None Contractions: Occasional with UI   A: SIUP at [redacted]w[redacted]d  False labor  Reactive NST  P:  Discharge home Labor precautions Discussed fetal kick counts  Duane Lope, NP 11/23/2019 8:57 PM

## 2019-11-23 NOTE — Progress Notes (Signed)
Pt reports has felt FM since arrival to MAU.

## 2019-11-26 ENCOUNTER — Ambulatory Visit (INDEPENDENT_AMBULATORY_CARE_PROVIDER_SITE_OTHER): Payer: Medicaid Other | Admitting: Obstetrics and Gynecology

## 2019-11-26 ENCOUNTER — Encounter: Payer: Self-pay | Admitting: Obstetrics and Gynecology

## 2019-11-26 ENCOUNTER — Other Ambulatory Visit: Payer: Self-pay

## 2019-11-26 VITALS — BP 117/71 | HR 96 | Wt 195.0 lb

## 2019-11-26 DIAGNOSIS — Z3480 Encounter for supervision of other normal pregnancy, unspecified trimester: Secondary | ICD-10-CM

## 2019-11-26 DIAGNOSIS — O43193 Other malformation of placenta, third trimester: Secondary | ICD-10-CM

## 2019-11-26 DIAGNOSIS — O43199 Other malformation of placenta, unspecified trimester: Secondary | ICD-10-CM

## 2019-11-26 DIAGNOSIS — Z3A37 37 weeks gestation of pregnancy: Secondary | ICD-10-CM

## 2019-11-26 DIAGNOSIS — O99820 Streptococcus B carrier state complicating pregnancy: Secondary | ICD-10-CM

## 2019-11-26 NOTE — Progress Notes (Signed)
Subjective:  Andrea Jenkins is a 24 y.o. G2P1001 at [redacted]w[redacted]d being seen today for ongoing prenatal care.  She is currently monitored for the following issues for this high-risk pregnancy and has LGSIL on Pap smear of cervix; Blindness of right eye; Supervision of other normal pregnancy, antepartum; Short interval between pregnancies affecting pregnancy, antepartum; Marginal insertion of umbilical cord affecting management of mother; Fetal echogenic intracardiac focus on prenatal ultrasound; Alpha thalassemia silent carrier; and GBS (group B Streptococcus carrier), +RV culture, currently pregnant on their problem list.  Patient reports general discomforts of pregnancy.  Contractions: Irregular. Vag. Bleeding: None.  Movement: Present. Denies leaking of fluid.   The following portions of the patient's history were reviewed and updated as appropriate: allergies, current medications, past family history, past medical history, past social history, past surgical history and problem list. Problem list updated.  Objective:   Vitals:   11/26/19 1337  BP: 117/71  Pulse: 96  Weight: 195 lb (88.5 kg)    Fetal Status: Fetal Heart Rate (bpm): 140   Movement: Present     General:  Alert, oriented and cooperative. Patient is in no acute distress.  Skin: Skin is warm and dry. No rash noted.   Cardiovascular: Normal heart rate noted  Respiratory: Normal respiratory effort, no problems with respiration noted  Abdomen: Soft, gravid, appropriate for gestational age. Pain/Pressure: Present     Pelvic:  Cervical exam deferred        Extremities: Normal range of motion.  Edema: None  Mental Status: Normal mood and affect. Normal behavior. Normal judgment and thought content.   Urinalysis:      Assessment and Plan:  Pregnancy: G2P1001 at [redacted]w[redacted]d  1. Supervision of other normal pregnancy, antepartum Stable Labor precautions  2. Marginal insertion of umbilical cord affecting management of mother Stable  IOL  39-40 weeks  3. GBS (group B Streptococcus carrier), +RV culture, currently pregnant Tx while in labor  Term labor symptoms and general obstetric precautions including but not limited to vaginal bleeding, contractions, leaking of fluid and fetal movement were reviewed in detail with the patient. Please refer to After Visit Summary for other counseling recommendations.  Return in about 1 week (around 12/03/2019) for OB visit, face to face, MD provider.   Hermina Staggers, MD

## 2019-11-26 NOTE — Patient Instructions (Signed)

## 2019-11-27 ENCOUNTER — Encounter (HOSPITAL_COMMUNITY): Payer: Self-pay | Admitting: *Deleted

## 2019-11-27 ENCOUNTER — Telehealth (HOSPITAL_COMMUNITY): Payer: Self-pay | Admitting: *Deleted

## 2019-11-27 NOTE — Telephone Encounter (Signed)
Preadmission screen  

## 2019-11-28 ENCOUNTER — Encounter (HOSPITAL_COMMUNITY): Payer: Self-pay | Admitting: Family Medicine

## 2019-11-28 ENCOUNTER — Inpatient Hospital Stay (HOSPITAL_COMMUNITY)
Admission: AD | Admit: 2019-11-28 | Discharge: 2019-11-28 | Disposition: A | Discharge status: Home / Self Care | Payer: Medicaid Other | Attending: Family Medicine | Admitting: Family Medicine

## 2019-11-28 ENCOUNTER — Other Ambulatory Visit: Payer: Self-pay | Admitting: Advanced Practice Midwife

## 2019-11-28 ENCOUNTER — Encounter: Payer: Medicaid Other | Admitting: Obstetrics and Gynecology

## 2019-11-28 ENCOUNTER — Other Ambulatory Visit: Payer: Self-pay

## 2019-11-28 DIAGNOSIS — O09899 Supervision of other high risk pregnancies, unspecified trimester: Secondary | ICD-10-CM

## 2019-11-28 DIAGNOSIS — O43123 Velamentous insertion of umbilical cord, third trimester: Secondary | ICD-10-CM | POA: Insufficient documentation

## 2019-11-28 DIAGNOSIS — O99820 Streptococcus B carrier state complicating pregnancy: Secondary | ICD-10-CM

## 2019-11-28 DIAGNOSIS — N898 Other specified noninflammatory disorders of vagina: Secondary | ICD-10-CM

## 2019-11-28 DIAGNOSIS — O26893 Other specified pregnancy related conditions, third trimester: Secondary | ICD-10-CM | POA: Insufficient documentation

## 2019-11-28 DIAGNOSIS — Z3A38 38 weeks gestation of pregnancy: Secondary | ICD-10-CM

## 2019-11-28 DIAGNOSIS — Z87891 Personal history of nicotine dependence: Secondary | ICD-10-CM | POA: Insufficient documentation

## 2019-11-28 DIAGNOSIS — O43199 Other malformation of placenta, unspecified trimester: Secondary | ICD-10-CM

## 2019-11-28 LAB — POCT FERN TEST: POCT Fern Test: NEGATIVE

## 2019-11-28 NOTE — Discharge Instructions (Signed)

## 2019-11-28 NOTE — MAU Note (Signed)
Last night when drying off after shower, had a gush of clear fluid then since then on and off today.  Low abd pressure that is constant.  No bleeding. Baby moving today. Induction planned for this Tues.

## 2019-11-28 NOTE — MAU Provider Note (Signed)
Chief Complaint:  Rupture of Membranes   First Provider Initiated Contact with Patient 11/28/19 2211     HPI: Andrea Jenkins is a 24 y.o. G2P1001 at 56w1dwho presents to maternity admissions reporting gush of fluid after shower.  Underwear were wet but no soaking through clothes. She reports good fetal movement, denies vaginal bleeding, vaginal itching/burning, urinary symptoms, h/a, dizziness, n/v, diarrhea, constipation or fever/chills.  .  Vaginal Discharge The patient's primary symptoms include vaginal discharge. The patient's pertinent negatives include no genital itching, genital lesions, genital odor, pelvic pain or vaginal bleeding. This is a new problem. The current episode started today. The problem occurs rarely. The problem has been resolved. The patient is experiencing no pain. She is pregnant. Pertinent negatives include no abdominal pain, back pain, chills, constipation, diarrhea, fever or frequency. The vaginal discharge was clear and mucoid. There has been no bleeding. She has not been passing clots. She has not been passing tissue. Nothing aggravates the symptoms. She has tried nothing for the symptoms.   RN Note: Last night when drying off after shower, had a gush of clear fluid then since then on and off today.  Low abd pressure that is constant.  No bleeding. Baby moving today. Induction planned for this Tues.  Past Medical History: Past Medical History:  Diagnosis Date  . Asthma    pt states she doesn't use inhaler much (04/10/19)  . Gonorrhea   . Heart murmur    when younger  . Vaginal Pap smear, abnormal     Past obstetric history: OB History  Gravida Para Term Preterm AB Living  2 1 1  0 0 1  SAB TAB Ectopic Multiple Live Births  0 0 0 0 1    # Outcome Date GA Lbr Len/2nd Weight Sex Delivery Anes PTL Lv  2 Current           1 Term 01/18/18 [redacted]w[redacted]d 07:57 / 00:18 3365 g F Vag-Spont EPI  LIV    Past Surgical History: Past Surgical History:  Procedure  Laterality Date  . CATARACT EXTRACTION    . WISDOM TOOTH EXTRACTION    . WRIST SURGERY      Family History: Family History  Problem Relation Age of Onset  . Hypertension Mother   . Miscarriages / [redacted]w[redacted]d Mother   . Hypertension Maternal Grandmother   . Asthma Maternal Grandmother   . Diabetes Maternal Grandmother   . Vision loss Maternal Grandmother   . Mental illness Brother   . Cancer Maternal Aunt   . Mental illness Sister   . Heart disease Neg Hx     Social History: Social History   Tobacco Use  . Smoking status: Former Smoker    Packs/day: 0.25    Types: Cigars    Quit date: 03/11/2017    Years since quitting: 2.7  . Smokeless tobacco: Never Used  Substance Use Topics  . Alcohol use: No    Alcohol/week: 0.0 standard drinks    Comment: mimosa on saturday. 04/06/19  . Drug use: Not Currently    Types: Marijuana    Allergies:  Allergies  Allergen Reactions  . Peach [Prunus Persica] Hives    Meds:  Medications Prior to Admission  Medication Sig Dispense Refill Last Dose  . cyclobenzaprine (FLEXERIL) 5 MG tablet Take 1 tablet (5 mg total) by mouth at bedtime as needed for muscle spasms. (Patient not taking: Reported on 11/08/2019) 15 tablet 0   . Prenat-Fe Poly-Methfol-FA-DHA (VITAFOL ULTRA) 29-0.6-0.4-200 MG CAPS Take  1 tablet by mouth daily. 30 capsule 12     I have reviewed patient's Past Medical Hx, Surgical Hx, Family Hx, Social Hx, medications and allergies.   ROS:  Review of Systems  Constitutional: Negative for chills and fever.  Gastrointestinal: Negative for abdominal pain, constipation and diarrhea.  Genitourinary: Positive for vaginal discharge. Negative for frequency and pelvic pain.  Musculoskeletal: Negative for back pain.   Other systems negative  Physical Exam   Vitals:   11/28/19 2256  BP: (!) 111/53  Pulse: 95  Resp: 16  Temp: 98.1 F (36.7 C)    Constitutional: Well-developed, well-nourished female in no acute distress.   Cardiovascular: normal rate and rhythm Respiratory: normal effort, clear to auscultation bilaterally GI: Abd soft, non-tender, gravid appropriate for gestational age.   No rebound or guarding. MS: Extremities nontender, no edema, normal ROM Neurologic: Alert and oriented x 4.  GU: Neg CVAT.  PELVIC EXAM: Cervix pink, visually closed, without lesion, scant white creamy discharge, vaginal walls and external genitalia normal  Dilation: 1 Effacement (%): 30 Exam by:: Tylar Merendino cnm  FHT:  Baseline 140 , moderate variability, accelerations present, no decelerations Contractions:  Irregular     Labs: Results for orders placed or performed during the hospital encounter of 11/28/19 (from the past 24 hour(s))  POCT fern test     Status: None   Collection Time: 11/28/19 10:38 PM  Result Value Ref Range   POCT Fern Test Negative = intact amniotic membranes     A/Positive/-- (08/05 1349)  Imaging:    MAU Course/MDM: NST reviewed and is reactive  Treatments in MAU included EFM Reviewed findings do not indicate spontaneous rupture of membranes Labor precautions reviewed by RN.    Assessment: 1. GBS (group B Streptococcus carrier), +RV culture, currently pregnant   2. Marginal insertion of umbilical cord affecting management of mother   3. Short interval between pregnancies affecting pregnancy, antepartum     Plan: Discharge home Labor precautions and fetal kick counts Follow up in Office for prenatal visits   Encouraged to return here if she develops worsening of symptoms, increase in pain, fever, or other concerning symptoms.   Pt stable at time of discharge.  Hansel Feinstein CNM, MSN Certified Nurse-Midwife 11/28/2019 10:12 PM

## 2019-11-29 ENCOUNTER — Other Ambulatory Visit (HOSPITAL_COMMUNITY): Payer: Self-pay | Admitting: Advanced Practice Midwife

## 2019-11-29 ENCOUNTER — Inpatient Hospital Stay (HOSPITAL_COMMUNITY)
Admission: AD | Admit: 2019-11-29 | Discharge: 2019-11-29 | Disposition: A | Discharge status: Home / Self Care | Payer: Medicaid Other | Attending: Family Medicine | Admitting: Family Medicine

## 2019-11-29 ENCOUNTER — Telehealth: Payer: Self-pay

## 2019-11-29 ENCOUNTER — Encounter (HOSPITAL_COMMUNITY): Payer: Self-pay | Admitting: Family Medicine

## 2019-11-29 DIAGNOSIS — O471 False labor at or after 37 completed weeks of gestation: Secondary | ICD-10-CM | POA: Diagnosis not present

## 2019-11-29 DIAGNOSIS — Z3A38 38 weeks gestation of pregnancy: Secondary | ICD-10-CM

## 2019-11-29 DIAGNOSIS — J45909 Unspecified asthma, uncomplicated: Secondary | ICD-10-CM | POA: Diagnosis not present

## 2019-11-29 DIAGNOSIS — Z87891 Personal history of nicotine dependence: Secondary | ICD-10-CM | POA: Insufficient documentation

## 2019-11-29 DIAGNOSIS — O26893 Other specified pregnancy related conditions, third trimester: Secondary | ICD-10-CM | POA: Diagnosis not present

## 2019-11-29 DIAGNOSIS — Z79899 Other long term (current) drug therapy: Secondary | ICD-10-CM | POA: Insufficient documentation

## 2019-11-29 DIAGNOSIS — N898 Other specified noninflammatory disorders of vagina: Secondary | ICD-10-CM | POA: Diagnosis not present

## 2019-11-29 DIAGNOSIS — O99513 Diseases of the respiratory system complicating pregnancy, third trimester: Secondary | ICD-10-CM | POA: Insufficient documentation

## 2019-11-29 DIAGNOSIS — O479 False labor, unspecified: Secondary | ICD-10-CM

## 2019-11-29 DIAGNOSIS — H538 Other visual disturbances: Secondary | ICD-10-CM

## 2019-11-29 LAB — COMPREHENSIVE METABOLIC PANEL
ALT: 10 U/L (ref 0–44)
AST: 18 U/L (ref 15–41)
Albumin: 2.9 g/dL — ABNORMAL LOW (ref 3.5–5.0)
Alkaline Phosphatase: 84 U/L (ref 38–126)
Anion gap: 8 (ref 5–15)
BUN: <5 mg/dL — ABNORMAL LOW (ref 6–20)
CO2: 21 mmol/L — ABNORMAL LOW (ref 22–32)
Calcium: 8.9 mg/dL (ref 8.9–10.3)
Chloride: 109 mmol/L (ref 98–111)
Creatinine, Ser: 0.55 mg/dL (ref 0.44–1.00)
GFR calc Af Amer: >60 mL/min (ref >60)
GFR calc non Af Amer: >60 mL/min (ref >60)
Glucose, Bld: 72 mg/dL (ref 70–99)
Potassium: 4 mmol/L (ref 3.5–5.1)
Sodium: 138 mmol/L (ref 135–145)
Total Bilirubin: 0.5 mg/dL (ref 0.3–1.2)
Total Protein: 6.2 g/dL — ABNORMAL LOW (ref 6.5–8.1)

## 2019-11-29 LAB — AMNISURE RUPTURE OF MEMBRANE (ROM) NOT AT ARMC: Amnisure ROM: NEGATIVE

## 2019-11-29 LAB — URINALYSIS, ROUTINE W REFLEX MICROSCOPIC
Bilirubin Urine: NEGATIVE
Glucose, UA: NEGATIVE mg/dL
Hgb urine dipstick: NEGATIVE
Ketones, ur: NEGATIVE mg/dL
Leukocytes,Ua: NEGATIVE
Nitrite: NEGATIVE
Protein, ur: NEGATIVE mg/dL
Specific Gravity, Urine: 1.009 (ref 1.005–1.030)
pH: 7 (ref 5.0–8.0)

## 2019-11-29 LAB — CBC
HCT: 30.1 % — ABNORMAL LOW (ref 36.0–46.0)
Hemoglobin: 9.7 g/dL — ABNORMAL LOW (ref 12.0–15.0)
MCH: 27.3 pg (ref 26.0–34.0)
MCHC: 32.2 g/dL (ref 30.0–36.0)
MCV: 84.8 fL (ref 80.0–100.0)
Platelets: 156 10*3/uL (ref 150–400)
RBC: 3.55 MIL/uL — ABNORMAL LOW (ref 3.87–5.11)
RDW: 13.8 % (ref 11.5–15.5)
WBC: 6.6 10*3/uL (ref 4.0–10.5)
nRBC: 0 % (ref 0.0–0.2)

## 2019-11-29 NOTE — Telephone Encounter (Signed)
Pt called and LVM on nurse line stating that she went to the hospital yesterday for leaking of fluid, they sent her home and said it wasn't her water breaking. Pt reports that she is still having "leaking" and reports a lot of pressure and pain. Called pt back to further evaluate, no answer. LVM for pt advising her to go back to the hospital if she thinks she may be in labor, if she thinks water is broken, or for decreased fetal movement. Advised pt to call back with any further questions.

## 2019-11-29 NOTE — MAU Note (Signed)
States she was evaluated yesterday for R/O ROM then had 2 more gushes today.  States she is also feeling vaginal pressure and HA.  Also states she has only felt 3 fetal movements today-last at 1700.  No VB.  CTX every 5-10 minutes.

## 2019-11-29 NOTE — MAU Provider Note (Signed)
Chief Complaint:  Rupture of Membranes   First Provider Initiated Contact with Patient 11/29/19 2204     HPI: Andrea Jenkins is a 24 y.o. G2P1001 at 19w2dwho presents to maternity admissions reporting contractions, vaginal discharge (called gushes), headache and blurred vision.  Seen initially by N Nugent NP but no note started.  She reports good fetal movement, denies vaginal bleeding, vaginal itching/burning, urinary symptoms, h/a, dizziness, n/v, diarrhea, constipation or fever/chills.  She denies RUQ abdominal pain.  Vaginal Discharge The patient's primary symptoms include pelvic pain and vaginal discharge. The patient's pertinent negatives include no genital itching, genital lesions, genital odor or vaginal bleeding. This is a recurrent problem. The current episode started today. The problem occurs intermittently. The pain is moderate. She is pregnant. Associated symptoms include abdominal pain, back pain and headaches. Pertinent negatives include no chills, constipation, diarrhea, dysuria, fever, nausea or vomiting. The vaginal discharge was white and milky. There has been no bleeding. She has not been passing clots. She has not been passing tissue. Nothing aggravates the symptoms.    RN Note: States she was evaluated yesterday for R/O ROM then had 2 more gushes today.  States she is also feeling vaginal pressure and HA.  Also states she has only felt 3 fetal movements today-last at 1700.  No VB.  CTX every 5-10 minutes.    Past Medical History: Past Medical History:  Diagnosis Date  . Asthma    pt states she doesn't use inhaler much (04/10/19)  . Gonorrhea   . Heart murmur    when younger  . Vaginal Pap smear, abnormal     Past obstetric history: OB History  Gravida Para Term Preterm AB Living  2 1 1  0 0 1  SAB TAB Ectopic Multiple Live Births  0 0 0 0 1    # Outcome Date GA Lbr Len/2nd Weight Sex Delivery Anes PTL Lv  2 Current           1 Term 01/18/18 [redacted]w[redacted]d 07:57 / 00:18  3365 g F Vag-Spont EPI  LIV    Past Surgical History: Past Surgical History:  Procedure Laterality Date  . CATARACT EXTRACTION    . WISDOM TOOTH EXTRACTION    . WRIST SURGERY      Family History: Family History  Problem Relation Age of Onset  . Hypertension Mother   . Miscarriages / [redacted]w[redacted]d Mother   . Hypertension Maternal Grandmother   . Asthma Maternal Grandmother   . Diabetes Maternal Grandmother   . Vision loss Maternal Grandmother   . Mental illness Brother   . Cancer Maternal Aunt   . Mental illness Sister   . Heart disease Neg Hx     Social History: Social History   Tobacco Use  . Smoking status: Former Smoker    Packs/day: 0.25    Types: Cigars    Quit date: 03/11/2017    Years since quitting: 2.7  . Smokeless tobacco: Never Used  Substance Use Topics  . Alcohol use: No    Alcohol/week: 0.0 standard drinks    Comment: mimosa on saturday. 04/06/19  . Drug use: Not Currently    Types: Marijuana    Allergies:  Allergies  Allergen Reactions  . Peach [Prunus Persica] Hives    Meds:  Medications Prior to Admission  Medication Sig Dispense Refill Last Dose  . acetaminophen (TYLENOL) 500 MG tablet Take 500 mg by mouth every 6 (six) hours as needed.   11/29/2019 at Unknown time  . cyclobenzaprine (  FLEXERIL) 5 MG tablet Take 1 tablet (5 mg total) by mouth at bedtime as needed for muscle spasms. (Patient not taking: Reported on 11/08/2019) 15 tablet 0   . Prenat-Fe Poly-Methfol-FA-DHA (VITAFOL ULTRA) 29-0.6-0.4-200 MG CAPS Take 1 tablet by mouth daily. 30 capsule 12     I have reviewed patient's Past Medical Hx, Surgical Hx, Family Hx, Social Hx, medications and allergies.   ROS:  Review of Systems  Constitutional: Negative for chills and fever.  Gastrointestinal: Positive for abdominal pain. Negative for constipation, diarrhea, nausea and vomiting.  Genitourinary: Positive for pelvic pain and vaginal discharge. Negative for dysuria.  Musculoskeletal:  Positive for back pain.  Neurological: Positive for headaches.   Other systems negative  Physical Exam   Patient Vitals for the past 24 hrs:  BP Temp Pulse Resp SpO2 Height Weight  11/29/19 2145 118/72 -- (!) 101 -- 100 % -- --  11/29/19 2131 107/63 -- 88 -- -- -- --  11/29/19 2115 125/75 -- 93 -- -- -- --  11/29/19 2104 115/73 -- 95 -- -- -- --  11/29/19 2013 118/67 98.6 F (37 C) 100 17 -- 5\' 3"  (1.6 m) 89.4 kg   Vitals:   11/29/19 2115 11/29/19 2131 11/29/19 2145 11/29/19 2348  BP: 125/75 107/63 118/72 (!) 119/53  Pulse: 93 88 (!) 101 91  Resp:      Temp:      SpO2:   100%   Weight:      Height:        Constitutional: Well-developed, well-nourished female in no acute distress.  Cardiovascular: normal rate and rhythm Respiratory: normal effort, clear to auscultation bilaterally GI: Abd soft, non-tender, gravid appropriate for gestational age.   No rebound or guarding. MS: Extremities nontender, no edema, normal ROM Neurologic: Alert and oriented x 4.  GU: Neg CVAT.  PELVIC EXAM: Exam done by N Nugent NP   Reported as negative for ruptured membranes Dilation: 1 Effacement (%): 30 Cervical Position: Posterior Station: -3 Presentation: Vertex Exam by:: [email or web address], RN  FHT:  Baseline 140 , moderate variability, accelerations present, no decelerations Contractions:  Irregular     Labs: A/Positive/-- (08/05 1349) Results for orders placed or performed during the hospital encounter of 11/29/19 (from the past 24 hour(s))  Amnisure rupture of membrane (rom)not at [facility]     Status: None   Collection Time: 11/29/19  8:57 PM  Result Value Ref Range   Amnisure ROM NEGATIVE   CBC     Status: Abnormal   Collection Time: 11/29/19  9:20 PM  Result Value Ref Range   WBC 6.6 4.0 - 10.5 K/uL   RBC 3.55 (L) 3.87 - 5.11 MIL/uL   Hemoglobin 9.7 (L) 12.0 - 15.0 g/dL   HCT 01/27/20 (L) 20.2 - 54.2 %   MCV 84.8 80.0 - 100.0 fL   MCH 27.3 26.0 - 34.0 pg   MCHC 32.2 30.0 - 36.0  g/dL   RDW 70.6 23.7 - 62.8 %   Platelets 156 150 - 400 K/uL   nRBC 0.0 0.0 - 0.2 %  Comprehensive metabolic panel     Status: Abnormal   Collection Time: 11/29/19  9:20 PM  Result Value Ref Range   Sodium 138 135 - 145 mmol/L   Potassium 4.0 3.5 - 5.1 mmol/L   Chloride 109 98 - 111 mmol/L   CO2 21 (L) 22 - 32 mmol/L   Glucose, Bld 72 70 - 99 mg/dL   BUN <5 (L) 6 - 20  mg/dL   Creatinine, Ser 0.55 0.44 - 1.00 mg/dL   Calcium 8.9 8.9 - 10.3 mg/dL   Total Protein 6.2 (L) 6.5 - 8.1 g/dL   Albumin 2.9 (L) 3.5 - 5.0 g/dL   AST 18 15 - 41 U/L   ALT 10 0 - 44 U/L   Alkaline Phosphatase 84 38 - 126 U/L   Total Bilirubin 0.5 0.3 - 1.2 mg/dL   GFR calc non Af Amer >60 >60 mL/min   GFR calc Af Amer >60 >60 mL/min   Anion gap 8 5 - 15  Urinalysis, Routine w reflex microscopic     Status: Abnormal   Collection Time: 11/29/19 10:01 PM  Result Value Ref Range   Color, Urine YELLOW YELLOW   APPearance HAZY (A) CLEAR   Specific Gravity, Urine 1.009 1.005 - 1.030   pH 7.0 5.0 - 8.0   Glucose, UA NEGATIVE NEGATIVE mg/dL   Hgb urine dipstick NEGATIVE NEGATIVE   Bilirubin Urine NEGATIVE NEGATIVE   Ketones, ur NEGATIVE NEGATIVE mg/dL   Protein, ur NEGATIVE NEGATIVE mg/dL   Nitrite NEGATIVE NEGATIVE   Leukocytes,Ua NEGATIVE NEGATIVE  Protein / creatinine ratio, urine     Status: None   Collection Time: 11/29/19 10:03 PM  Result Value Ref Range   Creatinine, Urine 87.81 mg/dL   Total Protein, Urine 8 mg/dL   Protein Creatinine Ratio 0.09 0.00 - 0.15 mg/mg[Cre]    Imaging:    MAU Course/MDM: Previous provider ordered labs for preeclampsia due to report of H/A and blurred vision, even though there have been no elevations in BP.  I reviewed results, which are all normal  All BPs were WNL Exam was negative for ruptured membreanes.  NST reviewed, reactive with one variable decel to 90s Consult Dr Kennon Rounds with presentation, exam findings and test results.   She recommends EFM for 66min more.  If  no more decels, may discharge home. We monitored for 45 min with reassuring tracing and no decels Reviewed process for IOL with patient She is scheduled for Tuesday for IOL  Assessment: Single IUP at [redacted]w[redacted]d Vaginal discharge Headache and blurred vision, improved,  Normal labs and normotensive Irregular contractions, not in labor  Plan: Discharge home Labor precautions and fetal kick counts Follow up in Office for prenatal visits and hospital Tues for IOL  Encouraged to return here or to other Urgent Care/ED if she develops worsening of symptoms, increase in pain, fever, or other concerning symptoms.   Pt stable at time of discharge.  Hansel Feinstein CNM, MSN Certified Nurse-Midwife 11/29/2019 10:34 PM

## 2019-11-29 NOTE — Discharge Instructions (Signed)
Blurred Vision, Adult        Having blurred vision means that you cannot see things clearly. Your vision may seem fuzzy or out of focus. It can involve your vision for objects that are close or far away. It may affect one or both eyes. There are many causes of blurred vision, including cataracts, macular degeneration, eye inflammation (uveitis), and diabetic retinopathy. In many cases, blurred vision has to do with the shape of your eye. An abnormal eye shape means you cannot focus well (refractive error). When this happens, it can cause:  Faraway objects to look blurry (nearsightedness).  Close objects to look blurry (farsightedness).  Blurry vision at any distance (astigmatism). Refractive errors are often corrected with glasses or contacts. Blurred vision can be diagnosed based on your symptoms and a physical exam. Tell your health care provider about any other health problems you have, any recent eye injury, and any prior surgeries. You may need to see a health care provider who specializes in eye problems (ophthalmologist). Your treatment will depend on what is causing your blurred vision. Follow these instructions at home:  Keep all follow-up visits as told by your health care provider. This is important. These include any visits to your eye specialists.  Do not drive or use heavy machinery if your vision is blurry.  Use eye drops only as told by your health care provider.  If you were prescribed glasses or contact lenses, wear the glasses or contacts as told by your health care provider.  Schedule eye exams regularly.  Pay attention to any changes in your symptoms. Contact a health care provider if:  Your symptoms do not improve or they get worse.  You have: ? New symptoms. ? A headache. ? Trouble seeing at night. ? Trouble noticing the difference between colors.  You notice: ? Drooping of your eyelids. ? Drainage coming from your eyes. ? A rash around your eyes. Get  help right away if:  You have: ? Severe eye pain. ? A severe headache. ? A sudden change in vision. ? A sudden loss of vision. ? A vision change after an injury.  You notice flashing lights in your field of vision. Your field of vision is the area that you can see without moving your eyes. Summary  Having blurred vision means that you cannot see things clearly. Your vision may seem fuzzy or out of focus.  There are many causes of blurred vision. In many cases, blurred vision has to do with an abnormal eye shape (refractive error), and it can be corrected with glasses or contact lenses.  Pay attention to any changes in your symptoms. Contact a health care provider if your symptoms do not improve or if you have any new symptoms. This information is not intended to replace advice given to you by your health care provider. Make sure you discuss any questions you have with your health care provider. Document Revised: 01/05/2018 Document Reviewed: 01/28/2017 Elsevier Patient Education  2020 [employer]  Vaginal Delivery  Vaginal delivery means that you give birth by pushing your baby out of your birth canal (vagina). A team of health care providers will help you before, during, and after vaginal delivery. Birth experiences are unique for every woman and every pregnancy, and birth experiences vary depending on where you choose to give birth. What happens when I arrive at the birth center or hospital? Once you are in labor and have been admitted into the hospital or birth center, your  health care provider may:  Review your pregnancy history and any concerns that you have.  Insert an IV into one of your veins. This may be used to give you fluids and medicines.  Check your blood pressure, pulse, temperature, and heart rate (vital signs).  Check whether your bag of water (amniotic sac) has broken (ruptured).  Talk with you about your birth plan and discuss pain control  options. Monitoring Your health care provider may monitor your contractions (uterine monitoring) and your baby's heart rate (fetal monitoring). You may need to be monitored:  Often, but not continuously (intermittently).  All the time or for long periods at a time (continuously). Continuous monitoring may be needed if: ? You are taking certain medicines, such as medicine to relieve pain or make your contractions stronger. ? You have pregnancy or labor complications. Monitoring may be done by:  Placing a special stethoscope or a handheld monitoring device on your abdomen to check your baby's heartbeat and to check for contractions.  Placing monitors on your abdomen (external monitors) to record your baby's heartbeat and the frequency and length of contractions.  Placing monitors inside your uterus through your vagina (internal monitors) to record your baby's heartbeat and the frequency, length, and strength of your contractions. Depending on the type of monitor, it may remain in your uterus or on your baby's head until birth.  Telemetry. This is a type of continuous monitoring that can be done with external or internal monitors. Instead of having to stay in bed, you are able to move around during telemetry. Physical exam Your health care provider may perform frequent physical exams. This may include:  Checking how and where your baby is positioned in your uterus.  Checking your cervix to determine: ? Whether it is thinning out (effacing). ? Whether it is opening up (dilating). What happens during labor and delivery?  Normal labor and delivery is divided into the following three stages: Stage 1  This is the longest stage of labor.  This stage can last for hours or days.  Throughout this stage, you will feel contractions. Contractions generally feel mild, infrequent, and irregular at first. They get stronger, more frequent (about every 2-3 minutes), and more regular as you move  through this stage.  This stage ends when your cervix is completely dilated to 4 inches (10 cm) and completely effaced. Stage 2  This stage starts once your cervix is completely effaced and dilated and lasts until the delivery of your baby.  This stage may last from 20 minutes to 2 hours.  This is the stage where you will feel an urge to push your baby out of your vagina.  You may feel stretching and burning pain, especially when the widest part of your baby's head passes through the vaginal opening (crowning).  Once your baby is delivered, the umbilical cord will be clamped and cut. This usually occurs after waiting a period of 1-2 minutes after delivery.  Your baby will be placed on your bare chest (skin-to-skin contact) in an upright position and covered with a warm blanket. Watch your baby for feeding cues, like rooting or sucking, and help the baby to your breast for his or her first feeding. Stage 3  This stage starts immediately after the birth of your baby and ends after you deliver the placenta.  This stage may take anywhere from 5 to 30 minutes.  After your baby has been delivered, you will feel contractions as your body expels the placenta  and your uterus contracts to control bleeding. What can I expect after labor and delivery?  After labor is over, you and your baby will be monitored closely until you are ready to go home to ensure that you are both healthy. Your health care team will teach you how to care for yourself and your baby.  You and your baby will stay in the same room (rooming in) during your hospital stay. This will encourage early bonding and successful breastfeeding.  You may continue to receive fluids and medicines through an IV.  Your uterus will be checked and massaged regularly (fundal massage).  You will have some soreness and pain in your abdomen, vagina, and the area of skin between your vaginal opening and your anus (perineum).  If an incision was  made near your vagina (episiotomy) or if you had some vaginal tearing during delivery, cold compresses may be placed on your episiotomy or your tear. This helps to reduce pain and swelling.  You may be given a squirt bottle to use instead of wiping when you go to the bathroom. To use the squirt bottle, follow these steps: ? Before you urinate, fill the squirt bottle with warm water. Do not use hot water. ? After you urinate, while you are sitting on the toilet, use the squirt bottle to rinse the area around your urethra and vaginal opening. This rinses away any urine and blood. ? Fill the squirt bottle with clean water every time you use the bathroom.  It is normal to have vaginal bleeding after delivery. Wear a sanitary pad for vaginal bleeding and discharge. Summary  Vaginal delivery means that you will give birth by pushing your baby out of your birth canal (vagina).  Your health care provider may monitor your contractions (uterine monitoring) and your baby's heart rate (fetal monitoring).  Your health care provider may perform a physical exam.  Normal labor and delivery is divided into three stages.  After labor is over, you and your baby will be monitored closely until you are ready to go home. This information is not intended to replace advice given to you by your health care provider. Make sure you discuss any questions you have with your health care provider. Document Revised: 11/15/2017 Document Reviewed: 11/15/2017 Elsevier Patient Education  2020 [employer].

## 2019-11-30 ENCOUNTER — Inpatient Hospital Stay (HOSPITAL_COMMUNITY)
Admission: AD | Admit: 2019-11-30 | Discharge: 2019-11-30 | Disposition: A | Discharge status: Home / Self Care | Payer: Medicaid Other | Attending: Obstetrics & Gynecology | Admitting: Obstetrics & Gynecology

## 2019-11-30 ENCOUNTER — Inpatient Hospital Stay (HOSPITAL_BASED_OUTPATIENT_CLINIC_OR_DEPARTMENT_OTHER): Payer: Medicaid Other

## 2019-11-30 ENCOUNTER — Other Ambulatory Visit: Payer: Self-pay

## 2019-11-30 ENCOUNTER — Ambulatory Visit (INDEPENDENT_AMBULATORY_CARE_PROVIDER_SITE_OTHER): Payer: Medicaid Other | Admitting: Nurse Practitioner

## 2019-11-30 ENCOUNTER — Encounter (HOSPITAL_COMMUNITY): Payer: Self-pay | Admitting: Obstetrics & Gynecology

## 2019-11-30 VITALS — BP 111/72 | HR 85

## 2019-11-30 DIAGNOSIS — O26893 Other specified pregnancy related conditions, third trimester: Secondary | ICD-10-CM | POA: Diagnosis not present

## 2019-11-30 DIAGNOSIS — Z3A38 38 weeks gestation of pregnancy: Secondary | ICD-10-CM

## 2019-11-30 DIAGNOSIS — O289 Unspecified abnormal findings on antenatal screening of mother: Secondary | ICD-10-CM | POA: Diagnosis not present

## 2019-11-30 DIAGNOSIS — O368130 Decreased fetal movements, third trimester, not applicable or unspecified: Secondary | ICD-10-CM | POA: Insufficient documentation

## 2019-11-30 DIAGNOSIS — O99513 Diseases of the respiratory system complicating pregnancy, third trimester: Secondary | ICD-10-CM | POA: Insufficient documentation

## 2019-11-30 DIAGNOSIS — O09899 Supervision of other high risk pregnancies, unspecified trimester: Secondary | ICD-10-CM

## 2019-11-30 DIAGNOSIS — O288 Other abnormal findings on antenatal screening of mother: Secondary | ICD-10-CM | POA: Diagnosis not present

## 2019-11-30 DIAGNOSIS — J45909 Unspecified asthma, uncomplicated: Secondary | ICD-10-CM | POA: Insufficient documentation

## 2019-11-30 DIAGNOSIS — Z79899 Other long term (current) drug therapy: Secondary | ICD-10-CM | POA: Insufficient documentation

## 2019-11-30 DIAGNOSIS — Z3480 Encounter for supervision of other normal pregnancy, unspecified trimester: Secondary | ICD-10-CM

## 2019-11-30 DIAGNOSIS — O43199 Other malformation of placenta, unspecified trimester: Secondary | ICD-10-CM

## 2019-11-30 DIAGNOSIS — Z3689 Encounter for other specified antenatal screening: Secondary | ICD-10-CM

## 2019-11-30 DIAGNOSIS — R519 Headache, unspecified: Secondary | ICD-10-CM | POA: Insufficient documentation

## 2019-11-30 DIAGNOSIS — N898 Other specified noninflammatory disorders of vagina: Secondary | ICD-10-CM | POA: Diagnosis not present

## 2019-11-30 DIAGNOSIS — Z87891 Personal history of nicotine dependence: Secondary | ICD-10-CM | POA: Insufficient documentation

## 2019-11-30 DIAGNOSIS — O368190 Decreased fetal movements, unspecified trimester, not applicable or unspecified: Secondary | ICD-10-CM

## 2019-11-30 DIAGNOSIS — O43193 Other malformation of placenta, third trimester: Secondary | ICD-10-CM

## 2019-11-30 DIAGNOSIS — O99820 Streptococcus B carrier state complicating pregnancy: Secondary | ICD-10-CM

## 2019-11-30 DIAGNOSIS — R102 Pelvic and perineal pain: Secondary | ICD-10-CM | POA: Diagnosis not present

## 2019-11-30 LAB — PROTEIN / CREATININE RATIO, URINE
Creatinine, Urine: 87.81 mg/dL
Protein Creatinine Ratio: 0.09 mg/mg{Cre} (ref 0.00–0.15)
Total Protein, Urine: 8 mg/dL

## 2019-11-30 MED ORDER — ACETAMINOPHEN 500 MG PO TABS
1000.0000 mg | ORAL_TABLET | Freq: Once | ORAL | Status: AC
  Administered 2019-11-30: 15:00:00 1000 mg via ORAL
  Filled 2019-11-30: qty 2

## 2019-11-30 NOTE — Progress Notes (Signed)
    Subjective:  Andrea Jenkins is a 24 y.o. G2P1001 at [redacted]w[redacted]d being seen today for ongoing prenatal care.  She is currently monitored for the following issues for this high-risk pregnancy and has LGSIL on Pap smear of cervix; Blindness of right eye; Supervision of other normal pregnancy, antepartum; Short interval between pregnancies affecting pregnancy, antepartum; Marginal insertion of umbilical cord affecting management of mother; Fetal echogenic intracardiac focus on prenatal ultrasound; Alpha thalassemia silent carrier; and GBS (group B Streptococcus carrier), +RV culture, currently pregnant on their problem list.  Patient reports no fetal movement.  Contractions: Irregular. Vag. Bleeding: None.  Movement: (!) Decreased. Denies leaking of fluid.   The following portions of the patient's history were reviewed and updated as appropriate: allergies, current medications, past family history, past medical history, past social history, past surgical history and problem list. Problem list updated.  Objective:   Vitals:   11/30/19 1119  BP: 111/72  Pulse: 85    Fetal Status: Fetal Heart Rate (bpm): 135   Movement: (!) Decreased     General:  Alert, oriented and cooperative. Patient is in no acute distress.  Skin: Skin is warm and dry. No rash noted.   Cardiovascular: Normal heart rate noted  Respiratory: Normal respiratory effort, no problems with respiration noted  Abdomen: Soft, gravid, appropriate for gestational age. Pain/Pressure: Present     Pelvic:  Cervical exam deferred        Extremities: Normal range of motion.  Edema: None  Mental Status: Normal mood and affect. Normal behavior. Normal judgment and thought content.   Urinalysis:      Assessment and Plan:  Pregnancy: G2P1001 at [redacted]w[redacted]d  1. Decreased fetal movements in third trimester, single or unspecified fetus Nonreactive NST - FHT baseline 135 with moderate variability but no accelerations.  Having uterine irritability  and occasional contractions,  2-3 in 20 minutes, No decelerations, Is not feeling fetal movement and has not been feeling fetal movement for several days - evaluated in MAU on 11-29-19.  Client states she did eat breakfast.  Advised to go to MAU for further evaluation.  Client expressed she does not have anyone to keep her child.  Encouraged to find someone to keep her child - child is not present with her at this time but the caregiver for her child needs to go to work.  - Fetal nonstress test  2. Supervision of other normal pregnancy, antepartum Report called to MAU to expect client to come for further evaluation as she had a  Nonreactive NST in the office and needs BPP done.  Term labor symptoms and general obstetric precautions including but not limited to vaginal bleeding, contractions, leaking of fluid and fetal movement were reviewed in detail with the patient. Please refer to After Visit Summary for other counseling recommendations.  Return in about 3 days (around 12/03/2019) for Has appointment for induction on 12-04-19.  Nolene Bernheim, RN, MSN, NP-BC Nurse Practitioner, [facility] for [employer], [facility] 11/30/2019 12:03 PM

## 2019-11-30 NOTE — MAU Note (Signed)
Had gone to the dr, they did a non-stress test.  York Spaniel the babies heart rate wasn't going up like it was last night when she was here.. was here last night for decreased fetal movement.  Still not moving well. Denies bleeding or leaking.  Having constant pelvic pressure.

## 2019-11-30 NOTE — Progress Notes (Signed)
Pt presents for decreased fetal movement. Pt was seen at MAU yesterday for the same concern along with possible LOF. Pt expresses that she does not want to keep feeling like this and would like to be induced sooner than 2/9 which is when she is scheduled for induction.

## 2019-11-30 NOTE — Discharge Instructions (Signed)

## 2019-11-30 NOTE — MAU Provider Note (Signed)
History     CSN: [id number]  Arrival date and time: 11/30/19 1227   None     Chief Complaint  Patient presents with  . Decreased Fetal Movement  . failed NST  . Pelvic Pain   Andrea Jenkins is a 24 y.o. G2P1001 at [redacted]w[redacted]d who receives care at [facility].  She presents today for Decreased Fetal Movement and failed NST.  Patient reports that she has been feeling movement, but it is not as frequent as before.   She states yesterday she felt movement about 3 times and has felt movement about 4x today.  She endorses movement upon arrival to the MAU. She endorses contractions that are every 5-10 minutes and started "weeks ago."  Patient reports leaking of fluid the past 3 days, but was told in the office it was not amniotic fluid.  Patient reports she last ate at 1045 and had salad with chicken on top, bowl of ice cream, and a bowl of cereal.  Patient also reports a headache that is a 5/10.    OB History    Gravida  2   Para  1   Term  1   Preterm  0   AB  0   Living  1     SAB  0   TAB  0   Ectopic  0   Multiple  0   Live Births  1           Past Medical History:  Diagnosis Date  . Asthma    pt states she doesn't use inhaler much (04/10/19)  . Gonorrhea   . Heart murmur    when younger  . Vaginal Pap smear, abnormal     Past Surgical History:  Procedure Laterality Date  . CATARACT EXTRACTION    . WISDOM TOOTH EXTRACTION    . WRIST SURGERY      Family History  Problem Relation Age of Onset  . Hypertension Mother   . Miscarriages / [place] Mother   . Hypertension Maternal Grandmother   . Asthma Maternal Grandmother   . Diabetes Maternal Grandmother   . Vision loss Maternal Grandmother   . Mental illness Brother   . Cancer Maternal Aunt   . Mental illness Sister   . Heart disease Neg Hx     Social History   Tobacco Use  . Smoking status: Former Smoker    Packs/day: 0.25    Types: Cigars    Quit date: 03/11/2017    Years since quitting:  2.7  . Smokeless tobacco: Never Used  Substance Use Topics  . Alcohol use: No    Alcohol/week: 0.0 standard drinks    Comment: mimosa on saturday. 04/06/19  . Drug use: Not Currently    Types: Marijuana    Allergies:  Allergies  Allergen Reactions  . Peach [Prunus Persica] Hives    Medications Prior to Admission  Medication Sig Dispense Refill Last Dose  . acetaminophen (TYLENOL) 500 MG tablet Take 500 mg by mouth every 6 (six) hours as needed.     . cyclobenzaprine (FLEXERIL) 5 MG tablet Take 1 tablet (5 mg total) by mouth at bedtime as needed for muscle spasms. 15 tablet 0   . Prenat-Fe Poly-Methfol-FA-DHA (VITAFOL ULTRA) 29-0.6-0.4-200 MG CAPS Take 1 tablet by mouth daily. 30 capsule 12     Review of Systems  Constitutional: Negative for chills and fever.  Respiratory: Negative for cough and shortness of breath.   Gastrointestinal: Positive for abdominal pain (Cramping),  constipation (Yesterday, hard to pass), nausea and vomiting. Negative for diarrhea.  Genitourinary: Negative for difficulty urinating, dysuria, vaginal bleeding and vaginal discharge.  Neurological: Positive for headaches (5/10). Negative for dizziness and light-headedness.   Physical Exam   Blood pressure 106/72, pulse 73, temperature 99 F (37.2 C), temperature source Oral, resp. rate 16, height 5\' 3"  (1.6 m), weight 88.3 kg, last menstrual period 03/06/2019, SpO2 100 %, not currently breastfeeding.  Physical Exam  Constitutional: She is oriented to person, place, and time. She appears well-developed and well-nourished. No distress.  HENT:  Head: Normocephalic and atraumatic.  Eyes: Conjunctivae are normal.  Cardiovascular: Normal rate.  Respiratory: Effort normal.  Musculoskeletal:        General: Normal range of motion.     Cervical back: Normal range of motion.  Neurological: She is alert and oriented to person, place, and time.  Skin: Skin is warm and dry.  Psychiatric: She has a normal mood and  affect. Her behavior is normal.    Fetal Assessment 135 bpm, Mod Var, -Decels, +Accels Toco: Irregular  MAU Course   Results for orders placed or performed during the hospital encounter of 11/29/19 (from the past 24 hour(s))  Amnisure rupture of membrane (rom)not at [facility]     Status: None   Collection Time: 11/29/19  8:57 PM  Result Value Ref Range   Amnisure ROM NEGATIVE   CBC     Status: Abnormal   Collection Time: 11/29/19  9:20 PM  Result Value Ref Range   WBC 6.6 4.0 - 10.5 K/uL   RBC 3.55 (L) 3.87 - 5.11 MIL/uL   Hemoglobin 9.7 (L) 12.0 - 15.0 g/dL   HCT 30.1 (L) 36.0 - 46.0 %   MCV 84.8 80.0 - 100.0 fL   MCH 27.3 26.0 - 34.0 pg   MCHC 32.2 30.0 - 36.0 g/dL   RDW 13.8 11.5 - 15.5 %   Platelets 156 150 - 400 K/uL   nRBC 0.0 0.0 - 0.2 %  Comprehensive metabolic panel     Status: Abnormal   Collection Time: 11/29/19  9:20 PM  Result Value Ref Range   Sodium 138 135 - 145 mmol/L   Potassium 4.0 3.5 - 5.1 mmol/L   Chloride 109 98 - 111 mmol/L   CO2 21 (L) 22 - 32 mmol/L   Glucose, Bld 72 70 - 99 mg/dL   BUN <5 (L) 6 - 20 mg/dL   Creatinine, Ser 0.55 0.44 - 1.00 mg/dL   Calcium 8.9 8.9 - 10.3 mg/dL   Total Protein 6.2 (L) 6.5 - 8.1 g/dL   Albumin 2.9 (L) 3.5 - 5.0 g/dL   AST 18 15 - 41 U/L   ALT 10 0 - 44 U/L   Alkaline Phosphatase 84 38 - 126 U/L   Total Bilirubin 0.5 0.3 - 1.2 mg/dL   GFR calc non Af Amer >60 >60 mL/min   GFR calc Af Amer >60 >60 mL/min   Anion gap 8 5 - 15  Urinalysis, Routine w reflex microscopic     Status: Abnormal   Collection Time: 11/29/19 10:01 PM  Result Value Ref Range   Color, Urine YELLOW YELLOW   APPearance HAZY (A) CLEAR   Specific Gravity, Urine 1.009 1.005 - 1.030   pH 7.0 5.0 - 8.0   Glucose, UA NEGATIVE NEGATIVE mg/dL   Hgb urine dipstick NEGATIVE NEGATIVE   Bilirubin Urine NEGATIVE NEGATIVE   Ketones, ur NEGATIVE NEGATIVE mg/dL   Protein, ur NEGATIVE NEGATIVE mg/dL  Nitrite NEGATIVE NEGATIVE   Leukocytes,Ua NEGATIVE  NEGATIVE  Protein / creatinine ratio, urine     Status: None   Collection Time: 11/29/19 10:03 PM  Result Value Ref Range   Creatinine, Urine 87.81 mg/dL   Total Protein, Urine 8 mg/dL   Protein Creatinine Ratio 0.09 0.00 - 0.15 mg/mg[Cre]   No results found.  MDM PE Labs: UA EFM Pain medication Assessment and Plan  24 year old G2P1001  SIUP at 53.3weeks Cat I FT DFM NR NST  -Offered and accepted pain medication -Tylenol XR to be given. -NST reactive currently. -Will send for BPP and reassess.  Cherre Robins MSN, CNM 11/30/2019, 2:50 PM   Reassessment (3:57 PM) BPP 8/10  -NST remains reactive  -Discussed follow up as scheduled. -Patient scheduled for elective IOL on Tuesday. -No questions or concerns. -Labor Precautions Given. -Encouraged to call or return to MAU if symptoms worsen or with the onset of new symptoms. -Discharged to home in stable condition.  Cherre Robins MSN, CNM Advanced Practice Provider, [facility] [employer] [facility]

## 2019-12-03 ENCOUNTER — Other Ambulatory Visit (HOSPITAL_COMMUNITY)
Admission: RE | Admit: 2019-12-03 | Discharge: 2019-12-03 | Disposition: A | Discharge status: Home / Self Care | Payer: Medicaid Other | Source: Ambulatory Visit | Attending: Obstetrics and Gynecology | Admitting: Obstetrics and Gynecology

## 2019-12-03 ENCOUNTER — Encounter: Payer: Self-pay | Admitting: Obstetrics and Gynecology

## 2019-12-03 ENCOUNTER — Encounter: Payer: Medicaid Other | Admitting: Obstetrics and Gynecology

## 2019-12-03 ENCOUNTER — Telehealth (INDEPENDENT_AMBULATORY_CARE_PROVIDER_SITE_OTHER): Payer: Medicaid Other | Admitting: Obstetrics and Gynecology

## 2019-12-03 DIAGNOSIS — Z3A38 38 weeks gestation of pregnancy: Secondary | ICD-10-CM

## 2019-12-03 DIAGNOSIS — O99820 Streptococcus B carrier state complicating pregnancy: Secondary | ICD-10-CM

## 2019-12-03 DIAGNOSIS — Z3480 Encounter for supervision of other normal pregnancy, unspecified trimester: Secondary | ICD-10-CM

## 2019-12-03 LAB — SARS CORONAVIRUS 2 (TAT 6-24 HRS): SARS Coronavirus 2: NEGATIVE

## 2019-12-03 NOTE — Progress Notes (Signed)
S/w pt for virtual visit, pt reports some fetal movement today but states that it usually is not a lot. Pt states that she cannot find her BP cuff today.

## 2019-12-03 NOTE — Progress Notes (Signed)
TELEHEALTH OBSTETRICS PRENATAL VIRTUAL VIDEO VISIT ENCOUNTER NOTE  Provider location: [facility] for [facility] [place]   I connected with Andrea Jenkins on 12/03/19 at  2:15 PM EST by MyChart Video Encounter at home and verified that I am speaking with the correct person using two identifiers.   I discussed the limitations, risks, security and privacy concerns of performing an evaluation and management service virtually and the availability of in person appointments. I also discussed with the patient that there may be a patient responsible charge related to this service. The patient expressed understanding and agreed to proceed. Subjective:  Andrea Jenkins is a 24 y.o. G2P1001 at [redacted]w[redacted]d being seen today for ongoing prenatal care.  She is currently monitored for the following issues for this low-risk pregnancy and has LGSIL on Pap smear of cervix; Blindness of right eye; Supervision of other normal pregnancy, antepartum; Short interval between pregnancies affecting pregnancy, antepartum; Marginal insertion of umbilical cord affecting management of mother; Fetal echogenic intracardiac focus on prenatal ultrasound; Alpha thalassemia silent carrier; and GBS (group B Streptococcus carrier), +RV culture, currently pregnant on their problem list.  Patient reports decreased fetal movement.  Contractions: Irregular. Vag. Bleeding: None.  Movement: Present. Denies any leaking of fluid.   The following portions of the patient's history were reviewed and updated as appropriate: allergies, current medications, past family history, past medical history, past social history, past surgical history and problem list.   Objective:  There were no vitals filed for this visit.  Fetal Status:     Movement: Present     General:  Alert, oriented and cooperative. Patient is in no acute distress.  Respiratory: Normal respiratory effort, no problems with respiration noted  Mental Status: Normal mood and  affect. Normal behavior. Normal judgment and thought content.  Rest of physical exam deferred due to type of encounter  Imaging: [place] MFM FETAL BPP WO NON STRESS  Result Date: 11/30/2019 ----------------------------------------------------------------------  OBSTETRICS REPORT                       (Signed Final 11/30/2019 05:15 pm) ---------------------------------------------------------------------- Patient Info  ID #:       [id number]                          D.O.B.:  15-Feb-1996 (23 yrs)  Name:       Andrea Jenkins [facility]               Visit Date: 11/30/2019 02:49 pm ---------------------------------------------------------------------- Performed By  Performed By:     Lenise Arena        Ref. Address:     Faculty                    RDMS  Attending:        Ma Rings MD         [facility]  Referred By:      Rudean Curt  ROGERS CNM ---------------------------------------------------------------------- Orders   #  Description                          Code         Ordered By   1  [place] MFM FETAL BPP WO NON              [id number]     NICOLE NUGENT      STRESS  ----------------------------------------------------------------------   #  Order #                    Accession #                 Episode #   1  [id number]                  [id number]                  [id number]  ---------------------------------------------------------------------- Indications   Non-reactive NST                               O28.9   Decreased fetal movement                       O36.8190   [redacted] weeks gestation of pregnancy                Z3A.38   Marginal insertion of umbilical cord affecting O43.193   management of mother in third trimester   Fetal abnormality - other known or             O35.9XX0   suspected (LVEIF and RVEIF)   Genetic carrier (silent carrier for alpha      Z14.8   thalassemia)   ---------------------------------------------------------------------- Vital Signs  Weight (lb): 194                               Height:        5'3"  BMI:         34.36 ---------------------------------------------------------------------- Fetal Evaluation  Num Of Fetuses:         1  Fetal Heart Rate(bpm):  131  Cardiac Activity:       Observed  Presentation:           Cephalic  Placenta:               Anterior Fundal  P. Cord Insertion:      Marginal insertion prev seen  Amniotic Fluid  AFI FV:      Within normal limits  AFI Sum(cm)     %Tile       Largest Pocket(cm)  16.15           64          6.57  RUQ(cm)       RLQ(cm)       LUQ(cm)        LLQ(cm)  6.57          3.58          3.65           2.35 ---------------------------------------------------------------------- Biophysical Evaluation  Amniotic F.V:   Within normal limits       F. Tone:        Observed  F. Movement:    Observed  N.S.T:          Nonreactive  F. Breathing:   Observed                   Score:          8/10 ---------------------------------------------------------------------- OB History  Gravidity:    2         Term:   1  Living:       1 ---------------------------------------------------------------------- Gestational Age  LMP:           38w 3d        Date:  03/06/19                 EDD:   12/11/19  Best:          38w 3d     Det. By:  LMP  (03/06/19)          EDD:   12/11/19 ---------------------------------------------------------------------- Anatomy  Thoracic:              Appears normal         Bladder:                Appears normal  Stomach:               Appears normal, left                         sided ---------------------------------------------------------------------- Cervix Uterus Adnexa  Cervix  Not visualized (advanced GA >24wks) ---------------------------------------------------------------------- Comments  A biophysical profile performed today due to a nonreactive  NST was 8 out of 8.  There was normal  amniotic fluid noted on today's ultrasound  exam. ----------------------------------------------------------------------                   Ma Rings, MD Electronically Signed Final Report   11/30/2019 05:15 pm ----------------------------------------------------------------------  [place] MFM FETAL BPP WO NON STRESS  Result Date: 11/16/2019 ----------------------------------------------------------------------  OBSTETRICS REPORT                       (Signed Final 11/16/2019 08:17 am) ---------------------------------------------------------------------- Patient Info  ID #:       [id number]                          D.O.B.:  1996/02/03 (23 yrs)  Name:       Andrea Jenkins [facility]               Visit Date: 11/16/2019 07:39 am ---------------------------------------------------------------------- Performed By  Performed By:     Tomma Lightning             Ref. Address:     Faculty                    RDMS,RVT  Attending:        Noralee Space MD        [facility]  Referred By:      Rudean Curt  ROGERS CNM ---------------------------------------------------------------------- Orders   #  Description                          Code         Ordered By   1  US MFM FETAL BPP WO NON              [id number]     [facility]      STRESS  ----------------------------------------------------------------------   #  Order #                    Accession #                 Episode #   1  [id number]                  [id number]                  [id number]  ---------------------------------------------------------------------- Indications   Polyhydramnios, third trimester, antepartum    O40.3XX0   condition or complication, unspecified fetus   Marginal insertion of umbilical cord affecting O43.193   management of mother in third trimester   [redacted] weeks gestation of pregnancy                Z3A.36   Fetal abnormality - other known or              O35.9XX0   suspected (LVEIF and RVEIF)   Genetic carrier (silent carrier for alpha      Z14.8   thalassemia)  ---------------------------------------------------------------------- Vital Signs                                                 Height:        5'3" ---------------------------------------------------------------------- Fetal Evaluation  Num Of Fetuses:         1  Fetal Heart Rate(bpm):  130  Cardiac Activity:       Observed  Presentation:           Cephalic  Placenta:               Anterior  P. Cord Insertion:      Marginal insertion prev seen  Amniotic Fluid  AFI FV:      Within normal limits  AFI Sum(cm)     %Tile       Largest Pocket(cm)  22.04           84          8.19  RUQ(cm)       RLQ(cm)       LUQ(cm)        LLQ(cm)  5.06          3.14          8.19           5.65 ---------------------------------------------------------------------- Biophysical Evaluation  Amniotic F.V:   Within normal limits       F. Tone:        Observed  F. Movement:    Observed                   Score:          8/8  F. Breathing:   Observed ---------------------------------------------------------------------- OB History  Gravidity:    2  Term:   1  Living:       1 ---------------------------------------------------------------------- Gestational Age  LMP:           36w 3d        Date:  03/06/19                 EDD:   12/11/19  Best:          Stevie Kern 3d     Det. By:  LMP  (03/06/19)          EDD:   12/11/19 ---------------------------------------------------------------------- Cervix Uterus Adnexa  Cervix  Not visualized (advanced GA >24wks) ---------------------------------------------------------------------- Impression  Marginal cord insertion.  Patient return for BPP because of  polyhydramnios seen on last ultrasound.  She reports good  fetal movements.  Fetal growth was appropriate for  gestational age on ultrasound performed last week.  On today's ultrasound, amniotic fluid is normal and good fetal  activity is  seen. Antenatal testing is reassuring. BPP 8/8.  Amniotic fluid is normal good fetal activity seen.  I reassured the patient of the findings.  Weekly antenatal  testing is not indicated in the absence of risk factors. ---------------------------------------------------------------------- Recommendations  Follow-up scans as clinically indicated. ----------------------------------------------------------------------                  Noralee Space, MD Electronically Signed Final Report   11/16/2019 08:17 am ----------------------------------------------------------------------  [place] MFM FETAL BPP WO NON STRESS  Result Date: 11/08/2019 ----------------------------------------------------------------------  OBSTETRICS REPORT                       (Signed Final 11/08/2019 03:58 pm) ---------------------------------------------------------------------- Patient Info  ID #:       [id number]                          D.O.B.:  1996/01/20 (23 yrs)  Name:       Andrea Jenkins [facility]               Visit Date: 11/08/2019 03:04 pm ---------------------------------------------------------------------- Performed By  Performed By:     Lenise Arena        Ref. Address:     Faculty                    RDMS  Attending:        Noralee Space MD        [facility]  Referred By:      Sharyon Cable CNM ---------------------------------------------------------------------- Orders   #  Description                          Code         Ordered By   1  [place] MFM OB FOLLOW UP                  [id number]  Rosana Hoes   2  [place] MFM FETAL BPP WO NON              [id number]     YU FANG      STRESS  ----------------------------------------------------------------------   #  Order #                    Accession #                 Episode #   1  [id number]                  [id number]                  [id number]   2  [id number]                  [id number]                   [id number]  ---------------------------------------------------------------------- Indications   Encounter for other antenatal screening        Z36.2   follow-up   Marginal insertion of umbilical cord affecting O43.192   management of mother in second trimester   Fetal abnormality - other known or             O35.9XX0   suspected (LVEIF and RVEIF)   Genetic carrier (silent carrier for alpha      Z14.8   thalassemia)   [redacted] weeks gestation of pregnancy                Z3A.35  ---------------------------------------------------------------------- Vital Signs  Weight (lb): 192                               Height:        5'3"  BMI:         34.01 ---------------------------------------------------------------------- Fetal Evaluation  Num Of Fetuses:         1  Fetal Heart Rate(bpm):  143  Cardiac Activity:       Observed  Presentation:           Cephalic  Placenta:               Anterior  P. Cord Insertion:      Marginal insertion  Amniotic Fluid  AFI FV:      Polyhydramnios  AFI Sum(cm)     %Tile       Largest Pocket(cm)  30.24           > 97        8.77  RUQ(cm)       RLQ(cm)       LUQ(cm)        LLQ(cm)  7.84          7.57          8.77           6.06 ---------------------------------------------------------------------- Biophysical Evaluation  Amniotic F.V:   Polyhydramnios             F. Tone:        Observed  F. Movement:    Observed                   Score:          8/8  F. Breathing:   Observed ---------------------------------------------------------------------- Biometry  BPD:      87.8  mm  G. Age:  35w 3d         60  %    CI:        77.42   %    70 - 86                                                          FL/HC:      20.7   %    20.1 - 22.3  HC:      315.9  mm     G. Age:  35w 3d         22  %    HC/AC:      0.99        0.93 - 1.11  AC:      319.4  mm     G. Age:  35w 6d         74  %    FL/BPD:     74.5   %    71 - 87  FL:       65.4  mm     G. Age:  33w 5d         10  %    FL/AC:       20.5   %    20 - 24  HUM:      56.8  mm     G. Age:  33w 0d         21  %  LV:        3.3  mm  Est. FW:    2623  gm    5 lb 13 oz      46  % ---------------------------------------------------------------------- OB History  Gravidity:    2         Term:   1  Living:       1 ---------------------------------------------------------------------- Gestational Age  LMP:           35w 2d        Date:  03/06/19                 EDD:   12/11/19  U/S Today:     35w 1d                                        EDD:   12/12/19  Best:          35w 2d     Det. By:  LMP  (03/06/19)          EDD:   12/11/19 ---------------------------------------------------------------------- Anatomy  Cranium:               Appears normal         Aortic Arch:            Previously seen  Cavum:                 Appears normal         Ductal Arch:            Previously seen  Ventricles:            Appears normal         Diaphragm:  Appears normal  Choroid Plexus:        Appears normal         Stomach:                Appears normal, left                                                                        sided  Cerebellum:            Appears normal         Abdomen:                Appears normal  Posterior Fossa:       Previously seen        Abdominal Wall:         Previously seen  Nuchal Fold:           Previously seen        Cord Vessels:           Appears normal (3                                                                        vessel cord)  Face:                  Appears normal         Kidneys:                Appear normal                         (orbits and profile)  Lips:                  Appears normal         Bladder:                Appears normal  Thoracic:              Appears normal         Spine:                  Previously seen  Heart:                 Echogenic focus        Upper Extremities:      Previously seen                         in LV and RV  RVOT:                  Appears normal         Lower Extremities:       Previously seen  LVOT:                  Appears normal  Other:  Heels and 5th digit previously visualized. Nasal bone visualized. ---------------------------------------------------------------------- Cervix Uterus Adnexa  Cervix  Not visualized (advanced GA >24wks) ---------------------------------------------------------------------- Impression  Marginal cord insertion.  Patient return for fetal growth  assessment.  Fetal growth is appropriate for gestational age.  Amniotic fluid  is increased (severe polyhydramnios).  Fetal stomach and  kidneys appear normal.  Antenatal testing is reassuring.  BPP  8/8.  Patient does not have gestational diabetes.  I explained that  polyhydramnios is usually idiopathic (no known cause). ---------------------------------------------------------------------- Recommendations  -Continue weekly BPP till delivery. ----------------------------------------------------------------------                  Noralee Space, MD Electronically Signed Final Report   11/08/2019 03:58 pm ----------------------------------------------------------------------  [place] MFM OB FOLLOW UP  Result Date: 11/08/2019 ----------------------------------------------------------------------  OBSTETRICS REPORT                       (Signed Final 11/08/2019 03:58 pm) ---------------------------------------------------------------------- Patient Info  ID #:       [id number]                          D.O.B.:  11/04/1995 (23 yrs)  Name:       Andrea Jenkins [facility]               Visit Date: 11/08/2019 03:04 pm ---------------------------------------------------------------------- Performed By  Performed By:     Lenise Arena        Ref. Address:     Faculty                    RDMS  Attending:        Noralee Space MD        [facility]  Referred By:      Sharyon Cable CNM  ---------------------------------------------------------------------- Orders   #  Description                          Code         Ordered By   1  [place] MFM OB FOLLOW UP                  76816.01     YU FANG   2  [place] MFM FETAL BPP WO NON              76819.01     YU FANG      STRESS  ----------------------------------------------------------------------   #  Order #                    Accession #                 Episode #   1  [id number]                  [id number]                  [id number]   2  [id number]                  [id number]                  [id number]  ---------------------------------------------------------------------- Indications   Encounter for other antenatal screening        Z36.2   follow-up   Marginal insertion of umbilical cord affecting O43.192   management of mother in second trimester   Fetal abnormality - other known or             O35.9XX0   suspected (LVEIF and RVEIF)   Genetic carrier (silent carrier for alpha      Z14.8   thalassemia)   [redacted] weeks gestation of pregnancy                Z3A.35  ---------------------------------------------------------------------- Vital Signs  Weight (lb): 192                               Height:        5'3"  BMI:         34.01 ---------------------------------------------------------------------- Fetal Evaluation  Num Of Fetuses:         1  Fetal Heart Rate(bpm):  143  Cardiac Activity:       Observed  Presentation:           Cephalic  Placenta:               Anterior  P. Cord Insertion:      Marginal insertion  Amniotic Fluid  AFI FV:      Polyhydramnios  AFI Sum(cm)     %Tile       Largest Pocket(cm)  30.24           > 97        8.77  RUQ(cm)       RLQ(cm)       LUQ(cm)        LLQ(cm)  7.84          7.57          8.77           6.06 ---------------------------------------------------------------------- Biophysical Evaluation  Amniotic F.V:   Polyhydramnios             F. Tone:        Observed  F. Movement:    Observed                   Score:          8/8  F.  Breathing:   Observed ---------------------------------------------------------------------- Biometry  BPD:      87.8  mm     G. Age:  35w 3d         60  %    CI:        77.42   %    70 - 86                                                          FL/HC:      20.7   %    20.1 - 22.3  HC:      315.9  mm     G. Age:  2w 3d  22  %    HC/AC:      0.99        0.93 - 1.11  AC:      319.4  mm     G. Age:  35w 6d         74  %    FL/BPD:     74.5   %    71 - 87  FL:       65.4  mm     G. Age:  33w 5d         10  %    FL/AC:      20.5   %    20 - 24  HUM:      56.8  mm     G. Age:  33w 0d         21  %  LV:        3.3  mm  Est. FW:    2623  gm    5 lb 13 oz      46  % ---------------------------------------------------------------------- OB History  Gravidity:    2         Term:   1  Living:       1 ---------------------------------------------------------------------- Gestational Age  LMP:           35w 2d        Date:  03/06/19                 EDD:   12/11/19  U/S Today:     35w 1d                                        EDD:   12/12/19  Best:          35w 2d     Det. By:  LMP  (03/06/19)          EDD:   12/11/19 ---------------------------------------------------------------------- Anatomy  Cranium:               Appears normal         Aortic Arch:            Previously seen  Cavum:                 Appears normal         Ductal Arch:            Previously seen  Ventricles:            Appears normal         Diaphragm:              Appears normal  Choroid Plexus:        Appears normal         Stomach:                Appears normal, left                                                                        sided  Cerebellum:  Appears normal         Abdomen:                Appears normal  Posterior Fossa:       Previously seen        Abdominal Wall:         Previously seen  Nuchal Fold:           Previously seen        Cord Vessels:           Appears normal (3                                                                         vessel cord)  Face:                  Appears normal         Kidneys:                Appear normal                         (orbits and profile)  Lips:                  Appears normal         Bladder:                Appears normal  Thoracic:              Appears normal         Spine:                  Previously seen  Heart:                 Echogenic focus        Upper Extremities:      Previously seen                         in LV and RV  RVOT:                  Appears normal         Lower Extremities:      Previously seen  LVOT:                  Appears normal  Other:  Heels and 5th digit previously visualized. Nasal bone visualized. ---------------------------------------------------------------------- Cervix Uterus Adnexa  Cervix  Not visualized (advanced GA >24wks) ---------------------------------------------------------------------- Impression  Marginal cord insertion.  Patient return for fetal growth  assessment.  Fetal growth is appropriate for gestational age.  Amniotic fluid  is increased (severe polyhydramnios).  Fetal stomach and  kidneys appear normal.  Antenatal testing is reassuring.  BPP  8/8.  Patient does not have gestational diabetes.  I explained that  polyhydramnios is usually idiopathic (no known cause). ---------------------------------------------------------------------- Recommendations  -Continue weekly BPP till delivery. ----------------------------------------------------------------------                  Tama High, MD Electronically Signed Final Report   11/08/2019 03:58 pm ----------------------------------------------------------------------   Assessment and Plan:  Pregnancy: G2P1001 at [redacted]w[redacted]d 1. Supervision of  other normal pregnancy, antepartum Patient is doing well reporting decrease fetal movement. Patient was seen on 11/30/2019 with same complaints and discharged home with reassuring fetal testing Advised patient to return today for evaluation. She plans  on waiting until 3:19pm and plans to go to [facility] if no fetal movement Patient scheduled for IOL tomorrow Patient plans patch for contraception  2. GBS (group B Streptococcus carrier), +RV culture, currently pregnant Prophylaxis in labor  Term labor symptoms and general obstetric precautions including but not limited to vaginal bleeding, contractions, leaking of fluid and fetal movement were reviewed in detail with the patient. I discussed the assessment and treatment plan with the patient. The patient was provided an opportunity to ask questions and all were answered. The patient agreed with the plan and demonstrated an understanding of the instructions. The patient was advised to call back or seek an in-person office evaluation/go to MAU at [facility] for any urgent or concerning symptoms. Please refer to After Visit Summary for other counseling recommendations.   I provided 11 minutes of face-to-face time during this encounter.  No follow-ups on file.  Future Appointments  Date Time Provider Department Center  12/04/2019  7:00 AM [facility] SCHED ROOM [facility] None  12/12/2019 10:30 AM Adam Phenix, MD [facility] None    Catalina Antigua, MD Center for [facility], [facility]

## 2019-12-04 ENCOUNTER — Inpatient Hospital Stay (HOSPITAL_COMMUNITY)
Admission: AD | Admit: 2019-12-04 | Discharge: 2019-12-06 | DRG: 807 | Disposition: A | Discharge status: Home / Self Care | Payer: Medicaid Other | Attending: Family Medicine | Admitting: Family Medicine

## 2019-12-04 ENCOUNTER — Encounter (HOSPITAL_COMMUNITY): Payer: Self-pay | Admitting: Obstetrics and Gynecology

## 2019-12-04 ENCOUNTER — Other Ambulatory Visit: Payer: Self-pay

## 2019-12-04 ENCOUNTER — Inpatient Hospital Stay (HOSPITAL_COMMUNITY): Payer: Medicaid Other | Admitting: Anesthesiology

## 2019-12-04 ENCOUNTER — Inpatient Hospital Stay (HOSPITAL_COMMUNITY): Payer: Medicaid Other

## 2019-12-04 DIAGNOSIS — Z3480 Encounter for supervision of other normal pregnancy, unspecified trimester: Secondary | ICD-10-CM

## 2019-12-04 DIAGNOSIS — Z87891 Personal history of nicotine dependence: Secondary | ICD-10-CM | POA: Diagnosis not present

## 2019-12-04 DIAGNOSIS — O99820 Streptococcus B carrier state complicating pregnancy: Secondary | ICD-10-CM

## 2019-12-04 DIAGNOSIS — O9952 Diseases of the respiratory system complicating childbirth: Secondary | ICD-10-CM | POA: Diagnosis present

## 2019-12-04 DIAGNOSIS — D563 Thalassemia minor: Secondary | ICD-10-CM | POA: Diagnosis present

## 2019-12-04 DIAGNOSIS — O43199 Other malformation of placenta, unspecified trimester: Secondary | ICD-10-CM | POA: Diagnosis present

## 2019-12-04 DIAGNOSIS — F329 Major depressive disorder, single episode, unspecified: Secondary | ICD-10-CM | POA: Diagnosis present

## 2019-12-04 DIAGNOSIS — O43123 Velamentous insertion of umbilical cord, third trimester: Secondary | ICD-10-CM | POA: Diagnosis not present

## 2019-12-04 DIAGNOSIS — Z3A39 39 weeks gestation of pregnancy: Secondary | ICD-10-CM

## 2019-12-04 DIAGNOSIS — O99824 Streptococcus B carrier state complicating childbirth: Secondary | ICD-10-CM | POA: Diagnosis present

## 2019-12-04 DIAGNOSIS — O99344 Other mental disorders complicating childbirth: Secondary | ICD-10-CM | POA: Diagnosis present

## 2019-12-04 DIAGNOSIS — F419 Anxiety disorder, unspecified: Secondary | ICD-10-CM

## 2019-12-04 DIAGNOSIS — Z20822 Contact with and (suspected) exposure to covid-19: Secondary | ICD-10-CM | POA: Diagnosis present

## 2019-12-04 DIAGNOSIS — H5461 Unqualified visual loss, right eye, normal vision left eye: Secondary | ICD-10-CM | POA: Diagnosis present

## 2019-12-04 DIAGNOSIS — J45909 Unspecified asthma, uncomplicated: Secondary | ICD-10-CM | POA: Diagnosis present

## 2019-12-04 DIAGNOSIS — O09899 Supervision of other high risk pregnancies, unspecified trimester: Secondary | ICD-10-CM

## 2019-12-04 DIAGNOSIS — H5440 Blindness, one eye, unspecified eye: Secondary | ICD-10-CM | POA: Diagnosis present

## 2019-12-04 DIAGNOSIS — Z23 Encounter for immunization: Secondary | ICD-10-CM

## 2019-12-04 DIAGNOSIS — O283 Abnormal ultrasonic finding on antenatal screening of mother: Secondary | ICD-10-CM | POA: Diagnosis present

## 2019-12-04 HISTORY — DX: Anxiety disorder, unspecified: F41.9

## 2019-12-04 HISTORY — DX: Depression, unspecified: F32.A

## 2019-12-04 LAB — ABO/RH: ABO/RH(D): A POS

## 2019-12-04 LAB — CBC
HCT: 31.4 % — ABNORMAL LOW (ref 36.0–46.0)
Hemoglobin: 10.2 g/dL — ABNORMAL LOW (ref 12.0–15.0)
MCH: 27.3 pg (ref 26.0–34.0)
MCHC: 32.5 g/dL (ref 30.0–36.0)
MCV: 84 fL (ref 80.0–100.0)
Platelets: 148 10*3/uL — ABNORMAL LOW (ref 150–400)
RBC: 3.74 MIL/uL — ABNORMAL LOW (ref 3.87–5.11)
RDW: 13.9 % (ref 11.5–15.5)
WBC: 7.1 10*3/uL (ref 4.0–10.5)
nRBC: 0 % (ref 0.0–0.2)

## 2019-12-04 LAB — RPR: RPR Ser Ql: NONREACTIVE

## 2019-12-04 LAB — TYPE AND SCREEN
ABO/RH(D): A POS
Antibody Screen: NEGATIVE

## 2019-12-04 MED ORDER — PRENATAL MULTIVITAMIN CH
1.0000 | ORAL_TABLET | Freq: Every day | ORAL | Status: DC
  Administered 2019-12-05 – 2019-12-06 (×2): 1 via ORAL
  Filled 2019-12-04 (×2): qty 1

## 2019-12-04 MED ORDER — LIDOCAINE HCL (PF) 1 % IJ SOLN
30.0000 mL | INTRAMUSCULAR | Status: AC | PRN
  Administered 2019-12-04: 21:00:00 30 mL via SUBCUTANEOUS
  Filled 2019-12-04: qty 30

## 2019-12-04 MED ORDER — PHENYLEPHRINE 40 MCG/ML (10ML) SYRINGE FOR IV PUSH (FOR BLOOD PRESSURE SUPPORT): 80.0000 ug | PREFILLED_SYRINGE | INTRAVENOUS | Status: DC | PRN

## 2019-12-04 MED ORDER — EPHEDRINE 5 MG/ML INJ: 10.0000 mg | INTRAVENOUS | Status: DC | PRN

## 2019-12-04 MED ORDER — LACTATED RINGERS IV SOLN: INTRAVENOUS | Status: DC

## 2019-12-04 MED ORDER — TETANUS-DIPHTH-ACELL PERTUSSIS 5-2.5-18.5 LF-MCG/0.5 IM SUSP
0.5000 mL | Freq: Once | INTRAMUSCULAR | Status: AC
  Administered 2019-12-05: 13:00:00 0.5 mL via INTRAMUSCULAR
  Filled 2019-12-04: qty 0.5

## 2019-12-04 MED ORDER — PENICILLIN G POT IN DEXTROSE 60000 UNIT/ML IV SOLN
3.0000 10*6.[IU] | INTRAVENOUS | Status: DC
  Administered 2019-12-04 (×3): 3 10*6.[IU] via INTRAVENOUS
  Filled 2019-12-04 (×3): qty 50

## 2019-12-04 MED ORDER — SODIUM CHLORIDE 0.9 % IV SOLN
5.0000 10*6.[IU] | Freq: Once | INTRAVENOUS | Status: AC
  Administered 2019-12-04: 09:00:00 5 10*6.[IU] via INTRAVENOUS
  Filled 2019-12-04: qty 5

## 2019-12-04 MED ORDER — OXYCODONE-ACETAMINOPHEN 5-325 MG PO TABS: 2.0000 | ORAL_TABLET | ORAL | Status: DC | PRN

## 2019-12-04 MED ORDER — ACETAMINOPHEN 325 MG PO TABS
650.0000 mg | ORAL_TABLET | ORAL | Status: DC | PRN
  Administered 2019-12-05: 17:00:00 650 mg via ORAL
  Filled 2019-12-04: qty 2

## 2019-12-04 MED ORDER — SODIUM CHLORIDE (PF) 0.9 % IJ SOLN
INTRAMUSCULAR | Status: DC | PRN
  Administered 2019-12-04: 12 mL/h via EPIDURAL

## 2019-12-04 MED ORDER — WITCH HAZEL-GLYCERIN EX PADS: 1.0000 "application " | MEDICATED_PAD | CUTANEOUS | Status: DC | PRN

## 2019-12-04 MED ORDER — MISOPROSTOL 50MCG HALF TABLET
ORAL_TABLET | ORAL | Status: AC
  Filled 2019-12-04: qty 1

## 2019-12-04 MED ORDER — SENNOSIDES-DOCUSATE SODIUM 8.6-50 MG PO TABS
2.0000 | ORAL_TABLET | ORAL | Status: DC
  Administered 2019-12-04 – 2019-12-05 (×2): 2 via ORAL
  Filled 2019-12-04 (×2): qty 2

## 2019-12-04 MED ORDER — METHYLERGONOVINE MALEATE 0.2 MG/ML IJ SOLN: 0.2000 mg | INTRAMUSCULAR | Status: DC | PRN

## 2019-12-04 MED ORDER — MISOPROSTOL 50MCG HALF TABLET
50.0000 ug | ORAL_TABLET | ORAL | Status: DC | PRN
  Administered 2019-12-04: 09:00:00 50 ug via BUCCAL

## 2019-12-04 MED ORDER — OXYTOCIN 40 UNITS IN NORMAL SALINE INFUSION - SIMPLE MED
1.0000 m[IU]/min | INTRAVENOUS | Status: DC
  Administered 2019-12-04: 2 m[IU]/min via INTRAVENOUS

## 2019-12-04 MED ORDER — ONDANSETRON HCL 4 MG/2ML IJ SOLN: 4.0000 mg | INTRAMUSCULAR | Status: DC | PRN

## 2019-12-04 MED ORDER — FENTANYL CITRATE (PF) 100 MCG/2ML IJ SOLN
50.0000 ug | INTRAMUSCULAR | Status: DC | PRN
  Administered 2019-12-04: 50 ug via INTRAVENOUS
  Administered 2019-12-04 (×2): 100 ug via INTRAVENOUS
  Administered 2019-12-04: 50 ug via INTRAVENOUS
  Filled 2019-12-04 (×3): qty 2

## 2019-12-04 MED ORDER — IBUPROFEN 600 MG PO TABS
600.0000 mg | ORAL_TABLET | Freq: Four times a day (QID) | ORAL | Status: DC
  Administered 2019-12-04 – 2019-12-06 (×7): 600 mg via ORAL
  Filled 2019-12-04 (×7): qty 1

## 2019-12-04 MED ORDER — MEDROXYPROGESTERONE ACETATE 150 MG/ML IM SUSP: 150.0000 mg | INTRAMUSCULAR | Status: DC | PRN

## 2019-12-04 MED ORDER — ONDANSETRON HCL 4 MG PO TABS: 4.0000 mg | ORAL_TABLET | ORAL | Status: DC | PRN

## 2019-12-04 MED ORDER — DIPHENHYDRAMINE HCL 25 MG PO CAPS
25.0000 mg | ORAL_CAPSULE | Freq: Four times a day (QID) | ORAL | Status: DC | PRN
  Administered 2019-12-04: 25 mg via ORAL
  Filled 2019-12-04: qty 1

## 2019-12-04 MED ORDER — OXYTOCIN BOLUS FROM INFUSION
500.0000 mL | Freq: Once | INTRAVENOUS | Status: AC
  Administered 2019-12-04: 500 mL via INTRAVENOUS

## 2019-12-04 MED ORDER — DIBUCAINE (PERIANAL) 1 % EX OINT: 1.0000 "application " | TOPICAL_OINTMENT | CUTANEOUS | Status: DC | PRN

## 2019-12-04 MED ORDER — OXYTOCIN 40 UNITS IN NORMAL SALINE INFUSION - SIMPLE MED
2.5000 [IU]/h | INTRAVENOUS | Status: DC
  Administered 2019-12-04: 2.5 [IU]/h via INTRAVENOUS
  Filled 2019-12-04: qty 1000

## 2019-12-04 MED ORDER — SOD CITRATE-CITRIC ACID 500-334 MG/5ML PO SOLN: 30.0000 mL | ORAL | Status: DC | PRN

## 2019-12-04 MED ORDER — METHYLERGONOVINE MALEATE 0.2 MG/ML IJ SOLN: 0.2000 mg | Freq: Once | INTRAMUSCULAR | Status: DC

## 2019-12-04 MED ORDER — COCONUT OIL OIL: 1.0000 "application " | TOPICAL_OIL | Status: DC | PRN

## 2019-12-04 MED ORDER — MEASLES, MUMPS & RUBELLA VAC IJ SOLR: 0.5000 mL | Freq: Once | INTRAMUSCULAR | Status: DC

## 2019-12-04 MED ORDER — TERBUTALINE SULFATE 1 MG/ML IJ SOLN: 0.2500 mg | Freq: Once | INTRAMUSCULAR | Status: DC | PRN

## 2019-12-04 MED ORDER — LIDOCAINE HCL (PF) 1 % IJ SOLN
INTRAMUSCULAR | Status: DC | PRN
  Administered 2019-12-04: 5 mL via EPIDURAL

## 2019-12-04 MED ORDER — BENZOCAINE-MENTHOL 20-0.5 % EX AERO
1.0000 "application " | INHALATION_SPRAY | CUTANEOUS | Status: DC | PRN
  Administered 2019-12-05: 1 via TOPICAL
  Filled 2019-12-04: qty 56

## 2019-12-04 MED ORDER — ONDANSETRON HCL 4 MG/2ML IJ SOLN: 4.0000 mg | Freq: Four times a day (QID) | INTRAMUSCULAR | Status: DC | PRN

## 2019-12-04 MED ORDER — DIPHENHYDRAMINE HCL 50 MG/ML IJ SOLN: 12.5000 mg | INTRAMUSCULAR | Status: DC | PRN

## 2019-12-04 MED ORDER — LACTATED RINGERS IV SOLN: 500.0000 mL | INTRAVENOUS | Status: DC | PRN

## 2019-12-04 MED ORDER — METHYLERGONOVINE MALEATE 0.2 MG/ML IJ SOLN
INTRAMUSCULAR | Status: AC
  Administered 2019-12-04: 0.2 mg via INTRAMUSCULAR
  Filled 2019-12-04: qty 1

## 2019-12-04 MED ORDER — FENTANYL-BUPIVACAINE-NACL 0.5-0.125-0.9 MG/250ML-% EP SOLN
12.0000 mL/h | EPIDURAL | Status: DC | PRN
  Filled 2019-12-04: qty 250

## 2019-12-04 MED ORDER — SIMETHICONE 80 MG PO CHEW
80.0000 mg | CHEWABLE_TABLET | ORAL | Status: DC | PRN
  Administered 2019-12-05: 17:00:00 80 mg via ORAL
  Filled 2019-12-04: qty 1

## 2019-12-04 MED ORDER — ACETAMINOPHEN 325 MG PO TABS: 650.0000 mg | ORAL_TABLET | ORAL | Status: DC | PRN

## 2019-12-04 MED ORDER — METHYLERGONOVINE MALEATE 0.2 MG PO TABS: 0.2000 mg | ORAL_TABLET | ORAL | Status: DC | PRN

## 2019-12-04 MED ORDER — LACTATED RINGERS IV SOLN
500.0000 mL | Freq: Once | INTRAVENOUS | Status: AC
  Administered 2019-12-04: 18:00:00 500 mL via INTRAVENOUS

## 2019-12-04 MED ORDER — OXYCODONE-ACETAMINOPHEN 5-325 MG PO TABS: 1.0000 | ORAL_TABLET | ORAL | Status: DC | PRN

## 2019-12-04 NOTE — Discharge Summary (Signed)
Postpartum Discharge Summary     Patient Name: Andrea Jenkins DOB: Mar 23, 1996 MRN: [id number]  Date of admission: 12/04/2019 Delivering Provider: Clarnce Flock   Date of discharge: 12/06/2019  Admitting diagnosis: Marginal insertion of umbilical cord affecting management of mother [O43.199] Intrauterine pregnancy: [redacted]w[redacted]d    Secondary diagnosis:  Active Problems:   Blindness of right eye   Supervision of other normal pregnancy, antepartum   Marginal insertion of umbilical cord affecting management of mother   Fetal echogenic intracardiac focus on prenatal ultrasound   Alpha thalassemia silent carrier   GBS (group B Streptococcus carrier), +RV culture, currently pregnant   Anxiety  Additional problems: Anxiety/depression     Discharge diagnosis: Term Pregnancy Delivered                                                                                                Post partum procedures:None  Augmentation: AROM, Pitocin, Cytotec and Foley Balloon  Complications: None  Hospital course:  Induction of Labor With Vaginal Delivery   24y.o. yo G2P1001 at 360w0das admitted to the hospital 12/04/2019 for induction of labor.  Indication for induction: marginal cord insertion at 39 weeks.  Patient had an uncomplicated labor course as follows: induced w foley bulb, misoprostol x1, pitocin and AROM and progressed to complete.  Membrane Rupture Time/Date: 5:05 PM ,12/04/2019   Intrapartum Procedures: Episiotomy: None [1]                                         Lacerations:  Periurethral [8]  Patient had delivery of a Viable infant.  Information for the patient's newborn:  [place], [place]0[[id number]]Delivery Method: Vaginal, Spontaneous(Filed from Delivery Summary)    12/04/2019  Details of delivery can be found in separate delivery note.   Postpartum patient seen by SW due to elevated EdLesothond endorsed symptoms of anxiety/depression, started on Zoloft and message sent to  [facility].  Otherwise patient had a routine postpartum course. Patient is discharged home 12/06/19. Delivery time: 9:05 PM    Magnesium Sulfate received: No BMZ received: No Rhophylac:N/A MMR:N/A Transfusion:No  Physical exam  Vitals:   12/05/19 0820 12/05/19 1230 12/05/19 2213 12/06/19 0537  BP: 114/63 100/62 (!) 107/45 100/60  Pulse: 63 62 63 61  Resp: _0 Temp: 98.1 F (36.7 C) 97.7 F (36.5 C) 98.4 F (36.9 C) 98.1 F (36.7 C)  TempSrc: Oral Oral Oral Oral  SpO2:   100% 100%  Weight:      Height:       General: alert, cooperative and no distress Lochia: appropriate Uterine Fundus: firm Incision: N/A DVT Evaluation: No evidence of DVT seen on physical exam. No significant calf/ankle edema. Labs: Lab Results  Component Value Date   WBC 7.1 12/04/2019   HGB 10.2 (L) 12/04/2019   HCT 31.4 (L) 12/04/2019   MCV 84.0 12/04/2019   PLT 148 (L) 12/04/2019   CMP Latest Ref Rng & Units  11/29/2019  Glucose 70 - 99 mg/dL 72  BUN 6 - 20 mg/dL <5(L)  Creatinine 0.44 - 1.00 mg/dL 0.55  Sodium 135 - 145 mmol/L 138  Potassium 3.5 - 5.1 mmol/L 4.0  Chloride 98 - 111 mmol/L 109  CO2 22 - 32 mmol/L 21(L)  Calcium 8.9 - 10.3 mg/dL 8.9  Total Protein 6.5 - 8.1 g/dL 6.2(L)  Total Bilirubin 0.3 - 1.2 mg/dL 0.5  Alkaline Phos 38 - 126 U/L 84  AST 15 - 41 U/L 18  ALT 0 - 44 U/L 10   Edinburgh Score: Edinburgh Postnatal Depression Scale Screening Tool 12/05/2019  I have been able to laugh and see the funny side of things. 0  I have looked forward with enjoyment to things. 2  I have blamed myself unnecessarily when things went wrong. 2  I have been anxious or worried for no good reason. 2  I have felt scared or panicky for no good reason. 1  Things have been getting on top of me. 1  I have been so unhappy that I have had difficulty sleeping. 2  I have felt sad or miserable. 2  I have been so unhappy that I have been crying. 1  The thought of harming  myself has occurred to me. 0  Edinburgh Postnatal Depression Scale Total 13    Discharge instruction: per After Visit Summary and "Baby and Me Booklet".  After visit meds:  Allergies as of 12/06/2019      Reactions   Peach [prunus Persica] Hives      Medication List    TAKE these medications   acetaminophen 325 MG tablet Commonly known as: Tylenol Take 2 tablets (650 mg total) by mouth every 4 (four) hours as needed (for pain scale < 4). What changed:   medication strength  how much to take  when to take this  reasons to take this   cyclobenzaprine 5 MG tablet Commonly known as: FLEXERIL Take 1 tablet (5 mg total) by mouth at bedtime as needed for muscle spasms.   ibuprofen 600 MG tablet Commonly known as: ADVIL Take 1 tablet (600 mg total) by mouth every 6 (six) hours.   polyethylene glycol powder 17 GM/SCOOP powder Commonly known as: GLYCOLAX/MIRALAX Take 255 g by mouth once for 1 dose.   sertraline 25 MG tablet Commonly known as: ZOLOFT Take 1 tablet (25 mg total) by mouth daily.   Vitafol Ultra 29-0.6-0.4-200 MG Caps Take 1 tablet by mouth daily.       Diet: routine diet  Activity: Advance as tolerated. Pelvic rest for 6 weeks.   Outpatient follow up:6 weeks Follow up Appt: Future Appointments  Date Time Provider [place]  01/15/2020  1:00 PM Lajean Manes, CNM [facility] None   Follow up Visit:  Please schedule this patient for Postpartum visit in: 6 weeks with the following provider: Any provider Virtual For C/S patients schedule nurse incision check in weeks 2 weeks: no Low risk pregnancy complicated by: marginal cord insertion Delivery mode:  SVD Anticipated Birth Control:  patch at 6 wks PP Procedures needed: none  Schedule Integrated Reyno visit: no   Newborn Data: Live born female  Birth Weight:  3201 grams APGAR: 48, 9  Newborn Delivery   Birth date/time: 12/04/2019 21:05:00 Delivery type: Vaginal, Spontaneous      Baby  Feeding: Bottle and Breast Disposition:home with mother   12/06/2019 Clarnce Flock, MD

## 2019-12-04 NOTE — Progress Notes (Signed)
Labor Progress Note CHRISTIE VISCOMI is a 24 y.o. G2P1001 at [redacted]w[redacted]d presented for IOL d/t marginal cord insertion S: Patient resting comfortably  O:  BP 110/61   Pulse 78   Temp 98.1 F (36.7 C) (Oral)   Resp 16   Ht 5' 3.5" (1.613 m)   Wt 88 kg   LMP 03/06/2019 (Exact Date)   BMI 33.83 kg/m  EFM: 140 bpm /+accels/-decels  CVE: Dilation: 4 Effacement (%): 80 Cervical Position: Anterior Station: -2 Presentation: Vertex Exam by:: Dr. Leary Roca   A&P: 24 y.o. G2P1001 [redacted]w[redacted]d IOL d/t marginal cord insertion #Labor: Progressing well. FB out, s/p cyto x1, on pitocin. AROM now for clear with descent of station from -2 to -1, sutures clearly felt, fetal head well applied to cervix. #Pain: Per patient request, likely will plan to receive epidural soon #FWB: Cat I #GBS positive PCN ppx adequate  Shirlean Mylar, MD 5:14 PM

## 2019-12-04 NOTE — Progress Notes (Signed)
Labor Progress Note Andrea Jenkins is a 24 y.o. G2P1001 at [redacted]w[redacted]d presented for IOL d/t marginal cord insertion S: Patient resting comfortably, FB out.  O:  BP (!) 96/51   Pulse 80   Temp 98.8 F (37.1 C) (Oral)   Resp 16   Ht 5' 3.5" (1.613 m)   Wt 88 kg   LMP 03/06/2019 (Exact Date)   BMI 33.83 kg/m  EFM: 135 bpm /+accels/-decels  CVE: Dilation: 4 Effacement (%): 80 Cervical Position: Anterior Station: -2 Presentation: Vertex Exam by:: Dr. Leary Roca   A&P: 24 y.o. G2P1001 103w0d IOL d/t marginal cord insertion #Labor: Progressing well. FB out, s/p cyto x1. Plan to start pitocin now and titrate as needed. #Pain: Per patient request, likely will plan to receive epidural soon #FWB: Cat I #GBS positive PCN ppx adequate  Shirlean Mylar, MD 1:43 PM

## 2019-12-04 NOTE — H&P (Signed)
OBSTETRIC ADMISSION HISTORY AND PHYSICAL  Andrea Jenkins is a 24 y.o. female G2P1001 with IUP at [redacted]w[redacted]d by LMP presenting for IOL for marginal cord insertion. She reports +FMs, No LOF, no VB, no blurry vision, headaches or peripheral edema, and RUQ pain.  She plans on breast and bottle feeding. She requests the patch after 6 weeks for birth control. She received her prenatal care at [place]: By LMP --->  Estimated Date of Delivery: 12/11/19  Sono:  1/14  @[redacted]w[redacted]d , CWD, normal anatomy, cephalic presentation, anterior placental lie, 2623g, 46% EFW  Prenatal History/Complications: Silent carrier for alpha thalassemia Marginal Cord insertion GBS pos Resolved polyhydramnios  Past Medical History: Past Medical History:  Diagnosis Date  . Anxiety   . Asthma    pt states she doesn't use inhaler much (04/10/19)  . Depression   . Gonorrhea   . Heart murmur    when younger  . Vaginal Pap smear, abnormal     Past Surgical History: Past Surgical History:  Procedure Laterality Date  . CATARACT EXTRACTION    . WISDOM TOOTH EXTRACTION    . WRIST SURGERY      Obstetrical History: OB History    Gravida  2   Para  1   Term  1   Preterm  0   AB  0   Living  1     SAB  0   TAB  0   Ectopic  0   Multiple  0   Live Births  1           Social History: Social History   Socioeconomic History  . Marital status: Single    Spouse name: Not on file  . Number of children: Not on file  . Years of education: Not on file  . Highest education level: Not on file  Occupational History  . Not on file  Tobacco Use  . Smoking status: Former Smoker    Packs/day: 0.25    Types: Cigars    Quit date: 03/11/2017    Years since quitting: 2.7  . Smokeless tobacco: Never Used  Substance and Sexual Activity  . Alcohol use: No    Alcohol/week: 0.0 standard drinks    Comment: mimosa on saturday. 04/06/19  . Drug use: Not Currently    Types: Marijuana  . Sexual activity:  Yes    Partners: Male    Birth control/protection: None  Other Topics Concern  . Not on file  Social History Narrative  . Not on file   Social Determinants of Health   Financial Resource Strain:   . Difficulty of Paying Living Expenses: Not on file  Food Insecurity:   . Worried About Charity fundraiser in the Last Year: Not on file  . Ran Out of Food in the Last Year: Not on file  Transportation Needs:   . Lack of Transportation (Medical): Not on file  . Lack of Transportation (Non-Medical): Not on file  Physical Activity:   . Days of Exercise per Week: Not on file  . Minutes of Exercise per Session: Not on file  Stress:   . Feeling of Stress : Not on file  Social Connections:   . Frequency of Communication with Friends and Family: Not on file  . Frequency of Social Gatherings with Friends and Family: Not on file  . Attends Religious Services: Not on file  . Active Member of Clubs or Organizations: Not on file  . Attends Club  or Organization Meetings: Not on file  . Marital Status: Not on file    Family History: Family History  Problem Relation Age of Onset  . Hypertension Mother   . Miscarriages / [place] Mother   . Hypertension Maternal Grandmother   . Asthma Maternal Grandmother   . Diabetes Maternal Grandmother   . Vision loss Maternal Grandmother   . Mental illness Brother   . Cancer Maternal Aunt   . Mental illness Sister   . Heart disease Neg Hx     Allergies: Allergies  Allergen Reactions  . Peach [Prunus Persica] Hives    Medications Prior to Admission  Medication Sig Dispense Refill Last Dose  . acetaminophen (TYLENOL) 500 MG tablet Take 500 mg by mouth every 6 (six) hours as needed.     . cyclobenzaprine (FLEXERIL) 5 MG tablet Take 1 tablet (5 mg total) by mouth at bedtime as needed for muscle spasms. 15 tablet 0   . Prenat-Fe Poly-Methfol-FA-DHA (VITAFOL ULTRA) 29-0.6-0.4-200 MG CAPS Take 1 tablet by mouth daily. 30 capsule 12      Review  of Systems   All systems reviewed and negative except as stated in HPI  Blood pressure (!) 111/59, pulse 85, temperature 98.8 F (37.1 C), temperature source Oral, resp. rate 16, height 5' 3.5" (1.613 m), weight 88 kg, last menstrual period 03/06/2019, not currently breastfeeding. General appearance: alert, cooperative and appears stated age Lungs: normal effort Heart: regular rate  Abdomen: soft, non-tender; bowel sounds normal Pelvic: gravid uterus GU: No vaginal lesions  Extremities: Homans sign is negative, no sign of DVT Presentation: cephalic by BSUS Fetal monitoringBaseline: 140 bpm, Variability: Good {> 6 bpm), Accelerations: Reactive and Decelerations: Absent Uterine activity: Frequency: Every 2-4 minutes Dilation: Closed Effacement (%): Thick Station: -3 Exam by:: Albertine Grates RN   Prenatal labs: ABO, Rh: --/--/A POS (02/09 0758) Antibody: NEG (02/09 0758) Rubella: 2.41 (08/05 1349) RPR: Non Reactive (12/02 1022)  HBsAg: Negative (08/05 1349)  HIV: Non Reactive (12/02 1022)  GBS: --[name] (01/20 0228)  2 hr Glucola WNL Genetic screening  Low risk female Anatomy US intracardiac echogenic focus and marginal cord insertion  Prenatal Transfer Tool  Maternal Diabetes: No Genetic Screening: Normal Maternal Ultrasounds/Referrals: intracardiac echogenic focus Fetal Ultrasounds or other Referrals:  None Maternal Substance Abuse:  No Significant Maternal Medications:  None Significant Maternal Lab Results: Group B Strep positive  Results for orders placed or performed during the hospital encounter of 12/04/19 (from the past 24 hour(s))  CBC   Collection Time: 12/04/19  7:58 AM  Result Value Ref Range   WBC 7.1 4.0 - 10.5 K/uL   RBC 3.74 (L) 3.87 - 5.11 MIL/uL   Hemoglobin 10.2 (L) 12.0 - 15.0 g/dL   HCT 34.1 (L) 96.2 - 22.9 %   MCV 84.0 80.0 - 100.0 fL   MCH 27.3 26.0 - 34.0 pg   MCHC 32.5 30.0 - 36.0 g/dL   RDW 79.8 92.1 - 19.4 %   Platelets 148 (L) 150 -  400 K/uL   nRBC 0.0 0.0 - 0.2 %  Type and screen   Collection Time: 12/04/19  7:58 AM  Result Value Ref Range   ABO/RH(D) A POS    Antibody Screen NEG    Sample Expiration      12/07/2019,2359 Performed at [facility], [address], [place], [place] [address]     Patient Active Problem List   Diagnosis Date Noted  . GBS (group B Streptococcus carrier), +RV  culture, currently pregnant 11/19/2019  . Alpha thalassemia silent carrier 10/24/2019  . Fetal echogenic intracardiac focus on prenatal ultrasound 08/31/2019  . Marginal insertion of umbilical cord affecting management of mother 07/19/2019  . Short interval between pregnancies affecting pregnancy, antepartum 05/30/2019  . Supervision of other normal pregnancy, antepartum 05/15/2019  . 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 LGSIL on Pap smear of cervix 10/20/2016    Assessment/Plan:  Andrea Jenkins is a 24 y.o. G2P1001 at [redacted]w[redacted]d here for IOL for marginal cord insertion.  #Labor: Vertex by BSUS. Cytotec given and FB placed with speculum. Pit/AROM as indicated. Anticipate SVD. #Pain: Per patient request #FWB: Cat I; EFW: 3500g #ID:  GBS pos; PCN #MOF: Both #MOC: Desires patch after 6 weeks #Circ:  Outpatient   Jerilynn Birkenhead, MD [facility] Family [facility] Fellow, [facility] for [facility], [facility] 12/04/2019, 9:11 AM

## 2019-12-04 NOTE — Anesthesia Preprocedure Evaluation (Signed)
Anesthesia Evaluation  Patient identified by MRN, date of birth, ID band Patient awake    Reviewed: Allergy & Precautions, NPO status , Patient's Chart, lab work & pertinent test results  Airway Mallampati: II  TM Distance: >3 FB Neck ROM: Full    Dental no notable dental hx. (+) Teeth Intact   Pulmonary asthma , former smoker,    Pulmonary exam normal breath sounds clear to auscultation       Cardiovascular Exercise Tolerance: Good negative cardio ROS Normal cardiovascular exam Rhythm:Regular Rate:Normal     Neuro/Psych negative neurological ROS  negative psych ROS   GI/Hepatic negative GI ROS, Neg liver ROS,   Endo/Other  negative endocrine ROS  Renal/GU negative Renal ROS     Musculoskeletal negative musculoskeletal ROS (+)   Abdominal   Peds  Hematology Hgb 10.2 Plt 148   Anesthesia Other Findings   Reproductive/Obstetrics (+) Pregnancy                             Anesthesia Physical Anesthesia Plan  ASA: III  Anesthesia Plan: Epidural   Post-op Pain Management:    Induction:   PONV Risk Score and Plan:   Airway Management Planned:   Additional Equipment: None  Intra-op Plan:   Post-operative Plan:   Informed Consent: I have reviewed the patients History and Physical, chart, labs and discussed the procedure including the risks, benefits and alternatives for the proposed anesthesia with the patient or authorized representative who has indicated his/her understanding and acceptance.       Plan Discussed with:   Anesthesia Plan Comments: (39 wk G2P1 w hx of asthma for LEA)        Anesthesia Quick Evaluation

## 2019-12-04 NOTE — Anesthesia Procedure Notes (Signed)
Epidural Patient location during procedure: OB Start time: 12/04/2019 6:25 PM End time: 12/04/2019 6:37 PM  Staffing Anesthesiologist: Trevor Iha, MD Performed: anesthesiologist   Preanesthetic Checklist Completed: patient identified, IV checked, site marked, risks and benefits discussed, surgical consent, monitors and equipment checked, pre-op evaluation and timeout performed  Epidural Patient position: sitting Prep: DuraPrep and site prepped and draped Patient monitoring: continuous pulse ox and blood pressure Approach: midline Location: L3-L4 Injection technique: LOR air  Needle:  Needle type: Tuohy  Needle gauge: 17 G Needle length: 9 cm and 9 Needle insertion depth: 8 cm Catheter type: closed end flexible Catheter size: 19 Gauge Catheter at skin depth: 13 cm Test dose: negative  Assessment Events: blood not aspirated, injection not painful, no injection resistance, no paresthesia and negative IV test  Additional Notes Patient identified. Risks/Benefits/Options discussed with patient including but not limited to bleeding, infection, nerve damage, paralysis, failed block, incomplete pain control, headache, blood pressure changes, nausea, vomiting, reactions to medication both or allergic, itching and postpartum back pain. Confirmed with bedside nurse the patient's most recent platelet count. Confirmed with patient that they are not currently taking any anticoagulation, have any bleeding history or any family history of bleeding disorders. Patient expressed understanding and wished to proceed. All questions were answered. Sterile technique was used throughout the entire procedure. Please see nursing notes for vital signs. Test dose was given through epidural needle and negative prior to continuing to dose epidural or start infusion. Warning signs of high block given to the patient including shortness of breath, tingling/numbness in hands, complete motor block, or any concerning  symptoms with instructions to call for help. Patient was given instructions on fall risk and not to get out of bed. All questions and concerns addressed with instructions to call with any issues. 1 Attempt (S) . Patient tolerated procedure well.

## 2019-12-05 ENCOUNTER — Encounter: Payer: Medicaid Other | Admitting: Obstetrics & Gynecology

## 2019-12-05 MED ORDER — PNEUMOCOCCAL VAC POLYVALENT 25 MCG/0.5ML IJ INJ
0.5000 mL | INJECTION | INTRAMUSCULAR | Status: AC
  Administered 2019-12-06: 11:00:00 0.5 mL via INTRAMUSCULAR
  Filled 2019-12-05: qty 0.5

## 2019-12-05 MED ORDER — SERTRALINE HCL 25 MG PO TABS
25.0000 mg | ORAL_TABLET | Freq: Every day | ORAL | Status: DC
  Administered 2019-12-05 – 2019-12-06 (×2): 25 mg via ORAL
  Filled 2019-12-05 (×4): qty 1

## 2019-12-05 NOTE — Lactation Note (Signed)
This note was copied from a baby's chart. Lactation Consultation Note: LC acknowledges accuracy of assessment of Charm Barges [employer] student.   Patient Name: Andrea Jenkins [id number] Date: 12/05/2019 Reason for consult: Initial assessment;Term   Maternal Data Has patient been taught Hand Expression?: Yes Does the patient have breastfeeding experience prior to this delivery?: Yes  Feeding Feeding Type: Breast Fed  LATCH Score Latch: Too sleepy or reluctant, no latch achieved, no sucking elicited.  Audible Swallowing: None  Type of Nipple: Everted at rest and after stimulation  Comfort (Breast/Nipple): Soft / non-tender  Hold (Positioning): Assistance needed to correctly position infant at breast and maintain latch.  LATCH Score: 5  Interventions Interventions: Breast feeding basics reviewed;Assisted with latch;Adjust position;Breast compression;Hand pump;Support pillows;DEBP;Position options;Hand express  Lactation Tools Discussed/Used Pump Review: Setup, frequency, and cleaning Initiated by:: Charm Barges, LC student Date initiated:: 12/05/19   Consult Status Consult Status: Follow-up Date: 12/06/19 Follow-up type: In-patient    Stevan Born [facility] 12/05/2019, 3:50 PM

## 2019-12-05 NOTE — Lactation Note (Signed)
This note was copied from a baby's chart. Lactation Consultation Note  Patient Name: Andrea Jenkins [id number] Date: 12/05/2019 Reason for consult: Follow-up assessment;Term Interventions: Skin to skin;Breast feeding basics reviewed;Hand pump  LC preceptor concurs with note provided by [facility] student, Sharlot Gowda, at [phone], on 2/10.  Consult Status Consult Status: Follow-up Date: 12/06/19 Follow-up type: In-patient    Walker Shadow 12/05/2019, 9:34 PM

## 2019-12-05 NOTE — Progress Notes (Addendum)
POSTPARTUM PROGRESS NOTE  Post Partum Day 1  Subjective:  Andrea Jenkins is a 24 y.o. S8P1031 s/p NSVD at [redacted]w[redacted]d.  She reports she is doing well. No acute events overnight. She denies any problems with ambulating, voiding or po intake. Denies nausea or vomiting.  Pain is well controlled.  Lochia is appropriate.  Objective: Blood pressure 114/63, pulse 63, temperature 98.1 F (36.7 C), temperature source Oral, resp. rate 18, height 5' 3.5" (1.613 m), weight 88 kg, last menstrual period 03/06/2019, SpO2 100 %, unknown if currently breastfeeding.  Physical Exam:  General: alert, cooperative and no distress Chest: no respiratory distress Heart:regular rate, distal pulses intact Abdomen: soft, nontender,  Uterine Fundus: firm, appropriately tender DVT Evaluation: No calf swelling or tenderness Extremities: no LE edema Skin: warm, dry  Recent Labs    12/04/19 0758  HGB 10.2*  HCT 31.4*    Assessment/Plan: Andrea Jenkins is a 24 y.o. [id number] s/p NSVD at [redacted]w[redacted]d   PPD#1 - Doing well  Routine postpartum care Contraception: requests patch at 6 weeeks Feeding: breast and bottle Dispo: Plan for discharge PPD#2.  BP: a few mild range BP's immediately after delivery, has since been normotensive, ctm   LOS: 1 day   Zack Seal, MD/[facility] OB Fellow  12/05/2019, 9:02 AM

## 2019-12-05 NOTE — Progress Notes (Signed)
CSW received consult for history of anxiety and depression and Edinburgh score of 13.  CSW met with MOB to offer support and complete assessment.    MOB resting in bed with infant asleep in bassinet and FOB present at bedside, when CSW entered the room. MOB welcoming of CSW visit and gave CSW verbal permission to complete assessment with FOB present. CSW introduced self and explained reason for consult to which MOB expressed understanding. CSW inquired about MOB's mental health history and MOB reported experiencing anxiety and depression starting a couple of months ago. MOB told she was likely experiencing anxiety and depression prior to this but 2 months ago is when she was diagnosed. MOB also shared a history of PPD/A following the birth of her first child that she feels never really went away. MOB reported a combination of anxiety and depression symptoms. MOB stated she met with the Behavioral Counselor through [facility] a couple of times during pregnancy and is interested in follow up from them. MOB also reported interest in being started on medications to help manage her symptoms. CSW received verbal permission to reach out to [facility] to inform them of MOB's interest. CSW provided education regarding the baby blues period vs. perinatal mood disorders, discussed treatment and gave resources for mental health follow up if concerns arise. CSW recommended self-evaluation during the postpartum time period using the New Mom Checklist from Postpartum Progress and encouraged MOB to contact a medical professional if symptoms are noted at any time. MOB did not appear to be displaying any acute mental health symptoms and denied any current SI or HI. MOB reported having good support from her mom, FOB and FOB's mom.  MOB confirmed having all essential items for infant once discharged and stated infant would be sleeping in a bassinet once home. CSW provided review of Sudden Infant Death Syndrome (SIDS) precautions and  safe sleeping habits.    CSW identifies no further need for intervention and no barriers to discharge at this time.  Elijio Miles, LCSW [facility] [employer] [phone]

## 2019-12-05 NOTE — Lactation Note (Signed)
This note was copied from a baby's chart. Lactation Consultation Note  Patient Name: Andrea Jenkins [id number] Date: 12/05/2019   Upon entering room, [name] student observed mom with baby fully dressed and laying in bed. Mom expressed some discomfort with initial latch.  Mom breast fed first baby for three month.  Fed first baby by breast and with bottle and would like to do the same with this baby.    [employer] [employer] student taught mom hand expression. Colostrum was easily expressed and droplets used for nipple soreness.  Mom had soft, compressible breasts that were everted with breast massage/ compressions. Attempted to latch baby in football, reminding mom to make sure baby is properly aligned (ear, shoulder, hip) but baby was too sleep to latch. Educated mom on hand pump and DEBP was set up in room to help maintain supply. [facility] student explained how to use and how to clean.  Mom expressed a desire to pump after showering.  [facility] student advised mom of the importance of stimulating breasts to promote supply.    Mom will attempt to latch baby often and will pump every 3 hours, until baby is more alert. Gave mom the [facility] brochure.  Informed mom of outpatient services, support group and line.  Dale Crosspointe 12/05/2019, 1:02 PM

## 2019-12-05 NOTE — Lactation Note (Signed)
This note was copied from a baby's chart. Lactation Consultation Note Nurse tech getting baby wt. Mom resting. Looked at me then turned her head away. LC spoke softly telling mom I was w/Lactation and would be back to see her later today. Mom never responded.  Patient Name: Andrea Jenkins PNPYY'F Date: 12/05/2019     Maternal Data    Feeding    LATCH Score                   Interventions    Lactation Tools Discussed/Used     Consult Status      Andrea Jenkins 12/05/2019, 5:32 AM

## 2019-12-05 NOTE — Progress Notes (Signed)
POSTPARTUM PROGRESS NOTE  Subjective: Andrea Jenkins is a 24 y.o. [id number] s/p SVD at [redacted]w[redacted]d.  She reports she is doing well. No acute events overnight. She denies any problems with ambulating, voiding or po intake. Denies nausea or vomiting. She has not passed flatus. Pain is moderately controlled.  Lochia is present and greater then period.  Objective: Blood pressure (!) 104/56, pulse 72, temperature 98.7 F (37.1 C), temperature source Oral, resp. rate 16, height 5' 3.5" (1.613 m), weight 88 kg, last menstrual period 03/06/2019, SpO2 100 %, unknown if currently breastfeeding.  Physical Exam:  General: alert, cooperative and no distress CV: RRR, with a 2/6 Systolic murmur Chest: no respiratory distress.  Abdomen: soft, non-tender  Uterine Fundus: firm, appropriately tender Extremities: No calf swelling or tenderness  no edema  Recent Labs    12/04/19 0758  HGB 10.2*  HCT 31.4*  Platelets 148   Assessment/Plan: Andrea Jenkins is a 24 y.o. [id number] s/p SVD at [redacted]w[redacted]d.   Routine Postpartum Care: Doing well, pain well-controlled.  -- Continue routine care, lactation support  -- Contraception: Patch at 6 weeks -- Feeding: Breast Feeding  Dispo: Plan for discharge. Repeat CBC today, eligible for discharge tomorrow if she continues stable and there is no drop in Hg or plts.   Marlowe Alt, DO OB/GYN Fellow, [facility] for [facility]

## 2019-12-06 DIAGNOSIS — F419 Anxiety disorder, unspecified: Secondary | ICD-10-CM

## 2019-12-06 LAB — BIRTH TISSUE RECOVERY COLLECTION (PLACENTA DONATION)

## 2019-12-06 MED ORDER — ACETAMINOPHEN 325 MG PO TABS: 650.0000 mg | ORAL_TABLET | ORAL | 0 refills | Status: DC | PRN

## 2019-12-06 MED ORDER — SERTRALINE HCL 25 MG PO TABS: 25.0000 mg | ORAL_TABLET | Freq: Every day | ORAL | 2 refills | Status: DC

## 2019-12-06 MED ORDER — POLYETHYLENE GLYCOL 3350 17 GM/SCOOP PO POWD: 1.0000 | Freq: Once | ORAL | 0 refills | Status: AC

## 2019-12-06 MED ORDER — IBUPROFEN 600 MG PO TABS: 600.0000 mg | ORAL_TABLET | Freq: Four times a day (QID) | ORAL | 0 refills | Status: DC

## 2019-12-06 NOTE — Anesthesia Postprocedure Evaluation (Signed)
Anesthesia Post Note  Patient: Andrea Jenkins  Procedure(s) Performed: AN AD HOC LABOR EPIDURAL     Patient location during evaluation: Mother Baby Anesthesia Type: Epidural Level of consciousness: awake and alert Pain management: pain level controlled Vital Signs Assessment: post-procedure vital signs reviewed and stable Respiratory status: spontaneous breathing, nonlabored ventilation and respiratory function stable Cardiovascular status: stable Postop Assessment: no headache, no backache and epidural receding Anesthetic complications: no    Last Vitals:  Vitals:   12/05/19 2213 12/06/19 0537  BP: (!) 107/45 100/60  Pulse: 63 61  Resp: 16 18  Temp: 36.9 C 36.7 C  SpO2: 100% 100%    Last Pain:  Vitals:   12/06/19 0537  TempSrc: Oral  PainSc: 0-No pain   Pain Goal:                   [name]

## 2019-12-06 NOTE — Discharge Instructions (Signed)

## 2019-12-06 NOTE — Lactation Note (Signed)
This note was copied from a baby's chart. Lactation Consultation Note Baby 32 hrs old. Mom is BF/Formula feeding. Mom is giving Gerber 30 ml. Then trying to put on the breast. Encouraged mom to BF first then supplement if needed. Mom sitting in bed holding baby in her lap football position w/pillows for support latching baby off and on. LC explained he's probably full of the formula that is why baby isn't interested much in BF at this time.  Mom stated she has a pump at home. Discussed engorgement, management, milk storage, breast massage, supply and demand. Reminded mom of LC OP services and support groups. Newborn feeding habits discussed. Mom encouraged to feed baby 8-12 times/24 hours and with feeding cues.  Mom states she is excited to go home today feels good about feedings.  Patient Name: Boy Emberlyn Burlison [id number] Date: 12/06/2019 Reason for consult: Follow-up assessment;Term   Maternal Data Has patient been taught Hand Expression?: Yes  Feeding Feeding Type: Formula  LATCH Score Latch: Repeated attempts needed to sustain latch, nipple held in mouth throughout feeding, stimulation needed to elicit sucking reflex.  Audible Swallowing: None  Type of Nipple: Everted at rest and after stimulation  Comfort (Breast/Nipple): Soft / non-tender  Hold (Positioning): Assistance needed to correctly position infant at breast and maintain latch.  LATCH Score: 6  Interventions Interventions: Breast feeding basics reviewed;Support pillows;Adjust position;Breast compression;Breast massage;Assisted with latch  Lactation Tools Discussed/Used     Consult Status Consult Status: Complete Date: 12/06/19    Charyl Dancer 12/06/2019, 5:08 AM

## 2019-12-11 ENCOUNTER — Telehealth: Payer: Medicaid Other | Admitting: Licensed Clinical Social Worker

## 2019-12-12 ENCOUNTER — Encounter: Payer: Medicaid Other | Admitting: Obstetrics & Gynecology

## 2019-12-14 ENCOUNTER — Telehealth: Payer: Self-pay | Admitting: Clinical

## 2019-12-14 ENCOUNTER — Ambulatory Visit: Payer: Medicaid Other

## 2019-12-14 NOTE — Telephone Encounter (Signed)
Integrated Behavioral Health Medication Management Phone Note  MRN: [id number] NAME: Christella App Skaff  Time Call Initiated: 10:58 Time Call Completed: 11:01 Total Call Time: 3  Current Medications:  Outpatient Medications Prior to Visit  Medication Sig Dispense Refill  . acetaminophen (TYLENOL) 325 MG tablet Take 2 tablets (650 mg total) by mouth every 4 (four) hours as needed (for pain scale < 4). 30 tablet 0  . cyclobenzaprine (FLEXERIL) 5 MG tablet Take 1 tablet (5 mg total) by mouth at bedtime as needed for muscle spasms. (Patient not taking: Reported on 12/04/2019) 15 tablet 0  . ibuprofen (ADVIL) 600 MG tablet Take 1 tablet (600 mg total) by mouth every 6 (six) hours. 30 tablet 0  . Prenat-Fe Poly-Methfol-FA-DHA (VITAFOL ULTRA) 29-0.6-0.4-200 MG CAPS Take 1 tablet by mouth daily. (Patient not taking: Reported on 12/04/2019) 30 capsule 12  . sertraline (ZOLOFT) 25 MG tablet Take 1 tablet (25 mg total) by mouth daily. 30 tablet 2   No facility-administered medications prior to visit.    Patient has been able to get all medications filled as prescribed: Yes  Patient is currently taking all medications as prescribed: Yes  Patient reports experiencing side effects: No  Patient describes feeling this way on medications: No different  Additional patient concerns: Milk supply going down "a little", but does not wish to schedule with Lactation Consultant at this time  Patient advised to schedule appointment with provider for evaluation of medication side effects or additional concerns: Yes- March 5, 10:45am with [facility]   Tilden Broz C [place]anga Gloor, LCSW

## 2020-01-11 ENCOUNTER — Ambulatory Visit: Payer: Medicaid Other | Admitting: Licensed Clinical Social Worker

## 2020-01-11 ENCOUNTER — Ambulatory Visit: Payer: Medicaid Other

## 2020-01-15 ENCOUNTER — Telehealth (INDEPENDENT_AMBULATORY_CARE_PROVIDER_SITE_OTHER): Payer: Medicaid Other | Admitting: Certified Nurse Midwife

## 2020-01-15 ENCOUNTER — Encounter: Payer: Self-pay | Admitting: Certified Nurse Midwife

## 2020-01-15 DIAGNOSIS — F53 Postpartum depression: Secondary | ICD-10-CM

## 2020-01-15 DIAGNOSIS — O99345 Other mental disorders complicating the puerperium: Secondary | ICD-10-CM

## 2020-01-15 DIAGNOSIS — Z3009 Encounter for other general counseling and advice on contraception: Secondary | ICD-10-CM

## 2020-01-15 MED ORDER — SERTRALINE HCL 50 MG PO TABS: 50.0000 mg | ORAL_TABLET | Freq: Every day | ORAL | 2 refills | Status: DC

## 2020-01-15 NOTE — Progress Notes (Signed)
    POSTPARTUM VIRTUAL VISIT ENCOUNTER NOTE  Provider location: [facility] for [facility]   I connected with Andrea Jenkins on 01/15/20 at  1:00 PM EDT by MyChart Video Encounter at home and verified that I am speaking with the correct person using two identifiers.

## 2020-01-15 NOTE — Progress Notes (Signed)
POSTPARTUM VIRTUAL VISIT ENCOUNTER NOTE  Provider location: [facility] for [facility]   I connected with Kathryne Hitch Uselman on 01/15/20 at  1:19 PM EDT by MyChart Video Encounter at home and verified that I am speaking with the correct person using two identifiers.    I discussed the limitations, risks, security and privacy concerns of performing an evaluation and management service virtually and the availability of in person appointments. I also discussed with the patient that there may be a patient responsible charge related to this service. The patient expressed understanding and agreed to proceed.  Chief Complaint: Postpartum Visit  History of Present Illness:   AVILYN VIRTUE is a 24 y.o. G57P2002 female being evaluated for postpartum followup.  She is 1 month postpartum following a normal spontaneous vaginal delivery at [redacted]w[redacted]d gestational weeks.  I have fully reviewed the prenatal and intrapartum course. Pregnancy was complicated by a marginal cord insertion.  Postpartum course has been unremarkable. Baby is doing well. Baby is feeding by bottle - Octavia Heir. Bleeding no bleeding. Bowel function is normal. Bladder function is normal. Patient is sexually active. Unprotected IC on Saturday. Contraception method is none. She wants the patch. Postpartum depression screening: positive. EPDS = 16  The following portions of the patient's history were reviewed and updated as appropriate: allergies, current medications, past medical history, past social history and problem list.  Review of Systems: Pertinent items noted in HPI and remainder of comprehensive ROS otherwise negative.  Patient Active Problem List   Diagnosis Date Noted  . Anxiety   . GBS (group B Streptococcus carrier), +RV culture, currently pregnant 11/19/2019  . Alpha thalassemia silent carrier 10/24/2019  . Fetal echogenic intracardiac focus on prenatal ultrasound 08/31/2019  . Marginal insertion of  umbilical cord affecting management of mother 07/19/2019  . Supervision of other normal pregnancy, antepartum 05/15/2019  . 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 LGSIL on Pap smear of cervix 10/20/2016    Medications Labrenda L. Hirschmann had no medications administered during this visit. Current Outpatient Medications  Medication Sig Dispense Refill  . acetaminophen (TYLENOL) 325 MG tablet Take 2 tablets (650 mg total) by mouth every 4 (four) hours as needed (for pain scale < 4). (Patient not taking: Reported on 01/15/2020) 30 tablet 0  . cyclobenzaprine (FLEXERIL) 5 MG tablet Take 1 tablet (5 mg total) by mouth at bedtime as needed for muscle spasms. (Patient not taking: Reported on 12/04/2019) 15 tablet 0  . ibuprofen (ADVIL) 600 MG tablet Take 1 tablet (600 mg total) by mouth every 6 (six) hours. (Patient not taking: Reported on 01/15/2020) 30 tablet 0  . Prenat-Fe Poly-Methfol-FA-DHA (VITAFOL ULTRA) 29-0.6-0.4-200 MG CAPS Take 1 tablet by mouth daily. (Patient not taking: Reported on 12/04/2019) 30 capsule 12  . sertraline (ZOLOFT) 50 MG tablet Take 1 tablet (50 mg total) by mouth daily. 30 tablet 2   No current facility-administered medications for this visit.    Allergies Peach [prunus persica]  Physical Exam:  There were no vitals taken for this visit.  General:  Alert, oriented and cooperative. Patient is in no acute distress.  Mental Status: Normal mood and affect. Normal behavior. Normal judgment and thought content.   Respiratory: Normal respiratory effort noted, no problems with respiration noted  Rest of physical exam deferred due to type of encounter  PP Depression Screening:   Edinburgh Postnatal Depression Scale Screening Tool 01/15/2020 12/05/2019 02/15/2018 01/18/2018  I have been able to laugh and see  the funny side of things. 0 0 0 0  I have looked forward with enjoyment to things. 0 2 0 0  I have blamed myself unnecessarily when things went wrong. 2 2 0 0  I have been  anxious or worried for no good reason. 2 2 0 0  I have felt scared or panicky for no good reason. 1 1 0 0  Things have been getting on top of me. 2 1 0 0  I have been so unhappy that I have had difficulty sleeping. 3 2 0 0  I have felt sad or miserable. 3 2 1  0  I have been so unhappy that I have been crying. 3 1 1  0  The thought of harming myself has occurred to me. 0 0 0 0  Edinburgh Postnatal Depression Scale Total 16 13 2  0     Assessment:Patient is a 24 y.o. [id number] who is 6 weeks postpartum from a normal spontaneous vaginal delivery.  She is doing okay- reports zoloft is not helping.    Plan: 1. Postpartum care and examination  2. Postpartum depression - patient currently on 25mg  of Zoloft, patient reports it is not helping  - EPDS 16 today and was 23 after delivery  - Educated and discussed increasing dosage to 50 then scheduling mood check in office in 2 weeks, patient verbalizes understanding  - Educated and discussed speaking with Seth Bake, social worker/IBH, referral placed - sertraline (ZOLOFT) 50 MG tablet; Take 1 tablet (50 mg total) by mouth daily.  Dispense: 30 tablet; Refill: 2 - Ambulatory referral to [facility]  3. Birth control counseling - Educated and discussed birth control options in detail, request patch  - Patient reports unprotected IC over this past weekend  - Discussed with patient need to UPT in office in 2 weeks since out of 72 hour window  - encouraged to abstain from IC until after that appointment   Essential components of care per ACOG recommendations:  1.  Mood and well being: Patient with positive depression screening today. Reviewed local resources for support.   2. Infant care and feeding:  -Patient currently breastmilk feeding? No  -Social determinants of health (SDOH) reviewed in EPIC. No concerns  3. Sexuality, contraception and birth spacing - Patient does not want a pregnancy in the next year.  Desired family size is  unknown at this time.  - Reviewed forms of contraception in tiered fashion. Patient desired Allean Found today- UPT needed in 2 weeks for initiation.   - Discussed birth spacing of 18 months  4. Sleep and fatigue -Encouraged family/partner/community support of 4 hrs of uninterrupted sleep to help with mood and fatigue  5. Physical Recovery  - Discussed patients delivery and complications - Patient had a periurethral laceration bilaterally, perineal healing reviewed. Patient expressed understanding - Patient has urinary incontinence? No - Patient is safe to resume physical activity  6.  Health Maintenance - Last pap smear done 01/2018 and was normal with negative HPV.  I discussed the assessment and treatment plan with the patient. The patient was provided an opportunity to ask questions and all were answered. The patient agreed with the plan and demonstrated an understanding of the instructions.   The patient was advised to call back or seek an in-person evaluation/go to the ED for any concerning postpartum symptoms.  I provided 12 minutes of face-to-face time during this encounter.   Lajean Manes, [place] for [employer], [place]

## 2020-01-20 ENCOUNTER — Encounter: Payer: Self-pay | Admitting: Certified Nurse Midwife

## 2020-01-21 ENCOUNTER — Encounter: Payer: Medicaid Other | Admitting: Licensed Clinical Social Worker

## 2020-01-21 ENCOUNTER — Other Ambulatory Visit: Payer: Self-pay

## 2020-01-21 DIAGNOSIS — B3731 Acute candidiasis of vulva and vagina: Secondary | ICD-10-CM

## 2020-01-21 DIAGNOSIS — B373 Candidiasis of vulva and vagina: Secondary | ICD-10-CM

## 2020-01-21 MED ORDER — FLUCONAZOLE 150 MG PO TABS: 150.0000 mg | ORAL_TABLET | Freq: Once | ORAL | 0 refills | Status: AC

## 2020-01-21 NOTE — Progress Notes (Signed)
Diflucan rx sent per protocol for yeast infection symptoms, pt made aware.

## 2020-01-22 ENCOUNTER — Ambulatory Visit (INDEPENDENT_AMBULATORY_CARE_PROVIDER_SITE_OTHER): Payer: Medicaid Other | Admitting: Licensed Clinical Social Worker

## 2020-01-22 DIAGNOSIS — F4321 Adjustment disorder with depressed mood: Secondary | ICD-10-CM

## 2020-01-22 NOTE — BH Specialist Note (Signed)
Integrated Behavioral Health Initial Visit  MRN: [id number] Name: Andrea Jenkins  Number of Integrated Behavioral Health Clinician visits:: 1 Session Start time:11:00amSession End time: 11:10am Total time: 10 mins  Type of Service: Integrated Behavioral Health- Individual Interpretor:no  Interpretor Name and Language: None   Warm Hand Off Completed.       SUBJECTIVE: Andrea Jenkins is a 24 y.o. female accompanied by n/a Patient was referred by Andrea Jenkins  for depression  Patient reports the following symptoms/concerns: isolation  Duration of problem: approx one month  ; Severity of problem: mild   OBJECTIVE: Mood and affect are normal and congruent  Risk of harm to self or others: no risk of harm to self or others  LIFE CONTEXT: Family and Social: Lives with two children in [place]  School/Work: n/a Self-Care: None  Life Changes: Newborn   GOALS ADDRESSED: Patient will: 1. Reduce symptoms of: isolation and sadness  2. Increase knowledge and/or ability of:    3. Demonstrate ability to: self manage symptoms   INTERVENTIONS: Interventions utilized: supportive counseling   Standardized Assessments completed: completed 01/15/2020  ASSESSMENT: Patient currently experiencing adjustment disorder with depressed mood. Andrea Jenkins reports crying and depressed mood due to inadequate parental support of father of baby.    Patient may benefit from integrated behavioral health   PLAN: 1. Follow up with behavioral health clinician on : 2 weeks  2. Behavioral recommendations: continue taking prescribed medication zoloft as directed by provider, increase social interaction, develop effective co parenting plan  3. Referral(s): none  4. "From scale of 1-10, how likely are you to follow plan?":  Andrea Saxon, LCSW

## 2020-01-30 ENCOUNTER — Other Ambulatory Visit (HOSPITAL_COMMUNITY)
Admission: RE | Admit: 2020-01-30 | Discharge: 2020-01-30 | Disposition: A | Discharge status: Home / Self Care | Payer: Medicaid Other | Source: Ambulatory Visit | Attending: Certified Nurse Midwife | Admitting: Certified Nurse Midwife

## 2020-01-30 ENCOUNTER — Other Ambulatory Visit: Payer: Self-pay

## 2020-01-30 ENCOUNTER — Encounter: Payer: Self-pay | Admitting: Certified Nurse Midwife

## 2020-01-30 ENCOUNTER — Ambulatory Visit (INDEPENDENT_AMBULATORY_CARE_PROVIDER_SITE_OTHER): Payer: Medicaid Other | Admitting: Certified Nurse Midwife

## 2020-01-30 VITALS — BP 117/79 | HR 76 | Ht 63.5 in | Wt 183.0 lb

## 2020-01-30 DIAGNOSIS — F53 Postpartum depression: Secondary | ICD-10-CM

## 2020-01-30 DIAGNOSIS — Z3202 Encounter for pregnancy test, result negative: Secondary | ICD-10-CM | POA: Diagnosis not present

## 2020-01-30 DIAGNOSIS — Z30016 Encounter for initial prescription of transdermal patch hormonal contraceptive device: Secondary | ICD-10-CM | POA: Diagnosis not present

## 2020-01-30 DIAGNOSIS — Z113 Encounter for screening for infections with a predominantly sexual mode of transmission: Secondary | ICD-10-CM | POA: Insufficient documentation

## 2020-01-30 DIAGNOSIS — Z7251 High risk heterosexual behavior: Secondary | ICD-10-CM

## 2020-01-30 DIAGNOSIS — O99345 Other mental disorders complicating the puerperium: Secondary | ICD-10-CM

## 2020-01-30 LAB — POCT URINE PREGNANCY: Preg Test, Ur: NEGATIVE

## 2020-01-30 MED ORDER — XULANE 150-35 MCG/24HR TD PTWK: 1.0000 | MEDICATED_PATCH | TRANSDERMAL | 12 refills | Status: DC

## 2020-01-30 MED ORDER — BUPROPION HCL 75 MG PO TABS: 75.0000 mg | ORAL_TABLET | Freq: Every day | ORAL | 2 refills | Status: DC

## 2020-01-30 MED ORDER — LEVONORGESTREL 1.5 MG PO TABS: 1.5000 mg | ORAL_TABLET | Freq: Once | ORAL | 0 refills | Status: AC

## 2020-01-30 NOTE — Progress Notes (Signed)
Pt is in the office for pp follow up after visit on 01-15-20 Pt states she is not breastfeeding anymore Pt desires BC patch Edinburgh = 23

## 2020-01-30 NOTE — Progress Notes (Signed)
History:  [name] is a 24 y.o. [id number] who presents to clinic today for PP mood check. Patient delivered on 12/04/19. Patient has been battling depression and anxiety since delivery and was initially started on 25mg  of Zoloft, increased to 50mg  on 3/23.   Patient reports little to no difference on medication. Patient reports that she is crying, does not want to get out of bed, getting little sleep, anxious. Patient denies SI/HI at this time - patient reports she did have SI 2-3 days ago but denies having a plan.    Patient reports that she is not breastfeeding and would like to be on birth control, patient request patch for birth control. Patient also wants a self swab for STD testing today  reports that she had a yeast infection that was treated but wants to be checked because she believes it never went away completely.   The following portions of the patient's history were reviewed and updated as appropriate: allergies, current medications, family history, past medical history, social history, past surgical history and problem list.  Review of Systems:  Review of Systems  Constitutional: Negative.   Respiratory: Negative.   Cardiovascular: Negative.   Gastrointestinal: Negative.   Genitourinary: Negative.   Neurological: Negative.   Psychiatric/Behavioral: Positive for depression. Negative for substance abuse and suicidal ideas. The patient is nervous/anxious and has insomnia.       Objective:  Physical Exam BP 117/79   Pulse 76   Ht 5' 3.5" (1.613 m)   Wt 183 lb (83 kg)   Breastfeeding No   BMI 31.91 kg/m  Physical Exam Vitals and nursing note reviewed.  Cardiovascular:     Rate and Rhythm: Normal rate and regular rhythm.  Pulmonary:     Effort: Pulmonary effort is normal.     Breath sounds: Normal breath sounds.  Abdominal:     General: There is no distension.     Palpations: Abdomen is soft.     Tenderness: There is no abdominal tenderness. There is no  guarding.  Skin:    General: Skin is warm and dry.  Neurological:     Mental Status: She is alert and oriented to person, place, and time.  Psychiatric:        Behavior: Behavior normal.        Thought Content: Thought content normal.        Judgment: Judgment normal.     Labs and Imaging Results for orders placed or performed in visit on 01/30/20 (from the past 24 hour(s))  POCT urine pregnancy     Status: None   Collection Time: 01/30/20 11:27 AM  Result Value Ref Range   Preg Test, Ur Negative Negative    Assessment & Plan:  1. Postpartum depression - Educated and discussed switching patient to different medication that will effect different receptor since no improvement with SSRIs  - Discussed with patient that with switch, she can feel more depressed over the first week but then should improve  - Encouraged patient to call office or seek immediate care if she has SI/HI especially with a plan, patient verbalizes understanding  - buPROPion (WELLBUTRIN) 75 MG tablet; Take 1 tablet (75 mg total) by mouth daily.  Dispense: 30 tablet; Refill: 2  2. Unprotected sexual intercourse - Patient reports she had not had IC since last appointment until this morning  - Patient does not want to be pregnant and wants to get on [facility]  - Plan B sent to the pharmacy  for the patient to pick up, encouraged to take today  - levonorgestrel (PLAN B 1-STEP) 1.5 MG tablet; Take 1 tablet (1.5 mg total) by mouth once for 1 dose.  Dispense: 1 tablet; Refill: 0  3. Encounter for initial prescription of transdermal patch hormonal contraceptive device - Educated and discussed use of transdermal patch and answered patient's questions  - POCT urine pregnancy - norelgestromin-ethinyl estradiol Marilu Favre) 150-35 MCG/24HR transdermal patch; Place 1 patch onto the skin once a week.  Dispense: 3 patch; Refill: 12  4. Screening for STDs (sexually transmitted diseases) - Patient requests to self swab for STDs and yeast  infection  - Cervicovaginal ancillary only( [place])   Lajean Manes, CNM 01/30/2020 2:25 PM

## 2020-01-30 NOTE — Patient Instructions (Addendum)
Perinatal Depression When a woman feels excessive sadness, anger, or anxiety during pregnancy or during the first 12 months after she gives birth, she has a condition called perinatal depression. Depression can interfere with work, school, relationships, and other everyday activities. If it is not managed properly, it can also cause problems in the mother and her baby. Sometimes, perinatal depression is left untreated because symptoms are thought to be normal mood swings during and right after pregnancy. If you have symptoms of depression, it is important to talk with your health care provider. What are the causes? The exact cause of this condition is not known. Hormonal changes during and after pregnancy may play a role in causing perinatal depression. What increases the risk? You are more likely to develop this condition if:  You have a personal or family history of depression, anxiety, or mood disorders.  You experience a stressful life event during pregnancy, such as the death of a loved one.  You have a lot of regular life stress.  You do not have support from family members or loved ones, or you are in an abusive relationship. What are the signs or symptoms? Symptoms of this condition include:  Feeling sad or hopeless.  Feelings of guilt.  Feeling irritable or overwhelmed.  Changes in your appetite.  Lack of energy or motivation.  Sleep problems.  Difficulty concentrating or completing tasks.  Loss of interest in hobbies or relationships.  Headaches or stomach problems that do not go away. How is this diagnosed? This condition is diagnosed based on a physical exam and mental evaluation. In some cases, your health care provider may use a depression screening tool. These tools include a list of questions that can help a health care provider diagnose depression. Your health care provider may refer you to a mental health expert who specializes in depression. How is this  treated? This condition may be treated with:  Medicines. Your health care provider will only give you medicines that have been proven safe for pregnancy and breastfeeding.  Talk therapy with a mental health professional to help change your patterns of thinking (cognitive behavioral therapy).  Support groups.  Brain stimulation or light therapies.  Stress reduction therapies, such as mindfulness. Follow these instructions at home: Lifestyle  Do not use any products that contain nicotine or tobacco, such as cigarettes and e-cigarettes. If you need help quitting, ask your health care provider.  Do not use alcohol when you are pregnant. After your baby is born, limit alcohol intake to no more than 1 drink a day. One drink equals 12 oz of beer, 5 oz of wine, or 1 oz of hard liquor.  Consider joining a support group for new mothers. Ask your health care provider for recommendations.  Take good care of yourself. Make sure you: ? Get plenty of sleep. If you are having trouble sleeping, talk with your health care provider. ? Eat a healthy diet. This includes plenty of fruits and vegetables, whole grains, and lean proteins. ? Exercise regularly, as told by your health care provider. Ask your health care provider what exercises are safe for you. General instructions  Take over-the-counter and prescription medicines only as told by your health care provider.  Talk with your partner or family members about your feelings during pregnancy. Share any concerns or anxieties that you may have.  Ask for help with tasks or chores when you need it. Ask friends and family members to provide meals, watch your children, or help with   cleaning.  Keep all follow-up visits as told by your health care provider. This is important. Contact a health care provider if:  You (or people close to you) notice that you have any symptoms of depression.  You have depression and your symptoms get worse.  You  experience side effects from medicines, such as nausea or sleep problems. Get help right away if:  You feel like hurting yourself, your baby, or someone else. If you ever feel like you may hurt yourself or others, or have thoughts about taking your own life, get help right away. You can go to your nearest emergency department or call:  Your local emergency services (911 in the [place]  A suicide crisis helpline, such as [facility] at 5163364348. This is open 24 hours a day. Summary  Perinatal depression is when a woman feels excessive sadness, anger, or anxiety during pregnancy or during the first 12 months after she gives birth.  If perinatal depression is not treated, it can lead to health problems for the mother and her baby.  This condition is treated with medicines, talk therapy, stress reduction therapies, or a combination of two or more treatments.  Talk with your partner or family members about your feelings. Do not be afraid to ask for help. This information is not intended to replace advice given to you by your health care provider. Make sure you discuss any questions you have with your health care provider. Document Revised: 03/28/2019 Document Reviewed: 12/08/2016 Elsevier Patient Education  Balm.  Bupropion tablets (Depression/Mood Disorders) What is this medicine? BUPROPION (byoo PROE pee on) is used to treat depression. This medicine may be used for other purposes; ask your health care provider or pharmacist if you have questions. COMMON BRAND NAME(S): Wellbutrin What should I tell my health care provider before I take this medicine? They need to know if you have any of these conditions:  an eating disorder, such as anorexia or bulimia  bipolar disorder or psychosis  diabetes or high blood sugar, treated with medication  glaucoma  heart disease, previous heart attack, or irregular heart beat  head injury or brain  tumor  high blood pressure  kidney or liver disease  seizures  suicidal thoughts or a previous suicide attempt  Tourette's syndrome  weight loss  an unusual or allergic reaction to bupropion, other medicines, foods, dyes, or preservatives  breast-feeding  pregnant or trying to become pregnant How should I use this medicine? Take this medicine by mouth with a glass of water. Follow the directions on the prescription label. You can take it with or without food. If it upsets your stomach, take it with food. Take your medicine at regular intervals. Do not take your medicine more often than directed. Do not stop taking this medicine suddenly except upon the advice of your doctor. Stopping this medicine too quickly may cause serious side effects or your condition may worsen. A special MedGuide will be given to you by the pharmacist with each prescription and refill. Be sure to read this information carefully each time. Talk to your pediatrician regarding the use of this medicine in children. Special care may be needed. Overdosage: If you think you have taken too much of this medicine contact a poison control center or emergency room at once. NOTE: This medicine is only for you. Do not share this medicine with others. What if I miss a dose? If you miss a dose, take it as soon as you  can. If it is less than four hours to your next dose, take only that dose and skip the missed dose. Do not take double or extra doses. What may interact with this medicine? Do not take this medicine with any of the following medications:  linezolid  MAOIs like Azilect, Carbex, Eldepryl, Marplan, Nardil, and Parnate  methylene blue (injected into a vein)  other medicines that contain bupropion like Zyban This medicine may also interact with the following medications:  alcohol  certain medicines for anxiety or sleep  certain medicines for blood pressure like metoprolol, propranolol  certain medicines  for depression or psychotic disturbances  certain medicines for HIV or AIDS like efavirenz, lopinavir, nelfinavir, ritonavir  certain medicines for irregular heart beat like propafenone, flecainide  certain medicines for Parkinson's disease like amantadine, levodopa  certain medicines for seizures like carbamazepine, phenytoin, phenobarbital  cimetidine  clopidogrel  cyclophosphamide  digoxin  furazolidone  isoniazid  nicotine  orphenadrine  procarbazine  steroid medicines like prednisone or cortisone  stimulant medicines for attention disorders, weight loss, or to stay awake  tamoxifen  theophylline  thiotepa  ticlopidine  tramadol  warfarin This list may not describe all possible interactions. Give your health care provider a list of all the medicines, herbs, non-prescription drugs, or dietary supplements you use. Also tell them if you smoke, drink alcohol, or use illegal drugs. Some items may interact with your medicine. What should I watch for while using this medicine? Tell your doctor if your symptoms do not get better or if they get worse. Visit your doctor or healthcare provider for regular checks on your progress. Because it may take several weeks to see the full effects of this medicine, it is important to continue your treatment as prescribed by your doctor. This medicine may cause serious skin reactions. They can happen weeks to months after starting the medicine. Contact your healthcare provider right away if you notice fevers or flu-like symptoms with a rash. The rash may be red or purple and then turn into blisters or peeling of the skin. Or, you might notice a red rash with swelling of the face, lips or lymph nodes in your neck or under your arms. Patients and their families should watch out for new or worsening thoughts of suicide or depression. Also watch out for sudden changes in feelings such as feeling anxious, agitated, panicky, irritable, hostile,  aggressive, impulsive, severely restless, overly excited and hyperactive, or not being able to sleep. If this happens, especially at the beginning of treatment or after a change in dose, call your healthcare provider. Avoid alcoholic drinks while taking this medicine. Drinking excessive alcoholic beverages, using sleeping or anxiety medicines, or quickly stopping the use of these agents while taking this medicine may increase your risk for a seizure. Do not drive or use heavy machinery until you know how this medicine affects you. This medicine can impair your ability to perform these tasks. Do not take this medicine close to bedtime. It may prevent you from sleeping. Your mouth may get dry. Chewing sugarless gum or sucking hard candy, and drinking plenty of water may help. Contact your doctor if the problem does not go away or is severe. What side effects may I notice from receiving this medicine? Side effects that you should report to your doctor or health care professional as soon as possible:  allergic reactions like skin rash, itching or hives, swelling of the face, lips, or tongue  breathing problems  changes in vision  confusion  elevated mood, decreased need for sleep, racing thoughts, impulsive behavior  fast or irregular heartbeat  hallucinations, loss of contact with reality  increased blood pressure  rash, fever, and swollen lymph nodes  redness, blistering, peeling, or loosening of the skin, including inside the mouth  seizures  suicidal thoughts or other mood changes  unusually weak or tired  vomiting Side effects that usually do not require medical attention (report to your doctor or health care professional if they continue or are bothersome):  constipation  headache  loss of appetite  nausea  tremors  weight loss This list may not describe all possible side effects. Call your doctor for medical advice about side effects. You may report side effects to  FDA at 1-800-FDA-1088. Where should I keep my medicine? Keep out of the reach of children. Store at room temperature between 20 and 25 degrees C (68 and 77 degrees F), away from direct sunlight and moisture. Keep tightly closed. Throw away any unused medicine after the expiration date. NOTE: This sheet is a summary. It may not cover all possible information. If you have questions about this medicine, talk to your doctor, pharmacist, or health care provider.  2020 Elsevier/Gold Standard (2019-01-04 14:02:47)

## 2020-01-31 LAB — CERVICOVAGINAL ANCILLARY ONLY
Bacterial Vaginitis (gardnerella): NEGATIVE
Candida Glabrata: NEGATIVE
Candida Vaginitis: NEGATIVE
Chlamydia: NEGATIVE
Comment: NEGATIVE
Comment: NEGATIVE
Comment: NEGATIVE
Comment: NEGATIVE
Comment: NEGATIVE
Comment: NORMAL
Neisseria Gonorrhea: NEGATIVE
Trichomonas: NEGATIVE

## 2020-02-05 ENCOUNTER — Encounter: Payer: Medicaid Other | Admitting: Licensed Clinical Social Worker

## 2020-02-20 ENCOUNTER — Ambulatory Visit: Payer: Medicaid Other | Admitting: Certified Nurse Midwife

## 2020-03-13 ENCOUNTER — Other Ambulatory Visit: Payer: Self-pay

## 2020-03-13 MED ORDER — METRONIDAZOLE 500 MG PO TABS: 500.0000 mg | ORAL_TABLET | Freq: Two times a day (BID) | ORAL | 0 refills | Status: DC

## 2020-03-13 MED ORDER — FLUCONAZOLE 150 MG PO TABS: 150.0000 mg | ORAL_TABLET | Freq: Once | ORAL | 0 refills | Status: AC

## 2020-03-27 ENCOUNTER — Telehealth: Payer: Self-pay | Admitting: Clinical

## 2020-03-27 NOTE — Telephone Encounter (Signed)
Follow up with pt: Pt is informed of move to new location and that new numbers/address will be sent to her via MyChart message. Pt declines follow up visit with [facility] at this time, as current medication is working well, and she feels "so much better, busy working", but agrees to [employer] at [phone] or the front office at [phone] if she would like a visit in the future.

## 2020-03-30 DIAGNOSIS — Z7251 High risk heterosexual behavior: Secondary | ICD-10-CM | POA: Diagnosis not present

## 2020-04-14 DIAGNOSIS — B373 Candidiasis of vulva and vagina: Secondary | ICD-10-CM | POA: Diagnosis not present

## 2020-04-15 ENCOUNTER — Other Ambulatory Visit: Payer: Self-pay

## 2020-04-15 MED ORDER — METRONIDAZOLE 500 MG PO TABS: 500.0000 mg | ORAL_TABLET | Freq: Two times a day (BID) | ORAL | 0 refills | Status: DC

## 2020-04-21 ENCOUNTER — Emergency Department (HOSPITAL_COMMUNITY): Payer: Medicaid Other

## 2020-04-21 ENCOUNTER — Encounter (HOSPITAL_COMMUNITY): Payer: Self-pay | Admitting: Emergency Medicine

## 2020-04-21 ENCOUNTER — Other Ambulatory Visit: Payer: Self-pay

## 2020-04-21 ENCOUNTER — Emergency Department (HOSPITAL_COMMUNITY)
Admission: EM | Admit: 2020-04-21 | Discharge: 2020-04-21 | Disposition: A | Discharge status: Home / Self Care | Payer: Medicaid Other | Attending: Emergency Medicine | Admitting: Emergency Medicine

## 2020-04-21 DIAGNOSIS — Y999 Unspecified external cause status: Secondary | ICD-10-CM | POA: Diagnosis not present

## 2020-04-21 DIAGNOSIS — M7918 Myalgia, other site: Secondary | ICD-10-CM | POA: Insufficient documentation

## 2020-04-21 DIAGNOSIS — R519 Headache, unspecified: Secondary | ICD-10-CM | POA: Insufficient documentation

## 2020-04-21 DIAGNOSIS — R590 Localized enlarged lymph nodes: Secondary | ICD-10-CM | POA: Diagnosis not present

## 2020-04-21 DIAGNOSIS — Y92410 Unspecified street and highway as the place of occurrence of the external cause: Secondary | ICD-10-CM | POA: Insufficient documentation

## 2020-04-21 DIAGNOSIS — S299XXA Unspecified injury of thorax, initial encounter: Secondary | ICD-10-CM | POA: Diagnosis not present

## 2020-04-21 DIAGNOSIS — S7011XA Contusion of right thigh, initial encounter: Secondary | ICD-10-CM | POA: Insufficient documentation

## 2020-04-21 DIAGNOSIS — Y9389 Activity, other specified: Secondary | ICD-10-CM | POA: Insufficient documentation

## 2020-04-21 DIAGNOSIS — R079 Chest pain, unspecified: Secondary | ICD-10-CM | POA: Diagnosis not present

## 2020-04-21 DIAGNOSIS — J45909 Unspecified asthma, uncomplicated: Secondary | ICD-10-CM | POA: Diagnosis not present

## 2020-04-21 DIAGNOSIS — S161XXA Strain of muscle, fascia and tendon at neck level, initial encounter: Secondary | ICD-10-CM | POA: Diagnosis not present

## 2020-04-21 DIAGNOSIS — R52 Pain, unspecified: Secondary | ICD-10-CM

## 2020-04-21 DIAGNOSIS — M79604 Pain in right leg: Secondary | ICD-10-CM | POA: Diagnosis not present

## 2020-04-21 DIAGNOSIS — S199XXA Unspecified injury of neck, initial encounter: Secondary | ICD-10-CM | POA: Diagnosis not present

## 2020-04-21 DIAGNOSIS — R0789 Other chest pain: Secondary | ICD-10-CM | POA: Diagnosis not present

## 2020-04-21 DIAGNOSIS — S0990XA Unspecified injury of head, initial encounter: Secondary | ICD-10-CM | POA: Diagnosis not present

## 2020-04-21 MED ORDER — METHOCARBAMOL 500 MG PO TABS: 500.0000 mg | ORAL_TABLET | Freq: Three times a day (TID) | ORAL | 0 refills | Status: DC | PRN

## 2020-04-21 MED ORDER — IOHEXOL 350 MG/ML SOLN
75.0000 mL | Freq: Once | INTRAVENOUS | Status: AC | PRN
  Administered 2020-04-21: 75 mL via INTRAVENOUS

## 2020-04-21 NOTE — Discharge Instructions (Addendum)
Follow-up with your doctor as needed for continued pain 
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
## 2020-04-21 NOTE — ED Notes (Signed)
Patient transported to CT 

## 2020-04-21 NOTE — ED Notes (Signed)
Patient verbalizes understanding of discharge instructions . Opportunity for questions and answers were provided . Armband removed by staff ,Pt discharged from ED. W/C  offered at D/C  and Declined W/C at D/C and was escorted to lobby by RN.  
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
## 2020-04-21 NOTE — ED Notes (Signed)
Family here to pick up child

## 2020-04-21 NOTE — ED Notes (Signed)
Patient transported to X-ray 

## 2020-05-18 DIAGNOSIS — Z113 Encounter for screening for infections with a predominantly sexual mode of transmission: Secondary | ICD-10-CM | POA: Diagnosis not present

## 2020-05-20 DIAGNOSIS — Z7251 High risk heterosexual behavior: Secondary | ICD-10-CM | POA: Diagnosis not present

## 2020-05-21 ENCOUNTER — Institutional Professional Consult (permissible substitution): Payer: Medicaid Other | Admitting: Plastic Surgery

## 2020-06-03 DIAGNOSIS — Z1152 Encounter for screening for COVID-19: Secondary | ICD-10-CM | POA: Diagnosis not present

## 2020-06-13 IMAGING — US OBSTETRIC <14 WK US AND TRANSVAGINAL OB US
1 series · 15 of 28 positions shown · non-contrast
Comparison: None.

CLINICAL DATA: Vaginal bleeding in 1st trimester pregnancy. Lower
abdominal pain for 2 days.

EXAM:
OBSTETRIC <14 WK US AND TRANSVAGINAL OB US
TECHNIQUE: Both transabdominal and transvaginal ultrasound examinations were
performed for complete evaluation of the gestation as well as the
maternal uterus, adnexal regions, and pelvic cul-de-sac.
Transvaginal technique was performed to assess early pregnancy.

[Series 1: obstetric <14 wk us and transvaginal ob us · 15 of 30 slices shown]
[im 1/30]
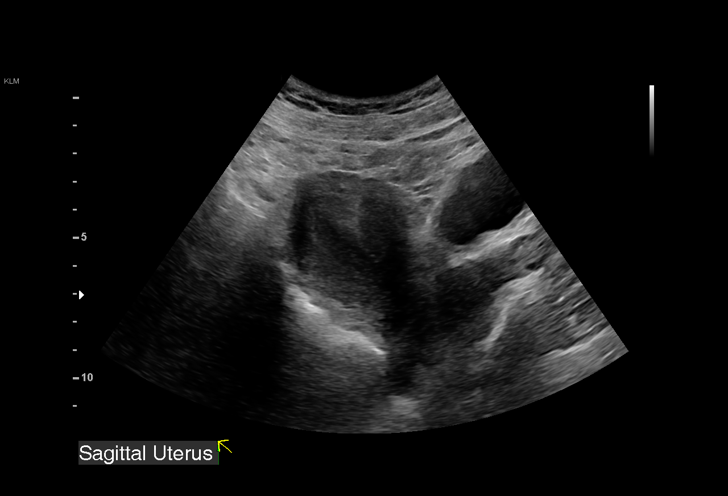
[im 3/30]
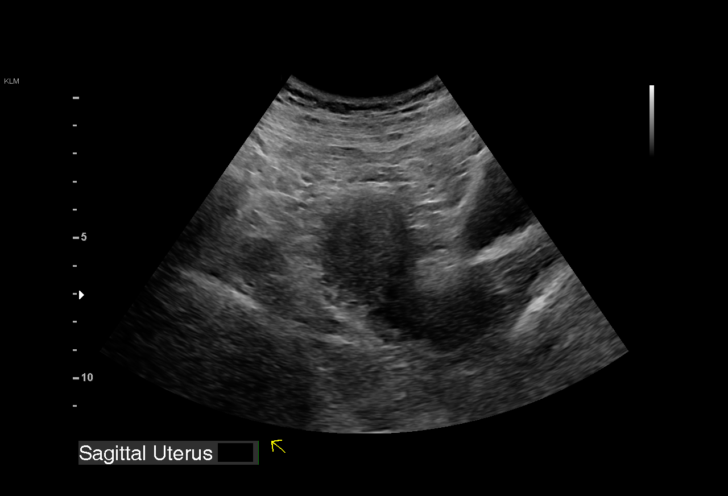
[im 5/30]
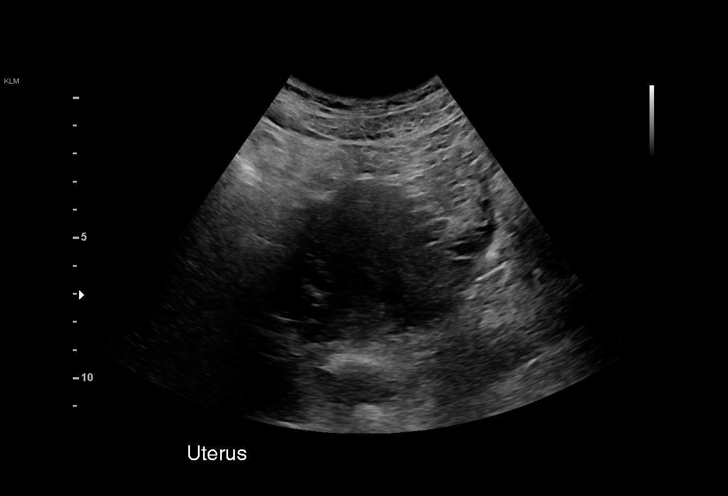
[im 7/30]
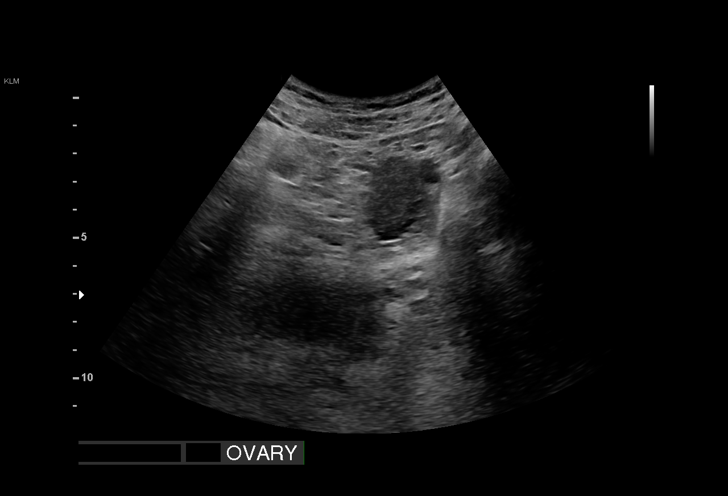
[im 9/30]
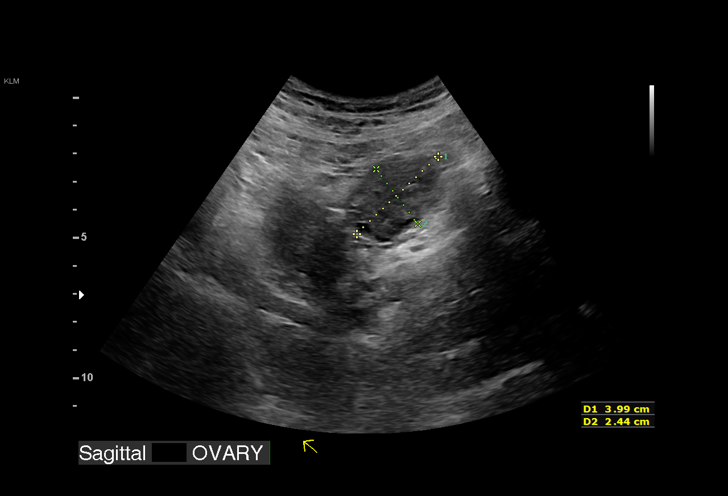
[im 11/30]
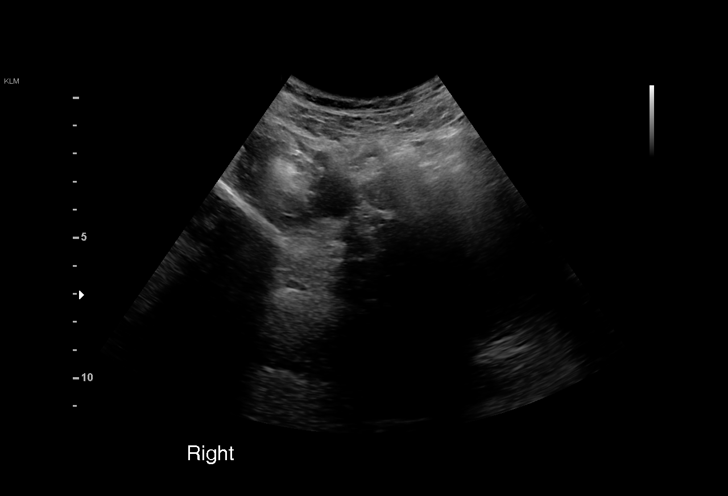
[im 13/30]
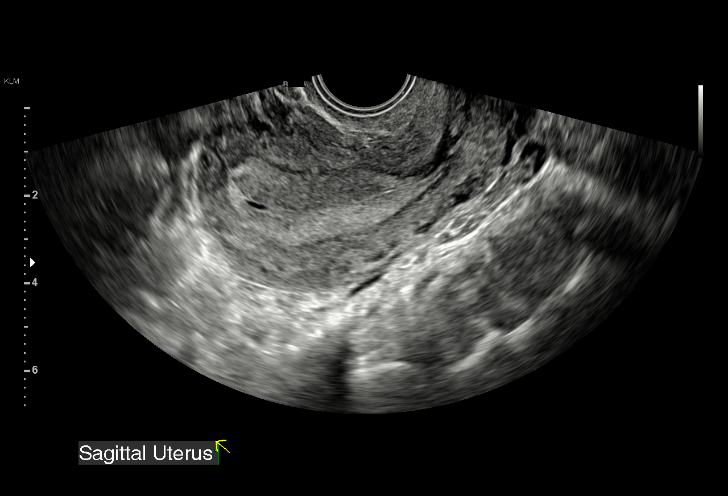
[im 16/30]
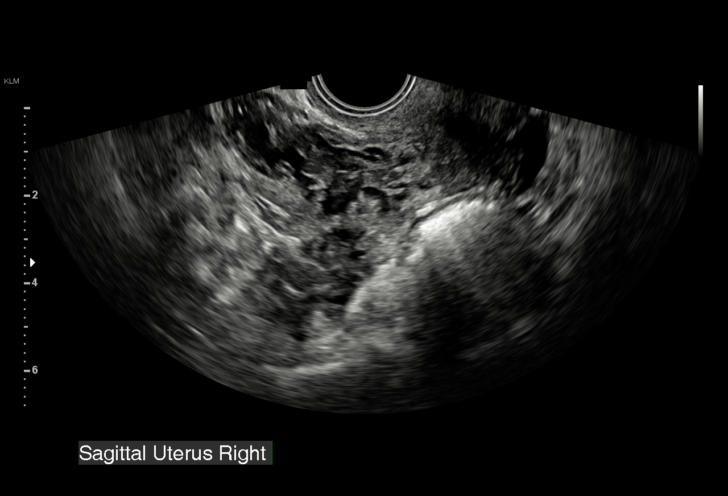
[im 17/30]
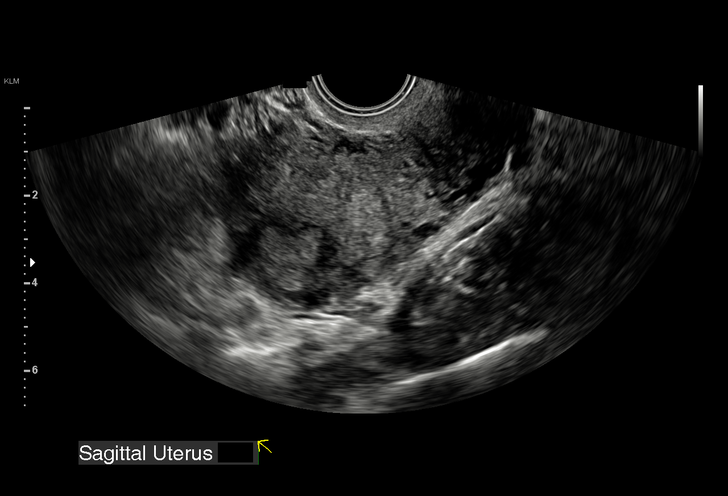
[im 19/30]
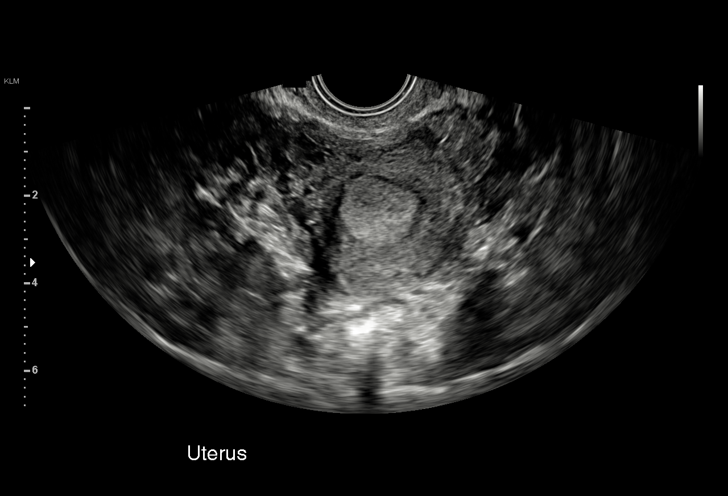
[im 21/30]
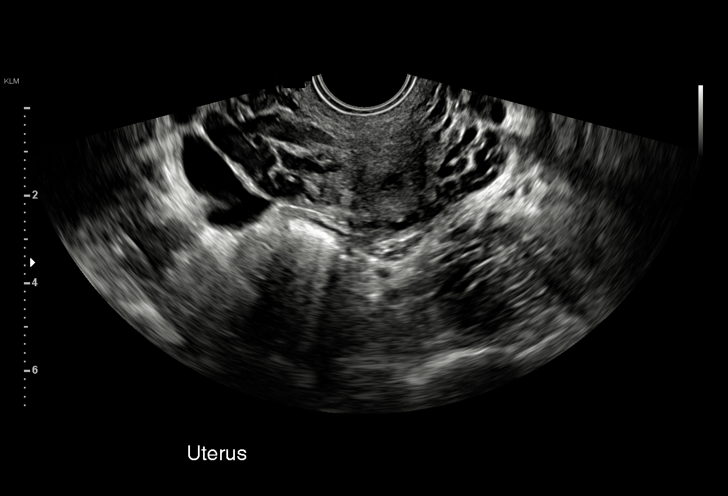
[im 23/30]
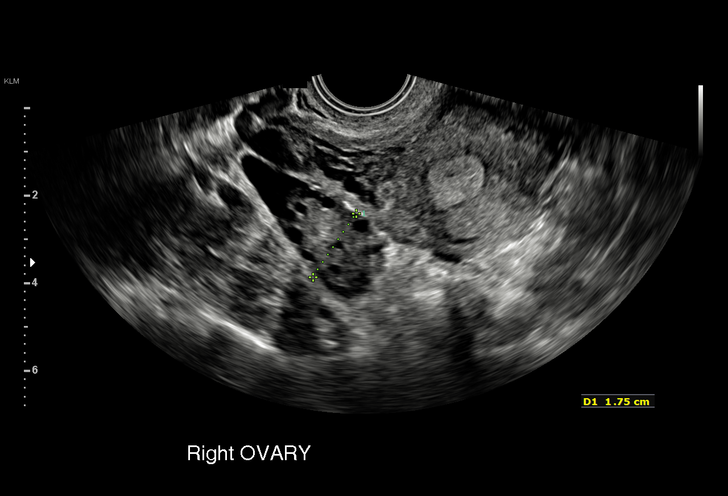
[im 25/30]
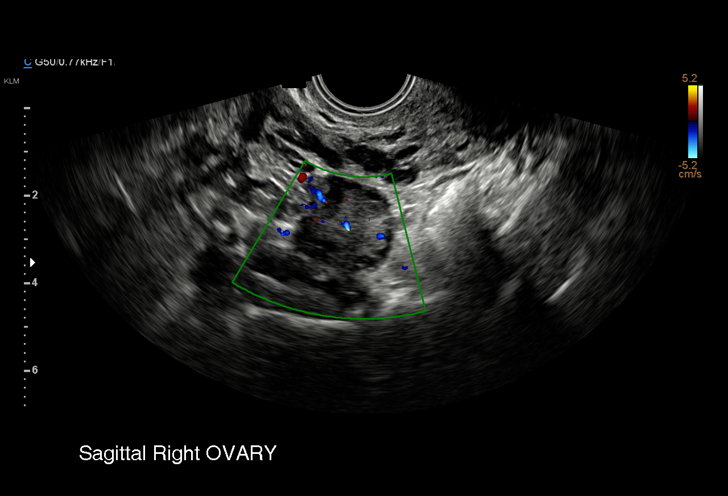
[im 27/30]
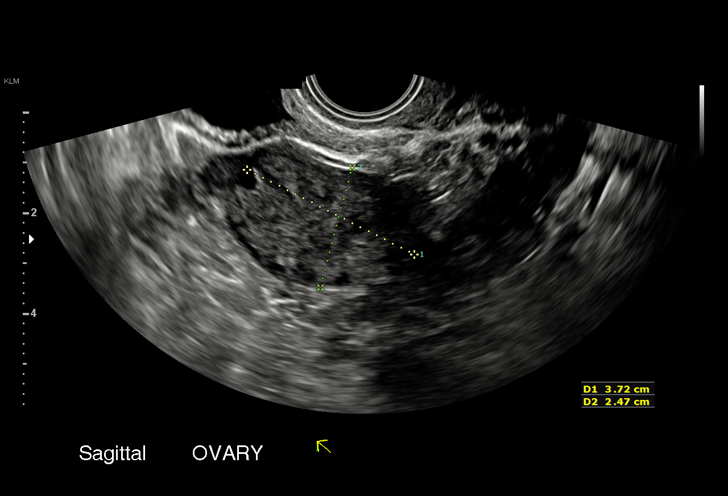
[im 30/30]
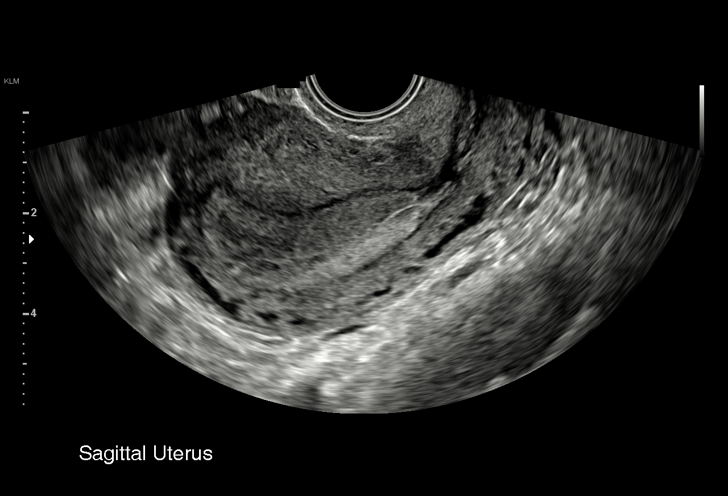

[15 of 28 positions shown; findings below may reference images not displayed]

FINDINGS: Intrauterine gestational sac: None

Maternal uterus/adnexae: Endometrial thickness measures 16 mm. No
fibroids identified. Both ovaries are normal in appearance. No mass
or abnormal free fluid identified.
IMPRESSION: Pregnancy of unknown anatomic location (no intrauterine gestational
sac or adnexal mass identified). Differential diagnosis includes
recent spontaneous abortion, IUP too early to visualize, and
non-visualized ectopic pregnancy. Recommend correlation with serial
beta-hCG levels, and follow up US if warranted clinically.

## 2020-07-08 ENCOUNTER — Other Ambulatory Visit: Payer: Self-pay

## 2020-07-08 ENCOUNTER — Inpatient Hospital Stay (HOSPITAL_COMMUNITY)
Admission: AD | Admit: 2020-07-08 | Discharge: 2020-07-08 | Disposition: A | Discharge status: Home / Self Care | Payer: Medicaid Other | Attending: Obstetrics & Gynecology | Admitting: Obstetrics & Gynecology

## 2020-07-08 DIAGNOSIS — N760 Acute vaginitis: Secondary | ICD-10-CM | POA: Diagnosis not present

## 2020-07-08 DIAGNOSIS — Z113 Encounter for screening for infections with a predominantly sexual mode of transmission: Secondary | ICD-10-CM | POA: Diagnosis not present

## 2020-07-08 DIAGNOSIS — N912 Amenorrhea, unspecified: Secondary | ICD-10-CM

## 2020-07-08 NOTE — MAU Note (Signed)
Andrea Jenkins is a 24 y.o. here in MAU reporting: states she has had a decreased appetite and feeling nauseated when she eats. Had + UPT yesterday. States she is having some white clumpy discharge but no bleeding or pain.  LMP: 06/12/20  Onset of complaint: ongoing  Pain score: 0/10  Vitals:   07/08/20 1348  BP: 119/70  Pulse: (!) 101  Resp: 16  Temp: 99.7 F (37.6 C)  SpO2: 100%     Lab orders placed from triage: none

## 2020-07-08 NOTE — MAU Provider Note (Signed)
[name] is a 24 y.o. [id number] who present to MAU today for pregnancy confirmation. She denies abdominal pain or vaginal bleeding.   BP 119/70 (BP Location: Right Arm)   Pulse (!) 101   Temp 99.7 F (37.6 C) (Oral)   Resp 16   Ht 5' 3.5" (1.613 m)   Wt 80.6 kg   LMP 06/12/2020   SpO2 100% Comment: room air  BMI 31.00 kg/m  CONSTITUTIONAL: Well-developed, well-nourished female in no acute distress.  CARDIOVASCULAR: Normal heart rate noted RESPIRATORY: Effort and breath sounds normal GASTROINTESTINAL:Soft, no distention noted.  No tenderness, rebound or guarding.  SKIN: Skin is warm and dry. No rash noted. Not diaphoretic. No erythema. No pallor. PSYCHIATRIC: Normal mood and affect. Normal behavior. Normal judgment and thought content.  MDM Medical screening exam complete Patient does not endorse any symptoms concerning for ectopic pregnancy or pregnancy related complication today.   A:  Amenorrhea  P: Discharge home Patient is a former patient of the [facility] office. She will call there to schedule pregnancy confirmation visit and start prenatal care.  Reasons to return to MAU reviewed  Patient may return to MAU as needed or if her condition were to change or worsen  Thressa Sheller DNP, CNM  07/08/20  1:57 PM

## 2020-07-09 ENCOUNTER — Telehealth: Payer: Self-pay | Admitting: *Deleted

## 2020-07-09 NOTE — Telephone Encounter (Addendum)
Message received by [facility] @ [facility]. She stated that pt was seen @ their location yesterday with +UPT. Pt reported LMP 04/28/20 and that she has irregular cycles. Abdominal and vaginal [place] were performed which showed no gestational sac. Pt is being referred to our office for follow up. Per consult w/Dr. Donavan Foil, pt to be given ectopic precautions and have stat BHCG on 9/20. Follow up will be determined based on those results. I called pt and informed her of the plan of care. Pt stated that she had gotten mixed up about her cycles and actually her LMP is 06/13/20. She denied having vaginal bleeding or abdominal/pelvic pain. I advised that we will still follow the recommended plan of care from the doctor. Pt was advised to go to [facility] if she develops heavy bleeding or abdominal pain greater than cramping. An appt for stat BHCG will be scheduled and she will be notified via MyChart. Pt voiced understanding and agreed to plan of care.   9/16  1610 Called pt and advised her that after discussion with Dr. Donavan Foil regarding her LMP of 06/13/20, pt does not require stat blood draw on 9/20. She should keep appt as scheduled on 08/21/20 for New Ob interview. If she develops heavy vaginal bleeding or severe abdominal/pelvic pain, she should go to MAU for evaluation. Pt voiced understanding.

## 2020-07-12 ENCOUNTER — Inpatient Hospital Stay (HOSPITAL_COMMUNITY)
Admission: AD | Admit: 2020-07-12 | Discharge: 2020-07-12 | Disposition: A | Discharge status: Home / Self Care | Payer: Medicaid Other | Attending: Obstetrics and Gynecology | Admitting: Obstetrics and Gynecology

## 2020-07-12 ENCOUNTER — Inpatient Hospital Stay (EMERGENCY_DEPARTMENT_HOSPITAL)
Admission: AD | Admit: 2020-07-12 | Discharge: 2020-07-12 | Disposition: A | Discharge status: Home / Self Care | Payer: Medicaid Other | Source: Home / Self Care | Attending: Obstetrics and Gynecology | Admitting: Obstetrics and Gynecology

## 2020-07-12 ENCOUNTER — Other Ambulatory Visit: Payer: Self-pay

## 2020-07-12 ENCOUNTER — Inpatient Hospital Stay (HOSPITAL_COMMUNITY): Payer: Medicaid Other

## 2020-07-12 ENCOUNTER — Encounter (HOSPITAL_COMMUNITY): Payer: Self-pay | Admitting: Obstetrics and Gynecology

## 2020-07-12 DIAGNOSIS — K529 Noninfective gastroenteritis and colitis, unspecified: Secondary | ICD-10-CM

## 2020-07-12 DIAGNOSIS — O3680X0 Pregnancy with inconclusive fetal viability, not applicable or unspecified: Secondary | ICD-10-CM

## 2020-07-12 DIAGNOSIS — F419 Anxiety disorder, unspecified: Secondary | ICD-10-CM | POA: Insufficient documentation

## 2020-07-12 DIAGNOSIS — A09 Infectious gastroenteritis and colitis, unspecified: Secondary | ICD-10-CM | POA: Diagnosis not present

## 2020-07-12 DIAGNOSIS — O99341 Other mental disorders complicating pregnancy, first trimester: Secondary | ICD-10-CM | POA: Insufficient documentation

## 2020-07-12 DIAGNOSIS — Z3A01 Less than 8 weeks gestation of pregnancy: Secondary | ICD-10-CM | POA: Insufficient documentation

## 2020-07-12 DIAGNOSIS — O26891 Other specified pregnancy related conditions, first trimester: Secondary | ICD-10-CM | POA: Insufficient documentation

## 2020-07-12 DIAGNOSIS — Z87891 Personal history of nicotine dependence: Secondary | ICD-10-CM | POA: Insufficient documentation

## 2020-07-12 DIAGNOSIS — O99611 Diseases of the digestive system complicating pregnancy, first trimester: Secondary | ICD-10-CM | POA: Insufficient documentation

## 2020-07-12 DIAGNOSIS — O98811 Other maternal infectious and parasitic diseases complicating pregnancy, first trimester: Secondary | ICD-10-CM | POA: Diagnosis not present

## 2020-07-12 DIAGNOSIS — R112 Nausea with vomiting, unspecified: Secondary | ICD-10-CM | POA: Diagnosis not present

## 2020-07-12 DIAGNOSIS — R109 Unspecified abdominal pain: Secondary | ICD-10-CM | POA: Insufficient documentation

## 2020-07-12 DIAGNOSIS — F329 Major depressive disorder, single episode, unspecified: Secondary | ICD-10-CM | POA: Insufficient documentation

## 2020-07-12 DIAGNOSIS — R110 Nausea: Secondary | ICD-10-CM | POA: Diagnosis not present

## 2020-07-12 DIAGNOSIS — O26899 Other specified pregnancy related conditions, unspecified trimester: Secondary | ICD-10-CM

## 2020-07-12 DIAGNOSIS — Z79899 Other long term (current) drug therapy: Secondary | ICD-10-CM | POA: Insufficient documentation

## 2020-07-12 DIAGNOSIS — O219 Vomiting of pregnancy, unspecified: Secondary | ICD-10-CM | POA: Insufficient documentation

## 2020-07-12 LAB — URINALYSIS, ROUTINE W REFLEX MICROSCOPIC
Bilirubin Urine: NEGATIVE
Glucose, UA: NEGATIVE mg/dL
Hgb urine dipstick: NEGATIVE
Ketones, ur: NEGATIVE mg/dL
Leukocytes,Ua: NEGATIVE
Nitrite: NEGATIVE
Protein, ur: NEGATIVE mg/dL
Specific Gravity, Urine: 1.021 (ref 1.005–1.030)
pH: 5 (ref 5.0–8.0)

## 2020-07-12 LAB — CBC
HCT: 34.2 % — ABNORMAL LOW (ref 36.0–46.0)
Hemoglobin: 10.8 g/dL — ABNORMAL LOW (ref 12.0–15.0)
MCH: 26.7 pg (ref 26.0–34.0)
MCHC: 31.6 g/dL (ref 30.0–36.0)
MCV: 84.4 fL (ref 80.0–100.0)
Platelets: 232 10*3/uL (ref 150–400)
RBC: 4.05 MIL/uL (ref 3.87–5.11)
RDW: 14.6 % (ref 11.5–15.5)
WBC: 4.7 10*3/uL (ref 4.0–10.5)
nRBC: 0 % (ref 0.0–0.2)

## 2020-07-12 LAB — WET PREP, GENITAL
Clue Cells Wet Prep HPF POC: NONE SEEN
Sperm: NONE SEEN
Trich, Wet Prep: NONE SEEN
Yeast Wet Prep HPF POC: NONE SEEN

## 2020-07-12 LAB — HCG, QUANTITATIVE, PREGNANCY: hCG, Beta Chain, Quant, S: 86 m[IU]/mL — ABNORMAL HIGH (ref <5)

## 2020-07-12 LAB — POCT PREGNANCY, URINE: Preg Test, Ur: POSITIVE — AB

## 2020-07-12 MED ORDER — METOCLOPRAMIDE HCL 10 MG PO TABS: 10.0000 mg | ORAL_TABLET | Freq: Three times a day (TID) | ORAL | 0 refills | Status: DC | PRN

## 2020-07-12 NOTE — MAU Note (Signed)
Had some mild cramping when woke up this morning.  Had some diarrhea also, thought it might be from that.  Has continued to have diarrhea (x4 watery)  and vomiting. No one at home is sick

## 2020-07-12 NOTE — Discharge Instructions (Signed)
Return to care   If you have heavier bleeding that soaks through more that 2 pads per hour for an hour or more  If you bleed so much that you feel like you might pass out or you do pass out  If you have significant abdominal pain that is not improved with Tylenol    Bland Diet A bland diet consists of foods that are often soft and do not have a lot of fat, fiber, or extra seasonings. Foods without fat, fiber, or seasoning are easier for the body to digest. They are also less likely to irritate your mouth, throat, stomach, and other parts of your digestive system. A bland diet is sometimes called a BRAT diet. What is my plan? Your health care provider or food and nutrition specialist (dietitian) may recommend specific changes to your diet to prevent symptoms or to treat your symptoms. These changes may include:  Eating small meals often.  Cooking food until it is soft enough to chew easily.  Chewing your food well.  Drinking fluids slowly.  Not eating foods that are very spicy, sour, or fatty.  Not eating citrus fruits, such as oranges and grapefruit. What do I need to know about this diet?  Eat a variety of foods from the bland diet food list.  Do not follow a bland diet longer than needed.  Ask your health care provider whether you should take vitamins or supplements. What foods can I eat? Grains  Hot cereals, such as cream of wheat. Rice. Bread, crackers, or tortillas made from refined white flour. Vegetables Canned or cooked vegetables. Mashed or boiled potatoes. Fruits  Bananas. Applesauce. Other types of cooked or canned fruit with the skin and seeds removed, such as canned peaches or pears. Meats and other proteins  Scrambled eggs. Creamy peanut butter or other nut butters. Lean, well-cooked meats, such as chicken or fish. Tofu. Soups or broths. Dairy Low-fat dairy products, such as milk, cottage cheese, or yogurt. Beverages  Water. Herbal tea. Apple juice. Fats  and oils Mild salad dressings. Canola or olive oil. Sweets and desserts Pudding. Custard. Fruit gelatin. Ice cream. The items listed above may not be a complete list of recommended foods and beverages. Contact a dietitian for more options. What foods are not recommended? Grains Whole grain breads and cereals. Vegetables Raw vegetables. Fruits Raw fruits, especially citrus, berries, or dried fruits. Dairy Whole fat dairy foods. Beverages Caffeinated drinks. Alcohol. Seasonings and condiments Strongly flavored seasonings or condiments. Hot sauce. Salsa. Other foods Spicy foods. Fried foods. Sour foods, such as pickled or fermented foods. Foods with high sugar content. Foods high in fiber. The items listed above may not be a complete list of foods and beverages to avoid. Contact a dietitian for more information. Summary  A bland diet consists of foods that are often soft and do not have a lot of fat, fiber, or extra seasonings.  Foods without fat, fiber, or seasoning are easier for the body to digest.  Check with your health care provider to see how long you should follow this diet plan. It is not meant to be followed for long periods. This information is not intended to replace advice given to you by your health care provider. Make sure you discuss any questions you have with your health care provider. Document Revised: 11/09/2017 Document Reviewed: 11/09/2017 Elsevier Patient Education  2020 [employer] Inc[employer]      Viral Gastroenteritis, Adult  Viral gastroenteritis is also known as the stomach flu.  This condition may affect your stomach, your small intestine, and your large intestine. It can cause sudden watery poop (diarrhea), fever, and throwing up (vomiting). This condition is caused by certain germs (viruses). These germs can be passed from person to person very easily (are contagious). Having watery poop and throwing up can make you feel weak and cause you to not have enough  water in your body (get dehydrated). This can make you tired and thirsty, make you have a dry mouth, and make it so you pee (urinate) less often. It is important to replace the fluids that you lose from having watery poop and throwing up. What are the causes?  You can get sick by catching viruses from other people.  You can also get sick by: ? Eating food, drinking water, or touching a surface that has the viruses on it (is contaminated). ? Sharing utensils or other personal items with a person who is sick. What increases the risk?  Having a weak body defense system (immune system).  Living with one or more children who are younger than 45 years old.  Living in a nursing home.  Going on cruise ships. What are the signs or symptoms? Symptoms of this condition start suddenly. Symptoms may last for a few days or for as long as a week.  Common symptoms include: ? Watery poop. ? Throwing up.  Other symptoms include: ? Fever. ? Headache. ? Feeling tired (fatigue). ? Pain in the belly (abdomen). ? Chills. ? Feeling weak. ? Feeling sick to your stomach (nauseous). ? Muscle aches. ? Not feeling hungry. How is this treated?  This condition typically goes away on its own.  The focus of treatment is to replace the fluids that you lose. This condition may be treated with: ? An ORS (oral rehydration solution). This is a drink that is sold at pharmacies and stores. ? Medicines to help with your symptoms. ? Probiotic supplements to reduce symptoms of diarrhea. ? Fluids given through an IV tube, if needed.  Older adults and people with other diseases or a weak body defense system are at higher risk for not having enough water in the body. Follow these instructions at home: Eating and drinking   Take an ORS as told by your doctor.  Drink clear fluids in small amounts as you are able. Clear fluids include: ? Water. ? Ice chips. ? Fruit juice with water added to it  (diluted). ? Low-calorie sports drinks.  Drink enough fluid to keep your pee (urine) pale yellow.  Eat small amounts of healthy foods every 3-4 hours as you are able. This may include whole grains, fruits, vegetables, lean meats, and yogurt.  Avoid fluids that have a lot of sugar or caffeine in them, such as energy drinks, sports drinks, and soda.  Avoid spicy or fatty foods.  Avoid alcohol. General instructions   Wash your hands often. This is very important after you have watery poop or you throw up. If you cannot use soap and water, use hand sanitizer.  Make sure that all people in your home wash their hands well and often.  Take over-the-counter and prescription medicines only as told by your doctor.  Rest at home while you get better.  Watch your condition for any changes.  Take a warm bath to help with any burning or pain from having watery poop.  Keep all follow-up visits as told by your doctor. This is important. Contact a doctor if:  You cannot keep fluids  down.  Your symptoms get worse.  You have new symptoms.  You feel light-headed.  You feel dizzy.  You have muscle cramps. Get help right away if:  You have chest pain.  You feel very weak.  You pass out (faint).  You see blood in your throw-up.  Your throw-up looks like coffee grounds.  You have bloody or black poop (stools) or poop that looks like tar.  You have a very bad headache, or a stiff neck, or both.  You have a rash.  You have very bad pain, cramping, or bloating in your belly.  You have trouble breathing.  You are breathing very quickly.  You have a fast heartbeat.  Your skin feels cold and clammy.  You feel mixed up (confused).  You have pain when you pee.  You have signs of not having enough water in the body, such as: ? Dark pee, hardly any pee, or no pee. ? Cracked lips. ? Dry mouth. ? Sunken eyes. ? Feeling very sleepy. ? Feeling weak. Summary  Viral  gastroenteritis is also known as the stomach flu.  This condition can cause sudden watery poop (diarrhea), fever, and throwing up (vomiting).  These germs can be passed from person to person very easily.  Take an ORS as told by your doctor. This is a drink that is sold at pharmacies and stores.  Drink fluids in small amounts many times each day as you are able. This information is not intended to replace advice given to you by your health care provider. Make sure you discuss any questions you have with your health care provider. Document Revised: 08/16/2018 Document Reviewed: 08/16/2018 Elsevier Patient Education  2020 [employer].

## 2020-07-12 NOTE — MAU Provider Note (Signed)
Chief Complaint: Abdominal Pain, Nausea, and Emesis   First Provider Initiated Contact with Patient 07/12/20 0957     SUBJECTIVE HPI: Andrea Jenkins is a 24 y.o. G3P2002 at 2363w1d who presents to [facility] reporting abdominal cramping and n/v/d. Symptoms started this morning. Reports abdominal cramping throughout her abdomen. Has had 4 episodes of watery stools today and has vomited several times. Denies fever/chills, dysuria, vaginal bleeding, or vaginal discharge. No sick contacts. No recent antibiotics or hospitalizations. Hasn't treated symptoms.   Location: abdomen Quality: cramping Severity: 5/10 on pain scale Duration: 1 day Timing: intermittent Modifying factors: none Associated signs and symptoms: n/v/d  Past Medical History:  Diagnosis Date  . Anxiety   . Asthma    pt states she doesn't use inhaler much (04/10/19)  . Depression   . Gonorrhea   . Heart murmur    when younger  . Vaginal Pap smear, abnormal    OB History  Gravida Para Term Preterm AB Living  3 2 2  0 0 2  SAB TAB Ectopic Multiple Live Births  0 0 0 0 2    # Outcome Date GA Lbr Len/2nd Weight Sex Delivery Anes PTL Lv  3 Current           2 Term 12/04/19 2854w0d 02:45 / 00:20 3201 g M Vag-Spont EPI  LIV  1 Term 01/18/18 5555w6d 07:57 / 00:18 3365 g F Vag-Spont EPI  LIV   Past Surgical History:  Procedure Laterality Date  . CATARACT EXTRACTION    . WISDOM TOOTH EXTRACTION    . WRIST SURGERY     Social History   Socioeconomic History  . Marital status: Single    Spouse name: Not on file  . Number of children: Not on file  . Years of education: Not on file  . Highest education level: Not on file  Occupational History  . Not on file  Tobacco Use  . Smoking status: Former Smoker    Packs/day: 0.25    Types: Cigars    Quit date: 03/11/2017    Years since quitting: 3.3  . Smokeless tobacco: Never Used  Vaping Use  . Vaping Use: Never used  Substance and Sexual Activity  . Alcohol use:  No    Alcohol/week: 0.0 standard drinks    Comment: mimosa on saturday. 04/06/19  . Drug use: Not Currently    Types: Marijuana  . Sexual activity: Yes    Partners: Male    Birth control/protection: None  Other Topics Concern  . Not on file  Social History Narrative  . Not on file   Social Determinants of Health   Financial Resource Strain:   . Difficulty of Paying Living Expenses: Not on file  Food Insecurity:   . Worried About Programme researcher, broadcasting/film/videounning Out of Food in the Last Year: Not on file  . Ran Out of Food in the Last Year: Not on file  Transportation Needs:   . Lack of Transportation (Medical): Not on file  . Lack of Transportation (Non-Medical): Not on file  Physical Activity:   . Days of Exercise per Week: Not on file  . Minutes of Exercise per Session: Not on file  Stress:   . Feeling of Stress : Not on file  Social Connections:   . Frequency of Communication with Friends and Family: Not on file  . Frequency of Social Gatherings with Friends and Family: Not on file  . Attends Religious Services: Not on file  . Active Member of Clubs  or Organizations: Not on file  . Attends Banker Meetings: Not on file  . Marital Status: Not on file  Intimate Partner Violence:   . Fear of Current or Ex-Partner: Not on file  . Emotionally Abused: Not on file  . Physically Abused: Not on file  . Sexually Abused: Not on file   Family History  Problem Relation Age of Onset  . Hypertension Mother   . Miscarriages / [place] Mother   . Hypertension Maternal Grandmother   . Asthma Maternal Grandmother   . Diabetes Maternal Grandmother   . Vision loss Maternal Grandmother   . Mental illness Brother   . Cancer Maternal Aunt   . Mental illness Sister   . Heart disease Neg Hx    No current facility-administered medications on file prior to encounter.   Current Outpatient Medications on File Prior to Encounter  Medication Sig Dispense Refill  . acetaminophen (TYLENOL) 325 MG  tablet Take 2 tablets (650 mg total) by mouth every 4 (four) hours as needed (for pain scale < 4). (Patient not taking: Reported on 01/15/2020) 30 tablet 0  . buPROPion (WELLBUTRIN) 75 MG tablet Take 1 tablet (75 mg total) by mouth daily. 30 tablet 2  . fluconazole (DIFLUCAN) 150 MG tablet Take 150 mg by mouth once.    . Prenat-Fe Poly-Methfol-FA-DHA (VITAFOL ULTRA) 29-0.6-0.4-200 MG CAPS Take 1 tablet by mouth daily. 30 capsule 12  . [DISCONTINUED] omeprazole (PRILOSEC) 20 MG capsule Take 1 capsule (20 mg total) by mouth 2 (two) times daily before a meal. (Patient not taking: Reported on 08/31/2019) 60 capsule 5   Allergies  Allergen Reactions  . Peach [Prunus Persica] Hives    I have reviewed patient's Past Medical Hx, Surgical Hx, Family Hx, Social Hx, medications and allergies.   Review of Systems  Constitutional: Negative.   Gastrointestinal: Positive for abdominal pain, diarrhea, nausea and vomiting. Negative for blood in stool and constipation.  Genitourinary: Negative.     OBJECTIVE No data found. Constitutional: Well-developed, well-nourished female in no acute distress.  Cardiovascular: normal rate & rhythm, no murmur Respiratory: normal rate and effort. Lung sounds clear throughout GI: Abd soft, non-tender, Pos BS x 4. No guarding or rebound tenderness MS: Extremities nontender, no edema, normal ROM Neurologic: Alert and oriented x 4.      LAB RESULTS Results for orders placed or performed during the hospital encounter of 07/12/20 (from the past 24 hour(s))  Wet prep, genital     Status: Abnormal   Collection Time: 07/12/20 10:27 AM   Specimen: PATH Cytology Cervicovaginal Ancillary Only  Result Value Ref Range   Yeast Wet Prep HPF POC NONE SEEN NONE SEEN   Trich, Wet Prep NONE SEEN NONE SEEN   Clue Cells Wet Prep HPF POC NONE SEEN NONE SEEN   WBC, Wet Prep HPF POC MANY (A) NONE SEEN   Sperm NONE SEEN   CBC     Status: Abnormal   Collection Time: 07/12/20 10:42 AM   Result Value Ref Range   WBC 4.7 4.0 - 10.5 K/uL   RBC 4.05 3.87 - 5.11 MIL/uL   Hemoglobin 10.8 (L) 12.0 - 15.0 g/dL   HCT 49.1 (L) 36 - 46 %   MCV 84.4 80.0 - 100.0 fL   MCH 26.7 26.0 - 34.0 pg   MCHC 31.6 30.0 - 36.0 g/dL   RDW 79.1 50.5 - 69.7 %   Platelets 232 150 - 400 K/uL   nRBC 0.0 0.0 - 0.2 %  hCG, quantitative, pregnancy     Status: Abnormal   Collection Time: 07/12/20 10:42 AM  Result Value Ref Range   hCG, Beta Chain, Quant, S 86 (H) <5 mIU/mL    IMAGING US OB LESS THAN 14 WEEKS WITH OB TRANSVAGINAL  Result Date: 07/12/2020 CLINICAL DATA:  Abdominal pain with nausea. EXAM: OBSTETRIC <14 WK [place] AND TRANSVAGINAL OB US TECHNIQUE: Transvaginal ultrasound was performed for complete evaluation of the gestation as well as the maternal uterus, adnexal regions, and pelvic cul-de-sac. COMPARISON:  None. FINDINGS: Intrauterine gestational sac: Single Yolk sac:  No Embryo:  No MSD: 8.3  mm   5 w   4 d Maternal uterus/adnexae: Subchorionic hemorrhage: None Right ovary: Normal Left ovary: Normal Other :None Free fluid:  Small volume of free fluid noted within the cul-de-sac. IMPRESSION: Probable early intrauterine gestational sac, but no yolk sac, fetal pole, or cardiac activity yet visualized. Recommend follow-up quantitative B-HCG levels and follow-up US in 14 days to assess viability. This recommendation follows SRU consensus guidelines: Diagnostic Criteria for Nonviable Pregnancy Early in the First Trimester. Malva Limes Med 2013; 413:2440-10. Electronically Signed   By: Signa Kell M.D.   On: 07/12/2020 11:47    MAU COURSE Orders Placed This Encounter  Procedures  . Wet prep, genital  . US OB LESS THAN 14 WEEKS WITH OB TRANSVAGINAL  . CBC  . hCG, quantitative, pregnancy  . Diet NPO time specified  . Contact Isolation: Enteric  . Discharge patient   Meds ordered this encounter  Medications  . metoCLOPramide (REGLAN) 10 MG tablet    Sig: Take 1 tablet (10 mg total) by mouth  every 8 (eight) hours as needed for nausea.    Dispense:  30 tablet    Refill:  0    Order Specific Question:   Supervising Provider    Answer:   Liberty Bing [[id number]]    MDM +UPT UA, wet prep, GC/chlamydia, CBC, ABO/Rh, quant hCG, and [place] today to rule out ectopic pregnancy which can be life threatening.   Ultrasound shows ?IUGS, no YS or embryo. No adnexal masses. HCG today is 86. Ectopic vs SAB vs pregnancy to early to be seen. Will bring to office on Monday for repeat HCG.  Suspect today's cramping is related to GI illness. Will rx reglan for nausea.   ASSESSMENT 1. Pregnancy of unknown anatomic location   2. Abdominal cramping affecting pregnancy   3. Gastroenteritis presumed infectious     PLAN Discharge home in stable condition. Rx reglan Scheduled for stat HCG at [facility] on Monday GC/CT pending Bland diet for diarrhea Discussed reasons to return to MAU   Allergies as of 07/12/2020      Reactions   Peach [prunus Persica] Hives      Medication List    STOP taking these medications   fluconazole 150 MG tablet Commonly known as: DIFLUCAN     TAKE these medications   acetaminophen 325 MG tablet Commonly known as: Tylenol Take 2 tablets (650 mg total) by mouth every 4 (four) hours as needed (for pain scale < 4).   buPROPion 75 MG tablet Commonly known as: WELLBUTRIN Take 1 tablet (75 mg total) by mouth daily.   metoCLOPramide 10 MG tablet Commonly known as: REGLAN Take 1 tablet (10 mg total) by mouth every 8 (eight) hours as needed for nausea.   Vitafol Ultra 29-0.6-0.4-200 MG Caps Take 1 tablet by mouth daily.        [name], NP 07/12/2020  12:07 PM

## 2020-07-14 ENCOUNTER — Other Ambulatory Visit (INDEPENDENT_AMBULATORY_CARE_PROVIDER_SITE_OTHER): Payer: Medicaid Other | Admitting: *Deleted

## 2020-07-14 ENCOUNTER — Other Ambulatory Visit: Payer: Self-pay

## 2020-07-14 ENCOUNTER — Telehealth: Payer: Self-pay | Admitting: *Deleted

## 2020-07-14 ENCOUNTER — Other Ambulatory Visit: Payer: Medicaid Other

## 2020-07-14 DIAGNOSIS — O3680X0 Pregnancy with inconclusive fetal viability, not applicable or unspecified: Secondary | ICD-10-CM | POA: Diagnosis not present

## 2020-07-14 LAB — GC/CHLAMYDIA PROBE AMP (~~LOC~~) NOT AT ARMC
Chlamydia: NEGATIVE
Comment: NEGATIVE
Comment: NORMAL
Neisseria Gonorrhea: NEGATIVE

## 2020-07-14 LAB — BETA HCG QUANT (REF LAB): hCG Quant: 200 m[IU]/mL

## 2020-07-14 NOTE — Progress Notes (Signed)
Pt is in office for Stat Hcg after recent MAU visit.  Pt denies any pain, bleeding and is having some mild cramping.  Pt advised will have Hcg drawn today to determine plan. Pt advised to be seen at hospital with any emergent needs.  Ectopic precautions given.   Pt made aware she will be contacted once results are in office and have been reviewed with provider.

## 2020-07-14 NOTE — Telephone Encounter (Signed)
Call placed to pt to discuss Hcg results. Per Dr Debroah Loop, pt to repeat lab in 1 week and then will be scheduled for u/s based on results.  Pt made aware of plan and has been scheduled for lab visit in 1 week.   Pt states understanding.

## 2020-07-15 ENCOUNTER — Encounter (HOSPITAL_COMMUNITY): Payer: Self-pay | Admitting: Obstetrics & Gynecology

## 2020-07-15 ENCOUNTER — Inpatient Hospital Stay (HOSPITAL_COMMUNITY)
Admission: AD | Admit: 2020-07-15 | Discharge: 2020-07-15 | Disposition: A | Discharge status: Home / Self Care | Payer: Medicaid Other | Attending: Obstetrics & Gynecology | Admitting: Obstetrics & Gynecology

## 2020-07-15 DIAGNOSIS — I959 Hypotension, unspecified: Secondary | ICD-10-CM | POA: Diagnosis not present

## 2020-07-15 DIAGNOSIS — Z3A01 Less than 8 weeks gestation of pregnancy: Secondary | ICD-10-CM | POA: Diagnosis not present

## 2020-07-15 DIAGNOSIS — O99891 Other specified diseases and conditions complicating pregnancy: Secondary | ICD-10-CM | POA: Diagnosis not present

## 2020-07-15 DIAGNOSIS — Z79899 Other long term (current) drug therapy: Secondary | ICD-10-CM | POA: Insufficient documentation

## 2020-07-15 DIAGNOSIS — O26891 Other specified pregnancy related conditions, first trimester: Secondary | ICD-10-CM | POA: Diagnosis not present

## 2020-07-15 DIAGNOSIS — R1010 Upper abdominal pain, unspecified: Secondary | ICD-10-CM

## 2020-07-15 DIAGNOSIS — O99341 Other mental disorders complicating pregnancy, first trimester: Secondary | ICD-10-CM | POA: Diagnosis not present

## 2020-07-15 DIAGNOSIS — O219 Vomiting of pregnancy, unspecified: Secondary | ICD-10-CM | POA: Diagnosis not present

## 2020-07-15 DIAGNOSIS — R52 Pain, unspecified: Secondary | ICD-10-CM | POA: Diagnosis not present

## 2020-07-15 DIAGNOSIS — F419 Anxiety disorder, unspecified: Secondary | ICD-10-CM | POA: Diagnosis not present

## 2020-07-15 DIAGNOSIS — F329 Major depressive disorder, single episode, unspecified: Secondary | ICD-10-CM | POA: Insufficient documentation

## 2020-07-15 DIAGNOSIS — Z87891 Personal history of nicotine dependence: Secondary | ICD-10-CM | POA: Diagnosis not present

## 2020-07-15 DIAGNOSIS — Z3490 Encounter for supervision of normal pregnancy, unspecified, unspecified trimester: Secondary | ICD-10-CM

## 2020-07-15 DIAGNOSIS — R1084 Generalized abdominal pain: Secondary | ICD-10-CM | POA: Diagnosis not present

## 2020-07-15 LAB — URINALYSIS, ROUTINE W REFLEX MICROSCOPIC
Bilirubin Urine: NEGATIVE
Glucose, UA: NEGATIVE mg/dL
Hgb urine dipstick: NEGATIVE
Ketones, ur: NEGATIVE mg/dL
Leukocytes,Ua: NEGATIVE
Nitrite: NEGATIVE
Protein, ur: NEGATIVE mg/dL
Specific Gravity, Urine: 1.015 (ref 1.005–1.030)
pH: 7 (ref 5.0–8.0)

## 2020-07-15 LAB — CBC
HCT: 34.6 % — ABNORMAL LOW (ref 36.0–46.0)
Hemoglobin: 11 g/dL — ABNORMAL LOW (ref 12.0–15.0)
MCH: 26.7 pg (ref 26.0–34.0)
MCHC: 31.8 g/dL (ref 30.0–36.0)
MCV: 84 fL (ref 80.0–100.0)
Platelets: 231 10*3/uL (ref 150–400)
RBC: 4.12 MIL/uL (ref 3.87–5.11)
RDW: 14.6 % (ref 11.5–15.5)
WBC: 7.6 10*3/uL (ref 4.0–10.5)
nRBC: 0 % (ref 0.0–0.2)

## 2020-07-15 LAB — COMPREHENSIVE METABOLIC PANEL
ALT: 16 U/L (ref 0–44)
AST: 22 U/L (ref 15–41)
Albumin: 3.8 g/dL (ref 3.5–5.0)
Alkaline Phosphatase: 46 U/L (ref 38–126)
Anion gap: 9 (ref 5–15)
BUN: 8 mg/dL (ref 6–20)
CO2: 23 mmol/L (ref 22–32)
Calcium: 9.1 mg/dL (ref 8.9–10.3)
Chloride: 105 mmol/L (ref 98–111)
Creatinine, Ser: 0.81 mg/dL (ref 0.44–1.00)
GFR calc Af Amer: >60 mL/min (ref >60)
GFR calc non Af Amer: >60 mL/min (ref >60)
Glucose, Bld: 109 mg/dL — ABNORMAL HIGH (ref 70–99)
Potassium: 3.6 mmol/L (ref 3.5–5.1)
Sodium: 137 mmol/L (ref 135–145)
Total Bilirubin: 0.4 mg/dL (ref 0.3–1.2)
Total Protein: 6.6 g/dL (ref 6.5–8.1)

## 2020-07-15 LAB — LIPASE, BLOOD: Lipase: 39 U/L (ref 11–51)

## 2020-07-15 NOTE — MAU Provider Note (Signed)
History     CSN: [id number]  Arrival date and time: 07/15/20 4193   First Provider Initiated Contact with Patient 07/15/20 0301     24 y.o. [id number] @[redacted]w[redacted]d  presenting via EMS for upper abd pain and N/V/D. Sx started around 1am. She had 5 episodes of emesis and 1 of diarrhea. She was feeling sharp pain above her umbilicus, was 8/10. No longer having pain since arrival. No longer having nausea. Was seen in MAU 3 days ago with similar sx and dx with GI virus. Denies LAP, VB, or discharge.   OB History    Gravida  3   Para  2   Term  2   Preterm  0   AB  0   Living  2     SAB  0   TAB  0   Ectopic  0   Multiple  0   Live Births  2           Past Medical History:  Diagnosis Date   Anxiety    Asthma    pt states she doesn't use inhaler much (04/10/19)   Depression    Gonorrhea    Heart murmur    when younger   Vaginal Pap smear, abnormal     Past Surgical History:  Procedure Laterality Date   CATARACT EXTRACTION     WISDOM TOOTH EXTRACTION     WRIST SURGERY      Family History  Problem Relation Age of Onset   Hypertension Mother    Miscarriages / 04/12/19 Mother    Hypertension Maternal Grandmother    Asthma Maternal Grandmother    Diabetes Maternal Grandmother    Vision loss Maternal Grandmother    Mental illness Brother    Cancer Maternal Aunt    Mental illness Sister    Heart disease Neg Hx     Social History   Tobacco Use   Smoking status: Former Smoker    Packs/day: 0.25    Types: Cigars    Quit date: 03/11/2017    Years since quitting: 3.3   Smokeless tobacco: Never Used  Vaping Use   Vaping Use: Never used  Substance Use Topics   Alcohol use: No    Alcohol/week: 0.0 standard drinks    Comment: mimosa on saturday. 04/06/19   Drug use: Not Currently    Types: Marijuana    Allergies:  Allergies  Allergen Reactions   Peach [Prunus Persica] Hives    Medications Prior to Admission  Medication Sig  Dispense Refill Last Dose   Prenat-Fe Poly-Methfol-FA-DHA (VITAFOL ULTRA) 29-0.6-0.4-200 MG CAPS Take 1 tablet by mouth daily. 30 capsule 12 07/14/2020 at Unknown time   acetaminophen (TYLENOL) 325 MG tablet Take 2 tablets (650 mg total) by mouth every 4 (four) hours as needed (for pain scale < 4). (Patient not taking: Reported on 01/15/2020) 30 tablet 0    buPROPion (WELLBUTRIN) 75 MG tablet Take 1 tablet (75 mg total) by mouth daily. 30 tablet 2    metoCLOPramide (REGLAN) 10 MG tablet Take 1 tablet (10 mg total) by mouth every 8 (eight) hours as needed for nausea. 30 tablet 0     Review of Systems  Gastrointestinal: Positive for abdominal pain (upper), diarrhea, nausea and vomiting.  Genitourinary: Negative for vaginal bleeding.   Physical Exam   Blood pressure 122/64, pulse 80, temperature 97.9 F (36.6 C), temperature source Oral, last menstrual period 06/13/2020, SpO2 100 %, not currently breastfeeding.  Physical Exam Vitals and nursing note  reviewed.  Constitutional:      Appearance: Normal appearance.  HENT:     Head: Normocephalic and atraumatic.  Cardiovascular:     Rate and Rhythm: Normal rate.  Pulmonary:     Effort: Pulmonary effort is normal. No respiratory distress.  Abdominal:     General: There is no distension.     Tenderness: There is abdominal tenderness in the epigastric area. There is no guarding or rebound.     Hernia: No hernia is present.  Musculoskeletal:        General: Normal range of motion.     Cervical back: Normal range of motion.  Skin:    General: Skin is warm and dry.  Neurological:     General: No focal deficit present.     Mental Status: She is alert and oriented to person, place, and time.  Psychiatric:        Mood and Affect: Mood normal.        Behavior: Behavior normal.    Results for orders placed or performed during the hospital encounter of 07/15/20 (from the past 24 hour(s))  Urinalysis, Routine w reflex microscopic Urine,  Clean Catch     Status: Abnormal   Collection Time: 07/15/20  2:47 AM  Result Value Ref Range   Color, Urine YELLOW YELLOW   APPearance HAZY (A) CLEAR   Specific Gravity, Urine 1.015 1.005 - 1.030   pH 7.0 5.0 - 8.0   Glucose, UA NEGATIVE NEGATIVE mg/dL   Hgb urine dipstick NEGATIVE NEGATIVE   Bilirubin Urine NEGATIVE NEGATIVE   Ketones, ur NEGATIVE NEGATIVE mg/dL   Protein, ur NEGATIVE NEGATIVE mg/dL   Nitrite NEGATIVE NEGATIVE   Leukocytes,Ua NEGATIVE NEGATIVE  CBC     Status: Abnormal   Collection Time: 07/15/20  3:36 AM  Result Value Ref Range   WBC 7.6 4.0 - 10.5 K/uL   RBC 4.12 3.87 - 5.11 MIL/uL   Hemoglobin 11.0 (L) 12.0 - 15.0 g/dL   HCT 14.7 (L) 36 - 46 %   MCV 84.0 80.0 - 100.0 fL   MCH 26.7 26.0 - 34.0 pg   MCHC 31.8 30.0 - 36.0 g/dL   RDW 82.9 56.2 - 13.0 %   Platelets 231 150 - 400 K/uL   nRBC 0.0 0.0 - 0.2 %  Comprehensive metabolic panel     Status: Abnormal   Collection Time: 07/15/20  3:36 AM  Result Value Ref Range   Sodium 137 135 - 145 mmol/L   Potassium 3.6 3.5 - 5.1 mmol/L   Chloride 105 98 - 111 mmol/L   CO2 23 22 - 32 mmol/L   Glucose, Bld 109 (H) 70 - 99 mg/dL   BUN 8 6 - 20 mg/dL   Creatinine, Ser 8.65 0.44 - 1.00 mg/dL   Calcium 9.1 8.9 - 78.4 mg/dL   Total Protein 6.6 6.5 - 8.1 g/dL   Albumin 3.8 3.5 - 5.0 g/dL   AST 22 15 - 41 U/L   ALT 16 0 - 44 U/L   Alkaline Phosphatase 46 38 - 126 U/L   Total Bilirubin 0.4 0.3 - 1.2 mg/dL   GFR calc non Af Amer >60 >60 mL/min   GFR calc Af Amer >60 >60 mL/min   Anion gap 9 5 - 15  Lipase, blood     Status: None   Collection Time: 07/15/20  3:36 AM  Result Value Ref Range   Lipase 39 11 - 51 U/L   MAU Course  Procedures  MDM Labs ordered and reviewed. No signs of acute process. Pt no longer having pain or N/V. Stable for discharge home.   Assessment and Plan   1. Upper abdominal pain   2. Early stage of pregnancy    Discharge home Follow up at [facility] as scheduled Return  precautions  Allergies as of 07/15/2020      Reactions   Peach [prunus Persica] Hives      Medication List    TAKE these medications   acetaminophen 325 MG tablet Commonly known as: Tylenol Take 2 tablets (650 mg total) by mouth every 4 (four) hours as needed (for pain scale < 4).   buPROPion 75 MG tablet Commonly known as: WELLBUTRIN Take 1 tablet (75 mg total) by mouth daily.   metoCLOPramide 10 MG tablet Commonly known as: REGLAN Take 1 tablet (10 mg total) by mouth every 8 (eight) hours as needed for nausea.   Vitafol Ultra 29-0.6-0.4-200 MG Caps Take 1 tablet by mouth daily.      Donette Larry, CNM 07/15/2020, 3:21 AM

## 2020-07-15 NOTE — Discharge Instructions (Signed)
Abdominal Pain During Pregnancy  Belly (abdominal) pain is common during pregnancy. There are many possible causes. Most of the time, it is not a serious problem. Other times, it can be a sign that something is wrong with the pregnancy. Always tell your doctor if you have belly pain. Follow these instructions at home:  Do not have sex or put anything in your vagina until your pain goes away completely.  Get plenty of rest until your pain gets better.  Drink enough fluid to keep your pee (urine) pale yellow.  Take over-the-counter and prescription medicines only as told by your doctor.  Keep all follow-up visits as told by your doctor. This is important. Contact a doctor if:  Your pain continues or gets worse after resting.  You have lower belly pain that: ? Comes and goes at regular times. ? Spreads to your back. ? Feels like menstrual cramps.  You have pain or burning when you pee (urinate). Get help right away if:  You have a fever or chills.  You have vaginal bleeding.  You are leaking fluid from your vagina.  You are passing tissue from your vagina.  You throw up (vomit) for more than 24 hours.  You have watery poop (diarrhea) for more than 24 hours.  Your baby is moving less than usual.  You feel very weak or faint.  You have shortness of breath.  You have very bad pain in your upper belly. Summary  Belly (abdominal) pain is common during pregnancy. There are many possible causes.  If you have belly pain during pregnancy, tell your doctor right away.  Keep all follow-up visits as told by your doctor. This is important. This information is not intended to replace advice given to you by your health care provider. Make sure you discuss any questions you have with your health care provider. Document Revised: 01/29/2019 Document Reviewed: 01/13/2017 Elsevier Patient Education  2020 [employer]  

## 2020-07-21 ENCOUNTER — Other Ambulatory Visit: Payer: Medicaid Other

## 2020-07-21 ENCOUNTER — Other Ambulatory Visit: Payer: Self-pay

## 2020-07-21 DIAGNOSIS — O3680X0 Pregnancy with inconclusive fetal viability, not applicable or unspecified: Secondary | ICD-10-CM

## 2020-07-22 ENCOUNTER — Other Ambulatory Visit: Payer: Self-pay

## 2020-07-22 DIAGNOSIS — O3680X0 Pregnancy with inconclusive fetal viability, not applicable or unspecified: Secondary | ICD-10-CM

## 2020-07-22 LAB — BETA HCG QUANT (REF LAB): hCG Quant: 4612 m[IU]/mL

## 2020-07-24 ENCOUNTER — Inpatient Hospital Stay (HOSPITAL_COMMUNITY)
Admission: AD | Admit: 2020-07-24 | Discharge: 2020-07-24 | Disposition: A | Discharge status: Home / Self Care | Payer: Medicaid Other | Attending: Obstetrics & Gynecology | Admitting: Obstetrics & Gynecology

## 2020-07-24 ENCOUNTER — Inpatient Hospital Stay (HOSPITAL_COMMUNITY): Payer: Medicaid Other

## 2020-07-24 ENCOUNTER — Encounter (HOSPITAL_COMMUNITY): Payer: Self-pay | Admitting: Obstetrics & Gynecology

## 2020-07-24 ENCOUNTER — Other Ambulatory Visit: Payer: Self-pay

## 2020-07-24 DIAGNOSIS — Z79899 Other long term (current) drug therapy: Secondary | ICD-10-CM | POA: Insufficient documentation

## 2020-07-24 DIAGNOSIS — Z3A01 Less than 8 weeks gestation of pregnancy: Secondary | ICD-10-CM | POA: Insufficient documentation

## 2020-07-24 DIAGNOSIS — R102 Pelvic and perineal pain: Secondary | ICD-10-CM | POA: Insufficient documentation

## 2020-07-24 DIAGNOSIS — O209 Hemorrhage in early pregnancy, unspecified: Secondary | ICD-10-CM | POA: Diagnosis not present

## 2020-07-24 DIAGNOSIS — R109 Unspecified abdominal pain: Secondary | ICD-10-CM | POA: Insufficient documentation

## 2020-07-24 DIAGNOSIS — Z87891 Personal history of nicotine dependence: Secondary | ICD-10-CM | POA: Diagnosis not present

## 2020-07-24 DIAGNOSIS — O3680X0 Pregnancy with inconclusive fetal viability, not applicable or unspecified: Secondary | ICD-10-CM | POA: Diagnosis not present

## 2020-07-24 DIAGNOSIS — O26891 Other specified pregnancy related conditions, first trimester: Secondary | ICD-10-CM

## 2020-07-24 LAB — URINALYSIS, ROUTINE W REFLEX MICROSCOPIC
Bilirubin Urine: NEGATIVE
Glucose, UA: NEGATIVE mg/dL
Hgb urine dipstick: NEGATIVE
Ketones, ur: NEGATIVE mg/dL
Leukocytes,Ua: NEGATIVE
Nitrite: NEGATIVE
Protein, ur: NEGATIVE mg/dL
Specific Gravity, Urine: 1.024 (ref 1.005–1.030)
pH: 6 (ref 5.0–8.0)

## 2020-07-24 NOTE — Discharge Instructions (Signed)
First Trimester of Pregnancy The first trimester of pregnancy is from week 1 until the end of week 13 (months 1 through 3). A week after a sperm fertilizes an egg, the egg will implant on the wall of the uterus. This embryo will begin to develop into a baby. Genes from you and your partner will form the baby. The female genes will determine whether the baby will be a boy or a girl. At 6-8 weeks, the eyes and face will be formed, and the heartbeat can be seen on ultrasound. At the end of 12 weeks, all the baby's organs will be formed. Now that you are pregnant, you will want to do everything you can to have a healthy baby. Two of the most important things are to get good prenatal care and to follow your health care provider's instructions. Prenatal care is all the medical care you receive before the baby's birth. This care will help prevent, find, and treat any problems during the pregnancy and childbirth. Body changes during your first trimester Your body goes through many changes during pregnancy. The changes vary from woman to woman.  You may gain or lose a couple of pounds at first.  You may feel sick to your stomach (nauseous) and you may throw up (vomit). If the vomiting is uncontrollable, call your health care provider.  You may tire easily.  You may develop headaches that can be relieved by medicines. All medicines should be approved by your health care provider.  You may urinate more often. Painful urination may mean you have a bladder infection.  You may develop heartburn as a result of your pregnancy.  You may develop constipation because certain hormones are causing the muscles that push stool through your intestines to slow down.  You may develop hemorrhoids or swollen veins (varicose veins).  Your breasts may begin to grow larger and become tender. Your nipples may stick out more, and the tissue that surrounds them (areola) may become darker.  Your gums may bleed and may be  sensitive to brushing and flossing.  Dark spots or blotches (chloasma, mask of pregnancy) may develop on your face. This will likely fade after the baby is born.  Your menstrual periods will stop.  You may have a loss of appetite.  You may develop cravings for certain kinds of food.  You may have changes in your emotions from day to day, such as being excited to be pregnant or being concerned that something may go wrong with the pregnancy and baby.  You may have more vivid and strange dreams.  You may have changes in your hair. These can include thickening of your hair, rapid growth, and changes in texture. Some women also have hair loss during or after pregnancy, or hair that feels dry or thin. Your hair will most likely return to normal after your baby is born. What to expect at prenatal visits During a routine prenatal visit:  You will be weighed to make sure you and the baby are growing normally.  Your blood pressure will be taken.  Your abdomen will be measured to track your baby's growth.  The fetal heartbeat will be listened to between weeks 10 and 14 of your pregnancy.  Test results from any previous visits will be discussed. Your health care provider may ask you:  How you are feeling.  If you are feeling the baby move.  If you have had any abnormal symptoms, such as leaking fluid, bleeding, severe headaches, or abdominal   cramping.  If you are using any tobacco products, including cigarettes, chewing tobacco, and electronic cigarettes.  If you have any questions. Other tests that may be performed during your first trimester include:  Blood tests to find your blood type and to check for the presence of any previous infections. The tests will also be used to check for low iron levels (anemia) and protein on red blood cells (Rh antibodies). Depending on your risk factors, or if you previously had diabetes during pregnancy, you may have tests to check for high blood sugar  that affects pregnant women (gestational diabetes).  Urine tests to check for infections, diabetes, or protein in the urine.  An ultrasound to confirm the proper growth and development of the baby.  Fetal screens for spinal cord problems (spina bifida) and Down syndrome.  HIV (human immunodeficiency virus) testing. Routine prenatal testing includes screening for HIV, unless you choose not to have this test.  You may need other tests to make sure you and the baby are doing well. Follow these instructions at home: Medicines  Follow your health care provider's instructions regarding medicine use. Specific medicines may be either safe or unsafe to take during pregnancy.  Take a prenatal vitamin that contains at least 600 micrograms (mcg) of folic acid.  If you develop constipation, try taking a stool softener if your health care provider approves. Eating and drinking   Eat a balanced diet that includes fresh fruits and vegetables, whole grains, good sources of protein such as meat, eggs, or tofu, and low-fat dairy. Your health care provider will help you determine the amount of weight gain that is right for you.  Avoid raw meat and uncooked cheese. These carry germs that can cause birth defects in the baby.  Eating four or five small meals rather than three large meals a day may help relieve nausea and vomiting. If you start to feel nauseous, eating a few soda crackers can be helpful. Drinking liquids between meals, instead of during meals, also seems to help ease nausea and vomiting.  Limit foods that are high in fat and processed sugars, such as fried and sweet foods.  To prevent constipation: ? Eat foods that are high in fiber, such as fresh fruits and vegetables, whole grains, and beans. ? Drink enough fluid to keep your urine clear or pale yellow. Activity  Exercise only as directed by your health care provider. Most women can continue their usual exercise routine during  pregnancy. Try to exercise for 30 minutes at least 5 days a week. Exercising will help you: ? Control your weight. ? Stay in shape. ? Be prepared for labor and delivery.  Experiencing pain or cramping in the lower abdomen or lower back is a good sign that you should stop exercising. Check with your health care provider before continuing with normal exercises.  Try to avoid standing for long periods of time. Move your legs often if you must stand in one place for a long time.  Avoid heavy lifting.  Wear low-heeled shoes and practice good posture.  You may continue to have sex unless your health care provider tells you not to. Relieving pain and discomfort  Wear a good support bra to relieve breast tenderness.  Take warm sitz baths to soothe any pain or discomfort caused by hemorrhoids. Use hemorrhoid cream if your health care provider approves.  Rest with your legs elevated if you have leg cramps or low back pain.  If you develop varicose veins in   your legs, wear support hose. Elevate your feet for 15 minutes, 3-4 times a day. Limit salt in your diet. Prenatal care  Schedule your prenatal visits by the twelfth week of pregnancy. They are usually scheduled monthly at first, then more often in the last 2 months before delivery.  Write down your questions. Take them to your prenatal visits.  Keep all your prenatal visits as told by your health care provider. This is important. Safety  Wear your seat belt at all times when driving.  Make a list of emergency phone numbers, including numbers for family, friends, the hospital, and police and fire departments. General instructions  Ask your health care provider for a referral to a local prenatal education class. Begin classes no later than the beginning of month 6 of your pregnancy.  Ask for help if you have counseling or nutritional needs during pregnancy. Your health care provider can offer advice or refer you to specialists for help  with various needs.  Do not use hot tubs, steam rooms, or saunas.  Do not douche or use tampons or scented sanitary pads.  Do not cross your legs for long periods of time.  Avoid cat litter boxes and soil used by cats. These carry germs that can cause birth defects in the baby and possibly loss of the fetus by miscarriage or stillbirth.  Avoid all smoking, herbs, alcohol, and medicines not prescribed by your health care provider. Chemicals in these products affect the formation and growth of the baby.  Do not use any products that contain nicotine or tobacco, such as cigarettes and e-cigarettes. If you need help quitting, ask your health care provider. You may receive counseling support and other resources to help you quit.  Schedule a dentist appointment. At home, brush your teeth with a soft toothbrush and be gentle when you floss. Contact a health care provider if:  You have dizziness.  You have mild pelvic cramps, pelvic pressure, or nagging pain in the abdominal area.  You have persistent nausea, vomiting, or diarrhea.  You have a bad smelling vaginal discharge.  You have pain when you urinate.  You notice increased swelling in your face, hands, legs, or ankles.  You are exposed to fifth disease or chickenpox.  You are exposed to German measles (rubella) and have never had it. Get help right away if:  You have a fever.  You are leaking fluid from your vagina.  You have spotting or bleeding from your vagina.  You have severe abdominal cramping or pain.  You have rapid weight gain or loss.  You vomit blood or material that looks like coffee grounds.  You develop a severe headache.  You have shortness of breath.  You have any kind of trauma, such as from a fall or a car accident. Summary  The first trimester of pregnancy is from week 1 until the end of week 13 (months 1 through 3).  Your body goes through many changes during pregnancy. The changes vary from  woman to woman.  You will have routine prenatal visits. During those visits, your health care provider will examine you, discuss any test results you may have, and talk with you about how you are feeling. This information is not intended to replace advice given to you by your health care provider. Make sure you discuss any questions you have with your health care provider. Document Revised: 09/23/2017 Document Reviewed: 09/22/2016 Elsevier Patient Education  2020 [employer] Inc.  

## 2020-07-24 NOTE — MAU Note (Signed)
Pt reports cramping that started a little after 1000. Pt reports taking tylenol and it helped the first time, but not the second time.   Denies vaginal bleeding.

## 2020-07-24 NOTE — MAU Provider Note (Signed)
Chief Complaint: Abdominal Pain   First Provider Initiated Contact with Patient 07/24/20 2057        SUBJECTIVE HPI: Andrea Jenkins is a 24 y.o. G3P2002 at [redacted]w[redacted]d by LMP who presents to maternity admissions reporting pelvic cramping since earlier today.  No bleeding.  . She denies vaginal bleeding, vaginal itching/burning, urinary symptoms, h/a, dizziness, n/v, or fever/chills.   HCG levels rose appropriately this week.  Scheduled for [place] next week  Abdominal Pain This is a new problem. The current episode started today. The problem occurs constantly. The problem has been unchanged. The pain is located in the LLQ, RLQ and suprapubic region. The quality of the pain is cramping. The abdominal pain does not radiate. Pertinent negatives include no constipation, diarrhea, dysuria, fever, myalgias, nausea or vomiting. Nothing aggravates the pain. The pain is relieved by nothing. She has tried acetaminophen for the symptoms. The treatment provided no relief.   RN Note: Pt reports cramping that started a little after 1000. Pt reports taking tylenol and it helped the first time, but not the second time.  Denies vaginal bleeding.  Past Medical History:  Diagnosis Date  . Anxiety   . Asthma    pt states she doesn't use inhaler much (04/10/19)  . Depression   . Gonorrhea   . Heart murmur    when younger  . Vaginal Pap smear, abnormal    Past Surgical History:  Procedure Laterality Date  . CATARACT EXTRACTION    . WISDOM TOOTH EXTRACTION    . WRIST SURGERY     Social History   Socioeconomic History  . Marital status: Single    Spouse name: Not on file  . Number of children: Not on file  . Years of education: Not on file  . Highest education level: Not on file  Occupational History  . Not on file  Tobacco Use  . Smoking status: Former Smoker    Packs/day: 0.25    Types: Cigars    Quit date: 03/11/2017    Years since quitting: 3.3  . Smokeless tobacco: Never Used  Vaping Use  .  Vaping Use: Never used  Substance and Sexual Activity  . Alcohol use: No    Alcohol/week: 0.0 standard drinks    Comment: mimosa on saturday. 04/06/19  . Drug use: Not Currently    Types: Marijuana  . Sexual activity: Yes    Partners: Male    Birth control/protection: None  Other Topics Concern  . Not on file  Social History Narrative  . Not on file   Social Determinants of Health   Financial Resource Strain:   . Difficulty of Paying Living Expenses: Not on file  Food Insecurity:   . Worried About Programme researcher, broadcasting/film/video in the Last Year: Not on file  . Ran Out of Food in the Last Year: Not on file  Transportation Needs:   . Lack of Transportation (Medical): Not on file  . Lack of Transportation (Non-Medical): Not on file  Physical Activity:   . Days of Exercise per Week: Not on file  . Minutes of Exercise per Session: Not on file  Stress:   . Feeling of Stress : Not on file  Social Connections:   . Frequency of Communication with Friends and Family: Not on file  . Frequency of Social Gatherings with Friends and Family: Not on file  . Attends Religious Services: Not on file  . Active Member of Clubs or Organizations: Not on file  .  Attends Banker Meetings: Not on file  . Marital Status: Not on file  Intimate Partner Violence:   . Fear of Current or Ex-Partner: Not on file  . Emotionally Abused: Not on file  . Physically Abused: Not on file  . Sexually Abused: Not on file   No current facility-administered medications on file prior to encounter.   Current Outpatient Medications on File Prior to Encounter  Medication Sig Dispense Refill  . Prenat-Fe Poly-Methfol-FA-DHA (VITAFOL ULTRA) 29-0.6-0.4-200 MG CAPS Take 1 tablet by mouth daily. 30 capsule 12  . acetaminophen (TYLENOL) 325 MG tablet Take 2 tablets (650 mg total) by mouth every 4 (four) hours as needed (for pain scale < 4). (Patient not taking: Reported on 01/15/2020) 30 tablet 0  . buPROPion (WELLBUTRIN)  75 MG tablet Take 1 tablet (75 mg total) by mouth daily. 30 tablet 2  . metoCLOPramide (REGLAN) 10 MG tablet Take 1 tablet (10 mg total) by mouth every 8 (eight) hours as needed for nausea. 30 tablet 0  . [DISCONTINUED] omeprazole (PRILOSEC) 20 MG capsule Take 1 capsule (20 mg total) by mouth 2 (two) times daily before a meal. (Patient not taking: Reported on 08/31/2019) 60 capsule 5   Allergies  Allergen Reactions  . Peach [Prunus Persica] Hives    I have reviewed patient's Past Medical Hx, Surgical Hx, Family Hx, Social Hx, medications and allergies.   ROS:  Review of Systems  Constitutional: Negative for fever.  Gastrointestinal: Positive for abdominal pain. Negative for constipation, diarrhea, nausea and vomiting.  Genitourinary: Negative for dysuria.  Musculoskeletal: Negative for myalgias.   Review of Systems  Other systems negative   Physical Exam  Physical Exam No data found. Constitutional: Well-developed, well-nourished female in no acute distress.  Cardiovascular: normal rate Respiratory: normal effort GI: Abd soft, non-tender. Pos BS x 4 MS: Extremities nontender, no edema, normal ROM Neurologic: Alert and oriented x 4.  GU: Neg CVAT.  PELVIC EXAM: Cervix pink, visually closed, without lesion, scant white creamy discharge, vaginal walls and external genitalia normal Bimanual exam: Cervix 0/long/high, firm, anterior, neg CMT, uterus nontender, nonenlarged, adnexa without tenderness, enlargement, or mass   LAB RESULTS Results for orders placed or performed during the hospital encounter of 07/24/20 (from the past 24 hour(s))  Urinalysis, Routine w reflex microscopic Urine, Clean Catch     Status: Abnormal   Collection Time: 07/24/20  9:03 PM  Result Value Ref Range   Color, Urine YELLOW YELLOW   APPearance HAZY (A) CLEAR   Specific Gravity, Urine 1.024 1.005 - 1.030   pH 6.0 5.0 - 8.0   Glucose, UA NEGATIVE NEGATIVE mg/dL   Hgb urine dipstick NEGATIVE NEGATIVE    Bilirubin Urine NEGATIVE NEGATIVE   Ketones, ur NEGATIVE NEGATIVE mg/dL   Protein, ur NEGATIVE NEGATIVE mg/dL   Nitrite NEGATIVE NEGATIVE   Leukocytes,Ua NEGATIVE NEGATIVE    --/--/A POS, A POS Performed at [facility], [address], [place], [place] [address]  [phone])  IMAGING US OB Transvaginal  Result Date: 07/24/2020 CLINICAL DATA:  Cramping since 10:00, no vaginal bleeding EXAM: TRANSVAGINAL OB ULTRASOUND TECHNIQUE: Transvaginal ultrasound was performed for complete evaluation of the gestation as well as the maternal uterus, adnexal regions, and pelvic cul-de-sac. COMPARISON:  Ultrasound 07/12/2020 FINDINGS: Intrauterine gestational sac: Single Yolk sac:  Visualized. Embryo:  Not Visualized. Cardiac Activity: Not Visualized. MSD: 12.6 mm   6 w   0 d Subchorionic hemorrhage:  None visualized. Maternal uterus/adnexae: Normal anteverted uterus.  Probable corpus luteum seen in the right ovary. No other concerning adnexal lesions. No free fluid in the pelvis. IMPRESSION: Single intrauterine gestation with visible yolk sac but no fetal pole or cardiac activity is identified. Estimated gestational age of [redacted] weeks, 0 days by MSD sonographic estimation. Dating is discordant with prior sonographic measurement 12 days prior (7 weeks 2 days). Furthermore, absence of an embryo 1-2 weeks after a scanned demonstrating gestational sac without yolk sac is suspicious but not yet definitive for failed pregnancy. Recommend follow-up US in 10-14 days for definitive diagnosis. This recommendation follows SRU consensus guidelines: Diagnostic Criteria for Nonviable Pregnancy Early in the First Trimester. Malva Limes Med 2013; 782:9562-13. Electronically Signed   By: Kreg Shropshire M.D.   On: 07/24/2020 21:32     MAU Management/MDM: Ordered Ultrasound to rule out ectopic.  This bleeding/pain can represent a normal pregnancy with bleeding, spontaneous abortion or even an ectopic which can be  life-threatening.  The process as listed above helps to determine which of these is present.  Discussed there is progression with GS and Yolk Sac but that there is no embryo yet.  Discussed this may reflect an abnormal pregnancy but it is too early to say for sure. Recommend repeat US in a week to 10 days.   ASSESSMENT 1. Pregnancy of unknown anatomic location   2. Pelvic pain affecting pregnancy in first trimester, antepartum     PLAN Discharge home Will repeat  Ultrasound in about 7-10 days Ectopic precautions  Pt stable at time of discharge. Encouraged to return here or to other Urgent Care/ED if she develops worsening of symptoms, increase in pain, fever, or other concerning symptoms.    Wynelle Bourgeois CNM, MSN Certified Nurse-Midwife 07/24/2020  8:57 PM

## 2020-07-26 ENCOUNTER — Encounter (HOSPITAL_COMMUNITY): Payer: Self-pay | Admitting: Obstetrics and Gynecology

## 2020-07-26 ENCOUNTER — Inpatient Hospital Stay (HOSPITAL_COMMUNITY)
Admission: AD | Admit: 2020-07-26 | Discharge: 2020-07-26 | Disposition: A | Discharge status: Home / Self Care | Payer: Medicaid Other | Attending: Obstetrics and Gynecology | Admitting: Obstetrics and Gynecology

## 2020-07-26 ENCOUNTER — Other Ambulatory Visit: Payer: Self-pay

## 2020-07-26 DIAGNOSIS — F419 Anxiety disorder, unspecified: Secondary | ICD-10-CM | POA: Diagnosis not present

## 2020-07-26 DIAGNOSIS — Z79899 Other long term (current) drug therapy: Secondary | ICD-10-CM | POA: Diagnosis not present

## 2020-07-26 DIAGNOSIS — O99341 Other mental disorders complicating pregnancy, first trimester: Secondary | ICD-10-CM | POA: Insufficient documentation

## 2020-07-26 DIAGNOSIS — Z87891 Personal history of nicotine dependence: Secondary | ICD-10-CM | POA: Insufficient documentation

## 2020-07-26 DIAGNOSIS — R109 Unspecified abdominal pain: Secondary | ICD-10-CM | POA: Diagnosis not present

## 2020-07-26 DIAGNOSIS — Z3A01 Less than 8 weeks gestation of pregnancy: Secondary | ICD-10-CM | POA: Diagnosis not present

## 2020-07-26 DIAGNOSIS — O26891 Other specified pregnancy related conditions, first trimester: Secondary | ICD-10-CM | POA: Insufficient documentation

## 2020-07-26 MED ORDER — ACETAMINOPHEN 500 MG PO TABS: 1000.0000 mg | ORAL_TABLET | Freq: Once | ORAL | Status: DC

## 2020-07-26 NOTE — MAU Note (Signed)
Andrea Jenkins is a 24 y.o. at [redacted]w[redacted]d here in MAU reporting:  Worsening lower abdominal cramping constant Onset of complaint: ongoing; was seen in MAU a couple days ago for the cramping Pain score: 6/10 States she has been working all day and it has gotten worse. Has not taken anything for the pain today. Vitals:   07/26/20 1707  BP: 118/60  Pulse: 79  Resp: 18  Temp: 98.1 F (36.7 C)  SpO2: 100%      Lab orders placed from triage: none

## 2020-07-26 NOTE — Discharge Instructions (Signed)

## 2020-07-26 NOTE — MAU Provider Note (Signed)
History     CSN: [id number]  Arrival date and time: 07/26/20 1634   Chief Complaint  Patient presents with   Abdominal Pain   HPI Andrea Jenkins is a 24 y.o. G3P2002 at [redacted]w[redacted]d who presents with abdominal cramping. She states it is the same as it was when she was seen on 9/30. She states it is not better or worse. She rates the pain a 5/10 and has not taken anything for the pain. She states she was at work all day and feels like that's why she is cramping. She denies any bleeding or discharge.   OB History    Gravida  3   Para  2   Term  2   Preterm  0   AB  0   Living  2     SAB  0   TAB  0   Ectopic  0   Multiple  0   Live Births  2           Past Medical History:  Diagnosis Date   Anxiety    Asthma    pt states she doesn't use inhaler much (04/10/19)   Depression    Gonorrhea    Heart murmur    when younger   Vaginal Pap smear, abnormal     Past Surgical History:  Procedure Laterality Date   CATARACT EXTRACTION     WISDOM TOOTH EXTRACTION     WRIST SURGERY      Family History  Problem Relation Age of Onset   Hypertension Mother    Miscarriages / [place] Mother    Hypertension Maternal Grandmother    Asthma Maternal Grandmother    Diabetes Maternal Grandmother    Vision loss Maternal Grandmother    Mental illness Brother    Cancer Maternal Aunt    Mental illness Sister    Heart disease Neg Hx     Social History   Tobacco Use   Smoking status: Former Smoker    Packs/day: 0.25    Types: Cigars    Quit date: 03/11/2017    Years since quitting: 3.3   Smokeless tobacco: Never Used  Vaping Use   Vaping Use: Never used  Substance Use Topics   Alcohol use: No    Alcohol/week: 0.0 standard drinks    Comment: mimosa on saturday. 04/06/19   Drug use: Not Currently    Types: Marijuana    Allergies:  Allergies  Allergen Reactions   Peach [Prunus Persica] Hives    Medications Prior to Admission   Medication Sig Dispense Refill Last Dose   acetaminophen (TYLENOL) 325 MG tablet Take 2 tablets (650 mg total) by mouth every 4 (four) hours as needed (for pain scale < 4). (Patient not taking: Reported on 01/15/2020) 30 tablet 0    buPROPion (WELLBUTRIN) 75 MG tablet Take 1 tablet (75 mg total) by mouth daily. 30 tablet 2    metoCLOPramide (REGLAN) 10 MG tablet Take 1 tablet (10 mg total) by mouth every 8 (eight) hours as needed for nausea. 30 tablet 0    Prenat-Fe Poly-Methfol-FA-DHA (VITAFOL ULTRA) 29-0.6-0.4-200 MG CAPS Take 1 tablet by mouth daily. 30 capsule 12     Review of Systems  Constitutional: Negative.  Negative for fatigue and fever.  HENT: Negative.   Respiratory: Negative.  Negative for shortness of breath.   Cardiovascular: Negative.  Negative for chest pain.  Gastrointestinal: Positive for abdominal pain. Negative for constipation, diarrhea, nausea and vomiting.  Genitourinary: Negative.  Negative for dysuria, vaginal bleeding and vaginal discharge.  Neurological: Negative.  Negative for dizziness and headaches.   Physical Exam   Blood pressure 118/60, pulse 79, temperature 98.1 F (36.7 C), temperature source Oral, resp. rate 18, last menstrual period 06/13/2020, SpO2 100 %, not currently breastfeeding.  Physical Exam Vitals and nursing note reviewed.  Constitutional:      General: She is not in acute distress.    Appearance: She is well-developed.  HENT:     Head: Normocephalic.  Eyes:     Pupils: Pupils are equal, round, and reactive to light.  Cardiovascular:     Rate and Rhythm: Normal rate and regular rhythm.     Heart sounds: Normal heart sounds.  Pulmonary:     Effort: Pulmonary effort is normal. No respiratory distress.     Breath sounds: Normal breath sounds.  Abdominal:     General: Bowel sounds are normal. There is no distension. There are no signs of injury.     Palpations: Abdomen is soft. There is no mass.     Tenderness: There is no  abdominal tenderness.  Skin:    General: Skin is warm and dry.  Neurological:     Mental Status: She is alert and oriented to person, place, and time.  Psychiatric:        Behavior: Behavior normal.        Thought Content: Thought content normal.        Judgment: Judgment normal.     MAU Course  Procedures  MDM Vital signs stable. Abdomen soft, non tender. Pain is unchanged from previous evaluation.   Discussed that not enough time has elapsed to see any changes on ultrasound in the pregnancy even though patient is eager to have more information. Discussed hydration while at work and offered work note, patient declined.   Offered tylenol PO- patient declined and states she can take some at home  Offered repeat pelvic exam- patient declined  Assessment and Plan   1. Abdominal cramping affecting pregnancy   2. [redacted] weeks gestation of pregnancy    -Discharge home in stable condition -Abdominal pain and vaginal bleeding precautions discussed -Patient advised to follow-up with OB as scheduled on 10/11 for repeat ultrasound -Patient may return to MAU as needed or if her condition were to change or worsen   Rolm Bookbinder CNM 07/26/2020, 5:15 PM

## 2020-07-29 ENCOUNTER — Other Ambulatory Visit: Payer: Self-pay

## 2020-07-29 ENCOUNTER — Ambulatory Visit
Admission: RE | Admit: 2020-07-29 | Discharge: 2020-07-29 | Disposition: A | Discharge status: Home / Self Care | Payer: Medicaid Other | Source: Ambulatory Visit | Attending: Obstetrics & Gynecology | Admitting: Obstetrics & Gynecology

## 2020-07-29 ENCOUNTER — Ambulatory Visit: Payer: Medicaid Other

## 2020-07-29 DIAGNOSIS — O3680X0 Pregnancy with inconclusive fetal viability, not applicable or unspecified: Secondary | ICD-10-CM | POA: Insufficient documentation

## 2020-07-29 DIAGNOSIS — Z3A01 Less than 8 weeks gestation of pregnancy: Secondary | ICD-10-CM | POA: Diagnosis not present

## 2020-07-30 ENCOUNTER — Telehealth (INDEPENDENT_AMBULATORY_CARE_PROVIDER_SITE_OTHER): Payer: Medicaid Other | Admitting: Obstetrics

## 2020-07-30 ENCOUNTER — Encounter: Payer: Self-pay | Admitting: Obstetrics

## 2020-07-30 DIAGNOSIS — Z3A01 Less than 8 weeks gestation of pregnancy: Secondary | ICD-10-CM

## 2020-07-30 DIAGNOSIS — Z331 Pregnant state, incidental: Secondary | ICD-10-CM | POA: Diagnosis not present

## 2020-07-30 DIAGNOSIS — Z712 Person consulting for explanation of examination or test findings: Secondary | ICD-10-CM

## 2020-07-30 MED ORDER — VITAFOL ULTRA 29-0.6-0.4-200 MG PO CAPS: 1.0000 | ORAL_CAPSULE | Freq: Every day | ORAL | 12 refills | Status: DC

## 2020-07-30 NOTE — Progress Notes (Signed)
GYNECOLOGY VIRTUAL VISIT ENCOUNTER NOTE  Provider location: [facility] for [facility] [place]   I connected with Andrea Jenkins on 07/30/20 at 10:00 AM EDT by MyChart Video Encounter at home and verified that I am speaking with the correct person using two identifiers.   I discussed the limitations, risks, security and privacy concerns of performing an evaluation and management service virtually and the availability of in person appointments. I also discussed with the patient that there may be a patient responsible charge related to this service. The patient expressed understanding and agreed to proceed.   History:  Andrea Jenkins is a 24 y.o. G4P2002 female being evaluated today for ultrasound results for pregnancy viability. She denies any abnormal vaginal discharge, bleeding, pelvic pain or other concerns.       Past Medical History:  Diagnosis Date  . Anxiety   . Asthma    pt states she doesn't use inhaler much (04/10/19)  . Depression   . Gonorrhea   . Heart murmur    when younger  . Vaginal Pap smear, abnormal    Past Surgical History:  Procedure Laterality Date  . CATARACT EXTRACTION    . WISDOM TOOTH EXTRACTION    . WRIST SURGERY     The following portions of the patient's history were reviewed and updated as appropriate: allergies, current medications, past family history, past medical history, past social history, past surgical history and problem list.    Review of Systems:  Pertinent items noted in HPI and remainder of comprehensive ROS otherwise negative.  Physical Exam:   General:  Alert, oriented and cooperative. Patient appears to be in no acute distress.  Mental Status: Normal mood and affect. Normal behavior. Normal judgment and thought content.   Respiratory: Normal respiratory effort, no problems with respiration noted  Rest of physical exam deferred due to type of encounter  Labs and Imaging Results for orders placed or performed during  the hospital encounter of 07/24/20 (from the past 336 hour(s))  Urinalysis, Routine w reflex microscopic Urine, Clean Catch   Collection Time: 07/24/20  9:03 PM  Result Value Ref Range   Color, Urine YELLOW YELLOW   APPearance HAZY (A) CLEAR   Specific Gravity, Urine 1.024 1.005 - 1.030   pH 6.0 5.0 - 8.0   Glucose, UA NEGATIVE NEGATIVE mg/dL   Hgb urine dipstick NEGATIVE NEGATIVE   Bilirubin Urine NEGATIVE NEGATIVE   Ketones, ur NEGATIVE NEGATIVE mg/dL   Protein, ur NEGATIVE NEGATIVE mg/dL   Nitrite NEGATIVE NEGATIVE   Leukocytes,Ua NEGATIVE NEGATIVE  Results for orders placed or performed in visit on 07/21/20 (from the past 336 hour(s))  Beta hCG quant (ref lab)   Collection Time: 07/21/20  9:06 AM  Result Value Ref Range   hCG Quant 4,612 mIU/mL   US OB Transvaginal  Result Date: 07/29/2020 CLINICAL DATA:  First trimester pregnancy, assess viability EXAM: TRANSVAGINAL OB ULTRASOUND TECHNIQUE: Transvaginal ultrasound was performed for complete evaluation of the gestation as well as the maternal uterus, adnexal regions, and pelvic cul-de-sac. COMPARISON:  07/24/2020 FINDINGS: Intrauterine gestational sac: Present, single Yolk sac:  Present Embryo:  Present Cardiac Activity: Present Heart Rate: 1138 bpm CRL:   5.5 mm   6 w 2 d                  [place] EDC: 03/22/2020 Subchorionic hemorrhage:  None visualized. Maternal uterus/adnexae: Maternal uterus and ovaries otherwise unremarkable. Trace free pelvic fluid. No adnexal masses. IMPRESSION: Single  live intrauterine gestation at 6 weeks 2 days EGA by crown-rump length. No acute abnormalities. Electronically Signed   By: Ulyses Southward M.D.   On: 07/29/2020 09:40   US OB Transvaginal  Result Date: 07/24/2020 CLINICAL DATA:  Cramping since 10:00, no vaginal bleeding EXAM: TRANSVAGINAL OB ULTRASOUND TECHNIQUE: Transvaginal ultrasound was performed for complete evaluation of the gestation as well as the maternal uterus, adnexal regions, and pelvic  cul-de-sac. COMPARISON:  Ultrasound 07/12/2020 FINDINGS: Intrauterine gestational sac: Single Yolk sac:  Visualized. Embryo:  Not Visualized. Cardiac Activity: Not Visualized. MSD: 12.6 mm   6 w   0 d Subchorionic hemorrhage:  None visualized. Maternal uterus/adnexae: Normal anteverted uterus. Probable corpus luteum seen in the right ovary. No other concerning adnexal lesions. No free fluid in the pelvis. IMPRESSION: Single intrauterine gestation with visible yolk sac but no fetal pole or cardiac activity is identified. Estimated gestational age of [redacted] weeks, 0 days by MSD sonographic estimation. Dating is discordant with prior sonographic measurement 12 days prior (7 weeks 2 days). Furthermore, absence of an embryo 1-2 weeks after a scanned demonstrating gestational sac without yolk sac is suspicious but not yet definitive for failed pregnancy. Recommend follow-up US in 10-14 days for definitive diagnosis. This recommendation follows SRU consensus guidelines: Diagnostic Criteria for Nonviable Pregnancy Early in the First Trimester. Malva Limes Med 2013; 161:0960-45. Electronically Signed   By: Kreg Shropshire M.D.   On: 07/24/2020 21:32   US OB LESS THAN 14 WEEKS WITH OB TRANSVAGINAL  Result Date: 07/12/2020 CLINICAL DATA:  Abdominal pain with nausea. EXAM: OBSTETRIC <14 WK [place] AND TRANSVAGINAL OB US TECHNIQUE: Transvaginal ultrasound was performed for complete evaluation of the gestation as well as the maternal uterus, adnexal regions, and pelvic cul-de-sac. COMPARISON:  None. FINDINGS: Intrauterine gestational sac: Single Yolk sac:  No Embryo:  No MSD: 8.3  mm   5 w   4 d Maternal uterus/adnexae: Subchorionic hemorrhage: None Right ovary: Normal Left ovary: Normal Other :None Free fluid:  Small volume of free fluid noted within the cul-de-sac. IMPRESSION: Probable early intrauterine gestational sac, but no yolk sac, fetal pole, or cardiac activity yet visualized. Recommend follow-up quantitative B-HCG levels and  follow-up US in 14 days to assess viability. This recommendation follows SRU consensus guidelines: Diagnostic Criteria for Nonviable Pregnancy Early in the First Trimester. Malva Limes Med 2013; 409:8119-14. Electronically Signed   By: Signa Kell M.D.   On: 07/12/2020 11:47       Assessment and Plan:     1. [redacted] weeks gestation of pregnancy Rx: - Prenat-Fe Poly-Methfol-FA-DHA (VITAFOL ULTRA) 29-0.6-0.4-200 MG CAPS; Take 1 tablet by mouth daily.  Dispense: 30 capsule; Refill: 12 - follow up in 4 weeks for NOB visit   I discussed the assessment and treatment plan with the patient. The patient was provided an opportunity to ask questions and all were answered. The patient agreed with the plan and demonstrated an understanding of the instructions.   The patient was advised to call back or seek an in-person evaluation/go to the ED if the symptoms worsen or if the condition fails to improve as anticipated.  I provided 15 minutes of face-to-face time during this encounter.   Coral Ceo, MD [facility] for [facility], [facility] 07/30/20

## 2020-07-30 NOTE — Progress Notes (Signed)
[place] follow up visit.   [place] 07/29/20 shows [redacted]w[redacted]d single live intrauterine gestation, EDD 03/22/21

## 2020-08-03 ENCOUNTER — Encounter (HOSPITAL_COMMUNITY): Payer: Self-pay | Admitting: Obstetrics & Gynecology

## 2020-08-03 ENCOUNTER — Inpatient Hospital Stay (HOSPITAL_COMMUNITY)
Admission: AD | Admit: 2020-08-03 | Discharge: 2020-08-03 | Disposition: A | Discharge status: Home / Self Care | Payer: Medicaid Other | Attending: Obstetrics & Gynecology | Admitting: Obstetrics & Gynecology

## 2020-08-03 DIAGNOSIS — O218 Other vomiting complicating pregnancy: Secondary | ICD-10-CM | POA: Diagnosis not present

## 2020-08-03 DIAGNOSIS — F419 Anxiety disorder, unspecified: Secondary | ICD-10-CM | POA: Diagnosis not present

## 2020-08-03 DIAGNOSIS — O210 Mild hyperemesis gravidarum: Secondary | ICD-10-CM | POA: Insufficient documentation

## 2020-08-03 DIAGNOSIS — Z87891 Personal history of nicotine dependence: Secondary | ICD-10-CM | POA: Diagnosis not present

## 2020-08-03 DIAGNOSIS — Z79899 Other long term (current) drug therapy: Secondary | ICD-10-CM | POA: Insufficient documentation

## 2020-08-03 DIAGNOSIS — Z818 Family history of other mental and behavioral disorders: Secondary | ICD-10-CM | POA: Insufficient documentation

## 2020-08-03 DIAGNOSIS — Z8619 Personal history of other infectious and parasitic diseases: Secondary | ICD-10-CM | POA: Insufficient documentation

## 2020-08-03 DIAGNOSIS — F329 Major depressive disorder, single episode, unspecified: Secondary | ICD-10-CM | POA: Insufficient documentation

## 2020-08-03 DIAGNOSIS — Z3A01 Less than 8 weeks gestation of pregnancy: Secondary | ICD-10-CM | POA: Diagnosis not present

## 2020-08-03 DIAGNOSIS — O26891 Other specified pregnancy related conditions, first trimester: Secondary | ICD-10-CM | POA: Insufficient documentation

## 2020-08-03 DIAGNOSIS — O99281 Endocrine, nutritional and metabolic diseases complicating pregnancy, first trimester: Secondary | ICD-10-CM | POA: Diagnosis not present

## 2020-08-03 DIAGNOSIS — E860 Dehydration: Secondary | ICD-10-CM | POA: Insufficient documentation

## 2020-08-03 DIAGNOSIS — O99341 Other mental disorders complicating pregnancy, first trimester: Secondary | ICD-10-CM | POA: Insufficient documentation

## 2020-08-03 DIAGNOSIS — O2691 Pregnancy related conditions, unspecified, first trimester: Secondary | ICD-10-CM

## 2020-08-03 DIAGNOSIS — R109 Unspecified abdominal pain: Secondary | ICD-10-CM | POA: Diagnosis not present

## 2020-08-03 LAB — URINALYSIS, ROUTINE W REFLEX MICROSCOPIC
Bilirubin Urine: NEGATIVE
Glucose, UA: NEGATIVE mg/dL
Hgb urine dipstick: NEGATIVE
Ketones, ur: NEGATIVE mg/dL
Nitrite: NEGATIVE
Protein, ur: NEGATIVE mg/dL
Specific Gravity, Urine: 1.024 (ref 1.005–1.030)
pH: 5 (ref 5.0–8.0)

## 2020-08-03 NOTE — MAU Note (Signed)
Pt reports to mau with c/o lower mid sharp abd pain that is constant.  Pt states it has been going on for the past 2 days, but was at work today and felt like she needed to be checked out.  Pt denies vag bleeding or odor.

## 2020-08-03 NOTE — Discharge Instructions (Signed)
Activity Restriction During Pregnancy Your health care provider may recommend specific activity restrictions during pregnancy for a variety of reasons. Activity restriction may require that you limit activities that require great effort, such as exercise, lifting, or sex. The type of activity restriction will vary for each person, depending on your risk or the problems you are having. Activity restriction may be recommended for a period of time until your baby is delivered. Why are activity restrictions recommended? Activity restriction may be recommended if:  Your placenta is partially or completely covering the opening of your cervix (placenta previa).  There is bleeding between the wall of the uterus and the amniotic sac in the first trimester of pregnancy (subchorionic hemorrhage).  You went into labor too early (preterm labor).  You have a history of miscarriage.  You have a condition that causes high blood pressure during pregnancy (preeclampsia or eclampsia).  You are pregnant with more than one baby.  Your baby is not growing well. What are the risks? The risks depend on your specific restriction. Strict bed rest has the most physical and emotional risks and is no longer routinely recommended. Risks of strict bed rest include:  Loss of muscle conditioning from not moving.  Blood clots.  Social isolation.  Depression.  Loss of income. Talk with your health care team about activity restriction to decide if it is best for you and your baby. Even if you are having problems during your pregnancy, you may be able to continue with normal levels of activity with careful monitoring by your health care team. Follow these instructions at home: If needed, based on your overall health and the health of your baby, your health care provider will decide which type of activity restriction is right for you. Activity restrictions may include:  Not lifting anything heavier than 10 pounds (4.5  kg).  Avoiding activities that take a lot of physical effort.  No lifting or straining.  Resting in a sitting position or lying down for periods of time during the day. Pelvic rest may be recommended along with activity restrictions. If pelvic rest is recommended, then:  Do not have sex, an orgasm, or use sexual stimulation.  Do not use tampons. Do not douche. Do not put anything into your vagina.  Do not lift anything that is heavier than 10 lb (4.5 kg).  Avoid activities that require a lot of effort.  Avoid any activity in which your pelvic muscles could become strained, such as squatting. Questions to ask your health care provider  Why is my activity being limited?  How will activity restrictions affect my body?  Why is rest helpful for me and my baby?  What activities can I do?  When can I return to normal activities? When should I seek immediate medical care? Seek immediate medical care if you have:  Vaginal bleeding.  Vaginal discharge.  Cramping pain in your lower abdomen.  Regular contractions.  A low, dull backache. Summary  Your health care provider may recommend specific activity restrictions during pregnancy for a variety of reasons.  Activity restriction may require that you limit activities such as exercise, lifting, sex, or any other activity that requires great effort.  Discuss the risks and benefits of activity restriction with your health care team to decide if it is best for you and your baby.  Contact your health care provider right away if you think you are having contractions, or if you notice vaginal bleeding, discharge, or cramping. This information is not   intended to replace advice given to you by your health care provider. Make sure you discuss any questions you have with your health care provider. Document Revised: 07/04/2019 Document Reviewed: 01/31/2018 Elsevier Patient Education  2020 [employer] Inc[employer]   Morning Sickness  Morning  sickness is when you feel sick to your stomach (nauseous) during pregnancy. You may feel sick to your stomach and throw up (vomit). You may feel sick in the morning, but you can feel this way at any time of day. Some women feel very sick to their stomach and cannot stop throwing up (hyperemesis gravidarum). Follow these instructions at home: Medicines  Take over-the-counter and prescription medicines only as told by your doctor. Do not take any medicines until you talk with your doctor about them first.  Taking multivitamins before getting pregnant can stop or lessen the harshness of morning sickness. Eating and drinking  Eat dry toast or crackers before getting out of bed.  Eat 5 or 6 small meals a day.  Eat dry and bland foods like rice and baked potatoes.  Do not eat greasy, fatty, or spicy foods.  Have someone cook for you if the smell of food causes you to feel sick or throw up.  If you feel sick to your stomach after taking prenatal vitamins, take them at night or with a snack.  Eat protein when you need a snack. Nuts, yogurt, and cheese are good choices.  Drink fluids throughout the day.  Try ginger ale made with real ginger, ginger tea made from fresh grated ginger, or ginger candies. General instructions  Do not use any products that have nicotine or tobacco in them, such as cigarettes and e-cigarettes. If you need help quitting, ask your doctor.  Use an air purifier to keep the air in your house free of smells.  Get lots of fresh air.  Try to avoid smells that make you feel sick.  Try: ? Wearing a bracelet that is used for seasickness (acupressure wristband). ? Going to a doctor who puts thin needles into certain body points (acupuncture) to improve how you feel. Contact a doctor if:  You need medicine to feel better.  You feel dizzy or light-headed.  You are losing weight. Get help right away if:  You feel very sick to your stomach and cannot stop throwing  up.  You pass out (faint).  You have very bad pain in your belly. Summary  Morning sickness is when you feel sick to your stomach (nauseous) during pregnancy.  You may feel sick in the morning, but you can feel this way at any time of day.  Making some changes to what you eat may help your symptoms go away. This information is not intended to replace advice given to you by your health care provider. Make sure you discuss any questions you have with your health care provider. Document Revised: 09/23/2017 Document Reviewed: 11/11/2016 Elsevier Patient Education  2020 [employer].

## 2020-08-03 NOTE — MAU Provider Note (Signed)
History     CSN: [id number]  Arrival date and time: 08/03/20 1020   First Provider Initiated Contact with Patient 08/03/20 1125      Chief Complaint  Patient presents with  . Abdominal Pain   24 y.o. [id number] @[redacted]w[redacted]d  with confirmed IUP presenting with LAP. Sx started 2 days ago. Describes as sharp cramping, intermittent and center of lower abdomen. Rates pain 7/10. She tried Tylenol but it didn't help. Denies VB. Denies fevers. No constipation or diarrhea. Having nausea but no vomiting. She was given Rx for Reglan but has not picked it up. Denies lifting, pushing, pulling, or injury. She admit to no water intake today, only OJ.   OB History    Gravida  3   Para  2   Term  2   Preterm  0   AB  0   Living  2     SAB  0   TAB  0   Ectopic  0   Multiple  0   Live Births  2           Past Medical History:  Diagnosis Date  . Anxiety   . Asthma    pt states she doesn't use inhaler much (04/10/19)  . Depression   . Gonorrhea   . Heart murmur    when younger  . Vaginal Pap smear, abnormal     Past Surgical History:  Procedure Laterality Date  . CATARACT EXTRACTION    . WISDOM TOOTH EXTRACTION    . WRIST SURGERY      Family History  Problem Relation Age of Onset  . Hypertension Mother   . Miscarriages / 04/12/19 Mother   . Hypertension Maternal Grandmother   . Asthma Maternal Grandmother   . Diabetes Maternal Grandmother   . Vision loss Maternal Grandmother   . Mental illness Brother   . Cancer Maternal Aunt   . Mental illness Sister   . Heart disease Neg Hx     Social History   Tobacco Use  . Smoking status: Former Smoker    Packs/day: 0.25    Types: Cigars    Quit date: 03/11/2017    Years since quitting: 3.4  . Smokeless tobacco: Never Used  Vaping Use  . Vaping Use: Never used  Substance Use Topics  . Alcohol use: No    Alcohol/week: 0.0 standard drinks    Comment: mimosa on saturday. 04/06/19  . Drug use: Not Currently    Types:  Marijuana    Allergies:  Allergies  Allergen Reactions  . Peach [Prunus Persica] Hives    Medications Prior to Admission  Medication Sig Dispense Refill Last Dose  . acetaminophen (TYLENOL) 325 MG tablet Take 2 tablets (650 mg total) by mouth every 4 (four) hours as needed (for pain scale < 4). (Patient not taking: Reported on 01/15/2020) 30 tablet 0   . buPROPion (WELLBUTRIN) 75 MG tablet Take 1 tablet (75 mg total) by mouth daily. 30 tablet 2   . metoCLOPramide (REGLAN) 10 MG tablet Take 1 tablet (10 mg total) by mouth every 8 (eight) hours as needed for nausea. 30 tablet 0   . Prenat-Fe Poly-Methfol-FA-DHA (VITAFOL ULTRA) 29-0.6-0.4-200 MG CAPS Take 1 tablet by mouth daily. 30 capsule 12     Review of Systems  Constitutional: Negative for chills and fever.  Gastrointestinal: Positive for abdominal pain and nausea. Negative for constipation, diarrhea and vomiting.  Genitourinary: Negative for dysuria, frequency, hematuria, urgency, vaginal bleeding and vaginal discharge.  Physical Exam   Blood pressure (!) 129/56, pulse 72, temperature 98.2 F (36.8 C), temperature source Oral, resp. rate 15, last menstrual period 06/13/2020, SpO2 100 %, not currently breastfeeding.  Physical Exam Vitals and nursing note reviewed. Exam conducted with a chaperone present.  Constitutional:      General: She is not in acute distress (appears comfortable).    Appearance: Normal appearance.  HENT:     Head: Normocephalic and atraumatic.  Cardiovascular:     Rate and Rhythm: Normal rate.  Pulmonary:     Effort: Pulmonary effort is normal. No respiratory distress.  Abdominal:     General: There is no distension.     Palpations: Abdomen is soft. There is no mass.     Tenderness: There is no abdominal tenderness. There is no guarding or rebound.  Genitourinary:    Comments: External: no lesions or erythema Uterus: + enlarged, anteverted, non tender, no CMT Adnexae: no masses, no tenderness left,  no tenderness right Cervix closed/long  Musculoskeletal:        General: Normal range of motion.     Cervical back: Normal range of motion.  Skin:    General: Skin is warm and dry.  Neurological:     General: No focal deficit present.     Mental Status: She is alert and oriented to person, place, and time.  Psychiatric:        Mood and Affect: Mood normal.        Behavior: Behavior normal.   Limited bedside US: viable, active fetus, +cardiac activity, subj. nml AFV  Results for orders placed or performed during the hospital encounter of 08/03/20 (from the past 24 hour(s))  Urinalysis, Routine w reflex microscopic Urine, Clean Catch     Status: Abnormal   Collection Time: 08/03/20 11:12 AM  Result Value Ref Range   Color, Urine YELLOW YELLOW   APPearance HAZY (A) CLEAR   Specific Gravity, Urine 1.024 1.005 - 1.030   pH 5.0 5.0 - 8.0   Glucose, UA NEGATIVE NEGATIVE mg/dL   Hgb urine dipstick NEGATIVE NEGATIVE   Bilirubin Urine NEGATIVE NEGATIVE   Ketones, ur NEGATIVE NEGATIVE mg/dL   Protein, ur NEGATIVE NEGATIVE mg/dL   Nitrite NEGATIVE NEGATIVE   Leukocytes,Ua SMALL (A) NEGATIVE   RBC / HPF 0-5 0 - 5 RBC/hpf   WBC, UA 11-20 0 - 5 WBC/hpf   Bacteria, UA RARE (A) NONE SEEN   Squamous Epithelial / LPF 6-10 0 - 5   Mucus PRESENT    MAU Course  Procedures  MDM Labs ordered and reviewed. Recent GC and wet prep negative, not repeated today. Dehydration noted on [place], discussed increasing water intake. UA with WBCs and leuks, will order UC. Viability confirmed with no signs of threatened miscarriage or acute abd process. Pain likely physiologic and discussed comfort measures. Stable for discharge home.  Assessment and Plan   1. [redacted] weeks gestation of pregnancy   2. Dehydration   3. Morning sickness   4. Abdominal pain during pregnancy in first trimester    Discharge home Follow up at [facility] as scheduled Reglan prn- has Rx SAB precautions  Allergies as of 08/03/2020       Reactions   Peach [prunus Persica] Hives      Medication List    TAKE these medications   acetaminophen 325 MG tablet Commonly known as: Tylenol Take 2 tablets (650 mg total) by mouth every 4 (four) hours as needed (for pain scale < 4).  buPROPion 75 MG tablet Commonly known as: WELLBUTRIN Take 1 tablet (75 mg total) by mouth daily.   metoCLOPramide 10 MG tablet Commonly known as: REGLAN Take 1 tablet (10 mg total) by mouth every 8 (eight) hours as needed for nausea.   Vitafol Ultra 29-0.6-0.4-200 MG Caps Take 1 tablet by mouth daily.       Donette Larry, CNM 08/03/2020, 11:40 AM

## 2020-08-04 ENCOUNTER — Ambulatory Visit: Admission: RE | Admit: 2020-08-04 | Payer: Medicaid Other | Source: Ambulatory Visit

## 2020-08-04 LAB — CULTURE, OB URINE

## 2020-08-26 DIAGNOSIS — O0990 Supervision of high risk pregnancy, unspecified, unspecified trimester: Secondary | ICD-10-CM | POA: Insufficient documentation

## 2020-08-27 ENCOUNTER — Ambulatory Visit: Payer: Medicaid Other

## 2020-08-27 DIAGNOSIS — O0990 Supervision of high risk pregnancy, unspecified, unspecified trimester: Secondary | ICD-10-CM

## 2020-08-27 MED ORDER — PREPLUS 27-1 MG PO TABS: 1.0000 | ORAL_TABLET | Freq: Every day | ORAL | 13 refills | Status: DC

## 2020-08-27 MED ORDER — BLOOD PRESSURE MONITOR DELUXE KIT: PACK | 0 refills | Status: DC

## 2020-08-27 NOTE — Progress Notes (Signed)
I have spoken with patient regarding her new pregnancy. All her concerns were address.She will follow up next week and would like to do genetic testing.

## 2020-08-29 ENCOUNTER — Inpatient Hospital Stay (HOSPITAL_COMMUNITY)
Admission: AD | Admit: 2020-08-29 | Discharge: 2020-08-29 | Disposition: A | Discharge status: Home / Self Care | Payer: Medicaid Other | Attending: Obstetrics and Gynecology | Admitting: Obstetrics and Gynecology

## 2020-08-29 ENCOUNTER — Other Ambulatory Visit: Payer: Self-pay

## 2020-08-29 ENCOUNTER — Encounter (HOSPITAL_COMMUNITY): Payer: Self-pay | Admitting: Obstetrics and Gynecology

## 2020-08-29 ENCOUNTER — Inpatient Hospital Stay (HOSPITAL_COMMUNITY): Payer: Medicaid Other

## 2020-08-29 DIAGNOSIS — R399 Unspecified symptoms and signs involving the genitourinary system: Secondary | ICD-10-CM

## 2020-08-29 DIAGNOSIS — Z87891 Personal history of nicotine dependence: Secondary | ICD-10-CM | POA: Diagnosis not present

## 2020-08-29 DIAGNOSIS — Z3A11 11 weeks gestation of pregnancy: Secondary | ICD-10-CM

## 2020-08-29 DIAGNOSIS — O26891 Other specified pregnancy related conditions, first trimester: Secondary | ICD-10-CM | POA: Diagnosis not present

## 2020-08-29 DIAGNOSIS — N390 Urinary tract infection, site not specified: Secondary | ICD-10-CM | POA: Insufficient documentation

## 2020-08-29 DIAGNOSIS — R109 Unspecified abdominal pain: Secondary | ICD-10-CM | POA: Diagnosis not present

## 2020-08-29 DIAGNOSIS — O2341 Unspecified infection of urinary tract in pregnancy, first trimester: Secondary | ICD-10-CM | POA: Insufficient documentation

## 2020-08-29 DIAGNOSIS — O26899 Other specified pregnancy related conditions, unspecified trimester: Secondary | ICD-10-CM

## 2020-08-29 LAB — COMPREHENSIVE METABOLIC PANEL
ALT: 11 U/L (ref 0–44)
AST: 17 U/L (ref 15–41)
Albumin: 3.5 g/dL (ref 3.5–5.0)
Alkaline Phosphatase: 30 U/L — ABNORMAL LOW (ref 38–126)
Anion gap: 7 (ref 5–15)
BUN: 6 mg/dL (ref 6–20)
CO2: 23 mmol/L (ref 22–32)
Calcium: 9 mg/dL (ref 8.9–10.3)
Chloride: 105 mmol/L (ref 98–111)
Creatinine, Ser: 0.67 mg/dL (ref 0.44–1.00)
GFR, Estimated: >60 mL/min (ref >60)
Glucose, Bld: 106 mg/dL — ABNORMAL HIGH (ref 70–99)
Potassium: 3.4 mmol/L — ABNORMAL LOW (ref 3.5–5.1)
Sodium: 135 mmol/L (ref 135–145)
Total Bilirubin: 0.3 mg/dL (ref 0.3–1.2)
Total Protein: 6.4 g/dL — ABNORMAL LOW (ref 6.5–8.1)

## 2020-08-29 LAB — CBC WITH DIFFERENTIAL/PLATELET
Abs Immature Granulocytes: 0.01 10*3/uL (ref 0.00–0.07)
Basophils Absolute: 0 10*3/uL (ref 0.0–0.1)
Basophils Relative: 0 %
Eosinophils Absolute: 0.2 10*3/uL (ref 0.0–0.5)
Eosinophils Relative: 4 %
HCT: 32.5 % — ABNORMAL LOW (ref 36.0–46.0)
Hemoglobin: 10.8 g/dL — ABNORMAL LOW (ref 12.0–15.0)
Immature Granulocytes: 0 %
Lymphocytes Relative: 43 %
Lymphs Abs: 2.2 10*3/uL (ref 0.7–4.0)
MCH: 27.9 pg (ref 26.0–34.0)
MCHC: 33.2 g/dL (ref 30.0–36.0)
MCV: 84 fL (ref 80.0–100.0)
Monocytes Absolute: 0.3 10*3/uL (ref 0.1–1.0)
Monocytes Relative: 5 %
Neutro Abs: 2.5 10*3/uL (ref 1.7–7.7)
Neutrophils Relative %: 48 %
Platelets: 214 10*3/uL (ref 150–400)
RBC: 3.87 MIL/uL (ref 3.87–5.11)
RDW: 13.2 % (ref 11.5–15.5)
WBC: 5.2 10*3/uL (ref 4.0–10.5)
nRBC: 0 % (ref 0.0–0.2)

## 2020-08-29 LAB — URINALYSIS, ROUTINE W REFLEX MICROSCOPIC
Bacteria, UA: NONE SEEN
Bilirubin Urine: NEGATIVE
Glucose, UA: NEGATIVE mg/dL
Hgb urine dipstick: NEGATIVE
Ketones, ur: NEGATIVE mg/dL
Nitrite: NEGATIVE
Protein, ur: NEGATIVE mg/dL
Specific Gravity, Urine: 1.028 (ref 1.005–1.030)
pH: 5 (ref 5.0–8.0)

## 2020-08-29 LAB — WET PREP, GENITAL
Clue Cells Wet Prep HPF POC: NONE SEEN
Sperm: NONE SEEN
Trich, Wet Prep: NONE SEEN
Yeast Wet Prep HPF POC: NONE SEEN

## 2020-08-29 MED ORDER — CEFADROXIL 500 MG PO CAPS: 500.0000 mg | ORAL_CAPSULE | Freq: Two times a day (BID) | ORAL | 0 refills | Status: DC

## 2020-08-29 NOTE — MAU Note (Signed)
When she woke up this morning, she was cramping.  Took some Tylenol, went to work, continued to cramp all day. No bleeding or d/c. No urinary issues,no diarrhea or constipation. (asked if we could do STD testing on urine)

## 2020-08-29 NOTE — MAU Provider Note (Signed)
History     CSN: [id number]  Arrival date and time: 08/29/20 1450   First Provider Initiated Contact with Patient 08/29/20 1629      Chief Complaint  Patient presents with  . Abdominal Pain   Andrea Jenkins is a 24 y.o. year old [id number] female at [redacted]w[redacted]d weeks gestation who presents to MAU reporting she woke up cramping. She took Tylenol 1000 mg, went to work and continued to cramp  She denies VB or vaginal d/c, urinary issues, diarrhea, or constipation. She requests for STD testing; "just because." She receives [facility], next appt on Wednesday 09/03/2020.   OB History    Gravida  3   Para  2   Term  2   Preterm  0   AB  0   Living  2     SAB  0   TAB  0   Ectopic  0   Multiple  0   Live Births  2           Past Medical History:  Diagnosis Date  . Anxiety   . Asthma    pt states she doesn't use inhaler much (04/10/19)  . Depression   . Gonorrhea   . Heart murmur    when younger  . Vaginal Pap smear, abnormal     Past Surgical History:  Procedure Laterality Date  . CATARACT EXTRACTION    . WISDOM TOOTH EXTRACTION    . WRIST SURGERY      Family History  Problem Relation Age of Onset  . Hypertension Mother   . Miscarriages / [place] Mother   . Hypertension Maternal Grandmother   . Asthma Maternal Grandmother   . Diabetes Maternal Grandmother   . Vision loss Maternal Grandmother   . Mental illness Brother   . Cancer Maternal Aunt   . Mental illness Sister   . Heart disease Neg Hx     Social History   Tobacco Use  . Smoking status: Former Smoker    Packs/day: 0.25    Types: Cigars    Quit date: 03/11/2017    Years since quitting: 3.4  . Smokeless tobacco: Never Used  Vaping Use  . Vaping Use: Never used  Substance Use Topics  . Alcohol use: No    Alcohol/week: 0.0 standard drinks    Comment: mimosa on saturday. 04/06/19  . Drug use: Not Currently    Types: Marijuana    Allergies:  Allergies  Allergen Reactions   . Peach [Prunus Persica] Hives    Medications Prior to Admission  Medication Sig Dispense Refill Last Dose  . Prenatal Vit-Fe Fumarate-FA (PREPLUS) 27-1 MG TABS Take 1 tablet by mouth daily. 30 tablet 13 08/28/2020 at Unknown time  . acetaminophen (TYLENOL) 325 MG tablet Take 2 tablets (650 mg total) by mouth every 4 (four) hours as needed (for pain scale < 4). (Patient not taking: Reported on 01/15/2020) 30 tablet 0   . Blood Pressure Monitoring (BLOOD PRESSURE MONITOR DELUXE) KIT Please check blood pressure 1-2 times per week. 1 kit 0   . buPROPion (WELLBUTRIN) 75 MG tablet Take 1 tablet (75 mg total) by mouth daily. (Patient not taking: Reported on 08/27/2020) 30 tablet 2   . metoCLOPramide (REGLAN) 10 MG tablet Take 1 tablet (10 mg total) by mouth every 8 (eight) hours as needed for nausea. (Patient not taking: Reported on 08/27/2020) 30 tablet 0   . Prenat-Fe Poly-Methfol-FA-DHA (VITAFOL ULTRA) 29-0.6-0.4-200 MG CAPS Take 1 tablet by  mouth daily. (Patient not taking: Reported on 08/27/2020) 30 capsule 12     Review of Systems  Constitutional: Negative.   HENT: Negative.   Eyes: Negative.   Respiratory: Negative.   Cardiovascular: Negative.   Gastrointestinal: Negative.   Endocrine: Negative.   Genitourinary: Positive for pelvic pain (cramping). Negative for dysuria, vaginal bleeding and vaginal discharge.  Musculoskeletal: Negative.   Skin: Negative.   Allergic/Immunologic: Negative.   Neurological: Negative.   Hematological: Negative.   Psychiatric/Behavioral: Negative.    Physical Exam   Blood pressure 121/61, pulse 67, temperature 98.7 F (37.1 C), temperature source Oral, resp. rate 17, height 5' 3.5" (1.613 m), weight 79.7 kg, last menstrual period 06/13/2020, SpO2 100 %, not currently breastfeeding.  Physical Exam Vitals and nursing note reviewed. Exam conducted with a chaperone present.  Constitutional:      Appearance: Normal appearance. She is normal weight.  HENT:      Head: Normocephalic.  Cardiovascular:     Rate and Rhythm: Normal rate.  Pulmonary:     Effort: Pulmonary effort is normal.  Abdominal:     General: Abdomen is flat.  Genitourinary:    General: Normal vulva.     Comments: Uterus: enlarged, SE: cervix is smooth, pink, no lesions, small amt of thick, white vaginal d/c -- WP, GC/CT done, closed/long/firm, no CMT or friability, no adnexal tenderness  Musculoskeletal:        General: Normal range of motion.     Cervical back: Normal range of motion.  Skin:    General: Skin is warm and dry.  Neurological:     Mental Status: She is alert and oriented to person, place, and time.  Psychiatric:        Mood and Affect: Mood normal.        Behavior: Behavior normal.        Thought Content: Thought content normal.        Judgment: Judgment normal.    FHTs by doppler: 138 bpm  MAU Course  Procedures  MDM CCUA UCx -- Results pending  Wet Prep GC/CT -- Results pending   Results for orders placed or performed during the hospital encounter of 08/29/20 (from the past 24 hour(s))  Urinalysis, Routine w reflex microscopic Urine, Clean Catch     Status: Abnormal   Collection Time: 08/29/20  3:49 PM  Result Value Ref Range   Color, Urine YELLOW YELLOW   APPearance HAZY (A) CLEAR   Specific Gravity, Urine 1.028 1.005 - 1.030   pH 5.0 5.0 - 8.0   Glucose, UA NEGATIVE NEGATIVE mg/dL   Hgb urine dipstick NEGATIVE NEGATIVE   Bilirubin Urine NEGATIVE NEGATIVE   Ketones, ur NEGATIVE NEGATIVE mg/dL   Protein, ur NEGATIVE NEGATIVE mg/dL   Nitrite NEGATIVE NEGATIVE   Leukocytes,Ua SMALL (A) NEGATIVE   RBC / HPF 0-5 0 - 5 RBC/hpf   WBC, UA 11-20 0 - 5 WBC/hpf   Bacteria, UA NONE SEEN NONE SEEN   Squamous Epithelial / LPF 6-10 0 - 5   Mucus PRESENT   Wet prep, genital     Status: Abnormal   Collection Time: 08/29/20  4:40 PM   Specimen: PATH Cytology Cervicovaginal Ancillary Only  Result Value Ref Range   Yeast Wet Prep HPF POC NONE SEEN  NONE SEEN   Trich, Wet Prep NONE SEEN NONE SEEN   Clue Cells Wet Prep HPF POC NONE SEEN NONE SEEN   WBC, Wet Prep HPF POC MANY (A) NONE SEEN  Sperm NONE SEEN   CBC with Differential/Platelet     Status: Abnormal   Collection Time: 08/29/20  4:51 PM  Result Value Ref Range   WBC 5.2 4.0 - 10.5 K/uL   RBC 3.87 3.87 - 5.11 MIL/uL   Hemoglobin 10.8 (L) 12.0 - 15.0 g/dL   HCT 32.5 (L) 36 - 46 %   MCV 84.0 80.0 - 100.0 fL   MCH 27.9 26.0 - 34.0 pg   MCHC 33.2 30.0 - 36.0 g/dL   RDW 13.2 11.5 - 15.5 %   Platelets 214 150 - 400 K/uL   nRBC 0.0 0.0 - 0.2 %   Neutrophils Relative % 48 %   Neutro Abs 2.5 1.7 - 7.7 K/uL   Lymphocytes Relative 43 %   Lymphs Abs 2.2 0.7 - 4.0 K/uL   Monocytes Relative 5 %   Monocytes Absolute 0.3 0.1 - 1.0 K/uL   Eosinophils Relative 4 %   Eosinophils Absolute 0.2 0.0 - 0.5 K/uL   Basophils Relative 0 %   Basophils Absolute 0.0 0.0 - 0.1 K/uL   Immature Granulocytes 0 %   Abs Immature Granulocytes 0.01 0.00 - 0.07 K/uL    US OB LESS THAN 14 WEEKS WITH OB TRANSVAGINAL  Result Date: 08/29/2020 CLINICAL DATA:  Pelvic cramping. EXAM: OBSTETRIC <14 WK [place] AND TRANSVAGINAL OB US TECHNIQUE: Both transabdominal and transvaginal ultrasound examinations were performed for complete evaluation of the gestation as well as the maternal uterus, adnexal regions, and pelvic cul-de-sac. Transvaginal technique was performed to assess early pregnancy. COMPARISON:  OB ultrasound dated July 29, 2020. FINDINGS: Intrauterine gestational sac: Single. Yolk sac:  Not Visualized. Embryo:  Visualized. Cardiac Activity: Visualized. Heart Rate: 171 bpm CRL:  41.6 mm   11 w   0 d                  [place] EDC: 03/20/2021 Subchorionic hemorrhage:  None visualized. Maternal uterus/adnexae: Unremarkable. IMPRESSION: 1. Single live intrauterine pregnancy with estimated gestational age of [redacted] weeks, 0 days. No acute abnormality. Electronically Signed   By: Titus Dubin M.D.   On: 08/29/2020 17:39     Assessment and Plan  1. Abdominal cramping complicating pregnancy - Information provided on abdominal pain in pregnancy - Advised to continue taking Tylenol 1000 mg, but to take it every 6 hrs for the next 48 hrs   2. UTI symptoms - UCx pending - Rx for Cefadroxil 500 mg BID x 7 days  - Discharge home - Keep scheduled appt with [facility] on Wednesday 09/03/20 - Patient verbalized an understanding of the plan of care and agrees.    Laury Deep, MSN, CNM 08/29/2020, 4:29 PM

## 2020-08-31 LAB — CULTURE, OB URINE: Culture: 40000 — AB

## 2020-09-01 LAB — GC/CHLAMYDIA PROBE AMP (~~LOC~~) NOT AT ARMC
Chlamydia: POSITIVE — AB
Comment: NEGATIVE
Comment: NORMAL
Neisseria Gonorrhea: NEGATIVE

## 2020-09-02 ENCOUNTER — Other Ambulatory Visit: Payer: Self-pay

## 2020-09-02 DIAGNOSIS — A749 Chlamydial infection, unspecified: Secondary | ICD-10-CM

## 2020-09-02 MED ORDER — AZITHROMYCIN 500 MG PO TABS: 1000.0000 mg | ORAL_TABLET | Freq: Once | ORAL | 1 refills | Status: DC

## 2020-09-03 ENCOUNTER — Ambulatory Visit (INDEPENDENT_AMBULATORY_CARE_PROVIDER_SITE_OTHER): Payer: Medicaid Other | Admitting: Nurse Practitioner

## 2020-09-03 ENCOUNTER — Encounter: Payer: Self-pay | Admitting: Nurse Practitioner

## 2020-09-03 ENCOUNTER — Other Ambulatory Visit: Payer: Self-pay

## 2020-09-03 VITALS — BP 112/60 | HR 68 | Wt 172.0 lb

## 2020-09-03 DIAGNOSIS — O98819 Other maternal infectious and parasitic diseases complicating pregnancy, unspecified trimester: Secondary | ICD-10-CM

## 2020-09-03 DIAGNOSIS — Z3A11 11 weeks gestation of pregnancy: Secondary | ICD-10-CM | POA: Diagnosis not present

## 2020-09-03 DIAGNOSIS — O09899 Supervision of other high risk pregnancies, unspecified trimester: Secondary | ICD-10-CM

## 2020-09-03 DIAGNOSIS — O0990 Supervision of high risk pregnancy, unspecified, unspecified trimester: Secondary | ICD-10-CM | POA: Diagnosis not present

## 2020-09-03 DIAGNOSIS — R87612 Low grade squamous intraepithelial lesion on cytologic smear of cervix (LGSIL): Secondary | ICD-10-CM

## 2020-09-03 DIAGNOSIS — O219 Vomiting of pregnancy, unspecified: Secondary | ICD-10-CM

## 2020-09-03 DIAGNOSIS — Z3481 Encounter for supervision of other normal pregnancy, first trimester: Secondary | ICD-10-CM | POA: Diagnosis not present

## 2020-09-03 DIAGNOSIS — O0991 Supervision of high risk pregnancy, unspecified, first trimester: Secondary | ICD-10-CM

## 2020-09-03 DIAGNOSIS — A749 Chlamydial infection, unspecified: Secondary | ICD-10-CM

## 2020-09-03 MED ORDER — COMFORT FIT MATERNITY SUPP MED MISC: 0 refills | Status: DC

## 2020-09-03 MED ORDER — DOXYLAMINE-PYRIDOXINE 10-10 MG PO TBEC: DELAYED_RELEASE_TABLET | ORAL | 2 refills | Status: DC

## 2020-09-03 NOTE — Progress Notes (Signed)
NOB  Last pap: 02/15/2018 WNL  Genetic Screening: Desires  Planned : No  +CT on 08/29/20 per MAU notes  Pt had U/S on 08/29/20  CC: pt notes not eating well pt needs to discuss W.I.C to receive protein shakes.  Back pain.   Pt wants to discuss Dx states she is not aware why she is considered High Risk.

## 2020-09-03 NOTE — Progress Notes (Signed)
Subjective:   Andrea Jenkins is a 24 y.o. G3P2002 at 24w5dby LMP, early ultrasound being seen today for her first obstetrical visit.  Her obstetrical history is significant for chlamydia infection, short interpregnancy interval and nausea and vomiting. Patient does intend to breast feed. Pregnancy history fully reviewed.  Patient reports nausea and vomiting.  HISTORY: OB History  Gravida Para Term Preterm AB Living  _0 0 0 2  SAB TAB Ectopic Multiple Live Births  0 0 0 0 2    # Outcome Date GA Lbr Len/2nd Weight Sex Delivery Anes PTL Lv  3 Current           2 Term 12/04/19 322w0d2:45 / 00:20 7 lb 0.9 oz (3.201 kg) M Vag-Spont EPI  LIV     Name: Andrea Jenkins   Apgar1: 9  Apgar5: 9  1 Term 01/18/18 3939w6d:57 / 00:18 7 lb 6.7 oz (3.365 kg) F Vag-Spont EPI  LIV     Name: Andrea Jenkins     Apgar1: 8  Apgar5: 9   Past Medical History:  Diagnosis Date  . Anxiety   . Asthma    pt states she doesn't use inhaler much (04/10/19)  . Depression   . Gonorrhea   . Heart murmur    when younger  . Vaginal Pap smear, abnormal    Past Surgical History:  Procedure Laterality Date  . CATARACT EXTRACTION    . WISDOM TOOTH EXTRACTION    . WRIST SURGERY     Family History  Problem Relation Age of Onset  . Hypertension Mother   . Miscarriages / [place]ather   . Hypertension Maternal Grandmother   . Asthma Maternal Grandmother   . Diabetes Maternal Grandmother   . Vision loss Maternal Grandmother   . Mental illness Brother   . Cancer Maternal Aunt   . Mental illness Sister   . Heart disease Neg Hx    Social History   Tobacco Use  . Smoking status: Former Smoker    Packs/day: 0.25    Types: Cigars    Quit date: 03/11/2017    Years since quitting: 3.4  . Smokeless tobacco: Never Used  Vaping Use  . Vaping Use: Never used  Substance Use Topics  . Alcohol use: No    Alcohol/week: 0.0 standard drinks    Comment: mimosa on saturday. 04/06/19  .  Drug use: Not Currently    Types: Marijuana   Allergies  Allergen Reactions  . Peach [Prunus Persica] Hives   Current Outpatient Medications on File Prior to Visit  Medication Sig Dispense Refill  . Blood Pressure Monitoring (BLOOD PRESSURE MONITOR DELUXE) KIT Please check blood pressure 1-2 times per week. (Patient not taking: Reported on 09/03/2020) 1 kit 0  . metoCLOPramide (REGLAN) 10 MG tablet Take 1 tablet (10 mg total) by mouth every 8 (eight) hours as needed for nausea. (Patient not taking: Reported on 08/27/2020) 30 tablet 0  . Prenat-Fe Poly-Methfol-FA-DHA (VITAFOL ULTRA) 29-0.6-0.4-200 MG CAPS Take 1 tablet by mouth daily. (Patient not taking: Reported on 08/27/2020) 30 capsule 12  . Prenatal Vit-Fe Fumarate-FA (PREPLUS) 27-1 MG TABS Take 1 tablet by mouth daily. (Patient not taking: Reported on 09/03/2020) 30 tablet 13  . [DISCONTINUED] omeprazole (PRILOSEC) 20 MG capsule Take 1 capsule (20 mg total) by mouth 2 (two) times daily before a meal. (Patient not taking: Reported on 08/31/2019) 60 capsule 5   No current facility-administered medications on file  prior to visit.     Exam   Vitals:   09/03/20 1039  BP: 112/60  Pulse: 68  Weight: 172 lb (78 kg)      Uterus:   11 weeks  Pelvic Exam: Perineum: Pelvic deferred   Vulva:    Vagina:     Cervix:    Adnexa:    Bony Pelvis:   System: General: well-developed, well-nourished female in no acute distress   Breast:  normal appearance, no masses or tenderness   Skin: normal coloration and turgor, no rashes   Neurologic: oriented, normal, negative, normal mood   Extremities: normal strength, tone, and muscle mass, ROM of all joints is normal   HEENT extraocular movement intact and sclera clear, anicteric   Mouth/Teeth    Neck supple and no masses, normal thyroid   Cardiovascular: regular rate and rhythm   Respiratory:  no respiratory distress, normal breath sounds   Abdomen: soft, non-tender; no masses,  no organomegaly       Assessment:   Pregnancy: [id number] Patient Active Problem List   Diagnosis Date Noted  . Supervision of high risk pregnancy, antepartum 08/26/2020  . Anxiety   . Alpha thalassemia silent carrier 10/24/2019  . Fetal echogenic intracardiac focus on prenatal ultrasound 08/31/2019  . Short interval between pregnancies affecting pregnancy, antepartum 05/30/2019  . 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 LGSIL on Pap smear of cervix 10/20/2016     Plan:  1. Supervision of high risk pregnancy, antepartum Was using the patch and then just stopped reapplying it and became pregnant Wants to space additional children - advised to consider PP IUD Pregnancy support belt ordered at client's request  - CBC/D/Plt+RPR+Rh+ABO+Rub Ab... - Culture, OB Urine - Genetic Screening  2. LGSIL on Pap smear of cervix Will repeat postpartum.  Last pap in 2019 was normal but no transformation zone cells obtained.  3. Nausea and vomiting during pregnancy prior to [redacted] weeks gestation Prescribed diclegis This is worse than in her previous pregnancies  4. Chlamydia infection affecting pregnancy, antepartum Client plans to pick up medication today.  Partner being treated today.  She questions why the last treatment did not work.  Will plan TOC in 4 weeks. Advised to wait a minimum of 7 days after treatment for healing before having intercourse and advised to see if she or partner is having any additional sex partners.    5. Short interval between pregnancies affecting pregnancy, antepartum Considering IUD?    Initial labs drawn. Continue prenatal vitamins. Genetic Screening discussed, NIPS: ordered. Ultrasound discussed; fetal anatomic survey: ordered. Problem list reviewed and updated. The nature of [facility] with multiple MDs and other Advanced Practice Providers was explained to patient; also emphasized that residents, students are part of our team. Routine  obstetric precautions reviewed. Return in about 4 weeks (around 10/01/2020) for in person ROB.  Total face-to-face time with patient: 40 minutes.  Over 50% of encounter was spent on counseling and coordination of care.     Earlie Server, FNP Family Nurse Practitioner, [facility] for [employer], [facility] 09/03/2020 11:23 AM

## 2020-09-04 LAB — CBC/D/PLT+RPR+RH+ABO+RUB AB...
Antibody Screen: NEGATIVE
Basophils Absolute: 0 x10E3/uL (ref 0.0–0.2)
Basos: 1 %
EOS (ABSOLUTE): 0.2 x10E3/uL (ref 0.0–0.4)
Eos: 4 %
HCV Ab: <0.1 {s_co_ratio} (ref 0.0–0.9)
HIV Screen 4th Generation wRfx: NONREACTIVE
Hematocrit: 35.6 % (ref 34.0–46.6)
Hemoglobin: 11.8 g/dL (ref 11.1–15.9)
Hepatitis B Surface Ag: NEGATIVE
Immature Grans (Abs): 0 x10E3/uL (ref 0.0–0.1)
Immature Granulocytes: 0 %
Lymphocytes Absolute: 2 x10E3/uL (ref 0.7–3.1)
Lymphs: 48 %
MCH: 27.6 pg (ref 26.6–33.0)
MCHC: 33.1 g/dL (ref 31.5–35.7)
MCV: 83 fL (ref 79–97)
Monocytes Absolute: 0.3 x10E3/uL (ref 0.1–0.9)
Monocytes: 6 %
Neutrophils Absolute: 1.7 x10E3/uL (ref 1.4–7.0)
Neutrophils: 41 %
Platelets: 208 x10E3/uL (ref 150–450)
RBC: 4.28 x10E6/uL (ref 3.77–5.28)
RDW: 13.6 % (ref 11.7–15.4)
RPR Ser Ql: NONREACTIVE
Rh Factor: POSITIVE
Rubella Antibodies, IGG: 2.01 {index}
WBC: 4.2 x10E3/uL (ref 3.4–10.8)

## 2020-09-04 LAB — HCV INTERPRETATION

## 2020-09-05 LAB — CULTURE, OB URINE

## 2020-09-05 LAB — URINE CULTURE, OB REFLEX

## 2020-09-08 ENCOUNTER — Encounter: Payer: Self-pay | Admitting: Obstetrics and Gynecology

## 2020-09-09 ENCOUNTER — Other Ambulatory Visit: Payer: Self-pay

## 2020-09-09 NOTE — Progress Notes (Signed)
Paperwork faxed back for Ensure along with medical notes.

## 2020-09-10 ENCOUNTER — Other Ambulatory Visit: Payer: Self-pay | Admitting: Obstetrics

## 2020-09-10 DIAGNOSIS — A749 Chlamydial infection, unspecified: Secondary | ICD-10-CM

## 2020-09-10 MED ORDER — AZITHROMYCIN 500 MG PO TABS: 1000.0000 mg | ORAL_TABLET | Freq: Once | ORAL | Status: DC

## 2020-09-12 ENCOUNTER — Other Ambulatory Visit: Payer: Self-pay | Admitting: Obstetrics

## 2020-09-12 DIAGNOSIS — A749 Chlamydial infection, unspecified: Secondary | ICD-10-CM

## 2020-09-30 ENCOUNTER — Other Ambulatory Visit: Payer: Self-pay

## 2020-09-30 ENCOUNTER — Ambulatory Visit (INDEPENDENT_AMBULATORY_CARE_PROVIDER_SITE_OTHER): Payer: Medicaid Other | Admitting: Advanced Practice Midwife

## 2020-09-30 ENCOUNTER — Other Ambulatory Visit (HOSPITAL_COMMUNITY)
Admission: RE | Admit: 2020-09-30 | Discharge: 2020-09-30 | Disposition: A | Discharge status: Home / Self Care | Payer: Medicaid Other | Source: Ambulatory Visit | Attending: Advanced Practice Midwife | Admitting: Advanced Practice Midwife

## 2020-09-30 ENCOUNTER — Encounter: Payer: Self-pay | Admitting: Advanced Practice Midwife

## 2020-09-30 VITALS — BP 111/60 | HR 80 | Wt 169.0 lb

## 2020-09-30 DIAGNOSIS — Z3A15 15 weeks gestation of pregnancy: Secondary | ICD-10-CM

## 2020-09-30 DIAGNOSIS — O0990 Supervision of high risk pregnancy, unspecified, unspecified trimester: Secondary | ICD-10-CM | POA: Insufficient documentation

## 2020-09-30 NOTE — Progress Notes (Signed)
   PRENATAL VISIT NOTE  Subjective:  Andrea Jenkins is a 24 y.o. G3P2002 at [redacted]w[redacted]d being seen today for ongoing prenatal care.  She is currently monitored for the following issues for this low-risk pregnancy and has LGSIL on Pap smear of cervix; Blindness of right eye; Short interval between pregnancies affecting pregnancy, antepartum; Alpha thalassemia silent carrier; Anxiety; and Supervision of high risk pregnancy, antepartum on their problem list.  Patient reports no complaints.  Contractions: Not present. Vag. Bleeding: None.  Movement: Present. Denies leaking of fluid.   The following portions of the patient's history were reviewed and updated as appropriate: allergies, current medications, past family history, past medical history, past social history, past surgical history and problem list.   Objective:   Vitals:   09/30/20 1544  BP: 111/60  Pulse: 80  Weight: 169 lb (76.7 kg)    Fetal Status: Fetal Heart Rate (bpm): 150   Movement: Present     General:  Alert, oriented and cooperative. Patient is in no acute distress.  Skin: Skin is warm and dry. No rash noted.   Cardiovascular: Normal heart rate noted  Respiratory: Normal respiratory effort, no problems with respiration noted  Abdomen: Soft, gravid, appropriate for gestational age.  Pain/Pressure: Absent     Pelvic: Cervical exam deferred        Extremities: Normal range of motion.  Edema: None  Mental Status: Normal mood and affect. Normal behavior. Normal judgment and thought content.   Assessment and Plan:  Pregnancy: G3P2002 at [redacted]w[redacted]d 1. [redacted] weeks gestation of pregnancy - AFP today   2. Supervision of high risk pregnancy, antepartum - Routine care - Anatomy scan scheduled  - Chlamydia test of cure today   Preterm labor symptoms and general obstetric precautions including but not limited to vaginal bleeding, contractions, leaking of fluid and fetal movement were reviewed in detail with the patient. Please refer to  After Visit Summary for other counseling recommendations.   Return in about 4 weeks (around 10/28/2020).  Future Appointments  Date Time Provider Department Center  10/29/2020 10:45 AM [facility] US[facility]    Thressa Sheller DNP, CNM  09/30/20  4:17 PM

## 2020-09-30 NOTE — Progress Notes (Signed)
Pt presents for ROB, TOC CT, and AFP only  Pt reports lack of appetite and she's unable to drink Ensure because she doesn't like the smell  Pt lost 16 lbs since the beginning of pregnancy  Pt also c/o malodorous vaginal discharge x couple days  Anatomy scheduled 10/29/20

## 2020-10-02 ENCOUNTER — Encounter: Payer: Medicaid Other | Admitting: Nurse Practitioner

## 2020-10-02 LAB — AFP, SERUM, OPEN SPINA BIFIDA
AFP MoM: 1.26
AFP Value: 40.1 ng/mL
Gest. Age on Collection Date: 15.4 weeks
Maternal Age At EDD: 23.4 yr
OSBR Risk 1 IN: 10000
Test Results:: NEGATIVE
Weight: 169 [lb_av]

## 2020-10-02 LAB — CERVICOVAGINAL ANCILLARY ONLY
Bacterial Vaginitis (gardnerella): NEGATIVE
Candida Glabrata: NEGATIVE
Candida Vaginitis: NEGATIVE
Chlamydia: NEGATIVE
Comment: NEGATIVE
Comment: NEGATIVE
Comment: NEGATIVE
Comment: NEGATIVE
Comment: NEGATIVE
Comment: NORMAL
Neisseria Gonorrhea: NEGATIVE
Trichomonas: NEGATIVE

## 2020-10-08 ENCOUNTER — Inpatient Hospital Stay (HOSPITAL_COMMUNITY)
Admission: AD | Admit: 2020-10-08 | Discharge: 2020-10-08 | Discharge status: Against Medical Advice | Payer: Medicaid Other | Attending: Obstetrics and Gynecology | Admitting: Obstetrics and Gynecology

## 2020-10-08 ENCOUNTER — Other Ambulatory Visit: Payer: Self-pay

## 2020-10-08 NOTE — MAU Note (Signed)
Pt left AMA after signing AMA form.  

## 2020-10-08 NOTE — Progress Notes (Signed)
PT called and not in lobby.

## 2020-10-13 ENCOUNTER — Other Ambulatory Visit: Payer: Self-pay

## 2020-10-13 ENCOUNTER — Inpatient Hospital Stay (HOSPITAL_COMMUNITY)
Admission: AD | Admit: 2020-10-13 | Discharge: 2020-10-13 | Disposition: A | Discharge status: Home / Self Care | Payer: Medicaid Other | Attending: Family Medicine | Admitting: Family Medicine

## 2020-10-13 ENCOUNTER — Encounter (HOSPITAL_COMMUNITY): Payer: Self-pay | Admitting: Obstetrics and Gynecology

## 2020-10-13 DIAGNOSIS — N898 Other specified noninflammatory disorders of vagina: Secondary | ICD-10-CM

## 2020-10-13 DIAGNOSIS — O99891 Other specified diseases and conditions complicating pregnancy: Secondary | ICD-10-CM

## 2020-10-13 DIAGNOSIS — Z3492 Encounter for supervision of normal pregnancy, unspecified, second trimester: Secondary | ICD-10-CM

## 2020-10-13 DIAGNOSIS — F419 Anxiety disorder, unspecified: Secondary | ICD-10-CM

## 2020-10-13 DIAGNOSIS — O26892 Other specified pregnancy related conditions, second trimester: Secondary | ICD-10-CM | POA: Insufficient documentation

## 2020-10-13 DIAGNOSIS — O99342 Other mental disorders complicating pregnancy, second trimester: Secondary | ICD-10-CM | POA: Diagnosis not present

## 2020-10-13 DIAGNOSIS — F32A Depression, unspecified: Secondary | ICD-10-CM

## 2020-10-13 DIAGNOSIS — F329 Major depressive disorder, single episode, unspecified: Secondary | ICD-10-CM | POA: Insufficient documentation

## 2020-10-13 DIAGNOSIS — Z3A17 17 weeks gestation of pregnancy: Secondary | ICD-10-CM | POA: Diagnosis not present

## 2020-10-13 LAB — URINALYSIS, ROUTINE W REFLEX MICROSCOPIC
Bilirubin Urine: NEGATIVE
Glucose, UA: NEGATIVE mg/dL
Hgb urine dipstick: NEGATIVE
Ketones, ur: NEGATIVE mg/dL
Nitrite: NEGATIVE
Protein, ur: NEGATIVE mg/dL
Specific Gravity, Urine: 1.023 (ref 1.005–1.030)
pH: 5 (ref 5.0–8.0)

## 2020-10-13 LAB — WET PREP, GENITAL
Clue Cells Wet Prep HPF POC: NONE SEEN
Sperm: NONE SEEN
Trich, Wet Prep: NONE SEEN
Yeast Wet Prep HPF POC: NONE SEEN

## 2020-10-13 MED ORDER — SERTRALINE HCL 50 MG PO TABS: 50.0000 mg | ORAL_TABLET | Freq: Every day | ORAL | 3 refills | Status: DC

## 2020-10-13 NOTE — Discharge Instructions (Signed)
Managing Anxiety, Adult After being diagnosed with an anxiety disorder, you may be relieved to know why you have felt or behaved a certain way. You may also feel overwhelmed about the treatment ahead and what it will mean for your life. With care and support, you can manage this condition and recover from it. How to manage lifestyle changes Managing stress and anxiety  Stress is your body's reaction to life changes and events, both good and bad. Most stress will last just a few hours, but stress can be ongoing and can lead to more than just stress. Although stress can play a major role in anxiety, it is not the same as anxiety. Stress is usually caused by something external, such as a deadline, test, or competition. Stress normally passes after the triggering event has ended.  Anxiety is caused by something internal, such as imagining a terrible outcome or worrying that something will go wrong that will devastate you. Anxiety often does not go away even after the triggering event is over, and it can become long-term (chronic) worry. It is important to understand the differences between stress and anxiety and to manage your stress effectively so that it does not lead to an anxious response. Talk with your health care provider or a counselor to learn more about reducing anxiety and stress. He or she may suggest tension reduction techniques, such as:  Music therapy. This can include creating or listening to music that you enjoy and that inspires you.  Mindfulness-based meditation. This involves being aware of your normal breaths while not trying to control your breathing. It can be done while sitting or walking.  Centering prayer. This involves focusing on a word, phrase, or sacred image that means something to you and brings you peace.  Deep breathing. To do this, expand your stomach and inhale slowly through your nose. Hold your breath for 3-5 seconds. Then exhale slowly, letting your stomach muscles  relax.  Self-talk. This involves identifying thought patterns that lead to anxiety reactions and changing those patterns.  Muscle relaxation. This involves tensing muscles and then relaxing them. Choose a tension reduction technique that suits your lifestyle and personality. These techniques take time and practice. Set aside 5-15 minutes a day to do them. Therapists can offer counseling and training in these techniques. The training to help with anxiety may be covered by some insurance plans. Other things you can do to manage stress and anxiety include:  Keeping a stress/anxiety diary. This can help you learn what triggers your reaction and then learn ways to manage your response.  Thinking about how you react to certain situations. You may not be able to control everything, but you can control your response.  Making time for activities that help you relax and not feeling guilty about spending your time in this way.  Visual imagery and yoga can help you stay calm and relax.  Medicines Medicines can help ease symptoms. Medicines for anxiety include:  Anti-anxiety drugs.  Antidepressants. Medicines are often used as a primary treatment for anxiety disorder. Medicines will be prescribed by a health care provider. When used together, medicines, psychotherapy, and tension reduction techniques may be the most effective treatment. Relationships Relationships can play a big part in helping you recover. Try to spend more time connecting with trusted friends and family members. Consider going to couples counseling, taking family education classes, or going to family therapy. Therapy can help you and others better understand your condition. How to recognize changes in your   anxiety Everyone responds differently to treatment for anxiety. Recovery from anxiety happens when symptoms decrease and stop interfering with your daily activities at home or work. This may mean that you will start to:  Have  better concentration and focus. Worry will interfere less in your daily thinking.  Sleep better.  Be less irritable.  Have more energy.  Have improved memory. It is important to recognize when your condition is getting worse. Contact your health care provider if your symptoms interfere with home or work and you feel like your condition is not improving. Follow these instructions at home: Activity  Exercise. Most adults should do the following: ? Exercise for at least 150 minutes each week. The exercise should increase your heart rate and make you sweat (moderate-intensity exercise). ? Strengthening exercises at least twice a week.  Get the right amount and quality of sleep. Most adults need 7-9 hours of sleep each night. Lifestyle   Eat a healthy diet that includes plenty of vegetables, fruits, whole grains, low-fat dairy products, and lean protein. Do not eat a lot of foods that are high in solid fats, added sugars, or salt.  Make choices that simplify your life.  Do not use any products that contain nicotine or tobacco, such as cigarettes, e-cigarettes, and chewing tobacco. If you need help quitting, ask your health care provider.  Avoid caffeine, alcohol, and certain over-the-counter cold medicines. These may make you feel worse. Ask your pharmacist which medicines to avoid. General instructions  Take over-the-counter and prescription medicines only as told by your health care provider.  Keep all follow-up visits as told by your health care provider. This is important. Where to find support You can get help and support from these sources:  Self-help groups.  Online and community organizations.  A trusted spiritual leader.  Couples counseling.  Family education classes.  Family therapy. Where to find more information You may find that joining a support group helps you deal with your anxiety. The following sources can help you locate counselors or support groups near  you:  Mental Health America: www.mentalhealthamerica.net  Anxiety and Depression Association of America (ADAA): www.adaa.org  National Alliance on Mental Illness (NAMI): www.nami.org Contact a health care provider if you:  Have a hard time staying focused or finishing daily tasks.  Spend many hours a day feeling worried about everyday life.  Become exhausted by worry.  Start to have headaches, feel tense, or have nausea.  Urinate more than normal.  Have diarrhea. Get help right away if you have:  A racing heart and shortness of breath.  Thoughts of hurting yourself or others. If you ever feel like you may hurt yourself or others, or have thoughts about taking your own life, get help right away. You can go to your nearest emergency department or call:  Your local emergency services (911 in the [place]  A suicide crisis helpline, such as the National Suicide Prevention Lifeline at 1-800-273-8255. This is open 24 hours a day. Summary  Taking steps to learn and use tension reduction techniques can help calm you and help prevent triggering an anxiety reaction.  When used together, medicines, psychotherapy, and tension reduction techniques may be the most effective treatment.  Family, friends, and partners can play a big part in helping you recover from an anxiety disorder. This information is not intended to replace advice given to you by your health care provider. Make sure you discuss any questions you have with your health care provider. Document Revised:   03/13/2019 Document Reviewed: 03/13/2019 Elsevier Patient Education  2020 [employer] Living With Depression Everyone experiences occasional disappointment, sadness, and loss in their lives. When you are feeling down, blue, or sad for at least 2 weeks in a row, it may mean that you have depression. Depression can affect your thoughts and feelings, relationships, daily activities, and physical health. It is caused by changes  in the way your brain functions. If you receive a diagnosis of depression, your health care provider will tell you which type of depression you have and what treatment options are available to you. If you are living with depression, there are ways to help you recover from it and also ways to prevent it from coming back. How to cope with lifestyle changes Coping with stress     Stress is your body's reaction to life changes and events, both good and bad. Stressful situations may include:  Getting married.  The death of a spouse.  Losing a job.  Retiring.  Having a baby. Stress can last just a few hours or it can be ongoing. Stress can play a major role in depression, so it is important to learn both how to cope with stress and how to think about it differently. Talk with your health care provider or a counselor if you would like to learn more about stress reduction. He or she may suggest some stress reduction techniques, such as:  Music therapy. This can include creating music or listening to music. Choose music that you enjoy and that inspires you.  Mindfulness-based meditation. This kind of meditation can be done while sitting or walking. It involves being aware of your normal breaths, rather than trying to control your breathing.  Centering prayer. This is a kind of meditation that involves focusing on a spiritual word or phrase. Choose a word, phrase, or sacred image that is meaningful to you and that brings you peace.  Deep breathing. To do this, expand your stomach and inhale slowly through your nose. Hold your breath for 3-5 seconds, then exhale slowly, allowing your stomach muscles to relax.  Muscle relaxation. This involves intentionally tensing muscles then relaxing them. Choose a stress reduction technique that fits your lifestyle and personality. Stress reduction techniques take time and practice to develop. Set aside 5-15 minutes a day to do them. Therapists can offer  training in these techniques. The training may be covered by some insurance plans. Other things you can do to manage stress include:  Keeping a stress diary. This can help you learn what triggers your stress and ways to control your response.  Understanding what your limits are and saying no to requests or events that lead to a schedule that is too full.  Thinking about how you respond to certain situations. You may not be able to control everything, but you can control how you react.  Adding humor to your life by watching funny films or TV shows.  Making time for activities that help you relax and not feeling guilty about spending your time this way.  Medicines Your health care provider may suggest certain medicines if he or she feels that they will help improve your condition. Avoid using alcohol and other substances that may prevent your medicines from working properly (may interact). It is also important to:  Talk with your pharmacist or health care provider about all the medicines that you take, their possible side effects, and what medicines are safe to take together.  Make it your goal to take   part in all treatment decisions (shared decision-making). This includes giving input on the side effects of medicines. It is best if shared decision-making with your health care provider is part of your total treatment plan. If your health care provider prescribes a medicine, you may not notice the full benefits of it for 4-8 weeks. Most people who are treated for depression need to be on medicine for at least 6-12 months after they feel better. If you are taking medicines as part of your treatment, do not stop taking medicines without first talking to your health care provider. You may need to have the medicine slowly decreased (tapered) over time to decrease the risk of harmful side effects. Relationships Your health care provider may suggest family therapy along with individual therapy and drug  therapy. While there may not be family problems that are causing you to feel depressed, it is still important to make sure your family learns as much as they can about your mental health. Having your family's support can help make your treatment successful. How to recognize changes in your condition Everyone has a different response to treatment for depression. Recovery from major depression happens when you have not had signs of major depression for two months. This may mean that you will start to:  Have more interest in doing activities.  Feel less hopeless than you did 2 months ago.  Have more energy.  Overeat less often, or have better or improving appetite.  Have better concentration. Your health care provider will work with you to decide the next steps in your recovery. It is also important to recognize when your condition is getting worse. Watch for these signs:  Having fatigue or low energy.  Eating too much or too little.  Sleeping too much or too little.  Feeling restless, agitated, or hopeless.  Having trouble concentrating or making decisions.  Having unexplained physical complaints.  Feeling irritable, angry, or aggressive. Get help as soon as you or your family members notice these symptoms coming back. How to get support and help from others How to talk with friends and family members about your condition  Talking to friends and family members about your condition can provide you with one way to get support and guidance. Reach out to trusted friends or family members, explain your symptoms to them, and let them know that you are working with a health care provider to treat your depression. Financial resources Not all insurance plans cover mental health care, so it is important to check with your insurance carrier. If paying for co-pays or counseling services is a problem, search for a local or county mental health care center. They may be able to offer public mental  health care services at low or no cost when you are not able to see a private health care provider. If you are taking medicine for depression, you may be able to get the generic form, which may be less expensive. Some makers of prescription medicines also offer help to patients who cannot afford the medicines they need. Follow these instructions at home:   Get the right amount and quality of sleep.  Cut down on using caffeine, tobacco, alcohol, and other potentially harmful substances.  Try to exercise, such as walking or lifting small weights.  Take over-the-counter and prescription medicines only as told by your health care provider.  Eat a healthy diet that includes plenty of vegetables, fruits, whole grains, low-fat dairy products, and lean protein. Do not eat a lot   of foods that are high in solid fats, added sugars, or salt.  Keep all follow-up visits as told by your health care provider. This is important. Contact a health care provider if:  You stop taking your antidepressant medicines, and you have any of these symptoms: ? Nausea. ? Headache. ? Feeling lightheaded. ? Chills and body aches. ? Not being able to sleep (insomnia).  You or your friends and family think your depression is getting worse. Get help right away if:  You have thoughts of hurting yourself or others. If you ever feel like you may hurt yourself or others, or have thoughts about taking your own life, get help right away. You can go to your nearest emergency department or call:  Your local emergency services (911 in the [place]  A suicide crisis helpline, such as the National Suicide Prevention Lifeline at 1-800-273-8255. This is open 24-hours a day. Summary  If you are living with depression, there are ways to help you recover from it and also ways to prevent it from coming back.  Work with your health care team to create a management plan that includes counseling, stress management techniques, and  healthy lifestyle habits. This information is not intended to replace advice given to you by your health care provider. Make sure you discuss any questions you have with your health care provider. Document Revised: 02/02/2019 Document Reviewed: 09/13/2016 Elsevier Patient Education  2020 Elsevier Inc.  

## 2020-10-13 NOTE — MAU Provider Note (Signed)
Event Date/Time   First Provider Initiated Contact with Patient 10/13/20 1008     S [name] is a 24 y.o. [id number] pregnant female at [redacted]w[redacted]d who presents to MAU today with complaint of cramping since 10/08/20. She was initially also having some vaginal spotting and came to MAU, but the wait was too long so she went home. The bleeding has since stopped but she continues to have mild, diffuse, lower abdominal cramping. She denies urinary symptoms or abnormal discharge/odor. She also admits to increasing anxiety/depression with suicidal ideation without intent/plan. She was diagnosed with postpartum mood disorder (anx/dep) in March of 2021 after her last delivery and was put on Zoloft. She stopped taking her Zoloft at the beginning of this pregnancy as she was unsure about its safety.  O BP (!) 111/53   Pulse 69   Temp 98.4 F (36.9 C) (Oral)   Resp 16   Ht 5' 3.5" (1.613 m)   Wt 172 lb 3.2 oz (78.1 kg)   LMP 06/13/2020   SpO2 100%   BMI 30.03 kg/m  Physical Exam Vitals and nursing note reviewed.  Constitutional:      General: She is not in acute distress.    Appearance: She is well-developed and normal weight. She is not ill-appearing.  Cardiovascular:     Rate and Rhythm: Normal rate.  Pulmonary:     Effort: Pulmonary effort is normal.  Abdominal:     Palpations: Abdomen is soft.     Tenderness: There is no abdominal tenderness.  Genitourinary:    Vagina: Normal.     Cervix: Normal.     Uterus: Normal.      Comments: Physiologic discharge seen during speculum exam, cervix closed, no blood noted. Skin:    General: Skin is warm.     Capillary Refill: Capillary refill takes less than 2 seconds.  Neurological:     Mental Status: She is alert and oriented to person, place, and time.  Psychiatric:        Mood and Affect: Mood is anxious and depressed.        Behavior: Behavior normal.    FHR: 150  Results for orders placed or performed during the hospital encounter  of 10/13/20 (from the past 24 hour(s))  Urinalysis, Routine w reflex microscopic     Status: Abnormal   Collection Time: 10/13/20  9:28 AM  Result Value Ref Range   Color, Urine YELLOW YELLOW   APPearance HAZY (A) CLEAR   Specific Gravity, Urine 1.023 1.005 - 1.030   pH 5.0 5.0 - 8.0   Glucose, UA NEGATIVE NEGATIVE mg/dL   Hgb urine dipstick NEGATIVE NEGATIVE   Bilirubin Urine NEGATIVE NEGATIVE   Ketones, ur NEGATIVE NEGATIVE mg/dL   Protein, ur NEGATIVE NEGATIVE mg/dL   Nitrite NEGATIVE NEGATIVE   Leukocytes,Ua TRACE (A) NEGATIVE   RBC / HPF 0-5 0 - 5 RBC/hpf   WBC, UA 6-10 0 - 5 WBC/hpf   Bacteria, UA RARE (A) NONE SEEN   Squamous Epithelial / LPF 0-5 0 - 5   Mucus PRESENT    A Anxiety/depression affecting second trimester of pregnancy Leukorrhea Fetal heart tones detected  P Discharge from MAU in stable condition with preterm labor and behavioral health precautions Next appt at [facility] on 10/30/19 Referral to [facility] made, message sent to Gwyndolyn Saxon, LICSW for close follow up   Bernerd Limbo, CNM 10/13/2020 10:34 AM

## 2020-10-13 NOTE — MAU Note (Signed)
Was here a couple days ago for bleeding, was busy so left.  Bleeding has stopped, but is still cramping. No constipation, one loose stool yesterday, denies fever or  Problems with urination

## 2020-10-14 LAB — GC/CHLAMYDIA PROBE AMP (~~LOC~~) NOT AT ARMC
Chlamydia: NEGATIVE
Comment: NEGATIVE
Comment: NORMAL
Neisseria Gonorrhea: NEGATIVE

## 2020-10-25 NOTE — L&D Delivery Note (Signed)
  OB/GYN Faculty Practice Delivery Note  Andrea Jenkins is a 25 y.o. [id number] s/p SVD at [redacted]w[redacted]d. She was admitted for PROM 5/16 @ 1330.   ROM: 11h 65m with clear fluid GBS Status: neg Maximum Maternal Temperature: 98.3  Delivery Date/Time: 05/17 @0034  Delivery: Called to room and patient was complete and pushing. Head delivered vaginally, spontaneous (presentation LOA). No nuchal cord present. Shoulder and body delivered in usual fashion. Infant placed on mother's abdomen, dried and vigorously stimulated. Cord clamped x 2 after 1-minute delay, cut by FOB under my direct supervision. Cord blood drawn. Placenta delivered spontaneously with gentle cord traction. Fundus firm with massage. Labia, perineum, vagina, and cervix were inspected, no lacerations seen.   Placenta: intact, three vessel cord with central insertion Complications: None Lacerations: None EBL: 150 mL Analgesia: Epidural  Postpartum Planning Mom to postpartum. Infant to couplet/skin to skin.   Infant: female  APGARs 8, 9    The above was performed by , Med Student, under my direct supervision and guidance.

## 2020-10-29 ENCOUNTER — Ambulatory Visit (INDEPENDENT_AMBULATORY_CARE_PROVIDER_SITE_OTHER): Payer: Medicaid Other | Admitting: Obstetrics and Gynecology

## 2020-10-29 ENCOUNTER — Other Ambulatory Visit: Payer: Self-pay | Admitting: *Deleted

## 2020-10-29 ENCOUNTER — Encounter: Payer: Self-pay | Admitting: Obstetrics and Gynecology

## 2020-10-29 ENCOUNTER — Other Ambulatory Visit: Payer: Self-pay | Admitting: Nurse Practitioner

## 2020-10-29 ENCOUNTER — Other Ambulatory Visit: Payer: Self-pay

## 2020-10-29 ENCOUNTER — Ambulatory Visit: Payer: Medicaid Other | Attending: Nurse Practitioner

## 2020-10-29 VITALS — BP 114/71 | HR 96 | Wt 165.0 lb

## 2020-10-29 DIAGNOSIS — Z3A19 19 weeks gestation of pregnancy: Secondary | ICD-10-CM

## 2020-10-29 DIAGNOSIS — E669 Obesity, unspecified: Secondary | ICD-10-CM

## 2020-10-29 DIAGNOSIS — O0990 Supervision of high risk pregnancy, unspecified, unspecified trimester: Secondary | ICD-10-CM

## 2020-10-29 DIAGNOSIS — O09899 Supervision of other high risk pregnancies, unspecified trimester: Secondary | ICD-10-CM

## 2020-10-29 DIAGNOSIS — O99342 Other mental disorders complicating pregnancy, second trimester: Secondary | ICD-10-CM

## 2020-10-29 DIAGNOSIS — F419 Anxiety disorder, unspecified: Secondary | ICD-10-CM

## 2020-10-29 DIAGNOSIS — F32A Depression, unspecified: Secondary | ICD-10-CM

## 2020-10-29 DIAGNOSIS — Z6832 Body mass index (BMI) 32.0-32.9, adult: Secondary | ICD-10-CM

## 2020-10-29 NOTE — Progress Notes (Addendum)
LOW-RISK PREGNANCY OFFICE VISIT Patient name: Andrea Jenkins MRN [id number]  Date of birth: 1996-07-08 Chief Complaint:   Routine Prenatal Visit  History of Present Illness:   Andrea Jenkins is a 25 y.o. G78P2002 female at [redacted]w[redacted]d with an Estimated Date of Delivery: 03/20/21 being seen today for ongoing management of a low-risk pregnancy.  Today she reports no complaints. Had anatomy U/S this morning. She states "they said the baby's nose is flat, but I think it is because she was moving around a lot and the tech couldn't take good pictures of her during the U/S. They said that about my son (1st pregnancy) and everything was ok." Contractions: Not present. Vag. Bleeding: None.  Movement: Present. denies leaking of fluid. Review of Systems:   Pertinent items are noted in HPI Denies abnormal vaginal discharge w/ itching/odor/irritation, headaches, visual changes, shortness of breath, chest pain, abdominal pain, severe nausea/vomiting, or problems with urination or bowel movements unless otherwise stated above. Pertinent History Reviewed:  Reviewed past medical,surgical, social, obstetrical and family history.  Reviewed problem list, medications and allergies. Physical Assessment:   Vitals:   10/29/20 1310  BP: 114/71  Pulse: 96  Weight: 165 lb (74.8 kg)  Body mass index is 28.77 kg/m.        Physical Examination:   General appearance: Well appearing, and in no distress  Mental status: Alert, oriented to person, place, and time  Skin: Warm & dry  Cardiovascular: Normal heart rate noted  Respiratory: Normal respiratory effort, no distress  Abdomen: Soft, gravid, nontender  Pelvic: Cervical exam deferred         Extremities: Edema: None  Fetal Status: Fetal Heart Rate (bpm): 152 Fundal Height: 19 cm Movement: Present      [place] MFM OB DETAIL +14 WK (Accession [id number]) (Order [id number]) Narrative & Impression   ----------------------------------------------------------------------  OBSTETRICS REPORT                       (Signed Final 10/29/2020 12:38 pm) ---------------------------------------------------------------------- Patient Info  ID #:       [id number]                          D.O.B.:  10/13/1996 (24 yrs)  Name:       Andrea Jenkins [facility]               Visit Date: 10/29/2020 11:08 am ---------------------------------------------------------------------- Performed By  Attending:        Lin Landsman      Ref. Address:     Faculty Practice                    MD  Performed By:     Truitt Leep,      [facility]  Referred By:      Currie Paris NP ---------------------------------------------------------------------- Orders  #  Description                           Code        Ordered By  1  [place] MFM OB DETAIL +14 WK               24097.35    Nolene Bernheim ----------------------------------------------------------------------  #  Order #                     Accession #                Episode #  1  [id number]                   [id number]                 [id number] ---------------------------------------------------------------------- Indications  [redacted] weeks gestation of pregnancy                Z3A.19  Obesity complicating pregnancy, second         O99.212  trimester (Pre-G BMI 32)  Encounter for antenatal screening for          Z36.3  malformations  Short interval between pregancies, 2nd         O09.892  trimester ---------------------------------------------------------------------- Fetal Evaluation  Num Of Fetuses:         1  Cardiac Activity:       Observed  Presentation:           Breech  Placenta:               Right  Posterior Fundal  P. Cord Insertion:      Visualized, central  Amniotic Fluid  AFI FV:      Within normal limits ---------------------------------------------------------------------- Biometry  BPD:      43.6  mm     G. Age:  19w 1d         28  %    CI:        73.17   %    70 - 86                                                          FL/HC:      19.0   %    16.8 - 19.8  HC:       162   mm     G. Age:  19w 0d         13  %    HC/AC:      1.15        1.09 - 1.39  AC:      140.3  mm  G. Age:  19w 3d         35  %    FL/BPD:     70.6   %  FL:       30.8  mm     G. Age:  19w 4d         36  %    FL/AC:      22.0   %    20 - 24  HUM:      30.9  mm     G. Age:  20w 2d         67  %  CER:      19.5  mm     G. Age:  19w 0d         37  %  NFT:       3.6  mm  LV:        6.2  mm  CM:        4.1  mm  Est. FW:     291  gm    0 lb 10 oz      29  % ---------------------------------------------------------------------- OB History  Gravidity:    3         Term:   2  Living:       2 ---------------------------------------------------------------------- Gestational Age  LMP:           19w 5d        Date:  06/13/20                 EDD:   03/20/21  U/S Today:     19w 2d                                        EDD:   03/23/21  Best:          19w 5d     Det. By:  LMP  (06/13/20)          EDD:   03/20/21 ---------------------------------------------------------------------- Anatomy  Cranium:               Appears normal         LVOT:                   Appears normal  Cavum:                 Appears normal         Aortic Arch:            Appears normal  Ventricles:            Appears normal         Ductal Arch:            Appears normal  Choroid Plexus:        Appears normal         Diaphragm:              Appears normal  Cerebellum:            Appears normal         Stomach:                Appears normal, left  sided  Posterior Fossa:        Appears normal         Abdomen:                Appears normal  Nuchal Fold:           Appears normal         Abdominal Wall:         Appears nml (cord                                                                        insert, abd wall)  Face:                  Profile Abnormal,      Cord Vessels:           Appears normal (3                         Orbits - NL                                                                        vessel cord)  Lips:                  Appears normal         Kidneys:                Appear normal  Palate:                Not well visualized    Bladder:                Appears normal  Thoracic:              Appears normal         Spine:                  Appears normal  Heart:                 Appears normal         Upper Extremities:      Appears normal                         (4CH, axis, and                         situs)  RVOT:                  Appears normal         Lower Extremities:      Appears normal  Other:  Fetus appears to be female. Difficult to visualize nasal bone / flat          facies. Heels/feet and open hands/5th digits visualized. VC, 3VV and          3VTV visualized. ---------------------------------------------------------------------- Cervix Uterus Adnexa  Cervix  Length:           3.19  cm.  Normal appearance by transabdominal scan.  Right Ovary  Within normal limits.  Left Ovary  Within normal limits. ---------------------------------------------------------------------- Impression  Single intrauterine pregnancy here for a detailed anatomy  due to elevated BMI  Normal anatomy with measurements consistent with dates  There is good fetal movement and amniotic fluid volume  Today we obtained suboptimal views of the fetal profile. The  profile appeared flattened nose, widened nasa bridge and  poorly visualized maxilla.  I discussed today's ultrasound with Ms. Kley. We will  have her return in 4  weeks. ---------------------------------------------------------------------- Recommendations  Follow up growth in 4 weeks. ----------------------------------------------------------------------               Sander Nephew, MD Electronically Signed Final Report   10/29/2020 12:38 pm ----------------------------------------------------------------------       Assessment & Plan:  1) Low-risk pregnancy G3P2002 at 108w5d with an Estimated Date of Delivery: 03/20/21   2) Supervision of high risk pregnancy, antepartum - U/S reviewed with patient - Reassurance given that she is more than likely right about U/S, normal Panorama and AFP results  3) Anxiety during pregnancy in second trimester, antepartum  - Amb ref to [place]  4) Depression affecting pregnancy in second trimester, antepartum  - Amb ref to [place]  5) [redacted] weeks gestation of pregnancy    Meds: No orders of the defined types were placed in this encounter.  Labs/procedures today: none  Plan:  Continue routine obstetrical care   Reviewed: Preterm labor symptoms and general obstetric precautions including but not limited to vaginal bleeding, contractions, leaking of fluid and fetal movement were reviewed in detail with the patient.  All questions were answered. Has home bp cuff. Check bp weekly, let us know if >140/90.   Follow-up: Return in about 4 weeks (around 11/26/2020) for Return OB - My Chart video.  Orders Placed This Encounter  Procedures  . Amb ref to [place] MSN, CNM 10/29/2020 1:29 PM

## 2020-10-29 NOTE — Progress Notes (Signed)
ROB 19w  CC: concerned about recent U/S results.  Wants to discuss.

## 2020-11-03 ENCOUNTER — Telehealth: Payer: Self-pay | Admitting: Licensed Clinical Social Worker

## 2020-11-03 ENCOUNTER — Encounter: Payer: Medicaid Other | Admitting: Licensed Clinical Social Worker

## 2020-11-03 NOTE — Telephone Encounter (Signed)
Called pt twice regarding scheduled visit and sent text message link for mychart visit. Unable to reach pt left detailed message for callback

## 2020-11-03 NOTE — Telephone Encounter (Signed)
Called ph number listed. Left message for callback

## 2020-11-26 ENCOUNTER — Other Ambulatory Visit: Payer: Self-pay

## 2020-11-26 ENCOUNTER — Ambulatory Visit: Payer: Medicaid Other | Attending: Obstetrics and Gynecology

## 2020-11-26 ENCOUNTER — Telehealth (INDEPENDENT_AMBULATORY_CARE_PROVIDER_SITE_OTHER): Payer: Medicaid Other | Admitting: Obstetrics and Gynecology

## 2020-11-26 ENCOUNTER — Encounter: Payer: Self-pay | Admitting: *Deleted

## 2020-11-26 ENCOUNTER — Ambulatory Visit: Payer: Medicaid Other | Admitting: *Deleted

## 2020-11-26 VITALS — BP 113/55 | HR 70

## 2020-11-26 DIAGNOSIS — F419 Anxiety disorder, unspecified: Secondary | ICD-10-CM | POA: Diagnosis not present

## 2020-11-26 DIAGNOSIS — O99342 Other mental disorders complicating pregnancy, second trimester: Secondary | ICD-10-CM | POA: Diagnosis not present

## 2020-11-26 DIAGNOSIS — O99212 Obesity complicating pregnancy, second trimester: Secondary | ICD-10-CM | POA: Diagnosis not present

## 2020-11-26 DIAGNOSIS — Z148 Genetic carrier of other disease: Secondary | ICD-10-CM

## 2020-11-26 DIAGNOSIS — O0990 Supervision of high risk pregnancy, unspecified, unspecified trimester: Secondary | ICD-10-CM | POA: Diagnosis not present

## 2020-11-26 DIAGNOSIS — Z363 Encounter for antenatal screening for malformations: Secondary | ICD-10-CM | POA: Diagnosis not present

## 2020-11-26 DIAGNOSIS — Z3A23 23 weeks gestation of pregnancy: Secondary | ICD-10-CM | POA: Diagnosis not present

## 2020-11-26 DIAGNOSIS — E669 Obesity, unspecified: Secondary | ICD-10-CM | POA: Diagnosis not present

## 2020-11-26 DIAGNOSIS — O09892 Supervision of other high risk pregnancies, second trimester: Secondary | ICD-10-CM

## 2020-11-26 DIAGNOSIS — Z6832 Body mass index (BMI) 32.0-32.9, adult: Secondary | ICD-10-CM

## 2020-11-26 NOTE — Patient Instructions (Signed)
[address]  [place], [place] [address]  Mental health urgent care.

## 2020-11-26 NOTE — Progress Notes (Signed)
No blood pressure to report Would like to increase zoloft

## 2020-11-26 NOTE — Progress Notes (Signed)
TELEHEALTH OBSTETRICS VISIT ENCOUNTER NOTE  Provider location: [facility] [place]   I connected with Kathryne Hitch Relph on 11/26/20 at  1:00 PM EST by telephone at home and verified that I am speaking with the correct person using two identifiers. Of note, unable to do video encounter due to technical difficulties.    I discussed the limitations, risks, security and privacy concerns of performing an evaluation and management service by telephone and the availability of in person appointments. I also discussed with the patient that there may be a patient responsible charge related to this service. The patient expressed understanding and agreed to proceed.  Patient is at home, providers is in the office. Attempted virtual visit multiple times without success. Video visit done.   Subjective:  Andrea Jenkins is a 25 y.o. G3P2002 at [redacted]w[redacted]d being followed for ongoing prenatal care.  She is currently monitored for the following issues for this low-risk pregnancy and has LGSIL on Pap smear of cervix; Short interval between pregnancies affecting pregnancy, antepartum; Alpha thalassemia silent carrier; Anxiety; and Supervision of high risk pregnancy, antepartum on their problem list.  Patient reports no complaints. Reports fetal movement. Denies any contractions, bleeding or leaking of fluid.   The following portions of the patient's history were reviewed and updated as appropriate: allergies, current medications, past family history, past medical history, past social history, past surgical history and problem list.   Objective:  Last menstrual period 06/13/2020, not currently breastfeeding. General:  Alert, oriented and cooperative.   Mental Status: Normal mood and affect perceived. Normal judgment and thought content.  Rest of physical exam deferred due to type of encounter  Assessment and Plan:  Pregnancy: G3P2002 at [redacted]w[redacted]d  1. Supervision of high risk pregnancy,  antepartum  -Discussed IUD for birth control  -Has not checked her BP this week; will check it and report to the office.  -Currently taking Zoloft 50 mg since Dec 2021. She reports anxiousness and depression. She reports last week she had a thought about killing herself, no thoughts now, and does not have a plan. Has taken Zoloft in the past and has never had thoughts of killing self.  She reports Zoloft in the past has worked well. She would like to increase her dose. No homicidal or suicidal thoughts currently. She has never devised a plan.   - Increase Zoloft to 100 mg daily.  - message sent to Sue Lush to reach out to patient. - Mood check in 1 week, virtual is ok.  - Information and address given to her on the mental health urgent care in [place].  This is a walk in 24/7 facility.    Preterm labor symptoms and general obstetric precautions including but not limited to vaginal bleeding, contractions, leaking of fluid and fetal movement were reviewed in detail with the patient.  I discussed the assessment and treatment plan with the patient. The patient was provided an opportunity to ask questions and all were answered. The patient agreed with the plan and demonstrated an understanding of the instructions. The patient was advised to call back or seek an in-person office evaluation/go to [facility]U at [facility] for any urgent or concerning symptoms. Please refer to After Visit Summary for other counseling recommendations.   I provided 13 minutes of non-face-to-face time during this encounter.  Return in about 1 week (around 12/03/2020), or Virtual mood check..  Future Appointments  Date Time Provider Department Center  11/26/2020  1:45 PM  Ala Bent, RN [facility]  11/26/2020  2:00 PM [facility] US1 [facility]    Venia Carbon, NP [facility], [facility]

## 2020-11-27 ENCOUNTER — Other Ambulatory Visit: Payer: Self-pay | Admitting: *Deleted

## 2020-11-27 DIAGNOSIS — R6252 Short stature (child): Secondary | ICD-10-CM

## 2020-12-03 ENCOUNTER — Telehealth (INDEPENDENT_AMBULATORY_CARE_PROVIDER_SITE_OTHER): Payer: Medicaid Other | Admitting: Women's Health

## 2020-12-03 NOTE — Progress Notes (Signed)
+   Fetal movement. Pt is not at home at time of visit and was unable to take BP. Pt is having virtual visit today to f/u on increase of zoloft. PHQ9 score was 20. Pt has been on 75mg  since 11/26/20.

## 2020-12-03 NOTE — Progress Notes (Signed)
Patient called and identified by two identifiers to check in on mood. Patient not currently at home. Patient tells provider that she was taking 75mg  Zoloft daily for the past week, and then was told to increase to 100mg  daily starting today by provider she saw last week. Patient reports it has been helping, but we did not get to discuss this further. In the background, provider heard noises of patient getting into car and a crying child. Shortly after starting the conversation patient states, "I'm having a rough day and need to reschedule." Front desk notified to call patient to reschedule. Patient aware she can call at any time with questions.  , NP  2:46 PM 12/03/2020

## 2020-12-04 ENCOUNTER — Telehealth: Payer: Self-pay | Admitting: General Practice

## 2020-12-04 NOTE — Telephone Encounter (Signed)
Attempted to reach Andrea Jenkins today to get her scheduled for a phone visit with the Managed Medicaid RNCM. I left my contact info for her to call me back. I will reach out again in the next 7-14 days if I have not heard back from her.

## 2020-12-08 ENCOUNTER — Ambulatory Visit (INDEPENDENT_AMBULATORY_CARE_PROVIDER_SITE_OTHER): Payer: Medicaid Other | Admitting: Licensed Clinical Social Worker

## 2020-12-08 DIAGNOSIS — O99340 Other mental disorders complicating pregnancy, unspecified trimester: Secondary | ICD-10-CM

## 2020-12-08 DIAGNOSIS — Z3A00 Weeks of gestation of pregnancy not specified: Secondary | ICD-10-CM

## 2020-12-08 DIAGNOSIS — F419 Anxiety disorder, unspecified: Secondary | ICD-10-CM

## 2020-12-10 NOTE — BH Specialist Note (Signed)
Integrated Behavioral Health via Telemedicine Visit  12/10/2020 TAMMALA WEIDER [id number]  Number of Integrated Behavioral Health visits: 1/6 Session Start time: 1:11pm  Session End time: 1:30pm Total time: 19 mins via mychart  Referring Provider: Farris Has NP Patient/Family location: Home  [facility] Provider location: [facility]  All persons participating in visit: Pt T Ryden and LCSWA A. Felton Clinton  Types of Service: General Behavioral Integrated Care (BHI)  I connected with Kathryne Hitch Canning and/or Emonee L Montroy's n/a by Telephone  (Video is Caregility application) and verified that I am speaking with the correct person using two identifiers.Discussed confidentiality: Yes   I discussed the limitations of telemedicine and the availability of in person appointments.  Discussed there is a possibility of technology failure and discussed alternative modes of communication if that failure occurs.  I discussed that engaging in this telemedicine visit, they consent to the provision of behavioral healthcare and the services will be billed under their insurance.  Patient and/or legal guardian expressed understanding and consented to Telemedicine visit: Yes   Presenting Concerns: Patient and/or family reports the following symptoms/concerns: anxiety, worry, problems sleeping  Duration of problem: approx 3 months; Severity of problem: moderate  Patient and/or Family's Strengths/Protective Factors: Parental Resilience  Goals Addressed: Patient will: 1.  Reduce symptoms of: anxiety  2.  Increase knowledge and/or ability of: stress reduction  3.  Demonstrate ability to: Increase healthy adjustment to current life circumstances  Progress towards Goals: Ongoing  Interventions: Interventions utilized:  Supportive Counseling Standardized Assessments completed: PHQ 9  Assessment: Patient currently experiencing anxiety affecting pregnancy   Patient may benefit from integrated behavioral  health   Plan: 1. Follow up with behavioral health clinician on : prn  2. Behavioral recommendations: prioritize rest, adhere to prescription as directed, engage in stress reducing activity 3. Referral(s): n/a  I discussed the assessment and treatment plan with the patient and/or parent/guardian. They were provided an opportunity to ask questions and all were answered. They agreed with the plan and demonstrated an understanding of the instructions.   They were advised to call back or seek an in-person evaluation if the symptoms worsen or if the condition fails to improve as anticipated.  Gwyndolyn Saxon, LCSW

## 2020-12-18 ENCOUNTER — Other Ambulatory Visit: Payer: Self-pay

## 2020-12-18 MED ORDER — OMEPRAZOLE 20 MG PO CPDR: 20.0000 mg | DELAYED_RELEASE_CAPSULE | Freq: Every day | ORAL | 0 refills | Status: DC

## 2020-12-18 NOTE — Progress Notes (Signed)
Pt c/o acid reflux. Prilosec 20 mg 1 cap po qd sent per protocol

## 2020-12-23 ENCOUNTER — Ambulatory Visit (INDEPENDENT_AMBULATORY_CARE_PROVIDER_SITE_OTHER): Payer: Medicaid Other | Admitting: Licensed Clinical Social Worker

## 2020-12-23 ENCOUNTER — Other Ambulatory Visit: Payer: Medicaid Other

## 2020-12-23 ENCOUNTER — Other Ambulatory Visit: Payer: Self-pay

## 2020-12-23 ENCOUNTER — Ambulatory Visit (INDEPENDENT_AMBULATORY_CARE_PROVIDER_SITE_OTHER): Payer: Medicaid Other | Admitting: Obstetrics

## 2020-12-23 ENCOUNTER — Encounter: Payer: Self-pay | Admitting: Obstetrics

## 2020-12-23 VITALS — BP 110/50 | HR 71 | Wt 180.0 lb

## 2020-12-23 DIAGNOSIS — O99340 Other mental disorders complicating pregnancy, unspecified trimester: Secondary | ICD-10-CM

## 2020-12-23 DIAGNOSIS — O99342 Other mental disorders complicating pregnancy, second trimester: Secondary | ICD-10-CM

## 2020-12-23 DIAGNOSIS — O0990 Supervision of high risk pregnancy, unspecified, unspecified trimester: Secondary | ICD-10-CM

## 2020-12-23 DIAGNOSIS — F419 Anxiety disorder, unspecified: Secondary | ICD-10-CM

## 2020-12-23 DIAGNOSIS — F32A Depression, unspecified: Secondary | ICD-10-CM

## 2020-12-23 DIAGNOSIS — O09899 Supervision of other high risk pregnancies, unspecified trimester: Secondary | ICD-10-CM

## 2020-12-23 MED ORDER — SERTRALINE HCL 100 MG PO TABS: 100.0000 mg | ORAL_TABLET | Freq: Every day | ORAL | 5 refills | Status: DC

## 2020-12-23 NOTE — BH Specialist Note (Signed)
Integrated Behavioral Health Follow Up In-Person Visit  MRN: [id number] Name: Andrea Jenkins  Number of Integrated Behavioral Health Clinician visits: 2/6 Session Start time: 9:20am  Session End time: 9:50pm Total time: 30 minutes in person at [facility]   Types of Service: General Behavioral Integrated Care (BHI)  Interpretor:No. Interpretor Name and Language: None  Subjective: Andrea Jenkins is a 25 y.o. female accompanied by Andrea/a Patient was referred by Andrea Jenkins for anxiety. Patient reports the following symptoms/concerns: anxiety Duration of problem: approx 3 moths ; Severity of problem: mild  Objective: Mood: good and Affect: Appropriate Risk of harm to self or others: No plan to harm self or others  Life Context: Family and Social: lives with children in [place] School/Work: [employer]  Self-Care: Andrea/a Life Changes: new pregnancy/recent break up with boyfriend  Patient and/or Family's Strengths/Protective Factors: Concrete supports in place (healthy food, safe environments, etc.)  Goals Addressed: Patient will: 1.  Reduce symptoms of: anxiety  2.  Increase knowledge and/or ability of: coping skills  3.  Demonstrate ability to: Increase healthy adjustment to current life circumstances  Progress towards Goals: Ongoing  Interventions: Interventions utilized:  Supportive Counseling Standardized Assessments completed: Not Needed   Assessment: Patient currently experiencing anxiety during pregnancy  Patient may benefit from integrated behavioral health   Plan: 1. Follow up with behavioral health clinician on : 3 weeks via mychart  1. Behavioral recommendations: prioritize rest, adhere to prescription as directed, engage in stress reducing activity 2.  3. Referral(s): Andrea/a 4. "From scale of 1-10, how likely are you to follow plan?":   Andrea Saxon, LCSW

## 2020-12-23 NOTE — Addendum Note (Signed)
Addended by: Coral Ceo A on: 12/23/2020 03:40 PM   Modules accepted: Orders

## 2020-12-23 NOTE — Progress Notes (Addendum)
Subjective:  Andrea Jenkins is a 25 y.o. G3P2002 at [redacted]w[redacted]d being seen today for ongoing prenatal care.  She is currently monitored for the following issues for this high-risk pregnancy and has LGSIL on Pap smear of cervix; Short interval between pregnancies affecting pregnancy, antepartum; Alpha thalassemia silent carrier; Anxiety; and Supervision of high risk pregnancy, antepartum on their problem list.  Patient reports no complaints and swelling of feet.  Contractions: Not present.  .  Movement: Present. Denies leaking of fluid.   The following portions of the patient's history were reviewed and updated as appropriate: allergies, current medications, past family history, past medical history, past social history, past surgical history and problem list. Problem list updated.  Objective:   Vitals:   12/23/20 0901  BP: (!) 110/50  Pulse: 71  Weight: 180 lb (81.6 kg)    Fetal Status:     Movement: Present     General:  Alert, oriented and cooperative. Patient is in no acute distress.  Skin: Skin is warm and dry. No rash noted.   Cardiovascular: Normal heart rate noted  Respiratory: Normal respiratory effort, no problems with respiration noted  Abdomen: Soft, gravid, appropriate for gestational age. Pain/Pressure: Absent     Pelvic:  Cervical exam deferred        Extremities: Normal range of motion.  Edema: Trace  Mental Status: Normal mood and affect. Normal behavior. Normal judgment and thought content.   Urinalysis:      Assessment and Plan:  Pregnancy: G3P2002 at [redacted]w[redacted]d  1. Supervision of high risk pregnancy, antepartum Rx: - CBC - Glucose Tolerance, 2 Hours w/1 Hour - RPR - HIV Antibody (routine testing w rflx)  2. Short interval between pregnancies affecting pregnancy, antepartum  3. Depression affecting pregnancy, antepartum - taking Zoloft 100 mg po daily - mood and affect is good   Preterm labor symptoms and general obstetric precautions including but not limited  to vaginal bleeding, contractions, leaking of fluid and fetal movement were reviewed in detail with the patient. Please refer to After Visit Summary for other counseling recommendations.   Return in about 2 weeks (around 01/06/2021) for MyChart HOB.   Brock Bad, MD  12/23/20

## 2020-12-23 NOTE — Progress Notes (Signed)
ROB [redacted]w[redacted]d  2hr GTT Today .   JF:[id number] in feet.  Pt has questions if travel is safe pt plans on going to [place] in 9 days. Will need medical note is ok to travel.

## 2020-12-24 LAB — CBC
Hematocrit: 31.9 % — ABNORMAL LOW (ref 34.0–46.6)
Hemoglobin: 10.7 g/dL — ABNORMAL LOW (ref 11.1–15.9)
MCH: 27.9 pg (ref 26.6–33.0)
MCHC: 33.5 g/dL (ref 31.5–35.7)
MCV: 83 fL (ref 79–97)
Platelets: 156 10*3/uL (ref 150–450)
RBC: 3.84 x10E6/uL (ref 3.77–5.28)
RDW: 13.8 % (ref 11.7–15.4)
WBC: 5.8 10*3/uL (ref 3.4–10.8)

## 2020-12-24 LAB — GLUCOSE TOLERANCE, 2 HOURS W/ 1HR
Glucose, 1 hour: 88 mg/dL (ref 65–179)
Glucose, 2 hour: 95 mg/dL (ref 65–152)
Glucose, Fasting: 67 mg/dL (ref 65–91)

## 2020-12-24 LAB — RPR: RPR Ser Ql: NONREACTIVE

## 2020-12-24 LAB — HIV ANTIBODY (ROUTINE TESTING W REFLEX): HIV Screen 4th Generation wRfx: NONREACTIVE

## 2020-12-25 ENCOUNTER — Encounter: Payer: Medicaid Other | Admitting: Obstetrics

## 2020-12-25 ENCOUNTER — Other Ambulatory Visit: Payer: Self-pay | Admitting: Obstetrics

## 2020-12-25 ENCOUNTER — Other Ambulatory Visit: Payer: Medicaid Other

## 2020-12-25 DIAGNOSIS — D508 Other iron deficiency anemias: Secondary | ICD-10-CM

## 2020-12-25 MED ORDER — FERROUS SULFATE 325 (65 FE) MG PO TABS
325.0000 mg | ORAL_TABLET | Freq: Two times a day (BID) | ORAL | 5 refills | Status: DC
Start: 2020-12-25 — End: 2021-03-12

## 2020-12-29 ENCOUNTER — Other Ambulatory Visit: Payer: Self-pay

## 2020-12-29 MED ORDER — OMEPRAZOLE 20 MG PO CPDR: 20.0000 mg | DELAYED_RELEASE_CAPSULE | Freq: Two times a day (BID) | ORAL | 0 refills | Status: DC

## 2020-12-29 NOTE — Progress Notes (Signed)
Pt c/o acid reflux. Prilosec sent to the pharmacy per protocol.

## 2021-01-06 ENCOUNTER — Telehealth (INDEPENDENT_AMBULATORY_CARE_PROVIDER_SITE_OTHER): Payer: Medicaid Other | Admitting: Obstetrics and Gynecology

## 2021-01-06 DIAGNOSIS — D509 Iron deficiency anemia, unspecified: Secondary | ICD-10-CM

## 2021-01-06 DIAGNOSIS — O09899 Supervision of other high risk pregnancies, unspecified trimester: Secondary | ICD-10-CM

## 2021-01-06 DIAGNOSIS — Z3A29 29 weeks gestation of pregnancy: Secondary | ICD-10-CM

## 2021-01-06 DIAGNOSIS — O0990 Supervision of high risk pregnancy, unspecified, unspecified trimester: Secondary | ICD-10-CM

## 2021-01-06 DIAGNOSIS — O99343 Other mental disorders complicating pregnancy, third trimester: Secondary | ICD-10-CM

## 2021-01-06 DIAGNOSIS — O99013 Anemia complicating pregnancy, third trimester: Secondary | ICD-10-CM

## 2021-01-06 DIAGNOSIS — F419 Anxiety disorder, unspecified: Secondary | ICD-10-CM

## 2021-01-06 DIAGNOSIS — F418 Other specified anxiety disorders: Secondary | ICD-10-CM

## 2021-01-06 NOTE — Progress Notes (Signed)
   PRENATAL Virtual  VISIT NOTE  I connected with  Andrea Jenkins on 01/06/21 by a video enabled telemedicine application and verified that I am speaking with the correct person using two identifiers.   I discussed the limitations of evaluation and management by telemedicine. The patient expressed understanding and agreed to proceed. The patient is currently at a hotel and I am in my office.   Subjective:  Andrea Jenkins is a 25 y.o. G3P2002 at [redacted]w[redacted]d being seen today for ongoing prenatal care.  She is currently monitored for the following issues for this low-risk pregnancy and has LGSIL on Pap smear of cervix; Short interval between pregnancies affecting pregnancy, antepartum; Alpha thalassemia silent carrier; Anxiety; and Supervision of high risk pregnancy, antepartum on their problem list.  Patient reports feeling a lot of pressure and stabbing pain. Patient is in [facility] and is planning on returning home today. She declines coming to office later this week and prefers the video visit. She denies pain with urination, denies abnormal discharge. No diarrhea, leakage of fluid, or vaginal bleeding.  Contractions: Not present. Vag. Bleeding: None.  Movement: Present.   The following portions of the patient's history were reviewed and updated as appropriate: allergies, current medications, past family history, past medical history, past social history, past surgical history and problem list.   Objective:  There were no vitals filed for this visit.  Fetal Status:     Movement: Present     General:  Alert, oriented and cooperative. Patient is in no acute distress.  Skin: Skin is warm and dry. No rash noted.   Mental Status: Normal mood and affect. Normal behavior. Normal judgment and thought content.   Assessment and Plan:  Pregnancy: G3P2002 at [redacted]w[redacted]d  Supervision of High Risk Pregnancy -encouraged patient to follow up with [place] in person sooner if still experiences increased pressure and  pain. Patient currently out of town. Otherwise doing well overall   [redacted] Weeks Gestation  Anxiety and Depression in Pregnancy -continue zoloft 100mg , reports mood is good  Iron Deficiency Anemia  -encouraged to continue PO Fe supplement    Preterm labor symptoms and general obstetric precautions including but not limited to vaginal bleeding, contractions, leaking of fluid and fetal movement were reviewed in detail with the patient. Please refer to After Visit Summary for other counseling recommendations.   No follow-ups on file.  Future Appointments  Date Time Provider Department Center  01/21/2021  9:30 AM [facility] NURSE [facility]  01/21/2021  9:45 AM [facility]    01/23/2021, MD

## 2021-01-06 NOTE — Progress Notes (Signed)
Pt c/o increased pain in her vaginal area. States she is not having any issues with urination. Pt is not currently at home and does not have a way to check her BP.

## 2021-01-19 ENCOUNTER — Telehealth: Payer: Self-pay

## 2021-01-19 NOTE — Telephone Encounter (Signed)
Pt c/o increased low abdominal pressure. +FM, denies LOF, denies VB Pt may take (2) extra strength Tylenol as directed. She also had questions about maternity leave and FMLA. Informed pt to discuss starting maternity leave with her provider on Weds. It takes 7-10 business days to complete FMLA paperwork. Pt agrees and has no further questions.

## 2021-01-21 ENCOUNTER — Encounter: Payer: Self-pay | Admitting: *Deleted

## 2021-01-21 ENCOUNTER — Other Ambulatory Visit: Payer: Self-pay

## 2021-01-21 ENCOUNTER — Ambulatory Visit: Payer: Medicaid Other | Admitting: *Deleted

## 2021-01-21 ENCOUNTER — Other Ambulatory Visit (HOSPITAL_COMMUNITY)
Admission: RE | Admit: 2021-01-21 | Discharge: 2021-01-21 | Disposition: A | Discharge status: Home / Self Care | Payer: Medicaid Other | Source: Ambulatory Visit | Attending: Women's Health | Admitting: Women's Health

## 2021-01-21 ENCOUNTER — Ambulatory Visit (INDEPENDENT_AMBULATORY_CARE_PROVIDER_SITE_OTHER): Payer: Medicaid Other | Admitting: Women's Health

## 2021-01-21 ENCOUNTER — Ambulatory Visit: Payer: Medicaid Other | Attending: Obstetrics and Gynecology

## 2021-01-21 VITALS — BP 107/56 | HR 75 | Wt 180.0 lb

## 2021-01-21 VITALS — BP 109/53 | HR 64

## 2021-01-21 DIAGNOSIS — N898 Other specified noninflammatory disorders of vagina: Secondary | ICD-10-CM | POA: Diagnosis present

## 2021-01-21 DIAGNOSIS — O0990 Supervision of high risk pregnancy, unspecified, unspecified trimester: Secondary | ICD-10-CM

## 2021-01-21 DIAGNOSIS — O09893 Supervision of other high risk pregnancies, third trimester: Secondary | ICD-10-CM

## 2021-01-21 DIAGNOSIS — Z3A31 31 weeks gestation of pregnancy: Secondary | ICD-10-CM

## 2021-01-21 DIAGNOSIS — O99213 Obesity complicating pregnancy, third trimester: Secondary | ICD-10-CM

## 2021-01-21 DIAGNOSIS — Z363 Encounter for antenatal screening for malformations: Secondary | ICD-10-CM | POA: Diagnosis not present

## 2021-01-21 DIAGNOSIS — Z148 Genetic carrier of other disease: Secondary | ICD-10-CM

## 2021-01-21 DIAGNOSIS — D563 Thalassemia minor: Secondary | ICD-10-CM

## 2021-01-21 DIAGNOSIS — O09892 Supervision of other high risk pregnancies, second trimester: Secondary | ICD-10-CM | POA: Diagnosis not present

## 2021-01-21 DIAGNOSIS — Z23 Encounter for immunization: Secondary | ICD-10-CM | POA: Diagnosis not present

## 2021-01-21 DIAGNOSIS — R6252 Short stature (child): Secondary | ICD-10-CM | POA: Insufficient documentation

## 2021-01-21 DIAGNOSIS — E669 Obesity, unspecified: Secondary | ICD-10-CM | POA: Diagnosis not present

## 2021-01-21 DIAGNOSIS — O26893 Other specified pregnancy related conditions, third trimester: Secondary | ICD-10-CM

## 2021-01-21 DIAGNOSIS — F419 Anxiety disorder, unspecified: Secondary | ICD-10-CM

## 2021-01-21 DIAGNOSIS — R87612 Low grade squamous intraepithelial lesion on cytologic smear of cervix (LGSIL): Secondary | ICD-10-CM

## 2021-01-21 DIAGNOSIS — O09899 Supervision of other high risk pregnancies, unspecified trimester: Secondary | ICD-10-CM

## 2021-01-21 MED ORDER — SERTRALINE HCL 25 MG PO TABS: 25.0000 mg | ORAL_TABLET | Freq: Every day | ORAL | 2 refills | Status: DC

## 2021-01-21 NOTE — Progress Notes (Addendum)
Subjective:  Andrea Jenkins is a 25 y.o. G3P2002 at [redacted]w[redacted]d being seen today for ongoing prenatal care.  She is currently monitored for the following issues for this low-risk pregnancy and has LGSIL on Pap smear of cervix; Short interval between pregnancies affecting pregnancy, antepartum; Alpha thalassemia silent carrier; Anxiety; and Supervision of high risk pregnancy, antepartum on their problem list.  Patient reports pelvic pressure x2 mo that is worsening and white, milky discharge. Patient is requesting vaginal swab for STD testing.  Contractions: Not present. Vag. Bleeding: None.  Movement: Present. Denies leaking of fluid.   The following portions of the patient's history were reviewed and updated as appropriate: allergies, current medications, past family history, past medical history, past social history, past surgical history and problem list. Problem list updated.  Objective:   Vitals:   01/21/21 1121  BP: (!) 107/56  Pulse: 75  Weight: 180 lb (81.6 kg)    Fetal Status: Fetal Heart Rate (bpm): 162 Fundal Height: 31 cm Movement: Present     General:  Alert, oriented and cooperative. Patient is in no acute distress.  Skin: Skin is warm and dry. No rash noted.   Cardiovascular: Normal heart rate noted  Respiratory: Normal respiratory effort, no problems with respiration noted  Abdomen: Soft, gravid, appropriate for gestational age. Pain/Pressure: Present     Pelvic: Vag. Bleeding: None     Cervical exam performed Dilation: Closed Effacement (%): 0    Extremities: Normal range of motion.  Edema: Trace  Mental Status: Normal mood and affect. Normal behavior. Normal judgment and thought content.   Urinalysis:      Assessment and Plan:  Pregnancy: G3P2002 at [redacted]w[redacted]d  1. Supervision of high risk pregnancy, antepartum -Tdap and flu today per pt request -discussed contraception, info given, pt elects IP Mirena IUD  2. Anxiety -pt prescribed Zofloft, but not currently taking  Zoloft 100mg  daily -pt has not taken for over one week, but would like to restart -advised patient to restart at low dosage -RX Zoloft 25mg  sent to pharmacy for titration of dosage -discussed with patient should use 25mg  for 1-2 weeks, then increase by 25mg  every 1-2 weeks until taking 100mg  daily again; patient aware can call office if experiencing side effects, pt reports Zoloft worked well for her in the past.  3. Alpha thalassemia silent carrier -GC ordered  4. Short interval between pregnancies affecting pregnancy, antepartum  5. LGSIL on Pap smear of cervix -Pap NILM 02/15/2018, needs ppartum  6. [redacted] weeks gestation of pregnancy - today for growth for absent nasal bone/flat profile, EFW 18%, no further scans indicated per MFM  Preterm labor symptoms and general obstetric precautions including but not limited to vaginal bleeding, contractions, leaking of fluid and fetal movement were reviewed in detail with the patient. I discussed the assessment and treatment plan with the patient. The patient was provided an opportunity to ask questions and all were answered. The patient agreed with the plan and demonstrated an understanding of the instructions. The patient was advised to call back or seek an in-person office evaluation/go to [facility]U at [facility] for any urgent or concerning symptoms. Please refer to After Visit Summary for other counseling recommendations.  Return in about 2 weeks (around 02/04/2021) for in-person LOB/APP OK, needs genetic counseling appt.   Lexus Barletta, , NP

## 2021-01-21 NOTE — Progress Notes (Signed)
Pt reports fetal movement with some pressure. Pt reports vaginal discharge, wants to do self swab today for std testing.

## 2021-01-21 NOTE — Patient Instructions (Signed)
Maternity Assessment Unit (MAU)  The Maternity Assessment Unit (MAU) is located at the [facility] at [facility]. The address is: [address] [place], [place], [place] [address]. Please see map below for additional directions.    The Maternity Assessment Unit is designed to help you during your pregnancy, and for up to 6 weeks after delivery, with any pregnancy- or postpartum-related emergencies, if you think you are in labor, or if your water has broken. For example, if you experience nausea and vomiting, vaginal bleeding, severe abdominal or pelvic pain, elevated blood pressure or other problems related to your pregnancy or postpartum time, please come to the Maternity Assessment Unit for assistance.       Tdap (Tetanus, Diphtheria, Pertussis) Vaccine: What You Need to Know 1. Why get vaccinated? Tdap vaccine can prevent tetanus, diphtheria, and pertussis. Diphtheria and pertussis spread from person to person. Tetanus enters the body through cuts or wounds.  TETANUS (T) causes painful stiffening of the muscles. Tetanus can lead to serious health problems, including being unable to open the mouth, having trouble swallowing and breathing, or death.  DIPHTHERIA (D) can lead to difficulty breathing, heart failure, paralysis, or death.  PERTUSSIS (aP), also known as "whooping cough," can cause uncontrollable, violent coughing that makes it hard to breathe, eat, or drink. Pertussis can be extremely serious especially in babies and young children, causing pneumonia, convulsions, brain damage, or death. In teens and adults, it can cause weight loss, loss of bladder control, passing out, and rib fractures from severe coughing. 2. Tdap vaccine Tdap is only for children 7 years and older, adolescents, and adults.  Adolescents should receive a single dose of Tdap, preferably at age 53 or 12 years. Pregnant people should get a dose of Tdap during every pregnancy,  preferably during the early part of the third trimester, to help protect the newborn from pertussis. Infants are most at risk for severe, life-threatening complications from pertussis. Adults who have never received Tdap should get a dose of Tdap. Also, adults should receive a booster dose of either Tdap or Td (a different vaccine that protects against tetanus and diphtheria but not pertussis) every 10 years, or after 5 years in the case of a severe or dirty wound or burn. Tdap may be given at the same time as other vaccines. 3. Talk with your health care provider Tell your vaccine provider if the person getting the vaccine:  Has had an allergic reaction after a previous dose of any vaccine that protects against tetanus, diphtheria, or pertussis, or has any severe, life-threatening allergies  Has had a coma, decreased level of consciousness, or prolonged seizures within 7 days after a previous dose of any pertussis vaccine (DTP, DTaP, or Tdap)  Has seizures or another nervous system problem  Has ever had Guillain-Barr Syndrome (also called "GBS")  Has had severe pain or swelling after a previous dose of any vaccine that protects against tetanus or diphtheria In some cases, your health care provider may decide to postpone Tdap vaccination until a future visit. People with minor illnesses, such as a cold, may be vaccinated. People who are moderately or severely ill should usually wait until they recover before getting Tdap vaccine.  Your health care provider can give you more information. 4. Risks of a vaccine reaction  Pain, redness, or swelling where the shot was given, mild fever, headache, feeling tired, and nausea, vomiting, diarrhea, or stomachache sometimes happen after Tdap vaccination. People sometimes faint  after medical procedures, including vaccination. Tell your provider if you feel dizzy or have vision changes or ringing in the ears.  As with any medicine, there is a very remote  chance of a vaccine causing a severe allergic reaction, other serious injury, or death. 5. What if there is a serious problem? An allergic reaction could occur after the vaccinated person leaves the clinic. If you see signs of a severe allergic reaction (hives, swelling of the face and throat, difficulty breathing, a fast heartbeat, dizziness, or weakness), call 9-1-1 and get the person to the nearest hospital. For other signs that concern you, call your health care provider.  Adverse reactions should be reported to the Vaccine Adverse Event Reporting System (VAERS). Your health care provider will usually file this report, or you can do it yourself. Visit the VAERS website at www.vaers.LAgents.nohhs.gov or call 406-619-53181-408-881-6331. VAERS is only for reporting reactions, and VAERS staff members do not give medical advice. 6. The National Vaccine Injury Compensation Program The [employer] Vaccine Injury Compensation Program (VICP) is a federal program that was created to compensate people who may have been injured by certain vaccines. Claims regarding alleged injury or death due to vaccination have a time limit for filing, which may be as short as two years. Visit the VICP website at SpiritualWord.atwww.hrsa.gov/vaccinecompensation or call (678) 638-28351-(365) 829-4647 to learn about the program and about filing a claim. 7. How can I learn more?  Ask your health care provider.  Call your local or state health department.  Visit the website of the Food and Drug Administration (FDA) for vaccine package inserts and additional information at FinderList.nowww.fda.gov/vaccines-blood-biologics/vaccines.  Contact the Centers for Disease Control and Prevention (CDC): ? Call 78668073481-3604553178 (1-800-CDC-INFO) or ? Visit CDC's website at PicCapture.uywww.cdc.gov/vaccines. Vaccine Information Statement Tdap (Tetanus, Diphtheria, Pertussis) Vaccine (05/30/2020) This information is not intended to replace advice given to you by your health care provider. Make sure you discuss any  questions you have with your health care provider. Document Revised: 06/25/2020 Document Reviewed: 06/25/2020 Elsevier Patient Education  2021 [employer]        Contraception Choices - [email or web address].[facility] Contraception, also called birth control, refers to methods or devices that prevent pregnancy. Hormonal methods Contraceptive implant A contraceptive implant is a thin, plastic tube that contains a hormone that prevents pregnancy. It is different from an intrauterine device (IUD). It is inserted into the upper part of the arm by a health care provider. Implants can be effective for up to 3 years. Progestin-only injections Progestin-only injections are injections of progestin, a synthetic form of the hormone progesterone. They are given every 3 months by a health care provider. Birth control pills Birth control pills are pills that contain hormones that prevent pregnancy. They must be taken once a day, preferably at the same time each day. A prescription is needed to use this method of contraception. Birth control patch The birth control patch contains hormones that prevent pregnancy. It is placed on the skin and must be changed once a week for three weeks and removed on the fourth week. A prescription is needed to use this method of contraception. Vaginal ring A vaginal ring contains hormones that prevent pregnancy. It is placed in the vagina for three weeks and removed on the fourth week. After that, the process is repeated with a new ring. A prescription is needed to use this method of contraception. Emergency contraceptive Emergency contraceptives prevent pregnancy after unprotected sex. They come in pill form and can be taken up to 5  days after sex. They work best the sooner they are taken after having sex. Most emergency contraceptives are available without a prescription. This method should not be used as your only form of birth control.   Barrier methods Female condom A female condom  is a thin sheath that is worn over the penis during sex. Condoms keep sperm from going inside a woman's body. They can be used with a sperm-killing substance (spermicide) to increase their effectiveness. They should be thrown away after one use. Female condom A female condom is a soft, loose-fitting sheath that is put into the vagina before sex. The condom keeps sperm from going inside a woman's body. They should be thrown away after one use. Diaphragm A diaphragm is a soft, dome-shaped barrier. It is inserted into the vagina before sex, along with a spermicide. The diaphragm blocks sperm from entering the uterus, and the spermicide kills sperm. A diaphragm should be left in the vagina for 6-8 hours after sex and removed within 24 hours. A diaphragm is prescribed and fitted by a health care provider. A diaphragm should be replaced every 1-2 years, after giving birth, after gaining more than 15 lb (6.8 kg), and after pelvic surgery. Cervical cap A cervical cap is a round, soft latex or plastic cup that fits over the cervix. It is inserted into the vagina before sex, along with spermicide. It blocks sperm from entering the uterus. The cap should be left in place for 6-8 hours after sex and removed within 48 hours. A cervical cap must be prescribed and fitted by a health care provider. It should be replaced every 2 years. Sponge A sponge is a soft, circular piece of polyurethane foam with spermicide in it. The sponge helps block sperm from entering the uterus, and the spermicide kills sperm. To use it, you make it wet and then insert it into the vagina. It should be inserted before sex, left in for at least 6 hours after sex, and removed and thrown away within 30 hours. Spermicides Spermicides are chemicals that kill or block sperm from entering the cervix and uterus. They can come as a cream, jelly, suppository, foam, or tablet. A spermicide should be inserted into the vagina with an applicator at least  10-15 minutes before sex to allow time for it to work. The process must be repeated every time you have sex. Spermicides do not require a prescription.   Intrauterine contraception Intrauterine device (IUD) An IUD is a T-shaped device that is put in a woman's uterus. There are two types:  Hormone IUD.This type contains progestin, a synthetic form of the hormone progesterone. This type can stay in place for 3-5 years.  Copper IUD.This type is wrapped in copper wire. It can stay in place for 10 years. Permanent methods of contraception Female tubal ligation In this method, a woman's fallopian tubes are sealed, tied, or blocked during surgery to prevent eggs from traveling to the uterus. Hysteroscopic sterilization In this method, a small, flexible insert is placed into each fallopian tube. The inserts cause scar tissue to form in the fallopian tubes and block them, so sperm cannot reach an egg. The procedure takes about 3 months to be effective. Another form of birth control must be used during those 3 months. Female sterilization This is a procedure to tie off the tubes that carry sperm (vasectomy). After the procedure, the man can still ejaculate fluid (semen). Another form of birth control must be used for 3 months after the  procedure. Natural planning methods Natural family planning In this method, a couple does not have sex on days when the woman could become pregnant. Calendar method In this method, the woman keeps track of the length of each menstrual cycle, identifies the days when pregnancy can happen, and does not have sex on those days. Ovulation method In this method, a couple avoids sex during ovulation. Symptothermal method This method involves not having sex during ovulation. The woman typically checks for ovulation by watching changes in her temperature and in the consistency of cervical mucus. Post-ovulation method In this method, a couple waits to have sex until after  ovulation. Where to find more information  Centers for Disease Control and Prevention: FootballExhibition.com.br Summary  Contraception, also called birth control, refers to methods or devices that prevent pregnancy.  Hormonal methods of contraception include implants, injections, pills, patches, vaginal rings, and emergency contraceptives.  Barrier methods of contraception can include female condoms, female condoms, diaphragms, cervical caps, sponges, and spermicides.  There are two types of IUDs (intrauterine devices). An IUD can be put in a woman's uterus to prevent pregnancy for 3-5 years.  Permanent sterilization can be done through a procedure for males and females. Natural family planning methods involve nothaving sex on days when the woman could become pregnant. This information is not intended to replace advice given to you by your health care provider. Make sure you discuss any questions you have with your health care provider. Document Revised: 03/17/2020 Document Reviewed: 03/17/2020 Elsevier Patient Education  2021 [employer].

## 2021-01-22 LAB — CERVICOVAGINAL ANCILLARY ONLY
Bacterial Vaginitis (gardnerella): NEGATIVE
Candida Glabrata: NEGATIVE
Candida Vaginitis: NEGATIVE
Chlamydia: NEGATIVE
Comment: NEGATIVE
Comment: NEGATIVE
Comment: NEGATIVE
Comment: NEGATIVE
Comment: NEGATIVE
Comment: NORMAL
Neisseria Gonorrhea: NEGATIVE
Trichomonas: NEGATIVE

## 2021-01-29 ENCOUNTER — Telehealth: Payer: Self-pay

## 2021-01-29 NOTE — Telephone Encounter (Signed)
Called patient about FMLA, no answer, left vm

## 2021-02-04 ENCOUNTER — Ambulatory Visit (INDEPENDENT_AMBULATORY_CARE_PROVIDER_SITE_OTHER): Payer: Medicaid Other | Admitting: Obstetrics

## 2021-02-04 ENCOUNTER — Other Ambulatory Visit: Payer: Self-pay

## 2021-02-04 ENCOUNTER — Encounter: Payer: Medicaid Other | Admitting: Women's Health

## 2021-02-04 ENCOUNTER — Encounter: Payer: Self-pay | Admitting: Obstetrics

## 2021-02-04 VITALS — BP 104/63 | HR 83 | Wt 183.0 lb

## 2021-02-04 DIAGNOSIS — F419 Anxiety disorder, unspecified: Secondary | ICD-10-CM

## 2021-02-04 DIAGNOSIS — O09899 Supervision of other high risk pregnancies, unspecified trimester: Secondary | ICD-10-CM

## 2021-02-04 DIAGNOSIS — O0990 Supervision of high risk pregnancy, unspecified, unspecified trimester: Secondary | ICD-10-CM

## 2021-02-04 NOTE — Progress Notes (Signed)
ROB, c/o swelling in feet and legs, tingling, numbness from knees down.

## 2021-02-04 NOTE — Progress Notes (Signed)
Subjective:  Andrea Jenkins is a 25 y.o. G3P2002 at [redacted]w[redacted]d being seen today for ongoing prenatal care.  She is currently monitored for the following issues for this high-risk pregnancy and has LGSIL on Pap smear of cervix; Short interval between pregnancies affecting pregnancy, antepartum; Alpha thalassemia silent carrier; Anxiety; and Supervision of high risk pregnancy, antepartum on their problem list.  Patient reports sweling and tingling of feet for the past 2 weeks.  She has been out of work on modified bedrest for the past 2 weeks and the swelling has decreased significantlt.t.  Contractions: Irritability. Vag. Bleeding: None.  Movement: (!) Decreased. Denies leaking of fluid.   The following portions of the patient's history were reviewed and updated as appropriate: allergies, current medications, past family history, past medical history, past social history, past surgical history and problem list. Problem list updated.  Objective:   Vitals:   02/04/21 1116  BP: 104/63  Pulse: 83  Weight: 183 lb (83 kg)    Fetal Status: Fetal Heart Rate (bpm): 153   Movement: (!) Decreased     General:  Alert, oriented and cooperative. Patient is in no acute distress.  Skin: Skin is warm and dry. No rash noted.   Cardiovascular: Normal heart rate noted  Respiratory: Normal respiratory effort, no problems with respiration noted  Abdomen: Soft, gravid, appropriate for gestational age. Pain/Pressure: Present     Pelvic:  Cervical exam deferred        Extremities: Normal range of motion.  Edema: Trace  Mental Status: Normal mood and affect. Normal behavior. Normal judgment and thought content.   Urinalysis:      Assessment and Plan:  Pregnancy: G3P2002 at [redacted]w[redacted]d  1. Supervision of high risk pregnancy, antepartum  2. Short interval between pregnancies affecting pregnancy, antepartum  3. Anxiety - clinically stable  4. Swelling of feet - much improved with rest - continue rest off feet as  much as possible, and keep feet elevated at night  5. Decreased fetal movement - patient left before being monitored.  Contacted her, and she states that she will return for NST   Preterm labor symptoms and general obstetric precautions including but not limited to vaginal bleeding, contractions, leaking of fluid and fetal movement were reviewed in detail with the patient.  Please refer to After Visit Summary for other counseling recommendations.   Return in about 2 weeks (around 02/18/2021) for [facility].   Brock Bad, MD  02/04/21

## 2021-02-13 ENCOUNTER — Other Ambulatory Visit: Payer: Self-pay | Admitting: Obstetrics & Gynecology

## 2021-02-16 ENCOUNTER — Telehealth: Payer: Self-pay

## 2021-02-16 ENCOUNTER — Ambulatory Visit (INDEPENDENT_AMBULATORY_CARE_PROVIDER_SITE_OTHER): Payer: Medicaid Other | Admitting: Obstetrics

## 2021-02-16 ENCOUNTER — Encounter: Payer: Self-pay | Admitting: Obstetrics

## 2021-02-16 ENCOUNTER — Other Ambulatory Visit: Payer: Self-pay

## 2021-02-16 VITALS — BP 107/65 | HR 85 | Wt 182.0 lb

## 2021-02-16 DIAGNOSIS — O0990 Supervision of high risk pregnancy, unspecified, unspecified trimester: Secondary | ICD-10-CM

## 2021-02-16 DIAGNOSIS — O09899 Supervision of other high risk pregnancies, unspecified trimester: Secondary | ICD-10-CM

## 2021-02-16 DIAGNOSIS — F419 Anxiety disorder, unspecified: Secondary | ICD-10-CM

## 2021-02-16 NOTE — Progress Notes (Signed)
Pt states she has had increase swelling in LE.   Please review u/s, pt is concerned about baby being in 18th %tile.   Pt states she is also having problems with depression this pregnancy.  Pt is currently taking Zoloft, feels it isn't really helping. PHQ-9= 15 today.

## 2021-02-16 NOTE — Telephone Encounter (Signed)
Called pt but was unable to LVM; VM full. Pt started maternity leave on her own. Dr. Clearance Coots approved FMLA  I need dates to complete forms

## 2021-02-16 NOTE — Progress Notes (Signed)
Subjective:  Andrea Jenkins is a 25 y.o. G3P2002 at [redacted]w[redacted]d being seen today for ongoing prenatal care.  She is currently monitored for the following issues for this high-risk pregnancy and has LGSIL on Pap smear of cervix; Short interval between pregnancies affecting pregnancy, antepartum; Alpha thalassemia silent carrier; Anxiety; and Supervision of high risk pregnancy, antepartum on their problem list.  Patient reports decreased fetal momement.  Contractions: Not present. Vag. Bleeding: None.  Movement: Present. Denies leaking of fluid.   The following portions of the patient's history were reviewed and updated as appropriate: allergies, current medications, past family history, past medical history, past social history, past surgical history and problem list. Problem list updated.  Objective:   Vitals:   02/16/21 1520  BP: 107/65  Pulse: 85  Weight: 182 lb (82.6 kg)    Fetal Status:     Movement: Present     General:  Alert, oriented and cooperative. Patient is in no acute distress.  Skin: Skin is warm and dry. No rash noted.   Cardiovascular: Normal heart rate noted  Respiratory: Normal respiratory effort, no problems with respiration noted  Abdomen: Soft, gravid, appropriate for gestational age. Pain/Pressure: Present     Pelvic:  Cervical exam deferred        Extremities: Normal range of motion.     Mental Status: Normal mood and affect. Normal behavior. Normal judgment and thought content.   Urinalysis:      Assessment and Plan:  Pregnancy: G3P2002 at [redacted]w[redacted]d  1. Supervision of high risk pregnancy, antepartum  2. Short interval between pregnancies affecting pregnancy, antepartum  3. SGA (small for gestational age) Rx: - [place] MFM OB FOLLOW UP; Future Zoloft   Preterm labor symptoms and general obstetric precautions including but not limited to vaginal bleeding, contractions, leaking of fluid and fetal movement were reviewed in detail with the patient. Please refer to  After Visit Summary for other counseling recommendations.   Return in about 1 week (around 02/23/2021) for [facility].   Brock Bad, MD  02/16/21

## 2021-02-16 NOTE — Telephone Encounter (Signed)
Spoke with pt FMLA forms complete

## 2021-02-17 ENCOUNTER — Ambulatory Visit: Payer: Medicaid Other | Admitting: *Deleted

## 2021-02-17 ENCOUNTER — Ambulatory Visit: Payer: Medicaid Other | Attending: Obstetrics

## 2021-02-17 ENCOUNTER — Encounter: Payer: Self-pay | Admitting: *Deleted

## 2021-02-17 DIAGNOSIS — O99213 Obesity complicating pregnancy, third trimester: Secondary | ICD-10-CM | POA: Diagnosis not present

## 2021-02-17 DIAGNOSIS — O09893 Supervision of other high risk pregnancies, third trimester: Secondary | ICD-10-CM | POA: Diagnosis not present

## 2021-02-17 DIAGNOSIS — E669 Obesity, unspecified: Secondary | ICD-10-CM

## 2021-02-17 DIAGNOSIS — Z3A35 35 weeks gestation of pregnancy: Secondary | ICD-10-CM

## 2021-02-17 DIAGNOSIS — Z362 Encounter for other antenatal screening follow-up: Secondary | ICD-10-CM | POA: Diagnosis not present

## 2021-02-17 DIAGNOSIS — Z148 Genetic carrier of other disease: Secondary | ICD-10-CM

## 2021-02-18 ENCOUNTER — Encounter: Payer: Medicaid Other | Admitting: Women's Health

## 2021-02-23 ENCOUNTER — Encounter (HOSPITAL_COMMUNITY): Payer: Self-pay | Admitting: Obstetrics & Gynecology

## 2021-02-23 ENCOUNTER — Inpatient Hospital Stay (HOSPITAL_COMMUNITY)
Admission: RE | Admit: 2021-02-23 | Discharge: 2021-02-23 | Disposition: A | Discharge status: Home / Self Care | Payer: Medicaid Other | Attending: Obstetrics & Gynecology | Admitting: Obstetrics & Gynecology

## 2021-02-23 ENCOUNTER — Other Ambulatory Visit: Payer: Self-pay

## 2021-02-23 DIAGNOSIS — Z3A36 36 weeks gestation of pregnancy: Secondary | ICD-10-CM | POA: Diagnosis not present

## 2021-02-23 DIAGNOSIS — O26893 Other specified pregnancy related conditions, third trimester: Secondary | ICD-10-CM | POA: Insufficient documentation

## 2021-02-23 DIAGNOSIS — R519 Headache, unspecified: Secondary | ICD-10-CM | POA: Insufficient documentation

## 2021-02-23 DIAGNOSIS — O368130 Decreased fetal movements, third trimester, not applicable or unspecified: Secondary | ICD-10-CM | POA: Diagnosis present

## 2021-02-23 DIAGNOSIS — G44209 Tension-type headache, unspecified, not intractable: Secondary | ICD-10-CM

## 2021-02-23 DIAGNOSIS — Z87891 Personal history of nicotine dependence: Secondary | ICD-10-CM | POA: Insufficient documentation

## 2021-02-23 DIAGNOSIS — Z3689 Encounter for other specified antenatal screening: Secondary | ICD-10-CM

## 2021-02-23 DIAGNOSIS — O99891 Other specified diseases and conditions complicating pregnancy: Secondary | ICD-10-CM

## 2021-02-23 LAB — URINALYSIS, ROUTINE W REFLEX MICROSCOPIC
Bilirubin Urine: NEGATIVE
Glucose, UA: NEGATIVE mg/dL
Hgb urine dipstick: NEGATIVE
Ketones, ur: NEGATIVE mg/dL
Nitrite: NEGATIVE
Protein, ur: NEGATIVE mg/dL
Specific Gravity, Urine: 1.01 (ref 1.005–1.030)
pH: 7 (ref 5.0–8.0)

## 2021-02-23 MED ORDER — BUTALBITAL-APAP-CAFFEINE 50-325-40 MG PO TABS
2.0000 | ORAL_TABLET | Freq: Once | ORAL | Status: AC
  Administered 2021-02-23: 2 via ORAL
  Filled 2021-02-23: qty 2

## 2021-02-23 NOTE — MAU Provider Note (Signed)
History     CSN: [id number]  Arrival date and time: 02/23/21 2011   Event Date/Time   First Provider Initiated Contact with Patient 02/23/21 2106      Chief Complaint  Patient presents with  . Back Pain  . Vaginal Pain  . Headache   Andrea Jenkins is a 25 y.o. G3P2002 at [redacted]w[redacted]d who receives care at [facility].  She presents today for DFM, Back Pain, Pelvic Pressure, and Headache.  She reports fetal movement is present, but "not like it used to be."  She states it has been ongoing for the past few weeks. She reports that fetal movement used to be constant and she feels it has since decreased.  She reports she has felt movement at least 4 times since being placed in the room. She reports she has a limited appetite and finds that she chews ice a lot throughout the day.  She also reports lower back pain that has been present for 3 weeks. She states "rocking back and forth" helps with the pain and sitting up worsens it.  She describes the pain as aching and rates it a 5/10. She reports pelvic pressure that "was bad yesterday."  She states it has been ongoing for about a month and finds it is worse with long standing.  She describes the pain as "a quick shock" that occurs intermittently.  She also states she has pressure. She reports HA of 6/10, but states it was a 8/10 prior to tylenol dosing.  She states the pain is located behind her eyes and describes it as pressure. Patient states she has been experiencing nosebleeds daily, but none today.    OB History    Gravida  3   Para  2   Term  2   Preterm  0   AB  0   Living  2     SAB  0   IAB  0   Ectopic  0   Multiple  0   Live Births  2           Past Medical History:  Diagnosis Date  . Anxiety   . Asthma    pt states she doesn't use inhaler much (04/10/19)  . Depression   . Gonorrhea   . Heart murmur    when younger  . Vaginal Pap smear, abnormal     Past Surgical History:  Procedure Laterality Date  .  CATARACT EXTRACTION    . WISDOM TOOTH EXTRACTION    . WRIST SURGERY      Family History  Problem Relation Age of Onset  . Hypertension Mother   . Miscarriages / [place] Mother   . Hypertension Maternal Grandmother   . Asthma Maternal Grandmother   . Diabetes Maternal Grandmother   . Vision loss Maternal Grandmother   . Mental illness Brother   . Cancer Maternal Aunt   . Mental illness Sister   . Heart disease Neg Hx     Social History   Tobacco Use  . Smoking status: Former Smoker    Packs/day: 0.25    Types: Cigars    Quit date: 03/11/2017    Years since quitting: 3.9  . Smokeless tobacco: Never Used  Vaping Use  . Vaping Use: Never used  Substance Use Topics  . Alcohol use: No    Alcohol/week: 0.0 standard drinks    Comment: mimosa on saturday. 04/06/19  . Drug use: Not Currently    Types: Marijuana  Allergies:  Allergies  Allergen Reactions  . Peach [Prunus Persica] Hives    Medications Prior to Admission  Medication Sig Dispense Refill Last Dose  . Elastic Bandages & Supports (COMFORT FIT MATERNITY SUPP MED) MISC Maternity support belt.  Fit to patient. (Patient taking differently: Maternity support belt.  Fit to patient.) 1 each 0   . ferrous sulfate 325 (65 FE) MG tablet Take 1 tablet (325 mg total) by mouth 2 (two) times daily with a meal. 60 tablet 5   . omeprazole (PRILOSEC) 20 MG capsule Take 1 capsule (20 mg total) by mouth 2 (two) times daily before a meal. 60 capsule 0   . Prenat-Fe Poly-Methfol-FA-DHA (VITAFOL ULTRA) 29-0.6-0.4-200 MG CAPS Take 1 tablet by mouth daily. 30 capsule 12   . sertraline (ZOLOFT) 100 MG tablet Take 1 tablet (100 mg total) by mouth daily. 30 tablet 5   . sertraline (ZOLOFT) 25 MG tablet Take 1 tablet (25 mg total) by mouth daily. 30 tablet 2     Review of Systems  Constitutional: Negative for chills and fever.  Respiratory: Negative for cough and shortness of breath.   Gastrointestinal: Positive for nausea (None  currently). Negative for abdominal pain and vomiting.  Genitourinary: Positive for pelvic pain (Pressure). Negative for difficulty urinating, dysuria, vaginal bleeding and vaginal discharge.  Musculoskeletal: Positive for back pain (Lower back).  Neurological: Positive for headaches.   Physical Exam   Blood pressure (!) 101/56, pulse 76, temperature 98.1 F (36.7 C), temperature source Oral, resp. rate 20, height 5' 3.5" (1.613 m), weight 83.8 kg, last menstrual period 06/13/2020, not currently breastfeeding.  Physical Exam Constitutional:      Appearance: She is well-developed.  HENT:     Head: Normocephalic and atraumatic.  Eyes:     Conjunctiva/sclera: Conjunctivae normal.  Cardiovascular:     Rate and Rhythm: Normal rate and regular rhythm.  Pulmonary:     Effort: Pulmonary effort is normal. No respiratory distress.     Breath sounds: Normal breath sounds.  Abdominal:     Palpations: Abdomen is soft.     Tenderness: There is no abdominal tenderness.     Comments: Gravid, Appears AGA  Musculoskeletal:     Cervical back: Normal range of motion.  Skin:    General: Skin is warm and dry.  Neurological:     Mental Status: She is alert and oriented to person, place, and time.  Psychiatric:        Mood and Affect: Mood is depressed.        Speech: Speech normal.        Behavior: Behavior normal.     Fetal Assessment 145 bpm, Mod Var, -Decels, +15x15 Accels Toco: No ctx graphed  MAU Course   Results for orders placed or performed during the hospital encounter of 02/23/21 (from the past 24 hour(s))  Urinalysis, Routine w reflex microscopic Urine, Clean Catch     Status: Abnormal   Collection Time: 02/23/21  8:46 PM  Result Value Ref Range   Color, Urine YELLOW YELLOW   APPearance CLEAR CLEAR   Specific Gravity, Urine 1.010 1.005 - 1.030   pH 7.0 5.0 - 8.0   Glucose, UA NEGATIVE NEGATIVE mg/dL   Hgb urine dipstick NEGATIVE NEGATIVE   Bilirubin Urine NEGATIVE NEGATIVE    Ketones, ur NEGATIVE NEGATIVE mg/dL   Protein, ur NEGATIVE NEGATIVE mg/dL   Nitrite NEGATIVE NEGATIVE   Leukocytes,Ua SMALL (A) NEGATIVE   RBC / HPF 0-5 0 - 5 RBC/hpf  WBC, UA 6-10 0 - 5 WBC/hpf   Bacteria, UA RARE (A) NONE SEEN   Squamous Epithelial / LPF 0-5 0 - 5   No results found.  MDM PE Labs: UA EFM  Assessment and Plan  25 year old G3P2002  SIUP at 36.3weeks Cat I FT DFM Headache  -Reviewed fetal movement.  Informed that fetal movement and strength will seem decreased as fetus grows and uterine space becomes limited.   -Discussed fetal movement patterns and how to observe. -Encouraged proper hydration and nutrition.  -Reassured that pelvic pressure and "lightening crotch" are normal complaints during pregnancy.  -Informed that pelvic exam would be deferred. Encouraged to inform provider at visit if discharge becomes of concern. -Encouraged usage of belly support band and frequent rest when possible.  -Discussed epistaxis during pregnancy.  Reassured that normal occurrence and discussed management and avoidance measures.  -POC Reviewed -Exam performed -Will give Fioricet for HA. -Will give warm compresses for back pain.  -NST Reactive   Cherre Robins MSN, CNM 02/23/2021, 9:06 PM   Reassessment (10:03 PM)  -Patient reports improvement in HA with Fioricet dosing. -Discussed follow up with [facility] as scheduled. -Offered medication for home usage and patient declines. -Encouraged to consider restarting Zoloft. -Encouraged to call or return to MAU if symptoms worsen or with the onset of new symptoms. -Discharged to home in stable condition.  Cherre Robins MSN, CNM Advanced Practice Provider, [facility] [employer] [facility]

## 2021-02-23 NOTE — Discharge Instructions (Signed)
Nosebleed, Adult A nosebleed is when blood comes out of the nose. Nosebleeds are common. Usually, they are not a sign of a serious condition. Nosebleeds can happen if a blood vessel in your nose starts to bleed or if the lining of your nose (mucous membrane) cracks. They are commonly caused by:  Allergies.  Colds.  Picking your nose.  Blowing your nose too hard.  An injury from sticking an object into your nose or getting hit in the nose.  Dry or cold air. Less common causes of nosebleeds include:  Toxic fumes.  Something abnormal in the nose or in the air-filled spaces in the bones of the face (sinuses).  Growths in the nose, such as polyps.  Blood thinners or conditions that cause blood to clot slowly.  Certain illnesses or procedures that irritate or dry out the nasal passages. Follow these instructions at home: When you have a nosebleed:  Sit down and tilt your head slightly forward.  Use a clean towel or tissue to pinch your nostrils under the bony part of your nose. After 5 minutes, let go of your nose and see if bleeding starts again. Do not release pressure before that time. If there is still bleeding, repeat the pinching and holding for 5 minutes or until the bleeding stops.  Do not place tissues or gauze in the nose to stop the bleeding.  Avoid lying down and avoid tilting your head backward. That may make blood collect in the throat and cause gagging or coughing.  Use a nasal spray decongestant to help with a nosebleed as told by your health care provider.   After a nosebleed:  Avoid blowing your nose or sniffing for a number of hours.  Avoid straining, lifting, or bending at the waist for several days. You may go back to other normal activities as you are able.  If you are taking aspirin or blood thinners and you have nosebleeds, talk to your health care provider. These medicines make bleeding more likely. ? Ask your health care provider if you should stop  taking the medicines or if you should adjust the dose. ? Do not stop taking medicines that your health care provider has recommended unless he or she tells you to stop taking them.  If your nosebleed was caused by dry mucous membranes, use over-the-counter saline nasal spray or gel and a humidifier as told by your health care provider. This will keep the mucous membranes moist and allow them to heal. If you need to use one of these products: ? Choose one that is water-soluble. ? Use only as much as you need and use it only as often as needed. ? Do not lie down right after you use it.  If you get nosebleeds often, talk with your health care provider about medical treatments. Options may include: ? Nasal cautery. This treatment stops and prevents nosebleeds by using a chemical swab or electrical device to lightly burn tiny blood vessels inside the nose. ? Nasal packing. A gauze or other material is placed in the nose to keep constant pressure on the bleeding area. Contact a health care provider if you:  Have a fever.  Get nosebleeds often or more often than usual.  Bruise very easily.  Have a nosebleed from having something stuck in your nose.  Have bleeding in your mouth.  Vomit or cough up brown material.  Have a nosebleed after you start a new medicine. Get help right away if:  You have a  nosebleed after a fall or a head injury.  Your nosebleed does not go away after 20 minutes.  You feel dizzy or weak.  You have unusual bleeding from other parts of your body.  You have unusual bruising on other parts of your body.  You become sweaty.  You vomit blood. Summary  A nosebleed is when blood comes out of the nose. Common causes include allergies, an injury to the nose, or cold or dry air.  Initial treatment includes applying pressure for 5 minutes.  Moisturizing the nose with saline nasal spray or gel after a nosebleed may help prevent future bleeding.  Get help right  away if your nosebleed does not go away after 20 minutes. This information is not intended to replace advice given to you by your health care provider. Make sure you discuss any questions you have with your health care provider. Document Revised: 08/09/2019 Document Reviewed: 08/09/2019 Elsevier Patient Education  2021 Elsevier Inc. Tension Headache, Adult A tension headache is a feeling of pain, pressure, or aching over the front and sides of the head. The pain can be dull, or it can feel tight. There are two types of tension headache:  Episodic tension headache. This is when the headaches happen fewer than 15 days a month.  Chronic tension headache. This is when the headaches happen more than 15 days a month during a 4-month period. A tension headache can last from 30 minutes to several days. It is the most common kind of headache. Tension headaches are not normally associated with nausea or vomiting, and they do not get worse with physical activity. What are the causes? The exact cause of this condition is not known. Tension headaches are often triggered by stress, anxiety, or depression. Other triggers may include:  Alcohol.  Too much caffeine or caffeine withdrawal.  Respiratory infections, such as colds, flu, or sinus infections.  Dental problems or teeth clenching.  Fatigue.  Holding your head and neck in the same position for a long period of time, such as while using a computer.  Smoking.  Arthritis of the neck. What are the signs or symptoms? Symptoms of this condition include:  A feeling of pressure or tightness around the head.  Dull, aching head pain.  Pain over the front and sides of the head.  Tenderness in the muscles of the head, neck, and shoulders. How is this diagnosed? This condition may be diagnosed based on your symptoms, your medical history, and a physical exam. If your symptoms are severe or unusual, you may have imaging tests, such as a CT scan or  an MRI of your head. Your vision may also be checked. How is this treated? This condition may be treated with lifestyle changes and with medicines that help relieve symptoms. Follow these instructions at home: Managing pain  Take over-the-counter and prescription medicines only as told by your health care provider.  When you have a headache, lie down in a dark, quiet room.  If directed, put ice on your head and neck. To do this: ? Put ice in a plastic bag. ? Place a towel between your skin and the bag. ? Leave the ice on for 20 minutes, 2-3 times a day. ? Remove the ice if your skin turns bright red. This is very important. If you cannot feel pain, heat, or cold, you have a greater risk of damage to the area.  If directed, apply heat to the back of your neck as often as told  by your health care provider. Use the heat source that your health care provider recommends, such as a moist heat pack or a heating pad. ? Place a towel between your skin and the heat source. ? Leave the heat on for 20-30 minutes. ? Remove the heat if your skin turns bright red. This is especially important if you are unable to feel pain, heat, or cold. You have a greater risk of getting burned. Eating and drinking  Eat meals on a regular schedule.  If you drink alcohol: ? Limit how much you have to:  0-1 drink a day for women who are not pregnant.  0-2 drinks a day for men. ? Know how much alcohol is in your drink. In the [place] one drink equals one 12 oz bottle of beer (355 mL), one 5 oz glass of wine (148 mL), or one 1 oz glass of hard liquor (44 mL).  Drink enough fluid to keep your urine pale yellow.  Decrease your caffeine intake, or stop using caffeine. Lifestyle  Get 7-9 hours of sleep each night, or get the amount of sleep recommended by your health care provider.  At bedtime, remove computers, phones, and tablets from your room.  Find ways to manage your stress. This may  include: ? Exercise. ? Deep breathing exercises. ? Yoga. ? Listening to music. ? Positive mental imagery.  Try to sit up straight and avoid tensing your muscles.  Do not use any products that contain nicotine or tobacco. These include cigarettes, chewing tobacco, and vaping devices, such as e-cigarettes. If you need help quitting, ask your health care provider. General instructions  Avoid any headache triggers. Keep a journal to help find out what may trigger your headaches. For example, write down: ? What you eat and drink. ? How much sleep you get. ? Any change to your diet or medicines.  Keep all follow-up visits. This is important.   Contact a health care provider if:  Your headache does not get better.  Your headache comes back.  You are sensitive to sounds, light, or smells because of a headache.  You have nausea or you vomit.  Your stomach hurts. Get help right away if:  You suddenly develop a severe headache, along with any of the following: ? A stiff neck. ? Nausea and vomiting. ? Confusion. ? Weakness in one part or one side of your body. ? Double vision or loss of vision. ? Shortness of breath. ? Rash. ? Unusual sleepiness. ? Fever or chills. ? Trouble speaking. ? Pain in your eye or ear. ? Trouble walking or balancing. ? Feeling faint or passing out. Summary  A tension headache is a feeling of pain, pressure, or aching over the front and sides of the head.  A tension headache can last from 30 minutes to several days. It is the most common kind of headache.  This condition may be diagnosed based on your symptoms, your medical history, and a physical exam.  This condition may be treated with lifestyle changes and with medicines that help relieve symptoms. This information is not intended to replace advice given to you by your health care provider. Make sure you discuss any questions you have with your health care provider. Document Revised: 07/10/2020  Document Reviewed: 07/10/2020 Elsevier Patient Education  2021 [employer].

## 2021-02-23 NOTE — MAU Note (Signed)
PT SAYS BAD H/A- STARTED SAT EVENING - TOOK XS  TYLENOL 1 TAB AT 1230-  SOME RELIEF. NOSE BLEEDING SINCE SAT . LAST TIME WAS 8AM- WHEN BLEW NOSE WHEN MIXED WITH MUCUS . HAS LOWER BACK PAIN- STARTED  2 WEEKS AGO. FEELS PRESSURE IN VAG . [facility]- -HAS DR APPOINTMENT ON WED.

## 2021-02-25 ENCOUNTER — Other Ambulatory Visit: Payer: Self-pay

## 2021-02-25 ENCOUNTER — Encounter: Payer: Medicaid Other | Admitting: Obstetrics and Gynecology

## 2021-02-25 ENCOUNTER — Encounter: Payer: Medicaid Other | Admitting: Women's Health

## 2021-02-25 ENCOUNTER — Encounter: Payer: Self-pay | Admitting: Obstetrics and Gynecology

## 2021-02-25 ENCOUNTER — Other Ambulatory Visit (HOSPITAL_COMMUNITY)
Admission: RE | Admit: 2021-02-25 | Discharge: 2021-02-25 | Disposition: A | Discharge status: Home / Self Care | Payer: Medicaid Other | Source: Ambulatory Visit | Attending: Obstetrics and Gynecology | Admitting: Obstetrics and Gynecology

## 2021-02-25 ENCOUNTER — Ambulatory Visit (INDEPENDENT_AMBULATORY_CARE_PROVIDER_SITE_OTHER): Payer: Self-pay | Admitting: Obstetrics and Gynecology

## 2021-02-25 VITALS — BP 102/67 | HR 80 | Wt 184.0 lb

## 2021-02-25 DIAGNOSIS — O0990 Supervision of high risk pregnancy, unspecified, unspecified trimester: Secondary | ICD-10-CM | POA: Diagnosis not present

## 2021-02-25 DIAGNOSIS — F419 Anxiety disorder, unspecified: Secondary | ICD-10-CM

## 2021-02-25 LAB — OB RESULTS CONSOLE GC/CHLAMYDIA: Gonorrhea: NEGATIVE

## 2021-02-25 NOTE — Progress Notes (Signed)
   PRENATAL VISIT NOTE  Subjective:  Andrea Jenkins is a 25 y.o. G3P2002 at [redacted]w[redacted]d being seen today for ongoing prenatal care.  She is currently monitored for the following issues for this low-risk pregnancy and has LGSIL on Pap smear of cervix; Short interval between pregnancies affecting pregnancy, antepartum; Alpha thalassemia silent carrier; Anxiety; and Supervision of high risk pregnancy, antepartum on their problem list.  Patient reports no complaints.  Contractions: Irregular. Vag. Bleeding: None.  Movement: Present. Denies leaking of fluid.   The following portions of the patient's history were reviewed and updated as appropriate: allergies, current medications, past family history, past medical history, past social history, past surgical history and problem list.   Objective:   Vitals:   02/25/21 1001  BP: 102/67  Pulse: 80  Weight: 184 lb (83.5 kg)    Fetal Status: Fetal Heart Rate (bpm): 130 Fundal Height: 36 cm Movement: Present  Presentation: Vertex  General:  Alert, oriented and cooperative. Patient is in no acute distress.  Skin: Skin is warm and dry. No rash noted.   Cardiovascular: Normal heart rate noted  Respiratory: Normal respiratory effort, no problems with respiration noted  Abdomen: Soft, gravid, appropriate for gestational age.  Pain/Pressure: Present     Pelvic: Cervical exam performed in the presence of a chaperone Dilation: 1 Effacement (%): 50 Station: -3  Extremities: Normal range of motion.  Edema: Trace  Mental Status: Normal mood and affect. Normal behavior. Normal judgment and thought content.   Assessment and Plan:  Pregnancy: G3P2002 at [redacted]w[redacted]d 1. Supervision of high risk pregnancy, antepartum Patient is doing well without complaints Cultures today Patient desires IOL by 39 weeks, if favorable cervix  2. Anxiety Continue zoloft  Preterm labor symptoms and general obstetric precautions including but not limited to vaginal bleeding,  contractions, leaking of fluid and fetal movement were reviewed in detail with the patient. Please refer to After Visit Summary for other counseling recommendations.   Return in about 1 week (around 03/04/2021) for in person, ROB, Low risk.  No future appointments.  Catalina Antigua, MD

## 2021-02-26 LAB — CERVICOVAGINAL ANCILLARY ONLY
Chlamydia: NEGATIVE
Comment: NEGATIVE
Comment: NORMAL
Neisseria Gonorrhea: NEGATIVE

## 2021-02-27 LAB — STREP GP B NAA: Strep Gp B NAA: NEGATIVE

## 2021-03-02 ENCOUNTER — Other Ambulatory Visit: Payer: Self-pay

## 2021-03-02 ENCOUNTER — Ambulatory Visit (INDEPENDENT_AMBULATORY_CARE_PROVIDER_SITE_OTHER): Payer: Medicaid Other | Admitting: Obstetrics

## 2021-03-02 ENCOUNTER — Encounter: Payer: Self-pay | Admitting: Obstetrics

## 2021-03-02 VITALS — BP 100/62 | HR 71 | Wt 185.0 lb

## 2021-03-02 DIAGNOSIS — O09899 Supervision of other high risk pregnancies, unspecified trimester: Secondary | ICD-10-CM

## 2021-03-02 DIAGNOSIS — O0990 Supervision of high risk pregnancy, unspecified, unspecified trimester: Secondary | ICD-10-CM

## 2021-03-02 DIAGNOSIS — F419 Anxiety disorder, unspecified: Secondary | ICD-10-CM

## 2021-03-02 NOTE — Progress Notes (Signed)
Subjective:  Andrea Jenkins is a 25 y.o. G3P2002 at [redacted]w[redacted]d being seen today for ongoing prenatal care.  She is currently monitored for the following issues for this low-risk pregnancy and has LGSIL on Pap smear of cervix; Short interval between pregnancies affecting pregnancy, antepartum; Alpha thalassemia silent carrier; Anxiety; and Supervision of high risk pregnancy, antepartum on their problem list.  Patient reports occasional contractions.  Contractions: Irregular. Vag. Bleeding: None.  Movement: Present. Denies leaking of fluid.   The following portions of the patient's history were reviewed and updated as appropriate: allergies, current medications, past family history, past medical history, past social history, past surgical history and problem list. Problem list updated.  Objective:   Vitals:   03/02/21 0926  BP: 100/62  Pulse: 71  Weight: 185 lb (83.9 kg)    Fetal Status:     Movement: Present     General:  Alert, oriented and cooperative. Patient is in no acute distress.  Skin: Skin is warm and dry. No rash noted.   Cardiovascular: Normal heart rate noted  Respiratory: Normal respiratory effort, no problems with respiration noted  Abdomen: Soft, gravid, appropriate for gestational age. Pain/Pressure: Present     Pelvic:  Cervical exam deferred        Extremities: Normal range of motion.     Mental Status: Normal mood and affect. Normal behavior. Normal judgment and thought content.   Urinalysis:      Assessment and Plan:  Pregnancy: G3P2002 at 108w3d  1. Supervision of high risk pregnancy, antepartum  2. Short interval between pregnancies affecting pregnancy, antepartum  3. Anxiety   Term labor symptoms and general obstetric precautions including but not limited to vaginal bleeding, contractions, leaking of fluid and fetal movement were reviewed in detail with the patient. Please refer to After Visit Summary for other counseling recommendations.   Return in about  1 week (around 03/09/2021) for ROB.   Brock Bad, MD  03/02/21

## 2021-03-06 ENCOUNTER — Encounter (HOSPITAL_COMMUNITY): Payer: Self-pay | Admitting: *Deleted

## 2021-03-06 ENCOUNTER — Ambulatory Visit (INDEPENDENT_AMBULATORY_CARE_PROVIDER_SITE_OTHER): Payer: Medicaid Other | Admitting: Family Medicine

## 2021-03-06 ENCOUNTER — Telehealth (HOSPITAL_COMMUNITY): Payer: Self-pay | Admitting: *Deleted

## 2021-03-06 ENCOUNTER — Other Ambulatory Visit: Payer: Self-pay

## 2021-03-06 VITALS — BP 105/67 | HR 75 | Temp 98.3°F | Wt 184.3 lb

## 2021-03-06 DIAGNOSIS — O219 Vomiting of pregnancy, unspecified: Secondary | ICD-10-CM

## 2021-03-06 DIAGNOSIS — D563 Thalassemia minor: Secondary | ICD-10-CM

## 2021-03-06 DIAGNOSIS — O0990 Supervision of high risk pregnancy, unspecified, unspecified trimester: Secondary | ICD-10-CM

## 2021-03-06 DIAGNOSIS — O09899 Supervision of other high risk pregnancies, unspecified trimester: Secondary | ICD-10-CM

## 2021-03-06 DIAGNOSIS — R87612 Low grade squamous intraepithelial lesion on cytologic smear of cervix (LGSIL): Secondary | ICD-10-CM

## 2021-03-06 NOTE — Patient Instructions (Signed)
Counseled patient on routine COVID-19 testing for all admission to inpatient units whether direct admit or from MAU/ED. Reviewed reasons for testing including identifying cases and protecting patients/staff from possible infection. Reassured patient that separation from newborn is not required for COVID+ tests in asymptomatic patients and that the plan of care will be created with Peds through shared decision-making. One support person is allowed for all admitted patients regardless of COVID test results. All patient questions answered.

## 2021-03-06 NOTE — Progress Notes (Signed)
   PRENATAL VISIT NOTE  Subjective:  ADALINE TREJOS is a 25 y.o. G3P2002 at [redacted]w[redacted]d being seen today for ongoing prenatal care.  She is currently monitored for the following issues for this high-risk pregnancy and has LGSIL on Pap smear of cervix; Short interval between pregnancies affecting pregnancy, antepartum; Alpha thalassemia silent carrier; Anxiety; and Supervision of high risk pregnancy, antepartum on their problem list.  Patient reports Nausea, vomiting and diarrhea with associated HA. No family members are ill. Denies known exposure to COVID 19.  Contractions: Irregular. Vag. Bleeding: None.  Movement: Present. Denies leaking of fluid.   The following portions of the patient's history were reviewed and updated as appropriate: allergies, current medications, past family history, past medical history, past social history, past surgical history and problem list.   Objective:   Vitals:   03/06/21 0924  BP: 105/67  Pulse: 75  Temp: 98.3 F (36.8 C)  Weight: 184 lb 4.8 oz (83.6 kg)    Fetal Status: Fetal Heart Rate (bpm): 136 Fundal Height: 38 cm Movement: Present  Presentation: Vertex  General:  Alert, oriented and cooperative. Patient is in no acute distress.  Skin: Skin is warm and dry. No rash noted.   Cardiovascular: Normal heart rate noted  Respiratory: Normal respiratory effort, no problems with respiration noted  Abdomen: Soft, gravid, appropriate for gestational age.  Pain/Pressure: Absent     Pelvic: Cervical exam deferred Dilation: 3 Effacement (%): 50 Station: -3  Extremities: Normal range of motion.  Edema: Trace  Mental Status: Normal mood and affect. Normal behavior. Normal judgment and thought content.   Assessment and Plan:  Pregnancy: G3P2002 at [redacted]w[redacted]d 1. Supervision of high risk pregnancy, antepartum Up to date  Reviewed 36 wk labs DIscussed IOL, patient was told she would be induced 5/20.  She is favorable today and desired IOL. Reviewed r/b of elective  IOL and patient wishes to proceed. I have called LD and the patient is scheduled for 5/20 in the AM and knows to expect a call.   2. Short interval between pregnancies affecting pregnancy, antepartum  3. Alpha thalassemia silent carrier  4. LGSIL on Pap smear of cervix Needs pap, was not done at initial prenatal and patient now advanced pregnancy Declines until postpartum  5. N/V/D x3 days Reviewed sx can be consistent with COVID 19  Recommended testing as the patient is term and will be entering hospital. If COVID pos now she will not need additional testing and to be on precautions as long as she is > 5d from onset of sx.    Term labor symptoms and general obstetric precautions including but not limited to vaginal bleeding, contractions, leaking of fluid and fetal movement were reviewed in detail with the patient. Please refer to After Visit Summary for other counseling recommendations.   Return in about 6 weeks (around 04/17/2021) for Postpartum visit.  Future Appointments  Date Time Provider Department Center  03/13/2021  8:55 AM [facility] SCHED ROOM [facility] None    Federico Flake, MD

## 2021-03-06 NOTE — Progress Notes (Signed)
Pt recently seen Monday for ROB.  Made appt today for problem visit  Patient reports N/V/D x 2 days. Denies fever. Reports no other family members with symptoms. Also c/o headache x 2 days. Better today. Also c/o shooting pain in lower back.

## 2021-03-06 NOTE — Telephone Encounter (Signed)
Preadmission screen  

## 2021-03-07 ENCOUNTER — Inpatient Hospital Stay (HOSPITAL_COMMUNITY)
Admission: AD | Admit: 2021-03-07 | Discharge: 2021-03-07 | Disposition: A | Discharge status: Home / Self Care | Payer: Medicaid Other | Attending: Obstetrics & Gynecology | Admitting: Obstetrics & Gynecology

## 2021-03-07 ENCOUNTER — Other Ambulatory Visit: Payer: Self-pay

## 2021-03-07 ENCOUNTER — Encounter (HOSPITAL_COMMUNITY): Payer: Self-pay | Admitting: Obstetrics & Gynecology

## 2021-03-07 DIAGNOSIS — Z3A38 38 weeks gestation of pregnancy: Secondary | ICD-10-CM | POA: Insufficient documentation

## 2021-03-07 DIAGNOSIS — O479 False labor, unspecified: Secondary | ICD-10-CM

## 2021-03-07 DIAGNOSIS — O471 False labor at or after 37 completed weeks of gestation: Secondary | ICD-10-CM | POA: Insufficient documentation

## 2021-03-07 NOTE — Progress Notes (Signed)
S: [name] is a 25 y.o. G3P2002 at [redacted]w[redacted]d  who presents to MAU today for labor evaluation.     Cervical exam by RN:  Dilation: 3 Effacement (%): 60 Station: -2 Presentation: Vertex Exam by:: AMontez Morita RN  Fetal Monitoring: Baseline: 140bpm Variability: moderate Accelerations: present Decelerations: absent Contractions: q1-5 min  MDM Discussed patient with RN. NST reviewed.   A: SIUP at [redacted]w[redacted]d  False labor  P: Discharge home Labor precautions and kick counts included in AVS Patient to follow-up with OBGYN as scheduled  Patient may return to MAU as needed or when in labor   Alric Seton, MD 03/07/2021 11:19 PM

## 2021-03-07 NOTE — MAU Note (Signed)
Pt reports SVE in office yesterday 3cm, contractions now 5-6 minutes. ? Leaking fluid. Reports good fetal movement.

## 2021-03-07 NOTE — Discharge Instructions (Signed)
Rosen's Emergency Medicine: Concepts and Clinical Practice (9th ed., pp. 2296- 2312). Elsevier.">  Braxton Hicks Contractions Contractions of the uterus can occur throughout pregnancy, but they are not always a sign that you are in labor. You may have practice contractions called Braxton Hicks contractions. These false labor contractions are sometimes confused with true labor. What are Braxton Hicks contractions? Braxton Hicks contractions are tightening movements that occur in the muscles of the uterus before labor. Unlike true labor contractions, these contractions do not result in opening (dilation) and thinning of the cervix. Toward the end of pregnancy (32-34 weeks), Braxton Hicks contractions can happen more often and may become stronger. These contractions are sometimes difficult to tell apart from true labor because they can be very uncomfortable. You should not feel embarrassed if you go to the hospital with false labor. Sometimes, the only way to tell if you are in true labor is for your health care provider to look for changes in the cervix. The health care provider will do a physical exam and may monitor your contractions. If you are not in true labor, the exam should show that your cervix is not dilating and your water has not broken. If there are no other health problems associated with your pregnancy, it is completely safe for you to be sent home with false labor. You may continue to have Braxton Hicks contractions until you go into true labor. How to tell the difference between true labor and false labor True labor  Contractions last 30-70 seconds.  Contractions become very regular.  Discomfort is usually felt in the top of the uterus, and it spreads to the lower abdomen and low back.  Contractions do not go away with walking.  Contractions usually become more intense and increase in frequency.  The cervix dilates and gets thinner. False labor  Contractions are usually shorter  and not as strong as true labor contractions.  Contractions are usually irregular.  Contractions are often felt in the front of the lower abdomen and in the groin.  Contractions may go away when you walk around or change positions while lying down.  Contractions get weaker and are shorter-lasting as time goes on.  The cervix usually does not dilate or become thin. Follow these instructions at home:  Take over-the-counter and prescription medicines only as told by your health care provider.  Keep up with your usual exercises and follow other instructions from your health care provider.  Eat and drink lightly if you think you are going into labor.  If Braxton Hicks contractions are making you uncomfortable: ? Change your position from lying down or resting to walking, or change from walking to resting. ? Sit and rest in a tub of warm water. ? Drink enough fluid to keep your urine pale yellow. Dehydration may cause these contractions. ? Do slow and deep breathing several times an hour.  Keep all follow-up prenatal visits as told by your health care provider. This is important.   Contact a health care provider if:  You have a fever.  You have continuous pain in your abdomen. Get help right away if:  Your contractions become stronger, more regular, and closer together.  You have fluid leaking or gushing from your vagina.  You pass blood-tinged mucus (bloody show).  You have bleeding from your vagina.  You have low back pain that you never had before.  You feel your baby's head pushing down and causing pelvic pressure.  Your baby is not moving inside   you as much as it used to. Summary  Contractions that occur before labor are called Braxton Hicks contractions, false labor, or practice contractions.  Braxton Hicks contractions are usually shorter, weaker, farther apart, and less regular than true labor contractions. True labor contractions usually become progressively  stronger and regular, and they become more frequent.  Manage discomfort from Braxton Hicks contractions by changing position, resting in a warm bath, drinking plenty of water, or practicing deep breathing. This information is not intended to replace advice given to you by your health care provider. Make sure you discuss any questions you have with your health care provider. Document Revised: 09/23/2017 Document Reviewed: 02/24/2017 Elsevier Patient Education  2021 Elsevier Inc.  

## 2021-03-09 ENCOUNTER — Encounter: Payer: Medicaid Other | Admitting: Obstetrics & Gynecology

## 2021-03-09 ENCOUNTER — Other Ambulatory Visit: Payer: Self-pay

## 2021-03-09 ENCOUNTER — Inpatient Hospital Stay (HOSPITAL_COMMUNITY)
Admission: AD | Admit: 2021-03-09 | Discharge: 2021-03-12 | DRG: 807 | Disposition: A | Discharge status: Home / Self Care | Payer: Medicaid Other | Attending: Obstetrics and Gynecology | Admitting: Obstetrics and Gynecology

## 2021-03-09 ENCOUNTER — Inpatient Hospital Stay (HOSPITAL_COMMUNITY): Payer: Medicaid Other | Admitting: Anesthesiology

## 2021-03-09 ENCOUNTER — Encounter (HOSPITAL_COMMUNITY): Payer: Self-pay | Admitting: Family Medicine

## 2021-03-09 DIAGNOSIS — O09899 Supervision of other high risk pregnancies, unspecified trimester: Secondary | ICD-10-CM

## 2021-03-09 DIAGNOSIS — O99344 Other mental disorders complicating childbirth: Secondary | ICD-10-CM | POA: Diagnosis not present

## 2021-03-09 DIAGNOSIS — Z20822 Contact with and (suspected) exposure to covid-19: Secondary | ICD-10-CM | POA: Diagnosis present

## 2021-03-09 DIAGNOSIS — D563 Thalassemia minor: Secondary | ICD-10-CM | POA: Diagnosis present

## 2021-03-09 DIAGNOSIS — Z3A00 Weeks of gestation of pregnancy not specified: Secondary | ICD-10-CM | POA: Diagnosis not present

## 2021-03-09 DIAGNOSIS — O4292 Full-term premature rupture of membranes, unspecified as to length of time between rupture and onset of labor: Principal | ICD-10-CM | POA: Diagnosis present

## 2021-03-09 DIAGNOSIS — F32A Depression, unspecified: Secondary | ICD-10-CM | POA: Diagnosis present

## 2021-03-09 DIAGNOSIS — Z30430 Encounter for insertion of intrauterine contraceptive device: Secondary | ICD-10-CM | POA: Diagnosis not present

## 2021-03-09 DIAGNOSIS — Z87891 Personal history of nicotine dependence: Secondary | ICD-10-CM

## 2021-03-09 DIAGNOSIS — F419 Anxiety disorder, unspecified: Secondary | ICD-10-CM | POA: Diagnosis present

## 2021-03-09 DIAGNOSIS — O9902 Anemia complicating childbirth: Secondary | ICD-10-CM | POA: Diagnosis not present

## 2021-03-09 DIAGNOSIS — D573 Sickle-cell trait: Secondary | ICD-10-CM | POA: Diagnosis not present

## 2021-03-09 DIAGNOSIS — Z3A38 38 weeks gestation of pregnancy: Secondary | ICD-10-CM | POA: Diagnosis not present

## 2021-03-09 DIAGNOSIS — D649 Anemia, unspecified: Secondary | ICD-10-CM | POA: Diagnosis not present

## 2021-03-09 DIAGNOSIS — O329XX0 Maternal care for malpresentation of fetus, unspecified, not applicable or unspecified: Secondary | ICD-10-CM

## 2021-03-09 DIAGNOSIS — Z3490 Encounter for supervision of normal pregnancy, unspecified, unspecified trimester: Secondary | ICD-10-CM

## 2021-03-09 LAB — TYPE AND SCREEN
ABO/RH(D): A POS
Antibody Screen: NEGATIVE

## 2021-03-09 LAB — CBC
HCT: 34.3 % — ABNORMAL LOW (ref 36.0–46.0)
HCT: 32.2 % — ABNORMAL LOW (ref 36.0–46.0)
Hemoglobin: 10.9 g/dL — ABNORMAL LOW (ref 12.0–15.0)
Hemoglobin: 10.1 g/dL — ABNORMAL LOW (ref 12.0–15.0)
MCH: 27 pg (ref 26.0–34.0)
MCH: 26.8 pg (ref 26.0–34.0)
MCHC: 31.8 g/dL (ref 30.0–36.0)
MCHC: 31.4 g/dL (ref 30.0–36.0)
MCV: 85.1 fL (ref 80.0–100.0)
MCV: 85.4 fL (ref 80.0–100.0)
Platelets: 131 10*3/uL — ABNORMAL LOW (ref 150–400)
Platelets: 125 10*3/uL — ABNORMAL LOW (ref 150–400)
RBC: 4.03 MIL/uL (ref 3.87–5.11)
RBC: 3.77 MIL/uL — ABNORMAL LOW (ref 3.87–5.11)
RDW: 13.5 % (ref 11.5–15.5)
RDW: 13.7 % (ref 11.5–15.5)
WBC: 5.4 10*3/uL (ref 4.0–10.5)
WBC: 6.9 10*3/uL (ref 4.0–10.5)
nRBC: 0 % (ref 0.0–0.2)
nRBC: 0 % (ref 0.0–0.2)

## 2021-03-09 LAB — RESP PANEL BY RT-PCR (FLU A&B, COVID) ARPGX2
Influenza A by PCR: NEGATIVE
Influenza B by PCR: NEGATIVE
SARS Coronavirus 2 by RT PCR: NEGATIVE

## 2021-03-09 LAB — POCT FERN TEST: POCT Fern Test: POSITIVE

## 2021-03-09 MED ORDER — EPHEDRINE 5 MG/ML INJ: 10.0000 mg | INTRAVENOUS | Status: DC | PRN

## 2021-03-09 MED ORDER — LEVONORGESTREL 20.1 MCG/DAY IU IUD
1.0000 | INTRAUTERINE_SYSTEM | Freq: Once | INTRAUTERINE | Status: AC
  Administered 2021-03-10: 1 via INTRAUTERINE
  Filled 2021-03-09: qty 1

## 2021-03-09 MED ORDER — LIDOCAINE HCL (PF) 1 % IJ SOLN: 30.0000 mL | INTRAMUSCULAR | Status: DC | PRN

## 2021-03-09 MED ORDER — OXYCODONE-ACETAMINOPHEN 5-325 MG PO TABS: 2.0000 | ORAL_TABLET | ORAL | Status: DC | PRN

## 2021-03-09 MED ORDER — OXYTOCIN-SODIUM CHLORIDE 30-0.9 UT/500ML-% IV SOLN
2.5000 [IU]/h | INTRAVENOUS | Status: DC
  Administered 2021-03-10: 2.5 [IU]/h via INTRAVENOUS

## 2021-03-09 MED ORDER — PHENYLEPHRINE 40 MCG/ML (10ML) SYRINGE FOR IV PUSH (FOR BLOOD PRESSURE SUPPORT): 80.0000 ug | PREFILLED_SYRINGE | INTRAVENOUS | Status: DC | PRN

## 2021-03-09 MED ORDER — OXYCODONE-ACETAMINOPHEN 5-325 MG PO TABS: 1.0000 | ORAL_TABLET | ORAL | Status: DC | PRN

## 2021-03-09 MED ORDER — TERBUTALINE SULFATE 1 MG/ML IJ SOLN: 0.2500 mg | Freq: Once | INTRAMUSCULAR | Status: DC | PRN

## 2021-03-09 MED ORDER — MISOPROSTOL 25 MCG QUARTER TABLET: 25.0000 ug | ORAL_TABLET | ORAL | Status: DC | PRN

## 2021-03-09 MED ORDER — LACTATED RINGERS IV SOLN: 500.0000 mL | INTRAVENOUS | Status: DC | PRN

## 2021-03-09 MED ORDER — LACTATED RINGERS IV SOLN: INTRAVENOUS | Status: DC

## 2021-03-09 MED ORDER — DIPHENHYDRAMINE HCL 50 MG/ML IJ SOLN: 12.5000 mg | INTRAMUSCULAR | Status: DC | PRN

## 2021-03-09 MED ORDER — LACTATED RINGERS IV SOLN
500.0000 mL | Freq: Once | INTRAVENOUS | Status: AC
Start: 2021-03-09 — End: 2021-03-09
  Administered 2021-03-09: 500 mL via INTRAVENOUS

## 2021-03-09 MED ORDER — FENTANYL CITRATE (PF) 100 MCG/2ML IJ SOLN
100.0000 ug | INTRAMUSCULAR | Status: DC | PRN
  Administered 2021-03-09 (×2): 100 ug via INTRAVENOUS
  Filled 2021-03-09 (×2): qty 2

## 2021-03-09 MED ORDER — FENTANYL-BUPIVACAINE-NACL 0.5-0.125-0.9 MG/250ML-% EP SOLN
12.0000 mL/h | EPIDURAL | Status: DC | PRN
  Administered 2021-03-10: 12 mL/h via EPIDURAL
  Filled 2021-03-09: qty 250

## 2021-03-09 MED ORDER — ACETAMINOPHEN 325 MG PO TABS: 650.0000 mg | ORAL_TABLET | ORAL | Status: DC | PRN

## 2021-03-09 MED ORDER — OXYTOCIN BOLUS FROM INFUSION
333.0000 mL | Freq: Once | INTRAVENOUS | Status: AC
Start: 2021-03-09 — End: 2021-03-10
  Administered 2021-03-10: 333 mL via INTRAVENOUS

## 2021-03-09 MED ORDER — OXYTOCIN-SODIUM CHLORIDE 30-0.9 UT/500ML-% IV SOLN
1.0000 m[IU]/min | INTRAVENOUS | Status: DC
  Administered 2021-03-09: 2 m[IU]/min via INTRAVENOUS
  Filled 2021-03-09: qty 500

## 2021-03-09 MED ORDER — ONDANSETRON HCL 4 MG/2ML IJ SOLN: 4.0000 mg | Freq: Four times a day (QID) | INTRAMUSCULAR | Status: DC | PRN

## 2021-03-09 MED ORDER — SOD CITRATE-CITRIC ACID 500-334 MG/5ML PO SOLN: 30.0000 mL | ORAL | Status: DC | PRN

## 2021-03-09 NOTE — H&P (Signed)
OBSTETRIC ADMISSION HISTORY AND PHYSICAL  Andrea Jenkins is a 25 y.o. female G72P2002 with IUP at [redacted]w[redacted]d by L/11 presenting for labor management s/p PROM at 1530 today. She reports +FMs, no VB, no blurry vision, headaches or peripheral edema, and RUQ pain.  She plans on breast and formula feeding. She requests post-placental Liletta IUD for birth control. She received her prenatal care at [facility]   Dating: By L/11 --->  Estimated Date of Delivery: 03/20/21  Sono:  @[redacted]w[redacted]d , CWD, normal anatomy, cephalic presentation, 2509g, EFW  Prenatal History/Complications:  - short interval pregnancy - alpha thal carrier - sickle cell trait - Anxiety  SI earlier in pregnancy (zoloft earlier in pregnancy)  Past Medical History: Past Medical History:  Diagnosis Date  . Anxiety   . Asthma    pt states she doesn't use inhaler much (04/10/19)  . Depression   . Gonorrhea   . Heart murmur    when younger  . Vaginal Pap smear, abnormal     Past Surgical History: Past Surgical History:  Procedure Laterality Date  . CATARACT EXTRACTION    . WISDOM TOOTH EXTRACTION    . WRIST SURGERY      Obstetrical History: OB History    Gravida  3   Para  2   Term  2   Preterm  0   AB  0   Living  2     SAB  0   IAB  0   Ectopic  0   Multiple  0   Live Births  2           Social History Social History   Socioeconomic History  . Marital status: Single    Spouse name: Not on file  . Number of children: Not on file  . Years of education: Not on file  . Highest education level: Not on file  Occupational History  . Occupation: [employer]   Tobacco Use  . Smoking status: Former Smoker    Packs/day: 0.25    Types: Cigars    Quit date: 03/11/2017    Years since quitting: 3.9  . Smokeless tobacco: Never Used  Vaping Use  . Vaping Use: Never used  Substance and Sexual Activity  . Alcohol use: No    Alcohol/week: 0.0 standard drinks    Comment: mimosa on saturday. 04/06/19  .  Drug use: Not Currently    Types: Marijuana  . Sexual activity: Yes    Partners: Male    Birth control/protection: None    Comment: Pregnant   Other Topics Concern  . Not on file  Social History Narrative  . Not on file   Social Determinants of Health   Financial Resource Strain: Not on file  Food Insecurity: Not on file  Transportation Needs: Not on file  Physical Activity: Not on file  Stress: Not on file  Social Connections: Not on file    Family History: Family History  Problem Relation Age of Onset  . Hypertension Mother   . Miscarriages / 06/06/19 Mother   . Hypertension Maternal Grandmother   . Asthma Maternal Grandmother   . Diabetes Maternal Grandmother   . Vision loss Maternal Grandmother   . Mental illness Brother   . Cancer Maternal Aunt   . Mental illness Sister   . Heart disease Neg Hx     Allergies: Allergies  Allergen Reactions  . Peach [Prunus Persica] Hives    Medications Prior to Admission  Medication Sig Dispense Refill  Last Dose  . sertraline (ZOLOFT) 100 MG tablet Take 1 tablet (100 mg total) by mouth daily. 30 tablet 5 Past Week at Unknown time  . Elastic Bandages & Supports (COMFORT FIT MATERNITY SUPP MED) MISC Maternity support belt.  Fit to patient. (Patient not taking: Reported on 02/25/2021) 1 each 0   . ferrous sulfate 325 (65 FE) MG tablet Take 1 tablet (325 mg total) by mouth 2 (two) times daily with a meal. (Patient not taking: Reported on 02/25/2021) 60 tablet 5   . omeprazole (PRILOSEC) 20 MG capsule Take 1 capsule (20 mg total) by mouth 2 (two) times daily before a meal. 60 capsule 0   . Prenat-Fe Poly-Methfol-FA-DHA (VITAFOL ULTRA) 29-0.6-0.4-200 MG CAPS Take 1 tablet by mouth daily. (Patient not taking: Reported on 02/25/2021) 30 capsule 12      Review of Systems   All systems reviewed and negative except as stated in HPI  Blood pressure 111/66, pulse (!) 109, temperature 98.3 F (36.8 C), temperature source Oral, resp. rate  16, height 5' 3.5" (1.613 m), weight 83.5 kg, last menstrual period 06/13/2020, SpO2 100 %, not currently breastfeeding. General appearance: alert, cooperative and appears stated age Lungs: normal WOB Heart: regular rate Abdomen: soft, non-tender Extremities:  no sign of DVT Presentation: cephalic Fetal monitoringBaseline: 135 bpm, Variability: Good {> 6 bpm), Accelerations: Reactive and Decelerations: Absent Uterine activity: irregular contractions    Prenatal labs: ABO, Rh: A/Positive/-- (11/10 1124) Antibody: Negative (11/10 1124) Rubella: 2.01 (11/10 1124) RPR: Non Reactive (03/01 0948)  HBsAg: Negative (11/10 1124)  HIV: Non Reactive (03/01 0948)  GBS: Negative/-- (05/04 1033)  2 hr Glucola wnl Genetic screening wnl Anatomy US wnl  Prenatal Transfer Tool  Maternal Diabetes: No Genetic Screening: Normal Maternal Ultrasounds/Referrals: Normal Fetal Ultrasounds or other Referrals:  None Maternal Substance Abuse:  No Significant Maternal Medications: zoloft earlier in pregnancy Significant Maternal Lab Results: Group B Strep negative  Results for orders placed or performed during the hospital encounter of 03/09/21 (from the past 24 hour(s))  Fern Test   Collection Time: 03/09/21  2:39 PM  Result Value Ref Range   POCT Fern Test Positive = ruptured amniotic membanes     Patient Active Problem List   Diagnosis Date Noted  . Term pregnancy 03/09/2021  . Supervision of high risk pregnancy, antepartum 08/26/2020  . Anxiety   . Alpha thalassemia silent carrier 10/24/2019  . Short interval between pregnancies affecting pregnancy, antepartum 05/30/2019  . LGSIL on Pap smear of cervix 10/20/2016    Assessment/Plan:  Andrea Jenkins is a 25 y.o. G3P2002 at [redacted]w[redacted]d here for labor management s/p PROM on 5/16 at 1330.  #Labor: Will manage expectantly and augment as clinically indicated. #Pain: TBD per pt request #FWB: Category 1 strip #ID: GBS negative #MOF: breast and  formula #MOC: plan for post-placental Liletta IUD. Pt consented on admission. #Circ: n/a #Anxiety  H/o SI earlier in current pregnancy: no mood or safety concerns on admission. S/p shared decision making, pt agrees to restart zoloft s/p delivery. Will plan for SW consult prior to discharge.  Sheila Oats, MD OB Fellow, Faculty Practice 03/09/2021 2:55 PM

## 2021-03-09 NOTE — Progress Notes (Signed)
Patient Vitals for the past 4 hrs:  BP Temp Temp src Pulse Resp  03/09/21 2201 (!) 96/54 97.8 F (36.6 C) Oral 84 16  03/09/21 2135 101/71 -- -- 70 --  03/09/21 2101 (!) 94/54 -- -- 78 18  03/09/21 2031 103/62 -- -- 69 --  03/09/21 2000 (!) 90/37 -- -- 76 15  03/09/21 1931 (!) 102/54 98.1 F (36.7 C) Oral 86 17  03/09/21 1902 110/66 -- -- (!) 128 --    FHR Cat 1.  Pitocin at 57mu/min, ctx q 2-4 minutes, mild. Continue present mgt

## 2021-03-09 NOTE — MAU Note (Signed)
Started leaking around 1330, pants are soaked. Denies bleeding. No real pain.  Mild irregular contractions. Was 3 cm when last checked.

## 2021-03-09 NOTE — MAU Provider Note (Signed)
Pt informed that the ultrasound is considered a limited OB ultrasound and is not intended to be a complete ultrasound exam.  Patient also informed that the ultrasound is not being completed with the intent of assessing for fetal or placental anomalies or any pelvic abnormalities.  Explained that the purpose of today's ultrasound is to assess for  presentation.  Patient acknowledges the purpose of the exam and the limitations of the study.    Baby Cephalic on presentation.  Levie Heritage, DO 03/09/2021 2:47 PM

## 2021-03-09 NOTE — Anesthesia Preprocedure Evaluation (Addendum)
Anesthesia Evaluation  Patient identified by MRN, date of birth, ID band Patient awake    Reviewed: Allergy & Precautions, H&P , NPO status , Patient's Chart, lab work & pertinent test results  History of Anesthesia Complications Negative for: history of anesthetic complications  Airway Mallampati: II  TM Distance: >3 FB Neck ROM: full    Dental no notable dental hx. (+) Teeth Intact   Pulmonary asthma , former smoker,    Pulmonary exam normal breath sounds clear to auscultation       Cardiovascular negative cardio ROS Normal cardiovascular exam Rhythm:regular Rate:Normal     Neuro/Psych PSYCHIATRIC DISORDERS Anxiety Depression negative neurological ROS     GI/Hepatic negative GI ROS, Neg liver ROS,   Endo/Other  negative endocrine ROS  Renal/GU negative Renal ROS  negative genitourinary   Musculoskeletal   Abdominal (+) + obese,   Peds  Hematology  (+) Blood dyscrasia, anemia ,   Anesthesia Other Findings   Reproductive/Obstetrics (+) Pregnancy                             Anesthesia Physical Anesthesia Plan  ASA: III  Anesthesia Plan: Epidural   Post-op Pain Management:    Induction:   PONV Risk Score and Plan:   Airway Management Planned:   Additional Equipment:   Intra-op Plan:   Post-operative Plan:   Informed Consent: I have reviewed the patients History and Physical, chart, labs and discussed the procedure including the risks, benefits and alternatives for the proposed anesthesia with the patient or authorized representative who has indicated his/her understanding and acceptance.       Plan Discussed with:   Anesthesia Plan Comments:         Anesthesia Quick Evaluation

## 2021-03-10 ENCOUNTER — Encounter (HOSPITAL_COMMUNITY): Payer: Self-pay | Admitting: Family Medicine

## 2021-03-10 DIAGNOSIS — D573 Sickle-cell trait: Secondary | ICD-10-CM

## 2021-03-10 DIAGNOSIS — Z3A38 38 weeks gestation of pregnancy: Secondary | ICD-10-CM

## 2021-03-10 DIAGNOSIS — F419 Anxiety disorder, unspecified: Secondary | ICD-10-CM

## 2021-03-10 DIAGNOSIS — Z30430 Encounter for insertion of intrauterine contraceptive device: Secondary | ICD-10-CM

## 2021-03-10 DIAGNOSIS — O9902 Anemia complicating childbirth: Secondary | ICD-10-CM

## 2021-03-10 DIAGNOSIS — O99344 Other mental disorders complicating childbirth: Secondary | ICD-10-CM

## 2021-03-10 LAB — RPR: RPR Ser Ql: NONREACTIVE

## 2021-03-10 MED ORDER — FLEET ENEMA 7-19 GM/118ML RE ENEM: 1.0000 | ENEMA | Freq: Every day | RECTAL | Status: DC | PRN

## 2021-03-10 MED ORDER — FERROUS SULFATE 325 (65 FE) MG PO TABS
325.0000 mg | ORAL_TABLET | ORAL | Status: DC
  Administered 2021-03-10 – 2021-03-12 (×2): 325 mg via ORAL
  Filled 2021-03-10 (×2): qty 1

## 2021-03-10 MED ORDER — METHYLERGONOVINE MALEATE 0.2 MG/ML IJ SOLN: 0.2000 mg | INTRAMUSCULAR | Status: DC | PRN

## 2021-03-10 MED ORDER — BENZOCAINE-MENTHOL 20-0.5 % EX AERO
1.0000 "application " | INHALATION_SPRAY | CUTANEOUS | Status: DC | PRN
  Administered 2021-03-10: 1 via TOPICAL
  Filled 2021-03-10: qty 56

## 2021-03-10 MED ORDER — LIDOCAINE HCL (PF) 1 % IJ SOLN
INTRAMUSCULAR | Status: DC | PRN
  Administered 2021-03-09 – 2021-03-10 (×2): 5 mL via EPIDURAL

## 2021-03-10 MED ORDER — ONDANSETRON HCL 4 MG/2ML IJ SOLN: 4.0000 mg | INTRAMUSCULAR | Status: DC | PRN

## 2021-03-10 MED ORDER — IBUPROFEN 600 MG PO TABS
600.0000 mg | ORAL_TABLET | Freq: Four times a day (QID) | ORAL | Status: DC
  Administered 2021-03-10 – 2021-03-12 (×8): 600 mg via ORAL
  Filled 2021-03-10 (×9): qty 1

## 2021-03-10 MED ORDER — PRENATAL MULTIVITAMIN CH
1.0000 | ORAL_TABLET | Freq: Every day | ORAL | Status: DC
  Administered 2021-03-10 – 2021-03-12 (×3): 1 via ORAL
  Filled 2021-03-10 (×3): qty 1

## 2021-03-10 MED ORDER — TETANUS-DIPHTH-ACELL PERTUSSIS 5-2.5-18.5 LF-MCG/0.5 IM SUSY: 0.5000 mL | PREFILLED_SYRINGE | Freq: Once | INTRAMUSCULAR | Status: DC

## 2021-03-10 MED ORDER — SIMETHICONE 80 MG PO CHEW: 80.0000 mg | CHEWABLE_TABLET | ORAL | Status: DC | PRN

## 2021-03-10 MED ORDER — DIPHENHYDRAMINE HCL 25 MG PO CAPS
25.0000 mg | ORAL_CAPSULE | Freq: Four times a day (QID) | ORAL | Status: DC | PRN
Start: 2021-03-10 — End: 2021-03-12

## 2021-03-10 MED ORDER — ONDANSETRON HCL 4 MG PO TABS: 4.0000 mg | ORAL_TABLET | ORAL | Status: DC | PRN

## 2021-03-10 MED ORDER — MEDROXYPROGESTERONE ACETATE 150 MG/ML IM SUSP: 150.0000 mg | INTRAMUSCULAR | Status: DC | PRN

## 2021-03-10 MED ORDER — MEASLES, MUMPS & RUBELLA VAC IJ SOLR: 0.5000 mL | Freq: Once | INTRAMUSCULAR | Status: DC

## 2021-03-10 MED ORDER — SERTRALINE HCL 25 MG PO TABS
25.0000 mg | ORAL_TABLET | Freq: Every day | ORAL | Status: DC
  Administered 2021-03-10 – 2021-03-11 (×2): 25 mg via ORAL
  Filled 2021-03-10 (×2): qty 1

## 2021-03-10 MED ORDER — DIBUCAINE (PERIANAL) 1 % EX OINT: 1.0000 "application " | TOPICAL_OINTMENT | CUTANEOUS | Status: DC | PRN

## 2021-03-10 MED ORDER — DOCUSATE SODIUM 100 MG PO CAPS
100.0000 mg | ORAL_CAPSULE | Freq: Two times a day (BID) | ORAL | Status: DC
Start: 2021-03-10 — End: 2021-03-12
  Administered 2021-03-10 – 2021-03-12 (×4): 100 mg via ORAL
  Filled 2021-03-10 (×4): qty 1

## 2021-03-10 MED ORDER — METHYLERGONOVINE MALEATE 0.2 MG PO TABS
0.2000 mg | ORAL_TABLET | ORAL | Status: DC | PRN
Start: 2021-03-10 — End: 2021-03-12

## 2021-03-10 MED ORDER — COCONUT OIL OIL: 1.0000 "application " | TOPICAL_OIL | Status: DC | PRN

## 2021-03-10 MED ORDER — WITCH HAZEL-GLYCERIN EX PADS: 1.0000 "application " | MEDICATED_PAD | CUTANEOUS | Status: DC | PRN

## 2021-03-10 MED ORDER — BISACODYL 10 MG RE SUPP: 10.0000 mg | Freq: Every day | RECTAL | Status: DC | PRN

## 2021-03-10 MED ORDER — ACETAMINOPHEN 325 MG PO TABS
650.0000 mg | ORAL_TABLET | ORAL | Status: DC | PRN
  Administered 2021-03-10 – 2021-03-11 (×4): 650 mg via ORAL
  Filled 2021-03-10 (×4): qty 2

## 2021-03-10 NOTE — Lactation Note (Signed)
This note was copied from a baby's chart. Lactation Consultation Note  Patient Name: Andrea Jenkins [id number] Date: 03/10/2021 Reason for consult: Initial assessment;Early term 37-38.6wks Age:24 hours   P3 mother whose infant is now 76 hours old.  This is an ETI at 38+4 weeks.  Mother breast fed her first child (now 32 years old) for 3 months and her second child (now 32 year old) for 1 month.  RN requested latch assistance.  Baby was swaddled and asleep in the bassinet when I arrived.  Mother stated that baby is not ready to feed.  I agreed and suggested mother call back when baby shows feeding cues.  Reviewed feeding cues and basic breast feeding guidelines.  Taught hand expression but mother was unable to express drops at this time.    Mother will call for assistance as needed.    Maternal Data Has patient been taught Hand Expression?: Yes Does the patient have breastfeeding experience prior to this delivery?: Yes How long did the patient breastfeed?: 3 months with her first child and 1 month with her second child  Feeding Mother's Current Feeding Choice: Breast Milk  LATCH Score                    Lactation Tools Discussed/Used    Interventions Interventions: Education  Discharge    Consult Status Consult Status: Follow-up Date: 03/10/21 Follow-up type: In-patient    Andrea Jenkins 03/10/2021, 6:52 AM

## 2021-03-10 NOTE — Plan of Care (Signed)
  Problem: Education: Goal: Knowledge of General Education information will improve Description: Including pain rating scale, medication(s)/side effects and non-pharmacologic comfort measures Outcome: Completed/Met

## 2021-03-10 NOTE — Progress Notes (Addendum)
OB/GYN Faculty Practice Delivery Note  Andrea Jenkins is a 25 y.o. [id number] s/p SVD at [redacted]w[redacted]d. She was admitted for PROM 5/16 @ 1330.   ROM: 11h 35m with clear fluid GBS Status: neg Maximum Maternal Temperature: 98.3  Delivery Date/Time: 05/17 @0034  Delivery: Called to room and patient was complete and pushing. Head delivered vaginally, spontaneous (presentation LOA). No nuchal cord present. Shoulder and body delivered in usual fashion. Infant slow to cry, placed on mother's abdomen, dried and vigorously stimulated. Cord clamped x 2 after 1-minute delay, cut by FOB under my direct supervision. Cord blood drawn. Placenta delivered spontaneously with gentle cord traction. Fundus firm with massage. Labia, perineum, vagina, and cervix were inspected, no lacerations seen.   Placenta: intact, three vessel cord with central insertion Complications: None Lacerations: None EBL: 150 mL Analgesia: Epidural  Postpartum Planning Mom to postpartum. Infant to couplet/skin to skin.   Infant: female  APGARs 8, 9    Delivery note reposted under "delivery summary"

## 2021-03-10 NOTE — Procedures (Signed)
  Post-Placental IUD Insertion Procedure Note  Patient identified, informed consent signed prior to delivery, signed copy in chart, time out was performed.    Vaginal, labial and perineal areas thoroughly inspected for lacerations. None found.  A liletta was grasped between sterile gloved fingers. Sterile lubrication applied to sterile gloved hand for ease of insertion. Fundus identified through abdominal wall using non-insertion hand. IUD inserted to fundus with bimanual technique. IUD carefully released at the fundus and insertion hand gently removed from vagina.     Patient given post procedure instructions and IUD care card with expiration date.  Patient is asked to keep IUD strings tucked in her vagina until her postpartum follow up visit in 4-6 weeks. Patient advised to abstain from sexual intercourse and pulling on strings before her follow-up visit. Patient verbalized an understanding of the plan of care and agrees.

## 2021-03-10 NOTE — Discharge Summary (Signed)
Postpartum Discharge Summary     Patient Name: Andrea Jenkins DOB: [birth year] MRN: [id number]  Date of admission: 03/09/2021 Delivery date:03/10/2021  Delivering provider: BLANKS, JESSICA L  Date of discharge: 03/12/2021  Admitting diagnosis: Term pregnancy [Z34.90] Intrauterine pregnancy: [redacted]w[redacted]d     Secondary diagnosis:  Active Problems:   Short interval between pregnancies affecting pregnancy, antepartum   Alpha thalassemia silent carrier   Anxiety   Term pregnancy  Additional problems: depression    Discharge diagnosis: Term Pregnancy Delivered                                              Post partum procedures:IUD Augmentation: Pitocin Complications: None  Hospital course: Onset of Labor With Vaginal Delivery      25 y.o. yo [id number] at [redacted]w[redacted]d was admitted in Latent Labor on 03/09/2021. Patient had an uncomplicated labor course as follows:  Membrane Rupture Time/Date: 1:30 PM ,03/09/2021   Delivery Method:Vaginal, Spontaneous  Episiotomy: None  Lacerations:  None  Patient had an uncomplicated postpartum course.  She is ambulating, tolerating a regular diet, passing flatus, and urinating well. She was restarted on Zoloft $RemoveB'50mg'JQtXHzlU$  while inpatient per her request, and she has a Ila visit scheduled for 03/24/21 to follow up. SW completed a visit with her as well (see note). Patient is discharged home in stable condition on 03/12/21.  Newborn Data: Birth date:03/10/2021  Birth time:12:34 AM  Gender:Female  Living status:Living  Apgars:8 ,9  Weight:3019 g (6lb 10.5oz)  Magnesium Sulfate received: No BMZ received: No Rhophylac:N/A MMR:N/A T-DaP:Given prenatally Flu: Yes Transfusion:No  Physical exam  Vitals:   03/11/21 0500 03/11/21 1700 03/11/21 2040 03/12/21 0330  BP: (!) 100/50 109/67 112/60 (!) 100/45  Pulse: 60 64 60 (!) 56  Resp: $Remo'17 17 17 16  'QidGC$ Temp: 98.1 F (36.7 C) 97.9 F (36.6 C) 97.8 F (36.6 C) 98.2 F (36.8 C)  TempSrc: Oral Oral Oral Oral  SpO2: 100%   100% 100%  Weight:      Height:       General: alert and cooperative Lochia: appropriate Uterine Fundus: firm Incision: N/A DVT Evaluation: No evidence of DVT seen on physical exam. Labs: Lab Results  Component Value Date   WBC 6.9 03/09/2021   HGB 10.1 (L) 03/09/2021   HCT 32.2 (L) 03/09/2021   MCV 85.4 03/09/2021   PLT 125 (L) 03/09/2021   CMP Latest Ref Rng & Units 08/29/2020  Glucose 70 - 99 mg/dL 106(H)  BUN 6 - 20 mg/dL 6  Creatinine 0.44 - 1.00 mg/dL 0.67  Sodium 135 - 145 mmol/L 135  Potassium 3.5 - 5.1 mmol/L 3.4(L)  Chloride 98 - 111 mmol/L 105  CO2 22 - 32 mmol/L 23  Calcium 8.9 - 10.3 mg/dL 9.0  Total Protein 6.5 - 8.1 g/dL 6.4(L)  Total Bilirubin 0.3 - 1.2 mg/dL 0.3  Alkaline Phos 38 - 126 U/L 30(L)  AST 15 - 41 U/L 17  ALT 0 - 44 U/L 11   Edinburgh Score: Edinburgh Postnatal Depression Scale Screening Tool 03/10/2021  I have been able to laugh and see the funny side of things. 1  I have looked forward with enjoyment to things. 1  I have blamed myself unnecessarily when things went wrong. 2  I have been anxious or worried for no good reason. 2  I have felt scared or panicky  for no good reason. 2  Things have been getting on top of me. 2  I have been so unhappy that I have had difficulty sleeping. 2  I have felt sad or miserable. 2  I have been so unhappy that I have been crying. 1  The thought of harming myself has occurred to me. 2  Edinburgh Postnatal Depression Scale Total 17     After visit meds:  Allergies as of 03/12/2021      Reactions   Peach [prunus Persica] Hives      Medication List    STOP taking these medications   Comfort Fit Maternity Supp Med Misc   ferrous sulfate 325 (65 FE) MG tablet   omeprazole 20 MG capsule Commonly known as: PriLOSEC     TAKE these medications   ibuprofen 600 MG tablet Commonly known as: ADVIL Take 1 tablet (600 mg total) by mouth every 6 (six) hours as needed.   sertraline 50 MG tablet Commonly  known as: ZOLOFT Take 1 tablet (50 mg total) by mouth daily. What changed:   medication strength  how much to take   Vitafol Ultra 29-0.6-0.4-200 MG Caps Take 1 tablet by mouth daily.        Discharge home in stable condition Infant Feeding: Breast Infant Disposition:home with mother Discharge instruction: per After Visit Summary and Postpartum booklet. Activity: Advance as tolerated. Pelvic rest for 6 weeks.  Diet: routine diet Future Appointments: Future Appointments  Date Time Provider [place]  03/24/2021  1:00 PM Lynnea Ferrier, LCSW [facility] None  04/08/2021 10:15 AM Nugent, Gerrie Nordmann, NP [facility] None   Follow up Visit:   Please schedule this patient for a In person postpartum visit in 4 weeks with the following provider: Any provider. Additional Postpartum F/U:Postpartum Depression checkup  Low risk pregnancy complicated by:  Delivery mode:  Vaginal, Spontaneous  Anticipated Birth Control:  PP IUD placed   03/12/2021 Myrtis Ser, CNM  7:16 AM

## 2021-03-10 NOTE — Anesthesia Procedure Notes (Signed)
Epidural Patient location during procedure: OB Start time: 03/09/2021 11:49 PM End time: 03/09/2021 11:59 PM  Staffing Anesthesiologist: Leonides Grills, MD Performed: anesthesiologist   Preanesthetic Checklist Completed: patient identified, IV checked, site marked, risks and benefits discussed, monitors and equipment checked, pre-op evaluation and timeout performed  Epidural Patient position: sitting Prep: DuraPrep Patient monitoring: heart rate, cardiac monitor, continuous pulse ox and blood pressure Approach: midline Location: L4-L5 Injection technique: LOR air  Needle:  Needle type: Tuohy  Needle gauge: 17 G Needle length: 9 cm Needle insertion depth: 7 cm Catheter type: closed end flexible Catheter size: 19 Gauge Catheter at skin depth: 12 cm Test dose: negative and Other  Assessment Events: blood not aspirated, injection not painful, no injection resistance and negative IV test  Additional Notes Informed consent obtained prior to proceeding including risk of failure, 1% risk of PDPH, risk of minor discomfort and bruising. Discussed alternatives to epidural analgesia and patient desires to proceed.  Timeout performed pre-procedure verifying patient name, procedure, and platelet count.  Patient tolerated procedure well. Reason for block:procedure for pain

## 2021-03-10 NOTE — Lactation Note (Signed)
This note was copied from a baby's chart. Lactation Consultation Note  Patient Name: Andrea Jenkins Date: 03/10/2021 Reason for consult: Follow-up assessment;Mother's request;Difficult latch;Early term 37-38.6wks Age:25 years   LC checked in to see how breastfeeding was going. Mom stated not able to get infant to latch on the left side.  Mom offering bottles as supplementation. LC talked to Mom about using her manual pump set up by RN, Verlan Friends, to pump q 3 hrs for 10 minutes each breast to maintain her milk supply.   Mom to call for assistance with next feeding.  Plan 1. To feed based on cues 8-12x in 24 hr period no more than 3 hrs without an attempt. Mom to offer both breasts and look for signs of milk transfer.           2. Mom to supplement based on breastfeeding supplementation guideline and hrs of age since delivery. Mom to offer EBM first and in separate bottle formula via paced bottle feeding with slow flow nipple.         3. Mom to pump with manual as stated above.   All questions answered at the end of the visit.  Mom to call for assistance with the next latch.   Maternal Data    Feeding Mother's Current Feeding Choice: Breast Milk and Formula  LATCH Score                    Lactation Tools Discussed/Used Tools: Pump;Flanges Flange Size: 24 Breast pump type: Manual (set up by RN) Reason for Pumping: increase stimulation Pumping frequency: every 3 hrs for 10 minutes each breasts.  Interventions Interventions: Breast feeding basics reviewed;Education;Hand pump  Discharge Pump: Manual WIC Program: Yes  Consult Status Consult Status: Follow-up Date: 03/11/21 Follow-up type: In-patient    David Towson  N[facility]olson-Springer 03/10/2021, 6:09 PM

## 2021-03-10 NOTE — Anesthesia Postprocedure Evaluation (Signed)
Anesthesia Post Note  Patient: Kathryne Hitch Basso  Procedure(s) Performed: AN AD HOC LABOR EPIDURAL     Patient location during evaluation: Mother Baby Anesthesia Type: Epidural Level of consciousness: awake and alert Pain management: pain level controlled Vital Signs Assessment: post-procedure vital signs reviewed and stable Respiratory status: spontaneous breathing, nonlabored ventilation and respiratory function stable Cardiovascular status: stable Postop Assessment: no headache, no backache and epidural receding Anesthetic complications: no Comments: Patient stated epidural did not work. Anesthesiologist was not notified by RN patient uncomfortable.     No complications documented.  Last Vitals:  Vitals:   03/10/21 0325 03/10/21 0738  BP: (!) 112/57 (!) 107/57  Pulse: 66 (!) 59  Resp: 16 17  Temp: 36.9 C 36.7 C  SpO2: 100% 99%    Last Pain:  Vitals:   03/10/21 0738  TempSrc: Oral  PainSc: 0-No pain   Pain Goal:                   [employer]

## 2021-03-11 ENCOUNTER — Other Ambulatory Visit (HOSPITAL_COMMUNITY): Payer: Medicaid Other | Attending: Family Medicine

## 2021-03-11 MED ORDER — SERTRALINE HCL 50 MG PO TABS
50.0000 mg | ORAL_TABLET | Freq: Every day | ORAL | Status: DC
  Administered 2021-03-12: 50 mg via ORAL
  Filled 2021-03-11: qty 1

## 2021-03-11 MED ORDER — OXYCODONE HCL 5 MG PO TABS
5.0000 mg | ORAL_TABLET | Freq: Once | ORAL | Status: AC
Start: 2021-03-11 — End: 2021-03-11
  Administered 2021-03-11: 5 mg via ORAL
  Filled 2021-03-11: qty 1

## 2021-03-11 NOTE — Progress Notes (Addendum)
POSTPARTUM PROGRESS NOTE  Post Partum Day 1  Subjective:  Andrea Jenkins is a 25 y.o. [id number] s/p SVD at [redacted]w[redacted]d.  She reports she is doing good, although has not been sleeping well. No acute events overnight. She denies any problems with ambulating, voiding or po intake. Denies nausea or vomiting. Pain is well controlled. Lochia is appropriate.  Objective: Blood pressure (!) 100/50, pulse 60, temperature 98.1 F (36.7 C), temperature source Oral, resp. rate 17, height 5' 3.5" (1.613 m), weight 83.5 kg, last menstrual period 06/13/2020, SpO2 100 %, unknown if currently breastfeeding.  Physical Exam:  General: sleepy, no distress Chest: normal work of breathing  Heart: regular rate, distal pulses intact Abdomen: soft, nontender Uterine Fundus: firm, appropriately tender DVT Evaluation: No calf swelling or tenderness Skin: warm, dry  Recent Labs    03/09/21 1443 03/09/21 2111  HGB 10.9* 10.1*  HCT 34.3* 32.2*    Assessment/Plan: Andrea Jenkins is a 25 y.o. [id number] s/p SVD at [redacted]w[redacted]d   #PPD#1 - Doing well  Routine postpartum care #Anxiety/Depression: SW to see. Increase zoloft to 50mg  daily. No safety concerns today. #Contraception: IUD placed PP #Feeding: breast and formula #Dispo: Plan for discharge tomorrow, 5/19.   LOS: 2 days   6/19, Medical Student  Attestation of Supervision of Student:  I confirm that I have verified the information documented in the student's note and that I have also personally reperformed the history, physical exam and all medical decision making activities.  I have verified that all services and findings are accurately documented in this student's note; and I agree with management and plan as outlined in the documentation. I have also made any necessary editorial changes.  Dayna Barker, MD Center for [facility], [facility] 03/11/2021 8:56 PM

## 2021-03-11 NOTE — Social Work (Addendum)
CSW received consult for hx of Anxiety, Depression, SI this pregnancy and Edinburgh of 17.  CSW met with MOB to offer support and complete assessment.     CSW introduced self and role. CSW observed FOB sleeping on couch and infant in bassinet. MOB provided permission to complete assessment with FOB present. MOB was pleasant, engaged and open with CSW. CSW informed MOB of reason for consult and assessed current emotions. MOB reported she is currently doing well and had a good pregnancy. MOB smiled as she stated the pregnancy was long and she started to become depressed. CSW inquired on MOB Edinburgh and answer of 'sometimes' in reference to thoughts of harming herself. MOB stated she answered the question based on if she has ever had those thoughts and not based on the last 7 days. MOB reported she has not had suicidal ideations since April. CSW asked MOB if she had a plan in mind and how she coped with the ideations. MOB stated she only had passive thoughts and never had a plan in mind. MOB reported she coped with the thoughts by talking to her sisters and taking her prescribed Zoloft. MOB shared the Zoloft has been helpful. MOB disclosed she was first diagnosed with anxiety and depression in 2021, following the birth of her son. MOB shared she experienced postpartum depression about a month after his birth, which presented as isolation and lack of appetite. MOB stated the symptoms only lasted about a month. MOB reported she experienced PPD with both of her previous children, however it was worse with her son. CSW asked MOB if she has ever been to therapy to cope with symptoms. MOB reported she attended therapy at the [facility] office during the pregnancy and would be open to therapy postpartum. MOB was receptive to mental health resources provided by CSW. MOB stated FOB, her sister and mother are good supports. MOB denies any current SI or HI.   CSW provided education regarding the baby blues period versus PPD. CSW  provided the New Mom Checklist and encouraged MOB to self evaluate and contact a medical professional if symptoms are noted at any time.  CSW provided review of Sudden Infant Death Syndrome (SIDS) precautions. MOB reported she has all essentials for infant, including a car seat and bassinet. MOB identified [facility] for follow-up care and denies any barriers to care. MOB open to a referral for [facility] and declined any additional community referrals. MOB expressed no additional needs at this time.   CSW identifies no further need for intervention and no barriers to discharge at this time.  Darra Lis, [place] [employer] [phone]

## 2021-03-12 MED ORDER — SERTRALINE HCL 50 MG PO TABS: 50.0000 mg | ORAL_TABLET | Freq: Every day | ORAL | 3 refills | Status: DC

## 2021-03-12 MED ORDER — IBUPROFEN 600 MG PO TABS: 600.0000 mg | ORAL_TABLET | Freq: Four times a day (QID) | ORAL | 0 refills | Status: DC | PRN

## 2021-03-12 NOTE — Lactation Note (Signed)
This note was copied from a baby's chart. Lactation Consultation Note  Patient Name: Girl Darely Becknell ONGEX'B Date: 03/12/2021 Reason for consult: Follow-up assessment;Early term 37-38.6wks;Other (Comment) (-5% weight loss) Age:24 hours P3. ETI female infant. Per mom, she feels breastfeeding is going well, infant is breastfeeding 20 minutes most feeding. LC entered the room, mom had infant latched on her right breast using the football, LC did not assist with latch, infant BF after 10 minutes when LC left the room. Per mom, infant is currently cluster feeding, mom will continue to BF infant according to cues, 8 to 12+ or more times, STS. Mom knows to hand express after latching infant at the breast and  give back colostrum by spoon.  LC discussed with mom, engorgement prevention and treatment.   Maternal Data    Feeding Mother's Current Feeding Choice: Breast Milk  LATCH Score Latch: Grasps breast easily, tongue down, lips flanged, rhythmical sucking.  Audible Swallowing: A few with stimulation  Type of Nipple: Everted at rest and after stimulation  Comfort (Breast/Nipple): Soft / non-tender  Hold (Positioning): No assistance needed to correctly position infant at breast.  LATCH Score: 9   Lactation Tools Discussed/Used    Interventions Interventions: Skin to skin;Expressed milk;Education  Discharge    Consult Status Consult Status: Follow-up Date: 03/12/21 Follow-up type: In-patient    Danelle Earthly 03/12/2021, 1:53 AM

## 2021-03-12 NOTE — Discharge Instructions (Signed)

## 2021-03-13 ENCOUNTER — Inpatient Hospital Stay (HOSPITAL_COMMUNITY)
Admission: AD | Admit: 2021-03-13 | Payer: Medicaid Other | Source: Home / Self Care | Admitting: Obstetrics & Gynecology

## 2021-03-13 ENCOUNTER — Telehealth: Payer: Self-pay

## 2021-03-13 ENCOUNTER — Inpatient Hospital Stay (HOSPITAL_COMMUNITY): Payer: Medicaid Other

## 2021-03-13 NOTE — Telephone Encounter (Signed)
Transition Care Management Follow-up Telephone Call  Date of discharge and from where: 03/12/2021 from [facility]  How have you been since you were released from the hospital? Pt stated that she is feeling well and has not questions or concerns at this time.   Any questions or concerns? No  Items Reviewed:  Did the pt receive and understand the discharge instructions provided? Yes   Medications obtained and verified? Yes   Other? No   Any new allergies since your discharge? No   Dietary orders reviewed? n/a  Do you have support at home? Yes   Functional Questionnaire: (I = Independent and D = Dependent) ADLs: I  Bathing/Dressing- I  Meal Prep- I  Eating- I  Maintaining continence- I  Transferring/Ambulation- I  Managing Meds- I   Follow up appointments reviewed:   PCP Hospital f/u appt confirmed? No    Specialist Hospital f/u appt confirmed? Yes  Scheduled to see Donia Ast, NP on 04/08/2021 @ 10:15am.  Are transportation arrangements needed? No   If their condition worsens, is the pt aware to call PCP or go to the Emergency Dept.? Yes  Was the patient provided with contact information for the PCP's office or ED? Yes  Was to pt encouraged to call back with questions or concerns? Yes

## 2021-03-24 ENCOUNTER — Encounter: Payer: Medicaid Other | Admitting: Licensed Clinical Social Worker

## 2021-04-06 ENCOUNTER — Encounter: Payer: Medicaid Other | Admitting: Licensed Clinical Social Worker

## 2021-04-08 ENCOUNTER — Ambulatory Visit (INDEPENDENT_AMBULATORY_CARE_PROVIDER_SITE_OTHER): Payer: Medicaid Other | Admitting: Women's Health

## 2021-04-08 ENCOUNTER — Other Ambulatory Visit (HOSPITAL_COMMUNITY)
Admission: RE | Admit: 2021-04-08 | Discharge: 2021-04-08 | Disposition: A | Discharge status: Home / Self Care | Payer: Medicaid Other | Source: Ambulatory Visit | Attending: Women's Health | Admitting: Women's Health

## 2021-04-08 ENCOUNTER — Other Ambulatory Visit: Payer: Self-pay

## 2021-04-08 ENCOUNTER — Encounter: Payer: Self-pay | Admitting: Women's Health

## 2021-04-08 NOTE — Progress Notes (Signed)
..   Post Partum Visit Note  Andrea Jenkins is a 25 y.o. G42P3003 female who presents for a postpartum visit. She is 4 weeks postpartum following a normal spontaneous vaginal delivery.  I have fully reviewed the prenatal and intrapartum course. The delivery was at 38.4 gestational weeks.  Anesthesia: epidural. Postpartum course has been good. Baby is doing well. Baby is feeding by both breast and bottle - Similac Advance. Bleeding staining only. Bowel function is abnormal: related to hemorrhoids . Bladder function is normal. Patient is not sexually active. Contraception method is IUD. Postpartum depression screening: negative.   The pregnancy intention screening data noted above was reviewed. Potential methods of contraception were discussed. The patient elected to proceed with IUD or IUS.    Edinburgh Postnatal Depression Scale - 04/08/21 1019       Edinburgh Postnatal Depression Scale:  In the Past 7 Days   I have been able to laugh and see the funny side of things. 0    I have looked forward with enjoyment to things. 0    I have blamed myself unnecessarily when things went wrong. 0    I have been anxious or worried for no good reason. 0    I have felt scared or panicky for no good reason. 0    Things have been getting on top of me. 0    I have been so unhappy that I have had difficulty sleeping. 0    I have felt sad or miserable. 0    I have been so unhappy that I have been crying. 0    The thought of harming myself has occurred to me. 0    Edinburgh Postnatal Depression Scale Total 0             Health Maintenance Due  Topic Date Due   COVID-19 Vaccine (1) Never done   Pneumococcal Vaccine 59-8 Years old (1 - PCV) Never done   HPV VACCINES (1 - 2-dose series) Never done   Zoster Vaccines- Shingrix (1 of 2) Never done   PAP-Cervical Cytology Screening  02/15/2021   PAP SMEAR-Modifier  02/15/2021    The following portions of the patient's history were reviewed and  updated as appropriate: allergies, current medications, past family history, past medical history, past social history, past surgical history, and problem list.  Review of Systems Pertinent items noted in HPI and remainder of comprehensive ROS otherwise negative.  Objective:  BP 115/75   Pulse 70   Wt 178 lb (80.7 kg)   LMP 06/13/2020   Breastfeeding Yes   BMI 31.04 kg/m    General:  alert, cooperative, and no distress   Breasts:  not indicated  Lungs: clear to auscultation bilaterally  Heart:  regular rate and rhythm, S1, S2 normal, no murmur, click, rub or gallop  Abdomen: soft, non-tender; bowel sounds normal; no masses,  no organomegaly   Wound N/A  GU exam:  normal, IUD strings visualized and trimmed to 2-3cm, pt advised if strings lengthen to return to office for evaluation, body of IUD not visible or palpated on exam.       Assessment:   1. Postpartum exam - Cytology - PAP( [place])  Normal postpartum exam, pt reports ongoing hemorrhoids, advised to see PCP for all primary care concerns.   Plan:   Essential components of care per ACOG recommendations:  1.  Mood and well being: Patient with negative depression screening today. Reviewed local resources for support.  -  Patient tobacco use? No.   - hx of drug use? No.    2. Infant care and feeding:  -Patient currently breastmilk feeding? Yes. Discussed returning to work and pumping. Reviewed importance of draining breast regularly to support lactation. Patient needs a work note. Patient was provided letter for work to allow for every 2-3 hr pumping breaks, and to be granted a private location to express breastmilk and refrigerated area to store breastmilk.  -Social determinants of health (SDOH) reviewed in EPIC.  3. Sexuality, contraception and birth spacing - Patient does not want a pregnancy in the next year.  Desired family size is 3 children.  - Reviewed forms of contraception in tiered fashion. Patient desired  IUD today.   - Discussed birth spacing of 18 months  4. Sleep and fatigue -Encouraged family/partner/community support of 4 hrs of uninterrupted sleep to help with mood and fatigue  5. Physical Recovery  - Discussed patients delivery and complications. She describes her labor as mixed. Pt reports her epidural did not work and pushing was painful and she did not experience that with her other children because of her working epidural for those births. - Patient had a Vaginal, no problems at delivery. Patient had a  no  laceration. Perineal healing reviewed. Patient expressed understanding - Patient has urinary incontinence? No. - Patient is safe to resume physical and sexual activity  6.  Health Maintenance - HM due items addressed Yes - Last pap smear  Diagnosis  Date Value Ref Range Status  02/15/2018   Final   NEGATIVE FOR INTRAEPITHELIAL LESIONS OR MALIGNANCY.   Pap smear done at today's visit.  -Breast Cancer screening indicated? No.   7. Chronic Disease/Pregnancy Condition follow up:  hemorrhoids - PCP list given - PCP follow up  Marylen Ponto, NP Center for [employer], [facility]

## 2021-04-08 NOTE — Patient Instructions (Signed)
AREA FAMILY PRACTICE PHYSICIANS  [place] ([address]) [facility] [address] [place], [place] [address] [phone] Mon-Fri 8:30-12:30, 1:30-5:00 Accepting Medicaid [facility] [address], [place], [place] [address] [phone] Mon-Fri 8:00-5:30 [facility] [address] [place], [place] [address] [phone] Mon, Tue, Thur, Fri 8:30-5:00, Wed 10:00-7:00 (closed 1-2pm) Accepting Medicaid [facility] [address], [place], [place]  [address] Phone - [phone]   Fax - [phone]  [place] ([address]) [facility] [address] [place], [place] [address] [phone] Mon-Fri 8:00-5:00 [facility] ([facility])  [address] [place], [place] [address] [phone] Mon-Fri 8:30-5:30, Sat (Oct.-Mar.) 9:00-1:00 Accepting Medicaid  [place] ([address]) [facility] [address], [place], [place] [address] [phone] Mon-Fri 8:00-5:00  [place] ([address]) [facility] [address], [place], [place] [address] [phone] Mon-Fri 8:00-5:00 [facility] [address], [place], [place] [address] [phone] Mon-Fri 8:00-5:00 [facility] [address] [place], [place] [address] [phone] Mon-Fri 8:00-5:00 [facility] [address] [place] [address] [phone] Mon-Fri 7:30-5:30  [place] ([address] & [address]) [facility] [address] [place], [place] [address] [phone] Mon-Thur 8:00-6:00 Accepting Medicaid [facility] [address] [place], [place] [address] [phone] Mon-Thur 7:30-7:30, Fri 7:30-4:30 Accepting  Medicaid [facility] [address], [place], [place]  [address] [phone]   Fax - [phone]  [place] ([address] & [address]) [facility] [address] [place], [place] [address] [phone] Mon-Fri 7:00-5:00 [facility] [address], [place], [place] [address] [phone] Mon-Fri 8:00-5:00 Accepting Medicaid [facility] [address], , [place] [address] [phone] Mon-Fri 8:00-5:00 Accepting Medicaid  [place] ([address]) [facility] [place] [address] [place], [place] [address] [phone] Mon-Fri 8:00-5:00 [facility] [address], [place], [place] [address] [phone] Mon-Fri 8:00-5:00 Accepting Medicaid [facility] ([facility]) [address], [place], [place] [address] [phone] Mon-Fri 8:00-5:30, Sat&Sun by appointment (phones open at 8:30) Accepting Medicaid  [place] ([address]) [facility] [address] [place], [place] [address] [phone] Mon-Thur 8:00-7:00, Fri 8:00-5:00, Sat 8:00-12:00, Sun 9:00-12:00 Accepting Medicaid [facility] [place] [address], [place], [place] [address] [phone] Mon-Thur 8:00-5:00 Accepting Medicaid [facility] [address] [place], [place] [address] [phone] Mon-Fri 8:00-5:30, Sat (Oct.-Mar.) 9:00-1:00 Accepting Medicaid  [place] ([address]) [facility] [address], [place], [place] [address] [phone] Mon-Fri 8:00-5:00 Accepting Medicaid   [place] ([address]) [facility] [place] [address], [place], [place] [address] [phone] Mon-Fri 8:00-5:00 [facility] [place] [address], [place], [place] [address] [phone] Mon-Fri 8:00-5:00 [facility] [place] [address] [place] [address], [place], [place] [address] [phone] Mon-Fri 8:00-5:00 After hours clinic ([address] ,  [address]) [phone] Mon-Fri 5:00-8:00, Sat 12:00-6:00, Sun 10:00-4:00 Accepting Medicaid [facility] [place]   [address], [place], [place]  [address] [phone]   Fax - [phone]  [place] ([address]) [facility] [place] [facility] [address], [place], [place] [address] [phone] Mon-Fri 8:00-5:00 [facility] [place] ([facility] [place]) [address], [place], [place] [address] [phone] Mon-Thur 8:00-7:00, Fri 8:00-5:00, Sat 8:00-12:00    

## 2021-04-09 LAB — CYTOLOGY - PAP: Diagnosis: NEGATIVE

## 2021-06-30 ENCOUNTER — Other Ambulatory Visit: Payer: Self-pay | Admitting: *Deleted

## 2021-06-30 MED ORDER — METRONIDAZOLE 500 MG PO TABS: 500.0000 mg | ORAL_TABLET | Freq: Two times a day (BID) | ORAL | 0 refills | Status: DC

## 2021-06-30 NOTE — Progress Notes (Signed)
Pt called to office with BV symptoms.  Flagyl sent today per protocol.

## 2021-07-03 ENCOUNTER — Encounter (HOSPITAL_BASED_OUTPATIENT_CLINIC_OR_DEPARTMENT_OTHER): Payer: Self-pay | Admitting: *Deleted

## 2021-07-03 ENCOUNTER — Emergency Department (HOSPITAL_BASED_OUTPATIENT_CLINIC_OR_DEPARTMENT_OTHER)
Admission: EM | Admit: 2021-07-03 | Discharge: 2021-07-03 | Disposition: A | Discharge status: Home / Self Care | Payer: Medicaid Other | Attending: Emergency Medicine | Admitting: Emergency Medicine

## 2021-07-03 ENCOUNTER — Other Ambulatory Visit: Payer: Self-pay

## 2021-07-03 DIAGNOSIS — J45909 Unspecified asthma, uncomplicated: Secondary | ICD-10-CM | POA: Insufficient documentation

## 2021-07-03 DIAGNOSIS — N3001 Acute cystitis with hematuria: Secondary | ICD-10-CM | POA: Diagnosis not present

## 2021-07-03 DIAGNOSIS — N760 Acute vaginitis: Secondary | ICD-10-CM | POA: Diagnosis not present

## 2021-07-03 DIAGNOSIS — Z87891 Personal history of nicotine dependence: Secondary | ICD-10-CM | POA: Diagnosis not present

## 2021-07-03 DIAGNOSIS — N898 Other specified noninflammatory disorders of vagina: Secondary | ICD-10-CM | POA: Diagnosis present

## 2021-07-03 DIAGNOSIS — B9689 Other specified bacterial agents as the cause of diseases classified elsewhere: Secondary | ICD-10-CM | POA: Insufficient documentation

## 2021-07-03 LAB — URINALYSIS, ROUTINE W REFLEX MICROSCOPIC
Bilirubin Urine: NEGATIVE
Glucose, UA: NEGATIVE mg/dL
Ketones, ur: NEGATIVE mg/dL
Nitrite: NEGATIVE
Protein, ur: 30 mg/dL — AB
Specific Gravity, Urine: 1.024 (ref 1.005–1.030)
pH: 6.5 (ref 5.0–8.0)

## 2021-07-03 LAB — WET PREP, GENITAL
Clue Cells Wet Prep HPF POC: NONE SEEN
Sperm: NONE SEEN
Trich, Wet Prep: NONE SEEN
Yeast Wet Prep HPF POC: NONE SEEN

## 2021-07-03 MED ORDER — NITROFURANTOIN MONOHYD MACRO 100 MG PO CAPS: 100.0000 mg | ORAL_CAPSULE | Freq: Two times a day (BID) | ORAL | 0 refills | Status: AC

## 2021-07-03 MED ORDER — TRIAMCINOLONE ACETONIDE 0.5 % EX OINT: 1.0000 "application " | TOPICAL_OINTMENT | Freq: Two times a day (BID) | CUTANEOUS | 0 refills | Status: DC

## 2021-07-03 NOTE — ED Triage Notes (Signed)
Vaginal itching and rash started on the 1st, Thursday.  She also stated that she has some vaginal discharges but got resolved after using monistat OTC.

## 2021-07-03 NOTE — ED Provider Notes (Signed)
[facility]PT Provider Note   CSN: [id number] Arrival date & time: 07/03/21  8469     History Chief Complaint  Patient presents with   Vaginal rash    Andrea Jenkins is a 25 y.o. female.  Initially occurred in conjunction with her menstrual period. She realized she was using scented pads, and she is sensitive to products like that. Sexually active with same female partner for years.  The history is provided by the patient.  Female GU Problem This is a new problem. Episode onset: about a week ago. The problem occurs constantly. The problem has not changed since onset.Pertinent negatives include no chest pain, no abdominal pain, no headaches and no shortness of breath. Nothing aggravates the symptoms. Nothing relieves the symptoms. Treatments tried: treatments for BV and yeast. The treatment provided no relief.      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    Past Surgical History:  Procedure Laterality Date   CATARACT EXTRACTION     WISDOM TOOTH EXTRACTION     WRIST SURGERY       OB History     Gravida  3   Para  3   Term  3   Preterm  0   AB  0   Living  3      SAB  0   IAB  0   Ectopic  0   Multiple  0   Live Births  3           Family History  Problem Relation Age of Onset   Hypertension Mother    Miscarriages / [place] Mother    Hypertension Maternal Grandmother    Asthma Maternal Grandmother    Diabetes Maternal Grandmother    Vision loss Maternal Grandmother    Mental illness Brother    Cancer Maternal Aunt    Mental illness Sister    Heart disease Neg Hx     Social History    Tobacco Use   Smoking status: Former    Packs/day: 0.25    Types: Cigars, Cigarettes    Quit date: 03/11/2017    Years since quitting: 4.3   Smokeless tobacco: Never  Vaping Use   Vaping Use: Never used  Substance Use Topics   Alcohol use: Yes    Comment: occ   Drug use: Not Currently    Types: Marijuana    Comment: occasioinally    Home Medications Prior to Admission medications   Medication Sig Start Date End Date Taking? Authorizing Provider  sertraline (ZOLOFT) 50 MG tablet Take 1 tablet (50 mg total) by mouth daily. 03/12/21  Yes Cam Hai D, CNM  ibuprofen (ADVIL) 600 MG tablet Take 1 tablet (600 mg total) by mouth every 6 (six) hours as needed. 03/12/21   Arabella Merles, CNM  metroNIDAZOLE (FLAGYL) 500 MG tablet Take 1 tablet (500 mg total) by mouth 2 (two) times daily. 06/30/21   Brock Bad, MD    Allergies    Peach [prunus persica]  Review of Systems   Review of  Systems  Constitutional:  Negative for chills and fever.  HENT:  Negative for ear pain and sore throat.   Eyes:  Negative for pain and visual disturbance.  Respiratory:  Negative for cough and shortness of breath.   Cardiovascular:  Negative for chest pain and palpitations.  Gastrointestinal:  Negative for abdominal pain and vomiting.  Genitourinary:  Positive for dysuria (just for one day) and vaginal discharge (white, like yeast prior to self treatment for same). Negative for hematuria.  Musculoskeletal:  Negative for arthralgias and back pain.  Skin:  Negative for color change and rash.  Neurological:  Negative for seizures, syncope and headaches.  All other systems reviewed and are negative.  Physical Exam Updated Vital Signs BP (!) 115/93 (BP Location: Right Arm)   Pulse 74   Temp 98.6 F (37 C) (Oral)   Resp 16   Ht 5\' 3"  (1.6 m)   Wt 79.8 kg   LMP 07/02/2021   SpO2 100%   BMI 31.18 kg/m   Physical Exam Vitals and nursing note reviewed. Exam conducted with a chaperone  present.  HENT:     Head: Normocephalic and atraumatic.  Eyes:     General: No scleral icterus. Pulmonary:     Effort: Pulmonary effort is normal. No respiratory distress.  Genitourinary:    Vagina: Vaginal discharge present.     Cervix: No cervical motion tenderness.     Comments: Yellow discharge Musculoskeletal:     Cervical back: Normal range of motion.  Skin:    General: Skin is warm and dry.  Neurological:     General: No focal deficit present.     Mental Status: She is alert and oriented to person, place, and time.  Psychiatric:        Mood and Affect: Mood normal.    ED Results / Procedures / Treatments   Labs (all labs ordered are listed, but only abnormal results are displayed) Labs Reviewed  WET PREP, GENITAL - Abnormal; Notable for the following components:      Result Value   WBC, Wet Prep HPF POC MANY (*)    All other components within normal limits  URINALYSIS, ROUTINE W REFLEX MICROSCOPIC - Abnormal; Notable for the following components:   Hgb urine dipstick SMALL (*)    Protein, ur 30 (*)    Leukocytes,Ua LARGE (*)    Bacteria, UA RARE (*)    All other components within normal limits  URINE CULTURE  GC/CHLAMYDIA PROBE AMP ([place]) NOT AT [facility]    EKG None  Radiology No results found.  Procedures Procedures   Medications Ordered in ED Medications - No data to display  ED Course  I have reviewed the triage vital signs and the nursing notes.  Pertinent labs & imaging results that were available during my care of the patient were reviewed by me and considered in my medical decision making (see chart for details).    MDM Rules/Calculators/A&P                           Andrea Jenkins Andrea Jenkins presents with vaginal itching.  She has also developed some dysuria.  Low suspicion for sexually transmitted infection.  She has been treated for both BV and candidal vaginitis.  Urinalysis is equivocal for infection, but given her symptoms of dysuria, I  would err on the side of treating her.  Her itching is likely secondary to some contact dermatitis given absence of other  findings on wet prep and the fact that she has been treated for both BV and yeast.  She was given a topical corticosteroid for use in the genital area.  Return precautions were discussed. Final Clinical Impression(s) / ED Diagnoses Final diagnoses:  Acute vaginitis  Acute cystitis with hematuria    Rx / DC Orders ED Discharge Orders          Ordered    triamcinolone ointment (KENALOG) 0.5 %  2 times daily        07/03/21 1028    nitrofurantoin, macrocrystal-monohydrate, (MACROBID) 100 MG capsule  2 times daily        07/03/21 1029             Koleen Distance, MD 07/03/21 1030

## 2021-07-03 NOTE — ED Notes (Signed)
Pt dc home ambulatory. Reviewed dc instructions with patient. Pt voiced understanding.

## 2021-07-05 LAB — URINE CULTURE: Culture: 40000 — AB

## 2021-07-06 ENCOUNTER — Telehealth: Payer: Self-pay | Admitting: *Deleted

## 2021-07-06 LAB — GC/CHLAMYDIA PROBE AMP (~~LOC~~) NOT AT ARMC
Chlamydia: NEGATIVE
Comment: NEGATIVE
Comment: NORMAL
Neisseria Gonorrhea: NEGATIVE

## 2021-07-06 NOTE — Telephone Encounter (Signed)
Post ED Visit - Positive Culture Follow-up: Unsuccessful Patient Follow-up  Culture assessed and recommendations reviewed by:  []  , Pharm.D. []  Enzo Bi, Pharm.D., BCPS AQ-ID []  , Pharm.D., BCPS []  Celedonio Miyamoto, .D., BCPS []  [place], .D., BCPS, AAHIVP []  Georgina Pillion, Pharm.D., BCPS, AAHIVP []  [address], PharmD []  , PharmD, BCPS  Positive urine culture  []  Patient discharged without antimicrobial prescription and treatment is now indicated [x]  Organism is resistant to prescribed ED discharge antimicrobial []  Patient with positive blood cultures  Plan:  If symptomatic d/c Macrobid and Start Cefdinir 300mg  PO BID x 5 days, if asymptomatic no further tx needed.  [place], PA-C  Unable to contact patient after 3 attempts, letter will be sent to address on file  [place] 07/06/2021, 8:33 AM

## 2021-07-06 NOTE — Progress Notes (Signed)
ED Antimicrobial Stewardship Positive Culture Follow Up   Andrea Jenkins is an 25 y.o. female who presented to [facility] on 07/03/2021 with a chief complaint of  Chief Complaint  Patient presents with   Vaginal rash   Recent Results (from the past 720 hour(s))  Wet prep, genital     Status: Abnormal   Collection Time: 07/03/21 10:00 AM   Specimen: Cervix  Result Value Ref Range Status   Yeast Wet Prep HPF POC NONE SEEN NONE SEEN Final   Trich, Wet Prep NONE SEEN NONE SEEN Final   Clue Cells Wet Prep HPF POC NONE SEEN NONE SEEN Final   WBC, Wet Prep HPF POC MANY (A) NONE SEEN Final   Sperm NONE SEEN  Final    Comment: Performed at [facility], [address], [place], [place] [address]  Urine Culture     Status: Abnormal   Collection Time: 07/03/21 10:00 AM   Specimen: Urine, Clean Catch  Result Value Ref Range Status   Specimen Description   Final    URINE, CLEAN CATCH Performed at [facility], [address], [place], [place] [address]    Special Requests   Final    NONE Performed at [facility], [address] [place], [place] [address]    Culture (A)  Final    40,000 COLONIES/mL ENTEROBACTER AEROGENES 80,000 COLONIES/mL STREPTOCOCCUS AGALACTIAE TESTING AGAINST S. AGALACTIAE NOT ROUTINELY PERFORMED DUE TO PREDICTABILITY OF AMP/PEN/VAN SUSCEPTIBILITY. Performed at [facility], [address] [place], [place] [address]    Report Status 07/05/2021 FINAL  Final   Organism ID, Bacteria ENTEROBACTER AEROGENES (A)  Final      Susceptibility   Enterobacter aerogenes - MIC*    CEFAZOLIN >=64 RESISTANT Resistant     CEFEPIME <=0.12 SENSITIVE Sensitive     CEFTRIAXONE <=0.25 SENSITIVE Sensitive     CIPROFLOXACIN <=0.25 SENSITIVE Sensitive     GENTAMICIN <=1 SENSITIVE Sensitive     IMIPENEM 1 SENSITIVE Sensitive     NITROFURANTOIN 64 INTERMEDIATE Intermediate     TRIMETH/SULFA <=20 SENSITIVE Sensitive      PIP/TAZO <=4 SENSITIVE Sensitive     * 40,000 COLONIES/mL ENTEROBACTER AEROGENES    [x]  Treated with MacroBID, organism resistant to prescribed antimicrobial []  Patient discharged originally without antimicrobial agent and treatment is now indicated  Recommendations: Call patient and perform UTI symptom check  (dysuria, urgency or frequency). If no symptoms noted then no changes needed. If patient reporting symptoms of UTI, then discontinue MacroBID and prescribe Cefdinir 300mg  PO BID x 5 days  ED Provider: , PA-C  , PharmD, [facility] Clinical Pharmacist ED RPh Phone: [phone] Main RX: [phone]

## 2021-07-08 ENCOUNTER — Telehealth: Payer: Self-pay

## 2021-07-08 NOTE — Telephone Encounter (Signed)
Post ED Visit - Positive Culture Follow-up: Successful Patient Follow-Up  Culture assessed and recommendations reviewed by:  []  , Pharm.D. []  Enzo Bi, Pharm.D., BCPS AQ-ID []  , Pharm.D., BCPS []  Celedonio Miyamoto, Pharm.D., BCPS []  [place], Garvin Fila.D., BCPS, AAHIVP []  , Pharm.D., BCPS, AAHIVP []  Georgina Pillion, PharmD, BCPS []  , PharmD, BCPS []  [place], PharmD, BCPS []  [place], PharmD  Positive urine culture  []  Patient discharged without antimicrobial prescription and treatment is now indicated [x]  Organism is resistant to prescribed ED discharge antimicrobial []  Patient with positive blood cultures  Changes discussed with ED provider: , PA Per patient not having any symptoms of UTI per plan no further treatment necessary.   Contacted patient, date 07/08/2021, time 3:25 pm   07/08/2021, 3:29 PM

## 2021-08-31 ENCOUNTER — Other Ambulatory Visit: Payer: Self-pay

## 2021-08-31 ENCOUNTER — Other Ambulatory Visit (HOSPITAL_COMMUNITY)
Admission: RE | Admit: 2021-08-31 | Discharge: 2021-08-31 | Disposition: A | Discharge status: Home / Self Care | Payer: Medicaid Other | Source: Ambulatory Visit | Attending: Obstetrics and Gynecology | Admitting: Obstetrics and Gynecology

## 2021-08-31 ENCOUNTER — Telehealth: Payer: Self-pay | Admitting: *Deleted

## 2021-08-31 ENCOUNTER — Ambulatory Visit (INDEPENDENT_AMBULATORY_CARE_PROVIDER_SITE_OTHER): Payer: Medicaid Other

## 2021-08-31 VITALS — BP 111/73 | HR 84 | Ht 63.5 in | Wt 184.0 lb

## 2021-08-31 DIAGNOSIS — N898 Other specified noninflammatory disorders of vagina: Secondary | ICD-10-CM | POA: Insufficient documentation

## 2021-08-31 DIAGNOSIS — R300 Dysuria: Secondary | ICD-10-CM

## 2021-08-31 LAB — POCT URINALYSIS DIPSTICK
Appearance: ABNORMAL
Bilirubin, UA: NEGATIVE
Glucose, UA: NEGATIVE
Ketones, UA: NEGATIVE
Nitrite, UA: NEGATIVE
Protein, UA: NEGATIVE
Spec Grav, UA: 1.02 (ref 1.010–1.025)
Urobilinogen, UA: 0.2 E.U./dL
pH, UA: 6 (ref 5.0–8.0)

## 2021-08-31 MED ORDER — NITROFURANTOIN MONOHYD MACRO 100 MG PO CAPS: 100.0000 mg | ORAL_CAPSULE | Freq: Two times a day (BID) | ORAL | 0 refills | Status: DC

## 2021-08-31 NOTE — Telephone Encounter (Signed)
TC from patient reports UTI symptoms. Informed patient that she would need to give a urine sample for UA dip and CX. Transferred to front office to make nurse visit.

## 2021-08-31 NOTE — Progress Notes (Signed)
SUBJECTIVE:  25 y.o. female complains of malodorous vaginal discharge for 1 day(s). Denies abnormal vaginal bleeding or significant pelvic pain or fever. Pt does complain of UTI symptoms. Denies history of known exposure to STD.  Patient's last menstrual period was 08/26/2021 (approximate).  OBJECTIVE:  She appears well, afebrile. Urine dipstick: positive for WBC's, positive for RBC's.  ASSESSMENT:  Dysuria Vaginal Discharge  Vaginal Odor   PLAN:  Urine culture, GC, chlamydia, trichomonas, BVAG, CVAG probe sent to lab. Treatment: To be determined once lab results are received ROV prn if symptoms persist or worsen.

## 2021-08-31 NOTE — Progress Notes (Signed)
Patient was assessed and managed by nursing staff during this encounter. I have reviewed the chart and agree with the documentation and plan. I have also made any necessary editorial changes.  Catalina Antig[place], MD 08/31/2021 1:47 PM

## 2021-09-03 LAB — CERVICOVAGINAL ANCILLARY ONLY
Bacterial Vaginitis (gardnerella): POSITIVE — AB
Chlamydia: NEGATIVE
Comment: NEGATIVE
Comment: NEGATIVE
Comment: NORMAL
Neisseria Gonorrhea: NEGATIVE

## 2021-09-05 MED ORDER — METRONIDAZOLE 500 MG PO TABS: 500.0000 mg | ORAL_TABLET | Freq: Two times a day (BID) | ORAL | 0 refills | Status: DC

## 2021-09-05 NOTE — Addendum Note (Signed)
Addended by: Catalina Antigua on: 09/05/2021 01:52 PM   Modules accepted: Orders

## 2021-09-07 ENCOUNTER — Other Ambulatory Visit: Payer: Self-pay

## 2021-09-07 ENCOUNTER — Emergency Department (HOSPITAL_BASED_OUTPATIENT_CLINIC_OR_DEPARTMENT_OTHER)
Admission: EM | Admit: 2021-09-07 | Discharge: 2021-09-07 | Disposition: A | Discharge status: Home / Self Care | Payer: Medicaid Other | Attending: Emergency Medicine | Admitting: Emergency Medicine

## 2021-09-07 ENCOUNTER — Encounter (HOSPITAL_BASED_OUTPATIENT_CLINIC_OR_DEPARTMENT_OTHER): Payer: Self-pay | Admitting: Emergency Medicine

## 2021-09-07 DIAGNOSIS — Z87891 Personal history of nicotine dependence: Secondary | ICD-10-CM | POA: Insufficient documentation

## 2021-09-07 DIAGNOSIS — J101 Influenza due to other identified influenza virus with other respiratory manifestations: Secondary | ICD-10-CM | POA: Insufficient documentation

## 2021-09-07 DIAGNOSIS — R509 Fever, unspecified: Secondary | ICD-10-CM | POA: Diagnosis present

## 2021-09-07 DIAGNOSIS — Z20822 Contact with and (suspected) exposure to covid-19: Secondary | ICD-10-CM | POA: Insufficient documentation

## 2021-09-07 DIAGNOSIS — J45909 Unspecified asthma, uncomplicated: Secondary | ICD-10-CM | POA: Diagnosis not present

## 2021-09-07 DIAGNOSIS — B349 Viral infection, unspecified: Secondary | ICD-10-CM | POA: Insufficient documentation

## 2021-09-07 LAB — RESP PANEL BY RT-PCR (FLU A&B, COVID) ARPGX2
Influenza A by PCR: POSITIVE — AB
Influenza B by PCR: NEGATIVE
SARS Coronavirus 2 by RT PCR: NEGATIVE

## 2021-09-07 NOTE — Discharge Instructions (Signed)
Take tylenol 2 pills 4 times a day and motrin 4 pills 3 times a day.  Drink plenty of fluids.  Return for worsening shortness of breath, headache, confusion. Follow up with your family doctor.   

## 2021-09-07 NOTE — ED Provider Notes (Signed)
[facility] EMERGENCY DEPT Provider Note   CSN: [id number] Arrival date & time: 09/07/21  1023     History Chief Complaint  Patient presents with   Nasal Congestion    Andrea Jenkins is a 25 y.o. female.  25 yo F with a cc of nasal congestion cough headache and diarrhea.  Going on for about 3 to 4 days.  Husband and children all have a similar illness.  One of her daughters tested positive for the flu earlier this week.  The history is provided by the patient.  Illness Severity:  Moderate Onset quality:  Gradual Duration:  4 days Timing:  Constant Progression:  Worsening Chronicity:  New Associated symptoms: diarrhea and fever   Associated symptoms: no chest pain, no congestion, no headaches, no myalgias, no nausea, no rhinorrhea, no shortness of breath, no vomiting and no wheezing       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    Past Surgical History:  Procedure Laterality Date   CATARACT EXTRACTION     WISDOM TOOTH EXTRACTION     WRIST SURGERY       OB History     Gravida  3   Para  3   Term  3   Preterm  0   AB  0   Living  3      SAB  0   IAB  0   Ectopic  0   Multiple  0   Live Births  3           Family History  Problem Relation Age of Onset   Hypertension Mother    Miscarriages / [place] Mother    Hypertension Maternal Grandmother    Asthma Maternal Grandmother    Diabetes Maternal Grandmother    Vision loss Maternal Grandmother    Mental illness Brother    Cancer Maternal Aunt    Mental illness Sister    Heart disease Neg Hx     Social History   Tobacco Use   Smoking status:  Former    Packs/day: 0.25    Types: Cigars, Cigarettes    Quit date: 03/11/2017    Years since quitting: 4.4   Smokeless tobacco: Never  Vaping Use   Vaping Use: Never used  Substance Use Topics   Alcohol use: Yes    Comment: occ   Drug use: Not Currently    Types: Marijuana    Comment: occasioinally    Home Medications Prior to Admission medications   Medication Sig Start Date End Date Taking? Authorizing Provider  ibuprofen (ADVIL) 600 MG tablet Take 1 tablet (600 mg total) by mouth every 6 (six) hours as needed. 03/12/21   Arabella Merles, CNM  metroNIDAZOLE (FLAGYL) 500 MG tablet Take 1 tablet (500 mg total) by mouth 2 (two) times daily. 09/05/21   Constant, Peggy, MD  nitrofurantoin, macrocrystal-monohydrate, (MACROBID) 100 MG capsule Take 1 capsule (100 mg total) by mouth 2 (two) times daily. 08/31/21   Constant, Peggy, MD  sertraline (ZOLOFT) 50 MG tablet Take 1 tablet (50  mg total) by mouth daily. Patient not taking: Reported on 08/31/2021 03/12/21   Arabella Merles, CNM  triamcinolone ointment (KENALOG) 0.5 % Apply 1 application topically 2 (two) times daily. Patient not taking: Reported on 08/31/2021 07/03/21   Koleen Distance, MD    Allergies    Peach [prunus persica]  Review of Systems   Review of Systems  Constitutional:  Positive for chills and fever.  HENT:  Negative for congestion and rhinorrhea.   Eyes:  Negative for redness and visual disturbance.  Respiratory:  Negative for shortness of breath and wheezing.   Cardiovascular:  Negative for chest pain and palpitations.  Gastrointestinal:  Positive for diarrhea. Negative for nausea and vomiting.  Genitourinary:  Negative for dysuria and urgency.  Musculoskeletal:  Negative for arthralgias and myalgias.  Skin:  Negative for pallor and wound.  Neurological:  Negative for dizziness and headaches.   Physical Exam Updated Vital Signs BP 111/73 (BP Location: Left Arm)   Pulse 64   Temp 98.4 F (36.9 C)   Resp 14    LMP 08/26/2021 (Approximate)   SpO2 99%   Physical Exam Vitals and nursing note reviewed.  Constitutional:      General: She is not in acute distress.    Appearance: She is well-developed. She is not diaphoretic.  HENT:     Head: Normocephalic and atraumatic.  Eyes:     Pupils: Pupils are equal, round, and reactive to light.  Pulmonary:     Effort: Pulmonary effort is normal.  Abdominal:     General: There is no distension.  Musculoskeletal:     Cervical back: Normal range of motion and neck supple.  Skin:    General: Skin is warm and dry.  Neurological:     Mental Status: She is alert and oriented to person, place, and time.  Psychiatric:        Behavior: Behavior normal.    ED Results / Procedures / Treatments   Labs (all labs ordered are listed, but only abnormal results are displayed) Labs Reviewed  RESP PANEL BY RT-PCR (FLU A&B, COVID) ARPGX2 - Abnormal; Notable for the following components:      Result Value   Influenza A by PCR POSITIVE (*)    All other components within normal limits    EKG None  Radiology No results found.  Procedures Procedures   Medications Ordered in ED Medications - No data to display  ED Course  I have reviewed the triage vital signs and the nursing notes.  Pertinent labs & imaging results that were available during my care of the patient were reviewed by me and considered in my medical decision making (see chart for details).    MDM Rules/Calculators/A&P                           25 yo F with a chief complaints of flulike symptoms whose family has previously tested positive for the flu.  She most likely has the flu.  Will treat symptomatically.  PCP follow-up.  12:44 PM:  I have discussed the diagnosis/risks/treatment options with the patient and believe the pt to be eligible for discharge home to follow-up with PCP. We also discussed returning to the ED immediately if new or worsening sx occur. We discussed the sx which  are most concerning (e.g., sudden worsening pain, fever, inability to tolerate by mouth) that necessitate immediate return. Medications administered to the patient during their visit and  any new prescriptions provided to the patient are listed below.  Medications given during this visit Medications - No data to display   The patient appears reasonably screen and/or stabilized for discharge and I doubt any other medical condition or other [facility] requiring further screening, evaluation, or treatment in the ED at this time prior to discharge.   Final Clinical Impression(s) / ED Diagnoses Final diagnoses:  Viral illness    Rx / DC Orders ED Discharge Orders     None        Melene Plan, DO 09/07/21 1244

## 2021-09-07 NOTE — ED Triage Notes (Signed)
Pt arrives to ED withy c/o nasal congestion, headache, diarrhea . The diarrhea and congestion started 3-4 days ago.

## 2021-09-07 NOTE — ED Notes (Signed)
Patient given snacks for her children and warm blankets for her and her significant other.

## 2021-09-09 LAB — URINE CULTURE

## 2021-10-25 DIAGNOSIS — T8859XA Other complications of anesthesia, initial encounter: Secondary | ICD-10-CM

## 2021-10-25 HISTORY — DX: Other complications of anesthesia, initial encounter: T88.59XA

## 2021-10-29 ENCOUNTER — Ambulatory Visit (INDEPENDENT_AMBULATORY_CARE_PROVIDER_SITE_OTHER): Payer: Medicaid Other

## 2021-10-29 ENCOUNTER — Other Ambulatory Visit: Payer: Self-pay

## 2021-10-29 ENCOUNTER — Other Ambulatory Visit (HOSPITAL_COMMUNITY)
Admission: RE | Admit: 2021-10-29 | Discharge: 2021-10-29 | Disposition: A | Discharge status: Home / Self Care | Payer: Medicaid Other | Source: Ambulatory Visit | Attending: Obstetrics and Gynecology | Admitting: Obstetrics and Gynecology

## 2021-10-29 ENCOUNTER — Encounter (HOSPITAL_BASED_OUTPATIENT_CLINIC_OR_DEPARTMENT_OTHER): Payer: Self-pay | Admitting: Obstetrics and Gynecology

## 2021-10-29 VITALS — BP 119/75 | HR 76 | Ht 63.5 in | Wt 188.0 lb

## 2021-10-29 DIAGNOSIS — R1011 Right upper quadrant pain: Secondary | ICD-10-CM | POA: Insufficient documentation

## 2021-10-29 DIAGNOSIS — Z113 Encounter for screening for infections with a predominantly sexual mode of transmission: Secondary | ICD-10-CM | POA: Diagnosis not present

## 2021-10-29 DIAGNOSIS — R112 Nausea with vomiting, unspecified: Secondary | ICD-10-CM | POA: Diagnosis not present

## 2021-10-29 DIAGNOSIS — Z5321 Procedure and treatment not carried out due to patient leaving prior to being seen by health care provider: Secondary | ICD-10-CM | POA: Diagnosis not present

## 2021-10-29 DIAGNOSIS — N898 Other specified noninflammatory disorders of vagina: Secondary | ICD-10-CM | POA: Insufficient documentation

## 2021-10-29 NOTE — ED Triage Notes (Signed)
Patient reports to the ER for RUQ abdominal pain. Patient reports LMP Dec. 9th. Patient reports pain has been going on 1.5hours. Patient reports she nausea and emesis.

## 2021-10-29 NOTE — Progress Notes (Signed)
SUBJECTIVE:  26 y.o. female complains of creamy, grey, and malodorous vaginal discharge, vaginal itching for 1 week(s). Denies abnormal vaginal bleeding or significant pelvic pain or fever. No UTI symptoms. Denies history of known exposure to STD.  Patient's last menstrual period was 10/02/2021 (exact date).  OBJECTIVE:  She appears well, afebrile. Urine dipstick: not done.  ASSESSMENT:  Vaginal Discharge  Vaginal Itching Vaginal Odor   PLAN:  GC, chlamydia, trichomonas, BVAG, CVAG probe sent to lab. Treatment: To be determined once lab results are received ROV prn if symptoms persist or worsen.

## 2021-10-29 NOTE — Progress Notes (Signed)
Patient was assessed and managed by nursing staff during this encounter. I have reviewed the chart and agree with the documentation and plan. I have also made any necessary editorial changes.  Mora Bellman, MD 10/29/2021 12:01 PM

## 2021-10-30 ENCOUNTER — Emergency Department (HOSPITAL_COMMUNITY): Payer: Medicaid Other | Admitting: Certified Registered Nurse Anesthetist

## 2021-10-30 ENCOUNTER — Encounter (HOSPITAL_COMMUNITY): Payer: Self-pay

## 2021-10-30 ENCOUNTER — Encounter (HOSPITAL_COMMUNITY)
Admission: EM | Disposition: A | Discharge status: Home / Self Care | Payer: Self-pay | Source: Home / Self Care | Attending: Emergency Medicine

## 2021-10-30 ENCOUNTER — Emergency Department (HOSPITAL_COMMUNITY): Payer: Medicaid Other

## 2021-10-30 ENCOUNTER — Emergency Department (HOSPITAL_BASED_OUTPATIENT_CLINIC_OR_DEPARTMENT_OTHER)
Admission: EM | Admit: 2021-10-30 | Discharge: 2021-10-30 | Disposition: A | Discharge status: Home / Self Care | Payer: Medicaid Other | Attending: Emergency Medicine | Admitting: Emergency Medicine

## 2021-10-30 ENCOUNTER — Observation Stay (HOSPITAL_COMMUNITY)
Admission: EM | Admit: 2021-10-30 | Discharge: 2021-10-31 | Disposition: A | Discharge status: Home / Self Care | Payer: Medicaid Other | Attending: Surgery | Admitting: Surgery

## 2021-10-30 ENCOUNTER — Other Ambulatory Visit: Payer: Self-pay

## 2021-10-30 DIAGNOSIS — K8020 Calculus of gallbladder without cholecystitis without obstruction: Secondary | ICD-10-CM | POA: Diagnosis not present

## 2021-10-30 DIAGNOSIS — K810 Acute cholecystitis: Secondary | ICD-10-CM | POA: Diagnosis present

## 2021-10-30 DIAGNOSIS — Z87891 Personal history of nicotine dependence: Secondary | ICD-10-CM | POA: Diagnosis not present

## 2021-10-30 DIAGNOSIS — K819 Cholecystitis, unspecified: Secondary | ICD-10-CM

## 2021-10-30 DIAGNOSIS — Z20822 Contact with and (suspected) exposure to covid-19: Secondary | ICD-10-CM | POA: Insufficient documentation

## 2021-10-30 DIAGNOSIS — R1011 Right upper quadrant pain: Secondary | ICD-10-CM | POA: Diagnosis not present

## 2021-10-30 DIAGNOSIS — K8000 Calculus of gallbladder with acute cholecystitis without obstruction: Secondary | ICD-10-CM | POA: Diagnosis not present

## 2021-10-30 DIAGNOSIS — K8010 Calculus of gallbladder with chronic cholecystitis without obstruction: Secondary | ICD-10-CM | POA: Diagnosis not present

## 2021-10-30 HISTORY — PX: CHOLECYSTECTOMY: SHX55

## 2021-10-30 LAB — CERVICOVAGINAL ANCILLARY ONLY
Bacterial Vaginitis (gardnerella): NEGATIVE
Candida Glabrata: NEGATIVE
Candida Vaginitis: NEGATIVE
Chlamydia: NEGATIVE
Comment: NEGATIVE
Comment: NEGATIVE
Comment: NEGATIVE
Comment: NEGATIVE
Comment: NEGATIVE
Comment: NORMAL
Neisseria Gonorrhea: NEGATIVE
Trichomonas: NEGATIVE

## 2021-10-30 LAB — CBC
HCT: 38.3 % (ref 36.0–46.0)
Hemoglobin: 12.1 g/dL (ref 12.0–15.0)
MCH: 26.9 pg (ref 26.0–34.0)
MCHC: 31.6 g/dL (ref 30.0–36.0)
MCV: 85.1 fL (ref 80.0–100.0)
Platelets: 178 10*3/uL (ref 150–400)
RBC: 4.5 MIL/uL (ref 3.87–5.11)
RDW: 13.2 % (ref 11.5–15.5)
WBC: 5 10*3/uL (ref 4.0–10.5)
nRBC: 0 % (ref 0.0–0.2)

## 2021-10-30 LAB — URINALYSIS, ROUTINE W REFLEX MICROSCOPIC
Bilirubin Urine: NEGATIVE
Glucose, UA: NEGATIVE mg/dL
Hgb urine dipstick: NEGATIVE
Ketones, ur: NEGATIVE mg/dL
Leukocytes,Ua: NEGATIVE
Nitrite: NEGATIVE
Protein, ur: NEGATIVE mg/dL
Specific Gravity, Urine: 1.026 (ref 1.005–1.030)
pH: 5 (ref 5.0–8.0)

## 2021-10-30 LAB — COMPREHENSIVE METABOLIC PANEL
ALT: 12 U/L (ref 0–44)
AST: 15 U/L (ref 15–41)
Albumin: 4.2 g/dL (ref 3.5–5.0)
Alkaline Phosphatase: 41 U/L (ref 38–126)
Anion gap: 7 (ref 5–15)
BUN: 10 mg/dL (ref 6–20)
CO2: 23 mmol/L (ref 22–32)
Calcium: 9 mg/dL (ref 8.9–10.3)
Chloride: 107 mmol/L (ref 98–111)
Creatinine, Ser: 0.81 mg/dL (ref 0.44–1.00)
GFR, Estimated: >60 mL/min (ref >60)
Glucose, Bld: 89 mg/dL (ref 70–99)
Potassium: 3.8 mmol/L (ref 3.5–5.1)
Sodium: 137 mmol/L (ref 135–145)
Total Bilirubin: 0.2 mg/dL — ABNORMAL LOW (ref 0.3–1.2)
Total Protein: 7.3 g/dL (ref 6.5–8.1)

## 2021-10-30 LAB — RESP PANEL BY RT-PCR (FLU A&B, COVID) ARPGX2
Influenza A by PCR: NEGATIVE
Influenza B by PCR: NEGATIVE
SARS Coronavirus 2 by RT PCR: NEGATIVE

## 2021-10-30 LAB — LIPASE, BLOOD: Lipase: 57 U/L — ABNORMAL HIGH (ref 11–51)

## 2021-10-30 LAB — POC URINE PREG, ED: Preg Test, Ur: NEGATIVE

## 2021-10-30 SURGERY — LAPAROSCOPIC CHOLECYSTECTOMY
Anesthesia: Choice

## 2021-10-30 MED ORDER — MIDAZOLAM HCL 5 MG/5ML IJ SOLN
INTRAMUSCULAR | Status: DC | PRN
  Administered 2021-10-30: 2 mg via INTRAVENOUS

## 2021-10-30 MED ORDER — SODIUM CHLORIDE 0.9 % IV SOLN
2.0000 g | Freq: Once | INTRAVENOUS | Status: AC
  Administered 2021-10-30: 2 g via INTRAVENOUS
  Filled 2021-10-30: qty 20

## 2021-10-30 MED ORDER — SODIUM CHLORIDE 0.9 % IV BOLUS
1000.0000 mL | Freq: Once | INTRAVENOUS | Status: AC
  Administered 2021-10-30: 1000 mL via INTRAVENOUS

## 2021-10-30 MED ORDER — SCOPOLAMINE 1 MG/3DAYS TD PT72
1.0000 | MEDICATED_PATCH | TRANSDERMAL | Status: DC
  Administered 2021-10-30: 1.5 mg via TRANSDERMAL

## 2021-10-30 MED ORDER — DEXAMETHASONE SODIUM PHOSPHATE 4 MG/ML IJ SOLN
INTRAMUSCULAR | Status: DC | PRN
  Administered 2021-10-30: 10 mg via INTRAVENOUS

## 2021-10-30 MED ORDER — RINGERS IRRIGATION IR SOLN
Status: DC | PRN
  Administered 2021-10-30: 1

## 2021-10-30 MED ORDER — BUPIVACAINE-EPINEPHRINE 0.25% -1:200000 IJ SOLN
INTRAMUSCULAR | Status: DC | PRN
  Administered 2021-10-30: 30 mL

## 2021-10-30 MED ORDER — LIDOCAINE 2% (20 MG/ML) 5 ML SYRINGE
INTRAMUSCULAR | Status: DC | PRN
  Administered 2021-10-30: 60 mg via INTRAVENOUS

## 2021-10-30 MED ORDER — FENTANYL CITRATE (PF) 100 MCG/2ML IJ SOLN
INTRAMUSCULAR | Status: DC | PRN
  Administered 2021-10-30 (×2): 100 ug via INTRAVENOUS

## 2021-10-30 MED ORDER — KETOROLAC TROMETHAMINE 15 MG/ML IJ SOLN
15.0000 mg | Freq: Three times a day (TID) | INTRAMUSCULAR | Status: DC
  Administered 2021-10-30 – 2021-10-31 (×2): 15 mg via INTRAVENOUS
  Filled 2021-10-30 (×2): qty 1

## 2021-10-30 MED ORDER — BUPIVACAINE-EPINEPHRINE (PF) 0.25% -1:200000 IJ SOLN
INTRAMUSCULAR | Status: AC
  Filled 2021-10-30: qty 30

## 2021-10-30 MED ORDER — PROPOFOL 10 MG/ML IV BOLUS
INTRAVENOUS | Status: DC | PRN
Start: 2021-10-30 — End: 2021-10-30
  Administered 2021-10-30: 200 mg via INTRAVENOUS

## 2021-10-30 MED ORDER — PROPOFOL 10 MG/ML IV BOLUS
INTRAVENOUS | Status: AC
  Filled 2021-10-30: qty 20

## 2021-10-30 MED ORDER — FENTANYL CITRATE (PF) 100 MCG/2ML IJ SOLN
INTRAMUSCULAR | Status: AC
  Filled 2021-10-30: qty 2

## 2021-10-30 MED ORDER — DEXAMETHASONE SODIUM PHOSPHATE 10 MG/ML IJ SOLN
INTRAMUSCULAR | Status: AC
  Filled 2021-10-30: qty 1

## 2021-10-30 MED ORDER — 0.9 % SODIUM CHLORIDE (POUR BTL) OPTIME
TOPICAL | Status: DC | PRN
  Administered 2021-10-30: 1000 mL

## 2021-10-30 MED ORDER — ROCURONIUM BROMIDE 10 MG/ML (PF) SYRINGE
PREFILLED_SYRINGE | INTRAVENOUS | Status: AC
  Filled 2021-10-30: qty 10

## 2021-10-30 MED ORDER — KETOROLAC TROMETHAMINE 30 MG/ML IJ SOLN
INTRAMUSCULAR | Status: DC | PRN
  Administered 2021-10-30: 30 mg via INTRAVENOUS

## 2021-10-30 MED ORDER — DOCUSATE SODIUM 100 MG PO CAPS
100.0000 mg | ORAL_CAPSULE | Freq: Two times a day (BID) | ORAL | Status: DC
  Administered 2021-10-30 – 2021-10-31 (×2): 100 mg via ORAL
  Filled 2021-10-30 (×2): qty 1

## 2021-10-30 MED ORDER — LIDOCAINE HCL (PF) 2 % IJ SOLN
INTRAMUSCULAR | Status: AC
  Filled 2021-10-30: qty 5

## 2021-10-30 MED ORDER — ACETAMINOPHEN 500 MG PO TABS
1000.0000 mg | ORAL_TABLET | Freq: Three times a day (TID) | ORAL | Status: DC
  Administered 2021-10-30 – 2021-10-31 (×3): 1000 mg via ORAL
  Filled 2021-10-30 (×3): qty 2

## 2021-10-30 MED ORDER — ONDANSETRON HCL 4 MG/2ML IJ SOLN: 4.0000 mg | Freq: Four times a day (QID) | INTRAMUSCULAR | Status: DC | PRN

## 2021-10-30 MED ORDER — PHENYLEPHRINE 40 MCG/ML (10ML) SYRINGE FOR IV PUSH (FOR BLOOD PRESSURE SUPPORT)
PREFILLED_SYRINGE | INTRAVENOUS | Status: AC
  Filled 2021-10-30: qty 10

## 2021-10-30 MED ORDER — NALOXONE HCL 0.4 MG/ML IJ SOLN
INTRAMUSCULAR | Status: AC
  Filled 2021-10-30: qty 1

## 2021-10-30 MED ORDER — LACTATED RINGERS IV SOLN: INTRAVENOUS | Status: DC

## 2021-10-30 MED ORDER — TRAMADOL HCL 50 MG PO TABS: 50.0000 mg | ORAL_TABLET | Freq: Four times a day (QID) | ORAL | Status: DC | PRN

## 2021-10-30 MED ORDER — ONDANSETRON HCL 4 MG/2ML IJ SOLN
INTRAMUSCULAR | Status: AC
  Filled 2021-10-30: qty 2

## 2021-10-30 MED ORDER — DIPHENHYDRAMINE HCL 25 MG PO CAPS: 25.0000 mg | ORAL_CAPSULE | Freq: Four times a day (QID) | ORAL | Status: DC | PRN

## 2021-10-30 MED ORDER — ONDANSETRON 4 MG PO TBDP: 4.0000 mg | ORAL_TABLET | Freq: Four times a day (QID) | ORAL | Status: DC | PRN

## 2021-10-30 MED ORDER — SUGAMMADEX SODIUM 200 MG/2ML IV SOLN
INTRAVENOUS | Status: DC | PRN
  Administered 2021-10-30: 200 mg via INTRAVENOUS

## 2021-10-30 MED ORDER — ENOXAPARIN SODIUM 40 MG/0.4ML IJ SOSY
40.0000 mg | PREFILLED_SYRINGE | INTRAMUSCULAR | Status: DC
  Filled 2021-10-30: qty 0.4

## 2021-10-30 MED ORDER — ROCURONIUM BROMIDE 10 MG/ML (PF) SYRINGE
PREFILLED_SYRINGE | INTRAVENOUS | Status: DC | PRN
  Administered 2021-10-30: 20 mg via INTRAVENOUS
  Administered 2021-10-30: 50 mg via INTRAVENOUS

## 2021-10-30 MED ORDER — HYDROMORPHONE HCL 2 MG/ML IJ SOLN
INTRAMUSCULAR | Status: AC
  Filled 2021-10-30: qty 1

## 2021-10-30 MED ORDER — HYDROMORPHONE HCL 1 MG/ML IJ SOLN
INTRAMUSCULAR | Status: DC | PRN
  Administered 2021-10-30: 1 mg via INTRAVENOUS

## 2021-10-30 MED ORDER — SCOPOLAMINE 1 MG/3DAYS TD PT72
MEDICATED_PATCH | TRANSDERMAL | Status: AC
  Filled 2021-10-30: qty 1

## 2021-10-30 MED ORDER — DIPHENHYDRAMINE HCL 50 MG/ML IJ SOLN: 25.0000 mg | Freq: Four times a day (QID) | INTRAMUSCULAR | Status: DC | PRN

## 2021-10-30 MED ORDER — NALOXONE HCL 0.4 MG/ML IJ SOLN
INTRAMUSCULAR | Status: DC | PRN
  Administered 2021-10-30: 120 ug via INTRAVENOUS
  Administered 2021-10-30: 80 ug via INTRAVENOUS

## 2021-10-30 MED ORDER — ONDANSETRON HCL 4 MG/2ML IJ SOLN
INTRAMUSCULAR | Status: DC | PRN
  Administered 2021-10-30: 4 mg via INTRAVENOUS

## 2021-10-30 MED ORDER — SERTRALINE HCL 50 MG PO TABS: 50.0000 mg | ORAL_TABLET | Freq: Every day | ORAL | Status: DC

## 2021-10-30 MED ORDER — METHOCARBAMOL 500 MG PO TABS
500.0000 mg | ORAL_TABLET | Freq: Four times a day (QID) | ORAL | Status: DC
  Administered 2021-10-30 – 2021-10-31 (×3): 500 mg via ORAL
  Filled 2021-10-30 (×3): qty 1

## 2021-10-30 MED ORDER — CHLORHEXIDINE GLUCONATE 0.12% ORAL RINSE (MEDLINE KIT)
15.0000 mL | Freq: Once | OROMUCOSAL | Status: AC
  Administered 2021-10-30: 15 mL via OROMUCOSAL

## 2021-10-30 MED ORDER — ACETAMINOPHEN 500 MG PO TABS
1000.0000 mg | ORAL_TABLET | Freq: Once | ORAL | Status: AC
  Administered 2021-10-30: 1000 mg via ORAL

## 2021-10-30 MED ORDER — ACETAMINOPHEN 500 MG PO TABS
ORAL_TABLET | ORAL | Status: AC
  Filled 2021-10-30: qty 2

## 2021-10-30 MED ORDER — MIDAZOLAM HCL 2 MG/2ML IJ SOLN
INTRAMUSCULAR | Status: AC
  Filled 2021-10-30: qty 2

## 2021-10-30 MED ORDER — PHENYLEPHRINE 40 MCG/ML (10ML) SYRINGE FOR IV PUSH (FOR BLOOD PRESSURE SUPPORT)
PREFILLED_SYRINGE | INTRAVENOUS | Status: DC | PRN
  Administered 2021-10-30: 160 ug via INTRAVENOUS
  Administered 2021-10-30 (×2): 120 ug via INTRAVENOUS

## 2021-10-30 SURGICAL SUPPLY — 41 items
ADH SKN CLS APL DERMABOND .7 (GAUZE/BANDAGES/DRESSINGS) ×1
APPLIER CLIP 5 13 M/L LIGAMAX5 (MISCELLANEOUS) ×2
BAG COUNTER SPONGE SURGICOUNT (BAG) IMPLANT
CHLORAPREP W/TINT 26 (MISCELLANEOUS) ×2 IMPLANT
CLIP APPLIE 5 13 M/L LIGAMAX5 (MISCELLANEOUS) ×1 IMPLANT
COVER SURGICAL LIGHT HANDLE (MISCELLANEOUS) ×2 IMPLANT
DECANTER SPIKE VIAL GLASS SM (MISCELLANEOUS) ×1 IMPLANT
DERMABOND ADVANCED (GAUZE/BANDAGES/DRESSINGS) ×1
DERMABOND ADVANCED .7 DNX12 (GAUZE/BANDAGES/DRESSINGS) ×1 IMPLANT
DRAPE C-ARM 42X120 X-RAY (DRAPES) IMPLANT
DRAPE SHEET LG 3/4 BI-LAMINATE (DRAPES) IMPLANT
ELECT L-HOOK LAP 45CM DISP (ELECTROSURGICAL)
ELECT PENCIL ROCKER SW 15FT (MISCELLANEOUS) ×2 IMPLANT
ELECT REM PT RETURN 15FT ADLT (MISCELLANEOUS) ×2 IMPLANT
ELECTRODE L-HOOK LAP 45CM DISP (ELECTROSURGICAL) IMPLANT
GLOVE SURG POLYISO LF SZ5.5 (GLOVE) ×2 IMPLANT
GLOVE SURG UNDER POLY LF SZ6 (GLOVE) ×2 IMPLANT
GOWN STRL REUS W/TWL LRG LVL3 (GOWN DISPOSABLE) ×2 IMPLANT
GOWN STRL REUS W/TWL XL LVL3 (GOWN DISPOSABLE) ×4 IMPLANT
GRASPER SUT TROCAR 14GX15 (MISCELLANEOUS) IMPLANT
HEMOSTAT SNOW SURGICEL 2X4 (HEMOSTASIS) IMPLANT
IRRIG SUCT STRYKERFLOW 2 WTIP (MISCELLANEOUS) ×2
IRRIGATION SUCT STRKRFLW 2 WTP (MISCELLANEOUS) ×1 IMPLANT
KIT BASIN OR (CUSTOM PROCEDURE TRAY) ×2 IMPLANT
KIT TURNOVER KIT A (KITS) IMPLANT
L-HOOK LAP DISP 36CM (ELECTROSURGICAL) ×2
LHOOK LAP DISP 36CM (ELECTROSURGICAL) ×1 IMPLANT
NDL INSUFFLATION 14GA 120MM (NEEDLE) IMPLANT
NEEDLE INSUFFLATION 14GA 120MM (NEEDLE) IMPLANT
POUCH SPECIMEN RETRIEVAL 10MM (ENDOMECHANICALS) ×2 IMPLANT
SCISSORS LAP 5X35 DISP (ENDOMECHANICALS) ×2 IMPLANT
SET CHOLANGIOGRAPH MIX (MISCELLANEOUS) IMPLANT
SET TUBE SMOKE EVAC HIGH FLOW (TUBING) ×2 IMPLANT
SLEEVE XCEL OPT CAN 5 100 (ENDOMECHANICALS) ×4 IMPLANT
SUT MNCRL AB 4-0 PS2 18 (SUTURE) ×2 IMPLANT
TOWEL OR 17X26 10 PK STRL BLUE (TOWEL DISPOSABLE) ×2 IMPLANT
TOWEL OR NON WOVEN STRL DISP B (DISPOSABLE) IMPLANT
TRAY LAPAROSCOPIC (CUSTOM PROCEDURE TRAY) ×2 IMPLANT
TROCAR BLADELESS OPT 5 100 (ENDOMECHANICALS) ×2 IMPLANT
TROCAR XCEL 12X100 BLDLESS (ENDOMECHANICALS) IMPLANT
TROCAR XCEL BLUNT TIP 100MML (ENDOMECHANICALS) IMPLANT

## 2021-10-30 NOTE — ED Provider Triage Note (Signed)
Emergency Medicine Provider Triage Evaluation Note  Andrea Jenkins , a 26 y.o. female  was evaluated in triage.   26 year old female reports her self is otherwise healthy no daily medication use presented for right upper quadrant abdominal pain onset last night, no clear inciting event, she describes pain as sharp severe nonradiating and without clear aggravating or alleviating factors.  Patient reports he went to [facility] ER last night and had blood work performed but then left without being seen by a provider.  Pain has somewhat improved.  Patient reports last night she had several episodes of nonbloody/nonbilious emesis which is also gradually improved.  Of note patient does report increased vaginal discharge over the past few days but reports this is being worked up by her OB/GYN and she is awaiting STI test results.  Review of Systems  Positive: Right upper quadrant abdominal pain, nausea, vomiting Negative: Fever, chills, chest pain, shortness of breath, diarrhea, dysuria, hematuria or any additional concerns.  Physical Exam  BP 114/67 (BP Location: Right Arm)    Pulse 72    Temp 97.9 F (36.6 C) (Oral)    Resp 17    LMP 10/02/2021 (Exact Date) Comment: IUD   SpO2 100%  Gen:   Awake, no distress   Resp:  Normal effort  MSK:   Moves extremities without difficulty  Other:  RUQ abdominal tenderness, positive Murphy sign.  No RLQ tenderness.  No left-sided tenderness.  Medical Decision Making  Medically screening exam initiated at 10:35 AM.  Appropriate orders placed.  [name] was informed that the remainder of the evaluation will be completed by another provider, this initial triage assessment does not replace that evaluation, and the importance of remaining in the ED until their evaluation is complete.  Labs from [facility] ER obtained just after midnight today include CBC which is within normal limits.  Lipase which is minimally elevated at 57.  CMP which shows slightly  decreased bilirubin otherwise within normal limits.  Will obtain urinalysis and urine pregnancy test.  RUQ ultrasound ordered.  Note: Portions of this report may have been transcribed using voice recognition software. Every effort was made to ensure accuracy; however, inadvertent computerized transcription errors may still be present.    [name], PA-C 10/30/21 1038

## 2021-10-30 NOTE — Anesthesia Procedure Notes (Signed)
Procedure Name: Intubation Date/Time: 10/30/2021 3:26 PM Performed by: Claudia Desanctis, CRNA Pre-anesthesia Checklist: Patient identified, Emergency Drugs available, Suction available and Patient being monitored Patient Re-evaluated:Patient Re-evaluated prior to induction Oxygen Delivery Method: Circle system utilized Preoxygenation: Pre-oxygenation with 100% oxygen Induction Type: IV induction Ventilation: Mask ventilation without difficulty Laryngoscope Size: 2 and Miller Grade View: Grade I Tube type: Oral Tube size: 7.0 mm Number of attempts: 1 Airway Equipment and Method: Stylet Placement Confirmation: ETT inserted through vocal cords under direct vision, positive ETCO2 and breath sounds checked- equal and bilateral Tube secured with: Tape Dental Injury: Teeth and Oropharynx as per pre-operative assessment

## 2021-10-30 NOTE — Discharge Instructions (Addendum)
[facility] DISCHARGE INSTRUCTIONS  Activity No heavy lifting greater than 15 pounds for 4 weeks after surgery. Ok to shower in 24 hours, but do not bathe or submerge incisions underwater. Do not drive while taking narcotic pain medication.  Wound Care Your incisions are covered with skin glue called Dermabond. This will peel off on its own over time. You may shower and allow warm soapy water to run over your incisions. Gently pat dry. Do not submerge your incision underwater. Monitor your incision for any new redness, tenderness, or drainage.  When to Call us: Fever greater than 100.5 New redness, drainage, or swelling at incision site Severe pain, nausea, or vomiting Jaundice (yellowing of the whites of the eyes or skin)  Follow-up You will have a follow up visit in 3-4 weeks. Our office will contact you to schedule this appointment. This will be at the [facility] office at [address], [address], [place], [place]. Please arrive at least 15 minutes prior to your scheduled appointment time.  For questions or concerns, please call the office at [phone].     Managing Your Pain After Surgery Without Opioids    Thank you for participating in our program to help patients manage their pain after surgery without opioids. This is part of our effort to provide you with the best care possible, without exposing you or your family to the risk that opioids pose.  What pain can I expect after surgery? You can expect to have some pain after surgery. This is normal. The pain is typically worse the day after surgery, and quickly begins to get better. Many studies have found that many patients are able to manage their pain after surgery with Over-the-Counter (OTC) medications such as Tylenol and Motrin. If you have a condition that does not allow you to take Tylenol or Motrin, notify your surgical team.  How will I manage my pain? The best strategy for  controlling your pain after surgery is around the clock pain control with Tylenol (acetaminophen) and Motrin (ibuprofen or Advil). Alternating these medications with each other allows you to maximize your pain control. In addition to Tylenol and Motrin, you can use heating pads or ice packs on your incisions to help reduce your pain.  How will I alternate your regular strength over-the-counter pain medication? You will take a dose of pain medication every three hours. Start by taking 650 mg of Tylenol (2 pills of 325 mg) 3 hours later take 600 mg of Motrin (3 pills of 200 mg) 3 hours after taking the Motrin take 650 mg of Tylenol 3 hours after that take 600 mg of Motrin.   - 1 -  See example - if your first dose of Tylenol is at 12:00 PM   12:00 PM Tylenol 650 mg (2 pills of 325 mg)  3:00 PM Motrin 600 mg (3 pills of 200 mg)  6:00 PM Tylenol 650 mg (2 pills of 325 mg)  9:00 PM Motrin 600 mg (3 pills of 200 mg)  Continue alternating every 3 hours   We recommend that you follow this schedule around-the-clock for at least 3 days after surgery, or until you feel that it is no longer needed. Use the table on the last page of this handout to keep track of the medications you are taking. Important: Do not take more than 3000mg  of Tylenol or 3200mg  of Motrin in a 24-hour period. Do not take ibuprofen/Motrin if you have a history of  bleeding stomach ulcers, severe kidney disease, &/or actively taking a blood thinner  What if I still have pain? If you have pain that is not controlled with the over-the-counter pain medications (Tylenol and Motrin or Advil) you might have what we call breakthrough pain. You will receive a prescription for a small amount of an opioid pain medication such as Oxycodone, Tramadol, or Tylenol with Codeine. Use these opioid pills in the first 24 hours after surgery if you have breakthrough pain. Do not take more than 1 pill every 4-6 hours.  If you still have  uncontrolled pain after using all opioid pills, don't hesitate to call our staff using the number provided. We will help make sure you are managing your pain in the best way possible, and if necessary, we can provide a prescription for additional pain medication.   Day 1    Time  Name of Medication Number of pills taken  Amount of Acetaminophen  Pain Level   Comments  AM PM       AM PM       AM PM       AM PM       AM PM       AM PM       AM PM       AM PM       Total Daily amount of Acetaminophen Do not take more than  3,000 mg per day      Day 2    Time  Name of Medication Number of pills taken  Amount of Acetaminophen  Pain Level   Comments  AM PM       AM PM       AM PM       AM PM       AM PM       AM PM       AM PM       AM PM       Total Daily amount of Acetaminophen Do not take more than  3,000 mg per day      Day 3    Time  Name of Medication Number of pills taken  Amount of Acetaminophen  Pain Level   Comments  AM PM       AM PM       AM PM       AM PM          AM PM       AM PM       AM PM       AM PM       Total Daily amount of Acetaminophen Do not take more than  3,000 mg per day      Day 4    Time  Name of Medication Number of pills taken  Amount of Acetaminophen  Pain Level   Comments  AM PM       AM PM       AM PM       AM PM       AM PM       AM PM       AM PM       AM PM       Total Daily amount of Acetaminophen Do not take more than  3,000 mg per day      Day 5    Time  Name of Medication Number of pills taken  Amount  of Acetaminophen  Pain Level   Comments  AM PM       AM PM       AM PM       AM PM       AM PM       AM PM       AM PM       AM PM       Total Daily amount of Acetaminophen Do not take more than  3,000 mg per day       Day 6    Time  Name of Medication Number of pills taken  Amount of Acetaminophen  Pain Level  Comments  AM PM       AM PM       AM PM       AM PM        AM PM       AM PM       AM PM       AM PM       Total Daily amount of Acetaminophen Do not take more than  3,000 mg per day      Day 7    Time  Name of Medication Number of pills taken  Amount of Acetaminophen  Pain Level   Comments  AM PM       AM PM       AM PM       AM PM       AM PM       AM PM       AM PM       AM PM       Total Daily amount of Acetaminophen Do not take more than  3,000 mg per day        For additional information about how and where to safely dispose of unused opioid medications - [email or web address]  Disclaimer: This document contains information and/or instructional materials adapted from [facility] for the typical patient with your condition. It does not replace medical advice from your health care provider because your experience may differ from that of the typical patient. Talk to your health care provider if you have any questions about this document, your condition or your treatment plan. Adapted from [facility]

## 2021-10-30 NOTE — ED Provider Notes (Signed)
[facility] DEPT Provider Note   CSN: [id number] Arrival date & time: 10/30/21  1017     History  Chief Complaint  Patient presents with   Abdominal Pain   Emesis    Andrea Jenkins is a 26 y.o. female    26 year old female reports her self is otherwise healthy no daily medication use presented for right upper quadrant abdominal pain onset last night, no clear inciting event, she describes pain as sharp severe nonradiating and without clear aggravating or alleviating factors.  Patient reports he went to [facility] ER last night and had blood work performed but then left without being seen by a provider.  Pain has somewhat improved.  Patient reports last night she had several episodes of nonbloody/nonbilious emesis which is also gradually improved.   Of note patient does report increased vaginal discharge over the past few days but reports this is being worked up by her OB/GYN and she is awaiting STI test results.  HPI     Home Medications Prior to Admission medications   Medication Sig Start Date End Date Taking? Authorizing Provider  ibuprofen (ADVIL) 600 MG tablet Take 1 tablet (600 mg total) by mouth every 6 (six) hours as needed. 03/12/21   Myrtis Ser, CNM  metroNIDAZOLE (FLAGYL) 500 MG tablet Take 1 tablet (500 mg total) by mouth 2 (two) times daily. Patient not taking: Reported on 10/29/2021 09/05/21   Constant, Peggy, MD  nitrofurantoin, macrocrystal-monohydrate, (MACROBID) 100 MG capsule Take 1 capsule (100 mg total) by mouth 2 (two) times daily. Patient not taking: Reported on 10/29/2021 08/31/21   Constant, Peggy, MD  sertraline (ZOLOFT) 50 MG tablet Take 1 tablet (50 mg total) by mouth daily. Patient not taking: Reported on 08/31/2021 03/12/21   Myrtis Ser, CNM  triamcinolone ointment (KENALOG) 0.5 % Apply 1 application topically 2 (two) times daily. Patient not taking: Reported on 08/31/2021 07/03/21   Arnaldo Natal, MD       Allergies    Peach [prunus persica]    Review of Systems   Review of Systems Ten systems are reviewed and are negative for acute change except as noted in the HPI  Physical Exam Updated Vital Signs BP 111/74    Pulse 64    Temp 97.9 F (36.6 C) (Oral)    Resp 18    Ht 5' 3.5" (1.613 m)    Wt 85.3 kg    LMP 10/02/2021 (Exact Date) Comment: IUD   SpO2 100%    BMI 32.78 kg/m  Physical Exam Constitutional:      General: She is not in acute distress.    Appearance: Normal appearance. She is well-developed. She is not ill-appearing or diaphoretic.  HENT:     Head: Normocephalic and atraumatic.  Eyes:     General: Vision grossly intact. Gaze aligned appropriately.     Pupils: Pupils are equal, round, and reactive to light.  Neck:     Trachea: Trachea and phonation normal.  Pulmonary:     Effort: Pulmonary effort is normal. No respiratory distress.  Abdominal:     General: There is no distension.     Palpations: Abdomen is soft.     Tenderness: There is abdominal tenderness in the right upper quadrant. There is no guarding or rebound. Positive signs include Murphy's sign. Negative signs include McBurney's sign.  Musculoskeletal:        General: Normal range of motion.     Cervical back: Normal range of motion.  Skin:    General: Skin is warm and dry.  Neurological:     Mental Status: She is alert.     GCS: GCS eye subscore is 4. GCS verbal subscore is 5. GCS motor subscore is 6.     Comments: Speech is clear and goal oriented, follows commands Major Cranial nerves without deficit, no facial droop Moves extremities without ataxia, coordination intact  Psychiatric:        Behavior: Behavior normal.    ED Results / Procedures / Treatments   Labs (all labs ordered are listed, but only abnormal results are displayed) Labs Reviewed  RESP PANEL BY RT-PCR (FLU A&B, COVID) [id number]  URINALYSIS, ROUTINE W REFLEX MICROSCOPIC  POC URINE PREG, ED    EKG None  Radiology US Abdomen  Limited RUQ (LIVER/GB)  Result Date: 10/30/2021 CLINICAL DATA:  RIGHT upper quadrant pain for 2 days EXAM: ULTRASOUND ABDOMEN LIMITED RIGHT UPPER QUADRANT COMPARISON:  None FINDINGS: Gallbladder: Dependent calculi within gallbladder up to 11 mm diameter. Associated gallbladder wall thickening. No pericholecystic fluid or sonographic Murphy sign. Common bile duct: Diameter: 2 mm, normal Liver: Normal echogenicity without mass or nodularity. No intrahepatic biliary dilatation. Portal vein is patent on color Doppler imaging with normal direction of blood flow towards the liver. Other: No RIGHT upper quadrant free fluid. IMPRESSION: Cholelithiasis with associated gallbladder wall thickening, though no pericholecystic fluid or sonographic Murphy sign are identified to suggest acute cholecystitis. If there is persistent clinical concern for acute cholecystitis, consider radionuclide hepatobiliary imaging. Remainder of exam unremarkable. Electronically Signed   By: Lavonia Dana M.D.   On: 10/30/2021 11:08    Procedures Procedures    Medications Ordered in ED Medications  lactated ringers infusion (has no administration in time range)  chlorhexidine gluconate (MEDLINE KIT) (PERIDEX) 0.12 % solution 15 mL (has no administration in time range)  cefTRIAXone (ROCEPHIN) 2 g in sodium chloride 0.9 % 100 mL IVPB (2 g Intravenous New Bag/Given 10/30/21 1342)  sodium chloride 0.9 % bolus 1,000 mL (1,000 mLs Intravenous New Bag/Given 10/30/21 1343)    ED Course/ Medical Decision Making/ A&P Clinical Course as of 10/30/21 1426  Fri Oct 30, 2021  1201 [facility] [BM]    Clinical Course User Index [BM] [name], PA-C                           Medical Decision Making  Additional history obtained from: Nursing notes from this visit. Review of electronic medical records.  I independently reviewed labs from [facility] ER obtained just after midnight today include CBC which is within normal limits.   Lipase which is minimally elevated at 57.  CMP which shows slightly decreased bilirubin otherwise within normal limits.   Will obtain urinalysis and urine pregnancy test.  RUQ ultrasound ordered. ----------------------- Urine pregnancy test negative.  RUQ ultrasound:  IMPRESSION:  Cholelithiasis with associated gallbladder wall thickening, though  no pericholecystic fluid or sonographic Murphy sign are identified  to suggest acute cholecystitis.     If there is persistent clinical concern for acute cholecystitis,  consider radionuclide hepatobiliary imaging.     Remainder of exam unremarkable.   COVID/flu panel ordered.  Will place consult to general surgery, concern for acute cholecystitis, on my exam earlier positive Murphy sign.  Charge nurse informed, finding for patient. ---------------------------- I consulted with general surgery team and spoke with Melina Modena PA-C.  Advises they will be down to evaluate patient for  potential cholecystectomy as soon as available. --------------------------- COVID/influenza panel negative.  Urinalysis without evidence for infection.  I reevaluated the patient, she is resting comfortably bed no acute distress.  I discussed imaging results as well as laboratory results with the patient in detail, she stated understanding of findings.  She is agreeable to general surgery consultation.  Patient's case discussed with ER physician Dr. Kathrynn Humble during this visit who agrees with work-up and general surgery evaluation  Note: Portions of this report may have been transcribed using voice recognition software. Every effort was made to ensure accuracy; however, inadvertent computerized transcription errors may still be present.         Final Clinical Impression(s) / ED Diagnoses Final diagnoses:  RUQ abdominal pain  Cholecystitis    Rx / DC Orders ED Discharge Orders     None         [name], PA-C 10/30/21 [place], MD 10/31/21 [phone]

## 2021-10-30 NOTE — Anesthesia Preprocedure Evaluation (Addendum)
Anesthesia Evaluation  Patient identified by MRN, date of birth, ID band Patient awake    Reviewed: Allergy & Precautions, NPO status , Patient's Chart, lab work & pertinent test results  Airway Mallampati: II  TM Distance: >3 FB     Dental  (+) Dental Advisory Given   Pulmonary asthma , Patient abstained from smoking., former smoker,    breath sounds clear to auscultation       Cardiovascular negative cardio ROS   Rhythm:Regular Rate:Normal     Neuro/Psych PSYCHIATRIC DISORDERS Anxiety Depression negative neurological ROS     GI/Hepatic negative GI ROS, (+)     substance abuse  marijuana use,   Endo/Other  negative endocrine ROS  Renal/GU negative Renal ROS  negative genitourinary   Musculoskeletal negative musculoskeletal ROS (+)   Abdominal   Peds negative pediatric ROS (+)  Hematology  (+) 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), ,   Anesthesia Other Findings   Reproductive/Obstetrics negative OB ROS                            Anesthesia Physical Anesthesia Plan  ASA: 2  Anesthesia Plan: General   Post-op Pain Management: Minimal or no pain anticipated   Induction:   PONV Risk Score and Plan: 3 and Dexamethasone, Ondansetron, Midazolam and Treatment may vary due to age or medical condition  Airway Management Planned: Oral ETT  Additional Equipment:   Intra-op Plan:   Post-operative Plan: Extubation in OR  Informed Consent: I have reviewed the patients History and Physical, chart, labs and discussed the procedure including the risks, benefits and alternatives for the proposed anesthesia with the patient or authorized representative who has indicated his/her understanding and acceptance.     Dental advisory given  Plan Discussed with:   Anesthesia Plan Comments:         Anesthesia Quick Evaluation

## 2021-10-30 NOTE — ED Triage Notes (Signed)
Patient c/o RUQ abdominal pain and vomiting since yesterday. Patient went to [facility] yesterday for the same.

## 2021-10-30 NOTE — Plan of Care (Signed)

## 2021-10-30 NOTE — Progress Notes (Signed)
Received patient from PACU, alert and oriented x 4.  Abdominal lap sites (ports) x 4 CDI, open to air.  Assisted in bed in position of comfort.  Oriented to room and unit routine, call bell within reach.  Needs addressed.

## 2021-10-30 NOTE — Op Note (Signed)
Date: 10/30/21  Patient: Andrea Jenkins MRN: [id number]  Preoperative Diagnosis: Symptomatic cholelithiasis Postoperative Diagnosis: Acute calculous cholecystitis  Procedure: Laparoscopic cholecystectomy  Surgeon: Sophronia Simas, MD  EBL: Minimal  Anesthesia: General endotracheal  Specimens: Gallbladder  Indications: [name] is a 26 year old female who presented with 2 days of right upper quadrant abdominal pain.  She previously had less severe episodes of right upper quadrant pain during her recent pregnancy.  She presented to the ED and an ultrasound showed cholelithiasis and mild gallbladder wall thickening.  LFTs were normal.  Given her persistent pain, she was brought to the OR for laparoscopic cholecystectomy.  Findings: Gallstone within the cystic duct and gallbladder wall edema consistent with acute cholecystitis.  Procedure details: Informed consent was obtained in the preoperative area prior to the procedure. The patient was brought to the operating room and placed on the table in the supine position. General anesthesia was induced and appropriate lines and drains were placed for intraoperative monitoring. Perioperative antibiotics were administered per SCIP guidelines. The abdomen was prepped and draped in the usual sterile fashion. A pre-procedure timeout was taken verifying patient identity, surgical site and procedure to be performed.  A small infraumbilical skin incision was made, the subcutaneous tissue was divided with cautery, and the umbilical stalk was grasped and elevated. The fascia was incised and the peritoneal cavity was directly visualized. A 7mm Hassan trocar was placed. The peritoneal cavity was inspected with no evidence of visceral or vascular injury. Three 41mm ports were placed in the right subcostal margin, all under direct visualization. The fundus of the gallbladder was grasped and retracted cephalad. The infundibulum was retracted laterally.  The  gallbladder wall was edematous, consistent with acute cholecystitis.  The cystic triangle was dissected out using cautery and blunt dissection, and the critical view of safety was obtained.  There was a stone within the proximal cystic duct, and this was milked up into the gallbladder.  The cystic duct and cystic artery were clipped and ligated, leaving 2 clips behind on the cystic duct stump. The gallbladder was taken off the liver using cautery.  A posterior branch of the cystic artery was clipped prior to division.  The specimen was placed in an endocatch bag and removed. The surgical site was irrigated with saline until the effluent was clear. Hemostasis was achieved in the gallbladder fossa using cautery. The cystic duct and artery stumps were visually inspected and there was no evidence of bile leak or bleeding. The ports were removed under direct visualization and the abdomen was desufflated. The umbilical port site fascia was closed with a 0 vicryl suture. The skin at all port sites was closed with 4-0 monocryl subcuticular suture. Dermabond was applied.  The patient tolerated the procedure well with no apparent complications.  All counts were correct x2 at the end of the procedure. The patient was extubated and taken to PACU in stable condition.  Sophronia Simas, MD 10/30/21 5:11 PM

## 2021-10-30 NOTE — Transfer of Care (Signed)
Immediate Anesthesia Transfer of Care Note  Patient: [name]  Procedure(s) Performed: LAPAROSCOPIC CHOLECYSTECTOMY  Patient Location: PACU  Anesthesia Type:General  Level of Consciousness: awake and pateint uncooperative  Airway & Oxygen Therapy: Patient Spontanous Breathing and Patient connected to face mask  Post-op Assessment: Report given to RN and Post -op Vital signs reviewed and stable  Post vital signs: Reviewed and stable  Last Vitals:  Vitals Value Taken Time  BP 118/93 10/30/21 1630  Temp    Pulse 90 10/30/21 1639  Resp 28 10/30/21 1630  SpO2 98 % 10/30/21 1639  Vitals shown include unvalidated device data.  Last Pain:  Vitals:   10/30/21 1034  TempSrc:   PainSc: 7          Complications: No notable events documented.

## 2021-10-30 NOTE — H&P (Addendum)
Andrea Jenkins 03-10-1996  WE:[id number].    Requesting MD: Kathrynn Humble, MD Chief Complaint/Reason for Consult: RUQ pain  HPI:  Andrea Jenkins is a 26 year old female with a past medical history of anxiety, depression, and 3 vaginal deliveries.  She presents with a chief complaint of right upper quadrant pain.  States the pain started yesterday evening around 8:30 PM.  Described as stabbing constant right upper quadrant pain with radiation around to her back.  Not relieved by ibuprofen. Associated with nausea and vomiting.  She presented to [facility] last night where CBC, CMP and lipase were unremarkable.  She represents [facility] to [facility] with persistent right upper quadrant pain and an episode of vomiting this morning.  She reports similar right upper quadrant pain and vomiting during her most recent pregnancy.  At the time she attributed the symptoms to pregnancy, she did not know she had gallstones.  The patient reports vaping.  Denies cigarette use.  Reports occasional use of alcohol, not daily.  Denies other drug use.  Currently lives at home with her fianc and 3 children.  No known drug allergies.  Denies use of blood thinning medications.  Denies a history of abdominal surgery. States her last meal was tacos at 3:30 PM yesterday.   Right upper quadrant ultrasound revealed cholelithiasis with wall thickening.  General surgery has been asked to evaluate.  ROS: Review of Systems  Constitutional:  Positive for chills.  HENT: Negative.    Eyes: Negative.   Respiratory: Negative.    Cardiovascular: Negative.   Gastrointestinal:  Positive for abdominal pain, nausea and vomiting. Negative for blood in stool, constipation, diarrhea and melena.  Genitourinary: Negative.   Musculoskeletal: Negative.   Skin: Negative.   Neurological: Negative.   Endo/Heme/Allergies: Negative.   Psychiatric/Behavioral:  Positive for depression.    Family History  Problem Relation Age of  Onset   Hypertension Mother    Miscarriages / [place] Mother    Hypertension Maternal Grandmother    Asthma Maternal Grandmother    Diabetes Maternal Grandmother    Vision loss Maternal Grandmother    Mental illness Brother    Cancer Maternal Aunt    Mental illness Sister    Heart disease Neg Hx     Past Medical History:  Diagnosis Date   Anxiety    Asthma    pt states she doesn't use inhaler much (04/10/19)   Depression    Gonorrhea    Heart murmur    when younger   Vaginal Pap smear, abnormal     Past Surgical History:  Procedure Laterality Date   CATARACT EXTRACTION     WISDOM TOOTH EXTRACTION     WRIST SURGERY      Social History:  reports that she quit smoking about 4 years ago. Her smoking use included cigars and cigarettes. She smoked an average of .25 packs per day. She has never used smokeless tobacco. She reports current alcohol use. She reports current drug use. Drug: Marijuana.  Allergies:  Allergies  Allergen Reactions   Peach [Prunus Persica] Hives    (Not in a hospital admission)    Physical Exam: Blood pressure 111/74, pulse 64, temperature 97.9 F (36.6 C), temperature source Oral, resp. rate 18, height 5' 3.5" (1.613 m), weight 85.3 kg, last menstrual period 10/02/2021, SpO2 100 %, currently breastfeeding. General: Pleasant female appears stated age, NAD. HEENT: head -normocephalic, atraumatic; Eyes: PERRLA, no conjunctival injection, anicteric sclerae, Throat: pink mucosa, uvula midline, no exudates.  Neck- Trachea is midline, no thyromegaly  CV- RRR, normal S1/S2, no M/R/G, radial pulses 2+ BL Pulm- breathing is non-labored. CTABL, no wheezes, rhales, rhonchi. Abd- soft, abdominal striae present, non-distended, very TTP RUQ without guarding, negative murphy's, appropriate bowel sounds in 4 quadrants, no masses, hernias, or organomegaly. GU- deferred  MSK- UE/LE symmetrical, no cyanosis, clubbing, or edema. Neuro- CN II-XII grossly in tact,  no paresthesias. Psych- Alert and Oriented x3 with appropriate affect Skin: warm and dry, no rashes or lesions   Results for orders placed or performed during the hospital encounter of 10/30/21 (from the past 48 hour(s))  POC Urine Pregnancy, ED (not at [facility])     Status: None   Collection Time: 10/30/21 11:02 AM  Result Value Ref Range   Preg Test, Ur NEGATIVE NEGATIVE    Comment:        THE SENSITIVITY OF THIS METHODOLOGY IS >24 mIU/mL   Urinalysis, Routine w reflex microscopic     Status: None   Collection Time: 10/30/21 11:20 AM  Result Value Ref Range   Color, Urine YELLOW YELLOW   APPearance CLEAR CLEAR   Specific Gravity, Urine 1.026 1.005 - 1.030   pH 5.0 5.0 - 8.0   Glucose, UA NEGATIVE NEGATIVE mg/dL   Hgb urine dipstick NEGATIVE NEGATIVE   Bilirubin Urine NEGATIVE NEGATIVE   Ketones, ur NEGATIVE NEGATIVE mg/dL   Protein, ur NEGATIVE NEGATIVE mg/dL   Nitrite NEGATIVE NEGATIVE   Leukocytes,Ua NEGATIVE NEGATIVE    Comment: Performed at [facility], [place] [address] [place], [place] [address]   US Abdomen Limited RUQ (LIVER/GB)  Result Date: 10/30/2021 CLINICAL DATA:  RIGHT upper quadrant pain for 2 days EXAM: ULTRASOUND ABDOMEN LIMITED RIGHT UPPER QUADRANT COMPARISON:  None FINDINGS: Gallbladder: Dependent calculi within gallbladder up to 11 mm diameter. Associated gallbladder wall thickening. No pericholecystic fluid or sonographic Murphy sign. Common bile duct: Diameter: 2 mm, normal Liver: Normal echogenicity without mass or nodularity. No intrahepatic biliary dilatation. Portal vein is patent on color Doppler imaging with normal direction of blood flow towards the liver. Other: No RIGHT upper quadrant free fluid. IMPRESSION: Cholelithiasis with associated gallbladder wall thickening, though no pericholecystic fluid or sonographic Murphy sign are identified to suggest acute cholecystitis. If there is persistent clinical concern for acute cholecystitis,  consider radionuclide hepatobiliary imaging. Remainder of exam unremarkable. Electronically Signed   By: Lavonia Dana M.D.   On: 10/30/2021 11:08      Assessment/Plan Symptomatic cholelithiasis, likely cholecystitis  26 year old, otherwise healthy female with worsening biliary colic who presents with acute onset right upper quadrant pain that started yesterday associated with nausea and vomiting.  Right upper quadrant pain persists today.  Recommend laparoscopic cholecystectomy.  Pregnancy test was negative.  COVID test pending.  Received 1 dose of Rocephin.  Possible discharge home from PACU versus staying overnight for observation.   Patient's person of contact is her fianc Bradly Chris [phone]   FEN - none  VTE - SCDs ID - Rocephin Admit - [facility] observation [place], [facility] Surgery 10/30/2021, 2:11 PM Please see Amion for pager number during day hours 7:00am-4:30pm or 7:00am -11:30am on weekends

## 2021-10-31 MED ORDER — TRAMADOL HCL 50 MG PO TABS: 50.0000 mg | ORAL_TABLET | Freq: Four times a day (QID) | ORAL | 0 refills | Status: DC | PRN

## 2021-10-31 NOTE — Discharge Summary (Signed)
Physician Discharge Summary   Patient ID: Andrea Jenkins MRN: [id number] DOB/AGE: [birth year] 26 y.o.  Admit date: 10/30/2021  Discharge date: 10/31/2021  Discharge Diagnoses:  Principal Problem:   Acute cholecystitis   Discharged Condition: good  Hospital Course: Patient was admitted for observation following gallbladder surgery.  Post op course was uncomplicated.  Pain was well controlled.  Tolerated diet.  Patient was prepared for discharge home on POD#1.  Consults: None  Treatments: surgery: lap cholecystectomy - Dr. Freida Busman  Discharge Exam: Blood pressure (!) 113/59, pulse (!) 55, temperature 98.2 F (36.8 C), temperature source Oral, resp. rate 18, height 5' 3.5" (1.613 m), weight 85.3 kg, last menstrual period 10/02/2021, SpO2 100 %, currently breastfeeding. HEENT - clear Neck - soft Abd - soft without distension; wounds dry and intact with Dermabond  Disposition: Home  Discharge Instructions     Diet - low sodium heart healthy   Complete by: As directed    Discharge instructions   Complete by: As directed    [facility]  LAPAROSCOPIC SURGERY:  POST-OP INSTRUCTIONS  Always review your discharge instruction sheet given to you by the facility where your surgery was performed.  A prescription for pain medication may be given to you upon discharge.  Take your pain medication as prescribed.  If narcotic pain medicine is not needed, then you may take acetaminophen (Tylenol) or ibuprofen (Advil) as needed.  Take your usually prescribed medications unless otherwise directed.  If you need a refill on your pain medication, please contact your pharmacy.  They will contact our office to request authorization. Prescriptions will not be filled after 5 P.M. or on weekends.  You should follow a light diet the first few days after arrival home, such as soup and crackers or toast.  Be sure to include plenty of fluids daily.  Most patients will experience  some swelling and bruising in the area of the incisions.  Ice packs will help.  Swelling and bruising can take several days to resolve.   It is common to experience some constipation after surgery.  Increasing fluid intake and taking a stool softener (such as Colace) will usually help or prevent this problem from occurring.  A mild laxative (Milk of Magnesia or Miralax) should be taken according to package instructions if there has been no bowel movement after 48 hours.  You will likely have Dermabond (topical glue) over your incisions.  This seals the incisions and allows you to bathe and shower at any time after your surgery.  Glue should remain in place for up to 10 days.  It may be removed after 10 days by pealing off the Dermabond material or using Vaseline or naval jelly to remove.  If you have steri-strips over your incisions, you may remove the gauze bandage on the second day after surgery, and you may shower at that time.  Leave your steri-strips (small skin tapes) in place directly over the incision.  These strips should remain on the skin for 5-7 days and then be removed.  You may get them wet in the shower and pat them dry.  Any sutures or staples will be removed at the office during your follow-up visit.  ACTIVITIES:  You may resume regular (light) daily activities beginning the next day - such as daily self-care, walking, climbing stairs - gradually increasing activities as tolerated.  You may have sexual intercourse when it is comfortable.  Refrain from any heavy lifting or straining until approved by  your doctor.  You may drive when you are no longer taking prescription pain medication, when you can comfortably wear a seatbelt, and when you can safely maneuver your car and apply brakes.  You should see your doctor in the office for a follow-up appointment approximately 2-3 weeks after your surgery.  Make sure that you call for this appointment within a day or two after you arrive home  to insure a convenient appointment time.  WHEN TO CALL YOUR DOCTOR: Fever over 101.0 Inability to urinate Continued bleeding from incision Increased pain, redness, or drainage from the incision Increasing abdominal pain  The clinic staff is available to answer your questions during regular business hours.  Please don't hesitate to call and ask to speak to one of the nurses for clinical concerns.  If you have a medical emergency, go to the nearest emergency room or call 911.  A surgeon from [facility] is always on call for the hospital.  Darnell Level, MD [facility] Office: [phone] Toll Free:  [phone] FAX [phone]  Website: [email or web address]   Increase activity slowly   Complete by: As directed    No dressing needed   Complete by: As directed       Allergies as of 10/31/2021       Reactions   Peach [prunus Persica] Hives        Medication List     TAKE these medications    traMADol 50 MG tablet Commonly known as: ULTRAM Take 1-2 tablets (50-100 mg total) by mouth every 6 (six) hours as needed.               Discharge Care Instructions  (From admission, onward)           Start     Ordered   10/31/21 0000  No dressing needed        10/31/21 7591            Follow-up Information     [facility] [place] Follow up in 3 week(s).   Specialty: General Surgery Why: our office is working on your follow up appointment.  Please arrive 30 minutes prior to you appointment for paperwork and check in process Contact information: [address] [place] [address] [phone]                 Darnell Level, MD [facility] Office: [phone]   Signed: Darnell Level 10/31/2021, 8:34 AM

## 2021-10-31 NOTE — Progress Notes (Signed)
Order for capnography placed. Pt stated she is afraid to go to sleep after having a rough recovery from surgery today. She requested to be on a monitor, but due to this is a non tele unit she would have to be moved. Pt agreed to continuous capnography for comfort.

## 2021-10-31 NOTE — Progress Notes (Signed)
°  Transition of Care (TOC) Screening Note   Patient Details  Name: Andrea Jenkins Date of Birth: May 26, 1996   Transition of Care [facility]) CM/SW Contact:    Illene Regulus, LCSW Phone Number: 10/31/2021, 10:43 AM    Transition of Care Department [facility]) has reviewed patient and no TOC needs have been identified at this time. We will continue to monitor patient advancement through interdisciplinary progression rounds. If new patient transition needs arise, please place a TOC consult.

## 2021-10-31 NOTE — Progress Notes (Signed)
Discharge instructions given and explained to patient in detail. Patient verbalizes understanding. Escorted to lobby in wheelchair to be discharged home with family

## 2021-10-31 NOTE — Progress Notes (Signed)
Pt HR in the mid 40s-50s while sleeping this shift. HR increased to the low 60s while awake.

## 2021-11-01 ENCOUNTER — Encounter (HOSPITAL_COMMUNITY): Payer: Self-pay | Admitting: Surgery

## 2021-11-02 NOTE — Anesthesia Postprocedure Evaluation (Signed)
Anesthesia Post Note  Patient: Andrea Jenkins  Procedure(s) Performed: LAPAROSCOPIC CHOLECYSTECTOMY     Patient location during evaluation: PACU Anesthesia Type: General Level of consciousness: awake and alert Pain management: pain level controlled Vital Signs Assessment: post-procedure vital signs reviewed and stable Respiratory status: spontaneous breathing, nonlabored ventilation, respiratory function stable and patient connected to nasal cannula oxygen Cardiovascular status: blood pressure returned to baseline and stable Postop Assessment: no apparent nausea or vomiting Anesthetic complications: no   No notable events documented.  Last Vitals:  Vitals:   10/31/21 0603 10/31/21 1000  BP: (!) 113/59 124/64  Pulse: (!) 55 60  Resp: 18 18  Temp: 36.8 C 37 C  SpO2: 100% 100%    Last Pain:  Vitals:   10/31/21 1000  TempSrc: Oral  PainSc:                  Kennieth Rad

## 2021-11-03 LAB — SURGICAL PATHOLOGY

## 2021-12-09 ENCOUNTER — Other Ambulatory Visit: Payer: Self-pay | Admitting: *Deleted

## 2021-12-09 NOTE — Patient Instructions (Signed)
Visit Information  Ms. Jerlyn Ly Commins  - as a part of your Medicaid benefit, you are eligible for care management and care coordination services at no cost or copay. I was unable to reach you by phone today but would be happy to help you with your health related needs. Please feel free to call me @ [phone].   A member of the Managed Medicaid care management team will reach out to you again over the next 14 days.   Lurena Joiner RN, BSN [place] RN Care Coordinator

## 2021-12-09 NOTE — Patient Outreach (Signed)
Care Coordination  12/09/2021  MONET CONDRA 1995-12-22 [id number]   Medicaid Managed Care   Unsuccessful Outreach Note  12/09/2021 Name: ILEANA VIGLIOTTI MRN: [id number] DOB: 1996-03-19  Referred by: Patient, No Pcp Per (Inactive) Reason for referral : High Risk Managed Medicaid (Unsuccessful RNCM initial outreach)   An unsuccessful telephone outreach was attempted today. The patient was referred to the case management team for assistance with care management and care coordination.   Follow Up Plan: The care management team will reach out to the patient again over the next 14 days.   Lurena Joiner RN, BSN [place] RN Care Coordinator

## 2021-12-18 ENCOUNTER — Other Ambulatory Visit: Payer: Self-pay | Admitting: *Deleted

## 2021-12-18 ENCOUNTER — Telehealth: Payer: Self-pay | Admitting: *Deleted

## 2021-12-18 ENCOUNTER — Other Ambulatory Visit: Payer: Self-pay

## 2021-12-18 DIAGNOSIS — Z5941 Food insecurity: Secondary | ICD-10-CM

## 2021-12-18 NOTE — Patient Outreach (Addendum)
Medicaid Managed Care   Nurse Care Manager Note  12/18/2021 Name:  Andrea Jenkins MRN:  [id number] DOB:  May 11, 1996  Andrea Jenkins is an 26 y.o. year old female who is a primary Andrea Jenkins of Andrea Jenkins, No Pcp Per (Inactive).  The [facility] Managed Care Coordination team was consulted for assistance with:    Anxiety Depression Health Maintenance  [name] was given information about Medicaid Managed Care Coordination team services today. Andrea Jenkins [place] Andrea Jenkins agreed to services and verbal consent obtained.  Engaged with Andrea Jenkins by telephone for initial visit in response to provider referral for case management and/or care coordination services.   Assessments/Interventions:  Review of past medical history, allergies, medications, health status, including review of consultants reports, laboratory and other test data, was performed as part of comprehensive evaluation and provision of chronic care management services.  SDOH (Social Determinants of Health) assessments and interventions performed: SDOH Interventions    Flowsheet Row Most Recent Value  SDOH Interventions   Food Insecurity Interventions Other (Comment)  [Care Guide referral for food pantries]  Housing Interventions Intervention Not Indicated  Transportation Interventions Intervention Not Indicated       Care Plan  Allergies  Allergen Reactions   Peach [Prunus Persica] Hives    Medications Reviewed Today     Reviewed by Heidi Dach, RN (Registered Nurse) on 12/18/21 at [phone]  Med List Status: <None>   Medication Order Taking? Sig Documenting Provider Last Dose Status Informant  sertraline (ZOLOFT) 100 MG tablet [id number] Yes Take 100 mg by mouth daily. [provider] Taking Active   traMADol (ULTRAM) 50 MG tablet [id number] No Take 1-2 tablets (50-100 mg total) by mouth every 6 (six) hours as needed.  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    Conditions to be addressed/monitored per PCP order:  Anxiety, Depression, and Health Maintenance  Care Plan : RN Care Manager Plan of Care  Updates made by Heidi Dach, RN since 12/18/2021 12:00 AM     Problem: Health Management Needs related to Health Maintenance, Anxiety and Depression      Long-Range Goal: Development of Plan of Care to address Health Management Needs related to Health Maintenance, Anxiety and Depression   Start Date: 12/18/2021  Expected End Date: 02/16/2022  Priority: High  Note:   Current Barriers:  Knowledge Deficits related to plan of care for management of Anxiety with Panic Symptoms, and Depression: depressed mood decreased appetite and Health Maintenance -Andrea Jenkins has three young children ages 51yr, 32yr, and 9 months. She does not have a PCP. She was being treated for anxiety and depression by her OB/GYN during pregnancy. She needs to establish care with a PCP for continued care.   RNCM Clinical Goal(s):  Andrea Jenkins will verbalize understanding of plan for management of Anxiety, Depression, and Health Maintenance as evidenced by verbalization of self monitoring activities take all medications exactly as prescribed and will call provider for medication related questions as evidenced by documentation in EMR    work with social worker to address Mental Health Concerns  related to the management of Anxiety and Depression as evidenced by review of EMR and Andrea Jenkins or social worker report  through collaboration with RN Care manager, provider, and care team.  Work with Care Guide for food insecurity and needing a dental provider  Interventions: Inter-disciplinary care team collaboration (see longitudinal plan of care) Evaluation of current treatment plan related to  self  management and Andrea Jenkins's adherence to plan as established by provider   Health Maintenance  (Status: New goal.) Long Term Goal  Evaluation of current treatment plan related to Anxiety, Depression, and Health Maintenance , Limited access to food self-management and Andrea Jenkins's adherence to plan as established by provider. Discussed plans with Andrea Jenkins for ongoing care management follow up and provided Andrea Jenkins with direct contact information for care management team Advised Andrea Jenkins to call [phone] for a provider accepting new patients, discussed the importance of having a PCP; Provided education to Andrea Jenkins re: diet and managing anxiety; Reviewed medications with Andrea Jenkins and discussed the importance of taking Zoloft every day, discussed setting an alarm; Care Guide referral for a list of local food pantries and Dental providers accepting Medicaid; Social Work referral for Managing Anxiety and Depression; Discussed the importance of eating 3 meals a day, exercising regularly and increasing her nighttime sleep  Andrea Jenkins Goals/Self-Care Activities: Take medications as prescribed   Attend church or other social activities Work with the Child psychotherapist to address care coordination needs and will continue to work with the clinical team to address health care and disease management related needs call 1-800-273-TALK (toll free, 24 hour hotline) go to [facility] [address], [place] [phone]) call 911 if experiencing a Mental Health or Behavioral Health Crisis        Follow Up:  Andrea Jenkins agrees to Care Plan and Follow-up.  Plan: The Managed Medicaid care management team will reach out to the Andrea Jenkins again over the next 30 days.  Date/time of next scheduled RN care management/care coordination outreach:  01/18/22 @ 11:15am  Estanislado Emms RN, BSN [place]   [facility]

## 2021-12-18 NOTE — Patient Instructions (Signed)
Visit Information  Andrea Jenkins was given information about Medicaid Managed Care team care coordination services as a part of their Healthy [facility] Medicaid benefit. Andrea Jenkins verbally consented to engagement with the [facility] Managed Care team.   If you are experiencing a medical emergency, please call 911 or report to your local emergency department or urgent care.   If you have a non-emergency medical problem during routine business hours, please contact your provider's office and ask to speak with a nurse.   For questions related to your Healthy [facility] health plan, please call: [phone] or visit the homepage here: [email or web address]  If you would like to schedule transportation through your Healthy [facility] plan, please call the following number at least 2 days in advance of your appointment: [phone]  For information about your ride after you set it up, call Ride Assist at [phone]. Use this number to activate a Will Call pickup, or if your transportation is late for a scheduled pickup. Use this number, too, if you need to make a change or cancel a previously scheduled reservation.  If you need transportation services right away, call [phone]. The after-hours call center is staffed 24 hours to handle ride assistance and urgent reservation requests (including discharges) 365 days a year. Urgent trips include sick visits, hospital discharge requests and life-sustaining treatment.  Call the [facility] Line at [phone], at any time, 24 hours a day, 7 days a week. If you are in danger or need immediate medical attention call 911.  If you would like help to quit smoking, call 1-800-QUIT-NOW (819 867 3247) OR Espaol: 1-855-Djelo-Ya (8-937-342-8768) o para ms informacin haga clic aqu or Text READY to 115-726 to register via text  Andrea Jenkins,   Please see education materials related to diet and managing  anxiety provided by MyChart link.  Patient verbalizes understanding of instructions and care plan provided today and agrees to view in MyChart. Active MyChart status confirmed with patient.    Telephone follow up appointment with Managed Medicaid care management team member scheduled for:01/18/22 @ 11:15am  Estanislado Emms RN, BSN [place]   [facility] RN Care Coordinator   Following is a copy of your plan of care:  Care Plan : RN Care Manager Plan of Care  Updates made by Heidi Dach, RN since 12/18/2021 12:00 AM     Problem: Health Management Needs related to Health Maintenance, Anxiety and Depression      Long-Range Goal: Development of Plan of Care to address Health Management Needs related to Health Maintenance, Anxiety and Depression   Start Date: 12/18/2021  Expected End Date: 02/16/2022  Priority: High  Note:   Current Barriers:  Knowledge Deficits related to plan of care for management of Anxiety with Panic Symptoms, and Depression: depressed mood decreased appetite and Health Maintenance   RNCM Clinical Goal(s):  Patient will verbalize understanding of plan for management of Anxiety, Depression, and Health Maintenance as evidenced by verbalization of self monitoring activities take all medications exactly as prescribed and will call provider for medication related questions as evidenced by documentation in EMR    work with social worker to address Mental Health Concerns  related to the management of Anxiety and Depression as evidenced by review of EMR and patient or social worker report     through collaboration with Medical illustrator, provider, and care team.  Work with Care Guide for food insecurity and needing a dental provider  Interventions: Inter-disciplinary care team collaboration (see  longitudinal plan of care) Evaluation of current treatment plan related to  self management and patient's adherence to plan as established by provider   Health  Maintenance  (Status: New goal.) Long Term Goal  Evaluation of current treatment plan related to Anxiety, Depression, and Health Maintenance , Limited access to food self-management and patient's adherence to plan as established by provider. Discussed plans with patient for ongoing care management follow up and provided patient with direct contact information for care management team Advised patient to call [phone] for a provider accepting new patients, discussed the importance of having a PCP; Provided education to patient re: diet and managing anxiety; Reviewed medications with patient and discussed the importance of taking Zoloft every day, discussed setting an alarm; Care Guide referral for a list of local food pantries and Dental providers accepting Medicaid; Social Work referral for Managing Anxiety and Depression; Discussed the importance of eating 3 meals a day, exercising regularly and increasing her nighttime sleep  Patient Goals/Self-Care Activities: Take medications as prescribed   Attend church or other social activities Work with the social worker to address care coordination needs and will continue to work with the clinical team to address health care and disease management related needs call 1-800-273-TALK (toll free, 24 hour hotline) go to [facility] [address], [place] [phone]) call 911 if experiencing a Mental Health or Behavioral Health Crisis

## 2021-12-18 NOTE — Telephone Encounter (Signed)
° °  Telephone encounter was:  Successful.  12/18/2021 Name: Andrea Jenkins MRN: [id number] DOB: Oct 29, 1995  Andrea Jenkins is a 27 y.o. year old female who is a primary care patient of Patient, No Pcp Per (Inactive) . The community resource team was consulted for assistance with Patient needs food and other things for her children provided insurance benefit information, food banks and [place] [facility] referral to patient   Care guide performed the following interventions: Patient provided with information about care guide support team and interviewed to confirm resource needs Follow up call placed to community resources to determine status of patients referral.  Follow Up Plan:  No further follow up planned at this time. The patient has been provided with needed resources.  Alois Cliche -[facility] Guide , Embedded Care Coordination [facility], Care Management  [phone] [address] [place] , [place] [address] Email : [email or web address]

## 2021-12-21 ENCOUNTER — Telehealth: Payer: Self-pay | Admitting: *Deleted

## 2021-12-21 NOTE — Telephone Encounter (Signed)
° °  Telephone encounter was:   Patient received requested information via email   Andrea Jenkins -[facility] Guide , Embedded Care Coordination [facility], Care Management  [phone] [address] [place] , [place] [address] Email : [email or web address]

## 2021-12-23 NOTE — Telephone Encounter (Signed)
Scheduled with SW on 12/25/21 ? ? ?Andrea Jenkins  ?Care Guide, Embedded Care Coordination  High Risk Medicaid Managed Care  ?[facility]  Care Management [facility]  ?Direct Dial: [phone]   ?

## 2021-12-25 ENCOUNTER — Other Ambulatory Visit: Payer: Self-pay | Admitting: *Deleted

## 2021-12-26 ENCOUNTER — Encounter: Payer: Self-pay | Admitting: *Deleted

## 2021-12-26 NOTE — Patient Outreach (Signed)
Care Management ?Clinical Social Work Note ? ?12/26/2021 ?Name: Andrea Jenkins MRN: [id number] DOB: 1996/05/17 ? ?Andrea Jenkins is a 26 y.o. year old female who is a primary care patient of Patient, No Pcp Per (Inactive).  The Care Management team was consulted for assistance with chronic disease management and coordination needs. ? ?Engaged with patient by telephone for initial visit in response to provider referral for social work chronic care management and care coordination services ? ?Consent to Services:  ?Andrea Jenkins was given information about Care Management services today including:  ?Care Management services includes personalized support from designated clinical staff supervised by her physician, including individualized plan of care and coordination with other care providers ?24/7 contact phone numbers for assistance for urgent and routine care needs. ?The patient may stop case management services at any time by phone call to the office staff. ? ?Patient agreed to services and consent obtained.  ? ?Assessment: Review of patient past medical history, allergies, medications, and health status, including review of relevant consultants reports was performed today as part of a comprehensive evaluation and provision of chronic care management and care coordination services. ? ?SDOH (Social Determinants of Health) assessments and interventions performed:  ?SDOH Interventions   ? ?Flowsheet Row Most Recent Value  ?SDOH Interventions   ?Food Insecurity Interventions [id number] Referral, Assist with SNAP Application, Stage manager ([facility]), Other (Comment)  [Community Care Guide Assistance]  ?Financial Strain Interventions Other (Comment)  [Care Guide Assistance]  ?Housing Interventions Intervention Not Indicated  ?Intimate Partner Violence Interventions Intervention Not Indicated  ?Physical Activity Interventions Patient Refused  ?Stress Interventions Offered [employer], Other  (Comment)  [Offered Counseling - Patient Refused]  ?Social Connections Interventions Intervention Not Indicated  ?Transportation Interventions Intervention Not Indicated  ?Depression Interventions/Treatment  Patient refuses Treatment  [Only requesting list of resources]  ? ?  ?  ? ?Advanced Directives Status: See Care Plan for related entries. ? ?Care Plan ? ?Allergies  ?Allergen Reactions  ? Peach [Prunus Persica] Hives  ? ? ?Outpatient Encounter Medications as of 12/25/2021  ?Medication Sig  ? sertraline (ZOLOFT) 100 MG tablet Take 100 mg by mouth daily.  ? traMADol (ULTRAM) 50 MG tablet Take 1-2 tablets (50-100 mg total) by mouth every 6 (six) hours as needed. (Patient not taking: Reported on 12/18/2021)  ? ?No facility-administered encounter medications on file as of 12/25/2021.  ? ? ?Patient Active Problem List  ? Diagnosis Date Noted  ? Acute cholecystitis 10/30/2021  ? Anxiety   ? Alpha thalassemia silent carrier 10/24/2019  ? 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 ? ?Conditions to be addressed/monitored: Anxiety and Caregiver Stress.  Corporate treasurer, Limited Social Support, Mental Health Concerns, and Lacks Knowledge of [facility]. ? ?Care Plan : LCSW Plan of Care  ?Updates made by Karolee Stamps, LCSW since 12/26/2021 12:00 AM  ?  ? ?Problem: Manage My Emotions and Reduce Symptoms of Anxiety.   ?Priority: High  ?  ? ?Goal: Manage My Emotions and Reduce Symptoms of Anxiety.   ?Start Date: 12/25/2021  ?Expected End Date: 02/24/2022  ?This Visit's Progress: On track  ?Priority: High  ?Note:   ?Current Barriers:   ?Acute Mental Health Needs related to Anxiety, Financial Insecurities, and Caregiver Stress, requires Support, Education, Resources, Referrals, Advocacy, and Care Coordination, in order to meet Unmet Acute Mental Health Needs. ?Clinical Goal(s):  ?Patient will work with LCSW, to reduce and manage symptoms of Anxiety, Financial Insecurities, and Caregiver  Stress, until well-controlled, or  established with a community mental health provider.     ?Patient will increase knowledge and/or ability of:  ?      Coping Skills, Healthy Habits, Self-Management Skills, Stress Reduction, Home Safety and Utilizing [employer] and Resources.   ?Interventions: ?Inter-disciplinary care team collaboration (see longitudinal plan of care). ?Clinical Interventions:  ?Assessed patient's previous treatment, needs, coping skills, current treatment, support system, and barriers to care. ?PHQ-2 and PHQ-9 Depression Screening Tool performed, and results reviewed with patient. ?Mindfulness Meditation Strategies, Relaxation Techniques, and Deep Breathing Exercises taught, and encouraged daily. ?Solution-Focused Therapy performed. ?Emotional Support provided, and Verbalization of Feelings encouraged. ?Problem Solving Solutions developed. ?Brief Cognitive Behavioral Therapy initiated. ?Quality of Sleep assessed, and Sleep Hygiene Techniques promoted. ?Support Group Participation encouraged. ?Increase Level of Activity/Exercise emphasized.    ?Discussed plans with patient for ongoing care management follow-up, and provided patient with direct contact information for care management team. ?Discussed several options for long-term counseling, based on need and insurance through [facility]. ?E-mailed List of Medicaid Approved Psychiatrists and Therapists, for independent review, as well as further discussion during next scheduled telephone outreach call.     ?Patient Goals/Self-Care Activities: ?Begin personal counseling with LCSW, on a bi-weekly basis, to reduce and manage symptoms of Anxiety, Financial Insecurities, and Caregiver Stress, until resolved, or until established with a community mental health provider. ?Continue to work with [employer], in an effort to obtain nutritional services and financial assistance. ?Incorporate into daily practice - relaxation techniques, deep breathing exercises, and  mindfulness meditation strategies. ?Review List of Medicaid Approved Psychiatrists and Therapists, and begin contacting agencies and providers of interest. ?Contact LCSW directly (# [id number]), if you have questions, need assistance, or if additional social work needs are identified between now and our next scheduled telephone outreach call.  ?Follow-Up:  01/08/2022 at 10:30 am ? ? ? ?  ?Danford Bad, BSW, MSW, LCSW  ?Licensed Clinical Social Worker  ?[employer] Customer service manager Care Management ?[facility]  ?Mailing Address-[address], [place], [place] [address] ?Physical Address-[address] [place], [place], [place] [address] ?Toll Free Main # [phone] ?Fax # [phone] ?Cell # [phone] ?[email or web address] ? ? ? ? ? ?  ?

## 2022-01-08 ENCOUNTER — Encounter: Payer: Self-pay | Admitting: *Deleted

## 2022-01-08 ENCOUNTER — Other Ambulatory Visit: Payer: Self-pay | Admitting: *Deleted

## 2022-01-08 NOTE — Patient Outreach (Signed)
Care Management ?Clinical Social Work Note ? ?01/08/2022 ?Name: Andrea Jenkins MRN: [id number] DOB: [birth year] ? ?Andrea Jenkins is a 26 y.o. year old female who is a primary care patient of Patient, No Pcp Per (Inactive).  The Care Management team was consulted for assistance with chronic disease management and coordination needs. ? ?Engaged with patient by telephone for follow up visit in response to provider referral for social work chronic care management and care coordination services ? ?Consent to Services:  ?[name] was given information about Care Management services today including:  ?Care Management services includes personalized support from designated clinical staff supervised by her physician, including individualized plan of care and coordination with other care providers ?24/7 contact phone numbers for assistance for urgent and routine care needs. ?The patient may stop case management services at any time by phone call to the office staff. ? ?Patient agreed to services and consent obtained.  ? ?Assessment: Review of patient past medical history, allergies, medications, and health status, including review of relevant consultants reports was performed today as part of a comprehensive evaluation and provision of chronic care management and care coordination services. ? ?SDOH (Social Determinants of Health) assessments and interventions performed:   ? ?Advanced Directives Status: Not addressed in this encounter. ? ?Care Plan ? ?Allergies  ?Allergen Reactions  ? Peach [Prunus Persica] Hives  ? ? ?Outpatient Encounter Medications as of 01/08/2022  ?Medication Sig  ? sertraline (ZOLOFT) 100 MG tablet Take 100 mg by mouth daily.  ? traMADol (ULTRAM) 50 MG tablet Take 1-2 tablets (50-100 mg total) by mouth every 6 (six) hours as needed. (Patient not taking: Reported on 12/18/2021)  ? ?No facility-administered encounter medications on file as of 01/08/2022.  ? ? ?Patient Active Problem List  ? Diagnosis  Date Noted  ? Acute cholecystitis 10/30/2021  ? Anxiety   ? Alpha thalassemia silent carrier 10/24/2019  ? 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 ? ?Conditions to be addressed/monitored: Anxiety.  Mental Health Concerns and Lacks Knowledge of [facility]. ? ?Care Plan : LCSW Plan of Care  ?Updates made by Karolee Stamps, LCSW since 01/08/2022 12:00 AM  ?  ? ?Problem: Manage My Emotions and Reduce Symptoms of Anxiety. Resolved 01/08/2022  ?Priority: High  ?  ? ?Goal: Manage My Emotions and Reduce Symptoms of Anxiety. Completed 01/08/2022  ?Start Date: 12/25/2021  ?Expected End Date: 01/08/2022  ?This Visit's Progress: On track  ?Recent Progress: On track  ?Priority: High  ?Note:   ?Current Barriers:   ?Acute Mental Health Needs related to Anxiety, Financial Insecurities, and Caregiver Stress, requires Support, Education, Resources, Referrals, Advocacy, and Care Coordination, in order to meet Unmet Acute Mental Health Needs. ?Clinical Goal(s):  ?Patient will work with LCSW, to reduce and manage symptoms of Anxiety, Financial Insecurities, and Caregiver Stress, until well-controlled, or established with a community mental health provider.     ?Patient will increase knowledge and/or ability of:  ?      Coping Skills, Healthy Habits, Self-Management Skills, Stress Reduction, Home Safety and Utilizing [employer] and Resources.   ?Interventions: ?Inter-disciplinary care team collaboration (see longitudinal plan of care). ?Clinical Interventions:  ?Mindfulness Meditation Strategies, Relaxation Techniques, and Deep Breathing Exercises reviewed, and encouraged daily. ?Solution-Focused Therapy performed. ?Emotional Support provided. ?Verbalization of Feelings encouraged. ?Problem Solving Solutions developed. ?Cognitive Behavioral Therapy initiated. ?Patient Goals/Self-Care Activities: ?Continue to work with [employer], in an effort to obtain nutritional services and financial  assistance. ?Incorporate into daily  practice - relaxation techniques, deep breathing exercises, and mindfulness meditation strategies. ?Continue contacting List of Medicaid Approved Psychiatrists and Therapists, in an effort to establish ongoing mental health counseling and supportive services.   ?Contact LCSW directly (# [id number]), if you have questions, need assistance, or if additional social work needs are identified in the near future.   ?No Follow-Up Required.  ?Danford Bad, BSW, MSW, LCSW  ?Licensed Clinical Social Worker  ?[employer] Customer service manager Care Management ?[facility]  ?Mailing Address-[address], [place], [place] [address] ?Physical Address-[address] [place], [place], [place] [address] ?Toll Free Main # [phone] ?Fax # [phone] ?Cell # [phone] ?[email or web address] ? ? ? ? ? ?  ?

## 2022-01-18 ENCOUNTER — Other Ambulatory Visit: Payer: Self-pay

## 2022-01-19 ENCOUNTER — Encounter: Payer: Self-pay | Admitting: Obstetrics

## 2022-01-19 ENCOUNTER — Encounter: Payer: Self-pay | Admitting: *Deleted

## 2022-01-19 ENCOUNTER — Other Ambulatory Visit: Payer: Self-pay | Admitting: *Deleted

## 2022-01-19 DIAGNOSIS — N898 Other specified noninflammatory disorders of vagina: Secondary | ICD-10-CM

## 2022-01-19 MED ORDER — FLUCONAZOLE 150 MG PO TABS: 150.0000 mg | ORAL_TABLET | Freq: Once | ORAL | 0 refills | Status: AC

## 2022-01-19 NOTE — Progress Notes (Signed)
TC from pt reporting symptoms of vaginal yeast infection. RX sent per protocol. ?

## 2022-01-20 ENCOUNTER — Other Ambulatory Visit: Payer: Self-pay | Admitting: *Deleted

## 2022-01-20 ENCOUNTER — Ambulatory Visit: Payer: Medicaid Other

## 2022-01-20 NOTE — Patient Outreach (Signed)
?Medicaid Managed Care   ?Nurse Care Manager Note ? ?01/20/2022 ?Name:  Andrea Jenkins MRN:  [id number] DOB:  11-14-1995 ? ?Andrea Jenkins is an 26 y.o. year old female who is Jenkins primary Jenkins of Jenkins, No Pcp Per (Inactive).  The [facility] Managed Care Coordination team was consulted for assistance with:    ?Anxiety ?Depression ?Health Maintenance ? ?Andrea Jenkins was given information about Medicaid Managed Care Coordination team services today. Andrea Jenkins agreed to services and verbal consent obtained. ? ?Engaged with Jenkins by telephone for follow up visit in response to provider referral for case management and/or care coordination services.  ? ?Assessments/Interventions:  Review of past medical history, allergies, medications, health status, including review of consultants reports, laboratory and other test data, was performed as part of comprehensive evaluation and provision of chronic care management services. ? ?SDOH (Social Determinants of Health) assessments and interventions performed: ? ? ?Care Plan ? ?Allergies  ?Allergen Reactions  ? Peach [Prunus Persica] Hives  ? ? ?Medications Reviewed Today   ? ? Reviewed by [name], RN (Registered Nurse) on 01/20/22 at 1244  Med List Status: <None>  ? ?Medication Order Taking? Sig Documenting Provider Last Dose Status Informant  ?sertraline (ZOLOFT) 100 MG tablet [id number] No Take 100 mg by mouth daily.  ?Jenkins not taking: Reported on 01/20/2022  ? [provider] Not Taking Active   ?         ?Med Note (Andrea Jenkins   Wed Jan 20, 2022 12:43 PM) Ran out, unable to get refill  ?traMADol (ULTRAM) 50 MG tablet [id number] No Take 1-2 tablets (50-100 mg total) by mouth every 6 (six) hours as needed.  ?Jenkins not taking: Reported on 12/18/2021  ? Armandina Gemma, MD Not Taking Active   ? ?  ?  ? ?  ? ? ?Jenkins Active Problem List  ? Diagnosis Date Noted  ? Acute cholecystitis 10/30/2021  ? Anxiety   ? Alpha thalassemia silent  carrier 10/24/2019  ? 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 ? ?Conditions to be addressed/monitored per PCP order:  Anxiety, Depression, and Health Maintenance ? ?Care Plan : RN Care Manager Plan of Care  ?Updates made by [name], RN since 01/20/2022 12:00 AM  ?  ? ?Problem: Health Management Needs related to Health Maintenance, Anxiety and Depression   ?  ? ?Long-Range Goal: Development of Plan of Care to address Health Management Needs related to Health Maintenance, Anxiety and Depression   ?Start Date: 12/18/2021  ?Expected End Date: 02/16/2022  ?Priority: High  ?Note:   ?Current Barriers:  ?Knowledge Deficits related to plan of care for management of Anxiety with Panic Symptoms, and Depression: depressed mood ?decreased appetite and Health Maintenance -Andrea Jenkins would like to establish care with [facility] for PCP. She is waiting to be scheduled. She met with LCSW for managing  Anxiety and Depression, but needs Jenkins PCP to manage her zoloft prescription. ? ?RNCM Clinical Goal(s):  ?Jenkins will verbalize understanding of plan for management of Anxiety, Depression, and Health Maintenance as evidenced by verbalization of self monitoring activities ?take all medications exactly as prescribed and will call provider for medication related questions as evidenced by documentation in EMR    ?work with Education officer, museum to address [place] Concerns  related to the management of Anxiety and Depression as evidenced by review of EMR and Jenkins or social worker report     through collaboration with [place]  manager, provider, and care team. -Met ?Work with [place] for food insecurity and needing Jenkins dental provider-Met ? ?Interventions: ?Inter-disciplinary care team collaboration (see longitudinal plan of care) ?Evaluation of current treatment plan related to  self management and Jenkins's adherence to plan as established by provider ? ? ?Health Maintenance  (Status: New goal.) Long Term Goal  ?Evaluation of  current treatment plan related to Anxiety, Depression, and Health Maintenance , Limited access to food self-management and Jenkins's adherence to plan as established by provider. ?Discussed plans with Jenkins for ongoing care management follow up and provided Jenkins with direct contact information for care management team ?Advised Jenkins to call [facility] to schedule Jenkins New Jenkins appointment; ?Provided education to Jenkins re: diet and managing anxiety; ?Reviewed medications with Jenkins and discussed the importance of taking Zoloft every day, discussed setting an alarm; ?Discussed the importance of eating 3 meals Jenkins day, exercising regularly and increasing her nighttime sleep ? ?Jenkins Goals/Self-Care Activities: ?Take medications as prescribed   ?Attend church or other social activities ?Work with the Education officer, museum to address care coordination needs and will continue to work with the clinical team to address health care and disease management related needs ?call 1-800-273-TALK (toll free, 24 hour hotline) ?go to [facility] [address] [place] [phone]) ?call 911 if experiencing Jenkins Mental Health or [place]  ?Schedule new Jenkins appointment with provider of choice ? ? ?  ? ? ?Follow Up:  Jenkins agrees to Care Plan and Follow-up. ? ?Plan: The Managed Medicaid care management team will reach out to the Jenkins again over the next 30 days. ? ?Date/time of next scheduled RN care management/care coordination outreach:  02/22/22 @ 1:15pm ? ?Lurena Joiner RN, BSN ?[place] ?RN Care Coordinator ? ?

## 2022-01-20 NOTE — Patient Instructions (Signed)
Visit Information ? ?Andrea Jenkins was given information about Medicaid Managed Care team care coordination services as a part of their Healthy [facility] Medicaid benefit. Andrea Jenkins verbally consented to engagement with the [facility] Managed Care team.  ? ?If you are experiencing a medical emergency, please call 911 or report to your local emergency department or urgent care.  ? ?If you have a non-emergency medical problem during routine business hours, please contact your provider's office and ask to speak with a nurse.  ? ?For questions related to your Healthy [facility] health plan, please call: [phone] or visit the homepage here: [email or web address] ? ?If you would like to schedule transportation through your Healthy [facility] plan, please call the following number at least 2 days in advance of your appointment: [phone] ? For information about your ride after you set it up, call Ride Assist at [phone]. Use this number to activate a Will Call pickup, or if your transportation is late for a scheduled pickup. Use this number, too, if you need to make a change or cancel a previously scheduled reservation. ? If you need transportation services right away, call [phone]. The after-hours call center is staffed 24 hours to handle ride assistance and urgent reservation requests (including discharges) 365 days a year. Urgent trips include sick visits, hospital discharge requests and life-sustaining treatment. ? ?Call the [place] at [phone], at any time, 24 hours a day, 7 days a week. If you are in danger or need immediate medical attention call 911. ? ?If you would like help to quit smoking, call 1-800-QUIT-NOW (445) 494-0540) OR Espa?ol: 1-855-D?jelo-Ya (567)462-0496) o para m?s informaci?n haga clic aqu? or Text READY to 200-400 to register via text ? ?Andrea Jenkins, ? ? ?Please see education materials related to stress and health  maintenance provided by MyChart link. ? ?Patient verbalizes understanding of instructions and care plan provided today and agrees to view in [place]. Active MyChart status confirmed with patient.   ? ?Telephone follow up appointment with Managed Medicaid care management team member scheduled for:02/22/22 @ 1:15pm ? ?Lurena Joiner RN, BSN ?[place] ?RN Care Coordinator ? ? ?Following is a copy of your plan of care:  ?Care Plan : [place] of Care  ?Updates made by [name], RN since 01/20/2022 12:00 AM  ?  ? ?Problem: Health Management Needs related to Health Maintenance, Anxiety and Depression   ?  ? ?Long-Range Goal: Development of Plan of Care to address Health Management Needs related to Health Maintenance, Anxiety and Depression   ?Start Date: 12/18/2021  ?Expected End Date: 02/16/2022  ?Priority: High  ?Note:   ?Current Barriers:  ?Knowledge Deficits related to plan of care for management of Anxiety with Panic Symptoms, and Depression: depressed mood ?decreased appetite and Health Maintenance -Andrea Jenkins would like to establish care with [facility] for PCP. She is waiting to be scheduled. She met with LCSW for managing  Anxiety and Depression, but needs a PCP to manage her zoloft prescription. ? ?RNCM Clinical Goal(s):  ?Patient will verbalize understanding of plan for management of Anxiety, Depression, and Health Maintenance as evidenced by verbalization of self monitoring activities ?take all medications exactly as prescribed and will call provider for medication related questions as evidenced by documentation in EMR    ?work with Education officer, museum to address Person Concerns  related to the management of Anxiety and Depression as evidenced by review of EMR and patient or Education officer, museum  report     through collaboration with RN Care manager, provider, and care team. -Met ?Work with [place] for food insecurity and needing a dental  provider-Met ? ?Interventions: ?Inter-disciplinary care team collaboration (see longitudinal plan of care) ?Evaluation of current treatment plan related to  self management and patient's adherence to plan as established by provider ? ? ?Health Maintenance  (Status: New goal.) Long Term Goal  ?Evaluation of current treatment plan related to Anxiety, Depression, and Health Maintenance , Limited access to food self-management and patient's adherence to plan as established by provider. ?Discussed plans with patient for ongoing care management follow up and provided patient with direct contact information for care management team ?Advised patient to call [facility] to schedule a New Patient appointment; ?Provided education to patient re: diet and managing anxiety; ?Reviewed medications with patient and discussed the importance of taking Zoloft every day, discussed setting an alarm; ?Discussed the importance of eating 3 meals a day, exercising regularly and increasing her nighttime sleep ? ?Patient Goals/Self-Care Activities: ?Take medications as prescribed   ?Attend church or other social activities ?Work with the Education officer, museum to address care coordination needs and will continue to work with the clinical team to address health care and disease management related needs ?call 1-800-273-TALK (toll free, 24 hour hotline) ?go to [facility] [address] [place] [phone]) ?call 911 if experiencing a Mental Health or [place]  ?Schedule new patient appointment with provider of choice ? ? ?  ? ?  ?

## 2022-02-09 ENCOUNTER — Other Ambulatory Visit (HOSPITAL_COMMUNITY)
Admission: RE | Admit: 2022-02-09 | Discharge: 2022-02-09 | Disposition: A | Discharge status: Home / Self Care | Payer: Medicaid Other | Source: Ambulatory Visit | Attending: Obstetrics & Gynecology | Admitting: Obstetrics & Gynecology

## 2022-02-09 ENCOUNTER — Ambulatory Visit (INDEPENDENT_AMBULATORY_CARE_PROVIDER_SITE_OTHER): Payer: Medicaid Other

## 2022-02-09 DIAGNOSIS — N898 Other specified noninflammatory disorders of vagina: Secondary | ICD-10-CM | POA: Insufficient documentation

## 2022-02-09 DIAGNOSIS — Z113 Encounter for screening for infections with a predominantly sexual mode of transmission: Secondary | ICD-10-CM

## 2022-02-09 NOTE — Progress Notes (Signed)
..  SUBJECTIVE:  ?26 y.o. female complains of vaginal discharge/irritation for 3 day(s). ?Denies abnormal vaginal bleeding or significant pelvic pain or ?fever. No UTI symptoms. Denies history of known exposure to STD. Pt reports new partner ? ?No LMP recorded. ? ?OBJECTIVE:  ?She appears well, afebrile. ?Urine dipstick: not done. ? ?ASSESSMENT:  ?Vaginal Discharge  ?Vaginal Irritation ? ? ?PLAN:  ?GC, chlamydia, trichomonas, BVAG, CVAG probe sent to lab. ?Treatment: To be determined once lab results are received ?ROV prn if symptoms persist or worsen. ? ?

## 2022-02-10 LAB — CERVICOVAGINAL ANCILLARY ONLY
Bacterial Vaginitis (gardnerella): POSITIVE — AB
Candida Glabrata: NEGATIVE
Candida Vaginitis: NEGATIVE
Chlamydia: NEGATIVE
Comment: NEGATIVE
Comment: NEGATIVE
Comment: NEGATIVE
Comment: NEGATIVE
Comment: NEGATIVE
Comment: NORMAL
Neisseria Gonorrhea: NEGATIVE
Trichomonas: NEGATIVE

## 2022-02-16 ENCOUNTER — Encounter: Payer: Self-pay | Admitting: Obstetrics

## 2022-02-16 ENCOUNTER — Ambulatory Visit (INDEPENDENT_AMBULATORY_CARE_PROVIDER_SITE_OTHER): Payer: Medicaid Other | Admitting: Obstetrics

## 2022-02-16 VITALS — BP 163/81 | HR 75 | Wt 177.0 lb

## 2022-02-16 DIAGNOSIS — Z113 Encounter for screening for infections with a predominantly sexual mode of transmission: Secondary | ICD-10-CM

## 2022-02-16 DIAGNOSIS — Z0000 Encounter for general adult medical examination without abnormal findings: Secondary | ICD-10-CM | POA: Diagnosis not present

## 2022-02-16 DIAGNOSIS — J301 Allergic rhinitis due to pollen: Secondary | ICD-10-CM

## 2022-02-16 DIAGNOSIS — N939 Abnormal uterine and vaginal bleeding, unspecified: Secondary | ICD-10-CM | POA: Diagnosis not present

## 2022-02-16 DIAGNOSIS — E669 Obesity, unspecified: Secondary | ICD-10-CM

## 2022-02-16 DIAGNOSIS — N898 Other specified noninflammatory disorders of vagina: Secondary | ICD-10-CM

## 2022-02-16 DIAGNOSIS — Z3009 Encounter for other general counseling and advice on contraception: Secondary | ICD-10-CM | POA: Diagnosis not present

## 2022-02-16 MED ORDER — LORATADINE 10 MG PO TABS: 10.0000 mg | ORAL_TABLET | Freq: Every day | ORAL | 11 refills | Status: DC

## 2022-02-16 MED ORDER — METRONIDAZOLE 500 MG PO TABS: 500.0000 mg | ORAL_TABLET | Freq: Two times a day (BID) | ORAL | 2 refills | Status: DC

## 2022-02-16 MED ORDER — PHENTERMINE HCL 37.5 MG PO CAPS: 37.5000 mg | ORAL_CAPSULE | ORAL | 2 refills | Status: DC

## 2022-02-16 MED ORDER — ORTHO-NOVUM 1/35 (28) 1-35 MG-MCG PO TABS: 1.0000 | ORAL_TABLET | Freq: Every day | ORAL | 2 refills | Status: DC

## 2022-02-16 NOTE — Progress Notes (Signed)
Patient ID: Andrea Jenkins, female   DOB: 08-30-1996, 26 y.o.   MRN: [id number] ? ?Chief Complaint  ?Patient presents with  ? Follow-up  ? ? ?HPI ?Andrea Jenkins is a 26 y.o. female.  Complains of vaginal discharge.  She has a new partner, and suspect that he may have given her a STD.  Also c/o irregular vaginal bleeding with IUD.  Has had IUD for ~ 1 year. ?HPI ? ?Past Medical History:  ?Diagnosis Date  ? Anxiety   ? Asthma   ? pt states she doesn't use inhaler much (04/10/19)  ? Depression   ? Gonorrhea   ? Heart murmur   ? when younger  ? Vaginal Pap smear, abnormal   ? ? ?Past Surgical History:  ?Procedure Laterality Date  ? CATARACT EXTRACTION    ? CHOLECYSTECTOMY N/A 10/30/2021  ? Procedure: LAPAROSCOPIC CHOLECYSTECTOMY;  Surgeon: Fritzi Mandes, MD;  Location: [facility];  Service: General;  Laterality: N/A;  ? WISDOM TOOTH EXTRACTION    ? WRIST SURGERY    ? ? ?Family History  ?Problem Relation Age of Onset  ? Hypertension Mother   ? Miscarriages / [place] Mother   ? Hypertension Maternal Grandmother   ? Asthma Maternal Grandmother   ? Diabetes Maternal Grandmother   ? Vision loss Maternal Grandmother   ? Mental illness Brother   ? Cancer Maternal Aunt   ? Mental illness Sister   ? Heart disease Neg Hx   ? ? ?Social History ?Social History  ? ?Tobacco Use  ? Smoking status: Former  ?  Packs/day: 0.25  ?  Types: Cigars, Cigarettes  ?  Quit date: 03/11/2017  ?  Years since quitting: 4.9  ?  Passive exposure: Current  ? Smokeless tobacco: Never  ?Vaping Use  ? Vaping Use: Never used  ?Substance Use Topics  ? Alcohol use: Yes  ?  Comment: occ  ? Drug use: Yes  ?  Types: Marijuana  ?  Comment: occasioinally  ? ? ?Allergies  ?Allergen Reactions  ? Peach [Prunus Persica] Hives  ? ? ?Current Outpatient Medications  ?Medication Sig Dispense Refill  ? loratadine (CLARITIN) 10 MG tablet Take 1 tablet (10 mg total) by mouth daily. 30 tablet 11  ? metroNIDAZOLE (FLAGYL) 500 MG tablet Take 1 tablet (500 mg total) by mouth  2 (two) times daily. 14 tablet 2  ? norethindrone-ethinyl estradiol 1/35 (ORTHO-NOVUM 1/35, 28,) tablet Take 1 tablet by mouth daily. 28 tablet 2  ? phentermine 37.5 MG capsule Take 1 capsule (37.5 mg total) by mouth every morning. 30 capsule 2  ? sertraline (ZOLOFT) 100 MG tablet Take 100 mg by mouth daily.    ? traMADol (ULTRAM) 50 MG tablet Take 1-2 tablets (50-100 mg total) by mouth every 6 (six) hours as needed. (Patient not taking: Reported on 12/18/2021) 15 tablet 0  ? ?No current facility-administered medications for this visit.  ? ? ?Review of Systems ?Review of Systems ?Constitutional: negative for fatigue and weight loss ?Respiratory: negative for cough and wheezing ?Cardiovascular: negative for chest pain, fatigue and palpitations ?Gastrointestinal: negative for abdominal pain and change in bowel habits ?Genitourinary: positive for vaginal discharge and irregular vaginal bleeding ?Integument/breast: negative for nipple discharge ?Musculoskeletal:negative for myalgias ?Neurological: negative for gait problems and tremors ?Behavioral/Psych: negative for abusive relationship, depression ?Endocrine: negative for temperature intolerance    ?  ?Blood pressure (!) 163/81, pulse 75, weight 177 lb (80.3 kg), last menstrual period 02/03/2022, currently breastfeeding. ? ?Physical Exam ?Physical  Exam ?General:   Alert and no distress  ?Skin:   no rash or abnormalities  ?Lungs:   clear to auscultation bilaterally  ?Heart:   regular rate and rhythm, S1, S2 normal, no murmur, click, rub or gallop  ?Breasts:   Not examined  ?Abdomen:  normal findings: no organomegaly, soft, non-tender and no hernia  ?Pelvis:  External genitalia: normal general appearance ?Urinary system: urethral meatus normal and bladder without fullness, nontender ?Vaginal: normal without tenderness, induration or masses ?Cervix: normal appearance ?Adnexa: normal bimanual exam ?Uterus: anteverted and non-tender, normal size  ?  ?I have spent a total of  20 minutes of face-to-face time, excluding clinical staff time, reviewing notes and preparing to see patient, ordering tests and/or medications, and counseling the patient.  ? ?Data Reviewed ?Wet Prep ? ?Assessment  ?   ?1. Vaginal discharge ?Rx: ?- metroNIDAZOLE (FLAGYL) 500 MG tablet; Take 1 tablet (500 mg total) by mouth 2 (two) times daily.  Dispense: 14 tablet; Refill: 2 ? ?2. Screening examination for STD (sexually transmitted disease) ?Rx: ?- HIV Antibody (routine testing w rflx) ?- Hepatitis B surface antigen ?- RPR ?- Hepatitis C antibody ? ?3. Abnormal uterine bleeding (AUB) ?Rx: ?- norethindrone-ethinyl estradiol 1/35 (ORTHO-NOVUM 1/35, 28,) tablet; Take 1 tablet by mouth daily.  Dispense: 28 tablet; Refill: 2 ? ?4. Encounter for counseling regarding contraception ?- wants to continue IUD but concerned about irregular bleeding ? ?5. Seasonal allergic rhinitis due to pollen ?Rx: ?- loratadine (CLARITIN) 10 MG tablet; Take 1 tablet (10 mg total) by mouth daily.  Dispense: 30 tablet; Refill: 11 ? ?6. Obesity (BMI 30.0-34.9) ?Rx: ?- phentermine 37.5 MG capsule; Take 1 capsule (37.5 mg total) by mouth every morning.  Dispense: 30 capsule; Refill: 2 ? ?7. Routine adult health maintenance ?Rx: ?- Ambulatory referral to Internal Medicine   ? ? ?Plan ?   ?Orders Placed This Encounter  ?Procedures  ? HIV Antibody (routine testing w rflx)  ? Hepatitis B surface antigen  ? RPR  ? Hepatitis C antibody  ? Ambulatory referral to Internal Medicine  ?  Referral Priority:   Routine  ?  Referral Type:   Consultation  ?  Referral Reason:   Specialty Services Required  ?  Requested Specialty:   Internal Medicine  ?  Number of Visits Requested:   1  ? ?Meds ordered this encounter  ?Medications  ? DISCONTD: metroNIDAZOLE (FLAGYL) 500 MG tablet  ?  Sig: Take 1 tablet (500 mg total) by mouth 2 (two) times daily.  ?  Dispense:  14 tablet  ?  Refill:  2  ? metroNIDAZOLE (FLAGYL) 500 MG tablet  ?  Sig: Take 1 tablet (500 mg total)  by mouth 2 (two) times daily.  ?  Dispense:  14 tablet  ?  Refill:  2  ? norethindrone-ethinyl estradiol 1/35 (ORTHO-NOVUM 1/35, 28,) tablet  ?  Sig: Take 1 tablet by mouth daily.  ?  Dispense:  28 tablet  ?  Refill:  2  ? loratadine (CLARITIN) 10 MG tablet  ?  Sig: Take 1 tablet (10 mg total) by mouth daily.  ?  Dispense:  30 tablet  ?  Refill:  11  ? phentermine 37.5 MG capsule  ?  Sig: Take 1 capsule (37.5 mg total) by mouth every morning.  ?  Dispense:  30 capsule  ?  Refill:  2  ? ?  ? ? Brock Bad, MD ?02/16/2022 9:33 AM  ?

## 2022-02-17 ENCOUNTER — Encounter: Payer: Self-pay | Admitting: Obstetrics

## 2022-02-17 LAB — HEPATITIS C ANTIBODY: Hep C Virus Ab: NONREACTIVE

## 2022-02-17 LAB — HIV ANTIBODY (ROUTINE TESTING W REFLEX): HIV Screen 4th Generation wRfx: NONREACTIVE

## 2022-02-17 LAB — HEPATITIS B SURFACE ANTIGEN: Hepatitis B Surface Ag: NEGATIVE

## 2022-02-17 LAB — RPR: RPR Ser Ql: NONREACTIVE

## 2022-02-18 NOTE — Progress Notes (Signed)
Patient was assessed and managed by nursing staff during this encounter. I have reviewed the chart and agree with the documentation and plan. I have also made any necessary editorial changes. ? ?Emeterio Reeve, MD ?02/18/2022 10:27 AM  ? ?

## 2022-02-21 ENCOUNTER — Ambulatory Visit
Admission: EM | Admit: 2022-02-21 | Discharge: 2022-02-21 | Disposition: A | Discharge status: Home / Self Care | Payer: Medicaid Other | Attending: Urgent Care | Admitting: Urgent Care

## 2022-02-21 ENCOUNTER — Encounter: Payer: Self-pay | Admitting: Emergency Medicine

## 2022-02-21 ENCOUNTER — Other Ambulatory Visit: Payer: Self-pay

## 2022-02-21 DIAGNOSIS — N760 Acute vaginitis: Secondary | ICD-10-CM | POA: Diagnosis not present

## 2022-02-21 DIAGNOSIS — B3731 Acute candidiasis of vulva and vagina: Secondary | ICD-10-CM | POA: Insufficient documentation

## 2022-02-21 DIAGNOSIS — B9689 Other specified bacterial agents as the cause of diseases classified elsewhere: Secondary | ICD-10-CM | POA: Diagnosis not present

## 2022-02-21 MED ORDER — FLUCONAZOLE 150 MG PO TABS: 150.0000 mg | ORAL_TABLET | ORAL | 0 refills | Status: DC

## 2022-02-21 NOTE — ED Triage Notes (Signed)
Pt here for STD screening; pt sts some vaginal discomfort; pt sts has IUD ?

## 2022-02-21 NOTE — ED Provider Notes (Signed)
?[facility] ? ? ?MRN: [id number] DOB: [birth year] ? ?Subjective:  ? ?Andrea Jenkins is a 26 y.o. female presenting for 4 to 5-day history of recurrent vaginal discharge, vaginal discomfort. Patient recently tested positive for bacterial vaginosis.  This was from a test that was done on 02/09/2022.  She also had blood work done which was negative for HIV, syphilis, hepatitis B and C from lab work done on 02/16/2022.  This was sent to her gynecologist office.  Unfortunately they were not able to use the vaginal swab she did not because she had just had sex and the testing would not be reliable for STIs.  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  Right now she is actually taking metronidazole again to help her with this.  She does have unprotected sex with what is now a new partner but has had multiple partners in the past. ? ?No current facility-administered medications for this encounter. ? ?Current Outpatient Medications:  ?  loratadine (CLARITIN) 10 MG tablet, Take 1 tablet (10 mg total) by mouth daily., Disp: 30 tablet, Rfl: 11 ?  metroNIDAZOLE (FLAGYL) 500 MG tablet, Take 1 tablet (500 mg total) by mouth 2 (two) times daily. (Patient not taking: Reported on 02/21/2022), Disp: 14 tablet, Rfl: 2 ?  norethindrone-ethinyl estradiol 1/35 (ORTHO-NOVUM 1/35, 28,) tablet, Take 1 tablet by mouth daily., Disp: 28 tablet, Rfl: 2 ?  phentermine 37.5 MG capsule, Take 1 capsule (37.5 mg total) by mouth every morning., Disp: 30 capsule, Rfl: 2 ?  sertraline (ZOLOFT) 100 MG tablet, Take 100 mg by mouth daily., Disp: , Rfl:  ?  traMADol (ULTRAM) 50 MG tablet, Take 1-2 tablets (50-100 mg total) by mouth every 6 (six) hours as needed. (Patient not taking: Reported on 12/18/2021), Disp: 15 tablet, Rfl: 0  ? ?Allergies  ?Allergen Reactions  ? Peach [Prunus Persica] Hives  ? ? ?Past Medical History:  ?Diagnosis Date  ? Anxiety   ? Asthma   ? pt states she doesn't use inhaler much (04/10/19)  ? Depression    ? Gonorrhea   ? Heart murmur   ? when younger  ? Vaginal Pap smear, abnormal   ?  ? ?Past Surgical History:  ?Procedure Laterality Date  ? CATARACT EXTRACTION    ? CHOLECYSTECTOMY N/A 10/30/2021  ? Procedure: LAPAROSCOPIC CHOLECYSTECTOMY;  Surgeon: Fritzi Mandes, MD;  Location: [facility];  Service: General;  Laterality: N/A;  ? WISDOM TOOTH EXTRACTION    ? WRIST SURGERY    ? ? ?Family History  ?Problem Relation Age of Onset  ? Hypertension Mother   ? Miscarriages / [place] Mother   ? Hypertension Maternal Grandmother   ? Asthma Maternal Grandmother   ? Diabetes Maternal Grandmother   ? Vision loss Maternal Grandmother   ? Mental illness Brother   ? Cancer Maternal Aunt   ? Mental illness Sister   ? Heart disease Neg Hx   ? ? ?Social History  ? ?Tobacco Use  ? Smoking status: Former  ?  Packs/day: 0.25  ?  Types: Cigars, Cigarettes  ?  Quit date: 03/11/2017  ?  Years since quitting: 4.9  ?  Passive exposure: Current  ? Smokeless tobacco: Never  ?Vaping Use  ? Vaping Use: Never used  ?Substance Use Topics  ? Alcohol use: Yes  ?  Comment: occ  ? Drug use: Yes  ?  Types: Marijuana  ?  Comment: occasioinally  ? ? ?ROS ? ? ?Objective:  ? ?Vitals: ?  BP 124/84 (BP Location: Left Arm)   Pulse 99   Temp 97.9 ?F (36.6 ?C) (Oral)   Resp 18   LMP 02/03/2022   SpO2 97%  ? ?Physical Exam ?Constitutional:   ?   General: She is not in acute distress. ?   Appearance: Normal appearance. She is well-developed. She is not ill-appearing, toxic-appearing or diaphoretic.  ?HENT:  ?   Head: Normocephalic and atraumatic.  ?   Nose: Nose normal.  ?   Mouth/Throat:  ?   Mouth: Mucous membranes are moist.  ?Eyes:  ?   General: No scleral icterus.    ?   Right eye: No discharge.     ?   Left eye: No discharge.  ?   Extraocular Movements: Extraocular movements intact.  ?Cardiovascular:  ?   Rate and Rhythm: Normal rate.  ?Pulmonary:  ?   Effort: Pulmonary effort is normal.  ?Skin: ?   General: Skin is warm and dry.  ?Neurological:  ?    General: No focal deficit present.  ?   Mental Status: She is alert and oriented to person, place, and time.  ?Psychiatric:     ?   Mood and Affect: Mood normal.     ?   Behavior: Behavior normal.  ? ? ?Assessment and Plan :  ? ?PDMP not reviewed this encounter. ? ?1. Yeast vaginitis   ?2. Bacterial vaginosis   ? ?Recommended empiric treatment with fluconazole, vaginal swab results pending.  Counseled patient on potential for adverse effects with medications prescribed/recommended today, ER and return-to-clinic precautions discussed, patient verbalized understanding. ? ?  ?Wallis Bamberg, PA-C ?02/21/22 1432 ? ?

## 2022-02-22 ENCOUNTER — Other Ambulatory Visit: Payer: Self-pay | Admitting: *Deleted

## 2022-02-22 NOTE — Patient Outreach (Signed)
?Medicaid Managed Care   ?Nurse Care Manager Note ? ?02/22/2022 ?Name:  Andrea Jenkins MRN:  [id number] DOB:  23-Feb-1996 ? ?Andrea Jenkins is an 26 y.o. year old female who is a primary patient of Patient, No Pcp Per (Inactive).  The [facility] Managed Care Coordination team was consulted for assistance with:    ?Health Maintenance ? ?Andrea Jenkins was given information about Medicaid Managed Care Coordination team services today. [place] Patient agreed to services and verbal consent obtained. ? ?Engaged with patient by telephone for follow up visit in response to provider referral for case management and/or care coordination services.  ? ?Assessments/Interventions:  Review of past medical history, allergies, medications, health status, including review of consultants reports, laboratory and other test data, was performed as part of comprehensive evaluation and provision of chronic care management services. ? ?SDOH (Social Determinants of Health) assessments and interventions performed: ? ? ?Care Plan ? ?Allergies  ?Allergen Reactions  ? Peach [Prunus Persica] Hives  ? ? ?Medications Reviewed Today   ? ? Reviewed by [name], RN (Registered Nurse) on 02/22/22 at 1317  Med List Status: <None>  ? ?Medication Order Taking? Sig Documenting Provider Last Dose Status Informant  ?fluconazole (DIFLUCAN) 150 MG tablet [id number] No Take 1 tablet (150 mg total) by mouth once a week.  ?Patient not taking: Reported on 02/22/2022  ? Jaynee Eagles, PA-C Not Taking Active   ?         ?Med Note (Hava Massingale A   Mon Feb 22, 2022  1:16 PM) Needs to pick up   ?loratadine (CLARITIN) 10 MG tablet [id number] Yes Take 1 tablet (10 mg total) by mouth daily. Shelly Bombard, MD Taking Active   ?metroNIDAZOLE (FLAGYL) 500 MG tablet [id number] Yes Take 1 tablet (500 mg total) by mouth 2 (two) times daily. Shelly Bombard, MD Taking Active   ?norethindrone-ethinyl estradiol 1/35 ([place] 1/35, 28,) tablet [id number] Yes Take 1  tablet by mouth daily. Shelly Bombard, MD Taking Active   ?phentermine 37.5 MG capsule [id number] Yes Take 1 capsule (37.5 mg total) by mouth every morning. Shelly Bombard, MD Taking Active   ?sertraline (ZOLOFT) 100 MG tablet [id number] Yes Take 100 mg by mouth daily. [provider] Taking Active   ?         ?Med Note (Wahid Holley A   Mon Feb 22, 2022  1:17 PM)    ?traMADol (ULTRAM) 50 MG tablet [id number] No Take 1-2 tablets (50-100 mg total) by mouth every 6 (six) hours as needed.  ?Patient not taking: Reported on 12/18/2021  ? Armandina Gemma, MD Not Taking Active   ? ?  ?  ? ?  ? ? ?Patient Active Problem List  ? Diagnosis Date Noted  ? Acute cholecystitis 10/30/2021  ? Anxiety   ? Alpha thalassemia silent carrier 10/24/2019  ? 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 ? ?Conditions to be addressed/monitored per PCP order:   Health Maintenance ? ?Care Plan : RN Care Manager Plan of Care  ?Updates made by [name], RN since 02/22/2022 12:00 AM  ?  ? ?Problem: Health Management Needs related to Health Maintenance, Anxiety and Depression   ?  ? ?Long-Range Goal: Development of Plan of Care to address Health Management Needs related to Health Maintenance, Anxiety and Depression   ?Start Date: 12/18/2021  ?Expected End Date: 04/22/2022  ?Priority: High  ?Note:   ?Current Barriers:  ?Knowledge Deficits related to  plan of care for management of Anxiety with Panic Symptoms, and Depression: depressed mood ?decreased appetite and Health Maintenance -Andrea Jenkins has not established care with PCP. A referral was placed to Internal Medicine to establish care. Andrea Jenkins request dental providers accepting Medicaid.  ? ?RNCM Clinical Goal(s):  ?Patient will verbalize understanding of plan for management of Anxiety, Depression, and Health Maintenance as evidenced by verbalization of self monitoring activities ?take all medications exactly as prescribed and will call provider for medication related questions as  evidenced by documentation in EMR    ?work with Education officer, museum to address [place] Concerns  related to the management of Anxiety and Depression as evidenced by review of EMR and patient or social worker report     through collaboration with Consulting civil engineer, provider, and care team. -Met ?Work with [place] for food insecurity and needing a dental provider-Met ? ?Interventions: ?Inter-disciplinary care team collaboration (see longitudinal plan of care) ?Evaluation of current treatment plan related to  self management and patient's adherence to plan as established by provider ? ? ?Health Maintenance  (Status: Goal on track: NO.) Long Term Goal  ?Evaluation of current treatment plan related to Anxiety, Depression, and Health Maintenance , Limited access to food self-management and patient's adherence to plan as established by provider. ?Discussed plans with patient for ongoing care management follow up and provided patient with direct contact information for care management team ?Advised patient to call [facility] Internal Medicine [phone] to schedule a New Patient appointment; ?Provided education to patient re: preventive health; ?Reviewed medications with patient and discussed the importance of taking Zoloft as directed and the importance of having a PCP to manage medications; ?Provided patient with MyChart educational materials related to Health Maintenance; ?Discussed the importance of eating 3 meals a day, exercising regularly and increasing her nighttime sleep ?Provided patient with [facility] [phone] and [facility] [phone] ? ?Patient Goals/Self-Care Activities: ?Take medications as prescribed   ?Attend church or other social activities ?Work with the Education officer, museum to address care coordination needs and will continue to work with the clinical team to address health care and disease management related needs ?call 1-800-273-TALK (toll free, 24 hour hotline) ?go to  [facility] [address] [place] [phone]) ?call 911 if experiencing a Mental Health or [place]  ?Schedule new patient appointment with provider of choice ? ? ?  ? ? ?Follow Up:  Patient agrees to Care Plan and Follow-up. ? ?Plan: The Managed Medicaid care management team will reach out to the patient again over the next 60 days. ? ?Date/time of next scheduled RN care management/care coordination outreach:  04/22/22 @ 10:30am ? ?Lurena Joiner RN, BSN ?[place] ?RN Care Coordinator ? ?

## 2022-02-22 NOTE — Patient Instructions (Signed)
Visit Information ? ?Andrea Jenkins was given information about Medicaid Managed Care team care coordination services as a part of their Healthy [facility] Medicaid benefit. Andrea Jenkins verbally consented to engagement with the [facility] Managed Care team.  ? ?If you are experiencing a medical emergency, please call 911 or report to your local emergency department or urgent care.  ? ?If you have a non-emergency medical problem during routine business hours, please contact your provider's office and ask to speak with a nurse.  ? ?For questions related to your Healthy [facility] health plan, please call: [phone] or visit the homepage here: [email or web address] ? ?If you would like to schedule transportation through your Healthy [facility] plan, please call the following number at least 2 days in advance of your appointment: [phone] ? For information about your ride after you set it up, call Ride Assist at [phone]. Use this number to activate a Will Call pickup, or if your transportation is late for a scheduled pickup. Use this number, too, if you need to make a change or cancel a previously scheduled reservation. ? If you need transportation services right away, call [phone]. The after-hours call center is staffed 24 hours to handle ride assistance and urgent reservation requests (including discharges) 365 days a year. Urgent trips include sick visits, hospital discharge requests and life-sustaining treatment. ? ?Call the [place] at [phone], at any time, 24 hours a day, 7 days a week. If you are in danger or need immediate medical attention call 911. ? ?If you would like help to quit smoking, call 1-800-QUIT-NOW 850-619-8826) OR Espa?ol: 1-855-D?jelo-Ya 618-141-4574) o para m?s informaci?n haga clic aqu? or Text READY to 200-400 to register via text ? ?Andrea Jenkins, ? ? ?Please see education materials related to Health  Maintenance provided by MyChart link. ? ?Patient verbalizes understanding of instructions and care plan provided today and agrees to view in [place]. Active MyChart status confirmed with patient.   ? ?Telephone follow up appointment with Managed Medicaid care management team member scheduled for:04/22/22 @ 10:30am ? ?Lurena Joiner RN, BSN ?[place] ?RN Care Coordinator ? ? ?Following is a copy of your plan of care:  ?Care Plan : [place] of Care  ?Updates made by [name], RN since 02/22/2022 12:00 AM  ?  ? ?Problem: Health Management Needs related to Health Maintenance, Anxiety and Depression   ?  ? ?Long-Range Goal: Development of Plan of Care to address Health Management Needs related to Health Maintenance, Anxiety and Depression   ?Start Date: 12/18/2021  ?Expected End Date: 04/22/2022  ?Priority: High  ?Note:   ?Current Barriers:  ?Knowledge Deficits related to plan of care for management of Anxiety with Panic Symptoms, and Depression: depressed mood ?decreased appetite and Health Maintenance -Andrea Jenkins has not established care with PCP. A referral was placed to Internal Medicine to establish care. Andrea Jenkins request dental providers accepting Medicaid.  ? ?RNCM Clinical Goal(s):  ?Patient will verbalize understanding of plan for management of Anxiety, Depression, and Health Maintenance as evidenced by verbalization of self monitoring activities ?take all medications exactly as prescribed and will call provider for medication related questions as evidenced by documentation in EMR    ?work with Education officer, museum to address [place] Concerns  related to the management of Anxiety and Depression as evidenced by review of EMR and patient or Education officer, museum report     through collaboration with Consulting civil engineer, provider, and  care team. -Met ?Work with [place] for food insecurity and needing a dental provider-Met ? ?Interventions: ?Inter-disciplinary care team  collaboration (see longitudinal plan of care) ?Evaluation of current treatment plan related to  self management and patient's adherence to plan as established by provider ? ? ?Health Maintenance  (Status: Goal on track: NO.) Long Term Goal  ?Evaluation of current treatment plan related to Anxiety, Depression, and Health Maintenance , Limited access to food self-management and patient's adherence to plan as established by provider. ?Discussed plans with patient for ongoing care management follow up and provided patient with direct contact information for care management team ?Advised patient to call [facility] Internal Medicine [phone] to schedule a New Patient appointment; ?Provided education to patient re: preventive health; ?Reviewed medications with patient and discussed the importance of taking Zoloft as directed and the importance of having a PCP to manage medications; ?Provided patient with MyChart educational materials related to Health Maintenance; ?Discussed the importance of eating 3 meals a day, exercising regularly and increasing her nighttime sleep ?Provided patient with [facility] [place] [phone] and [facility] [phone] ? ?Patient Goals/Self-Care Activities: ?Take medications as prescribed   ?Attend church or other social activities ?Work with the Education officer, museum to address care coordination needs and will continue to work with the clinical team to address health care and disease management related needs ?call 1-800-273-TALK (toll free, 24 hour hotline) ?go to [facility] [address] [place] [phone]) ?call 911 if experiencing a Mental Health or [place]  ?Schedule new patient appointment with provider of choice ? ? ?  ? ?  ?

## 2022-02-23 ENCOUNTER — Encounter (HOSPITAL_BASED_OUTPATIENT_CLINIC_OR_DEPARTMENT_OTHER): Payer: Self-pay

## 2022-02-23 ENCOUNTER — Emergency Department (HOSPITAL_BASED_OUTPATIENT_CLINIC_OR_DEPARTMENT_OTHER)
Admission: EM | Admit: 2022-02-23 | Discharge: 2022-02-23 | Disposition: A | Discharge status: Home / Self Care | Payer: Medicaid Other | Attending: Emergency Medicine | Admitting: Emergency Medicine

## 2022-02-23 ENCOUNTER — Ambulatory Visit
Admission: EM | Admit: 2022-02-23 | Discharge: 2022-02-23 | Discharge status: Absent without leave | Payer: Medicaid Other

## 2022-02-23 ENCOUNTER — Other Ambulatory Visit: Payer: Self-pay

## 2022-02-23 DIAGNOSIS — J029 Acute pharyngitis, unspecified: Secondary | ICD-10-CM | POA: Insufficient documentation

## 2022-02-23 LAB — CERVICOVAGINAL ANCILLARY ONLY
Bacterial Vaginitis (gardnerella): NEGATIVE
Candida Glabrata: NEGATIVE
Candida Vaginitis: NEGATIVE
Chlamydia: NEGATIVE
Comment: NEGATIVE
Comment: NEGATIVE
Comment: NEGATIVE
Comment: NEGATIVE
Comment: NEGATIVE
Comment: NORMAL
Neisseria Gonorrhea: NEGATIVE
Trichomonas: NEGATIVE

## 2022-02-23 MED ORDER — AMOXICILLIN 500 MG PO CAPS: 500.0000 mg | ORAL_CAPSULE | Freq: Two times a day (BID) | ORAL | 0 refills | Status: DC

## 2022-02-23 NOTE — ED Triage Notes (Signed)
Patient here POV from Home. ? ?Endorses Sore Throat since having Sexual Intercourse on 04/22. Tested Negative for STD Screening at UC recently.  ? ?No Cough or Fever. ? ?NAD Noted during Triage. A&Ox4. GCS 15. Ambulatory. ?

## 2022-02-23 NOTE — ED Notes (Signed)
Patient verbalizes understanding of discharge instructions. Opportunity for questioning and answers were provided. Patient discharged from ED.  °

## 2022-02-23 NOTE — Discharge Instructions (Signed)
It was a pleasure taking care of you today! ? ?You have pending lab results for STI work-up.  You may see the results of your labs on MyChart.  Return to the emergency department for treatment if your results resulted as positive.  You will be sent a prescription to treat for presumptive strep throat, amoxicillin, if your results turn negative for strep throat, you may discontinue use of amoxicillin.  Ensure to complete the entire course of antibiotics prescribed. You may follow up with your primary care provider or Health Department if you are experiencing continued symptoms.  If you have future STI concerns you may follow-up with the regional [facility] [phone]).  This location provides testing and treatment for STI and prep services.  This is free for individuals without insurance.  Appointments are required and typically you can get in on the same day.  Ensure to practice safe sex with condom use. Return to the Emergency Department if you are experiencing fever, increasing/worsening abdominal pain, or vomiting.  ?

## 2022-02-23 NOTE — ED Provider Notes (Signed)
?[facility] EMERGENCY DEPT ?Provider Note ? ? ?CSN: [id number] ?Arrival date & time: 02/23/22  1216 ? ?  ? ?History ? ?Chief Complaint  ?Patient presents with  ? Sore Throat  ? ? ?Andrea Jenkins is a 26 y.o. female who presents to the emergency department with concerns for sore throat onset 02/13/22.  Denies sick contacts. Pt notes that she was sexually active and voices concern for STI in her throat as the cause of her symptoms. Denies fever, chills, nasal congestion, rhinorrhea, cough, trouble swallowing, nausea, or vomiting. Pt notes that she was seen at Urgent Care and had negative STI work-up completed on 02/21/2022. ? ?Per pt chart review: Pt was evaluated at Urgent Care on 02/21/22 for GU concerns. At that time, pt was empirically treated with fluconazole and informed that her swab was pending.  ? ?The history is provided by the patient. No language interpreter was used.  ? ?  ? ?Home Medications ?Prior to Admission medications   ?Medication Sig Start Date End Date Taking? Authorizing Provider  ?amoxicillin (AMOXIL) 500 MG capsule Take 1 capsule (500 mg total) by mouth 2 (two) times daily for 10 days. 02/23/22 03/05/22 Yes Leshawn Houseworth A, PA-C  ?fluconazole (DIFLUCAN) 150 MG tablet Take 1 tablet (150 mg total) by mouth once a week. ?Patient not taking: Reported on 02/22/2022 02/21/22   Wallis BambergMani, Mario, PA-C  ?loratadine (CLARITIN) 10 MG tablet Take 1 tablet (10 mg total) by mouth daily. 02/16/22   Brock BadHarper, Charles A, MD  ?metroNIDAZOLE (FLAGYL) 500 MG tablet Take 1 tablet (500 mg total) by mouth 2 (two) times daily. 02/16/22   Brock BadHarper, Charles A, MD  ?norethindrone-ethinyl estradiol 1/35 (ORTHO-NOVUM 1/35, 28,) tablet Take 1 tablet by mouth daily. 02/16/22   Brock BadHarper, Charles A, MD  ?phentermine 37.5 MG capsule Take 1 capsule (37.5 mg total) by mouth every morning. 02/16/22   Brock BadHarper, Charles A, MD  ?sertraline (ZOLOFT) 100 MG tablet Take 100 mg by mouth daily.    [provider]  ?traMADol (ULTRAM) 50 MG  tablet Take 1-2 tablets (50-100 mg total) by mouth every 6 (six) hours as needed. ?Patient not taking: Reported on 12/18/2021 10/31/21   Darnell LevelGerkin, Todd, MD  ?   ? ?Allergies    ?Peach [prunus persica]   ? ?Review of Systems   ?Review of Systems  ?Constitutional:  Negative for chills and fever.  ?HENT:  Positive for sore throat. Negative for congestion, rhinorrhea and trouble swallowing.   ?Respiratory:  Negative for cough.   ?Gastrointestinal:  Negative for nausea and vomiting.  ?All other systems reviewed and are negative. ? ?Physical Exam ?Updated Vital Signs ?BP 121/71 (BP Location: Right Arm)   Pulse 72   Temp 98 ?F (36.7 ?C)   Resp 16   Ht 5' 3.5" (1.613 m)   Wt 80.3 kg   LMP 02/03/2022   SpO2 100%   BMI 30.87 kg/m?  ?Physical Exam ?Vitals and nursing note reviewed.  ?Constitutional:   ?   General: She is not in acute distress. ?   Appearance: She is not diaphoretic.  ?HENT:  ?   Head: Normocephalic and atraumatic.  ?   Mouth/Throat:  ?   Mouth: Mucous membranes are moist.  ?   Pharynx: Oropharynx is clear. Uvula midline. Posterior oropharyngeal erythema present. No oropharyngeal exudate or uvula swelling.  ?   Tonsils: Tonsillar exudate present.  ?   Comments: Patent airway.  Uvula midline without swelling.  Posterior pharyngeal erythema noted.  Tonsillar exudate  noted. ?Eyes:  ?   General: No scleral icterus. ?   Conjunctiva/sclera: Conjunctivae normal.  ?Cardiovascular:  ?   Rate and Rhythm: Normal rate and regular rhythm.  ?   Pulses: Normal pulses.  ?   Heart sounds: Normal heart sounds.  ?Pulmonary:  ?   Effort: Pulmonary effort is normal. No respiratory distress.  ?   Breath sounds: Normal breath sounds. No wheezing.  ?Abdominal:  ?   General: Bowel sounds are normal.  ?   Palpations: Abdomen is soft. There is no mass.  ?   Tenderness: There is no abdominal tenderness. There is no guarding or rebound.  ?Musculoskeletal:     ?   General: Normal range of motion.  ?   Cervical back: Normal range of  motion and neck supple.  ?Skin: ?   General: Skin is warm and dry.  ?Neurological:  ?   Mental Status: She is alert.  ?Psychiatric:     ?   Behavior: Behavior normal.  ? ? ?ED Results / Procedures / Treatments   ?Labs ?(all labs ordered are listed, but only abnormal results are displayed) ?Labs Reviewed  ?GC/CHLAMYDIA PROBE AMP ([place]) NOT AT [facility]  ? ? ?EKG ?None ? ?Radiology ?No results found. ? ?Procedures ?Procedures  ? ? ?Medications Ordered in ED ?Medications - No data to display ? ?ED Course/ Medical Decision Making/ A&P ?Clinical Course as of 02/23/22 1621  ?Tue Feb 23, 2022  ?1335 Spoke with lab who noted that there was an error with running patient strep swab, confirmed with the lab that they will rerun the sample. [SB]  ?1341 Notified that patient needs to leave to pick up her kids. [SB]  ?1347 Pt re-evaluated and noted that she needs to leave and pick up her kids.  Discussed with patient discharge treatment plan.  Answered all available questions.  Patient appears safe for discharge at this time. [SB]  ?  ?Clinical Course User Index ?[SB] Lamond Glantz A, PA-C  ? ?                        ?Medical Decision Making ?Risk ?Prescription drug management. ? ? ?Pt presents with sore throat onset 02/13/2022.  Denies sick contacts.  Patient notes she was recently tested negative for STDs through urgent care. Vital signs, stable, pt afebrile. On exam, pt with patent airway.  Uvula midline without swelling.  Posterior pharyngeal erythema noted.  Tonsillar exudate noted. No acute cardiovascular, respiratory, abdominal exam findings. Differential diagnosis includes strep pharyngitis, viral pharyngitis, viral URI, STI.  ? ?Additional history obtained:  ?External records from outside source obtained and reviewed including: Pt was evaluated at Urgent Care on 02/21/22 for GU concerns. At that time, pt was empirically treated with fluconazole and informed that her swab was pending.  ? ?Labs:  ?I ordered, and personally  interpreted labs.  The pertinent results include:   ?GC/Chlamydia swab ordered with results pending at time of discharge.  ?Strep swab ordered with results pending at time of discharge.  ? ?Disposition: ?Presentation suspicious for pharyngitis.  Due to patient having to leave the emergency department, unable to assess whether strep is the cause of her sore throat, swab pending at time of discharge.  To rule out STI as cause of her sore throat, gonorrhea chlamydia swab sent with results pending at time of discharge.  After consideration of the diagnostic results and the patients response to treatment, I feel that the patient would  benefit from Discharge home.  We will treat prophylactically for strep pharyngitis with amoxicillin.  Discussed with patient and strep swab results come back negative that she can discontinue the amoxicillin.  Also discussed with the patient that she may follow-up in the emergency department pending her GC chlamydia swab.  Supportive care measures and strict return precautions discussed with patient at bedside. Pt acknowledges and verbalizes understanding. Pt appears safe for discharge. Follow up as indicated in discharge paperwork.  ? ? ?This chart was dictated using voice recognition software, Dragon. Despite the best efforts of this provider to proofread and correct errors, errors may still occur which can change documentation meaning. ? ?Final Clinical Impression(s) / ED Diagnoses ?Final diagnoses:  ?Pharyngitis, unspecified etiology  ? ? ?Rx / DC Orders ?ED Discharge Orders   ? ?      Ordered  ?  amoxicillin (AMOXIL) 500 MG capsule  2 times daily       ? 02/23/22 1348  ? ?  ?  ? ?  ? ? ?  ?Yeilyn Gent A, PA-C ?02/23/22 1622 ? ?  ?Maia Plan, MD ?02/25/22 1120 ? ?

## 2022-02-24 ENCOUNTER — Telehealth: Payer: Self-pay

## 2022-02-24 LAB — GC/CHLAMYDIA PROBE AMP (~~LOC~~) NOT AT ARMC
Chlamydia: NEGATIVE
Comment: NEGATIVE
Comment: NORMAL
Neisseria Gonorrhea: NEGATIVE

## 2022-02-24 NOTE — Telephone Encounter (Signed)
Transition Care Management Follow-up Telephone Call ?Date of discharge and from where: 02/23/2022 from [facility] ?How have you been since you were released from the hospital? Patient stated that she is still having trouble with swallowing but will pick up the abx today from the pharmacy.  ?Any questions or concerns? No ? ?Items Reviewed: ?Did the pt receive and understand the discharge instructions provided? Yes  ?Medications obtained and verified? Yes  ?Other? No  ?Any new allergies since your discharge? No  ?Dietary orders reviewed? No ?Do you have support at home? Yes  ? ?Functional Questionnaire: (I = Independent and D = Dependent) ?ADLs: I ? ?Bathing/Dressing- I ? ?Meal Prep- I ? ?Eating- I ? ?Maintaining continence- I ? ?Transferring/Ambulation- I ? ?Managing Meds- I ? ? ?Follow up appointments reviewed: ? ?PCP Hospital f/u appt confirmed? Yes  Scheduled to see Elige Radon, MD on 03/08/2022 @ 1:15pm. ?Specialist Hospital f/u appt confirmed? No   ?Are transportation arrangements needed? No  ?If their condition worsens, is the pt aware to call PCP or go to the Emergency Dept.? Yes ?Was the patient provided with contact information for the PCP's office or ED? Yes ?Was to pt encouraged to call back with questions or concerns? Yes ? ?

## 2022-03-05 ENCOUNTER — Encounter (HOSPITAL_COMMUNITY): Payer: Self-pay | Admitting: Emergency Medicine

## 2022-03-05 ENCOUNTER — Observation Stay (HOSPITAL_COMMUNITY)
Admission: EM | Admit: 2022-03-05 | Discharge: 2022-03-06 | Disposition: A | Discharge status: Home / Self Care | Payer: Medicaid Other | Attending: Internal Medicine | Admitting: Internal Medicine

## 2022-03-05 ENCOUNTER — Ambulatory Visit (HOSPITAL_COMMUNITY)
Admission: EM | Admit: 2022-03-05 | Discharge: 2022-03-05 | Disposition: A | Discharge status: Home / Self Care | Payer: Medicaid Other | Attending: Internal Medicine | Admitting: Internal Medicine

## 2022-03-05 ENCOUNTER — Other Ambulatory Visit: Payer: Self-pay

## 2022-03-05 DIAGNOSIS — T50905A Adverse effect of unspecified drugs, medicaments and biological substances, initial encounter: Secondary | ICD-10-CM | POA: Insufficient documentation

## 2022-03-05 DIAGNOSIS — T782XXA Anaphylactic shock, unspecified, initial encounter: Principal | ICD-10-CM | POA: Insufficient documentation

## 2022-03-05 DIAGNOSIS — R000 Tachycardia, unspecified: Secondary | ICD-10-CM | POA: Diagnosis not present

## 2022-03-05 DIAGNOSIS — J45909 Unspecified asthma, uncomplicated: Secondary | ICD-10-CM | POA: Diagnosis not present

## 2022-03-05 DIAGNOSIS — Z79899 Other long term (current) drug therapy: Secondary | ICD-10-CM | POA: Insufficient documentation

## 2022-03-05 DIAGNOSIS — R1111 Vomiting without nausea: Secondary | ICD-10-CM | POA: Diagnosis not present

## 2022-03-05 DIAGNOSIS — T7840XA Allergy, unspecified, initial encounter: Secondary | ICD-10-CM | POA: Diagnosis not present

## 2022-03-05 DIAGNOSIS — Z87891 Personal history of nicotine dependence: Secondary | ICD-10-CM | POA: Diagnosis not present

## 2022-03-05 MED ORDER — FAMOTIDINE IN NACL 20-0.9 MG/50ML-% IV SOLN
20.0000 mg | Freq: Once | INTRAVENOUS | Status: AC
  Administered 2022-03-05: 20 mg via INTRAVENOUS
  Filled 2022-03-05: qty 50

## 2022-03-05 MED ORDER — LORAZEPAM 2 MG/ML IJ SOLN
0.5000 mg | Freq: Once | INTRAMUSCULAR | Status: AC
  Administered 2022-03-05: 0.5 mg via INTRAVENOUS
  Filled 2022-03-05: qty 1

## 2022-03-05 MED ORDER — DEXAMETHASONE SODIUM PHOSPHATE 10 MG/ML IJ SOLN
INTRAMUSCULAR | Status: AC
  Filled 2022-03-05: qty 1

## 2022-03-05 MED ORDER — DEXAMETHASONE SODIUM PHOSPHATE 10 MG/ML IJ SOLN
10.0000 mg | Freq: Once | INTRAMUSCULAR | Status: AC
  Administered 2022-03-05: 10 mg via INTRAMUSCULAR

## 2022-03-05 MED ORDER — EPINEPHRINE 0.3 MG/0.3ML IJ SOAJ
0.3000 mg | Freq: Once | INTRAMUSCULAR | Status: AC
  Administered 2022-03-05: 0.3 mg via INTRAMUSCULAR

## 2022-03-05 NOTE — ED Provider Notes (Signed)
I saw and evaluated the patient, reviewed the resident's note and I agree with the findings and plan. ? ?EKG Interpretation ? ?Date/Time:  Friday Mar 05 2022 20:31:45 EDT ?Ventricular Rate:  121 ?PR Interval:  129 ?QRS Duration: 95 ?QT Interval:  359 ?QTC Calculation: 512 ?R Axis:   91 ?Text Interpretation: Sinus tachycardia Borderline right axis deviation Abnormal T, consider ischemia, diffuse leads Prolonged QT interval Confirmed by Lorre Nick ([id number]) on 03/05/2022 8:58:10 PM ?26 year old female presents after having an allergic reaction to unknown substance.  Went to urgent care and was given epinephrine x2 along with Decadron.  Had previously had Benadryl.  She is protecting her airway at this time.  Will monitor here and likely admit ?  ?Lorre Nick, MD ?03/05/22 2038 ? ?

## 2022-03-05 NOTE — ED Provider Notes (Signed)
?[facility] ?Provider Note ? ? ?CSN: [id number] ?Arrival date & time: 03/05/22  2028 ? ?  ? ?History ? ?Chief Complaint  ?Patient presents with  ? Allergic Reaction  ? ? ? ? ? ?Allergic Reaction ?Presenting symptoms: difficulty swallowing and rash (resolved)   ?Andrea Jenkins is a 26 y.o. female with a history of anxiety, asthma, depression presenting to the ED with concerns for anaphylaxis.  Patient states that she took her normal medications (Zoloft and her OCP, both of which she has been on for several weeks) this evening.  She states that immediately afterward, she felt very itchy over her chest and extending up to her neck.  She took p.o. Benadryl with only mild improvement so she went to urgent care.  At urgent care, she started complaining of feeling as if her throat was closing as well as shortness of breath and was having "dry heaving".  She was given p.o. Decadron as well as IM epinephrine due to concerns for anaphylaxis.  Her symptoms slightly improved after the IM epinephrine, but her repeat blood pressure was hypotensive with systolics in the 90s so she was given a second dose of IM epinephrine and then transferred to the ED.  On arrival to the ED, patient is unable to speak due to pain in her throat and difficulty swallowing.  She denies any difficulty breathing.  She has never had reactions like this in the past and does not own an EpiPen.  Her only known allergy is to peaches which causes some scratchiness in her throat and swelling of her lips. ?  ? ?Home Medications ?Prior to Admission medications   ?Medication Sig Start Date End Date Taking? Authorizing Provider  ?diphenhydrAMINE (BENADRYL) 25 MG tablet Take 25 mg by mouth every 6 (six) hours as needed (for allergic reactions).   Yes [provider]  ?loratadine (CLARITIN) 10 MG tablet Take 1 tablet (10 mg total) by mouth daily. 02/16/22  Yes Brock BadHarper, Charles A, MD  ?NORTREL 1/35, 28, tablet Take 1  tablet by mouth daily.   Yes [provider]  ?phentermine 37.5 MG capsule Take 1 capsule (37.5 mg total) by mouth every morning. 02/16/22  Yes Brock BadHarper, Charles A, MD  ?sertraline (ZOLOFT) 50 MG tablet Take 50 mg by mouth in the morning. 02/15/22  Yes [provider]  ?   ? ?Allergies    ?Peach [prunus persica]   ? ?Review of Systems   ?Review of Systems  ?HENT:  Positive for trouble swallowing.   ?Respiratory:  Positive for shortness of breath (resolved).   ?Gastrointestinal:  Positive for vomiting (resolved).  ?Skin:  Positive for rash (resolved).  ?Neurological:  Negative for syncope.  ? ?Physical Exam ?Updated Vital Signs ?BP 123/75   Pulse 78   Resp 20   Ht 5\' 3"  (1.6 m)   Wt 80.2 kg   SpO2 98%   BMI 31.32 kg/m?  ?Physical Exam ?Constitutional:   ?   Appearance: She is ill-appearing. She is not diaphoretic.  ?   Comments: Uncomfortable appearing.  ?HENT:  ?   Head: Normocephalic and atraumatic.  ?   Right Ear: External ear normal.  ?   Left Ear: External ear normal.  ?   Nose: Nose normal.  ?   Mouth/Throat:  ?   Mouth: Mucous membranes are moist.  ?   Pharynx: Oropharynx is clear.  ?   Comments: No tongue, lip, or oropharyngeal swelling. ?Eyes:  ?  General: No scleral icterus. ?Cardiovascular:  ?   Rate and Rhythm: Regular rhythm. Tachycardia present.  ?   Heart sounds: Normal heart sounds. No murmur heard. ?  No friction rub. No gallop.  ?Pulmonary:  ?   Breath sounds: Normal breath sounds. No stridor. No wheezing, rhonchi or rales.  ?   Comments: Patient is tachypneic and intermittently hyperventilates but is able to volitionally cough and take deep breaths. ?Abdominal:  ?   General: There is no distension.  ?   Palpations: Abdomen is soft.  ?   Tenderness: There is no abdominal tenderness. There is no guarding or rebound.  ?Musculoskeletal:     ?   General: No deformity.  ?   Cervical back: Neck supple.  ?Skin: ?   General: Skin is warm and dry.  ?   Comments: No urticaria noted.   ?Neurological:  ?   General: No focal deficit present.  ?   Mental Status: She is alert.  ? ? ?ED Results / Procedures / Treatments   ?Labs ?(all labs ordered are listed, but only abnormal results are displayed) ?Labs Reviewed - No data to display ? ?EKG ?EKG Interpretation ? ?Date/Time:  Friday Mar 05 2022 20:31:45 EDT ?Ventricular Rate:  121 ?PR Interval:  129 ?QRS Duration: 95 ?QT Interval:  359 ?QTC Calculation: 512 ?R Axis:   91 ?Text Interpretation: Sinus tachycardia Borderline right axis deviation Abnormal T, consider ischemia, diffuse leads Prolonged QT interval Confirmed by Lorre Nick ([id number]) on 03/05/2022 8:38:11 PM ? ?Radiology ?No results found. ? ?Procedures ?Procedures  ? ? ?Medications Ordered in ED ?Medications  ?enoxaparin (LOVENOX) injection 40 mg (has no administration in time range)  ?acetaminophen (TYLENOL) tablet 650 mg (has no administration in time range)  ?  Or  ?acetaminophen (TYLENOL) suppository 650 mg (has no administration in time range)  ?ondansetron (ZOFRAN) tablet 4 mg (has no administration in time range)  ?  Or  ?ondansetron (ZOFRAN) injection 4 mg (has no administration in time range)  ?famotidine (PEPCID) IVPB 20 mg premix (0 mg Intravenous Stopped 03/05/22 2213)  ?LORazepam (ATIVAN) injection 0.5 mg (0.5 mg Intravenous Given 03/05/22 2055)  ? ? ?ED Course/ Medical Decision Making/ A&P ?  ?                        ?Medical Decision Making ?Risk ?Prescription drug management. ?Decision regarding hospitalization. ? ? ?26 year old female with history of anxiety, asthma, and depression presenting to the ED with concerns for anaphylaxis. ? ?On exam, patient is afebrile and hemodynamically stable.  On arrival, she is unable to speak and continues to point out her throat.  I was able to get her to write down that she felt as if something is stuck in her throat, however she denies any difficulty breathing.  Her lungs are clear to auscultation in all lung fields and she has oxygen  saturation is 98% on room air.  No stridor on exam.  No oropharyngeal swelling on exam.  No obvious urticaria and she is not actively vomiting. ? ?Based on her presentation to urgent care, I am concerned that she had an anaphylactic reaction.  At this time, it does appear to be much improved after IM epi x2.  Patient does appear incredibly anxious and I think some of her current presentation is due to anxiety so she was given 0.5 mg IV Ativan as well as IV Pepcid to complete her treatment for anaphylaxis.  We will plan  to observe in the ED but will likely require admission given the need for multiple doses of IM epinephrine. ? ?On reevaluation, patient is now able to speak in full sentences and is not in any respiratory distress.  She has not required any further doses of IM epinephrine while in the ED.  She does continue to endorse a feeling in her throat and feels that it is still slightly difficult to swallow, but she is able to handle her secretions.  Given the severity of the reaction and her continued abnormal feeling in her throat, will admit for observation.  Patient was admitted to medicine in stable condition. ? ? ?Final Clinical Impression(s) / ED Diagnoses ?Final diagnoses:  ?Anaphylaxis, initial encounter  ? ? ?Rx / DC Orders ?ED Discharge Orders   ? ? None  ? ?  ? ? ?  ?Laurence Compton, MD ?03/06/22 0051 ? ?  ?Lorre Nick, MD ?03/06/22 1643 ? ?

## 2022-03-05 NOTE — ED Triage Notes (Signed)
Pt reports about 645 started itching all over, pt spitting into emesis bag, asked patient if unable to swallow as to why spitting, replies "guess so". Reports that feels like something moving up in her throat.  ?Started after taking her normally prescribed medications.  ?Mother gave Benadryl 25 mg.  ?

## 2022-03-05 NOTE — ED Notes (Signed)
Epi pen given via right thigh at 1958 verbal order Jacki Cones NP ?

## 2022-03-05 NOTE — ED Notes (Addendum)
Decadron 10 mg given IM in right ventrogluteal via verbal order from Dr Leonides Grills at 743pm  ? ?

## 2022-03-05 NOTE — ED Notes (Signed)
Epi pen administered at 747pm verbal order from Dr Leonides Grills  ?

## 2022-03-05 NOTE — ED Triage Notes (Signed)
Pt arrive by EMS transferred from [facility] for allergic reaction, pt got 2 epi pens and decadron prior to arrival to ED. Pt unable to talk with providers on ED arrival. NAD noticed. ?

## 2022-03-06 DIAGNOSIS — T7840XA Allergy, unspecified, initial encounter: Secondary | ICD-10-CM

## 2022-03-06 MED ORDER — ONDANSETRON HCL 4 MG/2ML IJ SOLN: 4.0000 mg | Freq: Four times a day (QID) | INTRAMUSCULAR | Status: DC | PRN

## 2022-03-06 MED ORDER — ACETAMINOPHEN 325 MG PO TABS: 650.0000 mg | ORAL_TABLET | Freq: Four times a day (QID) | ORAL | Status: DC | PRN

## 2022-03-06 MED ORDER — ONDANSETRON HCL 4 MG PO TABS
4.0000 mg | ORAL_TABLET | Freq: Four times a day (QID) | ORAL | Status: DC | PRN
Start: 2022-03-06 — End: 2022-03-06

## 2022-03-06 MED ORDER — ACETAMINOPHEN 650 MG RE SUPP: 650.0000 mg | Freq: Four times a day (QID) | RECTAL | Status: DC | PRN

## 2022-03-06 MED ORDER — PREDNISONE 50 MG PO TABS: 50.0000 mg | ORAL_TABLET | Freq: Every day | ORAL | 0 refills | Status: AC

## 2022-03-06 MED ORDER — EPINEPHRINE 0.3 MG/0.3ML IJ SOAJ: 0.3000 mg | INTRAMUSCULAR | 1 refills | Status: DC | PRN

## 2022-03-06 MED ORDER — FAMOTIDINE 20 MG PO TABS: 20.0000 mg | ORAL_TABLET | Freq: Two times a day (BID) | ORAL | 0 refills | Status: DC

## 2022-03-06 MED ORDER — ENOXAPARIN SODIUM 40 MG/0.4ML IJ SOSY: 40.0000 mg | PREFILLED_SYRINGE | INTRAMUSCULAR | Status: DC

## 2022-03-06 NOTE — Assessment & Plan Note (Signed)
Apparently had allergic reaction to either Zoloft or OCP today. ?1. Neither pill is completely new though she recently restarted zoloft some 3 weeks ago. ?2. Got EPI x2 at [facility], no symptoms since arrival to ED 5-6 hrs ago. ?3. Regardless: ?1. Stopping both meds ?2. Observe patient overnight. ?4. Will need to follow up with allergist as outpt. ?5. May or may not need epi-pen on discharge, will defer to day team. ?

## 2022-03-06 NOTE — Discharge Summary (Signed)
Physician Discharge Summary  ?Andrea Jenkins NGE:[id number] DOB: [birth year] DOA: 03/05/2022 ? ?PCP: Patient, No Pcp Per (Inactive) ? ?Admit date: 03/05/2022 ?Discharge date: 03/06/2022 ? ?Admitted From: Home ?Disposition: Home ? ?Recommendations for Outpatient Follow-up:  ?Follow up with PCP in 1-2 weeks ?Please obtain BMP/CBC in one week your next doctors visit.  ?Prednisone, Pepcid prescribed upon discharge.  EpiPen prescribed as well ?Advised to discontinue her oral contraceptive at this time and we discussed this with her prescribing provider ?She can continue using Benadryl over-the-counter at home as well ? ? ?Discharge Condition: Stable ?CODE STATUS: Full code ?Diet recommendation: Regular ? ?Brief/Interim Summary: ?26 year old with history of anxiety/depression who has been on Zoloft for several weeks came to the hospital for anaphylaxis reaction.  Patient was prescribed oral contraceptive which she just started 1 day prior to admission.  Upon admission she was given 2 rounds of epinephrine, steroids, Pepcid and Benadryl.  Over the course of several hours her symptoms resolved and she was back to baseline.  No further complaints.  Recommendations as stated above. ? ? ?Assessment and Plan: ?* Allergic reaction to drug, initial encounter ?-Anaphylaxis reaction earlier in the ED requiring epinephrine pen along with steroids, Pepcid and Benadryl.  We will transition this to p.o. and discharged home in stable condition.  I advised her to discontinue her Nortrel/OCP.  Follow-up outpatient with her PCP. ? ?Anxiety/depression ?- Continue home Zoloft ? ?She is also been on phentermine for several weeks for weight loss which was prescribed by her PCP. ? ?  ?Body mass index is 31.32 kg/m?. ? ?  ? ? ? ?Discharge Diagnoses:  ?Principal Problem: ?  Allergic reaction to drug, initial encounter ? ? ? ? ? ?Consultations: ?None ? ?Subjective: ?Feels great back to baseline.  No respiratory difficulty at all.  She would like to  be discharged. ? ?Discharge Exam: ?Vitals:  ? 03/06/22 0100 03/06/22 0506  ?BP: 122/74 123/79  ?Pulse: 76 97  ?Resp: 20 16  ?Temp:  98.5 ?F (36.9 ?C)  ?SpO2: 98% 100%  ? ?Vitals:  ? 03/05/22 2315 03/06/22 0045 03/06/22 0100 03/06/22 0506  ?BP: 123/75 119/68 122/74 123/79  ?Pulse: 78 77 76 97  ?Resp: 20 20 20 16   ?Temp:    98.5 ?F (36.9 ?C)  ?TempSrc:    Oral  ?SpO2: 98% 99% 98% 100%  ?Weight:      ?Height:      ? ? ?General: Pt is alert, awake, not in acute distress ?Cardiovascular: RRR, S1/S2 +, no rubs, no gallops ?Respiratory: CTA bilaterally, no wheezing, no rhonchi ?Abdominal: Soft, NT, ND, bowel sounds + ?Extremities: no edema, no cyanosis ? ?Discharge Instructions ? ? ?Allergies as of 03/06/2022   ? ?   Reactions  ? Peach [prunus Persica] Hives, Itching  ? ?  ? ?  ?Medication List  ?  ? ?STOP taking these medications   ? ?Nortrel 1/35 (28) tablet ?Generic drug: norethindrone-ethinyl estradiol 1/35 ?  ? ?  ? ?TAKE these medications   ? ?diphenhydrAMINE 25 MG tablet ?Commonly known as: BENADRYL ?Take 25 mg by mouth every 6 (six) hours as needed (for allergic reactions). ?  ?EPINEPHrine 0.3 mg/0.3 mL Soaj injection ?Commonly known as: EpiPen 2-Pak ?Inject 0.3 mg into the muscle as needed for anaphylaxis. ?  ?famotidine 20 MG tablet ?Commonly known as: PEPCID ?Take 1 tablet (20 mg total) by mouth 2 (two) times daily for 5 days. ?  ?loratadine 10 MG tablet ?Commonly known as: Claritin ?Take 1 tablet (  10 mg total) by mouth daily. ?  ?phentermine 37.5 MG capsule ?Take 1 capsule (37.5 mg total) by mouth every morning. ?  ?predniSONE 50 MG tablet ?Commonly known as: DELTASONE ?Take 1 tablet (50 mg total) by mouth daily for 5 days. ?  ?sertraline 50 MG tablet ?Commonly known as: ZOLOFT ?Take 50 mg by mouth in the morning. ?  ? ?  ? ? ?Allergies  ?Allergen Reactions  ? Peach [Prunus Persica] Hives and Itching  ? ? ?You were cared for by a hospitalist during your hospital stay. If you have any questions about your  discharge medications or the care you received while you were in the hospital after you are discharged, you can call the unit and asked to speak with the hospitalist on call if the hospitalist that took care of you is not available. Once you are discharged, your primary care physician will handle any further medical issues. Please note that no refills for any discharge medications will be authorized once you are discharged, as it is imperative that you return to your primary care physician (or establish a relationship with a primary care physician if you do not have one) for your aftercare needs so that they can reassess your need for medications and monitor your lab values. ? ? ?Procedures/Studies: ?No results found. ? ? ?The results of significant diagnostics from this hospitalization (including imaging, microbiology, ancillary and laboratory) are listed below for reference.   ? ? ?Microbiology: ?No results found for this or any previous visit (from the past 240 hour(s)).  ? ?Labs: ?BNP (last 3 results) ?No results for input(s): BNP in the last 8760 hours. ?Basic Metabolic Panel: ?No results for input(s): NA, K, CL, CO2, GLUCOSE, BUN, CREATININE, CALCIUM, MG, PHOS in the last 168 hours. ?Liver Function Tests: ?No results for input(s): AST, ALT, ALKPHOS, BILITOT, PROT, ALBUMIN in the last 168 hours. ?No results for input(s): LIPASE, AMYLASE in the last 168 hours. ?No results for input(s): AMMONIA in the last 168 hours. ?CBC: ?No results for input(s): WBC, NEUTROABS, HGB, HCT, MCV, PLT in the last 168 hours. ?Cardiac Enzymes: ?No results for input(s): CKTOTAL, CKMB, CKMBINDEX, TROPONINI in the last 168 hours. ?BNP: ?Invalid input(s): POCBNP ?CBG: ?No results for input(s): GLUCAP in the last 168 hours. ?D-Dimer ?No results for input(s): DDIMER in the last 72 hours. ?Hgb A1c ?No results for input(s): HGBA1C in the last 72 hours. ?Lipid Profile ?No results for input(s): CHOL, HDL, LDLCALC, TRIG, CHOLHDL, LDLDIRECT in  the last 72 hours. ?Thyroid function studies ?No results for input(s): TSH, T4TOTAL, T3FREE, THYROIDAB in the last 72 hours. ? ?Invalid input(s): FREET3 ?Anemia work up ?No results for input(s): VITAMINB12, FOLATE, FERRITIN, TIBC, IRON, RETICCTPCT in the last 72 hours. ?Urinalysis ?   ?Component Value Date/Time  ? COLORURINE YELLOW 10/30/2021 1120  ? APPEARANCEUR CLEAR 10/30/2021 1120  ? LABSPEC 1.026 10/30/2021 1120  ? PHURINE 5.0 10/30/2021 1120  ? GLUCOSEU NEGATIVE 10/30/2021 1120  ? HGBUR NEGATIVE 10/30/2021 1120  ? BILIRUBINUR NEGATIVE 10/30/2021 1120  ? BILIRUBINUR Negative 08/31/2021 1112  ? KETONESUR NEGATIVE 10/30/2021 1120  ? PROTEINUR NEGATIVE 10/30/2021 1120  ? UROBILINOGEN 0.2 08/31/2021 1112  ? UROBILINOGEN 1.0 01/14/2016 1323  ? NITRITE NEGATIVE 10/30/2021 1120  ? LEUKOCYTESUR NEGATIVE 10/30/2021 1120  ? ?Sepsis Labs ?Invalid input(s): PROCALCITONIN,  WBC,  LACTICIDVEN ?Microbiology ?No results found for this or any previous visit (from the past 240 hour(s)). ? ? ?Time coordinating discharge:  ?I have spent 35 minutes face to face  with the patient and on the ward discussing the patients care, assessment, plan and disposition with other care givers. >50% of the time was devoted counseling the patient about the risks and benefits of treatment/Discharge disposition and coordinating care.  ? ?SIGNED: ? ? ?Abrie Egloff Joline Maxcy, MD  ?[facility] ?03/06/2022, 11:53 AM ? ? ?If 7PM-7AM, please contact night-coverage ? ? ?

## 2022-03-06 NOTE — ED Notes (Signed)
Patient is currently sleeping.

## 2022-03-06 NOTE — H&P (Signed)
?History and Physical  ? ? ?Patient: Andrea Jenkins [id number] DOB: 03/09/1996 ?DOA: 03/05/2022 ?DOS: the patient was seen and examined on 03/06/2022 ?PCP: Patient, No Pcp Per (Inactive)  ?Patient coming from: Home ? ?Chief Complaint:  ?Chief Complaint  ?Patient presents with  ? Allergic Reaction  ? ?HPI: Andrea Jenkins is a 26 y.o. female with medical history significant of anxiety and depression. ? ?Pt recently got restarted on zoloft some 3 weeks ago. ? ?Pt presents to ED after onset of allergic reaction involving respiratory distress and hives reportedly that onset earlier this evening right after taking her OCP and zoloft. ? ?She initially went to UC where she was given 2 rounds of epinephrine and decadron.  Got benadryl at home prior to going to UC. ? ?Pt asymptomatic in ED, resp distress and hives seem to have resolved but pt getting admitted for observation at this time given severity of initial reaction. ?  ?Review of Systems: As mentioned in the history of present illness. All other systems reviewed and are negative. ?Past Medical History:  ?Diagnosis Date  ? Anxiety   ? Asthma   ? pt states she doesn't use inhaler much (04/10/19)  ? Depression   ? Gonorrhea   ? Heart murmur   ? when younger  ? Vaginal Pap smear, abnormal   ? ?Past Surgical History:  ?Procedure Laterality Date  ? CATARACT EXTRACTION    ? CHOLECYSTECTOMY N/A 10/30/2021  ? Procedure: LAPAROSCOPIC CHOLECYSTECTOMY;  Surgeon: Dwan Bolt, MD;  Location: [facility];  Service: General;  Laterality: N/A;  ? WISDOM TOOTH EXTRACTION    ? WRIST SURGERY    ? ?Social History:  reports that she quit smoking about 4 years ago. Her smoking use included cigars and cigarettes. She smoked an average of .25 packs per day. She has been exposed to tobacco smoke. She has never used smokeless tobacco. She reports current alcohol use. She reports that she does not currently use drugs after having used the following drugs: Marijuana. ? ?Allergies  ?Allergen  Reactions  ? Peach [Prunus Persica] Hives and Itching  ? ? ?Family History  ?Problem Relation Age of Onset  ? Hypertension Mother   ? Miscarriages / [place] Mother   ? Hypertension Maternal Grandmother   ? Asthma Maternal Grandmother   ? Diabetes Maternal Grandmother   ? Vision loss Maternal Grandmother   ? Mental illness Brother   ? Cancer Maternal Aunt   ? Mental illness Sister   ? Heart disease Neg Hx   ? ? ?Prior to Admission medications   ?Medication Sig Start Date End Date Taking? Authorizing Provider  ?diphenhydrAMINE (BENADRYL) 25 MG tablet Take 25 mg by mouth every 6 (six) hours as needed (for allergic reactions).   Yes [provider]  ?loratadine (CLARITIN) 10 MG tablet Take 1 tablet (10 mg total) by mouth daily. 02/16/22  Yes Shelly Bombard, MD  ?NORTREL 1/35, 28, tablet Take 1 tablet by mouth daily.   Yes [provider]  ?phentermine 37.5 MG capsule Take 1 capsule (37.5 mg total) by mouth every morning. 02/16/22  Yes Shelly Bombard, MD  ?sertraline (ZOLOFT) 50 MG tablet Take 50 mg by mouth in the morning. 02/15/22  Yes [provider]  ? ? ?Physical Exam: ?Vitals:  ? 03/05/22 2130 03/05/22 2137 03/05/22 2230 03/05/22 2315  ?BP: 132/78  123/79 123/75  ?Pulse:   79 78  ?Resp: (!) 21 (!) 25 19 20   ?SpO2: 100%  99% 98%  ?  Weight:      ?Height:      ? ?Constitutional: NAD, calm, comfortable ?Eyes: PERRL, lids and conjunctivae normal ?ENMT: Mucous membranes are moist. Posterior pharynx clear of any exudate or lesions.Normal dentition.  ?Neck: normal, supple, no masses, no thyromegaly ?Respiratory: clear to auscultation bilaterally, no wheezing, no crackles. Normal respiratory effort. No accessory muscle use.  ?Cardiovascular: Regular rate and rhythm, no murmurs / rubs / gallops. No extremity edema. 2+ pedal pulses. No carotid bruits.  ?Abdomen: no tenderness, no masses palpated. No hepatosplenomegaly. Bowel sounds positive.  ?Musculoskeletal: no clubbing / cyanosis. No joint  deformity upper and lower extremities. Good ROM, no contractures. Normal muscle tone.  ?Skin: no rashes, lesions, ulcers. No induration ?Neurologic: CN 2-12 grossly intact. Sensation intact, DTR normal. Strength 5/5 in all 4.  ?Psychiatric: Normal judgment and insight. Alert and oriented x 3. Normal mood.  ? ?Data Reviewed: ? ?There are no new results to review at this time. ? ?Assessment and Plan: ?* Allergic reaction to drug, initial encounter ?Apparently had allergic reaction to either Zoloft or OCP today. ?Neither pill is completely new though she recently restarted zoloft some 3 weeks ago. ?Got EPI x2 at [facility], no symptoms since arrival to ED 5-6 hrs ago. ?Regardless: ?Stopping both meds ?Observe patient overnight. ?Will need to follow up with allergist as outpt. ?May or may not need epi-pen on discharge, will defer to day team. ? ? ? ? ? Advance Care Planning:   Code Status: Full Code ? ?Consults: None ? ?Family Communication: No family in room ? ?Severity of Illness: ?The appropriate patient status for this patient is OBSERVATION. Observation status is judged to be reasonable and necessary in order to provide the required intensity of service to ensure the patient's safety. The patient's presenting symptoms, physical exam findings, and initial radiographic and laboratory data in the context of their medical condition is felt to place them at decreased risk for further clinical deterioration. Furthermore, it is anticipated that the patient will be medically stable for discharge from the hospital within 2 midnights of admission.  ? ?Author: ?Etta Quill., DO ?03/06/2022 12:16 AM ? ?For on call review [email or web address].  ?

## 2022-03-08 ENCOUNTER — Ambulatory Visit: Payer: Medicaid Other | Admitting: Internal Medicine

## 2022-03-08 ENCOUNTER — Encounter: Payer: Self-pay | Admitting: *Deleted

## 2022-03-18 ENCOUNTER — Encounter: Payer: Self-pay | Admitting: Internal Medicine

## 2022-03-18 ENCOUNTER — Ambulatory Visit (INDEPENDENT_AMBULATORY_CARE_PROVIDER_SITE_OTHER): Payer: Medicaid Other | Admitting: Internal Medicine

## 2022-03-18 VITALS — BP 123/86 | HR 87 | Temp 98.1°F | Ht 63.0 in | Wt 167.7 lb

## 2022-03-18 DIAGNOSIS — N920 Excessive and frequent menstruation with regular cycle: Secondary | ICD-10-CM | POA: Diagnosis not present

## 2022-03-18 DIAGNOSIS — N924 Excessive bleeding in the premenopausal period: Secondary | ICD-10-CM | POA: Diagnosis not present

## 2022-03-18 DIAGNOSIS — R5383 Other fatigue: Secondary | ICD-10-CM

## 2022-03-18 DIAGNOSIS — E663 Overweight: Secondary | ICD-10-CM | POA: Diagnosis not present

## 2022-03-18 DIAGNOSIS — F32A Depression, unspecified: Secondary | ICD-10-CM

## 2022-03-18 DIAGNOSIS — T782XXS Anaphylactic shock, unspecified, sequela: Secondary | ICD-10-CM | POA: Diagnosis not present

## 2022-03-18 DIAGNOSIS — T7840XA Allergy, unspecified, initial encounter: Secondary | ICD-10-CM

## 2022-03-18 DIAGNOSIS — F53 Postpartum depression: Secondary | ICD-10-CM

## 2022-03-18 NOTE — Patient Instructions (Addendum)
To Andrea Jenkins,  It was a pleasure meeting you today! Today we discussed the prolonged and heavier periods, depression, weight, and allergies.   For the prolonged and heavier periods, I believe your IUD may be the cause for your heavier bleeding. Please discuss with your OBGYN for other treatment options. We are going to check your blood levels and iron levels today as well.   For your depression, please continue your Zoloft, the information for [facility] is provided below:  Chief Financial Officer at [facility] ([facility]) [address], [address] [place], [place] [address] [phone] Fax: [phone] Hours of Operation: Monday-Friday, 8 a.m. - 5 p.m. Office closed for lunch, 12 p.m. - 1 p.m. The last Open Access walk-in will be taken at 3 p.m. each day   For your weight gain, I am going to check your thyroid level today. Continue taking phentermine for the next two months and we will follow up with you for next steps.   For your allergies, I have placed a referral to an allergist for skin testing.   We will see you in 2 months! Have a good day,  Dolan Amen, MD

## 2022-03-19 ENCOUNTER — Ambulatory Visit (HOSPITAL_COMMUNITY)
Admission: EM | Admit: 2022-03-19 | Discharge: 2022-03-19 | Disposition: A | Discharge status: Home / Self Care | Payer: Medicaid Other | Attending: Behavioral Health | Admitting: Behavioral Health

## 2022-03-19 DIAGNOSIS — R4586 Emotional lability: Secondary | ICD-10-CM

## 2022-03-19 DIAGNOSIS — E663 Overweight: Secondary | ICD-10-CM | POA: Insufficient documentation

## 2022-03-19 DIAGNOSIS — Z8659 Personal history of other mental and behavioral disorders: Secondary | ICD-10-CM | POA: Insufficient documentation

## 2022-03-19 DIAGNOSIS — R5383 Other fatigue: Secondary | ICD-10-CM | POA: Insufficient documentation

## 2022-03-19 DIAGNOSIS — G479 Sleep disorder, unspecified: Secondary | ICD-10-CM | POA: Insufficient documentation

## 2022-03-19 DIAGNOSIS — F314 Bipolar disorder, current episode depressed, severe, without psychotic features: Secondary | ICD-10-CM | POA: Insufficient documentation

## 2022-03-19 DIAGNOSIS — F32A Depression, unspecified: Secondary | ICD-10-CM | POA: Insufficient documentation

## 2022-03-19 DIAGNOSIS — F1090 Alcohol use, unspecified, uncomplicated: Secondary | ICD-10-CM | POA: Insufficient documentation

## 2022-03-19 DIAGNOSIS — E669 Obesity, unspecified: Secondary | ICD-10-CM | POA: Insufficient documentation

## 2022-03-19 DIAGNOSIS — N920 Excessive and frequent menstruation with regular cycle: Secondary | ICD-10-CM | POA: Insufficient documentation

## 2022-03-19 LAB — IRON AND TIBC
Iron Saturation: 26 % (ref 15–55)
Iron: 98 ug/dL (ref 27–159)
Total Iron Binding Capacity: 376 ug/dL (ref 250–450)
UIBC: 278 ug/dL (ref 131–425)

## 2022-03-19 LAB — FERRITIN: Ferritin: 109 ng/mL (ref 15–150)

## 2022-03-19 LAB — TSH: TSH: 1.43 u[IU]/mL (ref 0.450–4.500)

## 2022-03-19 LAB — CBC
Hematocrit: 41.7 % (ref 34.0–46.6)
Hemoglobin: 13.9 g/dL (ref 11.1–15.9)
MCH: 27.9 pg (ref 26.6–33.0)
MCHC: 33.3 g/dL (ref 31.5–35.7)
MCV: 84 fL (ref 79–97)
Platelets: 233 10*3/uL (ref 150–450)
RBC: 4.98 x10E6/uL (ref 3.77–5.28)
RDW: 13.6 % (ref 11.7–15.4)
WBC: 3.7 10*3/uL (ref 3.4–10.8)

## 2022-03-19 LAB — VITAMIN B12: Vitamin B-12: 749 pg/mL (ref 232–1245)

## 2022-03-19 NOTE — Discharge Instructions (Addendum)

## 2022-03-19 NOTE — Progress Notes (Signed)
CC: Establish Care  HPI:  [name] is a 26 y.o. person, with a PMH noted below, who presents to the clinic to establish care. To see the management of their acute and chronic conditions, please see the A&P note under the Encounters tab.   Past Medical History:  Diagnosis Date   Anxiety    Asthma    pt states she doesn't use inhaler much (04/10/19)   Depression    Gonorrhea    Heart murmur    when younger   Vaginal Pap smear, abnormal    Review of Systems:   Review of Systems  Constitutional:  Positive for malaise/fatigue. Negative for chills, fever and weight loss.  Cardiovascular:  Negative for chest pain and palpitations.  Gastrointestinal:  Negative for abdominal pain, blood in stool, constipation, diarrhea, melena, nausea and vomiting.  Genitourinary:  Negative for dysuria, flank pain, frequency, hematuria and urgency.       Increase in menstrual bleeding   Musculoskeletal:  Negative for myalgias.  Skin:  Negative for itching and rash.  Psychiatric/Behavioral:  Positive for depression. Negative for hallucinations, memory loss, substance abuse and suicidal ideas. The patient has insomnia. The patient is not nervous/anxious.     Physical Exam:  Vitals:   03/18/22 0955  BP: 123/86  Pulse: 87  Temp: 98.1 F (36.7 C)  TempSrc: Oral  SpO2: 100%  Weight: 167 lb 11.2 oz (76.1 kg)  Height: 5\' 3"  (1.6 m)   Physical Exam Constitutional:      General: She is not in acute distress.    Appearance: Normal appearance. She is not ill-appearing, toxic-appearing or diaphoretic.  Cardiovascular:     Rate and Rhythm: Normal rate and regular rhythm.     Pulses: Normal pulses.     Heart sounds: Normal heart sounds. No murmur heard.   No friction rub. No gallop.  Pulmonary:     Effort: Pulmonary effort is normal.     Breath sounds: Normal breath sounds. No wheezing, rhonchi or rales.  Neurological:     Mental Status: She is alert and oriented to person, place, and  time.  Psychiatric:        Mood and Affect: Mood normal.        Behavior: Behavior normal.     Assessment & Plan:   See Encounters Tab for problem based charting.  Patient discussed with Dr.  Allergic reaction to drug, initial encounter Patient presents to the [facility] today to establish care and for ED follow up after having an allergic reaction to one of her medications. Patient states that she has been taking Zoloft (for 2 years), Phentermine (since April 2023), and has just started an OCP Notrel (April 2023).   She states that the day of her UC visit and admission to [facility], she was in her usual state. She states that she took her medications as usual, but after several minutes began to feel pruritic and had "welts" appear. She took benadryl which helped alleviate the pruritis. She also began to notice her throat having the sensation of "closing off" and was having difficulty handling her secretions. She went to the UC and was given decadron, with little change in symptoms. She was then given two rounds of epi with alleviation in her symptoms. On review of the chart from her ED/admission, her vitals were stable, she had no urticaria, but did have difficulty speaking. She was observed overnight with no incident. When she was at home she denied any dizziness  or palpitations.   Patient denied any new foods. She states the only difference in her typical medications was that her OCP had a different color pill that she took from the other pills in her packet.   A/P:  Patient presents with a reaction likely 2/2 to the placebo pill from her newly prescribed OCP. There are no notes from the UC with vitals. She was tachycardic when she had an EKG with HR in the 120s, but when seen by the ED provider, pulse was in the 70s, with an unremarkable physical exam. Will refer to allergy to further evaluation.  - Refer to allergy     Depression Patient presents today with concerns for depression. She  states that her symptoms started when she had her first child, Andrea Jenkins who is present in the room, and proceeded to worsen after each subsequent child. She states after her second child she was started on Zoloft (2021). She states that after her second child she had thoughts of SI with thought of "taking them with me" but never acted on these thoughts. Her Zoloft had been increased to 100 mg, but does not feel that this has helped. She endorses fatigue, insomnia, little joy in favored activities, and decreased appetite.   She does endorse that at times she has feelings of being able to "take on the world" with rapid thoughts and further decrease in sleep. She additionally notes that when this occurs she does spend more money on items that she does not necessarily need. These episodes appear to only last several hours and does not extend to more than 5 days per week. She notes that her sister has had a "mental break" and that her brother is thought to have Bipolar disorder, but there has been no formal diagnosis that she is aware. She denies SI or HI today.   A/P: Patient presents with post partem depression. She is currently on Zoloft 100 mg and would like to explore other options for her medications. Given her symptoms of elevated mood, we discussed that she may have Depression with mixed fevers vs Bipolar 2 disorder, and that we should have further evaluation by psychiatry. We discussed that [facility] has a walk in clinic that is available until 3 pm daily, which she is interested in seeing. She will go to their walk in clinic tomorrow. - Continue Zoloft 100 mg Daily  - Patient given [facility] information

## 2022-03-19 NOTE — Assessment & Plan Note (Signed)
Patient presents today with concerns for having difficulty losing "baby weight." She states that she just recently started phentermine, prescribed by her OBGYN, for which she has had for approximately one month. We discussed diet and exercised. Patient is currently not interested in dietician referral at this time.   A/P:  Patient presents with a BMI of 29. She is currently one month in to a 3 month trial of phentermine, we discussed increasing aerobic exercise, which she is agreeable to, and to attempt to increase diet. Currently, she does not have any preconditions risk to qualify for GLP-1A at this time. Will continue with current therapy and reassess in 2 months.  - Continue current regimen - Follow up in 2 months

## 2022-03-19 NOTE — Assessment & Plan Note (Addendum)
Patient presents with fatigue, which appears to be secondary to her depression as the symptoms started with onset of her post partem depression. However the patient has also had difficulty with weight loss, and has been having menorrhagia likely 2/2 to her IUD. Will rule out 2/2 contributors to her fatigue.  - CBC w/ iron studies, ferritin  - TSH - Vitamin B12 - Optimize depression regimen, [facility] information provided

## 2022-03-19 NOTE — Assessment & Plan Note (Signed)
Patient presents today with concerns for depression. She states that her symptoms started when she had her first child, [name] who is present in the room, and proceeded to worsen after each subsequent child. She states after her second child she was started on Zoloft (2021). She states that after her second child she had thoughts of SI with thought of "taking them with me" but never acted on these thoughts. Her Zoloft had been increased to 100 mg, but does not feel that this has helped. She endorses fatigue, insomnia, little joy in favored activities, and decreased appetite.   She does endorse that at times she has feelings of being able to "take on the world" with rapid thoughts and further decrease in sleep. She additionally notes that when this occurs she does spend more money on items that she does not necessarily need. These episodes appear to only last several hours and does not extend to more than 5 days per week. She notes that her sister has had a "mental break" and that her brother is thought to have Bipolar disorder, but there has been no formal diagnosis that she is aware. She denies SI or HI today.   A/P: Patient presents with post partem depression. She is currently on Zoloft 100 mg and would like to explore other options for her medications. Given her symptoms of elevated mood, we discussed that she may have Depression with mixed fevers vs Bipolar 2 disorder, and that we should have further evaluation by psychiatry. We discussed that [facility] has a walk in clinic that is available until 3 pm daily, which she is interested in seeing. She will go to their walk in clinic tomorrow. - Continue Zoloft 100 mg Daily  - Patient given [facility] information

## 2022-03-19 NOTE — Assessment & Plan Note (Signed)
>>  ASSESSMENT AND PLAN FOR OVERWEIGHT (BMI 25.0-29.9) WRITTEN ON 03/19/2022 11:41 AM BY Dolan Amen, MD  Patient presents today with concerns for having difficulty losing "baby weight." She states that she just recently started phentermine, prescribed by her OBGYN, for which she has had for approximately one month. We discussed diet and exercised. Patient is currently not interested in dietician referral at this time.   A/P:  Patient presents with a BMI of 29. She is currently one month in to a 3 month trial of phentermine, we discussed increasing aerobic exercise, which she is agreeable to, and to attempt to increase diet. Currently, she does not have any preconditions risk to qualify for GLP-1A at this time. Will continue with current therapy and reassess in 2 months.  - Continue current regimen - Follow up in 2 months

## 2022-03-19 NOTE — ED Triage Notes (Signed)
Pt presents to [facility] seeking a mental health evaluation due to worsening depression symptoms. Pt reports lack of sleep, crying spells and anger outbursts. Pt states "I have not been good for a while". Pt states " I think I might be bipolar, I have high high's and low lows and my mind is racing constantly. Pt reports being diagnosed with depression/anxiety and reports being prescribed zoloft daily, but states that the medication "is not working". Pt reports that she does not have a psychiatrist, or therapist at the moment. Pt states that her OBGYN prescribed her medication. Pt denies SI at the moment. Pt states " I just need to get on the right meds and the right diagnosis".Pt denies NSSIB and AVH.

## 2022-03-19 NOTE — ED Provider Notes (Signed)
Behavioral Health Urgent Care Medical Screening Exam  Patient Name: Andrea Jenkins MRN: [id number] Date of Evaluation: 03/19/22 Diagnosis:  Final diagnoses:  Mood swings    History of Present illness: Andrea Jenkins is a 26 y.o. female patient who presents to the [facility] behavioral health urgent care voluntary with a chief complaint of seeking a mental health evaluation due to worsening depressive symptoms.  Patient seen and evaluated face-to-face by this provider, and chart reviewed. On evaluation, patient is alert and oriented x4. Her thought process is logical and relevant. Her speech is clear and coherent. Her mood is depressed and affect is congruent.   Patient states, "I haven't been good for four years now. I haven't felt like my self since I had my first child four years ago. She states that she experienced postpartum depression after each child,  who are now 34, 37, and 46 years old. She states that after she had her 76 year old son she experienced thoughts of wanting to hurt herself and her children. She states that at that time, two years ago, she was started on Zoloft by her OBGYN for postpartum depression.    She reports having mood swings of highs and lows for the past 3 years. She states that on average her highs last 2 to 5 days and then she experiences a low state. She states that last week she felt like she was on a high for six days. She described the episode as feeling like she had a million dollars to blow. Spent money she did not have on shopping. Difficulty sleeping. Sleeping 3 hours per night and feeling tired the next day. Feeling like her brain is running at night. Risky sexual behaviors. Having a lot of sex with 2 partners and is considering another partner.  She states that she currently feels depressed. She states that she has been in a depressive states since her high mood ended on Sunday. She describes her depressive symptoms as "what is life," sadness,  hopelessness, decreased energy level, crying spells, irritability, anhedonia, and low self-esteem.   She denies suicidal or homicidal ideations. She denies auditory or visual hallucinations. There is no objective evidence that the patient is currently responding to internal or external stimuli.  She reports increased alcohol use within the past month. She reports drinking 1 or 2 smirnoffs every day during the week. She reports drinking 7 shots of liquor per day on the weekends. She denies using illicit drugs. She states that she resides with her mother, brother, brother's girlfriend, sister and her three children. She does not have outpatient psychiatric services. She states that she stopped taking the Zoloft because her OBGYN no longer refills the medications.   Patient states that she is interested in being evaluated for bipolar and medications for mood. I discussed with the patient following up here at the [facility] outpatient for medication management and therapy. I discussed with the patient open access hours which includes Mondays, Wednesdays, and Fridays between 8 a.m. to 11 am. Patient advised to attend open access hours at 7:30 am. Patient verbalizes understanding and agrees to the stated plan. Patient was also provided with outpatient resources for [facility] and [facility] [place].   Psychiatric Specialty Exam  Presentation  General Appearance:Appropriate for Environment  Eye Contact:Fair  Speech:Clear and Coherent  Speech Volume:Normal  Handedness:No data recorded  Mood and Affect  Mood:Depressed  Affect:Congruent   Thought Process  Thought Processes:Coherent; Goal Directed; Linear  Descriptions of Associations:Intact  Orientation:Full (Time, Place and Person)  Thought Content:Logical    Hallucinations:None  Ideas of Reference:None  Suicidal Thoughts:No  Homicidal Thoughts:No   Sensorium  Memory:Immediate Fair; Remote Fair; Recent  Fair  Judgment:Fair  Insight:Fair   Executive Functions  Concentration:Fair  Attention Span:Fair  Recall:Fair  Fund of Knowledge:Fair  Language:Fair   Psychomotor Activity  Psychomotor Activity:Normal   Assets  Assets:Communication Skills; Desire for Improvement; Housing; Leisure Time; Physical Health; Social Support; English as a second language teacher; Vocational/Educational   Sleep  Sleep:Poor  Number of hours: 3   No data recorded  Physical Exam: Physical Exam HENT:     Head: Normocephalic.     Nose: Nose normal.  Eyes:     Conjunctiva/sclera: Conjunctivae normal.  Cardiovascular:     Rate and Rhythm: Normal rate.  Musculoskeletal:        General: Normal range of motion.     Cervical back: Normal range of motion.  Neurological:     Mental Status: She is alert and oriented to person, place, and time.   Review of Systems  Constitutional: Negative.   HENT: Negative.    Eyes: Negative.   Respiratory: Negative.    Cardiovascular: Negative.   Gastrointestinal: Negative.   Genitourinary: Negative.   Musculoskeletal: Negative.   Skin: Negative.   Neurological: Negative.   Endo/Heme/Allergies: Negative.   Blood pressure 122/90, pulse 94, temperature 98.2 F (36.8 C), temperature source Oral, resp. rate 18, last menstrual period 03/18/2022, SpO2 100 %, currently breastfeeding. There is no height or weight on file to calculate BMI.  Musculoskeletal: Strength & Muscle Tone: within normal limits Gait & Station: normal Patient leans: N/A   [facility] MSE Discharge Disposition for Follow up and Recommendations: Based on my evaluation the patient does not appear to have an emergency medical condition and can be discharged with resources and follow up care in outpatient services for Medication Management, Individual Therapy, and Group Therapy  Follow up recommendations for outpatient psychiatry:  Follow-up Information     [facility].   Specialty:  Urgent Care Why: Walk-in hours for open access for psychiatry are Mondays, Wednesdays and Fridays 8 am to 11 pm. Please arrive at 7:30 am. Open access for therapy are Mondays, Tuesdays, and Wednesdays from 8:00 am to 11:00 pm. Please arrive at 7:30 am Contact information: [address] [place] [address] [phone]        [facility].   Why: Open Access, Outpatient Therapy, Assertive Community Treatment Team (ACTT), Medication Management Offer services for no payment, sliding fee scale, Accepts Aetna, Cigna, [place], Medicaid, Medicare, [place] Health Choice, Self Pay, [facility], [facility], Walk in Monday- Friday 8am-3pm Contact information: [address]  [address] [place] [address] [phone]         Call  [facility]   Specialty: Professional Counselor Why: Schedule an appointment with psychiatry and counselor for follow up Contact information: [facility] [place] [address] [place] [address] [phone]                 Layla Barter, NP 03/19/2022, 5:10 PM

## 2022-03-19 NOTE — Assessment & Plan Note (Signed)
Patient presents to the clinic with concerns of increased bleeding over the past year. Patient states that she is currently has an IUD, and was recently taking OCPs until having a likely allergic reaction to them earlier this month, which was inserted approximately a year ago. She would like to consider other alternatives as she is wondering if her symptoms are due to IUD. She states that her IUD is not a "hormone" one. She notes that she does feel more fatigued this past year.   A/P:  Patient presents to clinic with menorrhagia likely 2/2 to a presumed copper IUD. Patient is interested in removing her IUD and replacing with another IUD. She does not want to try OCPs after her recent possible anaphylactic event to an ingredient in her OCPs. We discussed that the [facility] does not replace or remove IUDs in house.  - Iron studies and ferritin - CBC - Patient instructed to follow with OBGYN for further discussion

## 2022-03-19 NOTE — Assessment & Plan Note (Signed)
Patient presents to the [facility] today to establish care and for ED follow up after having an allergic reaction to one of her medications. Patient states that she has been taking Zoloft (for 2 years), Phentermine (since April 2023), and has just started an OCP Notrel (April 2023).   She states that the day of her UC visit and admission to [facility], she was in her usual state. She states that she took her medications as usual, but after several minutes began to feel pruritic and had "welts" appear. She took benadryl which helped alleviate the pruritis. She also began to notice her throat having the sensation of "closing off" and was having difficulty handling her secretions. She went to the UC and was given decadron, with little change in symptoms. She was then given two rounds of epi with alleviation in her symptoms. On review of the chart from her ED/admission, her vitals were stable, she had no urticaria, but did have difficulty speaking. She was observed overnight with no incident. When she was at home she denied any dizziness or palpitations.   Patient denied any new foods. She states the only difference in her typical medications was that her OCP had a different color pill that she took from the other pills in her packet.   A/P:  Patient presents with a reaction likely 2/2 to the placebo pill from her newly prescribed OCP. There are no notes from the UC with vitals. She was tachycardic when she had an EKG with HR in the 120s, but when seen by the ED provider, pulse was in the 70s, with an unremarkable physical exam. Will refer to allergy to further evaluation.  - Refer to allergy

## 2022-03-21 ENCOUNTER — Ambulatory Visit
Admission: EM | Admit: 2022-03-21 | Discharge: 2022-03-21 | Disposition: A | Discharge status: Home / Self Care | Payer: Medicaid Other | Attending: Emergency Medicine | Admitting: Emergency Medicine

## 2022-03-21 DIAGNOSIS — Z202 Contact with and (suspected) exposure to infections with a predominantly sexual mode of transmission: Secondary | ICD-10-CM | POA: Diagnosis not present

## 2022-03-21 DIAGNOSIS — K644 Residual hemorrhoidal skin tags: Secondary | ICD-10-CM | POA: Diagnosis not present

## 2022-03-21 LAB — POCT URINE PREGNANCY: Preg Test, Ur: NEGATIVE

## 2022-03-21 MED ORDER — HYDROCORTISONE (PERIANAL) 2.5 % EX CREA: 1.0000 "application " | TOPICAL_CREAM | Freq: Two times a day (BID) | CUTANEOUS | 0 refills | Status: DC

## 2022-03-21 NOTE — Discharge Instructions (Signed)
Treat your hemorrhoid, please apply Anusol rectal cream twice daily as directed.  The results of your STD testing today will be made available to you once they are complete, this typically takes 3 to 5 days.  They will initially be posted to your MyChart and, if any of your results are abnormal, you will receive a phone call with those results along with further instructions regarding treatment and any prescriptions, if needed.    If you have not had complete resolution of your symptoms after completing treatment, please return for repeat evaluation.   Thank you for visiting urgent care today.  I appreciate the opportunity to participate in your care.

## 2022-03-21 NOTE — ED Triage Notes (Signed)
Pt presents for STD Testing after possible exposure a few days ago; pt states she has a painful bump on her vagina now.

## 2022-03-21 NOTE — ED Provider Notes (Signed)
[facility]    CSN: [id number] Arrival date & time: 03/21/22  1524    HISTORY   Chief Complaint  Patient presents with   STD Testing    HPI Andrea Jenkins is a 26 y.o. female. Patient presents to urgent care today requesting STD testing after possible exposure that occurred a few days ago.  Patient states partner's condom came off.  Patient also complains of a painful mass around her anus that she states she has never experienced before.  Patient states she has not tried applying anything to the lesion.  Patient states present for 2 to 3 days.  Patient endorses a history of mild constipation.  The history is provided by the patient.  Past Medical History:  Diagnosis Date   Anxiety    Asthma    pt states she doesn't use inhaler much (04/10/19)   Depression    Gonorrhea    Heart murmur    when younger   Vaginal Pap smear, abnormal    Patient Active Problem List   Diagnosis Date Noted   Fatigue 03/19/2022   Overweight (BMI 25.0-29.9) 03/19/2022   Depression    Menorrhagia    Allergic reaction to drug, initial encounter 03/06/2022   Acute cholecystitis 10/30/2021   Anxiety    Alpha thalassemia silent carrier 10/24/2019   LGSIL on Pap smear of cervix 10/20/2016   Past Surgical History:  Procedure Laterality Date   CATARACT EXTRACTION     CHOLECYSTECTOMY N/A 10/30/2021   Procedure: LAPAROSCOPIC CHOLECYSTECTOMY;  Surgeon: Dwan Bolt, MD;  Location: [facility];  Service: General;  Laterality: N/A;   WISDOM TOOTH EXTRACTION     WRIST SURGERY     OB History     Gravida  3   Para  3   Term  3   Preterm  0   AB  0   Living  3      SAB  0   IAB  0   Ectopic  0   Multiple  0   Live Births  3          Home Medications    Prior to Admission medications   Medication Sig Start Date End Date Taking? Authorizing Provider  diphenhydrAMINE (BENADRYL) 25 MG tablet Take 25 mg by mouth every 6 (six) hours as needed (for allergic reactions).     [provider]  EPINEPHrine (EPIPEN 2-PAK) 0.3 mg/0.3 mL IJ SOAJ injection Inject 0.3 mg into the muscle as needed for anaphylaxis. 03/06/22   Amin, Jeanella Flattery, MD  famotidine (PEPCID) 20 MG tablet Take 1 tablet (20 mg total) by mouth 2 (two) times daily for 5 days. 03/06/22 03/11/22  Amin, Jeanella Flattery, MD  loratadine (CLARITIN) 10 MG tablet Take 1 tablet (10 mg total) by mouth daily. 02/16/22   Shelly Bombard, MD  phentermine 37.5 MG capsule Take 1 capsule (37.5 mg total) by mouth every morning. 02/16/22   Shelly Bombard, MD  sertraline (ZOLOFT) 50 MG tablet Take 50 mg by mouth in the morning. 02/15/22   [provider]   Family History Family History  Problem Relation Age of Onset   Hypertension Mother    Miscarriages / [place] Mother    Hypertension Maternal Grandmother    Asthma Maternal Grandmother    Diabetes Maternal Grandmother    Vision loss Maternal Grandmother    Mental illness Brother    Cancer Maternal Aunt    Mental illness Sister    Heart  disease Neg Hx    Social History Social History   Tobacco Use   Smoking status: Former    Packs/day: 0.25    Types: Cigars, Cigarettes    Quit date: 03/11/2017    Years since quitting: 5.0    Passive exposure: Current   Smokeless tobacco: Never  Vaping Use   Vaping Use: Never used  Substance Use Topics   Alcohol use: Yes    Comment: occ   Drug use: Not Currently    Types: Marijuana    Comment: occasioinally   Allergies   Nortrel 1-35 (21) [norethin-eth estrad triphasic] and Peach [prunus persica]  Review of Systems Review of Systems Pertinent findings noted in history of present illness.   Physical Exam Triage Vital Signs ED Triage Vitals  Enc Vitals Group     BP 08/21/21 0827 (!) 147/82     Pulse Rate 08/21/21 0827 72     Resp 08/21/21 0827 18     Temp 08/21/21 0827 98.3 F (36.8 C)     Temp Source 08/21/21 0827 Oral     SpO2 08/21/21 0827 98 %     Weight --      Height --       Head Circumference --      Peak Flow --      Pain Score 08/21/21 0826 5     Pain Loc --      Pain Edu? --      Excl. in [place]? --   No data found.  Updated Vital Signs BP 130/81 (BP Location: Left Arm)   Pulse 94   Temp 97.8 F (36.6 C) (Oral)   Resp 17   LMP 03/18/2022   SpO2 98%   Physical Exam Vitals and nursing note reviewed.  Constitutional:      General: She is not in acute distress.    Appearance: Normal appearance. She is not ill-appearing.  HENT:     Head: Normocephalic and atraumatic.  Eyes:     General: Lids are normal.        Right eye: No discharge.        Left eye: No discharge.     Extraocular Movements: Extraocular movements intact.     Conjunctiva/sclera: Conjunctivae normal.     Right eye: Right conjunctiva is not injected.     Left eye: Left conjunctiva is not injected.  Neck:     Trachea: Trachea and phonation normal.  Cardiovascular:     Rate and Rhythm: Normal rate and regular rhythm.     Pulses: Normal pulses.     Heart sounds: Normal heart sounds. No murmur heard.   No friction rub. No gallop.  Pulmonary:     Effort: Pulmonary effort is normal. No accessory muscle usage, prolonged expiration or respiratory distress.     Breath sounds: Normal breath sounds. No stridor, decreased air movement or transmitted upper airway sounds. No decreased breath sounds, wheezing, rhonchi or rales.  Chest:     Chest wall: No tenderness.  Genitourinary:      Comments: Patient politely declines pelvic exam today, patient provided a vaginal swab for testing. Musculoskeletal:        General: Normal range of motion.     Cervical back: Normal range of motion and neck supple. Normal range of motion.  Lymphadenopathy:     Cervical: No cervical adenopathy.  Skin:    General: Skin is warm and dry.     Findings: No erythema or rash.  Neurological:  General: No focal deficit present.     Mental Status: She is alert and oriented to person, place, and time.   Psychiatric:        Mood and Affect: Mood normal.        Behavior: Behavior normal.    Visual Acuity Right Eye Distance:   Left Eye Distance:   Bilateral Distance:    Right Eye Near:   Left Eye Near:    Bilateral Near:     UC Couse / Diagnostics / Procedures:    EKG  Radiology No results found.  Procedures Procedures (including critical care time)  UC Diagnoses / Final Clinical Impressions(s)   I have reviewed the triage vital signs and the nursing notes.  Pertinent labs & imaging results that were available during my care of the patient were reviewed by me and considered in my medical decision making (see chart for details).    Final diagnoses:  External hemorrhoid  Potential exposure to STD   STD screening was performed, patient advised that the results be posted to their MyChart and if any of the results are positive, they will be notified by phone, further treatment will be provided as indicated based on results of STD screening.  Patient advised to begin Anusol rectal cream for hemorrhoid and to follow-up with either her gynecologist or primary care provider if not improved in 2 weeks.   Return precautions advised.  Drug allergies reviewed, all questions addressed.     ED Prescriptions     Medication Sig Dispense Auth. Provider   hydrocortisone (ANUSOL-HC) 2.5 % rectal cream Place 1 application. rectally 2 (two) times daily. 30 g Lynden Oxford Scales, PA-C      PDMP not reviewed this encounter.  Pending results:  Labs Reviewed  POCT URINE PREGNANCY  CERVICOVAGINAL ANCILLARY ONLY    Medications Ordered in UC: Medications - No data to display  Disposition Upon Discharge:  Condition: stable for discharge home  Patient presented with concern for an acute illness with associated systemic symptoms and significant discomfort requiring urgent management. In my opinion, this is a condition that a prudent lay person (someone who possesses an average  knowledge of health and medicine) may potentially expect to result in complications if not addressed urgently such as respiratory distress, impairment of bodily function or dysfunction of bodily organs.   As such, the patient has been evaluated and assessed, work-up was performed and treatment was provided in alignment with urgent care protocols and evidence based medicine.  Patient/parent/caregiver has been advised that the patient may require follow up for further testing and/or treatment if the symptoms continue in spite of treatment, as clinically indicated and appropriate.  Routine symptom specific, illness specific and/or disease specific instructions were discussed with the patient and/or caregiver at length.  Prevention strategies for avoiding STD exposure were also discussed.  The patient will follow up with their current PCP if and as advised. If the patient does not currently have a PCP we will assist them in obtaining one.   The patient may need specialty follow up if the symptoms continue, in spite of conservative treatment and management, for further workup, evaluation, consultation and treatment as clinically indicated and appropriate.  Patient/parent/caregiver verbalized understanding and agreement of plan as discussed.  All questions were addressed during visit.  Please see discharge instructions below for further details of plan.  Discharge Instructions:   Discharge Instructions      Treat your hemorrhoid, please apply Anusol rectal cream twice daily as  directed.  The results of your STD testing today will be made available to you once they are complete, this typically takes 3 to 5 days.  They will initially be posted to your MyChart and, if any of your results are abnormal, you will receive a phone call with those results along with further instructions regarding treatment and any prescriptions, if needed.    If you have not had complete resolution of your symptoms after  completing treatment, please return for repeat evaluation.   Thank you for visiting urgent care today.  I appreciate the opportunity to participate in your care.       This office note has been dictated using Museum/gallery curator.  Unfortunately, and despite my best efforts, this method of dictation can sometimes lead to occasional typographical or grammatical errors.  I apologize in advance if this occurs.      Lynden Oxford Scales, [place] 03/22/22 [phone]

## 2022-03-23 ENCOUNTER — Other Ambulatory Visit (HOSPITAL_COMMUNITY)
Admission: RE | Admit: 2022-03-23 | Discharge: 2022-03-23 | Disposition: A | Discharge status: Home / Self Care | Payer: Medicaid Other | Source: Ambulatory Visit | Attending: Obstetrics | Admitting: Obstetrics

## 2022-03-23 ENCOUNTER — Ambulatory Visit (INDEPENDENT_AMBULATORY_CARE_PROVIDER_SITE_OTHER): Payer: Medicaid Other | Admitting: Obstetrics

## 2022-03-23 ENCOUNTER — Encounter: Payer: Self-pay | Admitting: Obstetrics

## 2022-03-23 VITALS — BP 129/83 | HR 73 | Ht 63.5 in | Wt 169.0 lb

## 2022-03-23 DIAGNOSIS — Z30432 Encounter for removal of intrauterine contraceptive device: Secondary | ICD-10-CM | POA: Diagnosis not present

## 2022-03-23 DIAGNOSIS — N898 Other specified noninflammatory disorders of vagina: Secondary | ICD-10-CM | POA: Diagnosis not present

## 2022-03-23 DIAGNOSIS — Z113 Encounter for screening for infections with a predominantly sexual mode of transmission: Secondary | ICD-10-CM | POA: Diagnosis not present

## 2022-03-23 DIAGNOSIS — Z01419 Encounter for gynecological examination (general) (routine) without abnormal findings: Secondary | ICD-10-CM | POA: Diagnosis not present

## 2022-03-23 DIAGNOSIS — T8332XA Displacement of intrauterine contraceptive device, initial encounter: Secondary | ICD-10-CM | POA: Diagnosis not present

## 2022-03-23 DIAGNOSIS — N939 Abnormal uterine and vaginal bleeding, unspecified: Secondary | ICD-10-CM | POA: Diagnosis not present

## 2022-03-23 NOTE — Progress Notes (Signed)
Subjective:        Andrea Jenkins is a 26 y.o. female here for a routine exam.  Current complaints: Vaginal discharge.  New sexual partner and possible STD exposure..  Also c/o irregular vaginal bleeding.  Contraception:  IUD  Personal health questionnaire:  Is patient Ashkenazi Jewish, have a family history of breast and/or ovarian cancer: no Is there a family history of uterine cancer diagnosed at age < 1850, gastrointestinal cancer, urinary tract cancer, family member who is a Personnel officerLynch syndrome-associated carrier: no Is the patient overweight and hypertensive, family history of diabetes, personal history of gestational diabetes, preeclampsia or PCOS: no Is patient over 455, have PCOS,  family history of premature CHD under age 25, diabetes, smoke, have hypertension or peripheral artery disease:  no At any time, has a partner hit, kicked or otherwise hurt or frightened you?: no Over the past 2 weeks, have you felt down, depressed or hopeless?: no Over the past 2 weeks, have you felt little interest or pleasure in doing things?:no   Gynecologic History Patient's last menstrual period was 03/18/2022. Contraception: IUD Last Pap: 2022. Results were: normal Last mammogram: n/a. Results were: n/a  Obstetric History OB History  Gravida Para Term Preterm AB Living  3 3 3  0 0 3  SAB IAB Ectopic Multiple Live Births  0 0 0 0 3    # Outcome Date GA Lbr Len/2nd Weight Sex Delivery Anes PTL Lv  3 Term 03/10/21 8130w4d 06:27 / 00:13 6 lb 10.5 oz (3.019 kg) F Vag-Spont EPI  LIV  2 Term 12/04/19 3138w0d 02:45 / 00:20 7 lb 0.9 oz (3.201 kg) M Vag-Spont EPI  LIV  1 Term 01/18/18 4816w6d 07:57 / 00:18 7 lb 6.7 oz (3.365 kg) F Vag-Spont EPI  LIV    Past Medical History:  Diagnosis Date   Anxiety    Asthma    pt states she doesn't use inhaler much (04/10/19)   Depression    Gonorrhea    Heart murmur    when younger   Vaginal Pap smear, abnormal     Past Surgical History:  Procedure Laterality  Date   CATARACT EXTRACTION     CHOLECYSTECTOMY N/A 10/30/2021   Procedure: LAPAROSCOPIC CHOLECYSTECTOMY;  Surgeon: Fritzi MandesAllen, Shelby L, MD;  Location: [facility];  Service: General;  Laterality: N/A;   WISDOM TOOTH EXTRACTION     WRIST SURGERY       Current Outpatient Medications:    diphenhydrAMINE (BENADRYL) 25 MG tablet, Take 25 mg by mouth every 6 (six) hours as needed (for allergic reactions)., Disp: , Rfl:    hydrocortisone (ANUSOL-HC) 2.5 % rectal cream, Place 1 application. rectally 2 (two) times daily., Disp: 30 g, Rfl: 0   phentermine 37.5 MG capsule, Take 1 capsule (37.5 mg total) by mouth every morning., Disp: 30 capsule, Rfl: 2   EPINEPHrine (EPIPEN 2-PAK) 0.3 mg/0.3 mL IJ SOAJ injection, Inject 0.3 mg into the muscle as needed for anaphylaxis., Disp: 1 each, Rfl: 1   famotidine (PEPCID) 20 MG tablet, Take 1 tablet (20 mg total) by mouth 2 (two) times daily for 5 days., Disp: 10 tablet, Rfl: 0   loratadine (CLARITIN) 10 MG tablet, Take 1 tablet (10 mg total) by mouth daily. (Patient not taking: Reported on 03/23/2022), Disp: 30 tablet, Rfl: 11   sertraline (ZOLOFT) 50 MG tablet, Take 50 mg by mouth in the morning. (Patient not taking: Reported on 03/23/2022), Disp: , Rfl:  Allergies  Allergen Reactions   Nortrel  1-35 (21) [Norethin-Eth Estrad Triphasic] Anaphylaxis   Peach [Prunus Persica] Hives and Itching    Social History   Tobacco Use   Smoking status: Former    Packs/day: 0.25    Types: Cigars, Cigarettes    Quit date: 03/11/2017    Years since quitting: 5.0    Passive exposure: Current   Smokeless tobacco: Never  Substance Use Topics   Alcohol use: Yes    Comment: occ    Family History  Problem Relation Age of Onset   Hypertension Mother    Miscarriages / Stillbirths Mother    Hypertension Maternal Grandmother    Asthma Maternal Grandmother    Diabetes Maternal Grandmother    Vision loss Maternal Grandmother    Mental illness Brother    Cancer Maternal Aunt     Mental illness Sister    Heart disease Neg Hx       Review of Systems  Constitutional: negative for fatigue and weight loss Respiratory: negative for cough and wheezing Cardiovascular: negative for chest pain, fatigue and palpitations Gastrointestinal: negative for abdominal pain and change in bowel habits Musculoskeletal:negative for myalgias Neurological: negative for gait problems and tremors Behavioral/Psych: negative for abusive relationship, depression Endocrine: negative for temperature intolerance    Genitourinary:negative for abnormal menstrual periods, genital lesions, hot flashes, sexual problems and vaginal discharge Integument/breast: negative for breast lump, breast tenderness, nipple discharge and skin lesion(s)    Objective:       BP 129/83   Pulse 73   Ht 5' 3.5" (1.613 m)   Wt 169 lb (76.7 kg)   LMP 03/18/2022   BMI 29.47 kg/m  General:   Alert and no distress  Skin:   no rash or abnormalities  Lungs:   clear to auscultation bilaterally  Heart:   regular rate and rhythm, S1, S2 normal, no murmur, click, rub or gallop  Breasts:   normal without suspicious masses, skin or nipple changes or axillary nodes  Abdomen:  normal findings: no organomegaly, soft, non-tender and no hernia  Pelvis:  External genitalia: normal general appearance Urinary system: urethral meatus normal and bladder without fullness, nontender Vaginal: normal without tenderness, induration or masses Cervix: normal appearance.  IUD shaft tip presenting at cervical os.  IUD removed ( see note ) Adnexa: normal bimanual exam Uterus: anteverted and non-tender, normal size   Lab Review Urine pregnancy test Labs reviewed yes Radiologic studies reviewed no  I have spent a total of 30 minutes of face-to-face time, excluding clinical staff time, reviewing notes and preparing to see patient, ordering tests and/or medications, and counseling the patient.   Assessment:    1. Encounter for  gynecological examination Rx: - Cytology - PAP( [facility])  2. Vaginal discharge Rx: - Cervicovaginal ancillary only  3. Screening for STD (sexually transmitted disease) Rx: - HIV Antibody (routine testing w rflx) - RPR - Hepatitis C antibody - Hepatitis B surface antigen  4. Abnormal uterine bleeding (AUB) - probably related to IUD  5. Malpositioned IUD, initial encounter  6. Encounter for IUD removal     Plan:    Education reviewed: calcium supplements, depression evaluation, low fat, low cholesterol diet, safe sex/STD prevention, self breast exams, and weight bearing exercise. Contraception: condoms. Follow up in: 1 year.    Orders Placed This Encounter  Procedures   HIV Antibody (routine testing w rflx)   Hepatitis B surface antigen   RPR   Hepatitis C antibody     Brock Bad, MD 03/23/2022  11:39 AM        GYNECOLOGY OFFICE PROCEDURE NOTE  Andrea Jenkins is a 26 y.o. [id number] here for Liletta IUD removal. No GYN concerns.  Last pap smear was on 04-08-2021 and was normal.  IUD Removal  Patient identified, informed consent performed, consent signed.  Patient was in the dorsal lithotomy position, normal external genitalia was noted.  A speculum was placed in the patient's vagina, normal discharge was noted, no lesions. The cervix was visualized, no lesions, no abnormal discharge.  The strings of the IUD were grasped and pulled using ring forceps. The IUD was removed in its entirety.   Patient tolerated the procedure well.    Patient will use condoms for contraception.   Routine preventative health maintenance measures emphasized.   Brock Bad, MD, FACOG Obstetrician & Gynecologist, [facility] for [facility], [facility], [place] 03/23/22

## 2022-03-23 NOTE — Progress Notes (Signed)
Reports heavy bleeding still with IUD. Menses 2-3 times a month Reports soaking a pad in less than an hour when bleeding heavy  Anaphylactic rxn to Nortrel OCP Has already tried nexplanon, other pills, depo, patch before.

## 2022-03-24 ENCOUNTER — Telehealth (HOSPITAL_COMMUNITY): Payer: Self-pay | Admitting: Emergency Medicine

## 2022-03-24 LAB — HEPATITIS B SURFACE ANTIGEN: Hepatitis B Surface Ag: NEGATIVE

## 2022-03-24 LAB — HIV ANTIBODY (ROUTINE TESTING W REFLEX): HIV Screen 4th Generation wRfx: NONREACTIVE

## 2022-03-24 LAB — CERVICOVAGINAL ANCILLARY ONLY
Bacterial Vaginitis (gardnerella): POSITIVE — AB
Bacterial Vaginitis (gardnerella): POSITIVE — AB
Candida Glabrata: NEGATIVE
Candida Glabrata: NEGATIVE
Candida Vaginitis: NEGATIVE
Candida Vaginitis: NEGATIVE
Chlamydia: NEGATIVE
Chlamydia: NEGATIVE
Comment: NEGATIVE
Comment: NEGATIVE
Comment: NEGATIVE
Comment: NEGATIVE
Comment: NEGATIVE
Comment: NORMAL
Comment: NEGATIVE
Comment: NEGATIVE
Comment: NEGATIVE
Comment: NEGATIVE
Comment: NEGATIVE
Comment: NORMAL
Neisseria Gonorrhea: NEGATIVE
Neisseria Gonorrhea: NEGATIVE
Trichomonas: NEGATIVE
Trichomonas: NEGATIVE

## 2022-03-24 LAB — RPR: RPR Ser Ql: NONREACTIVE

## 2022-03-24 LAB — HEPATITIS C ANTIBODY: Hep C Virus Ab: NONREACTIVE

## 2022-03-24 MED ORDER — HYDROCORTISONE (PERIANAL) 2.5 % EX CREA: 1.0000 "application " | TOPICAL_CREAM | Freq: Two times a day (BID) | CUTANEOUS | 0 refills | Status: DC

## 2022-03-24 MED ORDER — METRONIDAZOLE 500 MG PO TABS: 500.0000 mg | ORAL_TABLET | Freq: Two times a day (BID) | ORAL | 0 refills | Status: DC

## 2022-03-24 NOTE — Progress Notes (Signed)
Internal Medicine Clinic Attending ? ?Case discussed with Dr. Winters  At the time of the visit.  We reviewed the resident?s history and exam and pertinent patient test results.  I agree with the assessment, diagnosis, and plan of care documented in the resident?s note.  ?

## 2022-03-25 ENCOUNTER — Other Ambulatory Visit: Payer: Self-pay | Admitting: Obstetrics

## 2022-03-26 ENCOUNTER — Ambulatory Visit (INDEPENDENT_AMBULATORY_CARE_PROVIDER_SITE_OTHER): Payer: Medicaid Other | Admitting: Physician Assistant

## 2022-03-26 DIAGNOSIS — F411 Generalized anxiety disorder: Secondary | ICD-10-CM | POA: Diagnosis not present

## 2022-03-26 DIAGNOSIS — F5105 Insomnia due to other mental disorder: Secondary | ICD-10-CM

## 2022-03-26 DIAGNOSIS — F3162 Bipolar disorder, current episode mixed, moderate: Secondary | ICD-10-CM

## 2022-03-26 DIAGNOSIS — F99 Mental disorder, not otherwise specified: Secondary | ICD-10-CM | POA: Insufficient documentation

## 2022-03-26 MED ORDER — ARIPIPRAZOLE 5 MG PO TABS: 5.0000 mg | ORAL_TABLET | Freq: Every day | ORAL | 2 refills | Status: DC

## 2022-03-26 MED ORDER — TRAZODONE HCL 50 MG PO TABS
50.0000 mg | ORAL_TABLET | Freq: Every day | ORAL | 2 refills | Status: DC
Start: 2022-03-26 — End: 2022-07-31

## 2022-03-26 MED ORDER — HYDROXYZINE HCL 10 MG PO TABS: 10.0000 mg | ORAL_TABLET | Freq: Three times a day (TID) | ORAL | 2 refills | Status: DC | PRN

## 2022-03-26 NOTE — Progress Notes (Addendum)
Psychiatric Initial Adult Assessment   Patient Identification: Andrea Jenkins MRN:  [id number] Date of Evaluation:  03/29/2022 Referral Source: [facility] Chief Complaint:  No chief complaint on file.  Visit Diagnosis:    ICD-10-CM   1. Generalized anxiety disorder  F41.1 hydrOXYzine (ATARAX) 10 MG tablet    2. Insomnia due to other mental disorder  F51.05 traZODone (DESYREL) 50 MG tablet   F99     3. Bipolar disorder, current episode mixed, moderate (HCC)  F31.62 ARIPiprazole (ABILIFY) 5 MG tablet      History of Present Illness:    Leandra L. Ponzo is a 26 year old female with a past psychiatric history significant for anxiety and depression who presents to [facility] as a walk-in and to establish psychiatric care.  Patient reports that she presented to [facility] last week with a chief complaint of going through mental health issues.  After patient was assessed at [facility], patient was referred over to this facility for psychiatric care.  Today, patient reports that she has been experiencing highs and lows and crying for no reason.  She reports that she is not sleeping well and is having difficulty getting out of bed.  She has also noticed changing in her eating habits.  Patient states that her aforementioned symptoms are always experienced during her low moments.  Patient reports that she also gets agitated and irritable easily.  She states that she has also been engaging in a lot of sex for the past 2 to 3 weeks.  During her "highs" patient feels she can do anything and describes herself as feeling invincible.  During her high moments patient states that she feels like she is capable of building a house or becoming the president.  She also endorses financial extravagance stating that she frequently shops with money that she does not have.  Patient states that her "high" more of  yesterday and that she enjoys being in that state of mood.  Since experiencing her shift in her mood, patient states that she does not sleep well.  In regards to medications that she has used in the past, patient states that she has taken up to 100 mg of sertraline for the management of her mood.  She reports that she was taking 50 mg of sertraline most recently due to those prescriptions being available to her from her last provider.  Patient states that she has not been able to take her medication for roughly a week due to running out of the medication and being unable to reach out to her past provider to refill her prescription.  Patient denies a past history of hospitalization due to mental health.  She further denies past history of suicide attempt but states that she has had fleeting thoughts in the past.  Patient denies past history of self-harm.  A PHQ-9 screen was performed with the patient scoring a 26.  A GAD-7 screen was also performed with the patient scoring a 21.  Patient is alert and oriented x4, calm, cooperative, and fully engaged in conversation during the encounter.  Patient endorses suicidal ideations but denies having a specific plan in place.  Patient denies homicidal ideations but does endorse that people have a tendency to get on her nerves, especially rude people.  Patient active auditory or visual hallucinations but states that she occasionally sees shadows.  Patient does not appear to be responding to internal/external stimuli.  Patient endorses poor  sleep and receives on average 3 hours of sleep each night.  Patient endorses poor appetite and eats on average 1 meal a day.  Patient endorses alcohol consumption socially.  Patient denies tobacco use but states that she does engage in [place].  Patient denies illicit drug use but states that she used to smoke marijuana 5 years ago.  Associated Signs/Symptoms: Depression Symptoms:  depressed mood, anhedonia, insomnia, psychomotor  agitation, psychomotor retardation, fatigue, feelings of worthlessness/guilt, difficulty concentrating, hopelessness, impaired memory, recurrent thoughts of death, suicidal thoughts without plan, anxiety, panic attacks, loss of energy/fatigue, disturbed sleep, weight loss, weight gain, decreased labido, decreased appetite, (Hypo) Manic Symptoms:  Delusions, Distractibility, Elevated Mood, Flight of Ideas, Community education officer, Grandiosity, Hallucinations, Impulsivity, Irritable Mood, Labiality of Mood, Sexually Inapproprite Behavior, Anxiety Symptoms:  Agoraphobia, Excessive Worry, Panic Symptoms, Obsessive Compulsive Symptoms:   Cleaning excessively, Social Anxiety, Specific Phobias, Psychotic Symptoms:  Delusions, Hallucinations: Visual Paranoia, PTSD Symptoms: Had a traumatic exposure:  Patient denies traumatic experience Had a traumatic exposure in the last month:  N/A Re-experiencing:  Intrusive Thoughts Nightmares Hypervigilance:  Yes Hyperarousal:  Difficulty Concentrating Emotional Numbness/Detachment Increased Startle Response Irritability/Anger Avoidance:  Decreased Interest/Participation Foreshortened Future  Past Psychiatric History:  Anxiety Depression  Previous Psychotropic Medications: Yes   Substance Abuse History in the last 12 months:  No.  Consequences of Substance Abuse: Medical Consequences:  Patient denies hospitalization due to substance abuse Legal Consequences:  Patient endorses a past charge due to larceny but denies that drugs were involved in the charge Family Consequences:  Patient denies family consequences Blackouts:  Patient denies past history of blacking out DT's: N/A Withdrawal Symptoms:   None  Past Medical History:  Past Medical History:  Diagnosis Date   Anxiety    Asthma    pt states she doesn't use inhaler much (04/10/19)   Depression    Gonorrhea    Heart murmur    when younger   Vaginal Pap smear,  abnormal     Past Surgical History:  Procedure Laterality Date   CATARACT EXTRACTION     CHOLECYSTECTOMY N/A 10/30/2021   Procedure: LAPAROSCOPIC CHOLECYSTECTOMY;  Surgeon: Dwan Bolt, MD;  Location: [facility];  Service: General;  Laterality: N/A;   WISDOM TOOTH EXTRACTION     WRIST SURGERY      Family Psychiatric History:  Older sister - Borderline personality disorder/Bipolar disorder  Family History:  Family History  Problem Relation Age of Onset   Hypertension Mother    Miscarriages / [place] Mother    Hypertension Maternal Grandmother    Asthma Maternal Grandmother    Diabetes Maternal Grandmother    Vision loss Maternal Grandmother    Mental illness Brother    Cancer Maternal Aunt    Mental illness Sister    Heart disease Neg Hx     Social History:   Social History   Socioeconomic History   Marital status: Single    Spouse name: Not on file   Number of children: 2   Years of education: 12   Highest education level: 12th grade  Occupational History   Occupation: [employer]   Tobacco Use   Smoking status: Former    Packs/day: 0.25    Types: Cigars, Cigarettes    Quit date: 03/11/2017    Years since quitting: 5.0    Passive exposure: Current   Smokeless tobacco: Never  Vaping Use   Vaping Use: Never used  Substance and Sexual Activity   Alcohol use: Yes  Comment: occ   Drug use: Not Currently    Types: Marijuana    Comment: occasioinally   Sexual activity: Not Currently    Partners: Male    Birth control/protection: I.U.D.    Comment: Pregnant   Other Topics Concern   Not on file  Social History Narrative   Not on file   Social Determinants of Health   Financial Resource Strain: High Risk   Difficulty of Paying Living Expenses: Very hard  Food Insecurity: Food Insecurity Present   Worried About Charity fundraiser in the Last Year: Often true   Arboriculturist in the Last Year: Often true  Transportation Needs: No Transportation Needs    Lack of Transportation (Medical): No   Lack of Transportation (Non-Medical): No  Physical Activity: Inactive   Days of Exercise per Week: 0 days   Minutes of Exercise per Session: 0 min  Stress: Stress Concern Present   Feeling of Stress : To some extent  Social Connections: Moderately Isolated   Frequency of Communication with Friends and Family: More than three times a week   Frequency of Social Gatherings with Friends and Family: More than three times a week   Attends Religious Services: 1 to 4 times per year   Active Member of [employer] or Organizations: No   Attends Archivist Meetings: Never   Marital Status: Never married    Additional Social History:  Patient is currently unemployed. Patient endorses housing and transportation. Patient endorses social support through her sister.  Allergies:   Allergies  Allergen Reactions   Nortrel 1-35 (21) [Norethin-Eth Estrad Triphasic] Anaphylaxis   Peach [Prunus Persica] Hives and Itching    Metabolic Disorder Labs: Lab Results  Component Value Date   HGBA1C 4.9 09/24/2019   MPG 111 04/14/2015   No results found for: PROLACTIN No results found for: CHOL, TRIG, HDL, CHOLHDL, VLDL, LDLCALC Lab Results  Component Value Date   TSH 1.430 03/18/2022    Therapeutic Level Labs: No results found for: LITHIUM No results found for: CBMZ No results found for: VALPROATE  Current Medications: Current Outpatient Medications  Medication Sig Dispense Refill   ARIPiprazole (ABILIFY) 5 MG tablet Take 1 tablet (5 mg total) by mouth daily. 30 tablet 2   hydrOXYzine (ATARAX) 10 MG tablet Take 1 tablet (10 mg total) by mouth 3 (three) times daily as needed. 75 tablet 2   traZODone (DESYREL) 50 MG tablet Take 1 tablet (50 mg total) by mouth at bedtime. 30 tablet 2   diphenhydrAMINE (BENADRYL) 25 MG tablet Take 25 mg by mouth every 6 (six) hours as needed (for allergic reactions).     EPINEPHrine (EPIPEN 2-PAK) 0.3 mg/0.3 mL IJ SOAJ  injection Inject 0.3 mg into the muscle as needed for anaphylaxis. 1 each 1   famotidine (PEPCID) 20 MG tablet Take 1 tablet (20 mg total) by mouth 2 (two) times daily for 5 days. 10 tablet 0   hydrocortisone (ANUSOL-HC) 2.5 % rectal cream Place 1 application. rectally 2 (two) times daily. 30 g 0   loratadine (CLARITIN) 10 MG tablet Take 1 tablet (10 mg total) by mouth daily. (Patient not taking: Reported on 03/23/2022) 30 tablet 11   metroNIDAZOLE (FLAGYL) 500 MG tablet Take 1 tablet (500 mg total) by mouth 2 (two) times daily. 14 tablet 0   phentermine 37.5 MG capsule Take 1 capsule (37.5 mg total) by mouth every morning. 30 capsule 2   No current facility-administered medications for this visit.  Musculoskeletal: Strength & Muscle Tone: within normal limits Gait & Station: normal Patient leans: N/A  Psychiatric Specialty Exam: Review of Systems  Psychiatric/Behavioral:  Positive for dysphoric mood, hallucinations, sleep disturbance and suicidal ideas. Negative for decreased concentration and self-injury. The patient is nervous/anxious. The patient is not hyperactive.    Last menstrual period 03/18/2022, currently breastfeeding.There is no height or weight on file to calculate BMI.  General Appearance: Casual  Eye Contact:  Good  Speech:  Clear and Coherent and Normal Rate  Volume:  Normal  Mood:  Anxious, Depressed, and Irritable  Affect:  Congruent and Depressed  Thought Process:  Coherent, Goal Directed, and Descriptions of Associations: Intact  Orientation:  Full (Time, Place, and Person)  Thought Content:  WDL, Delusions, and Hallucinations: Visual  Suicidal Thoughts:  Yes.  without intent/plan  Homicidal Thoughts:  No  Memory:  Immediate;   Good Recent;   Good Remote;   Good  Judgement:  Fair  Insight:  Fair  Psychomotor Activity:  Normal  Concentration:  Concentration: Good and Attention Span: Good  Recall:  Good  Fund of Knowledge:Good  Language: Good  Akathisia:   No  Handed:  Ambidextrous  AIMS (if indicated):  not done  Assets:  Armed forces logistics/support/administrative officer Desire for Improvement Housing Social Support Transportation  ADL's:  Intact  Cognition: WNL  Sleep:  Poor   Screenings: AUDIT    Physiological scientist Patient Outreach Telephone from 12/25/2021 in [place] Coordination  Alcohol Use Disorder Identification Test Final Score (AUDIT) 4      GAD-7    Flowsheet Row Office Visit from 03/26/2022 in [facility] Office Visit from 03/18/2022 in [place] from 10/29/2019 in [facility]  Total GAD-7 Score 21 18 15       PHQ2-9    [place] Office Visit from 03/26/2022 in [facility] Patient Outreach Telephone from 12/25/2021 in [place] Coordination Routine Prenatal from 02/16/2021 in [place] Video Visit from 12/03/2020 in [place] from 10/29/2019 in [place] [facility]  PHQ-2 Total Score 6 2 6 6 5   PHQ-9 Total Score 26 5 15 20 16       [place] Office Visit from 03/26/2022 in [facility] ED from 03/21/2022 in [facility]  ED from 03/05/2022 in [place] No Risk No Risk       Assessment and Plan:   Brittnee L. Robidoux is a 26 year old female with a past psychiatric history significant for anxiety and depression who presents to [facility] as a walk-in and to establish psychiatric care.  Patient reports that she has been experiencing highs and lows related to her mood.  Patient states that she recently has gotten over her manic state and states that she has been experiencing depressive symptoms  characterized by crying spells, decreased appetite, irritability, and difficulty getting out of bed.  Patient has been on sertraline in the past for the management of her depressive symptoms but states that the medication never was truly helpful.  Due to patient's rapid cycling between depressive and manic states, it appears that patient symptoms are highly indicative of bipolar disorder (current state depressed).  Patient was recommended Abilify 5 mg daily for  the management of her bipolar disorder.  Patient was also recommended hydroxyzine 10 mg 3 times daily for the management of her anxiety.  Lastly, patient was recommended trazodone 50 mg at bedtime for the management of her sleep disturbances.  Patient was agreeable to recommendations.  Patient's medications to be e-prescribed to pharmacy of choice.  Collaboration of Care: Medication Management AEB provider managing patient's psychiatric medications and Psychiatrist AEB patient being followed by a mental health provider  Patient/Guardian was advised Release of Information must be obtained prior to any record release in order to collaborate their care with an outside provider. Patient/Guardian was advised if they have not already done so to contact the registration department to sign all necessary forms in order for [place] to release information regarding their care.   Consent: Patient/Guardian gives verbal consent for treatment and assignment of benefits for services provided during this visit. Patient/Guardian expressed understanding and agreed to proceed.   1. Generalized anxiety disorder  - hydrOXYzine (ATARAX) 10 MG tablet; Take 1 tablet (10 mg total) by mouth 3 (three) times daily as needed.  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  Dispense: 30 tablet; Refill: 2  3. Bipolar disorder, current episode mixed, moderate (HCC)  -  ARIPiprazole (ABILIFY) 5 MG tablet; Take 1 tablet (5 mg total) by mouth daily.  Dispense: 30 tablet; Refill: 2  Patient to follow up in 6 weeks Provider spent a total of 32 minutes with the patient/reviewing the patient's chart  Malachy Mood, PA 6/5/20234:31 PM

## 2022-03-26 NOTE — Progress Notes (Incomplete Revision)
Psychiatric Initial Adult Assessment   Patient Identification: JEDIDIAH NUTT MRN:  [id number] Date of Evaluation:  03/29/2022 Referral Source: [facility] Chief Complaint:  No chief complaint on file.  Visit Diagnosis:    ICD-10-CM   1. Generalized anxiety disorder  F41.1 hydrOXYzine (ATARAX) 10 MG tablet    2. Insomnia due to other mental disorder  F51.05 traZODone (DESYREL) 50 MG tablet   F99     3. Bipolar disorder, current episode mixed, moderate (HCC)  F31.62 ARIPiprazole (ABILIFY) 5 MG tablet      History of Present Illness:    Cathye L. Gasparyan is a 26 year old female with a past psychiatric history significant for anxiety and depression who presents to [facility] as a walk-in and to establish psychiatric care.  Patient reports that she presented to [facility] last week with a chief complaint of going through mental health issues.  After patient was assessed at [facility], patient was referred over to this facility for psychiatric care.  Today, patient reports that she has been experiencing highs and lows and crying for no reason.  She reports that she is not sleeping well and is having difficulty getting out of bed.  She has also noticed changing in her eating habits.  Patient states that her aforementioned symptoms are always experienced during her low moments.  Patient reports that she also gets agitated and irritable easily.  She states that she has also been engaging in a lot of sex for the past 2 to 3 weeks.  During her "highs" patient feels she can do anything and describes herself as feeling invincible.  During her high moments patient states that she feels like she is capable of building a house or becoming the president.  She also endorses financial extravagance stating that she frequently shops with money that she does not have to.  Patient states that her  Associated  Signs/Symptoms: Depression Symptoms:  depressed mood, anhedonia, insomnia, psychomotor agitation, psychomotor retardation, fatigue, feelings of worthlessness/guilt, difficulty concentrating, hopelessness, impaired memory, recurrent thoughts of death, suicidal thoughts without plan, anxiety, panic attacks, loss of energy/fatigue, disturbed sleep, weight loss, weight gain, decreased labido, decreased appetite, (Hypo) Manic Symptoms:  Delusions, Distractibility, Elevated Mood, Flight of Ideas, Community education officer, Grandiosity, Hallucinations, Impulsivity, Irritable Mood, Labiality of Mood, Sexually Inapproprite Behavior, Anxiety Symptoms:  Agoraphobia, Excessive Worry, Panic Symptoms, Obsessive Compulsive Symptoms:   Cleaning excessively, Social Anxiety, Specific Phobias, Psychotic Symptoms:  Delusions, Hallucinations: Visual Paranoia, PTSD Symptoms: Had a traumatic exposure:  Patient denies traumatic experience Had a traumatic exposure in the last month:  N/A Re-experiencing:  Intrusive Thoughts Nightmares Hypervigilance:  Yes Hyperarousal:  Difficulty Concentrating Emotional Numbness/Detachment Increased Startle Response Irritability/Anger Avoidance:  Decreased Interest/Participation Foreshortened Future  Past Psychiatric History:  Anxiety Depression  Previous Psychotropic Medications: Yes   Substance Abuse History in the last 12 months:  No.  Consequences of Substance Abuse: Medical Consequences:  Patient denies hospitalization due to substance abuse Legal Consequences:  Patient endorses a past charge due to larceny but denies that drugs were involved in the charge Family Consequences:  Patient denies family consequences Blackouts:  Patient denies past history of blacking out DT's: N/A Withdrawal Symptoms:   None  Past Medical History:  Past Medical History:  Diagnosis Date  . Anxiety   . Asthma    pt states she doesn't use inhaler much  (04/10/19)  . Depression   . Gonorrhea   . Heart murmur  when younger  . Vaginal Pap smear, abnormal     Past Surgical History:  Procedure Laterality Date  . CATARACT EXTRACTION    . CHOLECYSTECTOMY N/A 10/30/2021   Procedure: LAPAROSCOPIC CHOLECYSTECTOMY;  Surgeon: Dwan Bolt, MD;  Location: [facility];  Service: General;  Laterality: N/A;  . WISDOM TOOTH EXTRACTION    . WRIST SURGERY      Family Psychiatric History:  Older sister - Borderline personality disorder/Bipolar disorder  Family History:  Family History  Problem Relation Age of Onset  . Hypertension Mother   . Miscarriages / [place] Mother   . Hypertension Maternal Grandmother   . Asthma Maternal Grandmother   . Diabetes Maternal Grandmother   . Vision loss Maternal Grandmother   . Mental illness Brother   . Cancer Maternal Aunt   . Mental illness Sister   . Heart disease Neg Hx     Social History:   Social History   Socioeconomic History  . Marital status: Single    Spouse name: Not on file  . Number of children: 2  . Years of education: 57  . Highest education level: 12th grade  Occupational History  . Occupation: [employer]   Tobacco Use  . Smoking status: Former    Packs/day: 0.25    Types: Cigars, Cigarettes    Quit date: 03/11/2017    Years since quitting: 5.0    Passive exposure: Current  . Smokeless tobacco: Never  Vaping Use  . Vaping Use: Never used  Substance and Sexual Activity  . Alcohol use: Yes    Comment: occ  . Drug use: Not Currently    Types: Marijuana    Comment: occasioinally  . Sexual activity: Not Currently    Partners: Male    Birth control/protection: I.U.D.    Comment: Pregnant   Other Topics Concern  . Not on file  Social History Narrative  . Not on file   Social Determinants of Health   Financial Resource Strain: High Risk  . Difficulty of Paying Living Expenses: Very hard  Food Insecurity: Food Insecurity Present  . Worried About Charity fundraiser in  the Last Year: Often true  . Ran Out of Food in the Last Year: Often true  Transportation Needs: No Transportation Needs  . Lack of Transportation (Medical): No  . Lack of Transportation (Non-Medical): No  Physical Activity: Inactive  . Days of Exercise per Week: 0 days  . Minutes of Exercise per Session: 0 min  Stress: Stress Concern Present  . Feeling of Stress : To some extent  Social Connections: Moderately Isolated  . Frequency of Communication with Friends and Family: More than three times a week  . Frequency of Social Gatherings with Friends and Family: More than three times a week  . Attends Religious Services: 1 to 4 times per year  . Active Member of Clubs or Organizations: No  . Attends Archivist Meetings: Never  . Marital Status: Never married    Additional Social History:  Patient is currently unemployed. Patient endorses housing and transportation. Patient endorses social support through her sister.  Allergies:   Allergies  Allergen Reactions  . Nortrel 1-35 (21) [Norethin-Eth Estrad Triphasic] Anaphylaxis  . Peach [Prunus Persica] Hives and Itching    Metabolic Disorder Labs: Lab Results  Component Value Date   HGBA1C 4.9 09/24/2019   MPG 111 04/14/2015   No results found for: PROLACTIN No results found for: CHOL, TRIG, HDL, CHOLHDL, VLDL, LDLCALC Lab Results  Component Value Date   TSH 1.430 03/18/2022    Therapeutic Level Labs: No results found for: LITHIUM No results found for: CBMZ No results found for: VALPROATE  Current Medications: Current Outpatient Medications  Medication Sig Dispense Refill  . ARIPiprazole (ABILIFY) 5 MG tablet Take 1 tablet (5 mg total) by mouth daily. 30 tablet 2  . hydrOXYzine (ATARAX) 10 MG tablet Take 1 tablet (10 mg total) by mouth 3 (three) times daily as needed. 75 tablet 2  . traZODone (DESYREL) 50 MG tablet Take 1 tablet (50 mg total) by mouth at bedtime. 30 tablet 2  . diphenhydrAMINE (BENADRYL) 25 MG  tablet Take 25 mg by mouth every 6 (six) hours as needed (for allergic reactions).    . EPINEPHrine (EPIPEN 2-PAK) 0.3 mg/0.3 mL IJ SOAJ injection Inject 0.3 mg into the muscle as needed for anaphylaxis. 1 each 1  . famotidine (PEPCID) 20 MG tablet Take 1 tablet (20 mg total) by mouth 2 (two) times daily for 5 days. 10 tablet 0  . hydrocortisone (ANUSOL-HC) 2.5 % rectal cream Place 1 application. rectally 2 (two) times daily. 30 g 0  . loratadine (CLARITIN) 10 MG tablet Take 1 tablet (10 mg total) by mouth daily. (Patient not taking: Reported on 03/23/2022) 30 tablet 11  . metroNIDAZOLE (FLAGYL) 500 MG tablet Take 1 tablet (500 mg total) by mouth 2 (two) times daily. 14 tablet 0  . phentermine 37.5 MG capsule Take 1 capsule (37.5 mg total) by mouth every morning. 30 capsule 2   No current facility-administered medications for this visit.    Musculoskeletal: Strength & Muscle Tone: within normal limits Gait & Station: normal Patient leans: N/A  Psychiatric Specialty Exam: Review of Systems  Psychiatric/Behavioral:  Positive for dysphoric mood, hallucinations, sleep disturbance and suicidal ideas. Negative for decreased concentration and self-injury. The patient is nervous/anxious. The patient is not hyperactive.    Last menstrual period 03/18/2022, currently breastfeeding.There is no height or weight on file to calculate BMI.  General Appearance: Casual  Eye Contact:  Good  Speech:  Clear and Coherent and Normal Rate  Volume:  Normal  Mood:  Anxious, Depressed, and Irritable  Affect:  Congruent and Depressed  Thought Process:  Coherent, Goal Directed, and Descriptions of Associations: Intact  Orientation:  Full (Time, Place, and Person)  Thought Content:  WDL, Delusions, and Hallucinations: Visual  Suicidal Thoughts:  Yes.  without intent/plan  Homicidal Thoughts:  No  Memory:  Immediate;   Good Recent;   Good Remote;   Good  Judgement:  Fair  Insight:  Fair  Psychomotor Activity:   Normal  Concentration:  Concentration: Good and Attention Span: Good  Recall:  Good  Fund of Knowledge:Good  Language: Good  Akathisia:  No  Handed:  Ambidextrous  AIMS (if indicated):  not done  Assets:  Armed forces logistics/support/administrative officer Desire for Improvement Housing Social Support Transportation  ADL's:  Intact  Cognition: WNL  Sleep:  Poor   Screenings: AUDIT    Physiological scientist Patient Outreach Telephone from 12/25/2021 in [place] Coordination  Alcohol Use Disorder Identification Test Final Score (AUDIT) 4      GAD-7    Flowsheet Row Office Visit from 03/26/2022 in [facility] Office Visit from 03/18/2022 in [place] from 10/29/2019 in [place] for [facility]  Total GAD-7 Score 21 18 15       PHQ2-9    [place] Office Visit from  03/26/2022 in [facility] Patient Outreach Telephone from 12/25/2021 in [place] Coordination Routine Prenatal from 02/16/2021 in [place] Video Visit from 12/03/2020 in [place] from 10/29/2019 in [place] for [facility]  PHQ-2 Total Score 6 2 6 6 5   PHQ-9 Total Score 26 5 15 20 16       [place] Office Visit from 03/26/2022 in [facility] ED from 03/21/2022 in [facility] at [facility]  ED from 03/05/2022 in [place] Risk No Risk No Risk       Assessment and Plan:     Collaboration of Care: Medication Management AEB provider managing patient's psychiatric medications and Psychiatrist AEB patient being followed by a mental health provider  Patient/Guardian was advised Release of Information must be obtained prior to any record release in order to collaborate  their care with an outside provider. Patient/Guardian was advised if they have not already done so to contact the registration department to sign all necessary forms in order for [place] to release information regarding their care.   Consent: Patient/Guardian gives verbal consent for treatment and assignment of benefits for services provided during this visit. Patient/Guardian expressed understanding and agreed to proceed.   1. Generalized anxiety disorder  - hydrOXYzine (ATARAX) 10 MG tablet; Take 1 tablet (10 mg total) by mouth 3 (three) times daily as needed.  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  Dispense: 30 tablet; Refill: 2  3. Bipolar disorder, current episode mixed, moderate (HCC)  - ARIPiprazole (ABILIFY) 5 MG tablet; Take 1 tablet (5 mg total) by mouth daily.  Dispense: 30 tablet; Refill: 2  Patient to follow up in 6 weeks Provider spent a total of 32 minutes with the patient/reviewing the patient's chart  Malachy Mood, PA 6/5/20234:31 PM

## 2022-03-29 ENCOUNTER — Encounter (HOSPITAL_COMMUNITY): Payer: Self-pay | Admitting: Physician Assistant

## 2022-04-01 ENCOUNTER — Ambulatory Visit: Payer: Medicaid Other | Admitting: Obstetrics

## 2022-04-08 ENCOUNTER — Ambulatory Visit: Payer: Medicaid Other | Admitting: Allergy & Immunology

## 2022-04-20 ENCOUNTER — Encounter (HOSPITAL_COMMUNITY): Payer: Self-pay

## 2022-04-20 ENCOUNTER — Other Ambulatory Visit: Payer: Self-pay

## 2022-04-20 ENCOUNTER — Emergency Department (HOSPITAL_COMMUNITY)
Admission: EM | Admit: 2022-04-20 | Discharge: 2022-04-20 | Discharge status: Against Medical Advice | Payer: Medicaid Other | Attending: Emergency Medicine | Admitting: Emergency Medicine

## 2022-04-20 DIAGNOSIS — K0889 Other specified disorders of teeth and supporting structures: Secondary | ICD-10-CM | POA: Insufficient documentation

## 2022-04-20 DIAGNOSIS — R21 Rash and other nonspecific skin eruption: Secondary | ICD-10-CM | POA: Diagnosis not present

## 2022-04-20 DIAGNOSIS — Z5321 Procedure and treatment not carried out due to patient leaving prior to being seen by health care provider: Secondary | ICD-10-CM | POA: Insufficient documentation

## 2022-04-20 LAB — COMPREHENSIVE METABOLIC PANEL
ALT: 13 U/L (ref 0–44)
AST: 19 U/L (ref 15–41)
Albumin: 3.9 g/dL (ref 3.5–5.0)
Alkaline Phosphatase: 41 U/L (ref 38–126)
Anion gap: 11 (ref 5–15)
BUN: 6 mg/dL (ref 6–20)
CO2: 24 mmol/L (ref 22–32)
Calcium: 9.3 mg/dL (ref 8.9–10.3)
Chloride: 103 mmol/L (ref 98–111)
Creatinine, Ser: 0.78 mg/dL (ref 0.44–1.00)
GFR, Estimated: >60 mL/min (ref >60)
Glucose, Bld: 82 mg/dL (ref 70–99)
Potassium: 4 mmol/L (ref 3.5–5.1)
Sodium: 138 mmol/L (ref 135–145)
Total Bilirubin: 0.4 mg/dL (ref 0.3–1.2)
Total Protein: 7 g/dL (ref 6.5–8.1)

## 2022-04-20 LAB — CBC WITH DIFFERENTIAL/PLATELET
Abs Immature Granulocytes: 0.01 10*3/uL (ref 0.00–0.07)
Basophils Absolute: 0 10*3/uL (ref 0.0–0.1)
Basophils Relative: 1 %
Eosinophils Absolute: 0.1 10*3/uL (ref 0.0–0.5)
Eosinophils Relative: 2 %
HCT: 40.3 % (ref 36.0–46.0)
Hemoglobin: 13.1 g/dL (ref 12.0–15.0)
Immature Granulocytes: 0 %
Lymphocytes Relative: 34 %
Lymphs Abs: 1.4 10*3/uL (ref 0.7–4.0)
MCH: 28.4 pg (ref 26.0–34.0)
MCHC: 32.5 g/dL (ref 30.0–36.0)
MCV: 87.2 fL (ref 80.0–100.0)
Monocytes Absolute: 0.4 10*3/uL (ref 0.1–1.0)
Monocytes Relative: 9 %
Neutro Abs: 2.2 10*3/uL (ref 1.7–7.7)
Neutrophils Relative %: 54 %
Platelets: 201 10*3/uL (ref 150–400)
RBC: 4.62 MIL/uL (ref 3.87–5.11)
RDW: 13 % (ref 11.5–15.5)
WBC: 4 10*3/uL (ref 4.0–10.5)
nRBC: 0 % (ref 0.0–0.2)

## 2022-04-20 LAB — HIV ANTIBODY (ROUTINE TESTING W REFLEX): HIV Screen 4th Generation wRfx: NONREACTIVE

## 2022-04-20 LAB — I-STAT BETA HCG BLOOD, ED (MC, WL, AP ONLY): I-stat hCG, quantitative: <5 m[IU]/mL (ref <5)

## 2022-04-21 ENCOUNTER — Telehealth: Payer: Self-pay | Admitting: *Deleted

## 2022-04-21 ENCOUNTER — Encounter (HOSPITAL_COMMUNITY): Payer: Self-pay

## 2022-04-21 ENCOUNTER — Ambulatory Visit (HOSPITAL_COMMUNITY)
Admission: EM | Admit: 2022-04-21 | Discharge: 2022-04-21 | Disposition: A | Discharge status: Home / Self Care | Payer: Medicaid Other | Attending: Physician Assistant | Admitting: Physician Assistant

## 2022-04-21 DIAGNOSIS — K1379 Other lesions of oral mucosa: Secondary | ICD-10-CM

## 2022-04-21 DIAGNOSIS — K047 Periapical abscess without sinus: Secondary | ICD-10-CM

## 2022-04-21 LAB — RPR: RPR Ser Ql: NONREACTIVE

## 2022-04-21 MED ORDER — DICLOFENAC SODIUM 75 MG PO TBEC: 75.0000 mg | DELAYED_RELEASE_TABLET | Freq: Two times a day (BID) | ORAL | 0 refills | Status: DC

## 2022-04-21 MED ORDER — AMOXICILLIN 500 MG PO CAPS: 500.0000 mg | ORAL_CAPSULE | Freq: Three times a day (TID) | ORAL | 0 refills | Status: DC

## 2022-04-21 NOTE — Patient Outreach (Signed)
Care Coordination  04/21/2022  Andrea Jenkins 1996-06-11 [id number]  Transition Care Management Unsuccessful Follow-up Telephone Call  Date of discharge and from where:  04/20/22-[facility] and 04/21/22-[facility]  Attempts:  1st Attempt  Reason for unsuccessful TCM follow-up call:  Left voice message  Estanislado Emms RN, BSN [place]  [facility] RN Care Coordinator

## 2022-04-21 NOTE — ED Provider Notes (Signed)
[facility]    CSN: [id number] Arrival date & time: 04/21/22  0813      History   Chief Complaint Chief Complaint  Patient presents with   Dental Pain   Rash    HPI Andrea Jenkins is a 26 y.o. female.   Pt reports she has swelling and pain in her mouth after having dental extractions   The history is provided by the patient. No language interpreter was used.  Dental Pain Location:  Upper Quality:  Aching Severity:  Moderate Onset quality:  Gradual Timing:  Constant Progression:  Worsening Chronicity:  New Rash   Past Medical History:  Diagnosis Date   Anxiety    Asthma    pt states she doesn't use inhaler much (04/10/19)   Depression    Gonorrhea    Heart murmur    when younger   Vaginal Pap smear, abnormal     Patient Active Problem List   Diagnosis Date Noted   Generalized anxiety disorder 03/26/2022   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   Bipolar disorder, current episode mixed, moderate (HCC) 03/26/2022   Fatigue 03/19/2022   Overweight (BMI 25.0-29.9) 03/19/2022   Depression    Menorrhagia    Allergic reaction to drug, initial encounter 03/06/2022   Acute cholecystitis 10/30/2021   Anxiety    Alpha thalassemia silent carrier 10/24/2019   LGSIL on Pap smear of cervix 10/20/2016    Past Surgical History:  Procedure Laterality Date   CATARACT EXTRACTION     CHOLECYSTECTOMY N/A 10/30/2021   Procedure: LAPAROSCOPIC CHOLECYSTECTOMY;  Surgeon: Fritzi Mandes, MD;  Location: [facility];  Service: General;  Laterality: N/A;   WISDOM TOOTH EXTRACTION     WRIST SURGERY      OB History     Gravida  3   Para  3   Term  3   Preterm  0   AB  0   Living  3      SAB  0   IAB  0   Ectopic  0   Multiple  0   Live Births  3            Home Medications    Prior to Admission medications   Medication Sig Start Date End Date Taking? Authorizing Provider  ARIPiprazole (ABILIFY) 5 MG tablet Take 1 tablet (5 mg  total) by mouth daily. 03/26/22 03/26/23  Nwoko, Tommas Olp, PA  diphenhydrAMINE (BENADRYL) 25 MG tablet Take 25 mg by mouth every 6 (six) hours as needed (for allergic reactions).    [provider]  EPINEPHrine (EPIPEN 2-PAK) 0.3 mg/0.3 mL IJ SOAJ injection Inject 0.3 mg into the muscle as needed for anaphylaxis. 03/06/22   Amin, Loura Halt, MD  famotidine (PEPCID) 20 MG tablet Take 1 tablet (20 mg total) by mouth 2 (two) times daily for 5 days. 03/06/22 03/11/22  Amin, Loura Halt, MD  hydrocortisone (ANUSOL-HC) 2.5 % rectal cream Place 1 application. rectally 2 (two) times daily. 03/24/22   Lamptey, Britta Mccreedy, MD  hydrOXYzine (ATARAX) 10 MG tablet Take 1 tablet (10 mg total) by mouth 3 (three) times daily as needed. 03/26/22   Nwoko, Tommas Olp, PA  loratadine (CLARITIN) 10 MG tablet Take 1 tablet (10 mg total) by mouth daily. Patient not taking: Reported on 03/23/2022 02/16/22   Brock Bad, MD  metroNIDAZOLE (FLAGYL) 500 MG tablet Take 1 tablet (500 mg total) by mouth 2 (two) times daily. 03/24/22   Lamptey,  Britta Mccreedy, MD  phentermine 37.5 MG capsule Take 1 capsule (37.5 mg total) by mouth every morning. 02/16/22   Brock Bad, MD  traZODone (DESYREL) 50 MG tablet Take 1 tablet (50 mg total) by mouth at bedtime. 03/26/22   Meta Hatchet, PA    Family History Family History  Problem Relation Age of Onset   Hypertension Mother    Miscarriages / [place] Mother    Hypertension Maternal Grandmother    Asthma Maternal Grandmother    Diabetes Maternal Grandmother    Vision loss Maternal Grandmother    Mental illness Brother    Cancer Maternal Aunt    Mental illness Sister    Heart disease Neg Hx     Social History Social History   Tobacco Use   Smoking status: Former    Packs/day: 0.25    Types: Cigars, Cigarettes    Quit date: 03/11/2017    Years since quitting: 5.1    Passive exposure: Current   Smokeless tobacco: Never  Vaping Use   Vaping Use: Every day   Substance Use Topics   Alcohol use: Yes    Comment: occ   Drug use: Not Currently    Types: Marijuana    Comment: occasioinally     Allergies   Nortrel 1-35 (21) [norethin-eth estrad triphasic] and Peach [prunus persica]   Review of Systems Review of Systems  Skin:  Positive for rash.  All other systems reviewed and are negative.    Physical Exam Triage Vital Signs ED Triage Vitals  Enc Vitals Group     BP 04/21/22 0851 110/64     Pulse Rate 04/21/22 0851 82     Resp 04/21/22 0851 18     Temp 04/21/22 0851 98.2 F (36.8 C)     Temp Source 04/21/22 0851 Oral     SpO2 04/21/22 0851 100 %     Weight --      Height --      Head Circumference --      Peak Flow --      Pain Score 04/21/22 0852 6     Pain Loc --      Pain Edu? --      Excl. in GC? --    No data found.  Updated Vital Signs BP 110/64 (BP Location: Left Arm)   Pulse 82   Temp 98.2 F (36.8 C) (Oral)   Resp 18   LMP 03/25/2022   SpO2 100%   Breastfeeding No   Visual Acuity Right Eye Distance:   Left Eye Distance:   Bilateral Distance:    Right Eye Near:   Left Eye Near:    Bilateral Near:     Physical Exam Vitals reviewed.  Constitutional:      Appearance: Normal appearance.  HENT:     Right Ear: Tympanic membrane normal.     Left Ear: Tympanic membrane normal.     Nose: Nose normal.     Mouth/Throat:     Mouth: Mucous membranes are moist.     Comments: Erythema extraction sites  Cardiovascular:     Rate and Rhythm: Normal rate.  Pulmonary:     Effort: Pulmonary effort is normal.  Abdominal:     General: Abdomen is flat.  Musculoskeletal:        General: Normal range of motion.  Skin:    General: Skin is warm.  Neurological:     General: No focal deficit present.     Mental Status:  She is alert.  Psychiatric:        Mood and Affect: Mood normal.     I suspect normal healing however due to continued discomfort and swelling I will treat.  Pt request medication for  discomfort.  Pt also request vaginal swab.  No std concerns but she has a new partner  UC Treatments / Results  Labs (all labs ordered are listed, but only abnormal results are displayed) Labs Reviewed  CERVICOVAGINAL ANCILLARY ONLY    EKG   Radiology No results found.  Procedures Procedures (including critical care time)  Medications Ordered in UC Medications - No data to display  Initial Impression / Assessment and Plan / UC Course  I have reviewed the triage vital signs and the nursing notes.  Pertinent labs & imaging results that were available during my care of the patient were reviewed by me and considered in my medical decision making (see chart for details).      Final Clinical Impressions(s) / UC Diagnoses   Final diagnoses:  Dental infection  Oral pain   Discharge Instructions   None    ED Prescriptions     Medication Sig Dispense Auth. Provider   amoxicillin (AMOXIL) 500 MG capsule Take 1 capsule (500 mg total) by mouth 3 (three) times daily. 21 capsule Addilyne Backs K, [place]   diclofenac (VOLTAREN) 75 MG EC tablet Take 1 tablet (75 mg total) by mouth 2 (two) times daily. 14 tablet Elson Areas, [place]     An After Visit Summary was printed and given to the patient.  PDMP not reviewed this encounter.   Elson Areas, [place] 04/21/22 1242

## 2022-04-21 NOTE — ED Triage Notes (Signed)
Pt states had 4 teeth removed last week and still having pain. States also has a rash in her mouth. Pt states also having a headache and post nasal drip for the past few day. Denies taking any meds.

## 2022-04-22 ENCOUNTER — Telehealth (HOSPITAL_COMMUNITY): Payer: Self-pay | Admitting: Emergency Medicine

## 2022-04-22 ENCOUNTER — Telehealth: Payer: Self-pay | Admitting: *Deleted

## 2022-04-22 ENCOUNTER — Ambulatory Visit: Payer: Self-pay

## 2022-04-22 LAB — CERVICOVAGINAL ANCILLARY ONLY
Bacterial Vaginitis (gardnerella): POSITIVE — AB
Candida Glabrata: NEGATIVE
Candida Vaginitis: NEGATIVE
Chlamydia: NEGATIVE
Comment: NEGATIVE
Comment: NEGATIVE
Comment: NEGATIVE
Comment: NEGATIVE
Comment: NEGATIVE
Comment: NORMAL
Neisseria Gonorrhea: NEGATIVE
Trichomonas: NEGATIVE

## 2022-04-22 MED ORDER — METRONIDAZOLE 500 MG PO TABS: 500.0000 mg | ORAL_TABLET | Freq: Two times a day (BID) | ORAL | 0 refills | Status: DC

## 2022-04-22 NOTE — Patient Outreach (Signed)
Care Coordination  04/22/2022  BEVERLEY ALLENDER [birth year] [id number]  Transition Care Management Unsuccessful Follow-up Telephone Call  Date of discharge and from where:  04/21/22, [facility]  Attempts:  2nd Attempt  Reason for unsuccessful TCM follow-up call:  Left voice message   Estanislado Emms RN, BSN [place]  [facility] RN Care Coordinator

## 2022-04-30 ENCOUNTER — Other Ambulatory Visit: Payer: Self-pay | Admitting: *Deleted

## 2022-04-30 NOTE — Patient Outreach (Signed)
Medicaid Managed Care   Nurse Care Manager Note  04/30/2022 Name:  Andrea Jenkins MRN:  [id number] DOB:  November 13, 1995  Andrea Jenkins is an 26 y.o. year old female who is a primary patient of Patient, No Pcp Per.  The [facility] Managed Care Coordination team was consulted for assistance with:    Health Maintenance  Andrea Jenkins was given information about Medicaid Managed Care Coordination team services today. [place] Patient agreed to services and verbal consent obtained.  Engaged with patient by telephone for follow up visit in response to provider referral for case management and/or care coordination services.   Assessments/Interventions:  Review of past medical history, allergies, medications, health status, including review of consultants reports, laboratory and other test data, was performed as part of comprehensive evaluation and provision of chronic care management services.  SDOH (Social Determinants of Health) assessments and interventions performed: SDOH Interventions    Flowsheet Row Most Recent Value  SDOH Interventions   Transportation Interventions Intervention Not Indicated       Care Plan  Allergies  Allergen Reactions   Nortrel 1-35 (21) [Norethin-Eth Estrad Triphasic] Anaphylaxis   Peach [Prunus Persica] Hives and Itching    Medications Reviewed Today     Reviewed by [name], RN (Registered Nurse) on 04/30/22 at 1116  Med List Status: <None>   Medication Order Taking? Sig Documenting Provider Last Dose Status Informant  amoxicillin (AMOXIL) 500 MG capsule [id number] No Take 1 capsule (500 mg total) by mouth 3 (three) times daily.  Patient not taking: Reported on 04/30/2022   Fransico Meadow, PA-C Not Taking Active            Med Note (Messiyah Waterson A   Fri Apr 30, 2022 11:13 AM) completed  ARIPiprazole (ABILIFY) 5 MG tablet [id number] Yes Take 1 tablet (5 mg total) by mouth daily. Malachy Mood, PA Taking Active   diclofenac (VOLTAREN) 75  MG EC tablet [id number] No Take 1 tablet (75 mg total) by mouth 2 (two) times daily.  Patient not taking: Reported on 04/30/2022   Fransico Meadow, PA-C Not Taking Active   diphenhydrAMINE (BENADRYL) 25 MG tablet [id number] Yes Take 25 mg by mouth every 6 (six) hours as needed (for allergic reactions). [provider] Taking Active Multiple Informants  EPINEPHrine (EPIPEN 2-PAK) 0.3 mg/0.3 mL IJ SOAJ injection [id number] Yes Inject 0.3 mg into the muscle as needed for anaphylaxis. [name], MD Taking Active   famotidine (PEPCID) 20 MG tablet [id number]  Take 1 tablet (20 mg total) by mouth 2 (two) times daily for 5 days. [name], MD  Expired 03/11/22 2359   hydrocortisone (ANUSOL-HC) 2.5 % rectal cream [id number] No Place 1 application. rectally 2 (two) times daily.  Patient not taking: Reported on 04/30/2022   Chase Picket, MD Not Taking Active   hydrOXYzine (ATARAX) 10 MG tablet [id number] Yes Take 1 tablet (10 mg total) by mouth 3 (three) times daily as needed. Malachy Mood, PA Taking Active   loratadine (CLARITIN) 10 MG tablet [id number] No Take 1 tablet (10 mg total) by mouth daily.  Patient not taking: Reported on 03/23/2022   Shelly Bombard, MD Not Taking Active Multiple Informants  metroNIDAZOLE (FLAGYL) 500 MG tablet [id number] No Take 1 tablet (500 mg total) by mouth 2 (two) times daily.  Patient not taking: Reported on 04/30/2022   Chase Picket, MD Not Taking Active  Med Note (Zelta Enfield A   Fri Apr 30, 2022 11:16 AM) completed  phentermine 37.5 MG capsule [id number] No Take 1 capsule (37.5 mg total) by mouth every morning.  Patient not taking: Reported on 04/30/2022   Shelly Bombard, MD Not Taking Active Multiple Informants  traZODone (DESYREL) 50 MG tablet [id number] Yes Take 1 tablet (50 mg total) by mouth at bedtime. 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   Bipolar disorder, current episode mixed, moderate ([place]) 03/26/2022   Fatigue 03/19/2022   Overweight (BMI 25.0-29.9) 03/19/2022   Depression    Menorrhagia    Allergic reaction to drug, initial encounter 03/06/2022   Acute cholecystitis 10/30/2021   Anxiety    Alpha thalassemia silent carrier 10/24/2019   LGSIL on Pap smear of cervix 10/20/2016    Conditions to be addressed/monitored per PCP order:   Health Maintenance  Care Plan : RN Care Manager Plan of Care  Updates made by [name], RN since 04/30/2022 12:00 AM     Problem: Health Management Needs related to Health Maintenance, Anxiety and Depression      Long-Range Goal: Development of Plan of Care to address Health Management Needs related to Health Maintenance, Anxiety and Depression   Start Date: 12/18/2021  Expected End Date: 07/23/2022  Priority: High  Note:   Current Barriers:  Knowledge Deficits related to plan of care for management of Anxiety with Panic Symptoms, and Depression: depressed mood decreased appetite and Health Maintenance -Andrea Jenkins has established care with PCP at [facility]. A referral to Asthma and Allergy was placed and an appointment scheduled for 05/26/22. Andrea Jenkins has started receiving dental care and orthodontic care. She reports new diagnosis of bipolar and is receiving Mental Health care with Behavioral health.    RNCM Clinical Goal(s):  Patient will verbalize understanding of plan for management of Anxiety, Depression, and Health Maintenance as evidenced by verbalization of self monitoring activities take all medications exactly as prescribed and will call provider for medication related questions as evidenced by documentation in EMR    work with social worker to address [place] Concerns  related to the  management of Anxiety and Depression as evidenced by review of EMR and patient or social worker report     through collaboration with Consulting civil engineer, provider, and care team. -Met Work with Care Guide for food insecurity and needing a dental provider-Met  Interventions: Inter-disciplinary care team collaboration (see longitudinal plan of care) Evaluation of current treatment plan related to  self management and patient's adherence to plan as established by provider   Health Maintenance  (Status: Goal on track: NO.) Long Term Goal  Evaluation of current treatment plan related to Anxiety, Depression, and Health Maintenance , Limited access to food self-management and patient's adherence to plan as established by provider. Discussed plans with patient for ongoing care management follow up and provided patient with direct contact information for care management team Provided education to patient re: family planning; Reviewed medications with patient and discussed the importance of taking all medications as directed; Provided patient with MyChart educational materials related to Health Maintenance; Discussed the  importance of eating 3 meals a day, exercising regularly and increasing her nighttime sleep Advised patient to follow up with Dr. Jodi Mourning regarding allergy to [facility], provided education to patient for safe sex  Patient Goals/Self-Care Activities: Take medications as prescribed   Attend church or other social activities Work with the Education officer, museum to address care coordination needs and will continue to work with the clinical team to address health care and disease management related needs call 1-800-273-TALK (toll free, 24 hour hotline) go to [facility] [address] [place] [phone]) call 911 if experiencing a Mental Health or [place] new patient appointment with provider of choice       Follow Up:  Patient agrees  to Care Plan and Follow-up.  Plan: The Managed Medicaid care management team will reach out to the patient again over the next 60 days.  Date/time of next scheduled RN care management/care coordination outreach:  06/29/22 @ 10:30am  Lurena Joiner RN, BSN [place] RN Care Coordinator

## 2022-04-30 NOTE — Patient Instructions (Signed)
Visit Information  Andrea Jenkins was given information about Medicaid Managed Care team care coordination services as a part of their Healthy [facility] Medicaid benefit. Andrea Jenkins verbally consented to engagement with the [facility] Managed Care team.   If you are experiencing a medical emergency, please call 911 or report to your local emergency department or urgent care.   If you have a non-emergency medical problem during routine business hours, please contact your provider's office and ask to speak with a nurse.   For questions related to your Healthy [facility] health plan, please call: [phone] or visit the homepage here: [email or web address]  If you would like to schedule transportation through your Healthy [facility] plan, please call the following number at least 2 days in advance of your appointment: [phone]  For information about your ride after you set it up, call Ride Assist at [phone]. Use this number to activate a Will Call pickup, or if your transportation is late for a scheduled pickup. Use this number, too, if you need to make a change or cancel a previously scheduled reservation.  If you need transportation services right away, call [phone]. The after-hours call center is staffed 24 hours to handle ride assistance and urgent reservation requests (including discharges) 365 days a year. Urgent trips include sick visits, hospital discharge requests and life-sustaining treatment.  Call the [place] at [phone], at any time, 24 hours a day, 7 days a week. If you are in danger or need immediate medical attention call 911.  If you would like help to quit smoking, call 1-800-QUIT-NOW 301-346-4165) OR Espaol: 1-855-Djelo-Ya (8-937-342-8768) o para ms informacin haga clic aqu or Text READY to 200-400 to register via text  Andrea Jenkins,   Please see education materials related to safe sex and  contraception provided by MyChart link.  Patient verbalizes understanding of instructions and care plan provided today and agrees to view in [place]. Active MyChart status and patient understanding of how to access instructions and care plan via MyChart confirmed with patient.     Telephone follow up appointment with Managed Medicaid care management team member scheduled for:06/29/22 @ 10:30am  Lurena Joiner RN, BSN [place] RN Care Coordinator   Following is a copy of your plan of care:  Care Plan : RN Care Manager Plan of Care  Updates made by [name], RN since 04/30/2022 12:00 AM     Problem: Health Management Needs related to Health Maintenance, Anxiety and Depression      Long-Range Goal: Development of Plan of Care to address Health Management Needs related to Health Maintenance, Anxiety and Depression   Start Date: 12/18/2021  Expected End Date: 07/23/2022  Priority: High  Note:   Current Barriers:  Knowledge Deficits related to plan of care for management of Anxiety with Panic Symptoms, and Depression: depressed mood decreased appetite and Health Maintenance -Andrea Jenkins has established care with PCP at [facility]. A referral to Asthma and Allergy was placed and an appointment scheduled for 05/26/22. Andrea Jenkins has started receiving dental care and orthodontic care. She reports new diagnosis of bipolar and is receiving Mental Health care with Behavioral health.    RNCM Clinical Goal(s):  Patient will verbalize understanding of plan for management of Anxiety, Depression, and Health Maintenance as evidenced by verbalization of self monitoring activities take all medications exactly as prescribed and will call provider for medication related questions as evidenced by documentation in EMR  work with Education officer, museum to address [place] Concerns  related to the management of Anxiety and Depression as evidenced by review of EMR and patient  or Education officer, museum report     through collaboration with Consulting civil engineer, provider, and care team. -Met Work with Care Guide for food insecurity and needing a dental provider-Met  Interventions: Inter-disciplinary care team collaboration (see longitudinal plan of care) Evaluation of current treatment plan related to  self management and patient's adherence to plan as established by provider   Health Maintenance  (Status: Goal on track: NO.) Long Term Goal  Evaluation of current treatment plan related to Anxiety, Depression, and Health Maintenance , Limited access to food self-management and patient's adherence to plan as established by provider. Discussed plans with patient for ongoing care management follow up and provided patient with direct contact information for care management team Provided education to patient re: family planning; Reviewed medications with patient and discussed the importance of taking all medications as directed; Provided patient with MyChart educational materials related to Health Maintenance; Discussed the importance of eating 3 meals a day, exercising regularly and increasing her nighttime sleep Advised patient to follow up with Dr. Jodi Mourning regarding allergy to [facility], provided education to patient for safe sex  Patient Goals/Self-Care Activities: Take medications as prescribed   Attend church or other social activities Work with the Education officer, museum to address care coordination needs and will continue to work with the clinical team to address health care and disease management related needs call 1-800-273-TALK (toll free, 24 hour hotline) go to [facility] [address], [place] [phone]) call 911 if experiencing a Mental Health or [place] new patient appointment with provider of choice

## 2022-05-07 ENCOUNTER — Ambulatory Visit (HOSPITAL_COMMUNITY): Payer: Medicaid Other | Admitting: Student in an Organized Health Care Education/Training Program

## 2022-05-09 ENCOUNTER — Ambulatory Visit
Admission: EM | Admit: 2022-05-09 | Discharge: 2022-05-09 | Disposition: A | Discharge status: Home / Self Care | Payer: Medicaid Other | Attending: Physician Assistant | Admitting: Physician Assistant

## 2022-05-09 ENCOUNTER — Encounter: Payer: Self-pay | Admitting: Emergency Medicine

## 2022-05-09 DIAGNOSIS — Z113 Encounter for screening for infections with a predominantly sexual mode of transmission: Secondary | ICD-10-CM | POA: Diagnosis not present

## 2022-05-09 DIAGNOSIS — N898 Other specified noninflammatory disorders of vagina: Secondary | ICD-10-CM | POA: Diagnosis not present

## 2022-05-09 DIAGNOSIS — N760 Acute vaginitis: Secondary | ICD-10-CM | POA: Diagnosis not present

## 2022-05-09 LAB — POCT URINE PREGNANCY: Preg Test, Ur: NEGATIVE

## 2022-05-09 MED ORDER — FLUCONAZOLE 150 MG PO TABS: 150.0000 mg | ORAL_TABLET | ORAL | 0 refills | Status: DC | PRN

## 2022-05-09 NOTE — ED Provider Notes (Signed)
[name]    CSN: [id number] Arrival date & time: 05/09/22  1333      History   Chief Complaint Chief Complaint  Patient presents with   Vaginal Discharge    HPI Andrea Jenkins is a 26 y.o. female.   Patient presents today with a 1 day history of vaginal irritation and discharge.  She reports the discharge is thick and white.  She denies any odor, pruritus, pain, pelvic pain, abdominal pain, fever, nausea, vomiting.  She does not believe that she is pregnant but is open to testing.  She does report that she is sexually active with female partners and does not consistently use condoms.  Denies any specific exposure to STI.  Does report she was recently treated for a dental infection with amoxicillin but denies additional antibiotic use.  She denies any changes to personal hygiene products including soaps or detergents.  She is requesting HIV and syphilis testing today.    Past Medical History:  Diagnosis Date   Anxiety    Asthma    pt states she doesn't use inhaler much (04/10/19)   Depression    Gonorrhea    Heart murmur    when younger   Vaginal Pap smear, abnormal     Patient Active Problem List   Diagnosis Date Noted   Generalized anxiety disorder 03/26/2022   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   Bipolar disorder, current episode mixed, moderate ([facility]) 03/26/2022   Fatigue 03/19/2022   Overweight (BMI 25.0-29.9) 03/19/2022   Depression    Menorrhagia    Allergic reaction to drug, initial encounter 03/06/2022   Acute cholecystitis 10/30/2021   Anxiety    Alpha thalassemia silent carrier 10/24/2019   LGSIL on Pap smear of cervix 10/20/2016    Past Surgical History:  Procedure Laterality Date   CATARACT EXTRACTION     CHOLECYSTECTOMY N/A 10/30/2021   Procedure: LAPAROSCOPIC CHOLECYSTECTOMY;  Surgeon: Fritzi Mandes, MD;  Location: [facility];  Service: General;  Laterality: N/A;   WISDOM TOOTH EXTRACTION     WRIST SURGERY      OB  History     Gravida  3   Para  3   Term  3   Preterm  0   AB  0   Living  3      SAB  0   IAB  0   Ectopic  0   Multiple  0   Live Births  3            Home Medications    Prior to Admission medications   Medication Sig Start Date End Date Taking? Authorizing Provider  fluconazole (DIFLUCAN) 150 MG tablet Take 1 tablet (150 mg total) by mouth every 3 (three) days as needed. 05/09/22  Yes Raffaela Ladley K, PA-C  ARIPiprazole (ABILIFY) 5 MG tablet Take 1 tablet (5 mg total) by mouth daily. 03/26/22 03/26/23  Nwoko, Tommas Olp, PA  diclofenac (VOLTAREN) 75 MG EC tablet Take 1 tablet (75 mg total) by mouth 2 (two) times daily. Patient not taking: Reported on 04/30/2022 04/21/22   Elson Areas, PA-C  diphenhydrAMINE (BENADRYL) 25 MG tablet Take 25 mg by mouth every 6 (six) hours as needed (for allergic reactions).    [provider]  EPINEPHrine (EPIPEN 2-PAK) 0.3 mg/0.3 mL IJ SOAJ injection Inject 0.3 mg into the muscle as needed for anaphylaxis. 03/06/22   Amin, Loura Halt, MD  famotidine (PEPCID) 20 MG tablet Take 1 tablet (  20 mg total) by mouth 2 (two) times daily for 5 days. 03/06/22 03/11/22  Amin, Loura Halt, MD  hydrocortisone (ANUSOL-HC) 2.5 % rectal cream Place 1 application. rectally 2 (two) times daily. Patient not taking: Reported on 04/30/2022 03/24/22   Merrilee Jansky, MD  hydrOXYzine (ATARAX) 10 MG tablet Take 1 tablet (10 mg total) by mouth 3 (three) times daily as needed. 03/26/22   Nwoko, Tommas Olp, PA  loratadine (CLARITIN) 10 MG tablet Take 1 tablet (10 mg total) by mouth daily. Patient not taking: Reported on 03/23/2022 02/16/22   Brock Bad, MD  metroNIDAZOLE (FLAGYL) 500 MG tablet Take 1 tablet (500 mg total) by mouth 2 (two) times daily. Patient not taking: Reported on 04/30/2022 04/22/22   Merrilee Jansky, MD  phentermine 37.5 MG capsule Take 1 capsule (37.5 mg total) by mouth every morning. Patient not taking: Reported on 04/30/2022 02/16/22    Brock Bad, MD  traZODone (DESYREL) 50 MG tablet Take 1 tablet (50 mg total) by mouth at bedtime. 03/26/22   Meta Hatchet, PA    Family History Family History  Problem Relation Age of Onset   Hypertension Mother    Miscarriages / [place] Mother    Hypertension Maternal Grandmother    Asthma Maternal Grandmother    Diabetes Maternal Grandmother    Vision loss Maternal Grandmother    Mental illness Brother    Cancer Maternal Aunt    Mental illness Sister    Heart disease Neg Hx     Social History Social History   Tobacco Use   Smoking status: Former    Packs/day: 0.25    Types: Cigars, Cigarettes    Quit date: 03/11/2017    Years since quitting: 5.1    Passive exposure: Current   Smokeless tobacco: Never  Vaping Use   Vaping Use: Every day  Substance Use Topics   Alcohol use: Yes    Comment: occ   Drug use: Not Currently    Types: Marijuana    Comment: occasioinally     Allergies   Nortrel 1-35 (21) [norethin-eth estrad triphasic] and Peach [prunus persica]   Review of Systems Review of Systems  Constitutional:  Negative for activity change, appetite change, fatigue and fever.  Respiratory:  Negative for cough and shortness of breath.   Cardiovascular:  Negative for chest pain.  Gastrointestinal:  Negative for abdominal pain, diarrhea, nausea and vomiting.  Genitourinary:  Positive for vaginal discharge. Negative for dysuria, frequency, urgency, vaginal bleeding and vaginal pain.     Physical Exam Triage Vital Signs ED Triage Vitals [05/09/22 1407]  Enc Vitals Group     BP 120/78     Pulse Rate 77     Resp 18     Temp 98.2 F (36.8 C)     Temp src      SpO2 98 %     Weight      Height      Head Circumference      Peak Flow      Pain Score 0     Pain Loc      Pain Edu?      Excl. in GC?    No data found.  Updated Vital Signs BP 120/78   Pulse 77   Temp 98.2 F (36.8 C)   Resp 18   LMP 05/03/2022 (Approximate)   SpO2 98%    Breastfeeding No   Visual Acuity Right Eye Distance:   Left Eye Distance:  Bilateral Distance:    Right Eye Near:   Left Eye Near:    Bilateral Near:     Physical Exam Vitals reviewed.  Constitutional:      General: She is awake. She is not in acute distress.    Appearance: Normal appearance. She is well-developed. She is not ill-appearing.     Comments: Very pleasant female appears stated age in no acute distress sitting comfortably in exam room  HENT:     Head: Normocephalic and atraumatic.  Cardiovascular:     Rate and Rhythm: Normal rate and regular rhythm.     Heart sounds: Normal heart sounds, S1 normal and S2 normal. No murmur heard. Pulmonary:     Effort: Pulmonary effort is normal.     Breath sounds: Normal breath sounds. No wheezing, rhonchi or rales.     Comments: Clear to auscultation bilaterally Abdominal:     General: Bowel sounds are normal.     Palpations: Abdomen is soft.     Tenderness: There is no abdominal tenderness. There is no right CVA tenderness, left CVA tenderness, guarding or rebound.     Comments: Benign abdominal exam  Genitourinary:    Comments: Exam deferred Psychiatric:        Behavior: Behavior is cooperative.      UC Treatments / Results  Labs (all labs ordered are listed, but only abnormal results are displayed) Labs Reviewed  HIV ANTIBODY (ROUTINE TESTING W REFLEX)  RPR  POCT URINE PREGNANCY  CERVICOVAGINAL ANCILLARY ONLY    EKG   Radiology No results found.  Procedures Procedures (including critical care time)  Medications Ordered in UC Medications - No data to display  Initial Impression / Assessment and Plan / UC Course  I have reviewed the triage vital signs and the nursing notes.  Pertinent labs & imaging results that were available during my care of the patient were reviewed by me and considered in my medical decision making (see chart for details).     Concern for antibiotic induced yeast infection given  clinical presentation.  Diflucan was started with instruction to take 1 dose today and another dose in 3 days if symptoms persist.  STI swab was collected and if there is anything else that requires treatment we will contact her to arrange this.  She was encouraged to monitor her MyChart for these results.  HIV and syphilis testing were obtained today-results pending.  Recommended hypoallergenic soaps and detergents.  She is to wear loosefitting cotton underwear.  Recommended she abstain from sex until results are available.  Discussed the importance of safe sex practices.  If she develops any additional symptoms including pelvic pain, abdominal pain, fever, nausea, vomiting she needs to be seen immediately to which she expressed understanding.  Final Clinical Impressions(s) / UC Diagnoses   Final diagnoses:  Acute vaginitis  Vaginal discharge  Routine screening for STI (sexually transmitted infection)     Discharge Instructions      We are treating you for yeast infection.  Take Diflucan today and another dose in 3 days if your symptoms persist.  Wear loosefitting cotton underwear and use hypoallergenic soaps and detergents.  We will contact you if we need to arrange any additional treatment.  Please monitor your MyChart for results.  Abstain from sex until you receive your results.  You should use a condom with each sexual encounter.  If you develop any worsening symptoms including abdominal pain, fever, nausea, vomiting you need to be seen immediately.  ED Prescriptions     Medication Sig Dispense Auth. Provider   fluconazole (DIFLUCAN) 150 MG tablet Take 1 tablet (150 mg total) by mouth every 3 (three) days as needed. 2 tablet Luvada Salamone, Noberto Retort, PA-C      PDMP not reviewed this encounter.   Jeani Hawking, PA-C 05/09/22 1445

## 2022-05-09 NOTE — Discharge Instructions (Addendum)
We are treating you for yeast infection.  Take Diflucan today and another dose in 3 days if your symptoms persist.  Wear loosefitting cotton underwear and use hypoallergenic soaps and detergents.  We will contact you if we need to arrange any additional treatment.  Please monitor your MyChart for results.  Abstain from sex until you receive your results.  You should use a condom with each sexual encounter.  If you develop any worsening symptoms including abdominal pain, fever, nausea, vomiting you need to be seen immediately.

## 2022-05-09 NOTE — ED Triage Notes (Addendum)
Pt is present today with s/o vaginal discharge and irritation. Pt sx started today. Pt states that she would also like to be tested for HIV and syphilis

## 2022-05-10 LAB — CERVICOVAGINAL ANCILLARY ONLY
Bacterial Vaginitis (gardnerella): NEGATIVE
Candida Glabrata: NEGATIVE
Candida Vaginitis: NEGATIVE
Chlamydia: NEGATIVE
Comment: NEGATIVE
Comment: NEGATIVE
Comment: NEGATIVE
Comment: NEGATIVE
Comment: NEGATIVE
Comment: NORMAL
Neisseria Gonorrhea: NEGATIVE
Trichomonas: POSITIVE — AB

## 2022-05-11 ENCOUNTER — Telehealth (HOSPITAL_COMMUNITY): Payer: Self-pay | Admitting: Emergency Medicine

## 2022-05-11 LAB — HIV ANTIBODY (ROUTINE TESTING W REFLEX): HIV Screen 4th Generation wRfx: NONREACTIVE

## 2022-05-11 LAB — RPR: RPR Ser Ql: NONREACTIVE

## 2022-05-11 MED ORDER — METRONIDAZOLE 500 MG PO TABS: 500.0000 mg | ORAL_TABLET | Freq: Two times a day (BID) | ORAL | 0 refills | Status: DC

## 2022-05-18 ENCOUNTER — Other Ambulatory Visit: Payer: Self-pay

## 2022-05-18 ENCOUNTER — Ambulatory Visit
Admission: EM | Admit: 2022-05-18 | Discharge: 2022-05-18 | Disposition: A | Discharge status: Home / Self Care | Payer: Medicaid Other | Attending: Internal Medicine | Admitting: Internal Medicine

## 2022-05-18 ENCOUNTER — Encounter: Payer: Self-pay | Admitting: Emergency Medicine

## 2022-05-18 DIAGNOSIS — Z113 Encounter for screening for infections with a predominantly sexual mode of transmission: Secondary | ICD-10-CM

## 2022-05-18 DIAGNOSIS — N898 Other specified noninflammatory disorders of vagina: Secondary | ICD-10-CM | POA: Diagnosis not present

## 2022-05-18 NOTE — Discharge Instructions (Signed)
Vaginal swab is pending.  We will call if results are abnormal.  We recommend that you refrain from sexual activity until test results and treatment are complete.

## 2022-05-18 NOTE — ED Provider Notes (Signed)
[name]    CSN: [id number] Arrival date & time: 05/18/22  1142      History   Chief Complaint Chief Complaint  Patient presents with   Vaginal Discharge    HPI Andrea Jenkins is a 26 y.o. female.   Patient presents for persistent vaginal discharge and vaginal odor that has been present for multiple days.  Patient was seen on 05/09/2022 and tested positive for trichomoniasis.  She was treated with metronidazole and took full course of treatment.  She reports no improvement with symptoms.  Denies any new sexual encounters.  Denies pelvic pain, abdominal pain, hematuria, dysuria, urinary frequency, back pain, fever.  Patient also reporting a sore throat that has been present for a few days.  Denies performing any recent oral sex.  Denies any associated upper respiratory symptoms, fever, cough.  She has not taken any medications for symptoms.  Denies any known sick contacts.   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   Bipolar disorder, current episode mixed, moderate (HCC) 03/26/2022   Fatigue 03/19/2022   Overweight (BMI 25.0-29.9) 03/19/2022   Depression    Menorrhagia    Allergic reaction to drug, initial encounter 03/06/2022   Acute cholecystitis 10/30/2021   Anxiety    Alpha thalassemia silent carrier 10/24/2019   LGSIL on Pap smear of cervix 10/20/2016    Past Surgical History:  Procedure Laterality Date   CATARACT EXTRACTION     CHOLECYSTECTOMY N/A 10/30/2021   Procedure: LAPAROSCOPIC CHOLECYSTECTOMY;  Surgeon: Fritzi Mandes, MD;  Location: [facility];  Service: General;  Laterality: N/A;   WISDOM TOOTH EXTRACTION      WRIST SURGERY      OB History     Gravida  3   Para  3   Term  3   Preterm  0   AB  0   Living  3      SAB  0   IAB  0   Ectopic  0   Multiple  0   Live Births  3            Home Medications    Prior to Admission medications   Medication Sig Start Date End Date Taking? Authorizing Provider  ARIPiprazole (ABILIFY) 5 MG tablet Take 1 tablet (5 mg total) by mouth daily. 03/26/22 03/26/23  Nwoko, Tommas Olp, PA  diclofenac (VOLTAREN) 75 MG EC tablet Take 1 tablet (75 mg total) by mouth 2 (two) times daily. Patient not taking: Reported on 04/30/2022 04/21/22   Elson Areas, PA-C  diphenhydrAMINE (BENADRYL) 25 MG tablet Take 25 mg by mouth every 6 (six) hours as needed (for allergic reactions).    [provider]  EPINEPHrine (EPIPEN 2-PAK) 0.3 mg/0.3 mL IJ SOAJ injection Inject 0.3 mg into the muscle as needed for anaphylaxis. 03/06/22   Amin, Loura Halt, MD  famotidine (PEPCID) 20 MG tablet Take 1 tablet (20 mg total) by mouth 2 (two) times daily for 5 days. 03/06/22 03/11/22  Amin, Loura Halt, MD  fluconazole (DIFLUCAN) 150 MG tablet Take 1 tablet (  150 mg total) by mouth every 3 (three) days as needed. Patient not taking: Reported on 05/18/2022 05/09/22   Raspet, Noberto Retort, PA-C  hydrocortisone (ANUSOL-HC) 2.5 % rectal cream Place 1 application. rectally 2 (two) times daily. Patient not taking: Reported on 04/30/2022 03/24/22   Merrilee Jansky, MD  hydrOXYzine (ATARAX) 10 MG tablet Take 1 tablet (10 mg total) by mouth 3 (three) times daily as needed. 03/26/22   Nwoko, Tommas Olp, PA  loratadine (CLARITIN) 10 MG tablet Take 1 tablet (10 mg total) by mouth daily. Patient not taking: Reported on 03/23/2022 02/16/22   Brock Bad, MD  metroNIDAZOLE (FLAGYL) 500 MG tablet Take 1 tablet (500 mg total) by mouth 2 (two) times daily. Patient not taking: Reported on 05/18/2022 05/11/22   Merrilee Jansky, MD  phentermine 37.5 MG capsule Take 1 capsule (37.5 mg total) by mouth  every morning. Patient not taking: Reported on 04/30/2022 02/16/22   Brock Bad, MD  traZODone (DESYREL) 50 MG tablet Take 1 tablet (50 mg total) by mouth at bedtime. 03/26/22   Meta Hatchet, PA    Family History Family History  Problem Relation Age of Onset   Hypertension Mother    Miscarriages / [place] Mother    Hypertension Maternal Grandmother    Asthma Maternal Grandmother    Diabetes Maternal Grandmother    Vision loss Maternal Grandmother    Mental illness Brother    Cancer Maternal Aunt    Mental illness Sister    Heart disease Neg Hx     Social History Social History   Tobacco Use   Smoking status: Former    Packs/day: 0.25    Types: Cigars, Cigarettes    Quit date: 03/11/2017    Years since quitting: 5.1    Passive exposure: Current   Smokeless tobacco: Never  Vaping Use   Vaping Use: Every day  Substance Use Topics   Alcohol use: Yes    Comment: occ   Drug use: Not Currently    Types: Marijuana    Comment: occasioinally     Allergies   Nortrel 1-35 (21) [norethin-eth estrad triphasic] and Peach [prunus persica]   Review of Systems Review of Systems Per HPI  Physical Exam Triage Vital Signs ED Triage Vitals  Enc Vitals Group     BP 05/18/22 1226 116/75     Pulse Rate 05/18/22 1226 89     Resp 05/18/22 1226 18     Temp 05/18/22 1226 98.5 F (36.9 C)     Temp Source 05/18/22 1226 Oral     SpO2 05/18/22 1226 98 %     Weight --      Height --      Head Circumference --      Peak Flow --      Pain Score 05/18/22 1227 0     Pain Loc --      Pain Edu? --      Excl. in GC? --    No data found.  Updated Vital Signs BP 116/75 (BP Location: Left Arm)   Pulse 89   Temp 98.5 F (36.9 C) (Oral)   Resp 18   LMP 05/03/2022 (Approximate)   SpO2 98%   Visual Acuity Right Eye Distance:   Left Eye Distance:   Bilateral Distance:    Right Eye Near:   Left Eye Near:    Bilateral Near:     Physical Exam Constitutional:       General: She  is not in acute distress.    Appearance: Normal appearance. She is not toxic-appearing or diaphoretic.  HENT:     Head: Normocephalic and atraumatic.     Right Ear: Tympanic membrane and ear canal normal.     Left Ear: Tympanic membrane and ear canal normal.     Nose: No congestion.     Mouth/Throat:     Mouth: Mucous membranes are moist.     Pharynx: Oropharyngeal exudate and posterior oropharyngeal erythema present. No pharyngeal swelling or uvula swelling.     Tonsils: No tonsillar exudate or tonsillar abscesses.  Eyes:     Extraocular Movements: Extraocular movements intact.     Conjunctiva/sclera: Conjunctivae normal.     Pupils: Pupils are equal, round, and reactive to light.  Cardiovascular:     Rate and Rhythm: Normal rate and regular rhythm.     Pulses: Normal pulses.     Heart sounds: Normal heart sounds.  Pulmonary:     Effort: Pulmonary effort is normal. No respiratory distress.     Breath sounds: Normal breath sounds. No wheezing.  Abdominal:     General: Abdomen is flat. Bowel sounds are normal.     Palpations: Abdomen is soft.  Genitourinary:    Comments: Patient declined.  Self swab performed. Musculoskeletal:        General: Normal range of motion.     Cervical back: Normal range of motion.  Skin:    General: Skin is warm and dry.  Neurological:     General: No focal deficit present.     Mental Status: She is alert and oriented to person, place, and time. Mental status is at baseline.  Psychiatric:        Mood and Affect: Mood normal.        Behavior: Behavior normal.      UC Treatments / Results  Labs (all labs ordered are listed, but only abnormal results are displayed) Labs Reviewed  CERVICOVAGINAL ANCILLARY ONLY    EKG   Radiology No results found.  Procedures Procedures (including critical care time)  Medications Ordered in UC Medications - No data to display  Initial Impression / Assessment and Plan / UC Course  I have  reviewed the triage vital signs and the nursing notes.  Pertinent labs & imaging results that were available during my care of the patient were reviewed by me and considered in my medical decision making (see chart for details).     Cervicovaginal swab pending.  Will await results to see if trichomoniasis is persistent or if patient could have developed yeast infection from recent antibiotic therapy.  No signs, symptoms, concern for PID.  Suggested pelvic exam but patient declined stating that she was "in a hurry".  Patient advised to refrain from sexual activity until test results and treatment are complete.  Posterior pharynx exam revealed inflammation and erythema.  Suggested strep testing for evaluation.  After explaining to patient the testing and evaluation process, she stated that she did not want any further evaluation or treatment for her sore throat and that she would "come back for evaluation of her sore throat another day" as she was eager to leave.  Discussed risks associated with not getting further evaluation and treatment for this.  Patient voiced understanding and wished to defer treatment for sore throat at this time.  Discussed return precautions.  Patient verbalized understanding and was agreeable with plan. Final Clinical Impressions(s) / UC Diagnoses   Final diagnoses:  Vaginal discharge  Screening examination for venereal disease     Discharge Instructions      Vaginal swab is pending.  We will call if results are abnormal.  We recommend that you refrain from sexual activity until test results and treatment are complete.    ED Prescriptions   None    PDMP not reviewed this encounter.   Gustavus Bryant, [place] 05/18/22 1353

## 2022-05-18 NOTE — ED Triage Notes (Signed)
Pt here for recheck; pt sts still having vaginal discharge; pt sts took treatment for trichomonas

## 2022-05-19 ENCOUNTER — Telehealth (HOSPITAL_COMMUNITY): Payer: Self-pay | Admitting: Emergency Medicine

## 2022-05-19 LAB — CERVICOVAGINAL ANCILLARY ONLY
Bacterial Vaginitis (gardnerella): NEGATIVE
Candida Glabrata: NEGATIVE
Candida Vaginitis: POSITIVE — AB
Chlamydia: NEGATIVE
Comment: NEGATIVE
Comment: NEGATIVE
Comment: NEGATIVE
Comment: NEGATIVE
Comment: NEGATIVE
Comment: NORMAL
Neisseria Gonorrhea: POSITIVE — AB
Trichomonas: NEGATIVE

## 2022-05-19 MED ORDER — FLUCONAZOLE 150 MG PO TABS: 150.0000 mg | ORAL_TABLET | Freq: Once | ORAL | 0 refills | Status: AC

## 2022-05-19 NOTE — Telephone Encounter (Signed)
Per protocol, patient will need treatment with IM ROcephin 500mg  for positive Gonorrhea.  Will also need treatment with Diflucan.   Contacted patient by phone.  Verified identity using two identifiers.  Provided positive result.  Reviewed safe sex practices, notifying partners, and refraining from sexual activities for 7 days from time of treatment.  Patient verified understanding, all questions answered.   Verified pharmacy, prescription sent

## 2022-05-21 NOTE — Progress Notes (Deleted)
NEW Andrea Jenkins Date of Service/Encounter:  05/21/22 Referring provider: {Blank single:19197::"Narendra, Nischal, MD","none-self referred"} Primary care provider: Patient, No Pcp Per  Subjective:  Andrea Jenkins is a 26 y.o. female with a PMHx of bipolar disorder, alpha thalassemia silent carrier, insomnia, anxiety presenting today for evaluation of "allergies" History obtained from: chart review and {Persons; PED relatives w/Andrea Jenkins:[id number]::"Andrea Jenkins"}.   Andrea Jenkins evaluated on 03/18/2022 for history of allergic reaction.  Andrea Jenkins had taken Zoloft, OCP Notrel and phentermine as per usual, and within minutes developed welts and sensation of throat closing off.  Required 2 rounds of epinephrine with improvement in symptoms.  Peach allergy: *** Other allergy screening: Asthma: {Blank single:19197::"yes","no"} Rhino conjunctivitis: {Blank single:19197::"yes","no"} Food allergy: {Blank single:19197::"yes","no"} Medication allergy: {Blank single:19197::"yes","no"} Hymenoptera allergy: {Blank single:19197::"yes","no"} Urticaria: {Blank single:19197::"yes","no"} Eczema:{Blank single:19197::"yes","no"} History of recurrent infections suggestive of immunodeficency: {Blank single:19197::"yes","no"} ***Vaccinations are up to date.   Past Medical History: Past Medical History:  Diagnosis Date   Anxiety    Asthma    pt states she doesn't use inhaler much (04/10/19)   Depression    Gonorrhea    Heart murmur    when younger   Vaginal Pap smear, abnormal    Medication List:  Current Outpatient Medications  Medication Sig Dispense Refill   ARIPiprazole (ABILIFY) 5 MG tablet Take 1 tablet (5 mg total) by mouth daily. 30 tablet 2   diclofenac (VOLTAREN) 75 MG EC tablet Take 1 tablet (75 mg total) by mouth 2 (two) times daily. (Andrea Jenkins not taking: Reported on 04/30/2022) 14 tablet 0   diphenhydrAMINE (BENADRYL) 25 MG tablet Take 25 mg by mouth every 6 (six) hours as needed (for allergic reactions).      EPINEPHrine (EPIPEN 2-PAK) 0.3 mg/0.3 mL IJ SOAJ injection Inject 0.3 mg into the muscle as needed for anaphylaxis. 1 each 1   famotidine (PEPCID) 20 MG tablet Take 1 tablet (20 mg total) by mouth 2 (two) times daily for 5 days. 10 tablet 0   hydrocortisone (ANUSOL-HC) 2.5 % rectal cream Place 1 application. rectally 2 (two) times daily. (Andrea Jenkins not taking: Reported on 04/30/2022) 30 g 0   hydrOXYzine (ATARAX) 10 MG tablet Take 1 tablet (10 mg total) by mouth 3 (three) times daily as needed. 75 tablet 2   loratadine (CLARITIN) 10 MG tablet Take 1 tablet (10 mg total) by mouth daily. (Andrea Jenkins not taking: Reported on 03/23/2022) 30 tablet 11   metroNIDAZOLE (FLAGYL) 500 MG tablet Take 1 tablet (500 mg total) by mouth 2 (two) times daily. (Andrea Jenkins not taking: Reported on 05/18/2022) 14 tablet 0   phentermine 37.5 MG capsule Take 1 capsule (37.5 mg total) by mouth every morning. (Andrea Jenkins not taking: Reported on 04/30/2022) 30 capsule 2   traZODone (DESYREL) 50 MG tablet Take 1 tablet (50 mg total) by mouth at bedtime. 30 tablet 2   No current facility-administered medications for this visit.   Known Allergies:  Allergies  Allergen Reactions   Nortrel 1-35 (21) [Norethin-Eth Estrad Triphasic] Anaphylaxis   Peach [Prunus Persica] Hives and Itching   Past Surgical History: Past Surgical History:  Procedure Laterality Date   CATARACT EXTRACTION     CHOLECYSTECTOMY N/A 10/30/2021   Procedure: LAPAROSCOPIC CHOLECYSTECTOMY;  Surgeon: Fritzi Mandes, MD;  Location: [facility];  Service: General;  Laterality: N/A;   WISDOM TOOTH EXTRACTION     WRIST SURGERY     Family History: Family History  Problem Relation Age of Onset   Hypertension Mother    Miscarriages / [place] Mother  Hypertension Maternal Grandmother    Asthma Maternal Grandmother    Diabetes Maternal Grandmother    Vision loss Maternal Grandmother    Mental illness Brother    Cancer Maternal Aunt    Mental illness Sister    Heart  disease Neg Hx    Social History: Andrea Jenkins lives ***.   ROS:  All other systems negative except as noted per HPI.  Objective:  Last menstrual period 05/03/2022, not currently breastfeeding. There is no height or weight on file to calculate BMI. Physical Exam:  General Appearance:  Alert, cooperative, no distress, appears stated age  Head:  Normocephalic, without obvious abnormality, atraumatic  Eyes:  Conjunctiva clear, EOM's intact  Nose: Nares normal, {Blank multiple:19196:a:"***","hypertrophic turbinates","normal mucosa","no visible anterior polyps","septum midline"}  Throat: Lips, tongue normal; teeth and gums normal, {Blank multiple:19196:a:"***","normal posterior oropharynx","tonsils 2+","tonsils 3+","no tonsillar exudate","+ cobblestoning"}  Neck: Supple, symmetrical  Lungs:   {Blank multiple:19196:a:"***","clear to auscultation bilaterally","end-expiratory wheezing","wheezing throughout"}, Respirations unlabored, {Blank multiple:19196:a:"***","no coughing","intermittent dry coughing"}  Heart:  {Blank multiple:19196:a:"***","regular rate and rhythm","no murmur"}, Appears well perfused  Extremities: No edema  Skin: Skin color, texture, turgor normal, no rashes or lesions on visualized portions of skin  Neurologic: No gross deficits     Diagnostics: Spirometry:  Tracings reviewed. Her effort: {Blank single:19197::"Good reproducible efforts.","It was hard to get consistent efforts and there is a question as to whether this reflects a maximal maneuver.","Poor effort, data can not be interpreted.","Variable effort-results affected.","decent for first attempt at spirometry."} FVC: ***L (pre), ***L  (post) FEV1: ***L, ***% predicted (pre), ***L, ***% predicted (post) FEV1/FVC ratio: ***% (pre), ***% (post) Interpretation: {Blank single:19197::"Spirometry consistent with mild obstructive disease","Spirometry consistent with moderate obstructive disease","Spirometry consistent with  severe obstructive disease","Spirometry consistent with possible restrictive disease","Spirometry consistent with mixed obstructive and restrictive disease","Spirometry uninterpretable due to technique","Spirometry consistent with normal pattern","No overt abnormalities noted given today's efforts"} with *** bronchodilator response  Skin Testing: {Blank single:19197::"Select foods","Environmental allergy panel","Environmental allergy panel and select foods","Food allergy panel","None","Deferred due to recent antihistamines use"}. *** Adequate controls. Results discussed with Andrea Jenkins/family.   {Blank single:19197::"Allergy testing results were read and interpreted by myself, documented by clinical staff."," "}  Assessment and Plan  ***  {Blank single:19197::"This note in its entirety was forwarded to the Provider who requested this consultation."}  Thank you for your kind referral. I appreciate the opportunity to take part in Yola's care. Please do not hesitate to contact me with questions.***  Sincerely,  Tonny Bollman, MD [facility]

## 2022-05-23 ENCOUNTER — Ambulatory Visit: Payer: Self-pay

## 2022-05-24 ENCOUNTER — Ambulatory Visit
Admission: RE | Admit: 2022-05-24 | Discharge: 2022-05-24 | Disposition: A | Discharge status: Home / Self Care | Payer: Medicaid Other | Source: Ambulatory Visit | Attending: Physician Assistant | Admitting: Physician Assistant

## 2022-05-24 DIAGNOSIS — A549 Gonococcal infection, unspecified: Secondary | ICD-10-CM | POA: Diagnosis not present

## 2022-05-24 MED ORDER — CEFTRIAXONE SODIUM 1 G IJ SOLR
1.0000 g | Freq: Once | INTRAMUSCULAR | Status: AC
  Administered 2022-05-24: 1 g via INTRAMUSCULAR

## 2022-05-24 NOTE — ED Triage Notes (Signed)
PT presents to uc requesting tx for sti

## 2022-05-26 ENCOUNTER — Ambulatory Visit: Payer: Medicaid Other | Admitting: Internal Medicine

## 2022-05-26 ENCOUNTER — Ambulatory Visit (INDEPENDENT_AMBULATORY_CARE_PROVIDER_SITE_OTHER): Payer: Medicaid Other | Admitting: Emergency Medicine

## 2022-05-26 VITALS — BP 120/78 | HR 96 | Wt 158.3 lb

## 2022-05-26 DIAGNOSIS — Z3200 Encounter for pregnancy test, result unknown: Secondary | ICD-10-CM

## 2022-05-26 DIAGNOSIS — Z3201 Encounter for pregnancy test, result positive: Secondary | ICD-10-CM

## 2022-05-26 LAB — POCT URINE PREGNANCY: Preg Test, Ur: POSITIVE — AB

## 2022-05-26 NOTE — Progress Notes (Signed)
[name] presents today for UPT. She has no unusual complaints. LMP:     OBJECTIVE: Appears well, in no apparent distress.  OB History     Gravida  3   Para  3   Term  3   Preterm  0   AB  0   Living  3      SAB  0   IAB  0   Ectopic  0   Multiple  0   Live Births  3          Home UPT Result: Positive In-Office UPT result: Positive I have reviewed the patient's medical, obstetrical, social, and family histories, and medications.   ASSESSMENT: Positive pregnancy test  PLAN Prenatal care to be completed at: [facility]

## 2022-05-30 ENCOUNTER — Other Ambulatory Visit: Payer: Self-pay

## 2022-05-30 ENCOUNTER — Encounter (HOSPITAL_COMMUNITY): Payer: Self-pay | Admitting: Obstetrics and Gynecology

## 2022-05-30 ENCOUNTER — Inpatient Hospital Stay (HOSPITAL_COMMUNITY)
Admission: AD | Admit: 2022-05-30 | Discharge: 2022-05-30 | Disposition: A | Discharge status: Home / Self Care | Payer: Medicaid Other | Attending: Obstetrics and Gynecology | Admitting: Obstetrics and Gynecology

## 2022-05-30 ENCOUNTER — Inpatient Hospital Stay (HOSPITAL_COMMUNITY): Payer: Medicaid Other

## 2022-05-30 DIAGNOSIS — M545 Low back pain, unspecified: Secondary | ICD-10-CM | POA: Diagnosis not present

## 2022-05-30 DIAGNOSIS — Z91148 Patient's other noncompliance with medication regimen for other reason: Secondary | ICD-10-CM | POA: Insufficient documentation

## 2022-05-30 DIAGNOSIS — O26891 Other specified pregnancy related conditions, first trimester: Secondary | ICD-10-CM | POA: Diagnosis not present

## 2022-05-30 DIAGNOSIS — R1032 Left lower quadrant pain: Secondary | ICD-10-CM | POA: Diagnosis not present

## 2022-05-30 DIAGNOSIS — Z3A01 Less than 8 weeks gestation of pregnancy: Secondary | ICD-10-CM | POA: Diagnosis not present

## 2022-05-30 DIAGNOSIS — O3680X0 Pregnancy with inconclusive fetal viability, not applicable or unspecified: Secondary | ICD-10-CM | POA: Diagnosis not present

## 2022-05-30 LAB — WET PREP, GENITAL
Clue Cells Wet Prep HPF POC: NONE SEEN
Sperm: NONE SEEN
Trich, Wet Prep: NONE SEEN
WBC, Wet Prep HPF POC: >=10 — AB (ref <10)
Yeast Wet Prep HPF POC: NONE SEEN

## 2022-05-30 LAB — URINALYSIS, ROUTINE W REFLEX MICROSCOPIC
Bilirubin Urine: NEGATIVE
Glucose, UA: NEGATIVE mg/dL
Hgb urine dipstick: NEGATIVE
Ketones, ur: NEGATIVE mg/dL
Leukocytes,Ua: NEGATIVE
Nitrite: NEGATIVE
Protein, ur: NEGATIVE mg/dL
Specific Gravity, Urine: 1.025 (ref 1.005–1.030)
pH: 6 (ref 5.0–8.0)

## 2022-05-30 LAB — CBC
HCT: 37.5 % (ref 36.0–46.0)
Hemoglobin: 12.4 g/dL (ref 12.0–15.0)
MCH: 27.7 pg (ref 26.0–34.0)
MCHC: 33.1 g/dL (ref 30.0–36.0)
MCV: 83.9 fL (ref 80.0–100.0)
Platelets: 206 10*3/uL (ref 150–400)
RBC: 4.47 MIL/uL (ref 3.87–5.11)
RDW: 13.1 % (ref 11.5–15.5)
WBC: 4.2 10*3/uL (ref 4.0–10.5)
nRBC: 0 % (ref 0.0–0.2)

## 2022-05-30 LAB — COMPREHENSIVE METABOLIC PANEL
ALT: 13 U/L (ref 0–44)
AST: 16 U/L (ref 15–41)
Albumin: 4.1 g/dL (ref 3.5–5.0)
Alkaline Phosphatase: 39 U/L (ref 38–126)
Anion gap: 5 (ref 5–15)
BUN: <5 mg/dL — ABNORMAL LOW (ref 6–20)
CO2: 24 mmol/L (ref 22–32)
Calcium: 9.6 mg/dL (ref 8.9–10.3)
Chloride: 108 mmol/L (ref 98–111)
Creatinine, Ser: 0.75 mg/dL (ref 0.44–1.00)
GFR, Estimated: >60 mL/min (ref >60)
Glucose, Bld: 85 mg/dL (ref 70–99)
Potassium: 4.5 mmol/L (ref 3.5–5.1)
Sodium: 137 mmol/L (ref 135–145)
Total Bilirubin: 0.8 mg/dL (ref 0.3–1.2)
Total Protein: 7.1 g/dL (ref 6.5–8.1)

## 2022-05-30 LAB — HCG, QUANTITATIVE, PREGNANCY: hCG, Beta Chain, Quant, S: 212 m[IU]/mL — ABNORMAL HIGH (ref <5)

## 2022-05-30 MED ORDER — PREPLUS 27-1 MG PO TABS: 1.0000 | ORAL_TABLET | Freq: Every day | ORAL | 13 refills | Status: DC

## 2022-05-30 NOTE — MAU Note (Signed)
Pt reports to mau with c/o lower left abd pain that radiates to her back and hip.  Pt denies bleeding.

## 2022-05-30 NOTE — Discharge Instructions (Signed)

## 2022-05-30 NOTE — MAU Provider Note (Signed)
History     CSN: [id number]  Arrival date and time: 05/30/22 1232   Event Date/Time   First Provider Initiated Contact with Patient 05/30/22 1458      Chief Complaint  Patient presents with   Abdominal Pain   Back Pain   HPI Andrea Jenkins is a 26 y.o. G4P3003 at by LMP who presents to MAU with chief complaints of abdominal pain and low back pain. These are new problems, recent onset. Patient denies vaginal bleeding, dysuria, fever or recent illness. Pain score is 6/10 in her abdomen. She denies aggravating or alleviating factors. She has not taken medication for this complaint and declines medication in MAU.   OB History     Gravida  4   Para  3   Term  3   Preterm  0   AB  0   Living  3      SAB  0   IAB  0   Ectopic  0   Multiple  0   Live Births  3           Past Medical History:  Diagnosis Date   Anxiety    Asthma    pt states she doesn't use inhaler much (04/10/19)   Depression    Gonorrhea    Heart murmur    when younger   Vaginal Pap smear, abnormal     Past Surgical History:  Procedure Laterality Date   CATARACT EXTRACTION     CHOLECYSTECTOMY N/A 10/30/2021   Procedure: LAPAROSCOPIC CHOLECYSTECTOMY;  Surgeon: Fritzi Mandes, MD;  Location: [facility];  Service: General;  Laterality: N/A;   WISDOM TOOTH EXTRACTION     WRIST SURGERY      Family History  Problem Relation Age of Onset   Hypertension Mother    Miscarriages / [place] Mother    Hypertension Maternal Grandmother    Asthma Maternal Grandmother    Diabetes Maternal Grandmother    Vision loss Maternal Grandmother    Mental illness Brother    Cancer Maternal Aunt    Mental illness Sister    Heart disease Neg Hx     Social History   Tobacco Use   Smoking status: Former    Packs/day: 0.25    Types: Cigars, Cigarettes    Quit date: 03/11/2017    Years since quitting: 5.2    Passive exposure: Current   Smokeless tobacco: Never  Vaping Use   Vaping Use: Every day   Substance Use Topics   Alcohol use: Yes    Comment: occ   Drug use: Not Currently    Types: Marijuana    Comment: occasioinally    Allergies:  Allergies  Allergen Reactions   Nortrel 1-35 (21) [Norethin-Eth Estrad Triphasic] Anaphylaxis   Peach [Prunus Persica] Hives and Itching    No medications prior to admission.    Review of Systems  Gastrointestinal:  Positive for abdominal pain.  Musculoskeletal:  Positive for back pain.  All other systems reviewed and are negative.  Physical Exam   Blood pressure 118/63, pulse 61, temperature 98.3 F (36.8 C), temperature source Oral, resp. rate 12, last menstrual period 05/03/2022, SpO2 100 %, not currently breastfeeding.  Physical Exam  MAU Course  Procedures  MDM Orders Placed This Encounter  Procedures   Wet prep, genital   US OB LESS THAN 14 WEEKS WITH OB TRANSVAGINAL   Urinalysis, Routine w reflex microscopic Urine, Clean Catch   CBC   Comprehensive metabolic panel  hCG, quantitative, pregnancy   Discharge patient   Patient Vitals for the past 24 hrs:  BP Temp Temp src Pulse Resp SpO2  05/30/22 1510 -- -- -- -- -- 100 %  05/30/22 1508 118/63 98.3 F (36.8 C) Oral 61 12 --  05/30/22 1330 -- -- -- -- -- 100 %  05/30/22 1326 110/63 -- -- 71 -- --  05/30/22 1311 111/65 98.3 F (36.8 C) -- 77 15 100 %   Results for orders placed or performed during the hospital encounter of 05/30/22 (from the past 24 hour(s))  Urinalysis, Routine w reflex microscopic Urine, Clean Catch     Status: None   Collection Time: 05/30/22  1:14 PM  Result Value Ref Range   Color, Urine YELLOW YELLOW   APPearance CLEAR CLEAR   Specific Gravity, Urine 1.025 1.005 - 1.030   pH 6.0 5.0 - 8.0   Glucose, UA NEGATIVE NEGATIVE mg/dL   Hgb urine dipstick NEGATIVE NEGATIVE   Bilirubin Urine NEGATIVE NEGATIVE   Ketones, ur NEGATIVE NEGATIVE mg/dL   Protein, ur NEGATIVE NEGATIVE mg/dL   Nitrite NEGATIVE NEGATIVE   Leukocytes,Ua NEGATIVE  NEGATIVE  Wet prep, genital     Status: Abnormal   Collection Time: 05/30/22  1:30 PM  Result Value Ref Range   Yeast Wet Prep HPF POC NONE SEEN NONE SEEN   Trich, Wet Prep NONE SEEN NONE SEEN   Clue Cells Wet Prep HPF POC NONE SEEN NONE SEEN   WBC, Wet Prep HPF POC >=10 (A) <10   Sperm NONE SEEN   CBC     Status: None   Collection Time: 05/30/22  1:37 PM  Result Value Ref Range   WBC 4.2 4.0 - 10.5 K/uL   RBC 4.47 3.87 - 5.11 MIL/uL   Hemoglobin 12.4 12.0 - 15.0 g/dL   HCT 69.4 85.4 - 62.7 %   MCV 83.9 80.0 - 100.0 fL   MCH 27.7 26.0 - 34.0 pg   MCHC 33.1 30.0 - 36.0 g/dL   RDW 03.5 00.9 - 38.1 %   Platelets 206 150 - 400 K/uL   nRBC 0.0 0.0 - 0.2 %  Comprehensive metabolic panel     Status: Abnormal   Collection Time: 05/30/22  1:37 PM  Result Value Ref Range   Sodium 137 135 - 145 mmol/L   Potassium 4.5 3.5 - 5.1 mmol/L   Chloride 108 98 - 111 mmol/L   CO2 24 22 - 32 mmol/L   Glucose, Bld 85 70 - 99 mg/dL   BUN <5 (L) 6 - 20 mg/dL   Creatinine, Ser 8.29 0.44 - 1.00 mg/dL   Calcium 9.6 8.9 - 93.7 mg/dL   Total Protein 7.1 6.5 - 8.1 g/dL   Albumin 4.1 3.5 - 5.0 g/dL   AST 16 15 - 41 U/L   ALT 13 0 - 44 U/L   Alkaline Phosphatase 39 38 - 126 U/L   Total Bilirubin 0.8 0.3 - 1.2 mg/dL   GFR, Estimated >16 >96 mL/min   Anion gap 5 5 - 15  hCG, quantitative, pregnancy     Status: Abnormal   Collection Time: 05/30/22  1:37 PM  Result Value Ref Range   hCG, Beta Chain, Quant, S 212 (H) <5 mIU/mL   US OB LESS THAN 14 WEEKS WITH OB TRANSVAGINAL  Result Date: 05/30/2022 CLINICAL DATA:  Left lower quadrant pain. Patient reportedly pregnant, 3 weeks and 6 days based on the last menstrual period. EXAM: OBSTETRIC <14 WK [place]  AND TRANSVAGINAL OB US TECHNIQUE: Both transabdominal and transvaginal ultrasound examinations were performed for complete evaluation of the gestation as well as the maternal uterus, adnexal regions, and pelvic cul-de-sac. Transvaginal technique was performed to  assess early pregnancy. COMPARISON:  None Available. FINDINGS: Intrauterine gestational sac: None Yolk sac:  Not Visualized. Embryo:  Not Visualized. Maternal uterus/adnexae: Years normal in size. No mass. Endometrium 9-10 mm in thickness. Small amount of endometrial canal fluid. Cervix is closed. Ovaries and adnexa are unremarkable. Trace pelvic free fluid consistent with physiologic fluid. IMPRESSION: 1. No visualized intrauterine or ectopic pregnancy. This could reflect a very early pregnancy. Follow-up beta HCG levels, and if rising, follow-up ultrasound in 10-14 days is recommended. 2. Small amount of endometrial canal fluid. Exam otherwise within normal limits. Electronically Signed   By: Amie Portland M.D.   On: 05/30/2022 14:10    Meds ordered this encounter  Medications   Prenatal Vit-Fe Fumarate-FA (PREPLUS) 27-1 MG TABS    Sig: Take 1 tablet by mouth daily.    Dispense:  30 tablet    Refill:  13    Order Specific Question:   Supervising Provider    Answer:   Conan Bowens [[id number]]   Assessment and Plan  --26 y.o. [phone] with pregnancy of unknown location --Quant hcg 212 --Discharge home in stable condition with ectopic precautions  F/U: Appt made for repeat stat Quant hCG Wednesday morning at [facility], MSA, MSN, CNM 05/30/2022, 5:27 PM

## 2022-05-31 LAB — GC/CHLAMYDIA PROBE AMP (~~LOC~~) NOT AT ARMC
Chlamydia: NEGATIVE
Comment: NEGATIVE
Comment: NORMAL
Neisseria Gonorrhea: NEGATIVE

## 2022-06-02 ENCOUNTER — Ambulatory Visit: Payer: Medicaid Other

## 2022-06-02 ENCOUNTER — Telehealth: Payer: Self-pay

## 2022-06-02 DIAGNOSIS — O3680X0 Pregnancy with inconclusive fetal viability, not applicable or unspecified: Secondary | ICD-10-CM

## 2022-06-02 LAB — BETA HCG QUANT (REF LAB): hCG Quant: 859 m[IU]/mL

## 2022-06-02 NOTE — Progress Notes (Unsigned)
History   Chief Complaint:  STAT HCG   Andrea Jenkins is  26 y.o. [id number] Patient's last menstrual period was 05/03/2022 (approximate).. Patient is here for follow up of quantitative HCG and ongoing surveillance of pregnancy status.   She is [redacted]w[redacted]d weeks gestation  by LMP.    Since her last visit, the patient is without new complaint.   The patient DENIES having any bleeding.  General ROS:  positive for intermittent back and pelvic pain rating 6/10 , but states no pain right now.  Her previous Quantitative HCG values are: 212   Ultrasound Studies:   US OB LESS THAN 14 WEEKS WITH OB TRANSVAGINAL  Result Date: 05/30/2022 CLINICAL DATA:  Left lower quadrant pain. Patient reportedly pregnant, 3 weeks and 6 days based on the last menstrual period. EXAM: OBSTETRIC <14 WK [place] AND TRANSVAGINAL OB US TECHNIQUE: Both transabdominal and transvaginal ultrasound examinations were performed for complete evaluation of the gestation as well as the maternal uterus, adnexal regions, and pelvic cul-de-sac. Transvaginal technique was performed to assess early pregnancy. COMPARISON:  None Available. FINDINGS: Intrauterine gestational sac: None Yolk sac:  Not Visualized. Embryo:  Not Visualized. Maternal uterus/adnexae: Years normal in size. No mass. Endometrium 9-10 mm in thickness. Small amount of endometrial canal fluid. Cervix is closed. Ovaries and adnexa are unremarkable. Trace pelvic free fluid consistent with physiologic fluid. IMPRESSION: 1. No visualized intrauterine or ectopic pregnancy. This could reflect a very early pregnancy. Follow-up beta HCG levels, and if rising, follow-up ultrasound in 10-14 days is recommended. 2. Small amount of endometrial canal fluid. Exam otherwise within normal limits. Electronically Signed   By: Amie Portland M.D.   On: 05/30/2022 14:10    Assessment:  [redacted]w[redacted]d weeks gestation here for ongoing surveillance of pregnancy.  Plan: The patient is instructed to follow up in {follow  up:32580} ***.

## 2022-06-02 NOTE — Telephone Encounter (Signed)
Called pt to advise of HCG results and need for f/u ultrasound. No answer, left vm.  U/S order placed

## 2022-06-03 NOTE — Progress Notes (Signed)
S/w pt today and advised of results and need for f/u ultrasound.

## 2022-06-05 ENCOUNTER — Inpatient Hospital Stay (HOSPITAL_COMMUNITY): Payer: Medicaid Other

## 2022-06-05 ENCOUNTER — Inpatient Hospital Stay (HOSPITAL_COMMUNITY)
Admission: AD | Admit: 2022-06-05 | Discharge: 2022-06-05 | Disposition: A | Discharge status: Home / Self Care | Payer: Medicaid Other | Attending: Obstetrics and Gynecology | Admitting: Obstetrics and Gynecology

## 2022-06-05 ENCOUNTER — Encounter (HOSPITAL_COMMUNITY): Payer: Self-pay | Admitting: Obstetrics and Gynecology

## 2022-06-05 DIAGNOSIS — O99891 Other specified diseases and conditions complicating pregnancy: Secondary | ICD-10-CM | POA: Insufficient documentation

## 2022-06-05 DIAGNOSIS — M545 Low back pain, unspecified: Secondary | ICD-10-CM | POA: Insufficient documentation

## 2022-06-05 DIAGNOSIS — R102 Pelvic and perineal pain: Secondary | ICD-10-CM | POA: Insufficient documentation

## 2022-06-05 DIAGNOSIS — O26891 Other specified pregnancy related conditions, first trimester: Secondary | ICD-10-CM | POA: Insufficient documentation

## 2022-06-05 DIAGNOSIS — O99331 Smoking (tobacco) complicating pregnancy, first trimester: Secondary | ICD-10-CM | POA: Insufficient documentation

## 2022-06-05 DIAGNOSIS — O99341 Other mental disorders complicating pregnancy, first trimester: Secondary | ICD-10-CM | POA: Diagnosis not present

## 2022-06-05 DIAGNOSIS — R110 Nausea: Secondary | ICD-10-CM | POA: Diagnosis not present

## 2022-06-05 DIAGNOSIS — O208 Other hemorrhage in early pregnancy: Secondary | ICD-10-CM | POA: Diagnosis not present

## 2022-06-05 DIAGNOSIS — Z3201 Encounter for pregnancy test, result positive: Secondary | ICD-10-CM | POA: Diagnosis not present

## 2022-06-05 DIAGNOSIS — Z3A01 Less than 8 weeks gestation of pregnancy: Secondary | ICD-10-CM | POA: Insufficient documentation

## 2022-06-05 DIAGNOSIS — R630 Anorexia: Secondary | ICD-10-CM | POA: Diagnosis not present

## 2022-06-05 DIAGNOSIS — Z3490 Encounter for supervision of normal pregnancy, unspecified, unspecified trimester: Secondary | ICD-10-CM

## 2022-06-05 LAB — CBC
HCT: 34 % — ABNORMAL LOW (ref 36.0–46.0)
Hemoglobin: 11.3 g/dL — ABNORMAL LOW (ref 12.0–15.0)
MCH: 27.7 pg (ref 26.0–34.0)
MCHC: 33.2 g/dL (ref 30.0–36.0)
MCV: 83.3 fL (ref 80.0–100.0)
Platelets: 211 10*3/uL (ref 150–400)
RBC: 4.08 MIL/uL (ref 3.87–5.11)
RDW: 13.1 % (ref 11.5–15.5)
WBC: 5.6 10*3/uL (ref 4.0–10.5)
nRBC: 0 % (ref 0.0–0.2)

## 2022-06-05 LAB — URINALYSIS, ROUTINE W REFLEX MICROSCOPIC
Bilirubin Urine: NEGATIVE
Glucose, UA: NEGATIVE mg/dL
Hgb urine dipstick: NEGATIVE
Ketones, ur: NEGATIVE mg/dL
Leukocytes,Ua: NEGATIVE
Nitrite: NEGATIVE
Protein, ur: NEGATIVE mg/dL
Specific Gravity, Urine: 1.025 (ref 1.005–1.030)
pH: 6 (ref 5.0–8.0)

## 2022-06-05 LAB — HCG, QUANTITATIVE, PREGNANCY: hCG, Beta Chain, Quant, S: 5806 m[IU]/mL — ABNORMAL HIGH (ref <5)

## 2022-06-05 MED ORDER — PROMETHAZINE HCL 12.5 MG PO TABS: 12.5000 mg | ORAL_TABLET | Freq: Four times a day (QID) | ORAL | 0 refills | Status: DC | PRN

## 2022-06-05 MED ORDER — PYRIDOXINE HCL 25 MG PO TABS: 25.0000 mg | ORAL_TABLET | Freq: Three times a day (TID) | ORAL | 0 refills | Status: DC

## 2022-06-05 NOTE — Discharge Instructions (Signed)

## 2022-06-05 NOTE — MAU Provider Note (Addendum)
History     CSN: [id number]  Arrival date and time: 06/05/22 2036   Event Date/Time   First Provider Initiated Contact with Patient 06/05/22 2224      Chief Complaint  Patient presents with   Abdominal Pain   Back Pain   HPI Andrea Jenkins is a 26 y.o. G4P3003 at [redacted]w[redacted]d by LMP who presents to MAU with chief complaints of abdominal and low back pain. These are recurrent problems, onset early August. Patient's suprapubic abdominal pain is unchanged since onset but her low back pain is worsening over time. Pain score for back pain is 7/10. She has attempted management with Tylenol and experienced mild relief. She denies vaginal bleeding, dysuria, fever or recent illness.  Patient also reports new onset nausea and loss of appetite. She requests medication to help with this.  OB History     Gravida  4   Para  3   Term  3   Preterm  0   AB  0   Living  3      SAB  0   IAB  0   Ectopic  0   Multiple  0   Live Births  3           Past Medical History:  Diagnosis Date   Anxiety    Asthma    pt states she doesn't use inhaler much (04/10/19)   Depression    Gonorrhea    Heart murmur    when younger   Vaginal Pap smear, abnormal     Past Surgical History:  Procedure Laterality Date   CATARACT EXTRACTION     CHOLECYSTECTOMY N/A 10/30/2021   Procedure: LAPAROSCOPIC CHOLECYSTECTOMY;  Surgeon: Fritzi Mandes, MD;  Location: [facility];  Service: General;  Laterality: N/A;   WISDOM TOOTH EXTRACTION     WRIST SURGERY      Family History  Problem Relation Age of Onset   Hypertension Mother    Miscarriages / [place] Mother    Hypertension Maternal Grandmother    Asthma Maternal Grandmother    Diabetes Maternal Grandmother    Vision loss Maternal Grandmother    Mental illness Brother    Cancer Maternal Aunt    Mental illness Sister    Heart disease Neg Hx     Social History   Tobacco Use   Smoking status: Former    Packs/day: 0.25    Types:  Cigars, Cigarettes    Quit date: 03/11/2017    Years since quitting: 5.2    Passive exposure: Current   Smokeless tobacco: Never  Vaping Use   Vaping Use: Every day  Substance Use Topics   Alcohol use: Not Currently    Comment: occ   Drug use: Not Currently    Types: Marijuana    Comment: occasioinally    Allergies:  Allergies  Allergen Reactions   Nortrel 1-35 (21) [Norethin-Eth Estrad Triphasic] Anaphylaxis   Peach [Prunus Persica] Hives and Itching    Medications Prior to Admission  Medication Sig Dispense Refill Last Dose   ARIPiprazole (ABILIFY) 5 MG tablet Take 1 tablet (5 mg total) by mouth daily. 30 tablet 2 Past Week   hydrOXYzine (ATARAX) 10 MG tablet Take 1 tablet (10 mg total) by mouth 3 (three) times daily as needed. 75 tablet 2 Past Week   Prenatal Vit-Fe Fumarate-FA (PREPLUS) 27-1 MG TABS Take 1 tablet by mouth daily. 30 tablet 13 Past Week   EPINEPHrine (EPIPEN 2-PAK) 0.3 mg/0.3 mL IJ SOAJ  injection Inject 0.3 mg into the muscle as needed for anaphylaxis. 1 each 1    famotidine (PEPCID) 20 MG tablet Take 1 tablet (20 mg total) by mouth 2 (two) times daily for 5 days. 10 tablet 0    loratadine (CLARITIN) 10 MG tablet Take 1 tablet (10 mg total) by mouth daily. (Patient not taking: Reported on 03/23/2022) 30 tablet 11    traZODone (DESYREL) 50 MG tablet Take 1 tablet (50 mg total) by mouth at bedtime. (Patient not taking: Reported on 06/02/2022) 30 tablet 2     Review of Systems  Gastrointestinal:  Positive for abdominal pain.  Musculoskeletal:  Positive for back pain.  All other systems reviewed and are negative.  Physical Exam   Blood pressure 110/62, pulse 69, temperature 98.7 F (37.1 C), temperature source Oral, resp. rate 15, height 5\' 3"  (1.6 m), weight 72.3 kg, last menstrual period 05/03/2022, SpO2 100 %, not currently breastfeeding.  Physical Exam Vitals and nursing note reviewed. Exam conducted with a chaperone present.  Constitutional:      General:  She is not in acute distress.    Appearance: She is well-developed. She is not ill-appearing.  Cardiovascular:     Rate and Rhythm: Normal rate and regular rhythm.     Heart sounds: Normal heart sounds.  Pulmonary:     Effort: Pulmonary effort is normal.     Breath sounds: Normal breath sounds.  Abdominal:     General: Abdomen is flat. Bowel sounds are normal.     Palpations: Abdomen is soft.     Tenderness: There is no abdominal tenderness. There is no right CVA tenderness, left CVA tenderness, guarding or rebound.  Neurological:     Mental Status: She is alert.     MAU Course  Procedures  MDM --EMR reviewed. Previous pregnancy of unknown location on TVUS 05/30/2022 --Appropriate rise in Quant hCG 08/06-08/09 --Weight stable, nausea without vomiting. Advised pre-medicating with B6 around the clock, Phenergan as needed  Orders Placed This Encounter  Procedures   05-[birth year]1979 OB Transvaginal   US OB Transvaginal   Urinalysis, Routine w reflex microscopic Urine, Clean Catch   CBC   hCG, quantitative, pregnancy   Diet NPO time specified   Discharge patient   Patient Vitals for the past 24 hrs:  BP Temp Temp src Pulse Resp SpO2 Height Weight  06/05/22 2235 119/69 -- -- 81 -- -- -- --  06/05/22 2115 110/62 -- -- 69 -- -- -- --  06/05/22 2050 114/64 98.7 F (37.1 C) Oral 84 15 100 % 5\' 3"  (1.6 m) 72.3 kg   Results for orders placed or performed during the hospital encounter of 06/05/22 (from the past 24 hour(s))  Urinalysis, Routine w reflex microscopic Urine, Clean Catch     Status: Abnormal   Collection Time: 06/05/22  9:15 PM  Result Value Ref Range   Color, Urine YELLOW YELLOW   APPearance HAZY (A) CLEAR   Specific Gravity, Urine 1.025 1.005 - 1.030   pH 6.0 5.0 - 8.0   Glucose, UA NEGATIVE NEGATIVE mg/dL   Hgb urine dipstick NEGATIVE NEGATIVE   Bilirubin Urine NEGATIVE NEGATIVE   Ketones, ur NEGATIVE NEGATIVE mg/dL   Protein, ur NEGATIVE NEGATIVE mg/dL   Nitrite NEGATIVE  NEGATIVE   Leukocytes,Ua NEGATIVE NEGATIVE  CBC     Status: Abnormal   Collection Time: 06/05/22  9:25 PM  Result Value Ref Range   WBC 5.6 4.0 - 10.5 K/uL   RBC 4.08 3.87 -  5.11 MIL/uL   Hemoglobin 11.3 (L) 12.0 - 15.0 g/dL   HCT 34.0 (L) 36.0 - 46.0 %   MCV 83.3 80.0 - 100.0 fL   MCH 27.7 26.0 - 34.0 pg   MCHC 33.2 30.0 - 36.0 g/dL   RDW 13.1 11.5 - 15.5 %   Platelets 211 150 - 400 K/uL   nRBC 0.0 0.0 - 0.2 %  hCG, quantitative, pregnancy     Status: Abnormal   Collection Time: 06/05/22  9:25 PM  Result Value Ref Range   hCG, Beta Chain, Quant, S 5,806 (H) <5 mIU/mL   US OB Transvaginal  Result Date: 06/05/2022 CLINICAL DATA:  Left lower quadrant pain with positive pregnancy test EXAM: TRANSVAGINAL OB ULTRASOUND TECHNIQUE: Transvaginal ultrasound was performed for complete evaluation of the gestation as well as the maternal uterus, adnexal regions, and pelvic cul-de-sac. COMPARISON:  05/30/2022 FINDINGS: Intrauterine gestational sac: Present Yolk sac:  Present Embryo:  Absent MSD: 6.6 mm   5 w   3 d Subchorionic hemorrhage: Small subchorionic hemorrhage is noted. This measures up to 1.3 cm. Maternal uterus/adnexae: Ovaries are well visualized bilaterally. Corpus luteum cyst is noted on the left. IMPRESSION: Probable early intrauterine gestational sac and no yolk sac, but no fetal pole, or cardiac activity yet visualized. Recommend follow-up quantitative B-HCG levels and follow-up US in 14 days to assess viability. This recommendation follows SRU consensus guidelines: Diagnostic Criteria for Nonviable Pregnancy Early in the First Trimester. [employer] [place] Med [id number]. Small subchorionic hemorrhage noted. Electronically Signed   By: Inez Catalina M.D.   On: 06/05/2022 22:10     Assessment and Plan  --26 y.o. [phone] with confirmed IUP (+GS, +YS) --Subchorionic hematoma, pelvic rest precautions --Nausea without vomiting, new medications regimen, advised small bland snacks throughout  day --Discharge home in stable condition with first trimester precautions  F/U: --Message sent to reschedule 08/21 viability scan for the week of 08/28, patient aware  [name], [place], MSN, CNM 06/05/2022, 11:31 PM

## 2022-06-05 NOTE — MAU Note (Addendum)
..  Andrea Jenkins is a 26 y.o. at [redacted]w[redacted]d here in MAU reporting: Back pain that is getting gradually worse from the last time she was seen, abdominal cramping that has stayed the same intensity. Denies vaginal bleeding. Has had increase in vaginal discharge that looks like "yeast" but not "chunky." Also, she has not been able to keep anything down for three days.   Pain score: back pain 7/10. Abdominal cramping 4/10 Vitals:   06/05/22 2050  BP: 114/64  Pulse: 84  Resp: 15  Temp: 98.7 F (37.1 C)  SpO2: 100%     FHT:not indicated Lab orders placed from triage:  UA

## 2022-06-09 ENCOUNTER — Ambulatory Visit
Admission: RE | Admit: 2022-06-09 | Discharge: 2022-06-09 | Disposition: A | Discharge status: Home / Self Care | Payer: Medicaid Other | Source: Ambulatory Visit | Attending: Obstetrics and Gynecology | Admitting: Obstetrics and Gynecology

## 2022-06-09 ENCOUNTER — Other Ambulatory Visit: Payer: Self-pay

## 2022-06-09 ENCOUNTER — Other Ambulatory Visit: Payer: Self-pay | Admitting: Obstetrics and Gynecology

## 2022-06-09 DIAGNOSIS — O3680X0 Pregnancy with inconclusive fetal viability, not applicable or unspecified: Secondary | ICD-10-CM

## 2022-06-09 DIAGNOSIS — Z3A01 Less than 8 weeks gestation of pregnancy: Secondary | ICD-10-CM | POA: Diagnosis not present

## 2022-06-11 NOTE — ED Provider Notes (Signed)
Nurse visit only- std treatment.    Tomi Bamberger, PA-C 06/11/22 [phone]

## 2022-06-14 ENCOUNTER — Other Ambulatory Visit: Payer: Medicaid Other

## 2022-06-21 ENCOUNTER — Ambulatory Visit (INDEPENDENT_AMBULATORY_CARE_PROVIDER_SITE_OTHER): Payer: Medicaid Other | Admitting: Emergency Medicine

## 2022-06-21 VITALS — BP 111/69 | HR 76 | Ht 64.0 in | Wt 162.5 lb

## 2022-06-21 DIAGNOSIS — N898 Other specified noninflammatory disorders of vagina: Secondary | ICD-10-CM

## 2022-06-21 DIAGNOSIS — B3731 Acute candidiasis of vulva and vagina: Secondary | ICD-10-CM

## 2022-06-21 MED ORDER — TERCONAZOLE 0.8 % VA CREA: 1.0000 | TOPICAL_CREAM | Freq: Every day | VAGINAL | 0 refills | Status: DC

## 2022-06-21 NOTE — Progress Notes (Signed)
SUBJECTIVE:  26 y.o. female complains of white vaginal discharge for 1 week(s). Denies abnormal vaginal bleeding or significant pelvic pain or fever. No UTI symptoms. Denies history of known exposure to STD.  Patient's last menstrual period was 05/03/2022 (approximate).  OBJECTIVE:  She appears well, afebrile. Urine dipstick: not done.  ASSESSMENT:  Vaginal Discharge  Vaginal Odor   PLAN:  GC, chlamydia, trichomonas, BVAG, CVAG probe sent to lab. Treatment: To be determined once lab results are received ROV prn if symptoms persist or worsen.   Treated per protocol for yeast.

## 2022-06-22 ENCOUNTER — Other Ambulatory Visit (HOSPITAL_COMMUNITY)
Admission: RE | Admit: 2022-06-22 | Discharge: 2022-06-22 | Disposition: A | Discharge status: Home / Self Care | Payer: Medicaid Other | Source: Ambulatory Visit | Attending: Obstetrics and Gynecology | Admitting: Obstetrics and Gynecology

## 2022-06-22 ENCOUNTER — Telehealth: Payer: Self-pay

## 2022-06-22 ENCOUNTER — Ambulatory Visit: Payer: Medicaid Other

## 2022-06-22 DIAGNOSIS — B3731 Acute candidiasis of vulva and vagina: Secondary | ICD-10-CM | POA: Diagnosis not present

## 2022-06-22 DIAGNOSIS — N898 Other specified noninflammatory disorders of vagina: Secondary | ICD-10-CM | POA: Diagnosis not present

## 2022-06-22 NOTE — Telephone Encounter (Signed)
Called patient to inform of the need to repeat her self swab due to label discrepancy. Patient state that she will come in today at 1 pm to repeat self swab.

## 2022-06-22 NOTE — Addendum Note (Signed)
Addended by: Hamilton Capri on: 06/22/2022 01:27 PM   Modules accepted: Orders

## 2022-06-23 ENCOUNTER — Telehealth: Payer: Self-pay

## 2022-06-23 LAB — CERVICOVAGINAL ANCILLARY ONLY
Bacterial Vaginitis (gardnerella): NEGATIVE
Candida Glabrata: NEGATIVE
Candida Vaginitis: NEGATIVE
Chlamydia: NEGATIVE
Comment: NEGATIVE
Comment: NEGATIVE
Comment: NEGATIVE
Comment: NEGATIVE
Comment: NEGATIVE
Comment: NORMAL
Neisseria Gonorrhea: NEGATIVE
Trichomonas: NEGATIVE

## 2022-06-23 NOTE — Telephone Encounter (Signed)
S/w pt and advised that I spoke with pharmacy and rx for nausea is being filled.

## 2022-06-24 ENCOUNTER — Ambulatory Visit
Admission: RE | Admit: 2022-06-24 | Discharge: 2022-06-24 | Disposition: A | Discharge status: Home / Self Care | Payer: Medicaid Other | Source: Ambulatory Visit | Attending: Obstetrics and Gynecology | Admitting: Obstetrics and Gynecology

## 2022-06-24 DIAGNOSIS — Z3A01 Less than 8 weeks gestation of pregnancy: Secondary | ICD-10-CM | POA: Diagnosis not present

## 2022-06-24 DIAGNOSIS — Z3490 Encounter for supervision of normal pregnancy, unspecified, unspecified trimester: Secondary | ICD-10-CM | POA: Insufficient documentation

## 2022-06-24 DIAGNOSIS — Z3491 Encounter for supervision of normal pregnancy, unspecified, first trimester: Secondary | ICD-10-CM | POA: Diagnosis not present

## 2022-06-24 DIAGNOSIS — R110 Nausea: Secondary | ICD-10-CM | POA: Insufficient documentation

## 2022-06-24 DIAGNOSIS — O3680X0 Pregnancy with inconclusive fetal viability, not applicable or unspecified: Secondary | ICD-10-CM | POA: Diagnosis not present

## 2022-06-29 ENCOUNTER — Other Ambulatory Visit: Payer: Self-pay | Admitting: *Deleted

## 2022-06-29 NOTE — Patient Instructions (Signed)
Visit Information  Ms. Gradilla was given information about Medicaid Managed Care team care coordination services as a part of their Healthy [facility] Medicaid benefit. Jerlyn Ly Wert verbally consented to engagement with the [facility] Managed Care team.   If you are experiencing a medical emergency, please call 911 or report to your local emergency department or urgent care.   If you have a non-emergency medical problem during routine business hours, please contact your provider's office and ask to speak with a nurse.   For questions related to your Healthy [facility] health plan, please call: [phone] or visit the homepage here: [email or web address]  If you would like to schedule transportation through your Healthy [facility] plan, please call the following number at least 2 days in advance of your appointment: [phone]  For information about your ride after you set it up, call Ride Assist at [phone]. Use this number to activate a Will Call pickup, or if your transportation is late for a scheduled pickup. Use this number, too, if you need to make a change or cancel a previously scheduled reservation.  If you need transportation services right away, call [phone]. The after-hours call center is staffed 24 hours to handle ride assistance and urgent reservation requests (including discharges) 365 days a year. Urgent trips include sick visits, hospital discharge requests and life-sustaining treatment.  Call the [place] at [phone], at any time, 24 hours a day, 7 days a week. If you are in danger or need immediate medical attention call 911.  If you would like help to quit smoking, call 1-800-QUIT-NOW (678)035-5403) OR Espaol: 1-855-Djelo-Ya (7-867-672-0947) o para ms informacin haga clic aqu or Text READY to 200-400 to register via text  Ms. Curley,   Please see education materials related to Morning Sickness  provided by MyChart link.  Patient verbalizes understanding of instructions and care plan provided today and agrees to view in [place]. Active MyChart status and patient understanding of how to access instructions and care plan via MyChart confirmed with patient.     Telephone follow up appointment with Managed Medicaid care management team member scheduled for:08/03/22 @ 10:30am  Lurena Joiner RN, BSN [place] RN Care Coordinator   Following is a copy of your plan of care:  Care Plan : RN Care Manager Plan of Care  Updates made by [name], RN since 06/29/2022 12:00 AM     Problem: Health Management Needs related to Health Maintenance, Anxiety and Depression      Long-Range Goal: Development of Plan of Care to address Health Management Needs related to Health Maintenance, Anxiety and Depression   Start Date: 12/18/2021  Expected End Date: 08/24/2022  Priority: High  Note:   Current Barriers:  Knowledge Deficits related to plan of care for management of Anxiety with Panic Symptoms, and Depression: depressed mood decreased appetite and Health Maintenance -Ms. Hancock is now 8w 1d pregnant and is experiencing increased nausea. She has an upcoming appointment with OB/GYN on 08/04/22. She missed a follow up visit with [facility] and would like a referral to LCSW.     RNCM Clinical Goal(s):  Patient will verbalize understanding of plan for management of Anxiety, Depression, and Health Maintenance as evidenced by verbalization of self monitoring activities take all medications exactly as prescribed and will call provider for medication related questions as evidenced by documentation in EMR    work with social worker to address [place] Concerns  related to the management of  Anxiety and Depression as evidenced by review of EMR and patient or social worker report     through collaboration with Consulting civil engineer, provider, and care team. -Met Work with Care Guide  for food insecurity and needing a dental provider-Met  Interventions: Inter-disciplinary care team collaboration (see longitudinal plan of care) Evaluation of current treatment plan related to  self management and patient's adherence to plan as established by provider   Nausea and Vomiting:  (Status:  New goal.)  Long Term Goal Recommended adequate rest   Advised to follow routine screening guidelines as outlined by obstetrics provider   Advised patient to take all medications with her to upcoming OB visit on 08/04/22 Advised patient to have 3 small meals a day with small snack between meals, keep crackers by the bedside to have prior to sitting up in the morning Advised to keep hydrated, take small sips-not gulps, ice pops are a good option for fluids when feeling nauseated Discuss any concerns or questions with provider Keep all appointments including 08/04/22 with OB/GYN   Health Maintenance  (Status: Goal on Track (progressing): YES.) Long Term Goal  Evaluation of current treatment plan related to Anxiety, Depression, and Health Maintenance , Limited access to food self-management and patient's adherence to plan as established by provider. Discussed plans with patient for ongoing care management follow up and provided patient with direct contact information for care management team Provided education to patient re: family planning; Reviewed medications with patient and discussed the importance of taking all medications as directed; Provided patient with MyChart educational materials related to Health Maintenance; Referral to LCSW for mental health needs Advised patient to rescheduled missed visit with Behavioral Health  Patient Goals/Self-Care Activities: Take medications as prescribed   Attend church or other social activities Work with the social worker to address care coordination needs and will continue to work with the clinical team to address health care and disease management  related needs call 1-800-273-TALK (toll free, 24 hour hotline) go to [facility] [address] [place] [phone]) call 911 if experiencing a Mental Health or [place] new patient appointment with provider of choice

## 2022-06-29 NOTE — Patient Outreach (Signed)
Medicaid Managed Care   Nurse Care Manager Note  06/29/2022 Name:  Andrea Jenkins MRN:  [id number] DOB:  04/15/1996  Andrea Jenkins is an 26 y.o. year old female who is a primary patient of Patient, No Pcp Per.  The Medicaid Managed Care Coordination team was consulted for assistance with:    Obstetrics healthcare management needs Health Maintenance  Andrea Jenkins was given information about Medicaid Managed Care Coordination team services today. Andrea Jenkins Patient agreed to services and verbal consent obtained.  Engaged with patient by telephone for follow up visit in response to provider referral for case management and/or care coordination services.   Assessments/Interventions:  Review of past medical history, allergies, medications, health status, including review of consultants reports, laboratory and other test data, was performed as part of comprehensive evaluation and provision of chronic care management services.  SDOH (Social Determinants of Health) assessments and interventions performed: SDOH Interventions    Flowsheet Row Patient Outreach Telephone from 04/30/2022 in [facility] Care Coordination Most recent reading at 04/30/2022 11:21 AM Patient Outreach Telephone from 12/25/2021 in [facility] Care Coordination Most recent reading at 12/26/2021  1:38 PM Telephone from 12/18/2021 in [facility] Care Coordination Most recent reading at 12/18/2021  3:51 PM Patient Outreach Telephone from 12/18/2021 in [facility] Care Coordination Most recent reading at 12/18/2021  8:56 AM  SDOH Interventions      Food Insecurity Interventions -- NCCARE360 Referral, Assist with SNAP Application, Food Market ([facility] only), Other (Comment)  [Community Care Guide Assistance] NCCARE360 Referral Other (Comment)  [Care Guide referral for food pantries]  Housing Interventions -- Intervention Not  Indicated -- Intervention Not Indicated  Transportation Interventions Intervention Not Indicated Intervention Not Indicated -- Intervention Not Indicated  Depression Interventions/Treatment  -- Patient refuses Treatment  [Only requesting list of resources] -- --  Financial Strain Interventions -- Other (Comment)  [Care Guide Assistance] -- --  Physical Activity Interventions -- Patient Refused -- --  Stress Interventions -- Offered Community Wellness Resources, Other (Comment)  [Offered Counseling - Patient Refused] -- --  Social Connections Interventions -- Intervention Not Indicated -- --       Care Plan  Allergies  Allergen Reactions   Nortrel 1-35 (21) [Norethin-Eth Estrad Triphasic] Anaphylaxis   Peach [Prunus Persica] Hives and Itching    Medications Reviewed Today     Reviewed by ,  A, RN (Registered Nurse) on 06/29/22 at 1042  Med List Status: <None>   Medication Order Taking? Sig Documenting Provider Last Dose Status Informant  ARIPiprazole (ABILIFY) 5 MG tablet [id number] Yes Take 1 tablet (5 mg total) by mouth daily. Nwoko, Uchenna E, PA Taking Active   EPINEPHrine (EPIPEN 2-PAK) 0.3 mg/0.3 mL IJ SOAJ injection [id number] Yes Inject 0.3 mg into the muscle as needed for anaphylaxis. Amin, Ankit Chirag, MD Taking Active   famotidine (PEPCID) 20 MG tablet [id number]  Take 1 tablet (20 mg total) by mouth 2 (two) times daily for 5 days. Amin, Ankit Chirag, MD  Expired 03/11/22 2359   hydrOXYzine (ATARAX) 10 MG tablet [id number] No Take 1 tablet (10 mg total) by mouth 3 (three) times daily as needed.  Patient not taking: Reported on 06/29/2022   Nwoko, Uchenna E, PA Not Taking Active   loratadine (CLARITIN) 10 MG tablet [id number] No Take 1 tablet (10 mg total) by mouth daily.  Patient not taking: Reported on 03/23/2022   Harper, Charles A, MD Not   Taking Active Multiple Informants  Prenatal Vit-Fe Fumarate-FA (PREPLUS) 27-1 MG TABS [id number] Yes Take 1 tablet by mouth daily.  [name], CNM Taking Active   promethazine (PHENERGAN) 12.5 MG tablet [id number] Yes Take 1 tablet (12.5 mg total) by mouth every 6 (six) hours as needed for nausea or vomiting. [name], CNM Taking Active   pyridOXINE (VITAMIN B6) 25 MG tablet [id number] No Take 1 tablet (25 mg total) by mouth every 8 (eight) hours.  Patient not taking: Reported on 06/21/2022   [name], CNM Unknown Active   terconazole (TERAZOL 3) 0.8 % vaginal cream [id number] No Place 1 applicator vaginally at bedtime. Apply nightly for three nights.  Patient not taking: Reported on 06/29/2022   Constant, Peggy, MD Not Taking Active            Med Note ([name]   Tue Jun 29, 2022 10:41 AM) completed  traZODone (DESYREL) 50 MG tablet [id number] No Take 1 tablet (50 mg total) by mouth at bedtime.  Patient not taking: Reported on 06/02/2022   Malachy Mood, PA Not Taking Active            Med Note ([name]   Fri Apr 30, 2022 11:15 AM) Taking irregularly            Patient Active Problem List   Diagnosis Date Noted   Generalized anxiety disorder 03/26/2022   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   Bipolar disorder, current episode mixed, moderate ([place]) 03/26/2022   Fatigue 03/19/2022   Overweight (BMI 25.0-29.9) 03/19/2022   Depression    Menorrhagia    Allergic reaction to drug, initial encounter 03/06/2022   Acute cholecystitis 10/30/2021   Anxiety    Alpha thalassemia silent carrier 10/24/2019   LGSIL on Pap smear of cervix 10/20/2016    Conditions to be addressed/monitored per PCP order:   Health Maintenance and OB Health Management Needs  Care Plan : RN Care Manager Plan of Care  Updates made by [name], RN since 06/29/2022 12:00 AM     Problem: Health Management Needs related to Health Maintenance, Anxiety and Depression      Long-Range Goal: Development of Plan of Care to address Health Management Needs related to Health Maintenance,  Anxiety and Depression   Start Date: 12/18/2021  Expected End Date: 08/24/2022  Priority: High  Note:   Current Barriers:  Knowledge Deficits related to plan of care for management of Anxiety with Panic Symptoms, and Depression: depressed mood decreased appetite and Health Maintenance -Andrea Jenkins is now 8w 1d pregnant and is experiencing increased nausea. She has an upcoming appointment with OB/GYN on 08/04/22. She missed a follow up visit with [facility] and would like a referral to LCSW.     RNCM Clinical Goal(s):  Patient will verbalize understanding of plan for management of Anxiety, Depression, and Health Maintenance as evidenced by verbalization of self monitoring activities take all medications exactly as prescribed and will call provider for medication related questions as evidenced by documentation in EMR    work with social worker to address [place] Concerns  related to the management of Anxiety and Depression as evidenced by review of EMR and patient or social worker report     through collaboration with Consulting civil engineer, provider, and care team. -Met Work with Care Guide for food insecurity and needing a dental provider-Met  Interventions: Inter-disciplinary care team collaboration (see longitudinal plan of care) Evaluation of current  treatment plan related to  self management and patient's adherence to plan as established by provider   Nausea and Vomiting:  (Status:  New goal.)  Long Term Goal Recommended adequate rest   Advised to follow routine screening guidelines as outlined by obstetrics provider   Advised patient to take all medications with her to upcoming OB visit on 08/04/22 Advised patient to have 3 small meals a day with small snack between meals, keep crackers by the bedside to have prior to sitting up in the morning Advised to keep hydrated, take small sips-not gulps, ice pops are a good option for fluids when feeling nauseated Discuss any concerns or questions with  provider Keep all appointments including 08/04/22 with OB/GYN   Health Maintenance  (Status: Goal on Track (progressing): YES.) Long Term Goal  Evaluation of current treatment plan related to Anxiety, Depression, and Health Maintenance , Limited access to food self-management and patient's adherence to plan as established by provider. Discussed plans with patient for ongoing care management follow up and provided patient with direct contact information for care management team Provided education to patient re: family planning; Reviewed medications with patient and discussed the importance of taking all medications as directed; Provided patient with MyChart educational materials related to Health Maintenance; Referral to LCSW for mental health needs Advised patient to rescheduled missed visit with Behavioral Health  Patient Goals/Self-Care Activities: Take medications as prescribed   Attend church or other social activities Work with the Education officer, museum to address care coordination needs and will continue to work with the clinical team to address health care and disease management related needs call 1-800-273-TALK (toll free, 24 hour hotline) go to [facility] [address] [place] [phone]) call 911 if experiencing a Mental Health or [place] new patient appointment with provider of choice       Follow Up:  Patient agrees to Care Plan and Follow-up.  Plan: The Managed Medicaid care management team will reach out to the patient again over the next 30 days.  Date/time of next scheduled RN care management/care coordination outreach:  08/03/22 @ 10:30am  Lurena Joiner RN, BSN [place] RN Care Coordinator

## 2022-07-02 ENCOUNTER — Other Ambulatory Visit: Payer: Self-pay | Admitting: Licensed Clinical Social Worker

## 2022-07-02 DIAGNOSIS — F331 Major depressive disorder, recurrent, moderate: Secondary | ICD-10-CM

## 2022-07-02 DIAGNOSIS — F419 Anxiety disorder, unspecified: Secondary | ICD-10-CM

## 2022-07-02 DIAGNOSIS — F3162 Bipolar disorder, current episode mixed, moderate: Secondary | ICD-10-CM

## 2022-07-02 NOTE — Patient Outreach (Signed)
Medicaid Managed Care Social Work Note  07/02/2022 Name:  Andrea Jenkins MRN:  [id number] DOB:  Oct 17, 1996  Andrea Jenkins is an 26 y.o. year old female who is Jenkins primary patient of Patient, No Pcp Per.  The Medicaid Managed Care Coordination team was consulted for assistance with:  [place] and Resources  [name] was given information about Medicaid Managed Care Coordination team services today. [name] agreed to services and verbal consent obtained.  Engaged with patient  for by telephone forinitial visit in response to referral for case management and/or care coordination services.   Assessments/Interventions:  Review of past medical history, allergies, medications, health status, including review of consultants reports, laboratory and other test data, was performed as part of comprehensive evaluation and provision of chronic care management services.  SDOH: (Social Determinant of Health) assessments and interventions performed: SDOH Interventions    Flowsheet Row Patient Outreach Telephone from 07/02/2022 in [place] Most recent reading at 07/02/2022 10:29 AM Patient Outreach Telephone from 04/30/2022 in [place] Most recent reading at 04/30/2022 11:21 AM Patient Outreach Telephone from 12/25/2021 in [place] Most recent reading at 12/26/2021  1:38 PM Telephone from 12/18/2021 in [facility] [employer] [facility] Most recent reading at 12/18/2021  3:51 PM Patient Outreach Telephone from 12/18/2021 in [place] Most recent reading at 12/18/2021  8:56 AM  SDOH Interventions       Food Insecurity Interventions -- -- [id number] Referral, Assist with [employer], Information systems manager ([facility]), Other (Comment)  [place] Referral  Other (Comment)  [Care Guide referral for food pantries]  Housing Interventions -- -- Intervention Not Indicated -- Intervention Not Indicated  Transportation Interventions -- Intervention Not Indicated Intervention Not Indicated -- Intervention Not Indicated  Depression Interventions/Treatment  Medication -- Patient refuses Treatment  [Only requesting list of resources] -- --  Financial Strain Interventions -- -- Other (Comment)  [Care Guide Assistance] -- --  Physical Activity Interventions -- -- Patient Refused -- --  Stress Interventions Offered [employer], Provide Counseling -- Offered [employer], Other (Comment)  [Offered Counseling - Patient Refused] -- --  Social Connections Interventions -- -- Intervention Not Indicated -- --       Advanced Directives Status:  Not addressed in this encounter.  Care Plan                 Allergies  Allergen Reactions   Nortrel 1-35 (21) [Norethin-Eth Estrad Triphasic] Anaphylaxis   Peach [Prunus Persica] Hives and Itching    Medications Reviewed Today     Reviewed by Andrea Cutter, LCSW (Social Worker) on 07/02/22 at 1029  Med List Status: <None>   Medication Order Taking? Sig Documenting Provider Last Dose Status Informant  ARIPiprazole (ABILIFY) 5 MG tablet [id number] No Take 1 tablet (5 mg total) by mouth daily. Andrea Mood, PA Taking Active   EPINEPHrine (EPIPEN 2-PAK) 0.3 mg/0.3 mL IJ SOAJ injection [id number] No Inject 0.3 mg into the muscle as needed for anaphylaxis. Andrea Lack, MD Taking Active   famotidine (PEPCID) 20 MG tablet [id number]  Take 1 tablet (20 mg total) by mouth 2 (two) times daily for 5 days. Andrea Lack, MD  Expired 03/11/22 2359   hydrOXYzine (ATARAX) 10 MG tablet [id number] No Take 1 tablet (10 mg total) by mouth 3 (three) times  daily as needed.  Patient not taking: Reported on 06/29/2022   Andrea Hatchet, PA Not Taking Active   loratadine (CLARITIN) 10 MG tablet  [id number] No Take 1 tablet (10 mg total) by mouth daily.  Patient not taking: Reported on 03/23/2022   Andrea Bad, MD Not Taking Active Multiple Informants  Prenatal Vit-Fe Fumarate-FA (PREPLUS) 27-1 MG TABS [id number] No Take 1 tablet by mouth daily. Andrea Jenkins, CNM Taking Active   promethazine (PHENERGAN) 12.5 MG tablet [id number] No Take 1 tablet (12.5 mg total) by mouth every 6 (six) hours as needed for nausea or vomiting. Andrea Jenkins, CNM Taking Active   pyridOXINE (VITAMIN B6) 25 MG tablet [id number] No Take 1 tablet (25 mg total) by mouth every 8 (eight) hours.  Patient not taking: Reported on 06/21/2022   Andrea Jenkins, CNM Unknown Active   terconazole (TERAZOL 3) 0.8 % vaginal cream [id number] No Place 1 applicator vaginally at bedtime. Apply nightly for three nights.  Patient not taking: Reported on 06/29/2022   Jenkins, Peggy, MD Not Taking Active            Med Note (ROBB, Andrea Jenkins   Tue Jun 29, 2022 10:41 AM) completed  traZODone (DESYREL) 50 MG tablet [id number] No Take 1 tablet (50 mg total) by mouth at bedtime.  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   Bipolar disorder, current episode mixed, moderate (HCC) 03/26/2022   Fatigue 03/19/2022   Overweight (BMI 25.0-29.9) 03/19/2022   Depression    Menorrhagia    Allergic reaction to drug, initial encounter 03/06/2022   Acute cholecystitis 10/30/2021   Anxiety    Alpha thalassemia silent carrier 10/24/2019   LGSIL on Pap smear of cervix 10/20/2016    Conditions to be addressed/monitored per PCP order:  Anxiety and Depression  Care Plan : LCSW Plan of Care  Updates made by Gustavus Bryant, LCSW since 07/02/2022  12:00 AM     Problem: Anxiety Identification (Anxiety)      Goal: Anxiety Symptoms Identified   Start Date: 07/02/2022  Priority: High  Note:   Current Barriers:   Acute Mental Health Needs related to Anxiety and Financial Insecurities which requires Support, Education, Resources, Referrals, Advocacy, and Care Coordination, in order to meet Unmet Acute Mental Health Needs. Clinical Goal(s):  Patient will work with LCSW, to reduce and manage symptoms of Anxiety, Financial Insecurities, and Caregiver Stress, until well-controlled, or established with Jenkins community mental health provider.     Patient will increase knowledge and/or ability of:        Coping Skills, Healthy Habits, Self-Management Skills, Stress Reduction, Home Safety and Utilizing [employer] and Resources.   Interventions: Inter-disciplinary care team collaboration (see longitudinal plan of care). Clinical Interventions:  Mindfulness Meditation Strategies, Relaxation Techniques, and Deep Breathing Exercises reviewed, and encouraged daily. Referral made for [facility] BSW on 07/02/22 Referral made to [facility] for counseling on 07/02/22 Crisis resources provided and patient was encouraged to call [phone] if she experiences any SI/HI Solution-Focused Therapy performed. Emotional Support provided. Verbalization of Feelings encouraged. Problem Solving Solutions developed.  Cognitive Behavioral Therapy initiated. Patient Goals/Self-Care Activities: Work with [facility] B[facility], in an effort to obtain transportation and job seeking resources Incorporate into daily practice - relaxation techniques, deep breathing exercises, and mindfulness meditation strategies. Wait for call from [facility] or call yourself to set up initial counseling appointment Contact LCSW directly (# [phone]), if you have questions, need assistance, or if additional social work needs are identified in the near future.        24- Hour Availability:    [facility]  [address] [place], [place] [phone] Crisis [phone]   [facility] [phone]   [place] [facility]  [phone]    [facility]  [phone] (after hours)   Therapeutic Alternative/Mobile Crisis   [phone]   [place] National Suicide Hotline  [phone] [place]) [place] 841   Call 911 or go to emergency room   [facility]  [phone]);  [place] and [employer]  [phone]); [place], [place], [place], [place], [place], [place], [place]         The following coping skill education was provided for stress relief and mental health management: "When your car dies or Jenkins deadline looms, how do you respond? Long-term, low-grade or acute stress takes Jenkins serious toll on your body and mind, so don't ignore feelings of Jenkins tension. Stress is Jenkins natural part of life. However, too much stress can harm our health, especially if it continues every day. This is chronic stress and can put you at risk for heart problems like heart disease and depression. Understand what's happening inside your body and learn simple coping skills to combat the negative impacts of everyday stressors.  Types of Stress There are two types of stress: Emotional - types of emotional stress are relationship problems, pressure at work, financial worries, experiencing discrimination or having Jenkins major life change. Physical - Examples of physical stress include being sick having pain, not sleeping well, recovery from an injury or having an alcohol and drug use disorder. Fight or Flight Sudden or ongoing stress activates your nervous system and floods your bloodstream with adrenaline and cortisol, two hormones that raise blood pressure, increase heart rate and spike blood sugar. These changes pitch your body into Jenkins fight or flight response. That enabled our ancestors to outrun saber-toothed  tigers, and it's helpful today for situations like dodging Jenkins car accident. But most modern chronic stressors, such as finances or Jenkins challenging relationship, keep your body in that heightened state, which hurts your health. Effects of Too Much Stress If constantly under stress, most of [place] will eventually start to function less well.  Multiple studies link chronic stress to Jenkins higher risk of heart disease, stroke, depression, weight gain, memory loss and even premature death, so it's important to recognize the warning signals. Talk to your doctor about ways to manage stress if you're experiencing any of these symptoms: Prolonged periods of poor sleep. Regular, severe headaches. Unexplained weight loss or gain. Feelings of isolation, withdrawal or worthlessness. Jenkins anger and irritability. Loss of interest in activities. Jenkins worrying or obsessive thinking. Excessive alcohol or drug use. Inability to concentrate.  10 Ways to Cope with Chronic Stress It's key to recognize stressful situations as they occur because it allows you to focus on managing how you react. We all need to know when to close our eyes and take Jenkins deep breath when we feel tension rising. Use these tips to  prevent or reduce chronic stress. 1. Rebalance Work and Home All work and no play? If you're spending too much time at the office, intentionally put more dates in your calendar to enjoy time for fun, either alone or with others. 2. Get Regular Exercise Moving your body on Jenkins regular basis balances the nervous system and increases blood circulation, helping to flush out stress hormones. Even Jenkins daily 20-minute walk makes Jenkins difference. Any kind of exercise can lower stress and improve your Jenkins ? just pick activities that you enjoy and make it Jenkins regular habit. 3. Eat Well and Limit Alcohol and Stimulants Alcohol, nicotine and caffeine may temporarily relieve stress but have negative health impacts and can make stress worse  in the long run. Well-nourished bodies cope better, so start with Jenkins good breakfast, add more organic fruits and vegetables for Jenkins well-balanced diet, avoid processed foods and sugar, try herbal tea and drink more water. 4. Connect with Supportive People Talking face to face with another person releases hormones that reduce stress. Lean on those good listeners in your life. 5. [place] Time Do you enjoy gardening, reading, listening to music or some other creative pursuit? Engage in activities that bring you pleasure and joy; research shows that reduces stress by almost half and lowers your heart rate, too. 6. Practice Meditation, Stress Reduction or Yoga Relaxation techniques activate Jenkins state of restfulness that counterbalances your body's fight-or-flight hormones. Even if this also means Jenkins 10-minute break in Jenkins long day: listen to music, read, go for Jenkins walk in nature, do Jenkins hobby, take Jenkins bath or spend time with Jenkins friend. Also consider doing Jenkins mindfulness exercise or try Jenkins daily deep breathing or imagery practice. Deep Breathing Slow, calm and deep breathing can help you relax. Try these steps to focus on your breathing and repeat as needed. Find Jenkins comfortable position and close your eyes. Exhale and drop your shoulders. Breathe in through your nose; fill your lungs and then your belly. Think of relaxing your body, quieting your mind and becoming calm and peaceful. Breathe out slowly through your nose, relaxing your belly. Think of releasing tension, pain, worries or distress. Repeat steps three and four until you feel relaxed. Imagery This involves using your mind to excite the senses -- sound, vision, smell, taste and feeling. This may help ease your stress. Begin by getting comfortable and then do some slow breathing. Imagine Jenkins place you love being at. It could be somewhere from your childhood, somewhere you vacationed or just Jenkins place in your imagination. Feel how it is to be in the place  you're imagining. Pay attention to the sounds, air, colors, and who is there with you. This is Jenkins place where you feel cared for and loved. All is well. You are safe. Take in all the smells, sounds, tastes and feelings. As you do, feel your body being nourished and healed. Feel the calm that surrounds you. Breathe in all the good. Breathe out any discomfort or tension. 7. Sleep Enough If you get less than seven to eight hours of sleep, your body won't tolerate stress as well as it could. If stress keeps you up at night, address the cause, and add extra meditation into your day to make up for the lost z's. Try to get seven to nine hours of sleep each night. Make Jenkins regular bedtime schedule. Keep your room dark and cool. Try to avoid computers, TV, cell phones and tablets before bed. 8.  Bond with Connections You Enjoy Go out for Jenkins coffee with Jenkins friend, chat with Jenkins neighbor, call Jenkins family member, visit with Jenkins clergy member, or even hang out with your pet. Clinical studies show that spending even Jenkins short time with Jenkins companion animal can cut anxiety levels almost in half. 9. Take Jenkins Vacation Getting away from it all can reset your stress tolerance by increasing your mental and emotional outlook, which makes you Jenkins happier, more productive person upon return. Leave your cellphone and laptop at home! 10. See Jenkins Counselor, Coach or Therapist If negative thoughts overwhelm your ability to make positive changes, it's time to seek professional help. Make an appointment today--your health and life are worth it."       Follow up:  Patient agrees to Care Plan and Follow-up.  Plan: The Managed Medicaid care management team will reach out to the patient again over the next 30 days.  Date/time of next scheduled Social Work care management/care coordination outreach:  07/09/22 at 79 am  Eula Fried, BSW, MSW, [place] Medicaid LCSW [email or web address] Phone:  [phone]

## 2022-07-02 NOTE — Patient Instructions (Signed)
Visit Information  Andrea Jenkins was given information about Medicaid Managed Care team care coordination services as a part of their Healthy [facility] Medicaid benefit. Andrea Jenkins verbally consented to engagement with the [facility] Managed Care team.   If you are experiencing a medical emergency, please call 911 or report to your local emergency department or urgent care.   If you have a non-emergency medical problem during routine business hours, please contact your provider's office and ask to speak with a nurse.   For questions related to your Healthy [facility] health plan, please call: [phone] or visit the homepage here: [email or web address]  If you would like to schedule transportation through your Healthy [facility] plan, please call the following number at least 2 days in advance of your appointment: [phone]  For information about your ride after you set it up, call Ride Assist at [phone]. Use this number to activate a Will Call pickup, or if your transportation is late for a scheduled pickup. Use this number, too, if you need to make a change or cancel a previously scheduled reservation.  If you need transportation services right away, call [phone]. The after-hours call center is staffed 24 hours to handle ride assistance and urgent reservation requests (including discharges) 365 days a year. Urgent trips include sick visits, hospital discharge requests and life-sustaining treatment.  Call the [facility] Line at [phone], at any time, 24 hours a day, 7 days a week. If you are in danger or need immediate medical attention call 911.  If you would like help to quit smoking, call 1-800-QUIT-NOW (330-729-8465) OR Espaol: 1-855-Djelo-Ya (8-938-101-7510) o para ms informacin haga clic aqu or Text READY to 258-527 to register via text  Following is a copy of your plan of care:  Care Plan : LCSW Plan of Care   Updates made by Gustavus Bryant, LCSW since 07/02/2022 12:00 AM     Problem: Anxiety Identification (Anxiety)      Goal: Anxiety Symptoms Identified   Start Date: 07/02/2022  Priority: High  Note:   Current Barriers:   Acute Mental Health Needs related to Anxiety and Financial Insecurities which requires Support, Education, Resources, Referrals, Advocacy, and Care Coordination, in order to meet Unmet Acute Mental Health Needs. Clinical Goal(s):  Patient will work with LCSW, to reduce and manage symptoms of Anxiety, Financial Insecurities, and Caregiver Stress, until well-controlled, or established with a community mental health provider.     Patient will increase knowledge and/or ability of:        Coping Skills, Healthy Habits, Self-Management Skills, Stress Reduction, Home Safety and Utilizing [employer] and Resources.    Patient Goals/Self-Care Activities: Work with [facility] BSW, in an effort to obtain transportation and job seeking resources Incorporate into daily practice - relaxation techniques, deep breathing exercises, and mindfulness meditation strategies. Wait for call from [facility] or call yourself to set up initial counseling appointment Contact LCSW directly (# [phone]), if you have questions, need assistance, or if additional social work needs are identified in the near future.        24- Hour Availability:    [facility]  [address] [place], [place] [phone] Crisis [phone]   [facility] [phone]   [place] [facility]  [phone]    [facility]  [phone] (after hours)   Therapeutic Alternative/Mobile Crisis   [phone]   [place] National Suicide Hotline  (431)473-6138 (TALK) OR 7322350526  Call 911 or go to emergency room   [facility]  [phone]);  [place] and [employer]  [phone]); [address] [place], [place],  [place], [place], [place], [place], [place]         The following coping skill education was provided for stress relief and mental health management: "When your car dies or a deadline looms, how do you respond? Long-term, low-grade or acute stress takes a serious toll on your body and mind, so don't ignore feelings of constant tension. Stress is a natural part of life. However, too much stress can harm our health, especially if it continues every day. This is chronic stress and can put you at risk for heart problems like heart disease and depression. Understand what's happening inside your body and learn simple coping skills to combat the negative impacts of everyday stressors.  Types of Stress There are two types of stress: Emotional - types of emotional stress are relationship problems, pressure at work, financial worries, experiencing discrimination or having a major life change. Physical - Examples of physical stress include being sick having pain, not sleeping well, recovery from an injury or having an alcohol and drug use disorder. Fight or Flight Sudden or ongoing stress activates your nervous system and floods your bloodstream with adrenaline and cortisol, two hormones that raise blood pressure, increase heart rate and spike blood sugar. These changes pitch your body into a fight or flight response. That enabled our ancestors to outrun saber-toothed tigers, and it's helpful today for situations like dodging a car accident. But most modern chronic stressors, such as finances or a challenging relationship, keep your body in that heightened state, which hurts your health. Effects of Too Much Stress If constantly under stress, most of [place] will eventually start to function less well.  Multiple studies link chronic stress to a higher risk of heart disease, stroke, depression, weight gain, memory loss and even premature death, so it's important to recognize the warning signals. Talk to your doctor  about ways to manage stress if you're experiencing any of these symptoms: Prolonged periods of poor sleep. Regular, severe headaches. Unexplained weight loss or gain. Feelings of isolation, withdrawal or worthlessness. Constant anger and irritability. Loss of interest in activities. Constant worrying or obsessive thinking. Excessive alcohol or drug use. Inability to concentrate.  10 Ways to Cope with Chronic Stress It's key to recognize stressful situations as they occur because it allows you to focus on managing how you react. We all need to know when to close our eyes and take a deep breath when we feel tension rising. Use these tips to prevent or reduce chronic stress. 1. Rebalance Work and Home All work and no play? If you're spending too much time at the office, intentionally put more dates in your calendar to enjoy time for fun, either alone or with others. 2. Get Regular Exercise Moving your body on a regular basis balances the nervous system and increases blood circulation, helping to flush out stress hormones. Even a daily 20-minute walk makes a difference. Any kind of exercise can lower stress and improve your mood ? just pick activities that you enjoy and make it a regular habit. 3. Eat Well and Limit Alcohol and Stimulants Alcohol, nicotine and caffeine may temporarily relieve stress but have negative health impacts and can make stress worse in the long run. Well-nourished bodies cope better, so start with a good breakfast, add more organic fruits and vegetables for a well-balanced diet, avoid processed foods  and sugar, try herbal tea and drink more water. 4. Connect with Supportive People Talking face to face with another person releases hormones that reduce stress. Lean on those good listeners in your life. 5. Carve Out Hobby Time Do you enjoy gardening, reading, listening to music or some other creative pursuit? Engage in activities that bring you pleasure and joy; research  shows that reduces stress by almost half and lowers your heart rate, too. 6. Practice Meditation, Stress Reduction or Yoga Relaxation techniques activate a state of restfulness that counterbalances your body's fight-or-flight hormones. Even if this also means a 10-minute break in a long day: listen to music, read, go for a walk in nature, do a hobby, take a bath or spend time with a friend. Also consider doing a mindfulness exercise or try a daily deep breathing or imagery practice. Deep Breathing Slow, calm and deep breathing can help you relax. Try these steps to focus on your breathing and repeat as needed. Find a comfortable position and close your eyes. Exhale and drop your shoulders. Breathe in through your nose; fill your lungs and then your belly. Think of relaxing your body, quieting your mind and becoming calm and peaceful. Breathe out slowly through your nose, relaxing your belly. Think of releasing tension, pain, worries or distress. Repeat steps three and four until you feel relaxed. Imagery This involves using your mind to excite the senses -- sound, vision, smell, taste and feeling. This may help ease your stress. Begin by getting comfortable and then do some slow breathing. Imagine a place you love being at. It could be somewhere from your childhood, somewhere you vacationed or just a place in your imagination. Feel how it is to be in the place you're imagining. Pay attention to the sounds, air, colors, and who is there with you. This is a place where you feel cared for and loved. All is well. You are safe. Take in all the smells, sounds, tastes and feelings. As you do, feel your body being nourished and healed. Feel the calm that surrounds you. Breathe in all the good. Breathe out any discomfort or tension. 7. Sleep Enough If you get less than seven to eight hours of sleep, your body won't tolerate stress as well as it could. If stress keeps you up at night, address the cause, and  add extra meditation into your day to make up for the lost z's. Try to get seven to nine hours of sleep each night. Make a regular bedtime schedule. Keep your room dark and cool. Try to avoid computers, TV, cell phones and tablets before bed. 8. Bond with Connections You Enjoy Go out for a coffee with a friend, chat with a neighbor, call a family member, visit with a clergy member, or even hang out with your pet. Clinical studies show that spending even a short time with a companion animal can cut anxiety levels almost in half. 9. Take a Vacation Getting away from it all can reset your stress tolerance by increasing your mental and emotional outlook, which makes you a happier, more productive person upon return. Leave your cellphone and laptop at home! 10. See a Counselor, Coach or Therapist If negative thoughts overwhelm your ability to make positive changes, it's time to seek professional help. Make an appointment today--your health and life are worth it."

## 2022-07-06 ENCOUNTER — Other Ambulatory Visit: Payer: Self-pay

## 2022-07-06 ENCOUNTER — Ambulatory Visit: Payer: Medicaid Other

## 2022-07-06 NOTE — Patient Instructions (Signed)
Visit Information  [name] was given information about Medicaid Managed Care team care coordination services as a part of their Healthy [facility] Medicaid benefit. Andrea Jenkins verbally consented to engagement with the [facility] Managed Care team.   If you are experiencing a medical emergency, please call 911 or report to your local emergency department or urgent care.   If you have a non-emergency medical problem during routine business hours, please contact your provider's office and ask to speak with a nurse.   For questions related to your Healthy [facility] health plan, please call: [phone] or visit the homepage here: [email or web address]  If you would like to schedule transportation through your Healthy [facility] plan, please call the following number at least 2 days in advance of your appointment: [phone]  For information about your ride after you set it up, call Ride Assist at [phone]. Use this number to activate a Will Call pickup, or if your transportation is late for a scheduled pickup. Use this number, too, if you need to make a change or cancel a previously scheduled reservation.  If you need transportation services right away, call [phone]. The after-hours call center is staffed 24 hours to handle ride assistance and urgent reservation requests (including discharges) 365 days a year. Urgent trips include sick visits, hospital discharge requests and life-sustaining treatment.  Call the [place] at [phone], at any time, 24 hours a day, 7 days a week. If you are in danger or need immediate medical attention call 911.  If you would like help to quit smoking, call 1-800-QUIT-NOW (816)177-5349) OR Espaol: 1-855-Djelo-Ya (3-825-053-9767) o para ms informacin haga clic aqu or Text READY to 200-400 to register via text  Andrea Jenkins - following are the goals we discussed in your visit today:    Goals Addressed   None       Social Worker will follow up in 30 days .   Andrea Jenkins, BSW, [place]  High Risk Managed Medicaid Team  [phone]   Following is a copy of your plan of care:  Care Plan : [place] of Care  Updates made by Andrea Jenkins since 07/06/2022 12:00 AM     Problem: Health Management Needs related to Health Maintenance, Anxiety and Depression      Long-Range Goal: Development of Plan of Care to address Health Management Needs related to Health Maintenance, Anxiety and Depression   Start Date: 12/18/2021  Expected End Date: 08/24/2022  Priority: High  Note:   Current Barriers:  Knowledge Deficits related to plan of care for management of Anxiety with Panic Symptoms, and Depression: depressed mood decreased appetite and Health Maintenance -Andrea Jenkins is now 8w 1d pregnant and is experiencing increased nausea. She has an upcoming appointment with OB/GYN on 08/04/22. She missed a follow up visit with [facility] and would like a referral to LCSW.     RNCM Clinical Goal(s):  Patient will verbalize understanding of plan for management of Anxiety, Depression, and Health Maintenance as evidenced by verbalization of self monitoring activities take all medications exactly as prescribed and will call provider for medication related questions as evidenced by documentation in EMR    work with social worker to address [place] Concerns  related to the management of Anxiety and Depression as evidenced by review of EMR and patient or social worker report     through collaboration with Consulting civil engineer, provider, and care team. -Met  Work with [place] for food insecurity and needing a dental provider-Met  Interventions: Inter-disciplinary care team collaboration (see longitudinal plan of care) Evaluation of current treatment plan related to  self management and patient's adherence to plan as established by  provider 07/06/22: BSW completed a telephone outreach with patient. She stated she no longer needs transportation services. Patient states she only needs assistance with job seeking resources. BSW provided patient with information for [employer] located in [place]. Patient stated she is not working, but has to pay rent where she is living. BSW and patient discussed not being able to get assistance with rent due to her not being on the lease or paying the landlord. No other resources are needed.   Nausea and Vomiting:  (Status:  New goal.)  Long Term Goal Recommended adequate rest   Advised to follow routine screening guidelines as outlined by obstetrics provider   Advised patient to take all medications with her to upcoming OB visit on 08/04/22 Advised patient to have 3 small meals a day with small snack between meals, keep crackers by the bedside to have prior to sitting up in the morning Advised to keep hydrated, take small sips-not gulps, ice pops are a good option for fluids when feeling nauseated Discuss any concerns or questions with provider Keep all appointments including 08/04/22 with OB/GYN   Health Maintenance  (Status: Goal on Track (progressing): YES.) Long Term Goal  Evaluation of current treatment plan related to Anxiety, Depression, and Health Maintenance , Limited access to food self-management and patient's adherence to plan as established by provider. Discussed plans with patient for ongoing care management follow up and provided patient with direct contact information for care management team Provided education to patient re: family planning; Reviewed medications with patient and discussed the importance of taking all medications as directed; Provided patient with MyChart educational materials related to Health Maintenance; Referral to LCSW for mental health needs Advised patient to rescheduled missed visit with Behavioral Health  Patient Goals/Self-Care  Activities: Take medications as prescribed   Attend church or other social activities Work with the social worker to address care coordination needs and will continue to work with the clinical team to address health care and disease management related needs call 1-800-273-TALK (toll free, 24 hour hotline) go to [facility] [address] [place] [phone]) call 911 if experiencing a Mental Health or [place] new patient appointment with provider of choice

## 2022-07-06 NOTE — Patient Outreach (Signed)
Medicaid Managed Care Social Work Note  07/06/2022 Name:  Andrea Jenkins MRN:  [id number] DOB:  11-Dec-1995  Andrea Jenkins is an 26 y.o. year old female who is a primary Andrea Jenkins of Andrea Jenkins, No Pcp Per.  The Medicaid Managed Care Coordination team was consulted for assistance with:  Community Resources   Andrea Jenkins was given information about Medicaid Managed Care Coordination team services today. [place] Andrea Jenkins agreed to services and verbal consent obtained.  Engaged with Andrea Jenkins  for by telephone forinitial visit in response to referral for case management and/or care coordination services.   Assessments/Interventions:  Review of past medical history, allergies, medications, health status, including review of consultants reports, laboratory and other test data, was performed as part of comprehensive evaluation and provision of chronic care management services.  SDOH: (Social Determinant of Health) assessments and interventions performed: SDOH Interventions    Flowsheet Row Andrea Jenkins Outreach Telephone from 07/02/2022 in [facility] [place] Most recent reading at 07/02/2022 10:29 AM Andrea Jenkins Outreach Telephone from 04/30/2022 in [place] Most recent reading at 04/30/2022 11:21 AM Andrea Jenkins Outreach Telephone from 12/25/2021 in [place] Most recent reading at 12/26/2021  1:38 PM Telephone from 12/18/2021 in [facility] [employer] [facility] Most recent reading at 12/18/2021  3:51 PM Andrea Jenkins Outreach Telephone from 12/18/2021 in [facility] Most recent reading at 12/18/2021  8:56 AM  SDOH Interventions       Food Insecurity Interventions -- -- [id number] Referral, Assist with [employer], Information systems manager ([facility] only), Other (Comment)  [place] Referral Other (Comment)   [Care Guide referral for food pantries]  Housing Interventions -- -- Intervention Not Indicated -- Intervention Not Indicated  Transportation Interventions -- Intervention Not Indicated Intervention Not Indicated -- Intervention Not Indicated  Depression Interventions/Treatment  Medication -- Andrea Jenkins refuses Treatment  [Only requesting list of resources] -- --  Financial Strain Interventions -- -- Other (Comment)  [Care Guide Assistance] -- --  Physical Activity Interventions -- -- Andrea Jenkins Refused -- --  Stress Interventions Offered [employer], Provide Counseling -- , Other (Comment)  [Offered Counseling - Andrea Jenkins Refused] -- --  Social Connections Interventions -- -- Intervention Not Indicated -- --     07/06/22: BSW completed a telephone outreach with Andrea Jenkins. She stated she no longer needs transportation services. Andrea Jenkins states she only needs assistance with job seeking resources. BSW provided Andrea Jenkins with information for [facility] located in [place]. Andrea Jenkins stated she is not working, but has to pay rent where she is living. BSW and Andrea Jenkins discussed not being able to get assistance with rent due to her not being on the lease or paying the landlord. No other resources are needed.   Advanced Directives Status:  Not addressed in this encounter.  Care Plan                 Allergies  Allergen Reactions   Nortrel 1-35 (21) [Norethin-Eth Estrad Triphasic] Anaphylaxis   Peach [Prunus Persica] Hives and Itching    Medications Reviewed Today     Reviewed by Greg Cutter, LCSW (Social Worker) on 07/02/22 at 1029  Med List Status: <None>   Medication Order Taking? Sig Documenting Provider Last Dose Status Informant  ARIPiprazole (ABILIFY) 5 MG tablet [id number] No Take 1 tablet (5 mg total) by mouth daily. Malachy Mood, PA Taking Active  EPINEPHrine (EPIPEN 2-PAK) 0.3 mg/0.3 mL IJ SOAJ injection [id number] No Inject 0.3 mg  into the muscle as needed for anaphylaxis. [name], MD Taking Active   famotidine (PEPCID) 20 MG tablet [id number]  Take 1 tablet (20 mg total) by mouth 2 (two) times daily for 5 days. [name], MD  Expired 03/11/22 2359   hydrOXYzine (ATARAX) 10 MG tablet [id number] No Take 1 tablet (10 mg total) by mouth 3 (three) times daily as needed.  Andrea Jenkins not taking: Reported on 06/29/2022   Malachy Mood, PA Not Taking Active   loratadine (CLARITIN) 10 MG tablet [id number] No Take 1 tablet (10 mg total) by mouth daily.  Andrea Jenkins not taking: Reported on 03/23/2022   Shelly Bombard, MD Not Taking Active Multiple Informants  Prenatal Vit-Fe Fumarate-FA (PREPLUS) 27-1 MG TABS [id number] No Take 1 tablet by mouth daily. [name], CNM Taking Active   promethazine (PHENERGAN) 12.5 MG tablet [id number] No Take 1 tablet (12.5 mg total) by mouth every 6 (six) hours as needed for nausea or vomiting. [name], CNM Taking Active   pyridOXINE (VITAMIN B6) 25 MG tablet [id number] No Take 1 tablet (25 mg total) by mouth every 8 (eight) hours.  Andrea Jenkins not taking: Reported on 06/21/2022   [name], CNM Unknown Active   terconazole (TERAZOL 3) 0.8 % vaginal cream [id number] No Place 1 applicator vaginally at bedtime. Apply nightly for three nights.  Andrea Jenkins not taking: Reported on 06/29/2022   Constant, Peggy, MD Not Taking Active            Med Note (ROBB, MELANIE A   Tue Jun 29, 2022 10:41 AM) completed  traZODone (DESYREL) 50 MG tablet [id number] No Take 1 tablet (50 mg total) by mouth at bedtime.  Andrea Jenkins not taking: Reported on 06/02/2022   Malachy Mood, PA Not Taking Active            Med Note (ROBB, MELANIE A   Fri Apr 30, 2022 11:15 AM) Taking irregularly            Andrea Jenkins Active Problem List   Diagnosis Date Noted   Generalized anxiety disorder 03/26/2022   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   Bipolar disorder, current episode mixed,  moderate ([place]) 03/26/2022   Fatigue 03/19/2022   Overweight (BMI 25.0-29.9) 03/19/2022   Depression    Menorrhagia    Allergic reaction to drug, initial encounter 03/06/2022   Acute cholecystitis 10/30/2021   Anxiety    Alpha thalassemia silent carrier 10/24/2019   LGSIL on Pap smear of cervix 10/20/2016    Conditions to be addressed/monitored per PCP order:   community resources  Care Plan : [place] of Care  Updates made by Ethelda Chick since 07/06/2022 12:00 AM     Problem: Health Management Needs related to Health Maintenance, Anxiety and Depression      Long-Range Goal: Development of Plan of Care to address Health Management Needs related to Health Maintenance, Anxiety and Depression   Start Date: 12/18/2021  Expected End Date: 08/24/2022  Priority: High  Note:   Current Barriers:  Knowledge Deficits related to plan of care for management of Anxiety with Panic Symptoms, and Depression: depressed mood decreased appetite and Health Maintenance -Ms. Larner is now 8w 1d pregnant and is experiencing increased nausea. She has an upcoming appointment with OB/GYN on 08/04/22. She missed a follow up visit with [facility] and would like a referral to LCSW.  RNCM Clinical Goal(s):  Andrea Jenkins will verbalize understanding of plan for management of Anxiety, Depression, and Health Maintenance as evidenced by verbalization of self monitoring activities take all medications exactly as prescribed and will call provider for medication related questions as evidenced by documentation in EMR    work with social worker to address [place] Concerns  related to the management of Anxiety and Depression as evidenced by review of EMR and Andrea Jenkins or social worker report     through collaboration with Consulting civil engineer, provider, and care team. -Met Work with Care Guide for food insecurity and needing a dental provider-Met  Interventions: Inter-disciplinary care team collaboration (see  longitudinal plan of care) Evaluation of current treatment plan related to  self management and Andrea Jenkins's adherence to plan as established by provider 07/06/22: BSW completed a telephone outreach with Andrea Jenkins. She stated she no longer needs transportation services. Andrea Jenkins states she only needs assistance with job seeking resources. BSW provided Andrea Jenkins with information for [facility] located in [place]. Andrea Jenkins stated she is not working, but has to pay rent where she is living. BSW and Andrea Jenkins discussed not being able to get assistance with rent due to her not being on the lease or paying the landlord. No other resources are needed.   Nausea and Vomiting:  (Status:  New goal.)  Long Term Goal Recommended adequate rest   Advised to follow routine screening guidelines as outlined by obstetrics provider   Advised Andrea Jenkins to take all medications with her to upcoming OB visit on 08/04/22 Advised Andrea Jenkins to have 3 small meals a day with small snack between meals, keep crackers by the bedside to have prior to sitting up in the morning Advised to keep hydrated, take small sips-not gulps, ice pops are a good option for fluids when feeling nauseated Discuss any concerns or questions with provider Keep all appointments including 08/04/22 with OB/GYN   Health Maintenance  (Status: Goal on Track (progressing): YES.) Long Term Goal  Evaluation of current treatment plan related to Anxiety, Depression, and Health Maintenance , Limited access to food self-management and Andrea Jenkins's adherence to plan as established by provider. Discussed plans with Andrea Jenkins for ongoing care management follow up and provided Andrea Jenkins with direct contact information for care management team Provided education to Andrea Jenkins re: family planning; Reviewed medications with Andrea Jenkins and discussed the importance of taking all medications as directed; Provided Andrea Jenkins with MyChart educational materials related to Health  Maintenance; Referral to LCSW for mental health needs Advised Andrea Jenkins to rescheduled missed visit with Behavioral Health  Andrea Jenkins Goals/Self-Care Activities: Take medications as prescribed   Attend church or other social activities Work with the Education officer, museum to address care coordination needs and will continue to work with the clinical team to address health care and disease management related needs call 1-800-273-TALK (toll free, 24 hour hotline) go to [facility] [address] [place] [phone]) call 911 if experiencing a Mental Health or [place] new Andrea Jenkins appointment with provider of choice       Follow up:  Andrea Jenkins agrees to Care Plan and Follow-up.  Plan: The Managed Medicaid care management team will reach out to the Andrea Jenkins again over the next 30 days.  Date/time of next scheduled Social Work care management/care coordination outreach:  08/05/22  Mickel Fuchs, Arita Miss, [place] Managed Medicaid Team  [phone]

## 2022-07-09 ENCOUNTER — Other Ambulatory Visit: Payer: Self-pay | Admitting: Licensed Clinical Social Worker

## 2022-07-09 ENCOUNTER — Ambulatory Visit: Payer: Medicaid Other

## 2022-07-09 NOTE — Patient Instructions (Signed)
Visit Information  Ms. Gurganus was given information about Medicaid Managed Care team care coordination services as a part of their Healthy [facility] Medicaid benefit. Kathryne Hitch [place] verbally consented to engagement with the [facility] Managed Care team.   If you are experiencing a medical emergency, please call 911 or report to your local emergency department or urgent care.   If you have a non-emergency medical problem during routine business hours, please contact your provider's office and ask to speak with a nurse.   For questions related to your Healthy [facility] health plan, please call: [phone] or visit the homepage here: [email or web address]  If you would like to schedule transportation through your Healthy [facility] plan, please call the following number at least 2 days in advance of your appointment: [phone]  For information about your ride after you set it up, call Ride Assist at [phone]. Use this number to activate a Will Call pickup, or if your transportation is late for a scheduled pickup. Use this number, too, if you need to make a change or cancel a previously scheduled reservation.  If you need transportation services right away, call [phone]. The after-hours call center is staffed 24 hours to handle ride assistance and urgent reservation requests (including discharges) 365 days a year. Urgent trips include sick visits, hospital discharge requests and life-sustaining treatment.  Call the [facility] Line at [phone], at any time, 24 hours a day, 7 days a week. If you are in danger or need immediate medical attention call 911.  If you would like help to quit smoking, call 1-800-QUIT-NOW (775-273-0302) OR Espaol: 1-855-Djelo-Ya (6-761-950-9326) o para ms informacin haga clic aqu or Text READY to 712-458 to register via text   Following is a copy of your plan of care:  Care Plan : LCSW Plan of Care   Updates made by Gustavus Bryant, LCSW since 07/09/2022 12:00 AM     Problem: Anxiety Identification (Anxiety)      Goal: Anxiety Symptoms Identified   Start Date: 07/02/2022  Priority: High  Note:   Current Barriers:   Acute Mental Health Needs related to Anxiety and Financial Insecurities which requires Support, Education, Resources, Referrals, Advocacy, and Care Coordination, in order to meet Unmet Acute Mental Health Needs. Clinical Goal(s):  Patient will work with LCSW, to reduce and manage symptoms of Anxiety, Financial Insecurities, and Caregiver Stress, until well-controlled, or established with a community mental health provider.     Patient will increase knowledge and/or ability of:        Coping Skills, Healthy Habits, Self-Management Skills, Stress Reduction, Home Safety and Utilizing [employer] and Resources.   Interventions: Inter-disciplinary care team collaboration (see longitudinal plan of care).     24- Hour Availability:    [facility]  [address] [place], [place] [phone] Crisis [phone]   [facility] [phone]   [place] [facility]  [phone]    [facility] ([place]) [facility]  [phone] (after hours)   Therapeutic Alternative/Mobile Crisis   [phone]   [place] National Suicide Hotline  364-604-5397 [place]) [place] 497   Call 911 or go to emergency room   [facility]  [phone]);  [place] and [employer]  [phone]); [place], [place], [place], [place], [place], [place], [place]         The following coping skill education was provided for stress relief and mental health management: "When your car dies or a  deadline looms, how do you respond? Long-term, low-grade or acute stress takes a serious toll on your body and mind, so don't ignore feelings of constant tension. Stress is a natural part of life. However, too much  stress can harm our health, especially if it continues every day. This is chronic stress and can put you at risk for heart problems like heart disease and depression. Understand what's happening inside your body and learn simple coping skills to combat the negative impacts of everyday stressors.  Types of Stress There are two types of stress: Emotional - types of emotional stress are relationship problems, pressure at work, financial worries, experiencing discrimination or having a major life change. Physical - Examples of physical stress include being sick having pain, not sleeping well, recovery from an injury or having an alcohol and drug use disorder. Fight or Flight Sudden or ongoing stress activates your nervous system and floods your bloodstream with adrenaline and cortisol, two hormones that raise blood pressure, increase heart rate and spike blood sugar. These changes pitch your body into a fight or flight response. That enabled our ancestors to outrun saber-toothed tigers, and it's helpful today for situations like dodging a car accident. But most modern chronic stressors, such as finances or a challenging relationship, keep your body in that heightened state, which hurts your health. Effects of Too Much Stress If constantly under stress, most of [place] will eventually start to function less well.  Multiple studies link chronic stress to a higher risk of heart disease, stroke, depression, weight gain, memory loss and even premature death, so it's important to recognize the warning signals. Talk to your doctor about ways to manage stress if you're experiencing any of these symptoms: Prolonged periods of poor sleep. Regular, severe headaches. Unexplained weight loss or gain. Feelings of isolation, withdrawal or worthlessness. Constant anger and irritability. Loss of interest in activities. Constant worrying or obsessive thinking. Excessive alcohol or drug use. Inability to concentrate.  10  Ways to Cope with Chronic Stress It's key to recognize stressful situations as they occur because it allows you to focus on managing how you react. We all need to know when to close our eyes and take a deep breath when we feel tension rising. Use these tips to prevent or reduce chronic stress. 1. Rebalance Work and Home All work and no play? If you're spending too much time at the office, intentionally put more dates in your calendar to enjoy time for fun, either alone or with others. 2. Get Regular Exercise Moving your body on a regular basis balances the nervous system and increases blood circulation, helping to flush out stress hormones. Even a daily 20-minute walk makes a difference. Any kind of exercise can lower stress and improve your mood ? just pick activities that you enjoy and make it a regular habit. 3. Eat Well and Limit Alcohol and Stimulants Alcohol, nicotine and caffeine may temporarily relieve stress but have negative health impacts and can make stress worse in the long run. Well-nourished bodies cope better, so start with a good breakfast, add more organic fruits and vegetables for a well-balanced diet, avoid processed foods and sugar, try herbal tea and drink more water. 4. Connect with Supportive People Talking face to face with another person releases hormones that reduce stress. Lean on those good listeners in your life. 5. Carve Out Hobby Time Do you enjoy gardening, reading, listening to music or some other creative pursuit? Engage in activities that bring you pleasure and  joy; research shows that reduces stress by almost half and lowers your heart rate, too. 6. Practice Meditation, Stress Reduction or Yoga Relaxation techniques activate a state of restfulness that counterbalances your body's fight-or-flight hormones. Even if this also means a 10-minute break in a long day: listen to music, read, go for a walk in nature, do a hobby, take a bath or spend time with a friend. Also  consider doing a mindfulness exercise or try a daily deep breathing or imagery practice. Deep Breathing Slow, calm and deep breathing can help you relax. Try these steps to focus on your breathing and repeat as needed. Find a comfortable position and close your eyes. Exhale and drop your shoulders. Breathe in through your nose; fill your lungs and then your belly. Think of relaxing your body, quieting your mind and becoming calm and peaceful. Breathe out slowly through your nose, relaxing your belly. Think of releasing tension, pain, worries or distress. Repeat steps three and four until you feel relaxed. Imagery This involves using your mind to excite the senses -- sound, vision, smell, taste and feeling. This may help ease your stress. Begin by getting comfortable and then do some slow breathing. Imagine a place you love being at. It could be somewhere from your childhood, somewhere you vacationed or just a place in your imagination. Feel how it is to be in the place you're imagining. Pay attention to the sounds, air, colors, and who is there with you. This is a place where you feel cared for and loved. All is well. You are safe. Take in all the smells, sounds, tastes and feelings. As you do, feel your body being nourished and healed. Feel the calm that surrounds you. Breathe in all the good. Breathe out any discomfort or tension. 7. Sleep Enough If you get less than seven to eight hours of sleep, your body won't tolerate stress as well as it could. If stress keeps you up at night, address the cause, and add extra meditation into your day to make up for the lost z's. Try to get seven to nine hours of sleep each night. Make a regular bedtime schedule. Keep your room dark and cool. Try to avoid computers, TV, cell phones and tablets before bed. 8. Bond with Connections You Enjoy Go out for a coffee with a friend, chat with a neighbor, call a family member, visit with a clergy member, or even hang  out with your pet. Clinical studies show that spending even a short time with a companion animal can cut anxiety levels almost in half. 9. Take a Vacation Getting away from it all can reset your stress tolerance by increasing your mental and emotional outlook, which makes you a happier, more productive person upon return. Leave your cellphone and laptop at home! 10. See a Counselor, Coach or Therapist If negative thoughts overwhelm your ability to make positive changes, it's time to seek professional help. Make an appointment today--your health and life are worth it."

## 2022-07-09 NOTE — Patient Outreach (Signed)
Medicaid Managed Care Social Work Note  07/09/2022 Name:  Andrea Jenkins MRN:  [id number] DOB:  05-23-1996  Andrea Jenkins is an 26 y.o. year old female who is a primary patient of Patient, No Pcp Per.  The Medicaid Managed Care Coordination team was consulted for assistance with:  Mental Health Counseling and Resources  Ms. Hollinger was given information about Medicaid Managed Care Coordination team services today. Andrea Jenkins Patient agreed to services and verbal consent obtained.  Engaged with patient  for by telephone forfollow up visit in response to referral for case management and/or care coordination services.   Assessments/Interventions:  Review of past medical history, allergies, medications, health status, including review of consultants reports, laboratory and other test data, was performed as part of comprehensive evaluation and provision of chronic care management services.  SDOH: (Social Determinant of Health) assessments and interventions performed: SDOH Interventions    Flowsheet Row Patient Outreach Telephone from 07/02/2022 in [facility] Care Coordination Most recent reading at 07/02/2022 10:29 AM Patient Outreach Telephone from 04/30/2022 in [facility] [employer] Most recent reading at 04/30/2022 11:21 AM Patient Outreach Telephone from 12/25/2021 in [facility] [employer] Most recent reading at 12/26/2021  1:38 PM Telephone from 12/18/2021 in [employer] Most recent reading at 12/18/2021  3:51 PM Patient Outreach Telephone from 12/18/2021 in [facility] [employer] Most recent reading at 12/18/2021  8:56 AM  SDOH Interventions       Food Insecurity Interventions -- -- [id number] Referral, Assist with [employer], Stage manager ([facility]), Other (Comment)  [Community Care Guide Assistance] [id number] Referral  Other (Comment)  [Care Guide referral for food pantries]  Housing Interventions -- -- Intervention Not Indicated -- Intervention Not Indicated  Transportation Interventions -- Intervention Not Indicated Intervention Not Indicated -- Intervention Not Indicated  Depression Interventions/Treatment  Medication -- Patient refuses Treatment  [Only requesting list of resources] -- --  Financial Strain Interventions -- -- Other (Comment)  [Care Guide Assistance] -- --  Physical Activity Interventions -- -- Patient Refused -- --  Stress Interventions Offered [employer], Provide Counseling -- Offered [employer], Other (Comment)  [Offered Counseling - Patient Refused] -- --  Social Connections Interventions -- -- Intervention Not Indicated -- --       Advanced Directives Status:  See Care Plan for related entries.  Care Plan                 Allergies  Allergen Reactions   Nortrel 1-35 (21) [Norethin-Eth Estrad Triphasic] Anaphylaxis   Peach [Prunus Persica] Hives and Itching    Medications Reviewed Today     Reviewed by Gustavus Bryant, LCSW (Social Worker) on 07/02/22 at 1029  Med List Status: <None>   Medication Order Taking? Sig Documenting Provider Last Dose Status Informant  ARIPiprazole (ABILIFY) 5 MG tablet [id number] No Take 1 tablet (5 mg total) by mouth daily. Meta Hatchet, PA Taking Active   EPINEPHrine (EPIPEN 2-PAK) 0.3 mg/0.3 mL IJ SOAJ injection [id number] No Inject 0.3 mg into the muscle as needed for anaphylaxis. Dimple Nanas, MD Taking Active   famotidine (PEPCID) 20 MG tablet [id number]  Take 1 tablet (20 mg total) by mouth 2 (two) times daily for 5 days. Dimple Nanas, MD  Expired 03/11/22 2359   hydrOXYzine (ATARAX) 10 MG tablet [id number] No Take 1 tablet (10 mg total) by mouth 3 (  three) times daily as needed.  Patient not taking: Reported on 06/29/2022   Malachy Mood, PA Not Taking Active   loratadine (CLARITIN) 10 MG  tablet [id number] No Take 1 tablet (10 mg total) by mouth daily.  Patient not taking: Reported on 03/23/2022   Shelly Bombard, MD Not Taking Active Multiple Informants  Prenatal Vit-Fe Fumarate-FA (PREPLUS) 27-1 MG TABS [id number] No Take 1 tablet by mouth daily. [name], CNM Taking Active   promethazine (PHENERGAN) 12.5 MG tablet [id number] No Take 1 tablet (12.5 mg total) by mouth every 6 (six) hours as needed for nausea or vomiting. [name], CNM Taking Active   pyridOXINE (VITAMIN B6) 25 MG tablet [id number] No Take 1 tablet (25 mg total) by mouth every 8 (eight) hours.  Patient not taking: Reported on 06/21/2022   [name], CNM Unknown Active   terconazole (TERAZOL 3) 0.8 % vaginal cream [id number] No Place 1 applicator vaginally at bedtime. Apply nightly for three nights.  Patient not taking: Reported on 06/29/2022   Constant, Peggy, MD Not Taking Active            Med Note (ROBB, MELANIE A   Tue Jun 29, 2022 10:41 AM) completed  traZODone (DESYREL) 50 MG tablet [id number] No Take 1 tablet (50 mg total) by mouth at bedtime.  Patient not taking: Reported on 06/02/2022   Malachy Mood, PA Not Taking Active            Med Note (ROBB, MELANIE A   Fri Apr 30, 2022 11:15 AM) Taking irregularly            Patient Active Problem List   Diagnosis Date Noted   Generalized anxiety disorder 03/26/2022   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   Bipolar disorder, current episode mixed, moderate ([place]) 03/26/2022   Fatigue 03/19/2022   Overweight (BMI 25.0-29.9) 03/19/2022   Depression    Menorrhagia    Allergic reaction to drug, initial encounter 03/06/2022   Acute cholecystitis 10/30/2021   Anxiety    Alpha thalassemia silent carrier 10/24/2019   LGSIL on Pap smear of cervix 10/20/2016    Conditions to be addressed/monitored per PCP order:  [place] : LCSW Plan of Care  Updates made by Greg Cutter, LCSW since 07/09/2022 12:00 AM      Problem: Anxiety Identification (Anxiety)      Goal: Anxiety Symptoms Identified   Start Date: 07/02/2022  Priority: High  Note:   Current Barriers:   Acute Mental Health Needs related to Anxiety and Financial Insecurities which requires Support, Education, Resources, Referrals, Advocacy, and Care Coordination, in order to meet Unmet Acute Mental Health Needs. Clinical Goal(s):  Patient will work with LCSW, to reduce and manage symptoms of Anxiety, Financial Insecurities, and Caregiver Stress, until well-controlled, or established with a community mental health provider.     Patient will increase knowledge and/or ability of:        Coping Skills, Healthy Habits, Self-Management Skills, Stress Reduction, Home Safety and Utilizing [employer] and Resources.   Interventions: Inter-disciplinary care team collaboration (see longitudinal plan of care). Clinical Interventions:  Mindfulness Meditation Strategies, Relaxation Techniques, and Deep Breathing Exercises reviewed, and encouraged daily. Referral made for [facility] B[facility] on 07/02/22 Referral made to [facility] for counseling on 07/02/22 Crisis resources provided and patient was encouraged to call [phone] if she experiences any SI/HI Solution-Focused Therapy performed. Emotional Support provided. Verbalization of Feelings encouraged. Problem Solving Solutions developed.  Cognitive Behavioral Therapy initiated. Joint call to [facility] today to schedule counseling appointment. Message was left.  Email sent to patient with resource information and [facility] walk in hours Patient Goals/Self-Care Activities: Work with [place], in an effort to obtain transportation and job seeking resources Incorporate into daily practice - relaxation techniques, deep breathing exercises, and mindfulness meditation strategies. Wait for call from [facility] or call yourself to set up initial counseling appointment Contact LCSW directly (# [phone]), if you have questions, need  assistance, or if additional social work needs are identified in the near future.        24- Hour Availability:    [facility]  [address] [place], [place] Crisis [phone]   [facility] [employer] [place]  [phone]    [place]  [phone] (after hours)   Therapeutic Alternative/Mobile Crisis   [phone]   [place] National Suicide Hotline  [phone] 057-4113 [place]) [place] 988   Call 911 or go to emergency room   [facility]  [phone]);  [place] and [employer]  [phone]); [place], [place], [place], [place], [place], [place], [place]         The following coping skill education was provided for stress relief and mental health management: "When your car dies or a deadline looms, how do you respond? Long-term, low-grade or acute stress takes a serious toll on your body and mind, so don't ignore feelings of constant tension. Stress is a natural part of life. However, too much stress can harm our health, especially if it continues every day. This is chronic stress and can put you at risk for heart problems like heart disease and depression. Understand what's happening inside your body and learn simple coping skills to combat the negative impacts of everyday stressors.  Types of Stress There are two types of stress: Emotional - types of emotional stress are relationship problems, pressure at work, financial worries, experiencing discrimination or having a major life change. Physical - Examples of physical stress include being sick having pain, not sleeping well, recovery from an injury or having an alcohol and drug use disorder. Fight or Flight Sudden or ongoing stress activates your nervous system and floods your bloodstream with adrenaline and cortisol, two hormones that raise blood pressure, increase heart rate and  spike blood sugar. These changes pitch your body into a fight or flight response. That enabled our ancestors to outrun saber-toothed tigers, and it's helpful today for situations like dodging a car accident. But most modern chronic stressors, such as finances or a challenging relationship, keep your body in that heightened state, which hurts your health. Effects of Too Much Stress If constantly under stress, most of [place] will eventually start to function less well.  Multiple studies link chronic stress to a higher risk of heart disease, stroke, depression, weight gain, memory loss and even premature death, so it's important to recognize the warning signals. Talk to your doctor about ways to manage stress if you're experiencing any of these symptoms: Prolonged periods of poor sleep. Regular, severe headaches. Unexplained weight loss or gain. Feelings of isolation, withdrawal or worthlessness. Constant anger and irritability. Loss of interest in activities. Constant worrying or obsessive thinking. Excessive alcohol or drug use. Inability to concentrate.  10 Ways to Cope with Chronic Stress It's key to recognize stressful situations as they occur because it allows you to focus on managing how you  react. We all need to know when to close our eyes and take a deep breath when we feel tension rising. Use these tips to prevent or reduce chronic stress. 1. Rebalance Work and Home All work and no play? If you're spending too much time at the office, intentionally put more dates in your calendar to enjoy time for fun, either alone or with others. 2. Get Regular Exercise Moving your body on a regular basis balances the nervous system and increases blood circulation, helping to flush out stress hormones. Even a daily 20-minute walk makes a difference. Any kind of exercise can lower stress and improve your mood ? just pick activities that you enjoy and make it a regular habit. 3. Eat Well and Limit Alcohol and  Stimulants Alcohol, nicotine and caffeine may temporarily relieve stress but have negative health impacts and can make stress worse in the long run. Well-nourished bodies cope better, so start with a good breakfast, add more organic fruits and vegetables for a well-balanced diet, avoid processed foods and sugar, try herbal tea and drink more water. 4. Connect with Supportive People Talking face to face with another person releases hormones that reduce stress. Lean on those good listeners in your life. 5. [place] Time Do you enjoy gardening, reading, listening to music or some other creative pursuit? Engage in activities that bring you pleasure and joy; research shows that reduces stress by almost half and lowers your heart rate, too. 6. Practice Meditation, Stress Reduction or Yoga Relaxation techniques activate a state of restfulness that counterbalances your body's fight-or-flight hormones. Even if this also means a 10-minute break in a long day: listen to music, read, go for a walk in nature, do a hobby, take a bath or spend time with a friend. Also consider doing a mindfulness exercise or try a daily deep breathing or imagery practice. Deep Breathing Slow, calm and deep breathing can help you relax. Try these steps to focus on your breathing and repeat as needed. Find a comfortable position and close your eyes. Exhale and drop your shoulders. Breathe in through your nose; fill your lungs and then your belly. Think of relaxing your body, quieting your mind and becoming calm and peaceful. Breathe out slowly through your nose, relaxing your belly. Think of releasing tension, pain, worries or distress. Repeat steps three and four until you feel relaxed. Imagery This involves using your mind to excite the senses -- sound, vision, smell, taste and feeling. This may help ease your stress. Begin by getting comfortable and then do some slow breathing. Imagine a place you love being at. It could  be somewhere from your childhood, somewhere you vacationed or just a place in your imagination. Feel how it is to be in the place you're imagining. Pay attention to the sounds, air, colors, and who is there with you. This is a place where you feel cared for and loved. All is well. You are safe. Take in all the smells, sounds, tastes and feelings. As you do, feel your body being nourished and healed. Feel the calm that surrounds you. Breathe in all the good. Breathe out any discomfort or tension. 7. Sleep Enough If you get less than seven to eight hours of sleep, your body won't tolerate stress as well as it could. If stress keeps you up at night, address the cause, and add extra meditation into your day to make up for the lost z's. Try to get seven to nine hours of sleep  each night. Make a regular bedtime schedule. Keep your room dark and cool. Try to avoid computers, TV, cell phones and tablets before bed. 8. Bond with Connections You Enjoy Go out for a coffee with a friend, chat with a neighbor, call a family member, visit with a clergy member, or even hang out with your pet. Clinical studies show that spending even a short time with a companion animal can cut anxiety levels almost in half. 9. Take a Vacation Getting away from it all can reset your stress tolerance by increasing your mental and emotional outlook, which makes you a happier, more productive person upon return. Leave your cellphone and laptop at home! 10. See a Counselor, Coach or Therapist If negative thoughts overwhelm your ability to make positive changes, it's time to seek professional help. Make an appointment today--your health and life are worth it."       Follow up:  Patient agrees to Care Plan and Follow-up.  Plan: The Managed Medicaid care management team will reach out to the patient again over the next 30 days.  Date/time of next scheduled Social Work care management/care coordination outreach:  07/30/22 at 930  am  Dickie La, BSW, MSW, [employer] Managed Medicaid LCSW [facility]  [facility] [email or web address] Phone: [phone]

## 2022-07-10 ENCOUNTER — Inpatient Hospital Stay (HOSPITAL_COMMUNITY)
Admission: AD | Admit: 2022-07-10 | Discharge: 2022-07-10 | Disposition: A | Discharge status: Home / Self Care | Payer: Medicaid Other | Attending: Obstetrics and Gynecology | Admitting: Obstetrics and Gynecology

## 2022-07-10 ENCOUNTER — Other Ambulatory Visit: Payer: Self-pay

## 2022-07-10 ENCOUNTER — Ambulatory Visit
Admission: EM | Admit: 2022-07-10 | Discharge: 2022-07-10 | Disposition: A | Discharge status: Home / Self Care | Payer: Medicaid Other

## 2022-07-10 DIAGNOSIS — F32A Depression, unspecified: Secondary | ICD-10-CM | POA: Insufficient documentation

## 2022-07-10 DIAGNOSIS — F419 Anxiety disorder, unspecified: Secondary | ICD-10-CM | POA: Insufficient documentation

## 2022-07-10 DIAGNOSIS — Z202 Contact with and (suspected) exposure to infections with a predominantly sexual mode of transmission: Secondary | ICD-10-CM | POA: Insufficient documentation

## 2022-07-10 DIAGNOSIS — T7840XA Allergy, unspecified, initial encounter: Secondary | ICD-10-CM | POA: Diagnosis not present

## 2022-07-10 DIAGNOSIS — Z113 Encounter for screening for infections with a predominantly sexual mode of transmission: Secondary | ICD-10-CM | POA: Insufficient documentation

## 2022-07-10 DIAGNOSIS — O26891 Other specified pregnancy related conditions, first trimester: Secondary | ICD-10-CM | POA: Insufficient documentation

## 2022-07-10 DIAGNOSIS — O99341 Other mental disorders complicating pregnancy, first trimester: Secondary | ICD-10-CM | POA: Diagnosis not present

## 2022-07-10 DIAGNOSIS — Z3A09 9 weeks gestation of pregnancy: Secondary | ICD-10-CM | POA: Insufficient documentation

## 2022-07-10 LAB — URINALYSIS, ROUTINE W REFLEX MICROSCOPIC
Bilirubin Urine: NEGATIVE
Glucose, UA: NEGATIVE mg/dL
Hgb urine dipstick: NEGATIVE
Ketones, ur: NEGATIVE mg/dL
Leukocytes,Ua: NEGATIVE
Nitrite: NEGATIVE
Protein, ur: NEGATIVE mg/dL
Specific Gravity, Urine: 1.017 (ref 1.005–1.030)
pH: 6 (ref 5.0–8.0)

## 2022-07-10 LAB — HIV ANTIBODY (ROUTINE TESTING W REFLEX): HIV Screen 4th Generation wRfx: NONREACTIVE

## 2022-07-10 LAB — WET PREP, GENITAL
Clue Cells Wet Prep HPF POC: NONE SEEN
Sperm: NONE SEEN
Trich, Wet Prep: NONE SEEN
WBC, Wet Prep HPF POC: <10 (ref <10)
Yeast Wet Prep HPF POC: NONE SEEN

## 2022-07-10 LAB — HEPATITIS C ANTIBODY: HCV Ab: NONREACTIVE

## 2022-07-10 LAB — HEPATITIS B SURFACE ANTIGEN: Hepatitis B Surface Ag: NONREACTIVE

## 2022-07-10 MED ORDER — DIPHENHYDRAMINE HCL 25 MG PO CAPS
50.0000 mg | ORAL_CAPSULE | Freq: Once | ORAL | Status: AC
  Administered 2022-07-10: 50 mg via ORAL
  Filled 2022-07-10: qty 2

## 2022-07-10 MED ORDER — FAMOTIDINE 20 MG PO TABS
40.0000 mg | ORAL_TABLET | Freq: Once | ORAL | Status: AC
  Administered 2022-07-10: 40 mg via ORAL
  Filled 2022-07-10: qty 2

## 2022-07-10 MED ORDER — AZITHROMYCIN 250 MG PO TABS
1000.0000 mg | ORAL_TABLET | Freq: Once | ORAL | Status: AC
Start: 2022-07-10 — End: 2022-07-10
  Administered 2022-07-10: 1000 mg via ORAL
  Filled 2022-07-10: qty 4

## 2022-07-10 MED ORDER — CEFTRIAXONE SODIUM 500 MG IJ SOLR
500.0000 mg | Freq: Once | INTRAMUSCULAR | Status: AC
Start: 2022-07-10 — End: 2022-07-10
  Administered 2022-07-10: 500 mg via INTRAMUSCULAR
  Filled 2022-07-10: qty 500

## 2022-07-10 NOTE — ED Triage Notes (Signed)
Pt said boyfriend was tested and tested positive for gonorrhea. Pt is [redacted] weeks pregnant. Some mild discharge. No odor,

## 2022-07-10 NOTE — MAU Note (Signed)
Andrea Jenkins is a 26 y.o. at [redacted]w[redacted]d here in MAU reporting: partner tested positive for gonorrhea and pt was told to come in and get treated. Yesterday started having some cramping. No bleeding or discharge.  Onset of complaint: ongoing  Pain score: 5/10  Vitals:   07/10/22 1057  BP: 108/84  Pulse: 97  Resp: 16  Temp: 98.4 F (36.9 C)  SpO2: 100%     Lab orders placed from triage: none

## 2022-07-10 NOTE — ED Triage Notes (Signed)
Pt was referred to MAU FOR ABDOMINAL PAIN BC OF PREGNANCY.

## 2022-07-10 NOTE — MAU Provider Note (Signed)
History     [id number]  Arrival date and time: 07/10/22 1025    Chief Complaint  Patient presents with   Exposure to STD     HPI Andrea Jenkins is a 26 y.o. at [redacted]w[redacted]d who presents for STD treatment. States she was told by her significant other that he tested positive for gonorrhea. Has had bilateral lower abdominal cramping since last night. Denies dysuria, vaginal discharge, or vaginal bleeding.    OB History     Gravida  4   Para  3   Term  3   Preterm  0   AB  0   Living  3      SAB  0   IAB  0   Ectopic  0   Multiple  0   Live Births  3           Past Medical History:  Diagnosis Date   Anxiety    Asthma    pt states she doesn't use inhaler much (04/10/19)   Depression    Gonorrhea    Heart murmur    when younger   Vaginal Pap smear, abnormal     Past Surgical History:  Procedure Laterality Date   CATARACT EXTRACTION     CHOLECYSTECTOMY N/A 10/30/2021   Procedure: LAPAROSCOPIC CHOLECYSTECTOMY;  Surgeon: Dwan Bolt, MD;  Location: [facility];  Service: General;  Laterality: N/A;   WISDOM TOOTH EXTRACTION     WRIST SURGERY      Family History  Problem Relation Age of Onset   Hypertension Mother    Miscarriages / [place] Mother    Hypertension Maternal Grandmother    Asthma Maternal Grandmother    Diabetes Maternal Grandmother    Vision loss Maternal Grandmother    Mental illness Brother    Cancer Maternal Aunt    Mental illness Sister    Heart disease Neg Hx     Allergies  Allergen Reactions   Nortrel 1-35 (21) [Norethin-Eth Estrad Triphasic] Anaphylaxis   Peach [Prunus Persica] Hives and Itching   Rocephin [Ceftriaxone] Hives and Itching    No current facility-administered medications on file prior to encounter.   Current Outpatient Medications on File Prior to Encounter  Medication Sig Dispense Refill   ARIPiprazole (ABILIFY) 5 MG tablet Take 1 tablet (5 mg total) by mouth daily. 30 tablet 2   EPINEPHrine (EPIPEN  2-PAK) 0.3 mg/0.3 mL IJ SOAJ injection Inject 0.3 mg into the muscle as needed for anaphylaxis. 1 each 1   famotidine (PEPCID) 20 MG tablet Take 1 tablet (20 mg total) by mouth 2 (two) times daily for 5 days. 10 tablet 0   Prenatal Vit-Fe Fumarate-FA (PREPLUS) 27-1 MG TABS Take 1 tablet by mouth daily. 30 tablet 13   promethazine (PHENERGAN) 12.5 MG tablet Take 1 tablet (12.5 mg total) by mouth every 6 (six) hours as needed for nausea or vomiting. 30 tablet 0   traZODone (DESYREL) 50 MG tablet Take 1 tablet (50 mg total) by mouth at bedtime. (Patient not taking: Reported on 06/02/2022) 30 tablet 2     ROS Pertinent positives and negative per HPI, all others reviewed and negative  Physical Exam   BP 123/68 (BP Location: Right Arm)   Pulse 87   Temp 98.3 F (36.8 C) (Oral)   Resp 16   Ht 5\' 4"  (1.626 m)   Wt 72.8 kg   LMP 05/03/2022 (Approximate)   SpO2 100% Comment: room air  BMI 27.53 kg/m  Patient Vitals for the past 24 hrs:  BP Temp Temp src Pulse Resp SpO2 Height Weight  07/10/22 1156 123/68 98.3 F (36.8 C) Oral 87 16 100 % -- --  07/10/22 1057 108/84 98.4 F (36.9 C) Oral 97 16 100 % -- --  07/10/22 1054 -- -- -- -- -- -- 5\' 4"  (1.626 m) 72.8 kg    Physical Exam Vitals and nursing note reviewed.  Constitutional:      General: She is not in acute distress.    Appearance: Normal appearance.  HENT:     Head: Normocephalic and atraumatic.  Eyes:     General: No scleral icterus.    Conjunctiva/sclera: Conjunctivae normal.  Pulmonary:     Effort: Pulmonary effort is normal. No respiratory distress.  Abdominal:     General: Abdomen is flat.     Palpations: Abdomen is soft.     Tenderness: There is no abdominal tenderness. There is no guarding or rebound.  Skin:    General: Skin is warm and dry.  Neurological:     Mental Status: She is alert.  Psychiatric:        Mood and Affect: Mood normal.        Behavior: Behavior normal.      Bedside Ultrasound Pt  informed that the ultrasound is considered a limited OB ultrasound and is not intended to be a complete ultrasound exam.  Patient also informed that the ultrasound is not being completed with the intent of assessing for fetal or placental anomalies or any pelvic abnormalities.  Explained that the purpose of today's ultrasound is to assess for  viability.  Patient acknowledges the purpose of the exam and the limitations of the study.     My interpretation: Active IUP, FHR 176 bpm    Labs Results for orders placed or performed during the hospital encounter of 07/10/22 (from the past 24 hour(s))  Wet prep, genital     Status: None   Collection Time: 07/10/22 11:13 AM  Result Value Ref Range   Yeast Wet Prep HPF POC NONE SEEN NONE SEEN   Trich, Wet Prep NONE SEEN NONE SEEN   Clue Cells Wet Prep HPF POC NONE SEEN NONE SEEN   WBC, Wet Prep HPF POC <10 <10   Sperm NONE SEEN   Urinalysis, Routine w reflex microscopic     Status: None   Collection Time: 07/10/22 11:13 AM  Result Value Ref Range   Color, Urine YELLOW YELLOW   APPearance CLEAR CLEAR   Specific Gravity, Urine 1.017 1.005 - 1.030   pH 6.0 5.0 - 8.0   Glucose, UA NEGATIVE NEGATIVE mg/dL   Hgb urine dipstick NEGATIVE NEGATIVE   Bilirubin Urine NEGATIVE NEGATIVE   Ketones, ur NEGATIVE NEGATIVE mg/dL   Protein, ur NEGATIVE NEGATIVE mg/dL   Nitrite NEGATIVE NEGATIVE   Leukocytes,Ua NEGATIVE NEGATIVE  HIV Antibody (routine testing w rflx)     Status: None   Collection Time: 07/10/22 11:48 AM  Result Value Ref Range   HIV Screen 4th Generation wRfx Non Reactive Non Reactive  Hepatitis B surface antigen     Status: None   Collection Time: 07/10/22 11:48 AM  Result Value Ref Range   Hepatitis B Surface Ag NON REACTIVE NON REACTIVE  Hepatitis C antibody     Status: None   Collection Time: 07/10/22 11:48 AM  Result Value Ref Range   HCV Ab NON REACTIVE NON REACTIVE    Imaging No results found.  MAU Course  Procedures Lab  Orders         Wet prep, genital         RPR         HIV Antibody (routine testing w rflx)         Hepatitis B surface antigen         Hepatitis C antibody         Urinalysis, Routine w reflex microscopic Urine, Clean Catch     Meds ordered this encounter  Medications   cefTRIAXone (ROCEPHIN) injection 500 mg    Order Specific Question:   Antibiotic Indication:    Answer:   STD   azithromycin (ZITHROMAX) tablet 1,000 mg   diphenhydrAMINE (BENADRYL) capsule 50 mg   famotidine (PEPCID) tablet 40 mg   Imaging Orders  No imaging studies ordered today    MDM Patient given rocephin & azithromycin for gonorrhea exposure. She would like full STI testing so orders were placed.   After receiving rocephin injection she complained of full body itching. Had some small hives on her anterior forearms & lower back. No angioedema, shortness of breath, trouble swallowing, or tachycardia. Given benadryl & pepcid. Reports feeling much better. Vital signs remained stable. Added rocephin to allergy list.  Assessment and Plan   1. Exposure to gonorrhea  -Received treatment in MAU -Partner has already been treated -No intercourse x 2 weeks  2. Screen for STD (sexually transmitted disease)  -HIV, RPR, HCV, & HBV ordered  3. Allergic reaction to drug, initial encounter  -Patient stable at time of discharge. Reviewed reasons to present to ED -Rocephin added to her allergy list  4. [redacted] weeks gestation of pregnancy      [name], NP 07/10/22 1:35 PM

## 2022-07-10 NOTE — MAU Note (Signed)
Pt reports that she is feeling itchy and having hives. CNM ordered benadryl.

## 2022-07-10 NOTE — Discharge Instructions (Signed)
Return to care  If you have heavier bleeding that soaks through more than 2 pads per hour for an hour or more If you bleed so much that you feel like you might pass out or you do pass out If you have significant abdominal pain that is not improved with Tylenol   

## 2022-07-11 LAB — RPR: RPR Ser Ql: NONREACTIVE

## 2022-07-12 ENCOUNTER — Other Ambulatory Visit: Payer: Self-pay | Admitting: *Deleted

## 2022-07-12 MED ORDER — DICLEGIS 10-10 MG PO TBEC: DELAYED_RELEASE_TABLET | ORAL | 3 refills | Status: DC

## 2022-07-12 NOTE — Progress Notes (Signed)
Pt needing Rx for nausea, states phenergan isn't helping. Diclegis ordered today.

## 2022-07-15 DIAGNOSIS — Z1388 Encounter for screening for disorder due to exposure to contaminants: Secondary | ICD-10-CM | POA: Diagnosis not present

## 2022-07-20 ENCOUNTER — Ambulatory Visit (INDEPENDENT_AMBULATORY_CARE_PROVIDER_SITE_OTHER): Payer: Medicaid Other | Admitting: *Deleted

## 2022-07-20 ENCOUNTER — Ambulatory Visit (INDEPENDENT_AMBULATORY_CARE_PROVIDER_SITE_OTHER): Payer: Medicaid Other | Admitting: Student in an Organized Health Care Education/Training Program

## 2022-07-20 ENCOUNTER — Encounter (HOSPITAL_COMMUNITY): Payer: Self-pay | Admitting: Student in an Organized Health Care Education/Training Program

## 2022-07-20 VITALS — BP 106/66 | HR 67 | Resp 24 | Wt 163.0 lb

## 2022-07-20 VITALS — BP 107/71 | HR 69 | Wt 161.0 lb

## 2022-07-20 DIAGNOSIS — F3162 Bipolar disorder, current episode mixed, moderate: Secondary | ICD-10-CM

## 2022-07-20 DIAGNOSIS — F99 Mental disorder, not otherwise specified: Secondary | ICD-10-CM | POA: Diagnosis not present

## 2022-07-20 DIAGNOSIS — Z3481 Encounter for supervision of other normal pregnancy, first trimester: Secondary | ICD-10-CM | POA: Diagnosis not present

## 2022-07-20 DIAGNOSIS — F5105 Insomnia due to other mental disorder: Secondary | ICD-10-CM | POA: Diagnosis not present

## 2022-07-20 MED ORDER — ARIPIPRAZOLE 5 MG PO TABS: 10.0000 mg | ORAL_TABLET | Freq: Every day | ORAL | 2 refills | Status: DC

## 2022-07-20 MED ORDER — TRAZODONE HCL 50 MG PO TABS: 50.0000 mg | ORAL_TABLET | Freq: Every day | ORAL | 2 refills | Status: DC

## 2022-07-20 NOTE — Progress Notes (Signed)
Tashanna presents for [facility] today. Exposure 9/16. Treatment with Rocephin. Advised that it is too soon for [facility] and if collected today results may not be accurate. Pt verbalized understanding and will wait until new OB appt 08/04/22 for TOC. Pt insistent on STI blood work today. New OB Panel and Genetic screening ordered to cover STI and prevent extra lab draws for pt. Approved by Dr. Elly Modena.

## 2022-07-20 NOTE — Progress Notes (Signed)
BH MD/PA/NP OP Progress Note  07/20/2022 4:23 PM Andrea Jenkins  MRN:  [id number]  Chief Complaint:  Chief Complaint  Patient presents with   Follow-up   Depression   HPI: Andrea Jenkins is a 26 year old female with a past psychiatric history significant for anxiety and bipolar 1 disorder who presents to [facility] for follow-up.  Patient reports she is currently [redacted] weeks pregnant.  Patient was previously prescribed the following regimen  Abilify 5 mg daily Hydroxyzine 10 mg 3 times daily as needed Trazodone 50 mg nightly  On assessment today patient reports that she did take the Abilify and hydroxyzine for approximately 2 months before running out, with no further refills.  Patient reports that she did not feel any significant changes with the Abilify or hydroxyzine and denies any adverse side effects.  Patient reports that she does believe she likely became pregnant with her current fetus during her most recent manic episode.  Patient reports that this child has a different father than her other 3 children.  Patient reports that she had suddenly had an increase in sexual activity around the time this child was conceived.  Patient reports that she believes she had been on the medicine the first 2 weeks of the pregnancy before running out.  Patient reports that lately she has had very little energy and feels like her mood has been "everywhere."  Patient reports that she has frequent nighttime awakenings and has had poor sleep chronically.  Patient endorses significant anhedonia, feelings of hopelessness/worthlessness and guilt.  Patient reports low energy, poor concentration and low appetite.  Patient reports she has passive SI at least 2-3 times per week.  Patient reports she has never had a plan and endorses her children as a protective factor.  Patient reports she constantly thinks about "who will take care of them" when she has sudden impulsive  thoughts.  Patient denies HI or AVH.  Patient reports that she does believe she had VH in 03/2022 when she initially presented to the [facility] during her manic episode.  Patient reports that lately it has been "hard to get myself together."  Patient reports that she is struggling with bathing, doing her hair, and brushing her teeth.  Patient reports that she does take care of her 3 young children but will often show up to their school not looking her best and is embarrassed.  Patient reports that when she is in a manic episode she feels like "I can rebuild my mom's whole house."  Patient reports that she would clean the entire house for at least a week, rearranged her room 3 times at minimum and will fold close for no reason over and over again.  Patient also endorses a history of impulse control problems.  Patient reports that when she is shopping she may purchase items and then suddenly have an urge to steal something despite paying for the other items.  Patient reports that this feeling comes out of nowhere and is hard to control.  Patient reports that she constantly feels like her thoughts are racing, but endorses that when she has more energy than normal and grandiosity of thought her thoughts continue to race.  Patient reports she will often drink alcohol to slow down her thoughts.  Patient reports she also feels alcohol helps her get through situations that make her more anxious.  Patient reports that around the time she got pregnant she does not recall sleeping at all.  Patient  does not endorse any significant anxiety today.  Patient reports that she frequently finds herself wondering about how she will afford life for herself and her 3 children.  Patient denies any symptoms of PTSD and does not endorse any history of trauma. Visit Diagnosis:    ICD-10-CM   1. Bipolar disorder, current episode mixed, moderate (HCC)  F31.62 ARIPiprazole (ABILIFY) 5 MG tablet    2. Insomnia due to other mental disorder   F51.05 traZODone (DESYREL) 50 MG tablet   F99       Past Psychiatric History:  Inpatient: Denies Outpatient: Endorses having postpartum depression and seeing someone in her OB office.  Patient reports she was started on Zoloft however she did not find this beneficial. No history of suicide attempts Previous diagnoses of MDD and anxiety Previous diagnoses in 03/2022 of bipolar 1 disorder  Past Medical History:  Past Medical History:  Diagnosis Date   Anxiety    Asthma    pt states she doesn't use inhaler much (04/10/19)   Depression    Gonorrhea    Heart murmur    when younger   Vaginal Pap smear, abnormal     Past Surgical History:  Procedure Laterality Date   CATARACT EXTRACTION     CHOLECYSTECTOMY N/A 10/30/2021   Procedure: LAPAROSCOPIC CHOLECYSTECTOMY;  Surgeon: Dwan Bolt, MD;  Location: [facility];  Service: General;  Laterality: N/A;   WISDOM TOOTH EXTRACTION     WRIST SURGERY      Family Psychiatric History:   Sister: 2-3 psychiatric hospitalizations and is on medication, diagnosis unknown patient  Family History:  Family History  Problem Relation Age of Onset   Hypertension Mother    Miscarriages / [place] Mother    Hypertension Maternal Grandmother    Asthma Maternal Grandmother    Diabetes Maternal Grandmother    Vision loss Maternal Grandmother    Mental illness Brother    Cancer Maternal Aunt    Mental illness Sister    Heart disease Neg Hx     Social History:  -Patient lives with her mother - Patient has 3 children also in the home ages 41, 74 and 88.  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 Th8: Denies Evading: Patient reports taking a puff from her relatives vape; however she has gotten rid of her own due to being pregnant  Social History   Socioeconomic History   Marital status: Single    Spouse name: Not on file   Number of children: 2   Years of education: 12   Highest education level: 12th grade  Occupational History   Occupation: [employer]   Tobacco Use   Smoking status: Former    Packs/day: 0.25    Types: Cigars, Cigarettes    Quit date: 03/11/2017    Years since quitting: 5.3    Passive exposure: Current   Smokeless tobacco: Never  Vaping Use   Vaping Use: Every day  Substance and Sexual Activity   Alcohol use: Not Currently    Comment: occ   Drug use: Not Currently    Types: Marijuana    Comment: occasioinally   Sexual activity: Not Currently    Partners: Male    Birth control/protection: None    Comment: Pregnant   Other Topics Concern   Not on file  Social History Narrative   Not on file   Social Determinants of Health   Financial Resource Strain: High Risk (12/26/2021)   Overall Financial Resource Strain (CARDIA)    Difficulty of Paying Living Expenses: Very hard  Food Insecurity: Food Insecurity Present (12/26/2021)   Hunger Vital Sign    Worried About [place] in the Last Year: Often true    Ran Out of Food in the Last Year: Often true  Transportation Needs: No Transportation Needs (04/30/2022)   PRAPARE - Hydrologist (Medical): No    Lack of Transportation (Non-Medical): No  Physical Activity: Inactive (12/26/2021)   Exercise Vital Sign    Days of Exercise per Week: 0 days    Minutes of Exercise per Session: 0 min  Stress: Stress Concern Present (07/02/2022)   [place]    Feeling of Stress : Rather much  Social Connections: Moderately Isolated (12/26/2021)   Social Connection and Isolation Panel [NHANES]    Frequency of Communication with Friends and Family: More than three times a week    Frequency of Social Gatherings with Friends and Family: More than three times a week    Attends Religious Services: 1 to 4 times per year    Active Member of [employer] or Organizations: No    Attends Archivist Meetings: Never    Marital Status: Never married    Allergies:  Allergies  Allergen Reactions   Nortrel 1-35 (21) [Norethin-Eth Estrad Triphasic] Anaphylaxis   Peach [Prunus Persica] Hives and Itching   Rocephin [Ceftriaxone] Hives and Itching    Metabolic Disorder Labs: Lab Results  Component Value Date   HGBA1C 4.9 09/24/2019   MPG 111 04/14/2015   No results found for: "PROLACTIN" No results found for: "CHOL", "TRIG", "HDL", "CHOLHDL", "VLDL", "LDLCALC" Lab Results  Component Value Date   TSH 1.430 03/18/2022    Therapeutic Level Labs: No results found for: "LITHIUM" No results found for: "VALPROATE" No results found for: "CBMZ"  Current Medications: Current Outpatient Medications  Medication Sig Dispense Refill   ARIPiprazole (ABILIFY) 5 MG tablet Take 2 tablets (10 mg total) by mouth daily. Take 1 tablet for 3 days then increase to 2 days 60 tablet 2   traZODone (DESYREL) 50 MG tablet Take 1 tablet (50 mg total) by mouth at bedtime. 30 tablet 2   DICLEGIS 10-10 MG TBEC 1 tab in AM, 1 tab mid afternoon 2 tabs at bedtime. Max dose 4 tabs daily. 100 tablet 3   EPINEPHrine (EPIPEN 2-PAK) 0.3 mg/0.3 mL IJ SOAJ injection Inject 0.3 mg into the muscle as needed for anaphylaxis. 1 each 1   famotidine (PEPCID) 20 MG tablet Take 1 tablet (20 mg total) by mouth 2 (two) times daily for 5 days. 10 tablet 0   Prenatal Vit-Fe Fumarate-FA (PREPLUS) 27-1 MG TABS Take 1 tablet by mouth daily. 30 tablet 13    promethazine (PHENERGAN) 12.5 MG tablet Take 1 tablet (12.5 mg total) by mouth every 6 (six) hours as needed for nausea or vomiting. 30 tablet 0  traZODone (DESYREL) 50 MG tablet Take 1 tablet (50 mg total) by mouth at bedtime. (Patient not taking: Reported on 06/02/2022) 30 tablet 2   No current facility-administered medications for this visit.     Musculoskeletal: Strength & Muscle Tone: within normal limits Gait & Station: normal Patient leans: N/A  Psychiatric Specialty Exam: Review of Systems  Constitutional:  Positive for fatigue.  Psychiatric/Behavioral:  Positive for decreased concentration, dysphoric mood and sleep disturbance. Negative for hallucinations and suicidal ideas.     Blood pressure 106/66, pulse 67, resp. rate (!) 24, weight 163 lb (73.9 kg), last menstrual period 05/03/2022, SpO2 100 %, not currently breastfeeding.Body mass index is 27.98 kg/m.  General Appearance: Casual, patient is wearing nightcap-patient did reveal to provider that her hair had not been combed in many days.  Patient endorses that today was her first abnormal [place]:  Fair, patient occasionally looks at phone complains that he does not take talks but does pay attention for most of assessment  Speech:  Clear and Coherent  Volume:  Normal  Mood:   "Everywhere"  Affect:  Congruent-full range  Thought Process:  Goal Directed  Orientation:  Full (Time, Place, and Person)  Thought Content: Illogical "I want to get a job right now, I want to make $1 million-I am looking for the answer of how to make $1 million as soon as possible,-maybe I should scam."  Suicidal Thoughts:  No  Homicidal Thoughts:  No  Memory:  Immediate;   Good Recent;   Good  Judgement:  Poor but improving  Insight:  Shallow  Psychomotor Activity:  Normal  Concentration:  Concentration: Poor  Recall:  NA  Fund of Knowledge: Fair  Language: Good  Akathisia:  NA    AIMS (if indicated): not done  Assets:   Communication Skills Housing Resilience  ADL's:  Intact today/some decline in the last few days  Cognition: WNL  Sleep:  Poor   Screenings: AUDIT    Flowsheet Row Patient Outreach Telephone from 04/30/2022 in [place] Patient Outreach Telephone from 12/25/2021 in [place] Coordination  Alcohol Use Disorder Identification Test Final Score (AUDIT) 6 4      GAD-7    Flowsheet Row Office Visit from 03/26/2022 in [facility] Office Visit from 03/18/2022 in [place] from 10/29/2019 in [place] for [facility]  Total GAD-7 Score 21 18 15       PHQ2-9    [place] Patient Outreach Telephone from 07/02/2022 in [place] Visit from 03/26/2022 in [facility] Patient Outreach Telephone from 12/25/2021 in [place] Routine Prenatal from 02/16/2021 in [place] Video Visit from 12/03/2020 in [place]  PHQ-2 Total Score 5 6 2 6 6   PHQ-9 Total Score 19 26 5 15 20       Flowsheet Row Admission (Discharged) from 06/05/2022 in [place] Assessment Unit Admission (Discharged) from 05/30/2022 in [place] Unit ED from 05/24/2022 in [facility] [place] No Risk No Risk No Risk        Assessment and Plan: Andrea Jenkins is a 26 year old female with a past psychiatric history significant for anxiety and bipolar 1 disorder who presents to [facility] for  follow-up.  Patient reports she is currently [redacted] weeks pregnant.  Patient history continues to suggest strong evidence of patient being bipolar 1, with a current depressed episode.  Patient appeared to have a  bright affect today although it did appear at times to be full range.  Patient's poor concentration was also noted as patient would spend time on her phone watching videos, but would put down the phone when having conversations with provider.  Patient's thought process appeared goal oriented and she does appear to have some poor judgment that is improving.  Patient is aware that her substance use is not beneficial to her fetus and does appear to be making some efforts to change.  However, patient appears to have a decent level of education and some of the anecdote she would say during assessment such as wanting to make $1 million right now, where a bit odd.  Patient seemed less convinced that this is possible and more preoccupied with having multiple thoughts about getting a job, talking to the provider, taking care of her children, taking care of herself all at the same time.  It also seemed the patient was having flight of ideas however she is easily redirectable and at times could redirect herself.  Patient and fetus has previously been exposed to Abilify at a low dose.  Would recommend retrying this medication again with an increase in following up with patient in approximately 2-3 weeks.  If patient continues to have very little to no improvement may have to change to another medication such as Seroquel.  Based on history patient does appear to be at increased risk for harm if she is not medicated for her bipolar disorder.  Bipolar 1, current episode depressed - Start Abilify 5 mg x 3 days then increase to 10 mg  Insomnia - Restart trazodone 50 mg nightly  Patient was provided with materials addressing trazodone and Abilify use during pregnancy from "mother to baby."  Patient was also educated on the dangers of uncontrolled bipolar disorder during pregnancy as well as education about taking Abilify and trazodone during pregnancy.  Collaboration of Care: Collaboration of Care:   Patient/Guardian was  advised Release of Information must be obtained prior to any record release in order to collaborate their care with an outside provider. Patient/Guardian was advised if they have not already done so to contact the registration department to sign all necessary forms in order for [place] to release information regarding their care.   Consent: Patient/Guardian gives verbal consent for treatment and assignment of benefits for services provided during this visit. Patient/Guardian expressed understanding and agreed to proceed.   PGY-3 Freida Busman, MD 07/20/2022, 4:23 PM

## 2022-07-21 LAB — CBC/D/PLT+RPR+RH+ABO+RUBIGG...
Antibody Screen: NEGATIVE
Basophils Absolute: 0 10*3/uL (ref 0.0–0.2)
Basos: 1 %
EOS (ABSOLUTE): 0.2 10*3/uL (ref 0.0–0.4)
Eos: 4 %
HCV Ab: NONREACTIVE
HIV Screen 4th Generation wRfx: NONREACTIVE
Hematocrit: 35.5 % (ref 34.0–46.6)
Hemoglobin: 11.5 g/dL (ref 11.1–15.9)
Hepatitis B Surface Ag: NEGATIVE
Immature Grans (Abs): 0 10*3/uL (ref 0.0–0.1)
Immature Granulocytes: 0 %
Lymphocytes Absolute: 2.1 10*3/uL (ref 0.7–3.1)
Lymphs: 42 %
MCH: 27.5 pg (ref 26.6–33.0)
MCHC: 32.4 g/dL (ref 31.5–35.7)
MCV: 85 fL (ref 79–97)
Monocytes Absolute: 0.3 10*3/uL (ref 0.1–0.9)
Monocytes: 6 %
Neutrophils Absolute: 2.4 10*3/uL (ref 1.4–7.0)
Neutrophils: 47 %
Platelets: 197 10*3/uL (ref 150–450)
RBC: 4.18 x10E6/uL (ref 3.77–5.28)
RDW: 13.6 % (ref 11.7–15.4)
RPR Ser Ql: NONREACTIVE
Rh Factor: POSITIVE
Rubella Antibodies, IGG: 1.64 index (ref >0.99)
WBC: 5.1 10*3/uL (ref 3.4–10.8)

## 2022-07-21 LAB — HCV INTERPRETATION

## 2022-07-23 ENCOUNTER — Inpatient Hospital Stay (HOSPITAL_COMMUNITY)
Admission: AD | Admit: 2022-07-23 | Discharge: 2022-07-23 | Disposition: A | Discharge status: Home / Self Care | Payer: Medicaid Other | Attending: Obstetrics & Gynecology | Admitting: Obstetrics & Gynecology

## 2022-07-23 DIAGNOSIS — E860 Dehydration: Secondary | ICD-10-CM | POA: Diagnosis not present

## 2022-07-23 DIAGNOSIS — R630 Anorexia: Secondary | ICD-10-CM | POA: Diagnosis not present

## 2022-07-23 DIAGNOSIS — Z3A11 11 weeks gestation of pregnancy: Secondary | ICD-10-CM | POA: Diagnosis not present

## 2022-07-23 LAB — URINALYSIS, ROUTINE W REFLEX MICROSCOPIC
Bilirubin Urine: NEGATIVE
Glucose, UA: NEGATIVE mg/dL
Hgb urine dipstick: NEGATIVE
Ketones, ur: 80 mg/dL — AB
Leukocytes,Ua: NEGATIVE
Nitrite: NEGATIVE
Protein, ur: NEGATIVE mg/dL
Specific Gravity, Urine: 1.024 (ref 1.005–1.030)
pH: 5 (ref 5.0–8.0)

## 2022-07-23 MED ORDER — SCOPOLAMINE 1 MG/3DAYS TD PT72
1.0000 | MEDICATED_PATCH | TRANSDERMAL | Status: DC
Start: 2022-07-23 — End: 2022-07-24

## 2022-07-23 MED ORDER — ONDANSETRON HCL 4 MG/2ML IJ SOLN: 4.0000 mg | Freq: Once | INTRAMUSCULAR | Status: DC

## 2022-07-23 MED ORDER — LACTATED RINGERS IV BOLUS: 1000.0000 mL | Freq: Once | INTRAVENOUS | Status: DC

## 2022-07-23 NOTE — MAU Note (Addendum)
Pt says she thinks she's dehydrated .  Says morning sickness  stopped 07-02-2022. Since last Friday - no appetite - body hurts , wants to sleep  Very nauseated  today  at 715pm , no vomiting , No diarrhea . Noone at home sick   [facility]- appointment Is 08-04-2022 with [facility]  When here before - gave Diclegis- hasnot taken  Today - took  XS Tyl at 12 - 2 tab

## 2022-07-24 ENCOUNTER — Inpatient Hospital Stay (HOSPITAL_COMMUNITY)
Admission: AD | Admit: 2022-07-24 | Discharge: 2022-07-24 | Disposition: A | Discharge status: Home / Self Care | Payer: Medicaid Other | Attending: Obstetrics and Gynecology | Admitting: Obstetrics and Gynecology

## 2022-07-24 ENCOUNTER — Encounter (HOSPITAL_COMMUNITY): Payer: Self-pay | Admitting: Obstetrics and Gynecology

## 2022-07-24 DIAGNOSIS — R630 Anorexia: Secondary | ICD-10-CM

## 2022-07-24 DIAGNOSIS — Z79899 Other long term (current) drug therapy: Secondary | ICD-10-CM | POA: Diagnosis not present

## 2022-07-24 DIAGNOSIS — O26891 Other specified pregnancy related conditions, first trimester: Secondary | ICD-10-CM | POA: Diagnosis not present

## 2022-07-24 DIAGNOSIS — R5383 Other fatigue: Secondary | ICD-10-CM | POA: Diagnosis not present

## 2022-07-24 DIAGNOSIS — Z3A11 11 weeks gestation of pregnancy: Secondary | ICD-10-CM | POA: Insufficient documentation

## 2022-07-24 LAB — URINALYSIS, ROUTINE W REFLEX MICROSCOPIC
Bilirubin Urine: NEGATIVE
Glucose, UA: NEGATIVE mg/dL
Hgb urine dipstick: NEGATIVE
Ketones, ur: NEGATIVE mg/dL
Nitrite: NEGATIVE
Protein, ur: NEGATIVE mg/dL
Specific Gravity, Urine: 1.014 (ref 1.005–1.030)
pH: 6 (ref 5.0–8.0)

## 2022-07-24 LAB — COMPREHENSIVE METABOLIC PANEL
ALT: 11 U/L (ref 0–44)
AST: 16 U/L (ref 15–41)
Albumin: 3.8 g/dL (ref 3.5–5.0)
Alkaline Phosphatase: 29 U/L — ABNORMAL LOW (ref 38–126)
Anion gap: 9 (ref 5–15)
BUN: 6 mg/dL (ref 6–20)
CO2: 23 mmol/L (ref 22–32)
Calcium: 9.7 mg/dL (ref 8.9–10.3)
Chloride: 104 mmol/L (ref 98–111)
Creatinine, Ser: 0.62 mg/dL (ref 0.44–1.00)
GFR, Estimated: >60 mL/min (ref >60)
Glucose, Bld: 80 mg/dL (ref 70–99)
Potassium: 4.1 mmol/L (ref 3.5–5.1)
Sodium: 136 mmol/L (ref 135–145)
Total Bilirubin: 0.5 mg/dL (ref 0.3–1.2)
Total Protein: 6.9 g/dL (ref 6.5–8.1)

## 2022-07-24 MED ORDER — LACTATED RINGERS IV SOLN
INTRAVENOUS | Status: DC
  Administered 2022-07-24: 1000 mL via INTRAVENOUS

## 2022-07-24 MED ORDER — POLYETHYLENE GLYCOL 3350 17 G PO PACK
17.0000 g | PACK | Freq: Every day | ORAL | Status: DC
  Filled 2022-07-24: qty 1

## 2022-07-24 MED ORDER — ONDANSETRON 8 MG PO TBDP: 8.0000 mg | ORAL_TABLET | Freq: Three times a day (TID) | ORAL | 0 refills | Status: DC | PRN

## 2022-07-24 MED ORDER — ONDANSETRON 4 MG PO TBDP
8.0000 mg | ORAL_TABLET | Freq: Once | ORAL | Status: AC
Start: 2022-07-24 — End: 2022-07-24
  Administered 2022-07-24: 8 mg via ORAL
  Filled 2022-07-24: qty 2

## 2022-07-24 NOTE — MAU Note (Signed)
...  Andrea Jenkins is a 26 y.o. at [redacted]w[redacted]d here in MAU reporting: She reports she believes she is dehydrated. She reports she was here in MAU last night and reports she left because she wasn't feeling well and wanted to go home and sleep. She reports all she does is sleep and reports her entire body "hurts." Denies nausea. She reports she has not eaten or drank anything in one week. Denies VB or LOF.   Nasal congestion started today. Throat itching. Denies cough. Endorses allergies.  Took Tylenol yesterday. None today. Has Diclegis but has not taken it. Prenatal Care appointment with [facility] on 10/11.  Onset of complaint: One week ago. Pain score: 6/10 body   FHT: 166 doppler Lab orders placed from triage:  UA

## 2022-07-24 NOTE — MAU Note (Signed)
Patient's nausea has ceased and LR bolus complete. Sandwich tray given.

## 2022-07-24 NOTE — MAU Provider Note (Signed)
History     CSN: [id number]  Arrival date and time: 07/24/22 8182   Event Date/Time   First Provider Initiated Contact with Patient 07/24/22 1044      Chief Complaint  Patient presents with   Fatigue   Nasal Congestion   Generalized Body Aches   Throat Itching.   HPI Patient Andrea Jenkins is a 26 y.o. [phone] here feeling very tired and with no appetitie. States she she has not been eating this week because of her fatigue.  She thinks it could be related to her mood; she has just been started on abilify.   She denies VB, contractions, dysuria, vaginal discharge. She denies abdominal pain.  She states that she has nasal stuffiness, body aches and throat itching but it is from her allergies. She denies fever, SOB. She states that her appeptite suppression could be due to her Bipolar meds, which she just re-started.   She came to MAU last night but left after waiting for a long time.  OB History     Gravida  4   Para  3   Term  3   Preterm  0   AB  0   Living  3      SAB  0   IAB  0   Ectopic  0   Multiple  0   Live Births  3           Past Medical History:  Diagnosis Date   Anxiety    Asthma    pt states she doesn't use inhaler much (04/10/19)   Depression    Gonorrhea    Heart murmur    when younger   Vaginal Pap smear, abnormal     Past Surgical History:  Procedure Laterality Date   CATARACT EXTRACTION     CHOLECYSTECTOMY N/A 10/30/2021   Procedure: LAPAROSCOPIC CHOLECYSTECTOMY;  Surgeon: Dwan Bolt, MD;  Location: [facility];  Service: General;  Laterality: N/A;   WISDOM TOOTH EXTRACTION     WRIST SURGERY      Family History  Problem Relation Age of Onset   Hypertension Mother    Miscarriages / [place] Mother    Hypertension Maternal Grandmother    Asthma Maternal Grandmother    Diabetes Maternal Grandmother    Vision loss Maternal Grandmother    Mental illness Brother    Cancer Maternal Aunt    Mental illness Sister     Heart disease Neg Hx     Social History   Tobacco Use   Smoking status: Former    Packs/day: 0.25    Types: Cigars, Cigarettes    Quit date: 03/11/2017    Years since quitting: 5.3    Passive exposure: Current   Smokeless tobacco: Never  Vaping Use   Vaping Use: Every day  Substance Use Topics   Alcohol use: Not Currently    Comment: occ   Drug use: Not Currently    Types: Marijuana    Comment: occasioinally    Allergies:  Allergies  Allergen Reactions   Nortrel 1-35 (21) [Norethin-Eth Estrad Triphasic] Anaphylaxis   Peach [Prunus Persica] Hives and Itching   Rocephin [Ceftriaxone] Hives and Itching    Medications Prior to Admission  Medication Sig Dispense Refill Last Dose   ARIPiprazole (ABILIFY) 5 MG tablet Take 2 tablets (10 mg total) by mouth daily. Take 1 tablet for 3 days then increase to 2 days 60 tablet 2    DICLEGIS 10-10 MG TBEC 1  tab in AM, 1 tab mid afternoon 2 tabs at bedtime. Max dose 4 tabs daily. 100 tablet 3    EPINEPHrine (EPIPEN 2-PAK) 0.3 mg/0.3 mL IJ SOAJ injection Inject 0.3 mg into the muscle as needed for anaphylaxis. 1 each 1    famotidine (PEPCID) 20 MG tablet Take 1 tablet (20 mg total) by mouth 2 (two) times daily for 5 days. 10 tablet 0    Prenatal Vit-Fe Fumarate-FA (PREPLUS) 27-1 MG TABS Take 1 tablet by mouth daily. 30 tablet 13    promethazine (PHENERGAN) 12.5 MG tablet Take 1 tablet (12.5 mg total) by mouth every 6 (six) hours as needed for nausea or vomiting. 30 tablet 0    traZODone (DESYREL) 50 MG tablet Take 1 tablet (50 mg total) by mouth at bedtime. (Patient not taking: Reported on 06/02/2022) 30 tablet 2    traZODone (DESYREL) 50 MG tablet Take 1 tablet (50 mg total) by mouth at bedtime. 30 tablet 2     Review of Systems  Constitutional: Negative.   HENT: Negative.    Respiratory: Negative.    Cardiovascular: Negative.   Gastrointestinal: Negative.   Genitourinary: Negative.   Musculoskeletal: Negative.   Neurological:  Negative.   Hematological: Negative.   Psychiatric/Behavioral: Negative.     Physical Exam   Blood pressure 111/64, pulse 81, temperature 98.4 F (36.9 C), temperature source Oral, resp. rate 15, height 5' 3.5" (1.613 m), weight 72.6 kg, last menstrual period 05/03/2022, SpO2 100 %, not currently breastfeeding.  Physical Exam Constitutional:      Appearance: Normal appearance.  Cardiovascular:     Rate and Rhythm: Normal rate and regular rhythm.  Pulmonary:     Effort: Pulmonary effort is normal.  Musculoskeletal:        General: Normal range of motion.  Skin:    General: Skin is warm.     Capillary Refill: Capillary refill takes less than 2 seconds.  Neurological:     Mental Status: She is alert.  Psychiatric:        Mood and Affect: Mood normal.        Behavior: Behavior normal.     MAU Course  Procedures  MDM -CBC from 9-27 was normal and CMP today was normal-no electrolyte imbalance at this time -UA was normal today, although had elevated ketones last night.  -FHR present by Doppler  Reassessment (12:29 PM) patient had zofran and has been eating a sandwich; she reports that she feels better   Assessment and Plan   1. Loss of appetite   2. [redacted] weeks gestation of pregnancy    -patient stable for discharge with recommendation to continue taking her Ability and to take Zofran as needed; will also add Miralax to help with constipation -she has no OB complaints at this time -keep follow up appt on 08/04/2022 at [facility] 07/24/2022, 12:29 PM

## 2022-07-25 LAB — PANORAMA PRENATAL TEST FULL PANEL:PANORAMA TEST PLUS 5 ADDITIONAL MICRODELETIONS: FETAL FRACTION: 7.4

## 2022-07-31 ENCOUNTER — Other Ambulatory Visit: Payer: Self-pay

## 2022-07-31 ENCOUNTER — Encounter (HOSPITAL_COMMUNITY): Payer: Self-pay | Admitting: Obstetrics and Gynecology

## 2022-07-31 ENCOUNTER — Inpatient Hospital Stay (HOSPITAL_COMMUNITY)
Admission: AD | Admit: 2022-07-31 | Discharge: 2022-07-31 | Disposition: A | Discharge status: Home / Self Care | Payer: Medicaid Other | Attending: Obstetrics and Gynecology | Admitting: Obstetrics and Gynecology

## 2022-07-31 DIAGNOSIS — O218 Other vomiting complicating pregnancy: Secondary | ICD-10-CM | POA: Diagnosis present

## 2022-07-31 DIAGNOSIS — K529 Noninfective gastroenteritis and colitis, unspecified: Secondary | ICD-10-CM | POA: Diagnosis not present

## 2022-07-31 DIAGNOSIS — Z3A12 12 weeks gestation of pregnancy: Secondary | ICD-10-CM | POA: Insufficient documentation

## 2022-07-31 LAB — URINALYSIS, ROUTINE W REFLEX MICROSCOPIC
Bilirubin Urine: NEGATIVE
Glucose, UA: NEGATIVE mg/dL
Hgb urine dipstick: NEGATIVE
Ketones, ur: NEGATIVE mg/dL
Leukocytes,Ua: NEGATIVE
Nitrite: NEGATIVE
Protein, ur: NEGATIVE mg/dL
Specific Gravity, Urine: 1.021 (ref 1.005–1.030)
pH: 5 (ref 5.0–8.0)

## 2022-07-31 MED ORDER — ONDANSETRON HCL 4 MG/2ML IJ SOLN
4.0000 mg | Freq: Once | INTRAMUSCULAR | Status: AC
Start: 2022-07-31 — End: 2022-07-31
  Administered 2022-07-31: 4 mg via INTRAVENOUS
  Filled 2022-07-31: qty 2

## 2022-07-31 MED ORDER — LACTATED RINGERS IV BOLUS
1000.0000 mL | Freq: Once | INTRAVENOUS | Status: AC
  Administered 2022-07-31: 1000 mL via INTRAVENOUS

## 2022-07-31 MED ORDER — ACETAMINOPHEN 500 MG PO TABS
1000.0000 mg | ORAL_TABLET | Freq: Once | ORAL | Status: AC
  Administered 2022-07-31: 1000 mg via ORAL
  Filled 2022-07-31: qty 2

## 2022-07-31 MED ORDER — LOPERAMIDE HCL 2 MG PO CAPS
2.0000 mg | ORAL_CAPSULE | Freq: Once | ORAL | Status: AC
  Administered 2022-07-31: 2 mg via ORAL
  Filled 2022-07-31: qty 1

## 2022-07-31 MED ORDER — LOPERAMIDE HCL 2 MG PO CAPS: 2.0000 mg | ORAL_CAPSULE | ORAL | 0 refills | Status: AC | PRN

## 2022-07-31 NOTE — MAU Note (Signed)
.  ROZELLE CAUDLE is a 26 y.o. at [redacted]w[redacted]d here in MAU reporting: she woke up this morning at 245 with N/V/D. Has not been able to stop HDMode.be cramping with diarrhea  LMP:  Onset of complaint: 245am Pain score: 2-3 Vitals:   07/31/22 1215  BP: (!) 106/57  Pulse: 88  Resp: 18  Temp: 98.3 F (36.8 C)     FHT:156 Lab orders placed from triage:

## 2022-07-31 NOTE — MAU Provider Note (Signed)
History     [id number]  Arrival date and time: 07/31/22 1155    Chief Complaint  Patient presents with   Emesis   Diarrhea     HPI Andrea Jenkins is a 26 y.o. at [redacted]w[redacted]d who presents for vomiting & diarrhea. Symptoms started this morning at 2 am. States she has had watery stools every 30 minutes. Every time she has diarrhea she vomits. Stool is pale green. No blood or foul odor. Had some mild abdominal cramping last night but none since then. Denies fever/chills, vaginal bleeding, dysuria. Unknown sick contacts. Hasn't treated symptoms.    OB History     Gravida  4   Para  3   Term  3   Preterm  0   AB  0   Living  3      SAB  0   IAB  0   Ectopic  0   Multiple  0   Live Births  3           Past Medical History:  Diagnosis Date   Anxiety    Asthma    pt states she doesn't use inhaler much (04/10/19)   Depression    Gonorrhea    Heart murmur    when younger   Vaginal Pap smear, abnormal     Past Surgical History:  Procedure Laterality Date   CATARACT EXTRACTION     CHOLECYSTECTOMY N/A 10/30/2021   Procedure: LAPAROSCOPIC CHOLECYSTECTOMY;  Surgeon: Dwan Bolt, MD;  Location: [facility];  Service: General;  Laterality: N/A;   WISDOM TOOTH EXTRACTION     WRIST SURGERY      Family History  Problem Relation Age of Onset   Hypertension Mother    Miscarriages / [place] Mother    Hypertension Maternal Grandmother    Asthma Maternal Grandmother    Diabetes Maternal Grandmother    Vision loss Maternal Grandmother    Mental illness Brother    Cancer Maternal Aunt    Mental illness Sister    Heart disease Neg Hx     Allergies  Allergen Reactions   Nortrel 1-35 (21) [Norethin-Eth Estrad Triphasic] Anaphylaxis   Peach [Prunus Persica] Hives and Itching   Rocephin [Ceftriaxone] Hives and Itching    No current facility-administered medications on file prior to encounter.   Current Outpatient Medications on File Prior to Encounter   Medication Sig Dispense Refill   ARIPiprazole (ABILIFY) 5 MG tablet Take 2 tablets (10 mg total) by mouth daily. Take 1 tablet for 3 days then increase to 2 days 60 tablet 2   DICLEGIS 10-10 MG TBEC 1 tab in AM, 1 tab mid afternoon 2 tabs at bedtime. Max dose 4 tabs daily. 100 tablet 3   EPINEPHrine (EPIPEN 2-PAK) 0.3 mg/0.3 mL IJ SOAJ injection Inject 0.3 mg into the muscle as needed for anaphylaxis. 1 each 1   famotidine (PEPCID) 20 MG tablet Take 1 tablet (20 mg total) by mouth 2 (two) times daily for 5 days. 10 tablet 0   ondansetron (ZOFRAN-ODT) 8 MG disintegrating tablet Take 1 tablet (8 mg total) by mouth every 8 (eight) hours as needed for nausea or vomiting. 60 tablet 0   Prenatal Vit-Fe Fumarate-FA (PREPLUS) 27-1 MG TABS Take 1 tablet by mouth daily. 30 tablet 13   promethazine (PHENERGAN) 12.5 MG tablet Take 1 tablet (12.5 mg total) by mouth every 6 (six) hours as needed for nausea or vomiting. 30 tablet 0   traZODone (DESYREL) 50 MG tablet  Take 1 tablet (50 mg total) by mouth at bedtime. 30 tablet 2     ROS Pertinent positives and negative per HPI, all others reviewed and negative  Physical Exam   BP (!) 106/57   Pulse 88   Temp 98.3 F (36.8 C)   Resp 18   Ht 5' 3.5" (1.613 m)   Wt 71.5 kg   LMP 05/03/2022 (Approximate)   BMI 27.50 kg/m   Patient Vitals for the past 24 hrs:  BP Temp Pulse Resp Height Weight  07/31/22 1215 (!) 106/57 98.3 F (36.8 C) 88 18 5' 3.5" (1.613 m) 71.5 kg    Physical Exam Vitals and nursing note reviewed.  Constitutional:      General: She is not in acute distress.    Appearance: Normal appearance. She is not toxic-appearing.  HENT:     Head: Normocephalic and atraumatic.     Mouth/Throat:     Mouth: Mucous membranes are dry.  Eyes:     General: No scleral icterus.    Conjunctiva/sclera: Conjunctivae normal.  Abdominal:     General: Abdomen is flat.     Palpations: Abdomen is soft.     Tenderness: There is no abdominal  tenderness. There is no guarding or rebound.  Skin:    General: Skin is warm and dry.  Neurological:     Mental Status: She is alert.  Psychiatric:        Mood and Affect: Mood normal.        Behavior: Behavior normal.       Labs Results for orders placed or performed during the hospital encounter of 07/31/22 (from the past 24 hour(s))  Urinalysis, Routine w reflex microscopic Urine, Clean Catch     Status: Abnormal   Collection Time: 07/31/22 12:21 PM  Result Value Ref Range   Color, Urine YELLOW YELLOW   APPearance HAZY (A) CLEAR   Specific Gravity, Urine 1.021 1.005 - 1.030   pH 5.0 5.0 - 8.0   Glucose, UA NEGATIVE NEGATIVE mg/dL   Hgb urine dipstick NEGATIVE NEGATIVE   Bilirubin Urine NEGATIVE NEGATIVE   Ketones, ur NEGATIVE NEGATIVE mg/dL   Protein, ur NEGATIVE NEGATIVE mg/dL   Nitrite NEGATIVE NEGATIVE   Leukocytes,Ua NEGATIVE NEGATIVE    Imaging No results found.  MAU Course  Procedures Lab Orders         Urinalysis, Routine w reflex microscopic Urine, Clean Catch    Meds ordered this encounter  Medications   lactated ringers bolus 1,000 mL   ondansetron (ZOFRAN) injection 4 mg   loperamide (IMODIUM) capsule 2 mg   acetaminophen (TYLENOL) tablet 1,000 mg   loperamide (IMODIUM A-D) 2 MG capsule    Sig: Take 1 capsule (2 mg total) by mouth as needed for up to 2 days for diarrhea or loose stools (do not take more than 8 doses per day).    Dispense:  20 capsule    Refill:  0    Order Specific Question:   Supervising Provider    Answer:   [place] Bing [id number]   Imaging Orders  No imaging studies ordered today    MDM FHT present via doppler  Given IV fluids, zofran, & imodium in MAU. Reports improvement in symptoms. No episodes of diarrhea or vomiting during MAU visit.  Assessment and Plan   1. Gastroenteritis presumed infectious   2. [redacted] weeks gestation of pregnancy    -Reviewed reasons to return to MAU -Rx imodium. Patient has antiemetic at  home.  -  bland diet -Should f/u with PCP if symptoms don't improve within 1-2 weeks   [name], NP 07/31/22 2:22 PM

## 2022-08-03 ENCOUNTER — Other Ambulatory Visit: Payer: Self-pay | Admitting: *Deleted

## 2022-08-03 NOTE — Patient Instructions (Signed)
Visit Information  Ms. Corey was given information about Medicaid Managed Care team care coordination services as a part of their Healthy [facility] Medicaid benefit. Jerlyn Ly Thueson verbally consented to engagement with the [facility] Managed Care team.   If you are experiencing a medical emergency, please call 911 or report to your local emergency department or urgent care.   If you have a non-emergency medical problem during routine business hours, please contact your provider's office and ask to speak with a nurse.   For questions related to your Healthy [facility] health plan, please call: [phone] or visit the homepage here: [email or web address]  If you would like to schedule transportation through your Healthy [facility] plan, please call the following number at least 2 days in advance of your appointment: [phone]  For information about your ride after you set it up, call Ride Assist at [phone]. Use this number to activate a Will Call pickup, or if your transportation is late for a scheduled pickup. Use this number, too, if you need to make a change or cancel a previously scheduled reservation.  If you need transportation services right away, call [phone]. The after-hours call center is staffed 24 hours to handle ride assistance and urgent reservation requests (including discharges) 365 days a year. Urgent trips include sick visits, hospital discharge requests and life-sustaining treatment.  Call the [place] at [phone], at any time, 24 hours a day, 7 days a week. If you are in danger or need immediate medical attention call 911.  If you would like help to quit smoking, call 1-800-QUIT-NOW 410-822-5611) OR Espaol: 1-855-Djelo-Ya (7-616-073-7106) o para ms informacin haga clic aqu or Text READY to 200-400 to register via text  Ms. Bazen,   Please see education materials related to pregnancy  provided by MyChart link.  Patient verbalizes understanding of instructions and care plan provided today and agrees to view in [place]. Active MyChart status and patient understanding of how to access instructions and care plan via MyChart confirmed with patient.     Telephone follow up appointment with Managed Medicaid care management team member scheduled for:09/02/22 @ 10:30am  Lurena Joiner RN, [place] RN Care Coordinator   Following is a copy of your plan of care:  Care Plan : RN Care Manager Plan of Care  Updates made by [name], RN since 08/03/2022 12:00 AM     Problem: Health Management Needs related to Health Maintenance, Anxiety and Depression      Long-Range Goal: Development of Plan of Care to address Health Management Needs related to Health Maintenance, Anxiety and Depression   Start Date: 12/18/2021  Expected End Date: 08/24/2022  Priority: High  Note:   Current Barriers:  Knowledge Deficits related to plan of care for management of Anxiety with Panic Symptoms, and Depression: depressed mood decreased appetite and Health Maintenance -Ms. Asare is now 13w 1d pregnant and is no longer experiencing nausea and vomiting. She has an upcoming appointment with OB/GYN on 08/04/22. She rescheduled with Behavioral Health and has an appointment on 08/10/22. She will be starting a new job at [employer] on 08/04/22 working 2nd shift.     RNCM Clinical Goal(s):  Patient will verbalize understanding of plan for management of Anxiety, Depression, and Health Maintenance as evidenced by verbalization of self monitoring activities take all medications exactly as prescribed and will call provider for medication related questions as evidenced by documentation in EMR    work  with social worker to address [place] Concerns  related to the management of Anxiety and Depression as evidenced by review of EMR and patient or social worker report      through collaboration with Consulting civil engineer, provider, and care team. -Met Work with Care Guide for food insecurity and needing a dental provider-Met  Interventions: Inter-disciplinary care team collaboration (see longitudinal plan of care) Evaluation of current treatment plan related to  self management and patient's adherence to plan as established by provider  Nausea and Vomiting:  (Status:  Goal on track:  Yes.)  Long Term Goal Recommended adequate rest   Advised to follow routine screening guidelines as outlined by obstetrics provider   Advised patient to take all medications with her to upcoming OB visit on 08/04/22 Discuss any concerns or questions with provider Keep all appointments including 08/04/22 with OB/GYN   Health Maintenance  (Status: Goal on Track (progressing): YES.) Long Term Goal  Evaluation of current treatment plan related to Anxiety, Depression, and Health Maintenance , Limited access to food self-management and patient's adherence to plan as established by provider. Discussed plans with patient for ongoing care management follow up and provided patient with direct contact information for care management team Reviewed medications with patient and discussed the importance of taking all medications as directed; Reviewed scheduled/upcoming provider appointments including OB/GYN on 08/04/22 and BSW on 08/05/22; Advised patient to attend rescheduled [place] appointment on 08/10/22  Patient Goals/Self-Care Activities: Take medications as prescribed   Attend church or other social activities Work with the social worker to address care coordination needs and will continue to work with the clinical team to address health care and disease management related needs call 1-800-273-TALK (toll free, 24 hour hotline) go to [facility] [address] [place] [phone]) call 911 if experiencing a Mental Health or [place] new patient appointment with provider of choice

## 2022-08-03 NOTE — Patient Outreach (Signed)
Medicaid Managed Care   Nurse Care Manager Note  08/03/2022 Name:  Andrea Jenkins MRN:  [id number] DOB:  01-30-1996  Andrea Jenkins is an 26 y.o. year old female who is a primary patient of Patient, No Pcp Per.  The [facility] Managed Care Coordination team was consulted for assistance with:    Obstetrics healthcare management needs  Andrea Jenkins was given information about Medicaid Managed Care Coordination team services today. [name] agreed to services and verbal consent obtained.  Engaged with patient by telephone for follow up visit in response to provider referral for case management and/or care coordination services.   Assessments/Interventions:  Review of past medical history, allergies, medications, health status, including review of consultants reports, laboratory and other test data, was performed as part of comprehensive evaluation and provision of chronic care management services.  SDOH (Social Determinants of Health) assessments and interventions performed: SDOH Interventions    Flowsheet Row Patient Outreach Telephone from 07/02/2022 in [place] Most recent reading at 07/02/2022 10:29 AM Patient Outreach Telephone from 04/30/2022 in [place] Most recent reading at 04/30/2022 11:21 AM Patient Outreach Telephone from 12/25/2021 in [place] Most recent reading at 12/26/2021  1:38 PM Telephone from 12/18/2021 in [employer] Most recent reading at 12/18/2021  3:51 PM Patient Outreach Telephone from 12/18/2021 in [place] Most recent reading at 12/18/2021  8:56 AM  SDOH Interventions       Food Insecurity Interventions -- -- [id number] Referral, Assist with [employer], Information systems manager ([facility] only), Other (Comment)  [place] Referral Other (Comment)  [Care Guide referral for food pantries]  Housing Interventions -- -- Intervention Not Indicated -- Intervention Not Indicated  Transportation Interventions -- Intervention Not Indicated Intervention Not Indicated -- Intervention Not Indicated  Depression Interventions/Treatment  Medication -- Patient refuses Treatment  [Only requesting list of resources] -- --  Financial Strain Interventions -- -- Other (Comment)  [Care Guide Assistance] -- --  Physical Activity Interventions -- -- Patient Refused -- --  Stress Interventions Offered [employer], Provide Counseling -- [place], Other (Comment)  [Offered Counseling - Patient Refused] -- --  Social Connections Interventions -- -- Intervention Not Indicated -- --       Care Plan  Allergies  Allergen Reactions   Nortrel 1-35 (21) [Norethin-Eth Estrad Triphasic] Anaphylaxis   Peach [Prunus Persica] Hives and Itching   Rocephin [Ceftriaxone] Hives and Itching    Medications Reviewed Today     Reviewed by [name], RN (Registered Nurse) on 08/03/22 at 1109  Med List Status: <None>   Medication Order Taking? Sig Documenting Provider Last Dose Status Informant  ARIPiprazole (ABILIFY) 5 MG tablet [id number] No Take 2 tablets (10 mg total) by mouth daily. Take 1 tablet for 3 days then increase to 2 days Damita Dunnings B, MD 07/30/2022 Active   DICLEGIS 10-10 MG TBEC [id number]  1 tab in AM, 1 tab mid afternoon 2 tabs at bedtime. Max dose 4 tabs daily. Constant, Peggy, MD  Active   EPINEPHrine (EPIPEN 2-PAK) 0.3 mg/0.3 mL IJ SOAJ injection [id number] No Inject 0.3 mg into the muscle as needed for anaphylaxis. [name], MD Taking Active   famotidine (PEPCID) 20 MG tablet [id number]  Take 1 tablet (20 mg total) by mouth 2 (two) times daily for 5 days.  [name], MD  Expired 03/11/22 2359   ondansetron (ZOFRAN-ODT) 8 MG disintegrating tablet [id number]   Take 1 tablet (8 mg total) by mouth every 8 (eight) hours as needed for nausea or vomiting. Starr Lake, CNM  Active   Prenatal Vit-Fe Fumarate-FA (PREPLUS) 27-1 MG TABS [id number] No Take 1 tablet by mouth daily. [name], CNM Taking Active   promethazine (PHENERGAN) 12.5 MG tablet [id number] No Take 1 tablet (12.5 mg total) by mouth every 6 (six) hours as needed for nausea or vomiting. [name], CNM Taking Active   traZODone (DESYREL) 50 MG tablet [id number]  Take 1 tablet (50 mg total) by mouth at bedtime. Freida Busman, MD  Active             Patient Active Problem List   Diagnosis Date Noted   Generalized anxiety disorder 03/26/2022   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   Bipolar disorder, current episode mixed, moderate ([place]) 03/26/2022   Fatigue 03/19/2022   Overweight (BMI 25.0-29.9) 03/19/2022   Depression    Menorrhagia    Allergic reaction to drug, initial encounter 03/06/2022   Acute cholecystitis 10/30/2021   Anxiety    Alpha thalassemia silent carrier 10/24/2019   LGSIL on Pap smear of cervix 10/20/2016    Conditions to be addressed/monitored per PCP order:   Obstetrics Healthcare Management  Care Plan : [place] of Care  Updates made by [name], RN since 08/03/2022 12:00 AM     Problem: Health Management Needs related to Health Maintenance, Anxiety and Depression      Long-Range Goal: Development of Plan of Care to address Health Management Needs related to Health Maintenance, Anxiety and Depression   Start Date: 12/18/2021  Expected End Date: 08/24/2022  Priority: High  Note:   Current Barriers:  Knowledge Deficits related to plan of care for management of Anxiety with Panic Symptoms, and Depression: depressed mood decreased appetite and Health Maintenance -Andrea Jenkins is now 13w 1d pregnant and is no longer experiencing nausea and vomiting. She has an upcoming appointment with OB/GYN on  08/04/22. She rescheduled with Behavioral Health and has an appointment on 08/10/22. She will be starting a new job at [employer] on 08/04/22 working 2nd shift.     RNCM Clinical Goal(s):  Patient will verbalize understanding of plan for management of Anxiety, Depression, and Health Maintenance as evidenced by verbalization of self monitoring activities take all medications exactly as prescribed and will call provider for medication related questions as evidenced by documentation in EMR    work with social worker to address [place] Concerns  related to the management of Anxiety and Depression as evidenced by review of EMR and patient or social worker report     through collaboration with Consulting civil engineer, provider, and care team. -Met Work with Care Guide for food insecurity and needing a dental provider-Met  Interventions: Inter-disciplinary care team collaboration (see longitudinal plan of care) Evaluation of current treatment plan related to  self management and patient's adherence to plan as established by provider  Nausea and Vomiting:  (Status:  Goal on track:  Yes.)  Long Term Goal Recommended adequate rest   Advised to follow routine screening guidelines as outlined by obstetrics provider   Advised patient to take all medications with her to upcoming OB visit on 08/04/22 Discuss any concerns or questions with provider Keep all appointments including 08/04/22 with OB/GYN   Health Maintenance  (  Status: Goal on Track (progressing): YES.) Long Term Goal  Evaluation of current treatment plan related to Anxiety, Depression, and Health Maintenance , Limited access to food self-management and patient's adherence to plan as established by provider. Discussed plans with patient for ongoing care management follow up and provided patient with direct contact information for care management team Reviewed medications with patient and discussed the importance of taking all medications as  directed; Reviewed scheduled/upcoming provider appointments including OB/GYN on 08/04/22 and BSW on 08/05/22; Advised patient to attend rescheduled [place] appointment on 08/10/22  Patient Goals/Self-Care Activities: Take medications as prescribed   Attend church or other social activities Work with the social worker to address care coordination needs and will continue to work with the clinical team to address health care and disease management related needs call 1-800-273-TALK (toll free, 24 hour hotline) go to [facility] [address] [place] [phone]) call 911 if experiencing a Mental Health or [place] new patient appointment with provider of choice       Follow Up:  Patient agrees to Care Plan and Follow-up.  Plan: The Managed Medicaid care management team will reach out to the patient again over the next 30 days.  Date/time of next scheduled RN care management/care coordination outreach:  09/02/22 @ 10:30am  Lurena Joiner RN, BSN [place] RN Care Coordinator

## 2022-08-04 ENCOUNTER — Ambulatory Visit (INDEPENDENT_AMBULATORY_CARE_PROVIDER_SITE_OTHER): Payer: Medicaid Other | Admitting: Licensed Clinical Social Worker

## 2022-08-04 ENCOUNTER — Encounter: Payer: Self-pay | Admitting: Obstetrics and Gynecology

## 2022-08-04 ENCOUNTER — Other Ambulatory Visit (HOSPITAL_COMMUNITY)
Admission: RE | Admit: 2022-08-04 | Discharge: 2022-08-04 | Disposition: A | Discharge status: Home / Self Care | Payer: Medicaid Other | Source: Ambulatory Visit | Attending: Obstetrics and Gynecology | Admitting: Obstetrics and Gynecology

## 2022-08-04 ENCOUNTER — Ambulatory Visit (INDEPENDENT_AMBULATORY_CARE_PROVIDER_SITE_OTHER): Payer: Medicaid Other | Admitting: Obstetrics and Gynecology

## 2022-08-04 VITALS — BP 109/64 | HR 69 | Wt 161.8 lb

## 2022-08-04 DIAGNOSIS — D563 Thalassemia minor: Secondary | ICD-10-CM

## 2022-08-04 DIAGNOSIS — Z3482 Encounter for supervision of other normal pregnancy, second trimester: Secondary | ICD-10-CM | POA: Diagnosis not present

## 2022-08-04 DIAGNOSIS — Z202 Contact with and (suspected) exposure to infections with a predominantly sexual mode of transmission: Secondary | ICD-10-CM | POA: Diagnosis present

## 2022-08-04 DIAGNOSIS — F419 Anxiety disorder, unspecified: Secondary | ICD-10-CM | POA: Diagnosis not present

## 2022-08-04 DIAGNOSIS — Z3A13 13 weeks gestation of pregnancy: Secondary | ICD-10-CM

## 2022-08-04 DIAGNOSIS — Z3480 Encounter for supervision of other normal pregnancy, unspecified trimester: Secondary | ICD-10-CM | POA: Insufficient documentation

## 2022-08-04 DIAGNOSIS — F3162 Bipolar disorder, current episode mixed, moderate: Secondary | ICD-10-CM

## 2022-08-04 DIAGNOSIS — O99340 Other mental disorders complicating pregnancy, unspecified trimester: Secondary | ICD-10-CM | POA: Diagnosis not present

## 2022-08-04 MED ORDER — FAMOTIDINE 20 MG PO TABS: 20.0000 mg | ORAL_TABLET | Freq: Two times a day (BID) | ORAL | 3 refills | Status: DC

## 2022-08-04 MED ORDER — PREPLUS 27-1 MG PO TABS: 1.0000 | ORAL_TABLET | Freq: Every day | ORAL | 13 refills | Status: DC

## 2022-08-04 NOTE — Progress Notes (Signed)
Pt presents for NOB visit. No concerns at this time.  

## 2022-08-04 NOTE — Progress Notes (Signed)
Subjective:  JANNET CALIP is a 25 y.o. G4P3003 at [redacted]w[redacted]d being seen today for her first OB visit. EDD by LMP and confirmed by U/S. 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 H/O Bipolar D/o, controlled with current medication.  She is currently monitored for the following issues for this low-risk pregnancy and has Alpha thalassemia silent carrier; Anxiety; Allergic reaction to drug, initial encounter; Depression; Menorrhagia; Fatigue; Overweight (BMI 25.0-29.9); Generalized anxiety disorder; Insomnia due to other mental disorder; Bipolar disorder, current episode mixed, moderate ([place]); Supervision of other normal pregnancy, antepartum; and Gonorrhea contact on their problem list.  Patient reports no complaints.  Contractions: Not present. Vag. Bleeding: None.  Movement: Present. Denies leaking of fluid.   The following portions of the patient's history were reviewed and updated as appropriate: allergies, current medications, past family history, past medical history, past social history, past surgical history and problem list. Problem list updated.  Objective:   Vitals:   08/04/22 0931  BP: 109/64  Pulse: 69  Weight: 161 lb 12.8 oz (73.4 kg)    Fetal Status: Fetal Heart Rate (bpm): 161   Movement: Present     General:  Alert, oriented and cooperative. Patient is in no acute distress.  Skin: Skin is warm and dry. No rash noted.   Cardiovascular: Normal heart rate noted  Respiratory: Normal respiratory effort, no problems with respiration noted  Abdomen: Soft, gravid, appropriate for gestational age. Pain/Pressure: Present     Pelvic:  Cervical exam deferred        Extremities: Normal range of motion.  Edema: None  Mental Status: Normal mood and affect. Normal behavior. Normal judgment and thought content.   Urinalysis:      Assessment and Plan:  Pregnancy: G4P3003 at [redacted]w[redacted]d  1. Supervision of other normal pregnancy, antepartum Prenatal labs and prenatal care reviewed with pt Genetic testing  reviewed with pt - Cervicovaginal ancillary only( [place]) - Culture, OB Urine - famotidine (PEPCID) 20 MG tablet; Take 1 tablet (20 mg total) by mouth 2 (two) times daily.  Dispense: 60 tablet; Refill: 3 - Prenatal Vit-Fe Fumarate-FA (PREPLUS) 27-1 MG TABS; Take 1 tablet by mouth daily.  Dispense: 30 tablet; Refill: 13  2. Bipolar disorder, current episode mixed, moderate (HCC) Stable Continue with current medication  3. Alpha thalassemia silent carrier Stable  4. Gonorrhea contact  - Cervicovaginal ancillary only( [place])  Preterm labor symptoms and general obstetric precautions including but not limited to vaginal bleeding, contractions, leaking of fluid and fetal movement were reviewed in detail with the patient. Please refer to After Visit Summary for other counseling recommendations.  Return in about 4 weeks (around 09/01/2022) for OB visit, face to face, any provider.   [name], MD

## 2022-08-04 NOTE — Patient Instructions (Signed)

## 2022-08-04 NOTE — Addendum Note (Signed)
Addended by: Lewie Loron D on: 08/04/2022 10:36 AM   Modules accepted: Orders

## 2022-08-05 ENCOUNTER — Other Ambulatory Visit: Payer: Self-pay

## 2022-08-05 NOTE — Patient Outreach (Signed)
Care Coordination  08/05/2022  Andrea Jenkins 07-20-1996 [id number]    Medicaid Managed Care   Unsuccessful Outreach Note  08/05/2022 Name: Andrea Jenkins MRN: [id number] DOB: [birth year]  Referred by: Patient, No Pcp Per Reason for referral : High Risk Managed Medicaid (MM Social Work Unsuccessful telephone outreach)   An unsuccessful telephone outreach was attempted today. The patient was referred to the case management team for assistance with care management and care coordination.   Follow Up Plan: The patient has been provided with contact information for the care management team and has been advised to call with any health related questions or concerns.   Mickel Fuchs, BSW, [place] Managed Medicaid Team  [phone]

## 2022-08-05 NOTE — Patient Instructions (Signed)
Visit Information  [name]  - as a part of your Medicaid benefit, you are eligible for care management and care coordination services at no cost or copay. I was unable to reach you by phone today but would be happy to help you with your health related needs. Please feel free to call me @ [phone]).   A member of the Managed Medicaid care management team will reach out to you again over the next 7 days.   Mickel Fuchs, BSW, [place] Managed Medicaid Team  [phone]

## 2022-08-06 LAB — CERVICOVAGINAL ANCILLARY ONLY
Bacterial Vaginitis (gardnerella): NEGATIVE
Candida Glabrata: NEGATIVE
Candida Vaginitis: NEGATIVE
Chlamydia: NEGATIVE
Comment: NEGATIVE
Comment: NEGATIVE
Comment: NEGATIVE
Comment: NEGATIVE
Comment: NEGATIVE
Comment: NORMAL
Neisseria Gonorrhea: NEGATIVE
Trichomonas: NEGATIVE

## 2022-08-06 LAB — CULTURE, OB URINE

## 2022-08-06 LAB — URINE CULTURE, OB REFLEX

## 2022-08-06 NOTE — BH Specialist Note (Signed)
Integrated Behavioral Health Follow Up In-Person Visit  MRN: [id number] Name: Andrea Jenkins  Number of [place] Clinician visits: 1 Session Start time:  1010am Session End time: 1031am Total time in minutes: 21 mins in person at [facility]   Types of Service: [place] (BHI)  Interpretor:No. Interpretor Name and Language: none  Subjective: Andrea Jenkins is a 26 y.o. female accompanied by n/a Patient was referred by Dr. Rip Harbour for hx of anxiety. Patient reports the following symptoms/concerns: hx of anxiety, limited social resources  Duration of problem: approx 3 months ; Severity of problem: mild  Objective: Mood: good and Affect: Appropriate Risk of harm to self or others: No plan to harm self or others  Life Context: Family and Social: Lives with mother and children  School/Work: unemp  Self-Care: n/a Life Changes: new pregnancy  Patient and/or Family's Strengths/Protective Factors: Concrete supports in place (healthy food, safe environments, etc.)  Goals Addressed: Patient will:  Reduce symptoms of: anxiety   Increase knowledge and/or ability of: coping skills   Demonstrate ability to: Increase adequate support systems for patient/family  Progress towards Goals: Ongoing  Interventions: Interventions utilized:  Motivational Interviewing Standardized Assessments completed: PHQ 9  Patient and/or Family Response: Andrea Jenkins reports anxious mood due to unemployment and having to move back with mother. Andrea Jenkins reports her pending charges are limiting her ability to secure housing.    Assessment: Patient currently experiencing anxiety affec pregnancy.   Patient may benefit from integrated beh health.  Plan: Follow up with behavioral health clinician on : 4 weeks Behavioral recommendations: Consult the local housing sources for family housing, prioritize rest and tasks to prevent burnout Referral(s): [place] (In Clinic) "From scale of 1-10, how likely are you to follow plan?":    Lynnea Ferrier, LCSW

## 2022-08-10 ENCOUNTER — Ambulatory Visit (HOSPITAL_COMMUNITY): Payer: Medicaid Other | Admitting: Student in an Organized Health Care Education/Training Program

## 2022-08-12 ENCOUNTER — Inpatient Hospital Stay (HOSPITAL_COMMUNITY)
Admission: AD | Admit: 2022-08-12 | Discharge: 2022-08-12 | Disposition: A | Discharge status: Home / Self Care | Payer: Medicaid Other | Attending: Obstetrics & Gynecology | Admitting: Obstetrics & Gynecology

## 2022-08-12 ENCOUNTER — Other Ambulatory Visit: Payer: Self-pay

## 2022-08-12 ENCOUNTER — Encounter (HOSPITAL_COMMUNITY): Payer: Self-pay | Admitting: Obstetrics & Gynecology

## 2022-08-12 DIAGNOSIS — K5900 Constipation, unspecified: Secondary | ICD-10-CM | POA: Diagnosis not present

## 2022-08-12 DIAGNOSIS — O99612 Diseases of the digestive system complicating pregnancy, second trimester: Secondary | ICD-10-CM | POA: Insufficient documentation

## 2022-08-12 DIAGNOSIS — Z87440 Personal history of urinary (tract) infections: Secondary | ICD-10-CM | POA: Insufficient documentation

## 2022-08-12 DIAGNOSIS — Z5986 Financial insecurity: Secondary | ICD-10-CM | POA: Insufficient documentation

## 2022-08-12 DIAGNOSIS — Z87891 Personal history of nicotine dependence: Secondary | ICD-10-CM | POA: Insufficient documentation

## 2022-08-12 DIAGNOSIS — O23592 Infection of other part of genital tract in pregnancy, second trimester: Secondary | ICD-10-CM | POA: Diagnosis not present

## 2022-08-12 DIAGNOSIS — R109 Unspecified abdominal pain: Secondary | ICD-10-CM

## 2022-08-12 DIAGNOSIS — O26892 Other specified pregnancy related conditions, second trimester: Secondary | ICD-10-CM | POA: Insufficient documentation

## 2022-08-12 DIAGNOSIS — Z3A14 14 weeks gestation of pregnancy: Secondary | ICD-10-CM | POA: Insufficient documentation

## 2022-08-12 DIAGNOSIS — M545 Low back pain, unspecified: Secondary | ICD-10-CM

## 2022-08-12 DIAGNOSIS — R102 Pelvic and perineal pain: Secondary | ICD-10-CM | POA: Insufficient documentation

## 2022-08-12 HISTORY — DX: Headache, unspecified: R51.9

## 2022-08-12 HISTORY — DX: Dermatitis, unspecified: L30.9

## 2022-08-12 HISTORY — DX: Urinary tract infection, site not specified: N39.0

## 2022-08-12 LAB — URINALYSIS, ROUTINE W REFLEX MICROSCOPIC
Bacteria, UA: NONE SEEN
Bilirubin Urine: NEGATIVE
Glucose, UA: NEGATIVE mg/dL
Hgb urine dipstick: NEGATIVE
Ketones, ur: NEGATIVE mg/dL
Leukocytes,Ua: NEGATIVE
Nitrite: NEGATIVE
Protein, ur: NEGATIVE mg/dL
Specific Gravity, Urine: 1.017 (ref 1.005–1.030)
pH: 7 (ref 5.0–8.0)

## 2022-08-12 LAB — WET PREP, GENITAL
Sperm: NONE SEEN
Trich, Wet Prep: NONE SEEN
WBC, Wet Prep HPF POC: >=10 — AB (ref <10)
Yeast Wet Prep HPF POC: NONE SEEN

## 2022-08-12 MED ORDER — ACETAMINOPHEN 500 MG PO TABS
1000.0000 mg | ORAL_TABLET | Freq: Once | ORAL | Status: AC
  Administered 2022-08-12: 1000 mg via ORAL
  Filled 2022-08-12: qty 2

## 2022-08-12 MED ORDER — METRONIDAZOLE 0.75 % VA GEL: 1.0000 | Freq: Every day | VAGINAL | 1 refills | Status: DC

## 2022-08-12 MED ORDER — CYCLOBENZAPRINE HCL 5 MG PO TABS: 10.0000 mg | ORAL_TABLET | Freq: Once | ORAL | Status: DC

## 2022-08-12 MED ORDER — CYCLOBENZAPRINE HCL 10 MG PO TABS: 10.0000 mg | ORAL_TABLET | Freq: Two times a day (BID) | ORAL | 0 refills | Status: DC | PRN

## 2022-08-12 NOTE — MAU Note (Signed)
Ongoing cramping.  Starts in lower abd, on the sides and goes around to the back.  Initially was able to take Tylenol and it would go away.  No it is staying.  Denies urinary problems.  Has been constipated, did not take anything for that.  Finally had a hard BM this morning.
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
## 2022-08-12 NOTE — MAU Note (Signed)
Andrea Jenkins is a 26 y.o. at [redacted]w[redacted]d here in MAU reporting: cramping for 8 days. States she came in today because it has not gone away. No bleeding. States increased discharge. Discharge is white and yellow.  Onset of complaint: ongoing  Pain score: 6/10  Vitals:   08/12/22 1644  BP: (!) 96/55  Pulse: 73  Resp: 16  Temp: 98.2 F (36.8 C)  SpO2: 100%     FHT:164  Lab orders placed from triage: UA

## 2022-08-13 LAB — CULTURE, OB URINE: Culture: NO GROWTH

## 2022-08-13 LAB — GC/CHLAMYDIA PROBE AMP (~~LOC~~) NOT AT ARMC
Chlamydia: NEGATIVE
Comment: NEGATIVE
Comment: NORMAL
Neisseria Gonorrhea: NEGATIVE

## 2022-08-17 ENCOUNTER — Encounter: Payer: Self-pay | Admitting: Obstetrics

## 2022-08-17 ENCOUNTER — Ambulatory Visit (INDEPENDENT_AMBULATORY_CARE_PROVIDER_SITE_OTHER): Payer: Medicaid Other | Admitting: Obstetrics

## 2022-08-17 VITALS — BP 106/56 | HR 73 | Wt 161.0 lb

## 2022-08-17 DIAGNOSIS — Z3A15 15 weeks gestation of pregnancy: Secondary | ICD-10-CM

## 2022-08-17 DIAGNOSIS — O26892 Other specified pregnancy related conditions, second trimester: Secondary | ICD-10-CM

## 2022-08-17 DIAGNOSIS — R109 Unspecified abdominal pain: Secondary | ICD-10-CM

## 2022-08-17 DIAGNOSIS — M545 Low back pain, unspecified: Secondary | ICD-10-CM

## 2022-08-17 DIAGNOSIS — Z3480 Encounter for supervision of other normal pregnancy, unspecified trimester: Secondary | ICD-10-CM

## 2022-08-17 NOTE — Progress Notes (Signed)
Subjective:  Andrea Jenkins is a 26 y.o. G4P3003 at [redacted]w[redacted]d being seen today for ongoing prenatal care.  She is currently monitored for the following issues for this low-risk pregnancy and has Alpha thalassemia silent carrier; Anxiety; Allergic reaction to drug, initial encounter; Depression; Menorrhagia; Fatigue; Overweight (BMI 25.0-29.9); Generalized anxiety disorder; Insomnia due to other mental disorder; Bipolar disorder, current episode mixed, moderate ([place]); Supervision of other normal pregnancy, antepartum; and Gonorrhea contact on their problem list.  Patient reports backache and vomiting.  Contractions: Not present. Vag. Bleeding: None.  Movement: Present. Denies leaking of fluid.   The following portions of the patient's history were reviewed and updated as appropriate: allergies, current medications, past family history, past medical history, past social history, past surgical history and problem list. Problem list updated.  Objective:   Vitals:   08/17/22 1054  BP: (!) 106/56  Pulse: 73  Weight: 161 lb (73 kg)    Fetal Status:     Movement: Present     General:  Alert, oriented and cooperative. Patient is in no acute distress.  Skin: Skin is warm and dry. No rash noted.   Cardiovascular: Normal heart rate noted  Respiratory: Normal respiratory effort, no problems with respiration noted  Abdomen: Soft, gravid, appropriate for gestational age. Pain/Pressure: Present     Pelvic:  Cervical exam deferred        Extremities: Normal range of motion.  Edema: None  Mental Status: Normal mood and affect. Normal behavior. Normal judgment and thought content.   Urinalysis:      Assessment and Plan:  Pregnancy: G4P3003 at [redacted]w[redacted]d  1. Supervision of other normal pregnancy, antepartum  2. Abdominal pain during pregnancy, second trimester - Round Ligament pain - Tylenol prn - Maternity Belt recommended  3. Low back pain during pregnancy in second trimester - Flexeril  prn   Preterm labor symptoms and general obstetric precautions including but not limited to vaginal bleeding, contractions, leaking of fluid and fetal movement were reviewed in detail with the patient. Please refer to After Visit Summary for other counseling recommendations.   Return in about 4 weeks (around 09/14/2022).   Shelly Bombard, MD

## 2022-08-17 NOTE — Progress Notes (Signed)
OB problem visit: pt reports low abdominal pain radiating to low back. 08/12/22 - negative urine cx at MAU

## 2022-08-25 ENCOUNTER — Telehealth (HOSPITAL_COMMUNITY): Payer: Medicaid Other | Admitting: Psychiatry

## 2022-08-30 ENCOUNTER — Encounter (HOSPITAL_COMMUNITY): Payer: Self-pay | Admitting: Family Medicine

## 2022-08-30 ENCOUNTER — Other Ambulatory Visit: Payer: Self-pay

## 2022-08-30 ENCOUNTER — Inpatient Hospital Stay (HOSPITAL_COMMUNITY)
Admission: AD | Admit: 2022-08-30 | Discharge: 2022-08-30 | Disposition: A | Discharge status: Home / Self Care | Payer: Medicaid Other | Attending: Family Medicine | Admitting: Family Medicine

## 2022-08-30 DIAGNOSIS — R102 Pelvic and perineal pain: Secondary | ICD-10-CM | POA: Diagnosis not present

## 2022-08-30 DIAGNOSIS — N949 Unspecified condition associated with female genital organs and menstrual cycle: Secondary | ICD-10-CM | POA: Diagnosis not present

## 2022-08-30 DIAGNOSIS — O26892 Other specified pregnancy related conditions, second trimester: Secondary | ICD-10-CM | POA: Insufficient documentation

## 2022-08-30 DIAGNOSIS — O99342 Other mental disorders complicating pregnancy, second trimester: Secondary | ICD-10-CM | POA: Diagnosis present

## 2022-08-30 DIAGNOSIS — F32A Depression, unspecified: Secondary | ICD-10-CM | POA: Insufficient documentation

## 2022-08-30 DIAGNOSIS — R109 Unspecified abdominal pain: Secondary | ICD-10-CM | POA: Diagnosis not present

## 2022-08-30 DIAGNOSIS — Z87891 Personal history of nicotine dependence: Secondary | ICD-10-CM | POA: Diagnosis not present

## 2022-08-30 DIAGNOSIS — Z3A17 17 weeks gestation of pregnancy: Secondary | ICD-10-CM | POA: Insufficient documentation

## 2022-08-30 DIAGNOSIS — F419 Anxiety disorder, unspecified: Secondary | ICD-10-CM | POA: Insufficient documentation

## 2022-08-30 DIAGNOSIS — O99332 Smoking (tobacco) complicating pregnancy, second trimester: Secondary | ICD-10-CM | POA: Diagnosis present

## 2022-08-30 LAB — URINALYSIS, ROUTINE W REFLEX MICROSCOPIC
Bilirubin Urine: NEGATIVE
Glucose, UA: NEGATIVE mg/dL
Hgb urine dipstick: NEGATIVE
Ketones, ur: NEGATIVE mg/dL
Leukocytes,Ua: NEGATIVE
Nitrite: NEGATIVE
Protein, ur: NEGATIVE mg/dL
Specific Gravity, Urine: 1.015 (ref 1.005–1.030)
pH: 6 (ref 5.0–8.0)

## 2022-08-30 LAB — CBC WITH DIFFERENTIAL/PLATELET
Abs Immature Granulocytes: 0.03 10*3/uL (ref 0.00–0.07)
Basophils Absolute: 0 10*3/uL (ref 0.0–0.1)
Basophils Relative: 0 %
Eosinophils Absolute: 0.1 10*3/uL (ref 0.0–0.5)
Eosinophils Relative: 2 %
HCT: 32.3 % — ABNORMAL LOW (ref 36.0–46.0)
Hemoglobin: 11 g/dL — ABNORMAL LOW (ref 12.0–15.0)
Immature Granulocytes: 0 %
Lymphocytes Relative: 25 %
Lymphs Abs: 1.7 10*3/uL (ref 0.7–4.0)
MCH: 28.4 pg (ref 26.0–34.0)
MCHC: 34.1 g/dL (ref 30.0–36.0)
MCV: 83.5 fL (ref 80.0–100.0)
Monocytes Absolute: 0.4 10*3/uL (ref 0.1–1.0)
Monocytes Relative: 5 %
Neutro Abs: 4.5 10*3/uL (ref 1.7–7.7)
Neutrophils Relative %: 68 %
Platelets: 142 10*3/uL — ABNORMAL LOW (ref 150–400)
RBC: 3.87 MIL/uL (ref 3.87–5.11)
RDW: 13.6 % (ref 11.5–15.5)
WBC: 6.8 10*3/uL (ref 4.0–10.5)
nRBC: 0 % (ref 0.0–0.2)

## 2022-08-30 MED ORDER — CAPSAICIN 0.025 % EX CREA: TOPICAL_CREAM | Freq: Two times a day (BID) | CUTANEOUS | 0 refills | Status: DC

## 2022-08-30 MED ORDER — CAPSAICIN 0.025 % EX CREA
TOPICAL_CREAM | Freq: Two times a day (BID) | CUTANEOUS | Status: DC
  Filled 2022-08-30: qty 60

## 2022-08-30 NOTE — Discharge Instructions (Signed)
Round Ligament Massage & Stretches  Massage: Starting at the middle of your pubic bone, trace little circles in a wide U from your pubic bone to your hip bones on both sides.  Then starting just above your pubic bone, press in and down, alternating sides to create a gentle rocking of your uterus back and forth.  Move your hands up the sides of your belly and back down. Do this 3-5 times upon waking and before bed.  Stretches: Get on hands and knees and alternate arching your back deeply while inhaling, and then rounding your back while exhaling. Modified runners lunge:  - Sit on a chair with half of your bottom on the chair and half off.  - Sit up tall, plant your front foot, and stretch your other foot out behind you.  - Breathe deeply for 5 breaths and then do the other side.  
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
## 2022-08-30 NOTE — MAU Note (Signed)
Andrea Jenkins is a 26 y.o. at [redacted]w[redacted]d here in MAU reporting: ongoing abdominal pain. States nothing is helping. Last flexeril was last Thursday, last tylenol was this AM. Had some spotting last week but none since then. No abnormal discharge.   Onset of complaint: ongoing  Pain score: 7/10  Vitals:   08/30/22 0951  BP: (!) 97/57  Pulse: 76  Resp: 16  Temp: 98.2 F (36.8 C)  SpO2: 100%     FHT:159  Lab orders placed from triage: UA

## 2022-08-31 ENCOUNTER — Encounter (HOSPITAL_COMMUNITY): Payer: Self-pay

## 2022-08-31 ENCOUNTER — Telehealth (HOSPITAL_COMMUNITY): Payer: Self-pay | Admitting: Professional

## 2022-08-31 ENCOUNTER — Ambulatory Visit (INDEPENDENT_AMBULATORY_CARE_PROVIDER_SITE_OTHER): Payer: Medicaid Other | Admitting: Licensed Clinical Social Worker

## 2022-08-31 DIAGNOSIS — F5089 Other specified eating disorder: Secondary | ICD-10-CM | POA: Insufficient documentation

## 2022-08-31 DIAGNOSIS — F3162 Bipolar disorder, current episode mixed, moderate: Secondary | ICD-10-CM

## 2022-08-31 NOTE — Plan of Care (Signed)
Pt verbally agreeable to plan  

## 2022-08-31 NOTE — Progress Notes (Signed)
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 Client is referred by medication provider for a depression, crying spells, and SI.   Client states mental health symptoms as evidenced by:   Depression Difficulty Concentrating; Fatigue; Hopelessness; Increase/decrease in appetite; Irritability; Sleep (too much or little); Tearfulness; Worthlessness Difficulty Concentrating; Fatigue; Hopelessness; Increase/decrease in appetite; Irritability; Sleep (too much or little); Tearfulness; Worthlessness  Duration of Depressive Symptoms Greater than two weeks Greater than two weeks  Mania Euphoria; Increased Energy; Irritability; Racing thoughts Euphoria; Increased Energy; Irritability; Racing thoughts  Anxiety Sleep; Worrying; Irritability; Restlessness; Difficulty concentrating; Fatigue Sleep; Worrying; Irritability; Restlessness; Difficulty concentrating; Fatigue  Psychosis HallucinationsPsychosis. Hallucinations. The comment is shadow figures. Taken on 08/31/22 1021 HallucinationsPsychosis. Hallucinations. The comment is shadow figures. Last Filed Value  Trauma None None  Compulsions Intended to reduce stress or prevent another outcome; Repeated behaviors/mental actsCompulsions. Intended to reduce stress or prevent another outcome; Repeated behaviors/mental acts. The comment is eating baby powder 3 x daily. Taken on 08/31/22 1021 Intended to reduce stress or prevent another outcome; Repeated behaviors/mental actsCompulsions. Intended to reduce stress or prevent another outcome; Repeated behaviors/mental acts. The comment is eating baby powder 3 x daily. Last Filed Value  Inattention None None  Hyperactivity/Impulsivity None None  Oppositional/Defiant  Behaviors None None  Emotional Irregularity Chronic feelings of emptiness Chronic feelings of emptiness   Client was screened for the following SDOH: Smoking, financials, exercise, social interaction, stress\tension, and depression  Assessment Information that integrates subjective and objective details with a therapist's professional interpretation:    Patient comes in today as a referral from medication provider at [facility]  Patient reports\endorses symptoms for tearfulness, worthlessness, hopelessness, poor appetite, sleeping too little, and difficulty concentrating.  She also reports symptoms for mania as euphoria, increased energy, irritability, and racing thoughts.  Patient states she has history of bipolar 1 disorder and is being managed by [facility] for Abilify.  Patient reports that she has not been taking her medications on a regular basis and feels that there needs to be a medication dosage increase.  LCSW notes that patient missed last medication management appointment and told patient she would need to reschedule labs in order to get increase in dosage.  LCSW spoke with patient about PHP through [facility] house 5 days weekly for multiple hours.  Patient was agreeable for referral being sent.  Stressors for patient include work, SI, financials, housing, and ex relationship, and family conflict.  Patient reports that she is [redacted] weeks pregnant with limited support through her children's father.  Patient states that she has 3 other children who she is currently caring for and due to the lack of childcare she has currently patient is unable to work.  Primary support systems include her family for mother and sister.  Patient reports reoccurring suicidal ideations with no plan or intent.  LCSW provided resources to the behavioral health urgent care.  Patient reports that she eats baby powder 3 times daily.   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  Client meets criteria for bipolar disorder and PICA  Client  states use of the following substances: None reported    Treatment recommendations are include plan: Delsa Sale to reschedule medication management appointment at [facility], patient referred to [facility].  LCSW scheduled patient for 2 appointments for individual therapy in December.    Clinician assisted client with scheduling the following appointments: December 6 10 AM. Clinician details of appointment.    Client was in agreement with treatment recommendations.  CCA Screening, Triage and Referral (STR)  Patient Reported Information How did you hear about [place]? Self   What Is the Reason for Your Visit/Call Today? seeking a mental health evaluation due to worsening depression symptoms. Pt reports lack of sleep, crying spells and anger outbursts. Pt states "I have not been good for a while". Pt states " I think I might be bipolar, I have high high's and low lows and my mind is racing constantly. Pt reports being diagnosed with depression/anxiety and reports being prescribed zoloft daily, but states that the medication "is not working". Pt reports that she does not have a psychiatrist, or therapist at the moment. Pt states that her OBGYN prescribed her medication. Pt denies SI at the moment. Pt states " I just need to get on the right meds and the right diagnosis".Pt denies NSSIB and AVH.  How Long Has This Been Causing You Problems? > than 6 months  What Do You Feel Would Help You the Most Today? Treatment for Depression or other mood problem   Have You Recently Been in Any Inpatient Treatment (Hospital/Detox/Crisis Center/28-Day Program)? No   Have You Ever Received Services From [employer] Before? Yes  Who Do You See at [facility]? [facility]   Have You  Recently Had Any Thoughts About Hurting Yourself? Yes  Are You Planning to Commit Suicide/Harm Yourself At This time? No   Have you Recently Had Thoughts About [place]? No    Have You Used Any Alcohol or Drugs in the Past 24 Hours? No  What Did You Use and How Much? none since finding out she has been pregnant  Do You Currently Have a Therapist/Psychiatrist? Yes  Name of Therapist/Psychiatrist: [place] Recently Discharged From Any [facility] or Programs? No     CCA Screening Triage Referral Assessment Type of Contact: Face-to-Face   Is CPS involved or ever been involved? Never  Is APS involved or ever been involved? Never   Patient Determined To Be At Risk for Harm To Self or Others Based on Review of Patient Reported Information or Presenting Complaint? No   Location of Assessment: [facility]   Does Patient Present under Involuntary Commitment? No data recorded IVC Papers Initial File Date: No data recorded  [place] of Residence: [place]   Patient Currently Receiving the Following Services: Medication Management   Determination of Need: Routine (7 days)   Options For Referral: Intensive Outpatient Therapy     CCA Biopsychosocial Intake/Chief Complaint:  Pt reports struggling with coping skills due to mental health for crying, feeling of being overwhelmed, tension and worry. Primary support system is her mother who she currently lives with. Pt has Hx of bipolar disorder and is currently taking abilify which she reports is not working. Pt reports that she is currently [redacted] weeks pregnant. Ilean states she has suicidal ideations with no plan or intent at this time.  Current Symptoms/Problems: tearfulness, reoccuring suicdal thoughts, lack of motivation, isolation,  Patient Reported Schizophrenia/Schizoaffective Diagnosis in Past: No   Strengths: willing to engage in treatment  Preferences:  therapy and medication mgnt  Abilities: playing sports prior to depression   Type of Services Patient Feels are Needed: therapy and medication   Initial Clinical Notes/Concerns: reoccuring SI   Mental Health Symptoms Depression:   Difficulty Concentrating; Fatigue; Hopelessness; Increase/decrease in appetite; Irritability; Sleep (too much or little); Tearfulness; Worthlessness   Duration of Depressive symptoms:  Greater than two weeks   Mania:   Euphoria; Increased Energy; Irritability; Racing thoughts   Anxiety:    Sleep; Worrying; Irritability; Restlessness; Difficulty concentrating; Fatigue   Psychosis:   Hallucinations (shadow figures)   Duration of Psychotic symptoms: No data recorded  Trauma:   None   Obsessions:  No data recorded  Compulsions:   Intended to reduce stress or prevent another outcome; Repeated behaviors/mental acts (eating baby powder 3 x daily)   Inattention:   None   Hyperactivity/Impulsivity:   None   Oppositional/Defiant Behaviors:   None   Emotional Irregularity:   Chronic feelings of emptiness   Other Mood/Personality Symptoms:  No data recorded   Mental Status Exam Appearance and self-care  Stature:   Average   Weight:   Average weight   Clothing:   Casual   Grooming:   Neglected   Cosmetic use:   Age appropriate   Posture/gait:   Normal   Motor activity:   Not Remarkable   Sensorium  Attention:   Normal   Concentration:   Normal   Orientation:   X5   Recall/memory:   Normal   Affect and Mood  Affect:   Anxious; Depressed; Flat   Mood:   Anxious; Depressed   Relating  Eye contact:   Normal   Facial expression:   Depressed; Anxious   Attitude toward examiner:   Cooperative   Thought and Language  Speech flow:  Slow; Soft   Thought content:   Appropriate to Mood and Circumstances   Preoccupation:   Suicide   Hallucinations:   Auditory (shadows)   Organization:  No data recorded   [employer] of Knowledge:   Fair   Intelligence:   Average   Abstraction:   Functional   Judgement:   Fair   Dance movement psychotherapist:  No data recorded  Insight:   Fair   Decision Making:   Impulsive   Social Functioning  Social Maturity:   Isolates   Social Judgement:  No data recorded  Stress  Stressors:   Relationship; Financial; Work; Housing; Family conflict   Coping Ability:   Normal   Skill Deficits:   Scientist, physiological; Interpersonal; Responsibility; Self-care; Self-control   Supports:   Family     Religion: Religion/Spirituality Are You A Religious Person?: No  Leisure/Recreation: Leisure / Recreation Do You Have Hobbies?: Yes Leisure and Hobbies: softball  Exercise/Diet: Exercise/Diet Do You Exercise?: No Have You Gained or Lost A Significant Amount of Weight in the Past Six Months?: No Do You Follow a Special Diet?: No Do You Have Any Trouble Sleeping?: Yes Explanation of Sleeping Difficulties: falling and staying asleep   CCA Employment/Education Employment/Work Situation: Employment / Work Situation Employment Situation: Unemployed Patient's Job has Been Impacted by Current Illness: Yes Describe how Patient's Job has Been Impacted: Pt reports lack of motivation, however 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 What is the Longest Time Patient has Held a Job?: 2 years Where was the Patient Employed  at that Time?: [employer] Has Patient ever Been in the [place]?: No  Education: Education Is Patient Currently Attending School?: No Last Grade Completed: 12 Did You Graduate From [employer]?: Yes Did You Attend College?: Yes What Type of College Degree Do you Have?: did not graduate Did [place]?: No Did You Have An Individualized Education Program (IIEP): No Did You Have Any Difficulty At School?: No Patient's Education Has Been Impacted by Current Illness: No   CCA  Family/Childhood History Family and Relationship History: Family history Marital status: Single Are you sexually active?: No What is your sexual orientation?: hetrosexual Has your sexual activity been affected by drugs, alcohol, medication, or emotional stress?: none reported Does patient have children?: Yes How many children?: 3 How is patient's relationship with their children?: great  Childhood History:  Childhood History By whom was/is the patient raised?: Mother Description of patient's relationship with caregiver when they were a child: Average Patient's description of current relationship with people who raised him/her: Good How were you disciplined when you got in trouble as a child/adolescent?: "I was not bad" Does patient have siblings?: Yes Number of Siblings: 3 Description of patient's current relationship with siblings: good Did patient suffer any verbal/emotional/physical/sexual abuse as a child?: No Did patient suffer from severe childhood neglect?: No Has patient ever been sexually abused/assaulted/raped as an adolescent or adult?: No Was the patient ever a victim of a crime or a disaster?: No Witnessed domestic violence?: No Has patient been affected by domestic violence as an adult?: No  Child/Adolescent Assessment:     CCA Substance Use Alcohol/Drug Use: Alcohol / Drug Use History of alcohol / drug use?: No history of alcohol / drug abuse  DSM5 Diagnoses: Patient Active Problem List   Diagnosis Date Noted   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   Supervision of other normal pregnancy, antepartum 08/04/2022   Gonorrhea contact 08/04/2022   Generalized anxiety disorder 03/26/2022   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   Bipolar disorder, current episode mixed, moderate ([place]) 03/26/2022   Fatigue 03/19/2022   Overweight (BMI 25.0-29.9) 03/19/2022   Depression    Menorrhagia    Allergic reaction to drug, initial encounter 03/06/2022   Anxiety    Alpha  thalassemia silent carrier 10/24/2019     Collaboration of Care: Other Referral to PHP   Patient/Guardian was advised Release of Information must be obtained prior to any record release in order to collaborate their care with an outside provider. Patient/Guardian was advised if they have not already done so to contact the registration department to sign all necessary forms in order for [place] to release information regarding their care.   Consent: Patient/Guardian gives verbal consent for treatment and assignment of benefits for services provided during this visit. Patient/Guardian expressed understanding and agreed to proceed.   Dory Horn, LCSW

## 2022-09-01 ENCOUNTER — Ambulatory Visit (INDEPENDENT_AMBULATORY_CARE_PROVIDER_SITE_OTHER): Payer: Medicaid Other | Admitting: Student

## 2022-09-01 ENCOUNTER — Other Ambulatory Visit (HOSPITAL_COMMUNITY)
Admission: RE | Admit: 2022-09-01 | Discharge: 2022-09-01 | Disposition: A | Discharge status: Home / Self Care | Payer: Medicaid Other | Source: Ambulatory Visit | Attending: Student | Admitting: Student

## 2022-09-01 ENCOUNTER — Encounter: Payer: Self-pay | Admitting: Student

## 2022-09-01 VITALS — BP 102/59 | HR 76 | Wt 163.0 lb

## 2022-09-01 DIAGNOSIS — D696 Thrombocytopenia, unspecified: Secondary | ICD-10-CM

## 2022-09-01 DIAGNOSIS — Z3480 Encounter for supervision of other normal pregnancy, unspecified trimester: Secondary | ICD-10-CM

## 2022-09-01 DIAGNOSIS — O26892 Other specified pregnancy related conditions, second trimester: Secondary | ICD-10-CM

## 2022-09-01 DIAGNOSIS — Z3492 Encounter for supervision of normal pregnancy, unspecified, second trimester: Secondary | ICD-10-CM | POA: Diagnosis not present

## 2022-09-01 DIAGNOSIS — Z3482 Encounter for supervision of other normal pregnancy, second trimester: Secondary | ICD-10-CM

## 2022-09-01 DIAGNOSIS — O99112 Other diseases of the blood and blood-forming organs and certain disorders involving the immune mechanism complicating pregnancy, second trimester: Secondary | ICD-10-CM

## 2022-09-01 DIAGNOSIS — Z3A17 17 weeks gestation of pregnancy: Secondary | ICD-10-CM

## 2022-09-01 DIAGNOSIS — R109 Unspecified abdominal pain: Secondary | ICD-10-CM

## 2022-09-01 NOTE — Patient Instructions (Signed)
AREA PEDIATRIC/FAMILY PRACTICE PHYSICIANS  Central/[place]outheast [place] ([address]) [facility] Chambliss, MD; Eniola, MD; Hale, MD; Hensel, MD; McDiarmid, MD; McIntyer, MD; Neal, MD; Walden, MD [address] [place], [place] [address] [phone] Mon-Fri 8:30-12:30, 1:30-5:00 Providers come to see babies at [facility] Accepting Medicaid [facility] providers who accept newborns: Koirala, MD; Morrow, MD; Wolters, MD [address], [place], [place] [address] [phone] Mon-Fri 8:00-5:30 Babies seen by providers at [facility] Does NOT accept Medicaid Please call early in hospitalization for appointment (limited availability)  [facility] Mulberry, MD [address] [place], [place] [address] [phone] Mon, Tue, Thur, Fri 8:30-5:00, Wed 10:00-7:00 (closed 1-2pm) Babies seen by [facility] providers Accepting Medicaid Rubin - Pediatrician Rubin, MD [address], [place], [place] [address] [phone] Mon-Fri 8:30-5:00, Sat 8:30-12:00 Provider comes to see babies at [facility] Accepting Medicaid Must have been referred from current patients or contacted office prior to delivery [facility] ([facility]) Brown, MD; Chandler, MD; Ettefagh, MD; Grant, MD; Lester, MD; McCormick, MD; McQueen, MD; Prose, MD; Simha, MD; Stanley, MD; Stryffeler, NP; Tebben, NP [address], [place], [place] [address] [phone] Mon, Tue, Thur, Fri 8:30-5:30, Wed 9:30-5:30, Sat 8:30-12:30 Babies seen by [facility] providers Accepting Medicaid Only accepting infants of first-time parents or siblings of current patients Hospital discharge coordinator will make follow-up appointment Jack Amos [address], [place], [place]  [address] [phone]   Fax - [phone] [facility] [address], [place], [place]  [address] Phone - [phone]   Fax - [phone] Shilpa Gosrani [address], [place], [place]  [address] [phone]  East/[place]ortheast [place] ([address]) [facility] Bates, MD; Brassfield, MD; Cooper, Cox, MD; MD; Davis, MD; Dovico, MD; Ettefaugh, MD; Little, MD; Lowe, MD; Keiffer, MD; Melvin, MD; Sumner, MD; Williams, MD [address], [place], [place] [address] [phone] Mon-Fri 8:30-5:00 (extended evenings Mon-Thur as needed), Sat-Sun 10:00-1:00 Providers come to see babies at [facility] Accepting Medicaid for families of first-time babies and families with all children in the household age 3 and under. Must register with office prior to making appointment (M-F only). [facility] Henson, NP; Knapp, MD; Lalonde, MD; Tysinger, PA [address] [place], [place] [address] [phone] Mon-Fri 8:00-5:00 Babies seen by providers at [facility] Does NOT accept Medicaid/Commercial Insurance Only [facility] ([facility])  Artis, MD; Barnes, MD; Bratton, MD; Coccaro, MD; [place] Gardner, MD; Kramer, MD; Marshall, MD; Netherton, MD; Poleto, MD; Skinner, MD [address] [place], [place] [address] [phone] Mon-Fri 8:30-5:30, Sat (Oct.-Mar.) 9:00-1:00 Babies seen by providers at [facility] Accepting Medicaid  [place] ([address]) [facility] Reid, MD; Warner, MD [address], [place], [place] [address] [phone] Mon-Fri 8:30-5:00, Sat 8:30-12:00 Providers come to see babies at [facility] Does NOT accept Medicaid [facility] Becker, PA; Hagler, MD; Scifres, PA; Sun, MD; Swayne, MD [address], [place],  [address] [phone] Mon-Fri 8:00-5:00 Babies seen by providers at [facility] Does NOT accept Medicaid Only accepting babies of parents who  are patients Please call early in hospitalization for appointment (limited availability)  Pediatricians Clark, MD; Frye, MD; Kelleher, MD; Mack, NP; Miller, MD; O'Keller, MD; Patterson, NP; Pudlo, MD; Puzio, MD; Thomas, MD; Tucker, MD; Twiselton, MD [address], [place], [place] [address] [phone] Mon-Fri 8:00-5:00, Sat 9:00-12:00 Providers come to see babies at [facility] Does NOT accept Medicaid  [place] ([address]) [facility] providers accepting new patients: Brake, NP; Wharton, PA [address], [place], [place] [address] [phone] Mon-Fri 8:00-5:00 Babies seen by providers at [facility] Does NOT accept Medicaid Only accepting babies of parents who are patients Please call early in hospitalization for appointment (limited availability) [facility] Gay, MD; Quinlan, MD [address] [place], [place] [address] [phone] (press 1 to schedule appointment) Mon-Fri 8:00-5:00 Providers come to see babies at [facility] Does NOT accept Medicaid [facility] Mazer, MD [address] [place], [place] [address] [phone] Mon-Fri 8:30-5:00 (lunch 12:30-1:00), extended hours by appointment only Wed 5:00-6:30 Babies seen by [facility] providers Accepting Medicaid [facility] Banks, MD; Jordan, MD; Koberlein, MD [address], [place], [place] [address] [phone] Mon-Fri 8:00-5:00 Babies seen by [facility] providers Does NOT accept Medicaid [facility] Parker, MD; Hunter, MD; Wallace, DO [address] [place], [place] [address] [phone] Mon-Fri 8:00-5:00 Babies seen by [facility] providers Does NOT accept Medicaid [facility] Brandon, PA; Brecken, PA; Christy, NP; Dees, MD; DeClaire, MD; DeWeese, MD; Hansen, NP; Mills, NP; Parrish, NP; Smoot, NP; Summer, MD; Vapne,  MD [address] [place], [place] [address] [phone] Mon-Fri 8:30-5:00, Sat 10:00-1:00 Providers come to see babies at [facility] Does NOT accept Medicaid Free prenatal information session Tuesdays at 4:45pm [facility] Bouska, MD; Gordon, PA; Jeffery, PA; Weber, PA [address] [place] [address] [phone] Mon-Fri 7:30-5:30 Babies seen by [facility] providers [facility] [address], [place], [place]  [address] [phone]   Fax - [phone]  [place] ([address] & [address]) [facility] [place] [address] [place], [place] [address] [phone] Mon-Thur 8:00-6:00 Providers come to see babies at [facility] Accepting Medicaid [facility] Anderson, NP; Badger, MD; Beal, PA; Spencer, PA [address] [place], [place] [address] [phone] Mon-Thur 7:30-7:30, Fri 7:30-4:30 Babies seen by [facility] providers Accepting Medicaid [facility] Agbuya, MD; Klett, NP; Romgoolam, MD [address], [place], [place] [address] [phone] Mon-Fri 8:30-5:00, Sat 8:30-12:00 Providers come to see babies at [facility] Accepting Medicaid Must have "Meet & Greet" appointment at office prior to delivery [facility] ([facility] McCord, MD; Wallace, MD; Wood, MD [address], [place], [place] [address] [phone] Mon-Wed 8:00-6:00, Thur-Fri 8:00-5:00, Sat 9:00-12:00 Providers come to see babies at [facility] Does NOT accept Medicaid Only accepting siblings of current patients [facility] [employer]  [address], [place], [place]  [address] [phone]   Fax - [phone] [facility] [address], [place], [place]  [address] [phone]   Fax -  [phone]  [place] ([address] & [address]) [facility] Cirigliano, DO; Matthews, DO [address] [place], [place] [address] [phone] Mon-Fri 7:00-5:00 Babies seen by [facility] providers Does NOT accept Medicaid [facility] Briscoe, MD; Howley, PA; Moreira, PA [address], [place], [place] [address] [phone] Mon-Fri 8:00-5:00 Babies seen by [facility] providers Accepting Medicaid [facility] Boyd, MD; Church, PA; Jones, NP; Osborn, PA [address], ,  [address] [phone] Mon-Fri 8:00-5:00 Babies seen by providers at [facility] Accepting Medicaid  [place] ([address]) [facility] [place] Wendling, DO [address] [place], [place] [address] [phone] Mon-Fri 8:00-5:00 Babies seen by [facility] providers Does NOT accept Medicaid Limited availability, please call early in hospitalization to schedule follow-up [facility] Calderon, PA; Cummings, MD; Dillard, MD; Martin, PA; Olson, MD; VanDeven, PA [address], [place], [place] [address] [phone] Mon-Fri 8:30-5:00, Sat 9:00-12:00 Babies seen by providers at [facility] Accepting Medicaid Please register online then schedule online or call office [email or web address] Hunter, NP; Kumar, MD; Martin Rogers, PA [address], [place], [place] [address] [phone] Mon-Fri 8:00-5:00 Babies seen by providers at [facility] Accepting Medicaid [facility] ([facility]) Nesbitt, MD; Kristi Fleenor, NP; West, MD [address], [place], [place] [address] [phone] Mon-Fri 8:00-5:30, Sat&Sun by appointment (phones open at  8:30) Babies seen by [facility] providers Accepting Medicaid Must be a first-time baby or sibling of current patient [facility] [place]  [address], [place], [place]  [address] [phone]   Fax - [phone]  [place] ([address]) [facility] Brown, PA; Cowen, PA; Rice, MD; Helton, PA; Spry, MD [address] [place], [place] [address] [phone] Mon-Thur 8:00-7:00, Fri 8:00-5:00, Sat 8:00-12:00, Sun 9:00-12:00 Babies seen by [facility] providers Accepting Medicaid [facility] Coe-Goins, MD; Marshall, MD; Pierre-Louis, MD [address] [place] [address], [place], [place] [address] [phone] Mon-Thur 8:00-5:00 Babies seen by providers at [facility] Accepting Medicaid [facility] Bratton, MD; Coe-Goins, MD; Hayes, MD; Lewis, MD; List, MD; Lott, MD; Marshall, MD; Moran, MD; O'Neal, MD; Pierre-Louis, MD; Pitonzo, MD; Scholer, MD; Spangle, MD [address] [place], [place] [address] [phone] Mon-Fri 8:00-5:30, Sat (Oct.-Mar.) 9:00-1:00 Babies seen by providers at [facility] Accepting Medicaid Must fill out new patient packet, available online at [email or web address] [facility] ([facility]) Friddle, NP; Harris, NP; Kelly, NP; Logan, MD; Melvin, PA; Poth, MD; Ramadoss, MD; Stanton, NP [address], [place], [place] [address] [phone] Mon-Thur 8:00-5:30, Fri 8:00-5:00 Babies seen by providers at [facility] Accepting Medicaid  [place] ([address]) [facility] Dixon, PA; Echelon, MD; Pickard, MD; Tapia, PA [address], [place], [place] [address] [phone] Mon-Fri 8:00-5:00 Babies seen by providers at [facility] Accepting Medicaid   [place] ([address]) [facility] [place] Masneri, DO; Meyers, MD; Nelson, PA [address], [place], [place] [address] [phone] Mon-Fri 8:00-5:00 Babies seen by providers at [facility] Does NOT accept Medicaid Limited appointment availability, please call early in hospitalization  [facility] [place] Kunedd, DO; McGowen, MD [address], [place], [place] [address] [phone] Mon-Fri 8:00-5:00 Babies seen by [facility] providers Does NOT accept Medicaid [facility] [place] Cameron, MD; MacDonald, MD; Michaels, PA; Nayak, MD [address] [place] [address], [place], [place] [address] [phone] Mon-Fri 8:00-5:00 After hours clinic ([address] [place], [place] [address]) [phone] Mon-Fri 5:00-8:00, Sat 12:00-6:00, Sun 10:00-4:00 Babies seen by [facility] providers Accepting Medicaid [facility] [place] [address], [place], [place]  [address] [phone]   Fax - [phone]  [place] ([address]) [facility] [place] [facility] Andy, MD [address], [place], [place] [address] [phone] Mon-Fri 8:00-5:00 Babies seen by [facility] providers Does NOT accept Medicaid [facility] [place] ([facility] [place]) Eksir, MD [address], [place], [place] [address] [phone] Mon-Thur 8:00-7:00, Fri 8:00-5:00, Sat 8:00-12:00 Babies seen by providers at [facility] Accepting Medicaid - but does not have vaccinations in office (must be received elsewhere) Limited availability, please call early in hospitalization  [place] ([address]) [facility]  Charlene Flemming, MD [address], [place] [address] [phone]  Fax [phone]  [facility]  Kimberly Newton, MD, Annamarie Streilein, PA, Carla Hampton, PA [address] [place], [place]  [address] [phone] [facility]  [address], [place], [place] [address] [phone] [address], [place], [place] [address] [phone] (West Office)  [facility] [address], [place], [place] [address] [phone] [facility] [address] [place], [place], [place] [address] [phone] [facility] [address], [place], [place] [address] [phone] [facility] [address], [place], [place] [address] [phone] [facility] [address], [place], [place] [address] [phone] [facility] [address], [place], [place] [address] [phone] [facility]  [address], [place], [place] [address] [phone] Dr[facility] [address] [place] [address], [place], [place] [address] [phone] [facility] [address], [place], [place] [address] [phone] [facility] [address], , [place] [address] [phone]  

## 2022-09-01 NOTE — Progress Notes (Signed)
Pt presents for ROB visit. No concerns at this time.  

## 2022-09-01 NOTE — Progress Notes (Signed)
PRENATAL VISIT NOTE  Subjective:  Andrea Jenkins is a 26 y.o. [phone] at [redacted]w[redacted]d being seen today for ongoing prenatal care.  She is currently monitored for the following issues for this low-risk pregnancy and has Alpha thalassemia silent carrier; Anxiety; Allergic reaction to drug, initial encounter; Depression; Menorrhagia; Fatigue; Overweight (BMI 25.0-29.9); Generalized anxiety disorder; Insomnia due to other mental disorder; Bipolar disorder, current episode mixed, moderate (HCC); Supervision of other normal pregnancy, antepartum; Gonorrhea contact; and Pica in adults on their problem list.  Patient reports backache and abdominal pain . Patient presented to [facility] 2-days ago for similar symptoms. Patient states a 7/10 consistent pressure in her abdomen and pain in her lower-back. Last week she had an episode of mild spotting that has resolved. Patient reports that pregnancy belt, capsaicin cream and tylenol are not helping. Thinks that she may have a yeast infection. Can't tell if her urinary urgency is abnormal for pregnancy. Denies dysuria, increased urinary frequency, odorous discharge, vaginal bleeding   Contractions: Not present. Vag. Bleeding: None.   . Denies leaking of fluid.   The following portions of the patient's history were reviewed and updated as appropriate: allergies, current medications, past family history, past medical history, past social history, past surgical history and problem list.   Objective:   Vitals:   09/01/22 1036  BP: (!) 102/59  Pulse: 76  Weight: 163 lb (73.9 kg)    Fetal Status: Fetal Heart Rate (bpm): 151         General:  Alert, oriented and cooperative. Patient is in no acute distress.  Skin: Skin is warm and dry. No rash noted.   Cardiovascular: Normal heart rate noted  Respiratory: Normal respiratory effort, no problems with respiration noted  Abdomen: Soft, gravid, appropriate for gestational age.  Pain/Pressure: Present     Pelvic: Cervical  exam deferred        Extremities: Normal range of motion.  Edema: None  Mental Status: Normal mood and affect. Normal behavior. Normal judgment and thought content.   Assessment and Plan:  Pregnancy: G4P3003 at [redacted]w[redacted]d 1. Supervision of other normal pregnancy, antepartum - Stable, but uncomfortable due to continued abdominal pain - Culture, OB Urine - Cervicovaginal ancillary only( [place]) - Comprehensive metabolic panel  2. [redacted] weeks gestation of pregnancy - Anatomy scan scheduled 09/14/22 - AFP, Serum, Open Spina Bifida  3. Benign gestational thrombocytopenia in second trimester (HCC) - Platelets - 142 K/uL - Provided reassurance that this is a mild presentation of GT in pregnancy. Plan for follow-up CBC at 28 week visit. Educated on s/s that presentation is in severe range.   4. Abdominal pain during pregnancy, second trimester - Culture, OB Urine - Cervicovaginal ancillary only( [place]) - Comprehensive metabolic panel  Preterm labor symptoms and general obstetric precautions including but not limited to vaginal bleeding, contractions, leaking of fluid and fetal movement were reviewed in detail with the patient. Please refer to After Visit Summary for other counseling recommendations.   Return in about 4 weeks (around 09/29/2022) for LOB, IN-PERSON, VIRTUAL.  Future Appointments  Date Time Provider Department Center  09/02/2022 10:30 AM Heidi Dach, RN [facility] None  09/10/2022  9:00 AM Bobbye Morton, MD [facility] None  09/14/2022  9:30 AM [facility] NURSE [facility]  09/14/2022  9:45 AM [facility] US6 [facility]  09/29/2022  8:55 AM Hermina Staggers, MD [facility] None  09/29/2022 10:00 AM Weber Cooks, LCSW [facility] None  10/14/2022 10:00 AM Weber Cooks,  LCSW [facility] None    Johnston Ebbs, NP

## 2022-09-02 ENCOUNTER — Encounter: Payer: Self-pay | Admitting: *Deleted

## 2022-09-02 ENCOUNTER — Other Ambulatory Visit: Payer: Self-pay | Admitting: *Deleted

## 2022-09-02 LAB — COMPREHENSIVE METABOLIC PANEL
ALT: 9 IU/L (ref 0–32)
AST: 15 IU/L (ref 0–40)
Albumin/Globulin Ratio: 1.4 (ref 1.2–2.2)
Albumin: 3.8 g/dL — ABNORMAL LOW (ref 4.0–5.0)
Alkaline Phosphatase: 38 IU/L — ABNORMAL LOW (ref 44–121)
BUN/Creatinine Ratio: 6 — ABNORMAL LOW (ref 9–23)
BUN: 4 mg/dL — ABNORMAL LOW (ref 6–20)
Bilirubin Total: <0.2 mg/dL (ref 0.0–1.2)
CO2: 20 mmol/L (ref 20–29)
Calcium: 9.1 mg/dL (ref 8.7–10.2)
Chloride: 103 mmol/L (ref 96–106)
Creatinine, Ser: 0.63 mg/dL (ref 0.57–1.00)
Globulin, Total: 2.7 g/dL (ref 1.5–4.5)
Glucose: 61 mg/dL — ABNORMAL LOW (ref 70–99)
Potassium: 4.2 mmol/L (ref 3.5–5.2)
Sodium: 137 mmol/L (ref 134–144)
Total Protein: 6.5 g/dL (ref 6.0–8.5)
eGFR: 127 mL/min/{1.73_m2} (ref >59)

## 2022-09-02 LAB — CERVICOVAGINAL ANCILLARY ONLY
Bacterial Vaginitis (gardnerella): NEGATIVE
Candida Glabrata: NEGATIVE
Candida Vaginitis: NEGATIVE
Chlamydia: NEGATIVE
Comment: NEGATIVE
Comment: NEGATIVE
Comment: NEGATIVE
Comment: NEGATIVE
Comment: NEGATIVE
Comment: NORMAL
Neisseria Gonorrhea: NEGATIVE
Trichomonas: NEGATIVE

## 2022-09-02 NOTE — Patient Instructions (Signed)
Visit Information  Ms. Saulsbury was given information about Medicaid Managed Care team care coordination services as a part of their Healthy [facility] Medicaid benefit. Jerlyn Ly Dejoseph verbally consented to engagement with the [facility] Managed Care team.   If you are experiencing a medical emergency, please call 911 or report to your local emergency department or urgent care.   If you have a non-emergency medical problem during routine business hours, please contact your provider's office and ask to speak with a nurse.   For questions related to your Healthy [facility] health plan, please call: [phone] or visit the homepage here: [email or web address]  If you would like to schedule transportation through your Healthy [facility] plan, please call the following number at least 2 days in advance of your appointment: [phone]  For information about your ride after you set it up, call Ride Assist at [phone]. Use this number to activate a Will Call pickup, or if your transportation is late for a scheduled pickup. Use this number, too, if you need to make a change or cancel a previously scheduled reservation.  If you need transportation services right away, call [phone]. The after-hours call center is staffed 24 hours to handle ride assistance and urgent reservation requests (including discharges) 365 days a year. Urgent trips include sick visits, hospital discharge requests and life-sustaining treatment.  Call the [place] at [phone], at any time, 24 hours a day, 7 days a week. If you are in danger or need immediate medical attention call 911.  If you would like help to quit smoking, call 1-800-QUIT-NOW (629)658-5127) OR Espaol: 1-855-Djelo-Ya (0-932-355-7322) o para ms informacin haga clic aqu or Text READY to 200-400 to register via text  Ms. Keahey,   Please see education materials related to pregnancy  provided by MyChart link.  Patient verbalizes understanding of instructions and care plan provided today and agrees to view in [place]. Active MyChart status and patient understanding of how to access instructions and care plan via MyChart confirmed with patient.     Telephone follow up appointment with Managed Medicaid care management team member scheduled for:10/06/22 @ 10:30am  Lurena Joiner RN, BSN [place] RN Care Coordinator   Following is a copy of your plan of care:  Care Plan : RN Care Manager Plan of Care  Updates made by [name], RN since 09/02/2022 12:00 AM     Problem: Health Management Needs related to Health Maintenance, Anxiety and Depression      Long-Range Goal: Development of Plan of Care to address Health Management Needs related to Health Maintenance, Anxiety and Depression   Start Date: 12/18/2021  Expected End Date: 03/25/2023  Priority: High  Note:   Current Barriers:  Knowledge Deficits related to plan of care for management of Anxiety with Panic Symptoms, and Depression: depressed mood decreased appetite and Health Maintenance -Ms. Andreoni continues to have no nausea and vomiting. She is working 2nd shift. She had an OB visit yesterday and will have an ultrasound on 09/14/22. Ms. Branscome denies any needs at this time, but would like RNCM to continue to follow.     RNCM Clinical Goal(s):  Patient will verbalize understanding of plan for management of Anxiety, Depression, and Health Maintenance as evidenced by verbalization of self monitoring activities take all medications exactly as prescribed and will call provider for medication related questions as evidenced by documentation in EMR    work with social worker to address [place] Concerns  related to the management of Anxiety and Depression as evidenced by review of EMR and patient or social worker report     through collaboration with Consulting civil engineer, provider, and care  team. -Met Work with Care Guide for food insecurity and needing a dental provider-Met  Interventions: Inter-disciplinary care team collaboration (see longitudinal plan of care) Evaluation of current treatment plan related to  self management and patient's adherence to plan as established by provider  Nausea and Vomiting:  (Status:  Goal Met.)  Long Term Goal Recommended adequate rest   Advised to follow routine screening guidelines as outlined by obstetrics provider   Advised patient to take all medications with her to upcoming OB visit on 08/04/22 Discuss any concerns or questions with provider Keep all appointments including 09/14/22 with MFM for OB Ultrasound and 09/29/22 with OB/GYN  Pregnancy :  (Status:  New goal.)  Long Term Goal Recommended adequate rest     Advised to follow routine screening guidelines as outlined by obstetrics provider    Provided patient with Pregnancy education via [place] upcoming appointments including:09/14/22 with MFM for OB Ultrasound, upcoming [place] appointment and 09/29/22 for OB visit   Health Maintenance  (Status: Goal Met.) Long Term Goal  Evaluation of current treatment plan related to Anxiety, Depression, and Health Maintenance , Limited access to food self-management and patient's adherence to plan as established by provider. Discussed plans with patient for ongoing care management follow up and provided patient with direct contact information for care management team Reviewed medications with patient and discussed the importance of taking all medications as directed;  Patient Goals/Self-Care Activities: Take medications as prescribed   Attend church or other social activities Work with the social worker to address care coordination needs and will continue to work with the clinical team to address health care and disease management related needs call 1-800-273-TALK (toll free, 24 hour hotline) go to [facility] [address], [place] [phone]) call 911 if experiencing a Mental Health or [place] new patient appointment with provider of choice

## 2022-09-02 NOTE — Patient Outreach (Signed)
Medicaid Managed Care   Nurse Care Manager Note  09/02/2022 Name:  Andrea Jenkins MRN:  [id number] DOB:  1996/02/13  Andrea Jenkins is an 26 y.o. year old female who is a primary patient of Patient, No Pcp Per.  The [facility] Managed Care Coordination team was consulted for assistance with:    Obstetrics healthcare management needs  [name] was given information about Medicaid Managed Care Coordination team services today. [place] Patient agreed to services and verbal consent obtained.  Engaged with patient by telephone for follow up visit in response to provider referral for case management and/or care coordination services.   Assessments/Interventions:  Review of past medical history, allergies, medications, health status, including review of consultants reports, laboratory and other test data, was performed as part of comprehensive evaluation and provision of chronic care management services.  SDOH (Social Determinants of Health) assessments and interventions performed: SDOH Interventions    Flowsheet Row Patient Outreach Telephone from 09/02/2022 in [place] Most recent reading at 09/02/2022 10:16 AM Patient Outreach Telephone from 07/02/2022 in [place] Most recent reading at 07/02/2022 10:29 AM Patient Outreach Telephone from 04/30/2022 in [place] Most recent reading at 04/30/2022 11:21 AM Patient Outreach Telephone from 12/25/2021 in [employer] Most recent reading at 12/26/2021  1:38 PM Telephone from 12/18/2021 in [employer] Most recent reading at 12/18/2021  3:51 PM Patient Outreach Telephone from 12/18/2021 in [employer] Most recent reading at 12/18/2021  8:56 AM  SDOH Interventions        Food Insecurity Interventions -- -- -- [id number]  Referral, Assist with [employer], Information systems manager ([facility] only), Other (Comment)  [place] Referral Other (Comment)  [Care Guide referral for food pantries]  Housing Interventions -- -- -- Intervention Not Indicated -- Intervention Not Indicated  Transportation Interventions Intervention Not Indicated -- Intervention Not Indicated Intervention Not Indicated -- Intervention Not Indicated  Utilities Interventions Intervention Not Indicated -- -- -- -- --  Depression Interventions/Treatment  -- Medication -- Patient refuses Treatment  [Only requesting list of resources] -- --  Financial Strain Interventions -- -- -- Other (Comment)  [Care Guide Assistance] -- --  Physical Activity Interventions -- -- -- Patient Refused -- --  Stress Interventions -- [employer], Provide Counseling -- [place], Other (Comment)  [Offered Counseling - Patient Refused] -- --  Social Connections Interventions -- -- -- Intervention Not Indicated -- --       Care Plan  Allergies  Allergen Reactions   Nortrel 1-35 (21) [Norethin-Eth Estrad Triphasic] Anaphylaxis   Peach [Prunus Persica] Hives and Itching   Rocephin [Ceftriaxone] Hives and Itching    Medications Reviewed Today     Reviewed by [name], RN (Registered Nurse) on 09/02/22 at [place] Status: <None>   Medication Order Taking? Sig Documenting Provider Last Dose Status Informant  acetaminophen (TYLENOL) 500 MG tablet [id number] Yes Take 500 mg by mouth every 6 (six) hours as needed. [provider] Taking Active Self  ARIPiprazole (ABILIFY) 5 MG tablet [id number] Yes Take 2 tablets (10 mg total) by mouth daily. Take 1 tablet for 3 days then increase to 2 days Freida Busman, MD Taking Active   capsaicin (ZOSTRIX) 0.025 % cream [id number] Yes Apply topically 2 (two) times daily. Deloris Ping,  CNM Taking Active   cyclobenzaprine  (FLEXERIL) 10 MG tablet [id number] No Take 1 tablet (10 mg total) by mouth 2 (two) times daily as needed for muscle spasms.  Patient not taking: Reported on 09/02/2022   Clarnce Flock, MD Not Taking Active   DICLEGIS 10-10 MG TBEC [id number] No 1 tab in AM, 1 tab mid afternoon 2 tabs at bedtime. Max dose 4 tabs daily.  Patient not taking: Reported on 08/17/2022   Constant, Peggy, MD Not Taking Active   EPINEPHrine (EPIPEN 2-PAK) 0.3 mg/0.3 mL IJ SOAJ injection [id number] No Inject 0.3 mg into the muscle as needed for anaphylaxis.  Patient not taking: Reported on 08/17/2022   [name], MD Not Taking Active   famotidine (PEPCID) 20 MG tablet [id number] No Take 1 tablet (20 mg total) by mouth 2 (two) times daily.  Patient not taking: Reported on 08/17/2022   [name], MD Not Taking Active   metroNIDAZOLE (METROGEL) 0.75 % vaginal gel [id number] No Place 1 Applicatorful vaginally at bedtime. Apply one applicatorful to vagina at bedtime for 5 days  Patient not taking: Reported on 08/17/2022   Clarnce Flock, MD Not Taking Active            Med Note (Alannah Averhart A   Thu Sep 02, 2022 10:20 AM) completing  ondansetron (ZOFRAN-ODT) 8 MG disintegrating tablet [id number] No Take 1 tablet (8 mg total) by mouth every 8 (eight) hours as needed for nausea or vomiting.  Patient not taking: Reported on 08/17/2022   Starr Lake, CNM Not Taking Active   Prenatal Vit-Fe Fumarate-FA (PREPLUS) 27-1 MG TABS [id number] Yes Take 1 tablet by mouth daily. 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   Supervision of other normal pregnancy, antepartum 08/04/2022   Gonorrhea contact 08/04/2022   Generalized anxiety disorder 03/26/2022   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   Bipolar disorder, current episode mixed, moderate ([place]) 03/26/2022   Fatigue 03/19/2022   Overweight (BMI  25.0-29.9) 03/19/2022   Depression    Menorrhagia    Allergic reaction to drug, initial encounter 03/06/2022   Anxiety    Alpha thalassemia silent carrier 10/24/2019    Conditions to be addressed/monitored per PCP order:   Obstetrical Health Management  Care Plan : RN Care Manager Plan of Care  Updates made by [name], RN since 09/02/2022 12:00 AM     Problem: Health Management Needs related to Health Maintenance, Anxiety and Depression      Long-Range Goal: Development of Plan of Care to address Health Management Needs related to Health Maintenance, Anxiety and Depression   Start Date: 12/18/2021  Expected End Date: 03/25/2023  Priority: High  Note:   Current Barriers:  Knowledge Deficits related to plan of care for management of Anxiety with Panic Symptoms, and Depression: depressed mood decreased appetite and Health Maintenance -Ms. Soller continues to have no nausea and vomiting. She is working 2nd shift. She had an OB visit yesterday and will have an ultrasound on 09/14/22. Ms. Noffsinger denies any needs at this time, but would like RNCM to continue to follow.     RNCM Clinical Goal(s):  Patient will verbalize understanding of plan for management of Anxiety, Depression, and Health Maintenance as evidenced by verbalization of self monitoring activities take all medications exactly as prescribed and will  call provider for medication related questions as evidenced by documentation in EMR    work with social worker to address [place] Concerns  related to the management of Anxiety and Depression as evidenced by review of EMR and patient or social worker report     through collaboration with Consulting civil engineer, provider, and care team. -Met Work with Care Guide for food insecurity and needing a dental provider-Met  Interventions: Inter-disciplinary care team collaboration (see longitudinal plan of care) Evaluation of current treatment plan related to  self management and  patient's adherence to plan as established by provider  Nausea and Vomiting:  (Status:  Goal Met.)  Long Term Goal Recommended adequate rest   Advised to follow routine screening guidelines as outlined by obstetrics provider   Advised patient to take all medications with her to upcoming OB visit on 08/04/22 Discuss any concerns or questions with provider Keep all appointments including 09/14/22 with MFM for OB Ultrasound and 09/29/22 with OB/GYN  Pregnancy :  (Status:  New goal.)  Long Term Goal Recommended adequate rest     Advised to follow routine screening guidelines as outlined by obstetrics provider    Provided patient with Pregnancy education via [place] upcoming appointments including:09/14/22 with MFM for OB Ultrasound, upcoming [place] appointment and 09/29/22 for OB visit   Health Maintenance  (Status: Goal Met.) Long Term Goal  Evaluation of current treatment plan related to Anxiety, Depression, and Health Maintenance , Limited access to food self-management and patient's adherence to plan as established by provider. Discussed plans with patient for ongoing care management follow up and provided patient with direct contact information for care management team Reviewed medications with patient and discussed the importance of taking all medications as directed;  Patient Goals/Self-Care Activities: Take medications as prescribed   Attend church or other social activities Work with the social worker to address care coordination needs and will continue to work with the clinical team to address health care and disease management related needs call 1-800-273-TALK (toll free, 24 hour hotline) go to [facility] [address], [place] [phone]) call 911 if experiencing a Mental Health or [place] new patient appointment with provider of choice       Follow Up:  Patient agrees to Care Plan and  Follow-up.  Plan: The Managed Medicaid care management team will reach out to the patient again over the next 30 days.  Date/time of next scheduled RN care management/care coordination outreach:  10/06/22 @ 10:30am  Lurena Joiner RN, BSN [place] RN Care Coordinator

## 2022-09-03 LAB — AFP, SERUM, OPEN SPINA BIFIDA
AFP MoM: 1.48
AFP Value: 61.2 ng/mL
Gest. Age on Collection Date: 17.2 weeks
Maternal Age At EDD: 25.3 yr
OSBR Risk 1 IN: 5772
Test Results:: NEGATIVE
Weight: 163 [lb_av]

## 2022-09-03 LAB — URINE CULTURE, OB REFLEX

## 2022-09-03 LAB — CULTURE, OB URINE

## 2022-09-10 ENCOUNTER — Telehealth (INDEPENDENT_AMBULATORY_CARE_PROVIDER_SITE_OTHER): Payer: Medicaid Other | Admitting: Student in an Organized Health Care Education/Training Program

## 2022-09-10 ENCOUNTER — Encounter (HOSPITAL_COMMUNITY): Payer: Self-pay | Admitting: Student in an Organized Health Care Education/Training Program

## 2022-09-10 DIAGNOSIS — F3162 Bipolar disorder, current episode mixed, moderate: Secondary | ICD-10-CM

## 2022-09-10 MED ORDER — ARIPIPRAZOLE 10 MG PO TABS: 10.0000 mg | ORAL_TABLET | Freq: Every day | ORAL | 2 refills | Status: DC
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
## 2022-09-14 ENCOUNTER — Ambulatory Visit: Payer: Medicaid Other | Admitting: *Deleted

## 2022-09-14 ENCOUNTER — Ambulatory Visit: Payer: Medicaid Other | Attending: Obstetrics and Gynecology

## 2022-09-14 VITALS — BP 116/53 | HR 71

## 2022-09-14 DIAGNOSIS — Z3A19 19 weeks gestation of pregnancy: Secondary | ICD-10-CM | POA: Diagnosis not present

## 2022-09-14 DIAGNOSIS — D563 Thalassemia minor: Secondary | ICD-10-CM | POA: Diagnosis not present

## 2022-09-14 DIAGNOSIS — Z363 Encounter for antenatal screening for malformations: Secondary | ICD-10-CM | POA: Insufficient documentation

## 2022-09-14 DIAGNOSIS — Z3480 Encounter for supervision of other normal pregnancy, unspecified trimester: Secondary | ICD-10-CM | POA: Diagnosis not present

## 2022-09-14 DIAGNOSIS — Z3689 Encounter for other specified antenatal screening: Secondary | ICD-10-CM | POA: Insufficient documentation

## 2022-09-21 ENCOUNTER — Encounter: Payer: Self-pay | Admitting: Obstetrics and Gynecology

## 2022-09-21 ENCOUNTER — Ambulatory Visit: Payer: Medicaid Other

## 2022-09-21 ENCOUNTER — Other Ambulatory Visit: Payer: Medicaid Other

## 2022-09-22 ENCOUNTER — Ambulatory Visit: Payer: Medicaid Other

## 2022-09-23 ENCOUNTER — Ambulatory Visit: Payer: Medicaid Other | Attending: Obstetrics

## 2022-09-23 ENCOUNTER — Other Ambulatory Visit: Payer: Self-pay

## 2022-09-29 ENCOUNTER — Ambulatory Visit (INDEPENDENT_AMBULATORY_CARE_PROVIDER_SITE_OTHER): Payer: Medicaid Other | Admitting: Obstetrics and Gynecology

## 2022-09-29 ENCOUNTER — Other Ambulatory Visit (HOSPITAL_COMMUNITY)
Admission: RE | Admit: 2022-09-29 | Discharge: 2022-09-29 | Disposition: A | Discharge status: Home / Self Care | Payer: Medicaid Other | Source: Ambulatory Visit | Attending: Obstetrics and Gynecology | Admitting: Obstetrics and Gynecology

## 2022-09-29 ENCOUNTER — Ambulatory Visit (INDEPENDENT_AMBULATORY_CARE_PROVIDER_SITE_OTHER): Payer: Medicaid Other | Admitting: Licensed Clinical Social Worker

## 2022-09-29 VITALS — BP 97/64 | HR 73 | Wt 175.0 lb

## 2022-09-29 DIAGNOSIS — F5089 Other specified eating disorder: Secondary | ICD-10-CM

## 2022-09-29 DIAGNOSIS — F3162 Bipolar disorder, current episode mixed, moderate: Secondary | ICD-10-CM

## 2022-09-29 DIAGNOSIS — Z3480 Encounter for supervision of other normal pregnancy, unspecified trimester: Secondary | ICD-10-CM | POA: Insufficient documentation

## 2022-09-29 DIAGNOSIS — Z3482 Encounter for supervision of other normal pregnancy, second trimester: Secondary | ICD-10-CM

## 2022-09-29 DIAGNOSIS — Z3A21 21 weeks gestation of pregnancy: Secondary | ICD-10-CM

## 2022-09-29 MED ORDER — ENSURE ACTIVE PO LIQD: 1.0000 | ORAL | 12 refills | Status: DC | PRN

## 2022-09-29 NOTE — Patient Instructions (Signed)

## 2022-09-29 NOTE — Progress Notes (Signed)
Pt states she feels she has a yeast infection, will do self swab today.

## 2022-09-29 NOTE — Progress Notes (Signed)
Subjective:  FRANCELLA BARNETT is a 26 y.o. G4P3003 at [redacted]w[redacted]d being seen today for ongoing prenatal care.  She is currently monitored for the following issues for this low-risk pregnancy and has Alpha thalassemia silent carrier; Anxiety; Depression; Fatigue; Overweight (BMI 25.0-29.9); Generalized anxiety disorder; Insomnia due to other mental disorder; Bipolar disorder, current episode mixed, moderate (HCC); Supervision of other normal pregnancy, antepartum; and Pica in adults on their problem list.  Patient reports vaginal irritation.  Contractions: Not present. Vag. Bleeding: None.  Movement: Absent. Denies leaking of fluid.   The following portions of the patient's history were reviewed and updated as appropriate: allergies, current medications, past family history, past medical history, past social history, past surgical history and problem list. Problem list updated.  Objective:   Vitals:   09/29/22 0840  BP: 97/64  Pulse: 73  Weight: 175 lb (79.4 kg)    Fetal Status: Fetal Heart Rate (bpm): 150   Movement: Absent     General:  Alert, oriented and cooperative. Patient is in no acute distress.  Skin: Skin is warm and dry. No rash noted.   Cardiovascular: Normal heart rate noted  Respiratory: Normal respiratory effort, no problems with respiration noted  Abdomen: Soft, gravid, appropriate for gestational age. Pain/Pressure: Absent     Pelvic:  Cervical exam deferred        Extremities: Normal range of motion.     Mental Status: Normal mood and affect. Normal behavior. Normal judgment and thought content.   Urinalysis:      Assessment and Plan:  Pregnancy: G4P3003 at [redacted]w[redacted]d  1. Supervision of other normal pregnancy, antepartum Stable Self swab today Glucola next visit Rx for Ensure for [facility] provided  Preterm labor symptoms and general obstetric precautions including but not limited to vaginal bleeding, contractions, leaking of fluid and fetal movement were reviewed in detail  with the patient. Please refer to After Visit Summary for other counseling recommendations.  Return in about 4 weeks (around 10/27/2022) for OB visit, face to face, any provider, fasting Glucola.   Hermina Staggers, MD
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
## 2022-09-30 ENCOUNTER — Encounter (HOSPITAL_COMMUNITY): Payer: Self-pay | Admitting: Student in an Organized Health Care Education/Training Program

## 2022-09-30 ENCOUNTER — Telehealth (INDEPENDENT_AMBULATORY_CARE_PROVIDER_SITE_OTHER): Payer: Medicaid Other | Admitting: Student in an Organized Health Care Education/Training Program

## 2022-09-30 DIAGNOSIS — F1010 Alcohol abuse, uncomplicated: Secondary | ICD-10-CM

## 2022-09-30 DIAGNOSIS — F3162 Bipolar disorder, current episode mixed, moderate: Secondary | ICD-10-CM

## 2022-09-30 DIAGNOSIS — O99311 Alcohol use complicating pregnancy, first trimester: Secondary | ICD-10-CM

## 2022-09-30 DIAGNOSIS — O99341 Other mental disorders complicating pregnancy, first trimester: Secondary | ICD-10-CM | POA: Diagnosis not present

## 2022-09-30 DIAGNOSIS — Z3A22 22 weeks gestation of pregnancy: Secondary | ICD-10-CM

## 2022-09-30 LAB — CERVICOVAGINAL ANCILLARY ONLY
Bacterial Vaginitis (gardnerella): NEGATIVE
Chlamydia: NEGATIVE
Comment: NEGATIVE
Comment: NEGATIVE
Comment: NEGATIVE
Comment: NORMAL
Neisseria Gonorrhea: NEGATIVE
Trichomonas: NEGATIVE

## 2022-09-30 MED ORDER — GABAPENTIN 100 MG PO CAPS: 100.0000 mg | ORAL_CAPSULE | Freq: Three times a day (TID) | ORAL | 2 refills | Status: DC

## 2022-09-30 MED ORDER — ARIPIPRAZOLE 10 MG PO TABS: 10.0000 mg | ORAL_TABLET | Freq: Every day | ORAL | 2 refills | Status: DC
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
## 2022-10-04 ENCOUNTER — Telehealth (HOSPITAL_COMMUNITY): Payer: Self-pay | Admitting: Professional

## 2022-10-04 ENCOUNTER — Ambulatory Visit (HOSPITAL_COMMUNITY): Payer: Medicaid Other

## 2022-10-04 ENCOUNTER — Encounter (HOSPITAL_COMMUNITY): Payer: Self-pay

## 2022-10-06 ENCOUNTER — Ambulatory Visit (HOSPITAL_COMMUNITY): Payer: Medicaid Other | Admitting: Professional

## 2022-10-06 ENCOUNTER — Encounter: Payer: Self-pay | Admitting: *Deleted

## 2022-10-06 ENCOUNTER — Other Ambulatory Visit: Payer: Medicaid Other | Admitting: *Deleted

## 2022-10-06 DIAGNOSIS — F314 Bipolar disorder, current episode depressed, severe, without psychotic features: Secondary | ICD-10-CM

## 2022-10-06 DIAGNOSIS — F419 Anxiety disorder, unspecified: Secondary | ICD-10-CM
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
## 2022-10-06 NOTE — Patient Outreach (Addendum)
Medicaid Managed Care   Nurse Care Manager Note  10/06/2022 Name:  Andrea Jenkins MRN:  [id number] DOB:  [birth year]  Andrea Jenkins is an 26 y.o. year old female who is a primary patient of Andrea Contes, MD.  The Medicaid Managed Care Coordination team was consulted for assistance with:    Obstetrics healthcare management needs  Andrea Jenkins was given information about Medicaid Managed Care Coordination team services today. [name] Patient agreed to services and verbal consent obtained.  Engaged with patient by telephone for follow up visit in response to provider referral for case management and/or care coordination services.   Assessments/Interventions:  Review of past medical history, allergies, medications, health status, including review of consultants reports, laboratory and other test data, was performed as part of comprehensive evaluation and provision of chronic care management services.  SDOH (Social Determinants of Health) assessments and interventions performed: SDOH Interventions    Flowsheet Row Patient Outreach Telephone from 10/06/2022 in [place] Counselor from 09/29/2022 in [facility] Patient Outreach Telephone from 09/02/2022 in [place] Counselor from 08/31/2022 in [facility] Patient Outreach Telephone from 07/02/2022 in [place] Patient Outreach Telephone from 04/30/2022 in [place] Interventions        Housing Interventions Intervention Not Indicated -- -- -- -- --  Transportation Interventions Intervention Not Indicated -- Intervention Not Indicated -- -- Intervention Not Indicated  Utilities Interventions -- -- Intervention Not Indicated -- -- --  Depression Interventions/Treatment  -- Currently on Treatment -- Counseling Medication --   Stress Interventions -- -- -- -- [employer], Provide Counseling --       Care Plan  Allergies  Allergen Reactions   Nortrel 1-35 (21) [Norethin-Eth Estrad Triphasic] Anaphylaxis   Peach [Prunus Persica] Hives and Itching   Rocephin [Ceftriaxone] Hives and Itching    Medications Reviewed Today     Reviewed by [name], RN (Registered Nurse) on 10/06/22 at [place] List Status: <None>   Medication Order Taking? Sig Documenting Provider Last Dose Status Informant  acetaminophen (TYLENOL) 500 MG tablet [id number] Yes Take 500 mg by mouth every 6 (six) hours as needed. [provider] Taking Active Self  ARIPiprazole (ABILIFY) 10 MG tablet [id number] Yes Take 1 tablet (10 mg total) by mouth daily. Freida Busman, MD Taking Active   capsaicin (ZOSTRIX) 0.025 % cream [id number] No Apply topically 2 (two) times daily.  Patient not taking: Reported on 10/06/2022   Deloris Ping, CNM Not Taking Active   EPINEPHrine (EPIPEN 2-PAK) 0.3 mg/0.3 mL IJ SOAJ injection [id number] Yes Inject 0.3 mg into the muscle as needed for anaphylaxis. [name], MD Taking Active   gabapentin (NEURONTIN) 100 MG capsule [id number] No Take 1 capsule (100 mg total) by mouth 3 (three) times daily.  Patient not taking: Reported on 10/06/2022   Freida Busman, MD Not Taking Active            Med Note Andrea Jenkins, Andrea Jenkins A   Wed Oct 06, 2022 10:38 AM) Needs to pick up from pharmacy  Nutritional Supplements ([place]) LIQD [id number] No Take 1 Can by mouth as needed.  Patient not taking: Reported on 10/06/2022   [name], MD Not Taking Active   Prenatal Vit-Fe Fumarate-FA (PREPLUS) 27-1 MG TABS [id number] Yes Take 1 tablet by mouth daily. 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   Supervision of other normal pregnancy, antepartum 08/04/2022   Generalized anxiety  disorder 03/26/2022   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   Bipolar disorder, current episode mixed, moderate ([place]) 03/26/2022   Fatigue 03/19/2022   Overweight (BMI 25.0-29.9) 03/19/2022   Depression    Anxiety    Alpha thalassemia silent carrier 10/24/2019    Conditions to be addressed/monitored per PCP order:   obstetrics health management  Care Plan : RN Care Manager Plan of Care  Updates made by [name], RN since 10/06/2022 12:00 AM     Problem: Health Management Needs related to Health Maintenance, Anxiety and Depression      Long-Range Goal: Development of Plan of Care to address Health Management Needs related to Health Maintenance, Anxiety and Depression   Start Date: 12/18/2021  Expected End Date: 03/25/2023  Priority: High  Note:   Current Barriers:  Knowledge Deficits related to plan of care for management of Anxiety with Panic Symptoms, and Depression: depressed mood decreased appetite and Health Maintenance -[name] is not longer working, due to lack of child care. She is attending OB visits and monthly behavioral health visits. [name] denies any needs at this time.     RNCM Clinical Goal(s):  Patient will verbalize understanding of plan for management of Anxiety, Depression, and Health Maintenance as evidenced by verbalization of self monitoring activities take all medications exactly as prescribed and will call provider for medication related questions as evidenced by documentation in EMR    work with social worker to address [place] Concerns  related to the management of Anxiety and Depression as evidenced by review of EMR and patient or social worker report     through collaboration with Consulting civil engineer, provider, and care team. -Met Work with Care Guide for food insecurity and needing a dental provider-Met  Interventions: Inter-disciplinary care team collaboration (see longitudinal plan of care) Evaluation of current treatment  plan related to  self management and patient's adherence to plan as established by provider   Pregnancy :  (Status:  Goal on track:  Yes.)  Long Term Goal Recommended adequate rest     Advised to follow routine screening guidelines as outlined by obstetrics provider    Provided patient with Pregnancy education via [place] upcoming appointments including:10/28/22 in person OB visit and monthly [place] appointment  Advised to contact [facility] provider with concerns or questions Assessed for fetal movement Reviewed medications, advised patient to make OB provider aware if she starts taking gabapentin, new medication prescribed by [place] patient to contact [employer] member services [phone], for pregnancy benefits    Patient Goals/Self-Care Activities: Take medications as prescribed   Attend church or other social activities Work with the Education officer, museum to address care coordination needs and will continue to work with the clinical team to address health care and disease management related needs call 1-800-273-TALK (toll free, 24 hour hotline) go to [facility] [address], [place] [phone]) call 911 if experiencing a Mental Health or [place]  Schedule new patient appointment with provider of choice       Follow Up:  Patient agrees to Care Plan and Follow-up.  Plan: The Managed Medicaid care management team will reach out to the patient again  over the next 30 days.  Date/time of next scheduled RN care management/care coordination outreach:  11/05/22 @ 10:30am  Lurena Joiner RN, BSN [place] RN Care Coordinator

## 2022-10-06 NOTE — Patient Instructions (Addendum)
Visit Information  Ms. Altemose was given information about Medicaid Managed Care team care coordination services as a part of their Healthy [facility] Medicaid benefit. Jerlyn Ly Agosto verbally consented to engagement with the [facility] Managed Care team.   If you are experiencing a medical emergency, please call 911 or report to your local emergency department or urgent care.   If you have a non-emergency medical problem during routine business hours, please contact your provider's office and ask to speak with a nurse.   For questions related to your Healthy [facility] health plan, please call: [phone] or visit the homepage here: [email or web address]  If you would like to schedule transportation through your Healthy [facility] plan, please call the following number at least 2 days in advance of your appointment: [phone]  For information about your ride after you set it up, call Ride Assist at [phone]. Use this number to activate a Will Call pickup, or if your transportation is late for a scheduled pickup. Use this number, too, if you need to make a change or cancel a previously scheduled reservation.  If you need transportation services right away, call [phone]. The after-hours call center is staffed 24 hours to handle ride assistance and urgent reservation requests (including discharges) 365 days a year. Urgent trips include sick visits, hospital discharge requests and life-sustaining treatment.  Call the [place] at [phone], at any time, 24 hours a day, 7 days a week. If you are in danger or need immediate medical attention call 911.  If you would like help to quit smoking, call 1-800-QUIT-NOW 204-824-5220) OR Espaol: 1-855-Djelo-Ya (8-182-993-7169) o para ms informacin haga clic aqu or Text READY to 200-400 to register via text  Ms. Lavalle,   Please see education materials related to pregnancy  provided by MyChart link.  Patient verbalizes understanding of instructions and care plan provided today and agrees to view in [place]. Active MyChart status and patient understanding of how to access instructions and care plan via MyChart confirmed with patient.     Telephone follow up appointment with Managed Medicaid care management team member scheduled for:11/05/22 @ 10:30am  Lurena Joiner RN, BSN [place] RN Care Coordinator   Following is a copy of your plan of care:  Care Plan : RN Care Manager Plan of Care  Updates made by [name], RN since 10/06/2022 12:00 AM     Problem: Health Management Needs related to Health Maintenance, Anxiety and Depression      Long-Range Goal: Development of Plan of Care to address Health Management Needs related to Health Maintenance, Anxiety and Depression   Start Date: 12/18/2021  Expected End Date: 03/25/2023  Priority: High  Note:   Current Barriers:  Knowledge Deficits related to plan of care for management of Anxiety with Panic Symptoms, and Depression: depressed mood decreased appetite and Health Maintenance -Ms. Hobbs is not longer working, due to lack of child care. She is attending OB visits and monthly behavioral health visits. Ms. Ulrich denies any needs at this time.     RNCM Clinical Goal(s):  Patient will verbalize understanding of plan for management of Anxiety, Depression, and Health Maintenance as evidenced by verbalization of self monitoring activities take all medications exactly as prescribed and will call provider for medication related questions as evidenced by documentation in EMR    work with social worker to address [place] Concerns  related to the management of Anxiety and Depression as evidenced by review  of EMR and patient or social worker report     through collaboration with Consulting civil engineer, provider, and care team. -Met Work with Care Guide for food insecurity and needing a  dental provider-Met  Interventions: Inter-disciplinary care team collaboration (see longitudinal plan of care) Evaluation of current treatment plan related to  self management and patient's adherence to plan as established by provider   Pregnancy :  (Status:  Goal on track:  Yes.)  Long Term Goal Recommended adequate rest     Advised to follow routine screening guidelines as outlined by obstetrics provider    Provided patient with Pregnancy education via [place] upcoming appointments including:10/28/22 in person OB visit and monthly [place] appointment  Advised to contact [facility] provider with concerns or questions Assessed for fetal movement Reviewed medications, advised patient to make OB provider aware if she starts taking gabapentin, new medication prescribed by [place] patient to contact [employer] member services [phone], for pregnancy benefits    Patient Goals/Self-Care Activities: Take medications as prescribed   Attend church or other social activities Work with the Education officer, museum to address care coordination needs and will continue to work with the clinical team to address health care and disease management related needs call 1-800-273-TALK (toll free, 24 hour hotline) go to [facility] [address], [place] [phone]) call 911 if experiencing a Mental Health or [place]  Schedule new patient appointment with provider of choice

## 2022-10-11 ENCOUNTER — Encounter (HOSPITAL_COMMUNITY): Payer: Self-pay

## 2022-10-11 ENCOUNTER — Telehealth (HOSPITAL_COMMUNITY): Payer: Self-pay | Admitting: Professional

## 2022-10-11 ENCOUNTER — Ambulatory Visit (HOSPITAL_COMMUNITY): Payer: Medicaid Other

## 2022-10-12 ENCOUNTER — Ambulatory Visit (HOSPITAL_COMMUNITY): Payer: Medicaid Other

## 2022-10-13 ENCOUNTER — Ambulatory Visit (HOSPITAL_COMMUNITY): Payer: Medicaid Other

## 2022-10-14 ENCOUNTER — Ambulatory Visit (HOSPITAL_COMMUNITY): Payer: Medicaid Other | Admitting: Licensed Clinical Social Worker

## 2022-10-14 ENCOUNTER — Ambulatory Visit (HOSPITAL_COMMUNITY): Payer: Medicaid Other

## 2022-10-14 ENCOUNTER — Encounter (HOSPITAL_COMMUNITY): Payer: Self-pay

## 2022-10-15 ENCOUNTER — Ambulatory Visit (HOSPITAL_COMMUNITY): Payer: Medicaid Other

## 2022-10-24 ENCOUNTER — Encounter (HOSPITAL_COMMUNITY): Payer: Self-pay | Admitting: Obstetrics and Gynecology

## 2022-10-24 ENCOUNTER — Inpatient Hospital Stay (HOSPITAL_COMMUNITY)
Admission: AD | Admit: 2022-10-24 | Discharge: 2022-10-24 | Disposition: A | Discharge status: Home / Self Care | Payer: Medicaid Other | Attending: Obstetrics and Gynecology | Admitting: Obstetrics and Gynecology

## 2022-10-24 DIAGNOSIS — O368120 Decreased fetal movements, second trimester, not applicable or unspecified: Secondary | ICD-10-CM | POA: Insufficient documentation

## 2022-10-24 DIAGNOSIS — Z87891 Personal history of nicotine dependence: Secondary | ICD-10-CM | POA: Insufficient documentation

## 2022-10-24 DIAGNOSIS — Z3A25 25 weeks gestation of pregnancy: Secondary | ICD-10-CM | POA: Insufficient documentation

## 2022-10-24 DIAGNOSIS — R519 Headache, unspecified: Secondary | ICD-10-CM | POA: Diagnosis not present

## 2022-10-24 DIAGNOSIS — Z3A24 24 weeks gestation of pregnancy: Secondary | ICD-10-CM

## 2022-10-24 DIAGNOSIS — R109 Unspecified abdominal pain: Secondary | ICD-10-CM | POA: Insufficient documentation

## 2022-10-24 DIAGNOSIS — O26892 Other specified pregnancy related conditions, second trimester: Secondary | ICD-10-CM | POA: Diagnosis present

## 2022-10-24 DIAGNOSIS — O2342 Unspecified infection of urinary tract in pregnancy, second trimester: Secondary | ICD-10-CM

## 2022-10-24 DIAGNOSIS — Z3689 Encounter for other specified antenatal screening: Secondary | ICD-10-CM

## 2022-10-24 LAB — URINALYSIS, ROUTINE W REFLEX MICROSCOPIC
Bilirubin Urine: NEGATIVE
Glucose, UA: NEGATIVE mg/dL
Hgb urine dipstick: NEGATIVE
Ketones, ur: NEGATIVE mg/dL
Nitrite: NEGATIVE
Protein, ur: NEGATIVE mg/dL
Specific Gravity, Urine: 1.003 — ABNORMAL LOW (ref 1.005–1.030)
pH: 7 (ref 5.0–8.0)

## 2022-10-24 MED ORDER — CYCLOBENZAPRINE HCL 10 MG PO TABS: 10.0000 mg | ORAL_TABLET | Freq: Two times a day (BID) | ORAL | 0 refills | Status: DC | PRN

## 2022-10-24 MED ORDER — NITROFURANTOIN MONOHYD MACRO 100 MG PO CAPS: 100.0000 mg | ORAL_CAPSULE | Freq: Two times a day (BID) | ORAL | 0 refills | Status: DC

## 2022-10-24 MED ORDER — ACETAMINOPHEN-CAFFEINE 500-65 MG PO TABS
2.0000 | ORAL_TABLET | Freq: Once | ORAL | Status: AC
  Administered 2022-10-24: 2 via ORAL
  Filled 2022-10-24: qty 2

## 2022-10-24 MED ORDER — ACETAMINOPHEN 325 MG PO TABS: 650.0000 mg | ORAL_TABLET | ORAL | 0 refills | Status: DC | PRN

## 2022-10-24 MED ORDER — CETIRIZINE HCL 10 MG PO TABS: 10.0000 mg | ORAL_TABLET | Freq: Every day | ORAL | 0 refills | Status: DC

## 2022-10-24 NOTE — Discharge Instructions (Signed)
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
## 2022-10-24 NOTE — MAU Note (Signed)
.  Andrea Jenkins is a 26 y.o. at [redacted]w[redacted]d here in MAU reporting: DFM for the past two days. She felt less movement yesterday and no movement today. She has also had a HA that started earlier today 7/10. Has not taken any medications for the HA. Also reporting lower abdominal pressure (7/10) that goes down to the top of her thighs. She denies VB or LOF. LMP: N/A Onset of complaint: Past two days Pain score: 7/10    FHT:150 Lab orders placed from triage:  UA

## 2022-10-25 NOTE — L&D Delivery Note (Signed)
OB/GYN Faculty Practice Delivery Note  Andrea Jenkins is a 27 y.o. [phone] s/p vag del at [redacted]w[redacted]d. She was admitted for IOL due to ICP.   ROM: 0h 27m with clear fluid GBS Status: neg Maximum Maternal Temperature: 98.5  Labor Progress: [name] was admitted for ICP as diagnosed by BA of 11.3 on 01/25/23; she had been having prodromal labor with no change past 4cm, so upon arrival she was in agreement with AROM. She progressed to vag delivery 40 mins afterwards.  Delivery Date/Time: April 4th, 2024 at 0027 Delivery: Called to room and patient was complete and pushing. Head delivered LOA. No nuchal cord present. Shoulder and body delivered in usual fashion. Infant with spontaneous cry, placed on mother's abdomen, dried and stimulated. Cord clamped x 2 after 1-minute delay, and cut by friend of patient. Cord blood drawn. Placenta delivered spontaneously with gentle cord traction. Fundus firm with massage and Pitocin. Labia, perineum, vagina, and cervix inspected and found tod be intact.   Placenta: spont, intact; to L&D Complications: none Lacerations: none EBL: 173cc Analgesia: none (rec'd a dose of Fentanyl at 0000)  Postpartum Planning [x]  message to sent to schedule follow-up   Infant: girl  APGARs 9/10  3380g (7lb 7.2oz)  Myrtis Ser, CNM  01/27/2023 12:48 AM

## 2022-10-26 LAB — CULTURE, OB URINE: Culture: >=100000 — AB

## 2022-10-27 ENCOUNTER — Ambulatory Visit (INDEPENDENT_AMBULATORY_CARE_PROVIDER_SITE_OTHER): Payer: Medicaid Other

## 2022-10-27 ENCOUNTER — Other Ambulatory Visit (HOSPITAL_COMMUNITY)
Admission: RE | Admit: 2022-10-27 | Discharge: 2022-10-27 | Disposition: A | Discharge status: Home / Self Care | Payer: Medicaid Other | Source: Ambulatory Visit | Attending: Obstetrics and Gynecology | Admitting: Obstetrics and Gynecology

## 2022-10-27 DIAGNOSIS — N898 Other specified noninflammatory disorders of vagina: Secondary | ICD-10-CM | POA: Insufficient documentation

## 2022-10-27 NOTE — Progress Notes (Signed)
..  SUBJECTIVE:  27 y.o. female complains of vaginal irritation for 2 day(s). Pt reports having a non-tender "bump" inside of her vagina  Denies abnormal vaginal bleeding or significant pelvic pain or fever. No UTI symptoms. Denies history of known exposure to STD.  Patient's last menstrual period was 05/03/2022 (approximate).  OBJECTIVE:  She appears well, afebrile. Urine dipstick: not done.  ASSESSMENT:  Vaginal irritation  Vaginal bump   PLAN:  GC, chlamydia, trichomonas, BVAG, CVAG probe sent to lab. Treatment: To be determined once lab results are received ROV prn if symptoms persist or worsen. Pt has MD appt tomorrow and will be further evaluated then.

## 2022-10-28 ENCOUNTER — Encounter: Payer: Self-pay | Admitting: Obstetrics and Gynecology

## 2022-10-28 ENCOUNTER — Ambulatory Visit (INDEPENDENT_AMBULATORY_CARE_PROVIDER_SITE_OTHER): Payer: Medicaid Other | Admitting: Obstetrics and Gynecology

## 2022-10-28 ENCOUNTER — Other Ambulatory Visit: Payer: Medicaid Other

## 2022-10-28 VITALS — BP 124/69 | HR 85 | Wt 179.0 lb

## 2022-10-28 DIAGNOSIS — Z3480 Encounter for supervision of other normal pregnancy, unspecified trimester: Secondary | ICD-10-CM

## 2022-10-28 DIAGNOSIS — Z3482 Encounter for supervision of other normal pregnancy, second trimester: Secondary | ICD-10-CM

## 2022-10-28 DIAGNOSIS — Z23 Encounter for immunization: Secondary | ICD-10-CM | POA: Diagnosis not present

## 2022-10-28 DIAGNOSIS — Z3A25 25 weeks gestation of pregnancy: Secondary | ICD-10-CM

## 2022-10-28 LAB — CERVICOVAGINAL ANCILLARY ONLY
Bacterial Vaginitis (gardnerella): NEGATIVE
Candida Glabrata: NEGATIVE
Candida Vaginitis: NEGATIVE
Chlamydia: NEGATIVE
Comment: NEGATIVE
Comment: NEGATIVE
Comment: NEGATIVE
Comment: NEGATIVE
Comment: NEGATIVE
Comment: NORMAL
Neisseria Gonorrhea: NEGATIVE
Trichomonas: NEGATIVE

## 2022-10-28 NOTE — Patient Instructions (Signed)

## 2022-10-28 NOTE — Progress Notes (Addendum)
ROB/GTT.  TDAp given in RD, tolerated well.  C/o pelvic pressure on her left side.

## 2022-10-28 NOTE — Progress Notes (Signed)
Subjective:  Andrea Jenkins is a 27 y.o. [phone] at [redacted]w[redacted]d being seen today for ongoing prenatal care.  She is currently monitored for the following issues for this low-risk pregnancy and has Alpha thalassemia silent carrier; Anxiety; Bipolar disorder with severe depression ([place]); Fatigue; Overweight (BMI 25.0-29.9); Generalized anxiety disorder; Insomnia due to other mental disorder; Bipolar disorder, current episode mixed, moderate ([place]); Supervision of other normal pregnancy, antepartum; and Pica in adults on their problem list.  Patient reports  labial "bump" .  Contractions: Not present. Vag. Bleeding: None.  Movement: Present. Denies leaking of fluid.   The following portions of the patient's history were reviewed and updated as appropriate: allergies, current medications, past family history, past medical history, past social history, past surgical history and problem list. Problem list updated.  Objective:   Vitals:   10/28/22 0851  BP: 124/69  Pulse: 85  Weight: 179 lb (81.2 kg)    Fetal Status: Fetal Heart Rate (bpm): 145   Movement: Present     General:  Alert, oriented and cooperative. Patient is in no acute distress.  Skin: Skin is warm and dry. No rash noted.   Cardiovascular: Normal heart rate noted  Respiratory: Normal respiratory effort, no problems with respiration noted  Abdomen: Soft, gravid, appropriate for gestational age. Pain/Pressure: Present     Pelvic:   Small right labial inclusion cyst          Extremities: Normal range of motion.  Edema: None  Mental Status: Normal mood and affect. Normal behavior. Normal judgment and thought content.   Urinalysis:      Assessment and Plan:  Pregnancy: [id number] at [redacted]w[redacted]d  1. Supervision of other normal pregnancy, antepartum Stable 28 week labs today  Preterm labor symptoms and general obstetric precautions including but not limited to vaginal bleeding, contractions, leaking of fluid and fetal movement were reviewed in  detail with the patient. Please refer to After Visit Summary for other counseling recommendations.  Return in about 3 weeks (around 11/18/2022) for face to face, any provider, OB visit.   [name], MD

## 2022-10-29 ENCOUNTER — Telehealth (INDEPENDENT_AMBULATORY_CARE_PROVIDER_SITE_OTHER): Payer: Medicaid Other | Admitting: Student in an Organized Health Care Education/Training Program

## 2022-10-29 ENCOUNTER — Encounter (HOSPITAL_COMMUNITY): Payer: Self-pay | Admitting: Student in an Organized Health Care Education/Training Program

## 2022-10-29 DIAGNOSIS — F3162 Bipolar disorder, current episode mixed, moderate: Secondary | ICD-10-CM | POA: Diagnosis not present

## 2022-10-29 LAB — CBC
Hematocrit: 34.7 % (ref 34.0–46.6)
Hemoglobin: 11.8 g/dL (ref 11.1–15.9)
MCH: 28 pg (ref 26.6–33.0)
MCHC: 34 g/dL (ref 31.5–35.7)
MCV: 82 fL (ref 79–97)
Platelets: 171 10*3/uL (ref 150–450)
RBC: 4.21 x10E6/uL (ref 3.77–5.28)
RDW: 12 % (ref 11.7–15.4)
WBC: 4.4 10*3/uL (ref 3.4–10.8)

## 2022-10-29 LAB — GLUCOSE TOLERANCE, 2 HOURS W/ 1HR
Glucose, 1 hour: 86 mg/dL (ref 70–179)
Glucose, 2 hour: 65 mg/dL — ABNORMAL LOW (ref 70–152)
Glucose, Fasting: 68 mg/dL — ABNORMAL LOW (ref 70–91)

## 2022-10-29 LAB — RPR: RPR Ser Ql: NONREACTIVE

## 2022-10-29 LAB — HIV ANTIBODY (ROUTINE TESTING W REFLEX): HIV Screen 4th Generation wRfx: NONREACTIVE

## 2022-10-29 MED ORDER — ARIPIPRAZOLE 10 MG PO TABS: 10.0000 mg | ORAL_TABLET | Freq: Every day | ORAL | 2 refills | Status: DC
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
## 2022-11-05 ENCOUNTER — Other Ambulatory Visit: Payer: Medicaid Other | Admitting: *Deleted

## 2022-11-05 ENCOUNTER — Encounter: Payer: Self-pay | Admitting: *Deleted

## 2022-11-05 NOTE — Patient Outreach (Signed)
Medicaid Managed Care   Nurse Care Manager Note  11/05/2022 Name:  Andrea Jenkins MRN:  [id number] DOB:  04/05/1996  Andrea Jenkins is an 27 y.o. year old female who is a primary patient of Earl Lagos, MD.  The Medicaid Managed Care Coordination team was consulted for assistance with:    Obstetrics healthcare management needs  Ms. Round was given information about Medicaid Managed Care Coordination team services today. Andrea Jenkins Patient agreed to services and verbal consent obtained.  Engaged with patient by telephone for follow up visit in response to provider referral for case management and/or care coordination services.   Assessments/Interventions:  Review of past medical history, allergies, medications, health status, including review of consultants reports, laboratory and other test data, was performed as part of comprehensive evaluation and provision of chronic care management services.  SDOH (Social Determinants of Health) assessments and interventions performed: SDOH Interventions    Flowsheet Row Patient Outreach Telephone from 11/05/2022 in [place] POPULATION HEALTH DEPARTMENT Patient Outreach Telephone from 10/06/2022 in [place] POPULATION HEALTH DEPARTMENT Patient Outreach Telephone from 09/02/2022 in [place] POPULATION HEALTH DEPARTMENT Patient Outreach Telephone from 07/02/2022 in [facility] Care Coordination Patient Outreach Telephone from 04/30/2022 in [facility] Care Coordination Patient Outreach Telephone from 12/25/2021 in [facility] Care Coordination  SDOH Interventions        Food Insecurity Interventions Intervention Not Indicated -- -- -- -- [id number] Referral, Assist with [employer], Stage manager ([facility]), Other (Comment)  [Community Care Guide Assistance]  Housing Interventions Other (Comment)  [BSW referral] Intervention Not Indicated -- -- --  Intervention Not Indicated  Transportation Interventions -- Intervention Not Indicated Intervention Not Indicated -- Intervention Not Indicated Intervention Not Indicated  Utilities Interventions Intervention Not Indicated -- Intervention Not Indicated -- -- --  Depression Interventions/Treatment  -- -- -- Medication -- Patient refuses Treatment  [Only requesting list of resources]  Financial Strain Interventions -- -- -- -- -- Other (Comment)  [Care Guide Assistance]  Physical Activity Interventions -- -- -- -- -- Patient Refused  Stress Interventions -- -- -- [employer], Provide Counseling -- Offered [employer], Other (Comment)  [Offered Counseling - Patient Refused]  Social Connections Interventions -- -- -- -- -- Intervention Not Indicated       Care Plan  Allergies  Allergen Reactions   Nortrel 1-35 (21) [Norethin-Eth Estrad Triphasic] Anaphylaxis   Peach [Prunus Persica] Hives and Itching   Rocephin [Ceftriaxone] Hives and Itching    Medications Reviewed Today     Reviewed by Heidi Dach, RN (Registered Nurse) on 11/05/22 at 1006  Med List Status: <None>   Medication Order Taking? Sig Documenting Provider Last Dose Status Informant  acetaminophen (TYLENOL) 325 MG tablet [id number] Yes Take 2 tablets (650 mg total) by mouth every 4 (four) hours as needed for moderate pain or headache. Calvert Cantor, CNM Taking Active   ARIPiprazole (ABILIFY) 10 MG tablet [id number] Yes Take 1 tablet (10 mg total) by mouth daily. Bobbye Morton, MD Taking Active   cetirizine (ZYRTEC ALLERGY) 10 MG tablet [id number] Yes Take 1 tablet (10 mg total) by mouth daily. Calvert Cantor, CNM Taking Active   cyclobenzaprine (FLEXERIL) 10 MG tablet [id number] Yes Take 1 tablet (10 mg total) by mouth 2 (two) times daily as needed for muscle spasms. Clayton Bibles C, CNM Taking Active   EPINEPHrine (EPIPEN 2-PAK) 0.3 mg/0.3 mL IJ SOAJ  injection  [id number]  Inject 0.3 mg into the muscle as needed for anaphylaxis. [name], MD  Active   nitrofurantoin, macrocrystal-monohydrate, (MACROBID) 100 MG capsule [id number] No Take 1 capsule (100 mg total) by mouth 2 (two) times daily.  Patient not taking: Reported on 11/05/2022   [name], CNM Not Taking Active            Med Note (Gwyneth Fernandez A   Fri Nov 05, 2022 10:05 AM) Has not started, will start taking today  Nutritional Supplements [facility] ACTIVE) LIQD [id number] No Take 1 Can by mouth as needed.  Patient not taking: Reported on 10/06/2022   [name], MD Not Taking Active   Prenatal Vit-Fe Fumarate-FA (PREPLUS) 27-1 MG TABS [id number] Yes Take 1 tablet by mouth daily. 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   Supervision of other normal pregnancy, antepartum 08/04/2022   Generalized anxiety disorder 03/26/2022   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   Bipolar disorder, current episode mixed, moderate ([place]) 03/26/2022   Fatigue 03/19/2022   Overweight (BMI 25.0-29.9) 03/19/2022   Bipolar disorder with severe depression ([place])    Anxiety    Alpha thalassemia silent carrier 10/24/2019    Conditions to be addressed/monitored per PCP order:   Obstetrical Health Management  Care Plan : RN Care Manager Plan of Care  Updates made by Andrea Montane, RN since 11/05/2022 12:00 AM     Problem: Health Management Needs related to Health Maintenance, Anxiety and Depression      Long-Range Goal: Development of Plan of Care to address Health Management Needs related to Health Maintenance, Anxiety and Depression   Start Date: 12/18/2021  Expected End Date: 03/25/2023  Priority: High  Note:   Current Barriers:  Knowledge Deficits related to plan of care for management of Anxiety with Panic Symptoms, and Depression: depressed mood decreased appetite and Health  Maintenance -Ms. Hanrahan had recent MAU visit for decreased fetal movement. She is attending OB visits and monthly behavioral health visits. Ms. Sparlin would like information on housing resources.     RNCM Clinical Goal(s):  Patient will verbalize understanding of plan for management of Anxiety, Depression, and Health Maintenance as evidenced by verbalization of self monitoring activities take all medications exactly as prescribed and will call provider for medication related questions as evidenced by documentation in EMR    work with social worker to address [place] Concerns  related to the management of Anxiety and Depression as evidenced by review of EMR and patient or social worker report     through collaboration with Consulting civil engineer, provider, and care team. -Met Work with Care Guide for food insecurity and needing a dental provider-Met  Interventions: Inter-disciplinary care team collaboration (see longitudinal plan of care) Evaluation of current treatment plan related to  self management and patient's adherence to plan as established by provider   Pregnancy :  (Status:  Goal on track:  Yes.)  Long Term Goal Recommended adequate rest     Advised to follow routine screening guidelines as outlined by obstetrics provider    Provided patient with Pregnancy education via [place] upcoming appointments including:11/18/22 in person OB visit and monthly [place] appointment  Advised to contact [facility] provider with concerns or questions Assessed for fetal movement Advised patient to contact [employer] member services  [phone], for pregnancy benefits  Reviewed medications, advised patient to take antibiotic as directed Reviewed recent MAU visit, educated on fetal kick counts Referral to BSW for housing resources, patient is temporarily living with family, scheduled on 11/09/22   Patient Goals/Self-Care Activities: Take medications as prescribed   Attend church or other social  activities Work with the Education officer, museum to address care coordination needs and will continue to work with the clinical team to address health care and disease management related needs call [phone]-TALK (toll free, 24 hour hotline) go to [facility] [address] [place] [phone]) call 911 if experiencing a Mental Health or [place] new patient appointment with provider of choice       Follow Up:  Patient agrees to Care Plan and Follow-up.  Plan: The Managed Medicaid care management team will reach out to the patient again over the next 30 days.  Date/time of next scheduled RN care management/care coordination outreach:  12/08/22 @ 11:15am  Lurena Joiner RN, BSN [place]  [employer] manager

## 2022-11-05 NOTE — Patient Instructions (Signed)
Visit Information  Ms. Sluka was given information about Medicaid Managed Care team care coordination services as a part of their Healthy [facility] Medicaid benefit. Jerlyn Ly Hershey verbally consented to engagement with the [facility] Managed Care team.   If you are experiencing a medical emergency, please call 911 or report to your local emergency department or urgent care.   If you have a non-emergency medical problem during routine business hours, please contact your provider's office and ask to speak with a nurse.   For questions related to your Healthy [facility] health plan, please call: [phone] or visit the homepage here: [email or web address]  If you would like to schedule transportation through your Healthy [facility] plan, please call the following number at least 2 days in advance of your appointment: [phone]  For information about your ride after you set it up, call Ride Assist at [phone]. Use this number to activate a Will Call pickup, or if your transportation is late for a scheduled pickup. Use this number, too, if you need to make a change or cancel a previously scheduled reservation.  If you need transportation services right away, call [phone]. The after-hours call center is staffed 24 hours to handle ride assistance and urgent reservation requests (including discharges) 365 days a year. Urgent trips include sick visits, hospital discharge requests and life-sustaining treatment.  Call the [place] at [phone], at any time, 24 hours a day, 7 days a week. If you are in danger or need immediate medical attention call 911.  If you would like help to quit smoking, call 1-800-QUIT-NOW 404-547-0492) OR Espaol: 1-855-Djelo-Ya (5-631-497-0263) o para ms informacin haga clic aqu or Text READY to 200-400 to register via text  Ms. Heart,   Please see education materials related to fetal kick count  provided by MyChart link.  Patient verbalizes understanding of instructions and care plan provided today and agrees to view in [place]. Active MyChart status and patient understanding of how to access instructions and care plan via MyChart confirmed with patient.     Telephone follow up appointment with Managed Medicaid care management team member scheduled for:12/08/22 @ 11:15am  Lurena Joiner RN, BSN [place] RN Care Coordinator   Following is a copy of your plan of care:  Care Plan : RN Care Manager Plan of Care  Updates made by [name], RN since 11/05/2022 12:00 AM     Problem: Health Management Needs related to Health Maintenance, Anxiety and Depression      Long-Range Goal: Development of Plan of Care to address Health Management Needs related to Health Maintenance, Anxiety and Depression   Start Date: 12/18/2021  Expected End Date: 03/25/2023  Priority: High  Note:   Current Barriers:  Knowledge Deficits related to plan of care for management of Anxiety with Panic Symptoms, and Depression: depressed mood decreased appetite and Health Maintenance -Ms. Mizrahi had recent MAU visit for decreased fetal movement. She is attending OB visits and monthly behavioral health visits. Ms. Strawderman would like information on housing resources.     RNCM Clinical Goal(s):  Patient will verbalize understanding of plan for management of Anxiety, Depression, and Health Maintenance as evidenced by verbalization of self monitoring activities take all medications exactly as prescribed and will call provider for medication related questions as evidenced by documentation in EMR    work with social worker to address [place] Concerns  related to the management of Anxiety and Depression as evidenced by review  of EMR and patient or social worker report     through collaboration with Consulting civil engineer, provider, and care team. -Met Work with Care Guide for food insecurity  and needing a dental provider-Met  Interventions: Inter-disciplinary care team collaboration (see longitudinal plan of care) Evaluation of current treatment plan related to  self management and patient's adherence to plan as established by provider   Pregnancy :  (Status:  Goal on track:  Yes.)  Long Term Goal Recommended adequate rest     Advised to follow routine screening guidelines as outlined by obstetrics provider    Provided patient with Pregnancy education via [place] upcoming appointments including:11/18/22 in person OB visit and monthly [place] appointment  Advised to contact [facility] provider with concerns or questions Assessed for fetal movement Advised patient to contact [employer] member services [phone], for pregnancy benefits  Reviewed medications, advised patient to take antibiotic as directed Reviewed recent MAU visit, educated on fetal kick counts Referral to BSW for housing resources, patient is temporarily living with family, scheduled on 11/09/22   Patient Goals/Self-Care Activities: Take medications as prescribed   Attend church or other social activities Work with the Education officer, museum to address care coordination needs and will continue to work with the clinical team to address health care and disease management related needs call 1-800-273-TALK (toll free, 24 hour hotline) go to [facility] [address], [place] [phone]) call 911 if experiencing a Mental Health or [place]  Schedule new patient appointment with provider of choice

## 2022-11-09 ENCOUNTER — Other Ambulatory Visit: Payer: Medicaid Other

## 2022-11-09 NOTE — Patient Instructions (Signed)
Visit Information  Andrea Jenkins was given information about Medicaid Managed Care team care coordination services as a part of their Healthy [facility] Medicaid benefit. Andrea Jenkins verbally consented to engagement with the [facility] Managed Care team.   If you are experiencing a medical emergency, please call 911 or report to your local emergency department or urgent care.   If you have a non-emergency medical problem during routine business hours, please contact your provider's office and ask to speak with a nurse.   For questions related to your Healthy [facility] health plan, please call: [phone] or visit the homepage here: [email or web address]  If you would like to schedule transportation through your Healthy [facility] plan, please call the following number at least 2 days in advance of your appointment: [phone]  For information about your ride after you set it up, call Ride Assist at [phone]. Use this number to activate a Will Call pickup, or if your transportation is late for a scheduled pickup. Use this number, too, if you need to make a change or cancel a previously scheduled reservation.  If you need transportation services right away, call [phone]. The after-hours call center is staffed 24 hours to handle ride assistance and urgent reservation requests (including discharges) 365 days a year. Urgent trips include sick visits, hospital discharge requests and life-sustaining treatment.  Call the [place] at [phone], at any time, 24 hours a day, 7 days a week. If you are in danger or need immediate medical attention call 911.  If you would like help to quit smoking, call 1-800-QUIT-NOW (385)435-4353) OR Espaol: 1-855-Djelo-Ya (4-742-595-6387) o para ms informacin haga clic aqu or Text READY to 200-400 to register via text  Andrea Jenkins - following are the goals we discussed in your visit today:    Goals Addressed   None      Social Worker will follow up on 12/10/22.   Andrea Jenkins, BSW, [place]  High Risk Managed Medicaid Team  [phone]   Following is a copy of your plan of care:  Care Plan : LCSW Plan of Care  Updates made by Andrea Jenkins since 11/09/2022 12:00 AM     Problem: Anxiety Identification (Anxiety)      Goal: Anxiety Symptoms Identified   Start Date: 07/02/2022  Priority: High  Note:   Current Barriers:   Acute Mental Health Needs related to Anxiety and Financial Insecurities which requires Support, Education, Resources, Referrals, Advocacy, and Care Coordination, in order to meet Unmet Acute Mental Health Needs. Clinical Goal(s):  Patient will work with LCSW, to reduce and manage symptoms of Anxiety, Financial Insecurities, and Caregiver Stress, until well-controlled, or established with a community mental health provider.     Patient will increase knowledge and/or ability of:        Coping Skills, Healthy Habits, Self-Management Skills, Stress Reduction, Home Safety and Utilizing [employer] and Resources.   Interventions: Inter-disciplinary care team collaboration (see longitudinal plan of care). BSW completed a telephone outreach with patient, she states she is living with a family member, but they have been giving her hints to leave but not saying they want her to leave. Patient states she is pregnant and due in April. She has put her name on some waitlist. Patient states she does not have any income. BSW will send patient some income based housing options to [email or web address] Clinical Interventions:  Mindfulness Meditation Strategies, Relaxation Techniques, and Deep Breathing Exercises reviewed, and  encouraged daily. Referral made for [facility] BSW on 07/02/22 Referral made to [facility] for counseling on 07/02/22 Crisis resources provided and patient was encouraged to call [phone] if she experiences any  SI/HI Solution-Focused Therapy performed. Emotional Support provided. Verbalization of Feelings encouraged. Problem Solving Solutions developed. Cognitive Behavioral Therapy initiated. Joint call to [facility] today to schedule counseling appointment. Message was left.  Email sent to patient with resource information and [facility] walk in hours Patient Goals/Self-Care Activities: Work with [facility] BSW, in an effort to obtain transportation and job seeking resources Incorporate into daily practice - relaxation techniques, deep breathing exercises, and mindfulness meditation strategies. Wait for call from [facility] or call yourself to set up initial counseling appointment Contact LCSW directly (# [phone]), if you have questions, need assistance, or if additional social work needs are identified in the near future.        24- Hour Availability:    [facility]  [address] [place], [place] [phone] Crisis [phone]   [facility] [phone]   [place] [facility]  [phone]    [facility]  [phone] (after hours)   Therapeutic Alternative/Mobile Crisis   [phone]   [place] National Suicide Hotline  2255524476 Len Childs) [place] 017   Call 911 or go to emergency room   [facility]  [phone]);  [place] and [employer]  [phone]); [place], [place], [place], [place], [place], [place], [place]         The following coping skill education was provided for stress relief and mental health management: "When your car dies or a deadline looms, how do you respond? Long-term, low-grade or acute stress takes a serious toll on your body and mind, so don't ignore feelings of constant tension. Stress is a natural part of life. However, too much stress can harm our health, especially if it continues every day. This is chronic stress and can put you at risk for heart  problems like heart disease and depression. Understand what's happening inside your body and learn simple coping skills to combat the negative impacts of everyday stressors.  Types of Stress There are two types of stress: Emotional - types of emotional stress are relationship problems, pressure at work, financial worries, experiencing discrimination or having a major life change. Physical - Examples of physical stress include being sick having pain, not sleeping well, recovery from an injury or having an alcohol and drug use disorder. Fight or Flight Sudden or ongoing stress activates your nervous system and floods your bloodstream with adrenaline and cortisol, two hormones that raise blood pressure, increase heart rate and spike blood sugar. These changes pitch your body into a fight or flight response. That enabled our ancestors to outrun saber-toothed tigers, and it's helpful today for situations like dodging a car accident. But most modern chronic stressors, such as finances or a challenging relationship, keep your body in that heightened state, which hurts your health. Effects of Too Much Stress If constantly under stress, most of [place] will eventually start to function less well.  Multiple studies link chronic stress to a higher risk of heart disease, stroke, depression, weight gain, memory loss and even premature death, so it's important to recognize the warning signals. Talk to your doctor about ways to manage stress if you're experiencing any of these symptoms: Prolonged periods of poor sleep. Regular, severe headaches. Unexplained weight loss or gain. Feelings of isolation, withdrawal or worthlessness. Constant anger and  irritability. Loss of interest in activities. Constant worrying or obsessive thinking. Excessive alcohol or drug use. Inability to concentrate.  10 Ways to Cope with Chronic Stress It's key to recognize stressful situations as they occur because it allows you to focus  on managing how you react. We all need to know when to close our eyes and take a deep breath when we feel tension rising. Use these tips to prevent or reduce chronic stress. 1. Rebalance Work and Home All work and no play? If you're spending too much time at the office, intentionally put more dates in your calendar to enjoy time for fun, either alone or with others. 2. Get Regular Exercise Moving your body on a regular basis balances the nervous system and increases blood circulation, helping to flush out stress hormones. Even a daily 20-minute walk makes a difference. Any kind of exercise can lower stress and improve your mood ? just pick activities that you enjoy and make it a regular habit. 3. Eat Well and Limit Alcohol and Stimulants Alcohol, nicotine and caffeine may temporarily relieve stress but have negative health impacts and can make stress worse in the long run. Well-nourished bodies cope better, so start with a good breakfast, add more organic fruits and vegetables for a well-balanced diet, avoid processed foods and sugar, try herbal tea and drink more water. 4. Connect with Supportive People Talking face to face with another person releases hormones that reduce stress. Lean on those good listeners in your life. 5. [place] Time Do you enjoy gardening, reading, listening to music or some other creative pursuit? Engage in activities that bring you pleasure and joy; research shows that reduces stress by almost half and lowers your heart rate, too. 6. Practice Meditation, Stress Reduction or Yoga Relaxation techniques activate a state of restfulness that counterbalances your body's fight-or-flight hormones. Even if this also means a 10-minute break in a long day: listen to music, read, go for a walk in nature, do a hobby, take a bath or spend time with a friend. Also consider doing a mindfulness exercise or try a daily deep breathing or imagery practice. Deep Breathing Slow, calm and  deep breathing can help you relax. Try these steps to focus on your breathing and repeat as needed. Find a comfortable position and close your eyes. Exhale and drop your shoulders. Breathe in through your nose; fill your lungs and then your belly. Think of relaxing your body, quieting your mind and becoming calm and peaceful. Breathe out slowly through your nose, relaxing your belly. Think of releasing tension, pain, worries or distress. Repeat steps three and four until you feel relaxed. Imagery This involves using your mind to excite the senses -- sound, vision, smell, taste and feeling. This may help ease your stress. Begin by getting comfortable and then do some slow breathing. Imagine a place you love being at. It could be somewhere from your childhood, somewhere you vacationed or just a place in your imagination. Feel how it is to be in the place you're imagining. Pay attention to the sounds, air, colors, and who is there with you. This is a place where you feel cared for and loved. All is well. You are safe. Take in all the smells, sounds, tastes and feelings. As you do, feel your body being nourished and healed. Feel the calm that surrounds you. Breathe in all the good. Breathe out any discomfort or tension. 7. Sleep Enough If you get less than seven to eight  hours of sleep, your body won't tolerate stress as well as it could. If stress keeps you up at night, address the cause, and add extra meditation into your day to make up for the lost z's. Try to get seven to nine hours of sleep each night. Make a regular bedtime schedule. Keep your room dark and cool. Try to avoid computers, TV, cell phones and tablets before bed. 8. Bond with Connections You Enjoy Go out for a coffee with a friend, chat with a neighbor, call a family member, visit with a clergy member, or even hang out with your pet. Clinical studies show that spending even a short time with a companion animal can cut anxiety levels  almost in half. 9. Take a Vacation Getting away from it all can reset your stress tolerance by increasing your mental and emotional outlook, which makes you a happier, more productive person upon return. Leave your cellphone and laptop at home! 10. See a Counselor, Coach or Therapist If negative thoughts overwhelm your ability to make positive changes, it's time to seek professional help. Make an appointment today--your health and life are worth it."

## 2022-11-09 NOTE — Patient Outreach (Signed)
Medicaid Managed Care Social Work Note  11/09/2022 Name:  Andrea Jenkins MRN:  [id number] DOB:  06-Feb-1996  Andrea Jenkins is an 27 y.o. year old female who is a primary patient of Andrea Contes, MD.  The Medicaid Managed Care Coordination team was consulted for assistance with:   housing  [name] was given information about Medicaid Managed Care Coordination team services today. [place] Patient agreed to services and verbal consent obtained.  Engaged with patient  for by telephone forfollow up visit in response to referral for case management and/or care coordination services.   Assessments/Interventions:  Review of past medical history, allergies, medications, health status, including review of consultants reports, laboratory and other test data, was performed as part of comprehensive evaluation and provision of chronic care management services.  SDOH: (Social Determinant of Health) assessments and interventions performed: SDOH Interventions    Flowsheet Row Patient Outreach Telephone from 11/05/2022 in [place] Patient Outreach Telephone from 10/06/2022 in [place] Patient Outreach Telephone from 09/02/2022 in [place] Patient Outreach Telephone from 07/02/2022 in [place] Coordination Patient Outreach Telephone from 04/30/2022 in [place] Patient Outreach Telephone from 12/25/2021 in [place] Coordination  SDOH Interventions        Food Insecurity Interventions Intervention Not Indicated -- -- -- -- [id number] Referral, Assist with [employer], Information systems manager ([facility]), Other (Comment)  Andrea Jenkins Interventions Other (Comment)  [BSW referral] Intervention Not Indicated -- -- -- Intervention Not Indicated  Transportation  Interventions -- Intervention Not Indicated Intervention Not Indicated -- Intervention Not Indicated Intervention Not Indicated  Utilities Interventions Intervention Not Indicated -- Intervention Not Indicated -- -- --  Depression Interventions/Treatment  -- -- -- Medication -- Patient refuses Treatment  [Only requesting list of resources]  Financial Strain Interventions -- -- -- -- -- Other (Comment)  [Care Guide Assistance]  Physical Activity Interventions -- -- -- -- -- Patient Refused  Stress Interventions -- -- -- Andrea Jenkins, Provide Counseling -- Andrea Jenkins, Other (Comment)  [Offered Counseling - Patient Refused]  Social Connections Interventions -- -- -- -- -- Intervention Not Indicated     BSW completed a telephone outreach with patient, she states she is living with a family member, but they have been giving her hints to leave but not saying they want her to leave. Patient states she is pregnant and due in April. She has put her name on some waitlist. Patient states she does not have any income. BSW will send patient some income based housing options to Andrea Jenkins  Advanced Directives Status:  Not addressed in this encounter.  Care Plan                 Allergies  Allergen Reactions   Andrea 1-35 (21) [Norethin-Eth Estrad Triphasic] Anaphylaxis   Andrea Jenkins [Prunus Persica] Hives and Itching   Andrea Jenkins [Ceftriaxone] Hives and Itching    Medications Reviewed Today     Reviewed by Andrea Montane, RN (Registered Nurse) on 11/05/22 at [place] List Status: <None>   Medication Order Taking? Sig Documenting Provider Last Dose Status Informant  acetaminophen (TYLENOL) 325 MG tablet [id number] Yes Take 2 tablets (650 mg total) by mouth every 4 (four) hours as needed for moderate pain or headache. Andrea Jenkins, CNM Taking Active   ARIPiprazole (ABILIFY) 10  MG tablet [id number] Yes Take 1 tablet (10 mg total) by mouth  daily. Bobbye Morton, MD Taking Active   cetirizine (ZYRTEC ALLERGY) 10 MG tablet [id number] Yes Take 1 tablet (10 mg total) by mouth daily. Calvert Cantor, CNM Taking Active   cyclobenzaprine (FLEXERIL) 10 MG tablet [id number] Yes Take 1 tablet (10 mg total) by mouth 2 (two) times daily as needed for muscle spasms. Calvert Cantor, CNM Taking Active   EPINEPHrine (EPIPEN 2-PAK) 0.3 mg/0.3 mL IJ SOAJ injection [id number]  Inject 0.3 mg into the muscle as needed for anaphylaxis. Dimple Nanas, MD  Active   nitrofurantoin, macrocrystal-monohydrate, (MACROBID) 100 MG capsule [id number] No Take 1 capsule (100 mg total) by mouth 2 (two) times daily.  Patient not taking: Reported on 11/05/2022   Calvert Cantor, CNM Not Taking Active            Med Note (ROBB, MELANIE A   Fri Nov 05, 2022 10:05 AM) Has not started, will start taking today  Nutritional Supplements [facility] ACTIVE) LIQD [id number] No Take 1 Can by mouth as needed.  Patient not taking: Reported on 10/06/2022   Hermina Staggers, MD Not Taking Active   Prenatal Vit-Fe Fumarate-FA (PREPLUS) 27-1 MG TABS [id number] Yes Take 1 tablet by mouth daily. 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   Supervision of other normal pregnancy, antepartum 08/04/2022   Generalized anxiety disorder 03/26/2022   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   Bipolar disorder, current episode mixed, moderate (HCC) 03/26/2022   Fatigue 03/19/2022   Overweight (BMI 25.0-29.9) 03/19/2022   Bipolar disorder with severe depression (HCC)    Anxiety    Alpha thalassemia silent carrier 10/24/2019    Conditions to be addressed/monitored per PCP order:   housing  Care Plan : LCSW Plan of Care  Updates made by Shaune Leeks since 11/09/2022 12:00 AM     Problem: Anxiety Identification (Anxiety)      Goal: Anxiety Symptoms Identified   Start  Date: 07/02/2022  Priority: High  Note:   Current Barriers:   Acute Mental Health Needs related to Anxiety and Financial Insecurities which requires Support, Education, Resources, Referrals, Advocacy, and Care Coordination, in order to meet Unmet Acute Mental Health Needs. Clinical Goal(s):  Patient will work with LCSW, to reduce and manage symptoms of Anxiety, Financial Insecurities, and Caregiver Stress, until well-controlled, or established with a community mental health provider.     Patient will increase knowledge and/or ability of:        Coping Skills, Healthy Habits, Self-Management Skills, Stress Reduction, Home Safety and Utilizing [employer] and Resources.   Interventions: Inter-disciplinary care team collaboration (see longitudinal plan of care). BSW completed a telephone outreach with patient, she states she is living with a family member, but they have been giving her hints to leave but not saying they want her to leave. Patient states she is pregnant and due in April. She has put her name on some waitlist. Patient states she does not have any income. BSW will send patient some income based housing options to Andrea Jenkins Clinical Interventions:  Mindfulness Meditation Strategies, Relaxation Techniques, and Deep Breathing Exercises reviewed, and encouraged daily. Referral made for [facility] BSW on 07/02/22 Referral made to [facility] for counseling on 07/02/22 Crisis resources provided and patient was encouraged to call  988 if she experiences any SI/HI Solution-Focused Therapy performed. Emotional Support provided. Verbalization of Feelings encouraged. Problem Solving Solutions developed. Cognitive Behavioral Therapy initiated. Joint call to [facility] today to schedule counseling appointment. Message was left.  Email sent to patient with resource information and [facility] walk in hours Patient Goals/Self-Care Activities: Work with [place], in an effort to obtain transportation and  job seeking resources Incorporate into daily practice - relaxation techniques, deep breathing exercises, and mindfulness meditation strategies. Wait for call from [facility] or call yourself to set up initial counseling appointment Contact LCSW directly (# [phone]), if you have questions, need assistance, or if additional social work needs are identified in the near future.        24- Hour Availability:    [facility]  [address] [place], [place] [facility] [phone]   [facility] [employer] [place]  [phone]    [place]  [phone] (after hours)   Therapeutic Alternative/Mobile Crisis   [phone]   [place] National Suicide Hotline  217 416 5818 Diamantina Monks) [place] 988   Call 911 or go to emergency room   [facility]  [phone]);  [place] and [employer]  [phone]); [place], [place], [place], [place], [place], [place], [place]         The following coping skill education was provided for stress relief and mental health management: "When your car dies or a deadline looms, how do you respond? Long-term, low-grade or acute stress takes a serious toll on your body and mind, so don't ignore feelings of constant tension. Stress is a natural part of life. However, too much stress can harm our health, especially if it continues every day. This is chronic stress and can put you at risk for heart problems like heart disease and depression. Understand what's happening inside your body and learn simple coping skills to combat the negative impacts of everyday stressors.  Types of Stress There are two types of stress: Emotional - types of emotional stress are relationship problems, pressure at work, financial worries, experiencing discrimination or having a major life change. Physical - Examples of physical stress include being  sick having pain, not sleeping well, recovery from an injury or having an alcohol and drug use disorder. Fight or Flight Sudden or ongoing stress activates your nervous system and floods your bloodstream with adrenaline and cortisol, two hormones that raise blood pressure, increase heart rate and spike blood sugar. These changes pitch your body into a fight or flight response. That enabled our ancestors to outrun saber-toothed tigers, and it's helpful today for situations like dodging a car accident. But most modern chronic stressors, such as finances or a challenging relationship, keep your body in that heightened state, which hurts your health. Effects of Too Much Stress If constantly under stress, most of [place] will eventually start to function less well.  Multiple studies link chronic stress to a higher risk of heart disease, stroke, depression, weight gain, memory loss and even premature death, so it's important to recognize the warning signals. Talk to your doctor about ways to manage stress if you're experiencing any of these symptoms: Prolonged periods of poor sleep. Regular, severe headaches. Unexplained weight loss or gain. Feelings of isolation, withdrawal or worthlessness. Constant anger and irritability. Loss of interest in activities. Constant worrying or obsessive thinking. Excessive alcohol or drug use. Inability to concentrate.  10 Ways to Cope with Chronic  Stress It's key to recognize stressful situations as they occur because it allows you to focus on managing how you react. We all need to know when to close our eyes and take a deep breath when we feel tension rising. Use these tips to prevent or reduce chronic stress. 1. Rebalance Work and Home All work and no play? If you're spending too much time at the office, intentionally put more dates in your calendar to enjoy time for fun, either alone or with others. 2. Get Regular Exercise Moving your body on a regular basis balances  the nervous system and increases blood circulation, helping to flush out stress hormones. Even a daily 20-minute walk makes a difference. Any kind of exercise can lower stress and improve your mood ? just pick activities that you enjoy and make it a regular habit. 3. Eat Well and Limit Alcohol and Stimulants Alcohol, nicotine and caffeine may temporarily relieve stress but have negative health impacts and can make stress worse in the long run. Well-nourished bodies cope better, so start with a good breakfast, add more organic fruits and vegetables for a well-balanced diet, avoid processed foods and sugar, try herbal tea and drink more water. 4. Connect with Supportive People Talking face to face with another person releases hormones that reduce stress. Lean on those good listeners in your life. 5. [place] Time Do you enjoy gardening, reading, listening to music or some other creative pursuit? Engage in activities that bring you pleasure and joy; research shows that reduces stress by almost half and lowers your heart rate, too. 6. Practice Meditation, Stress Reduction or Yoga Relaxation techniques activate a state of restfulness that counterbalances your body's fight-or-flight hormones. Even if this also means a 10-minute break in a long day: listen to music, read, go for a walk in nature, do a hobby, take a bath or spend time with a friend. Also consider doing a mindfulness exercise or try a daily deep breathing or imagery practice. Deep Breathing Slow, calm and deep breathing can help you relax. Try these steps to focus on your breathing and repeat as needed. Find a comfortable position and close your eyes. Exhale and drop your shoulders. Breathe in through your nose; fill your lungs and then your belly. Think of relaxing your body, quieting your mind and becoming calm and peaceful. Breathe out slowly through your nose, relaxing your belly. Think of releasing tension, pain, worries or  distress. Repeat steps three and four until you feel relaxed. Imagery This involves using your mind to excite the senses -- sound, vision, smell, taste and feeling. This may help ease your stress. Begin by getting comfortable and then do some slow breathing. Imagine a place you love being at. It could be somewhere from your childhood, somewhere you vacationed or just a place in your imagination. Feel how it is to be in the place you're imagining. Pay attention to the sounds, air, colors, and who is there with you. This is a place where you feel cared for and loved. All is well. You are safe. Take in all the smells, sounds, tastes and feelings. As you do, feel your body being nourished and healed. Feel the calm that surrounds you. Breathe in all the good. Breathe out any discomfort or tension. 7. Sleep Enough If you get less than seven to eight hours of sleep, your body won't tolerate stress as well as it could. If stress keeps you up at night, address the cause, and add extra  meditation into your day to make up for the lost z's. Try to get seven to nine hours of sleep each night. Make a regular bedtime schedule. Keep your room dark and cool. Try to avoid computers, TV, cell phones and tablets before bed. 8. Bond with Connections You Enjoy Go out for a coffee with a friend, chat with a neighbor, call a family member, visit with a clergy member, or even hang out with your pet. Clinical studies show that spending even a short time with a companion animal can cut anxiety levels almost in half. 9. Take a Vacation Getting away from it all can reset your stress tolerance by increasing your mental and emotional outlook, which makes you a happier, more productive person upon return. Leave your cellphone and laptop at home! 10. See a Counselor, Coach or Therapist If negative thoughts overwhelm your ability to make positive changes, it's time to seek professional help. Make an appointment today--your health  and life are worth it."       Follow up:  Patient agrees to Care Plan and Follow-up.  Plan: The Managed Medicaid care management team will reach out to the patient again over the next 30 days.  Date/time of next scheduled Social Work care management/care coordination outreach:  12/10/22  Mickel Fuchs, Arita Miss, [place] Medicaid Team  [phone]

## 2022-11-11 ENCOUNTER — Encounter (HOSPITAL_COMMUNITY): Payer: Self-pay | Admitting: Obstetrics & Gynecology

## 2022-11-11 ENCOUNTER — Inpatient Hospital Stay (HOSPITAL_COMMUNITY)
Admission: AD | Admit: 2022-11-11 | Discharge: 2022-11-11 | Disposition: A | Discharge status: Home / Self Care | Payer: Medicaid Other | Attending: Obstetrics & Gynecology | Admitting: Obstetrics & Gynecology

## 2022-11-11 ENCOUNTER — Other Ambulatory Visit: Payer: Self-pay

## 2022-11-11 DIAGNOSIS — Z3A27 27 weeks gestation of pregnancy: Secondary | ICD-10-CM | POA: Diagnosis not present

## 2022-11-11 DIAGNOSIS — R102 Pelvic and perineal pain: Secondary | ICD-10-CM | POA: Diagnosis not present

## 2022-11-11 DIAGNOSIS — M5431 Sciatica, right side: Secondary | ICD-10-CM | POA: Diagnosis not present

## 2022-11-11 DIAGNOSIS — O26892 Other specified pregnancy related conditions, second trimester: Secondary | ICD-10-CM | POA: Insufficient documentation

## 2022-11-11 DIAGNOSIS — M79651 Pain in right thigh: Secondary | ICD-10-CM | POA: Diagnosis not present

## 2022-11-11 LAB — URINALYSIS, ROUTINE W REFLEX MICROSCOPIC
Bilirubin Urine: NEGATIVE
Glucose, UA: NEGATIVE mg/dL
Hgb urine dipstick: NEGATIVE
Ketones, ur: NEGATIVE mg/dL
Leukocytes,Ua: NEGATIVE
Nitrite: NEGATIVE
Protein, ur: NEGATIVE mg/dL
Specific Gravity, Urine: 1.01 (ref 1.005–1.030)
pH: 8 (ref 5.0–8.0)

## 2022-11-11 LAB — WET PREP, GENITAL
Clue Cells Wet Prep HPF POC: NONE SEEN
Sperm: NONE SEEN
Trich, Wet Prep: NONE SEEN
WBC, Wet Prep HPF POC: >=10 — AB (ref <10)
Yeast Wet Prep HPF POC: NONE SEEN

## 2022-11-11 MED ORDER — LACTATED RINGERS IV BOLUS
1000.0000 mL | Freq: Once | INTRAVENOUS | Status: AC
  Administered 2022-11-11: 1000 mL via INTRAVENOUS

## 2022-11-11 MED ORDER — HYDROCODONE-ACETAMINOPHEN 5-325 MG PO TABS
2.0000 | ORAL_TABLET | Freq: Once | ORAL | Status: AC
  Administered 2022-11-11: 2 via ORAL
  Filled 2022-11-11: qty 2

## 2022-11-11 NOTE — MAU Note (Addendum)
Andrea Jenkins is a 27 y.o. at [redacted]w[redacted]d here in MAU reporting: she has lower abdominal pain that travels into her right leg all week.  States pain in abdomen is constant and then has sharp shooting pain going down her leg.  Reports movement and walking is painful.    States once this earlier this week had fluid with a "strange smell" leaking.  Reports hasn't continued to leak since.  Denies VB.  Endorses +FM. LMP: NA Onset of complaint: all week Pain score: 8 Vitals:   11/11/22 0949  BP: (!) 108/58  Pulse: 73  Resp: 18  Temp: 98.8 F (37.1 C)  SpO2: 100%     FHT: 151 bpm Lab orders placed from triage:  UA
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
## 2022-11-12 LAB — GC/CHLAMYDIA PROBE AMP (~~LOC~~) NOT AT ARMC
Chlamydia: NEGATIVE
Comment: NEGATIVE
Comment: NORMAL
Neisseria Gonorrhea: NEGATIVE

## 2022-11-15 ENCOUNTER — Ambulatory Visit (INDEPENDENT_AMBULATORY_CARE_PROVIDER_SITE_OTHER): Payer: Medicaid Other | Admitting: Obstetrics and Gynecology

## 2022-11-15 ENCOUNTER — Encounter: Payer: Self-pay | Admitting: Obstetrics and Gynecology

## 2022-11-15 VITALS — BP 125/71 | HR 100

## 2022-11-15 DIAGNOSIS — Z3483 Encounter for supervision of other normal pregnancy, third trimester: Secondary | ICD-10-CM

## 2022-11-15 DIAGNOSIS — Z3A28 28 weeks gestation of pregnancy: Secondary | ICD-10-CM

## 2022-11-15 DIAGNOSIS — Z3480 Encounter for supervision of other normal pregnancy, unspecified trimester: Secondary | ICD-10-CM

## 2022-11-15 NOTE — Progress Notes (Signed)
Subjective:  Andrea Jenkins is a 27 y.o. G4P3003 at [redacted]w[redacted]d being seen today for ongoing prenatal care.  She is currently monitored for the following issues for this low-risk pregnancy and has Alpha thalassemia silent carrier; Anxiety; Bipolar disorder with severe depression ([place]); Fatigue; Overweight (BMI 25.0-29.9); Generalized anxiety disorder; Insomnia due to other mental disorder; Bipolar disorder, current episode mixed, moderate ([place]); Supervision of other normal pregnancy, antepartum; and Pica in adults on their problem list.  Patient reports backache.  Contractions: Not present. Vag. Bleeding: None.  Movement: Present. Denies leaking of fluid.   The following portions of the patient's history were reviewed and updated as appropriate: allergies, current medications, past family history, past medical history, past social history, past surgical history and problem list. Problem list updated.  Objective:   Vitals:   11/15/22 1522  BP: 125/71  Pulse: 100    Fetal Status: Fetal Heart Rate (bpm): 150   Movement: Present     General:  Alert, oriented and cooperative. Patient is in no acute distress.  Skin: Skin is warm and dry. No rash noted.   Cardiovascular: Normal heart rate noted  Respiratory: Normal respiratory effort, no problems with respiration noted  Abdomen: Soft, gravid, appropriate for gestational age. Pain/Pressure: Present     Pelvic:  Cervical exam deferred        Extremities: Normal range of motion.     Mental Status: Normal mood and affect. Normal behavior. Normal judgment and thought content.   Urinalysis:      Assessment and Plan:  Pregnancy: G4P3003 at [redacted]w[redacted]d  1. Supervision of other normal pregnancy, antepartum Stable Referral to PT for back pain Unable to take Flexeril due to having to care for children  Preterm labor symptoms and general obstetric precautions including but not limited to vaginal bleeding, contractions, leaking of fluid and fetal movement  were reviewed in detail with the patient. Please refer to After Visit Summary for other counseling recommendations.  Return in about 2 weeks (around 11/29/2022) for OB visit, face to face, any provider.   [name], MD

## 2022-11-15 NOTE — Patient Instructions (Signed)

## 2022-11-15 NOTE — Progress Notes (Signed)
Pt states she was seen at [facility] - gush of fluid and back/leg pain.   Pt states she is still having leg/back pain and numbness - unable to take Flexeril as makes her drowsy.

## 2022-11-18 ENCOUNTER — Encounter: Payer: Medicaid Other | Admitting: Student

## 2022-11-21 ENCOUNTER — Observation Stay (HOSPITAL_BASED_OUTPATIENT_CLINIC_OR_DEPARTMENT_OTHER): Payer: Medicaid Other

## 2022-11-21 ENCOUNTER — Observation Stay (HOSPITAL_COMMUNITY)
Admission: AD | Admit: 2022-11-21 | Discharge: 2022-11-23 | Disposition: A | Discharge status: Home / Self Care | Payer: Medicaid Other | Attending: Obstetrics and Gynecology | Admitting: Obstetrics and Gynecology

## 2022-11-21 ENCOUNTER — Encounter (HOSPITAL_COMMUNITY): Payer: Self-pay | Admitting: Obstetrics & Gynecology

## 2022-11-21 ENCOUNTER — Other Ambulatory Visit: Payer: Self-pay

## 2022-11-21 DIAGNOSIS — W19XXXD Unspecified fall, subsequent encounter: Secondary | ICD-10-CM

## 2022-11-21 DIAGNOSIS — O9A213 Injury, poisoning and certain other consequences of external causes complicating pregnancy, third trimester: Principal | ICD-10-CM | POA: Insufficient documentation

## 2022-11-21 DIAGNOSIS — O0993 Supervision of high risk pregnancy, unspecified, third trimester: Secondary | ICD-10-CM | POA: Insufficient documentation

## 2022-11-21 DIAGNOSIS — Z87891 Personal history of nicotine dependence: Secondary | ICD-10-CM | POA: Diagnosis not present

## 2022-11-21 DIAGNOSIS — O99513 Diseases of the respiratory system complicating pregnancy, third trimester: Secondary | ICD-10-CM | POA: Diagnosis not present

## 2022-11-21 DIAGNOSIS — J45909 Unspecified asthma, uncomplicated: Secondary | ICD-10-CM | POA: Insufficient documentation

## 2022-11-21 DIAGNOSIS — Z3A29 29 weeks gestation of pregnancy: Secondary | ICD-10-CM | POA: Diagnosis not present

## 2022-11-21 DIAGNOSIS — O9A219 Injury, poisoning and certain other consequences of external causes complicating pregnancy, unspecified trimester: Secondary | ICD-10-CM

## 2022-11-21 DIAGNOSIS — Z3A28 28 weeks gestation of pregnancy: Secondary | ICD-10-CM

## 2022-11-21 HISTORY — DX: Bipolar disorder, unspecified: F31.9

## 2022-11-21 LAB — CBC
HCT: 32.7 % — ABNORMAL LOW (ref 36.0–46.0)
Hemoglobin: 11 g/dL — ABNORMAL LOW (ref 12.0–15.0)
MCH: 28.6 pg (ref 26.0–34.0)
MCHC: 33.6 g/dL (ref 30.0–36.0)
MCV: 85.2 fL (ref 80.0–100.0)
Platelets: 150 10*3/uL (ref 150–400)
RBC: 3.84 MIL/uL — ABNORMAL LOW (ref 3.87–5.11)
RDW: 12.8 % (ref 11.5–15.5)
WBC: 9.8 10*3/uL (ref 4.0–10.5)
nRBC: 0 % (ref 0.0–0.2)

## 2022-11-21 LAB — TYPE AND SCREEN
ABO/RH(D): A POS
Antibody Screen: NEGATIVE

## 2022-11-21 MED ORDER — PRENATAL MULTIVITAMIN CH
1.0000 | ORAL_TABLET | Freq: Every day | ORAL | Status: DC
  Administered 2022-11-22: 1 via ORAL
  Filled 2022-11-21 (×2): qty 1

## 2022-11-21 MED ORDER — SODIUM CHLORIDE 0.9% FLUSH
3.0000 mL | INTRAVENOUS | Status: DC | PRN
  Administered 2022-11-22: 3 mL via INTRAVENOUS

## 2022-11-21 MED ORDER — CYCLOBENZAPRINE HCL 10 MG PO TABS
10.0000 mg | ORAL_TABLET | Freq: Two times a day (BID) | ORAL | Status: DC | PRN
  Administered 2022-11-22: 10 mg via ORAL
  Filled 2022-11-21: qty 1

## 2022-11-21 MED ORDER — LACTATED RINGERS IV SOLN: 125.0000 mL/h | INTRAVENOUS | Status: AC

## 2022-11-21 MED ORDER — SODIUM CHLORIDE 0.9% FLUSH
3.0000 mL | Freq: Two times a day (BID) | INTRAVENOUS | Status: DC
  Administered 2022-11-22 (×2): 3 mL via INTRAVENOUS

## 2022-11-21 MED ORDER — DOCUSATE SODIUM 100 MG PO CAPS
100.0000 mg | ORAL_CAPSULE | Freq: Every day | ORAL | Status: DC
  Administered 2022-11-22 (×2): 100 mg via ORAL
  Filled 2022-11-21 (×2): qty 1

## 2022-11-21 MED ORDER — SODIUM CHLORIDE 0.9 % IV SOLN: 250.0000 mL | INTRAVENOUS | Status: DC | PRN

## 2022-11-21 MED ORDER — PREPLUS 27-1 MG PO TABS: 1.0000 | ORAL_TABLET | Freq: Every day | ORAL | Status: DC

## 2022-11-21 MED ORDER — ARIPIPRAZOLE 10 MG PO TABS
10.0000 mg | ORAL_TABLET | Freq: Every day | ORAL | Status: DC
  Administered 2022-11-22: 10 mg via ORAL
  Filled 2022-11-21 (×2): qty 1

## 2022-11-21 MED ORDER — CALCIUM CARBONATE ANTACID 500 MG PO CHEW: 2.0000 | CHEWABLE_TABLET | ORAL | Status: DC | PRN

## 2022-11-21 MED ORDER — ZOLPIDEM TARTRATE 5 MG PO TABS: 5.0000 mg | ORAL_TABLET | Freq: Every evening | ORAL | Status: DC | PRN

## 2022-11-21 MED ORDER — ACETAMINOPHEN 325 MG PO TABS
650.0000 mg | ORAL_TABLET | ORAL | Status: DC | PRN
  Administered 2022-11-22: 650 mg via ORAL
  Filled 2022-11-21: qty 2

## 2022-11-21 NOTE — [facility] Note (Signed)
.  Andrea Jenkins is a 27 y.o. at [redacted]w[redacted]d here in [facility] reporting: at 2045 was going inside the house and missed a step - fell forward onto her stomach "hard" - knees and arms are scraped up. Denies ctx, VB or LOF. Has not felt baby move all day - can't remember last time she felt movement pt states "I've been kind of manic, I have bipolar disorder".   Onset of complaint: 2045 Pain score: 6 -abdomen  Vitals:   11/21/22 1938  BP: 112/60  Pulse: 85  Resp: 16  Temp: 98.8 F (37.1 C)  SpO2: 100%     FHT:147 Lab orders placed from triage:  ua
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
## 2022-11-22 DIAGNOSIS — Z3A29 29 weeks gestation of pregnancy: Secondary | ICD-10-CM

## 2022-11-22 DIAGNOSIS — O9A213 Injury, poisoning and certain other consequences of external causes complicating pregnancy, third trimester: Principal | ICD-10-CM

## 2022-11-22 LAB — KLEIHAUER-BETKE STAIN
Fetal Cells %: 0 %
Quantitation Fetal Hemoglobin: 0 mL

## 2022-11-22 MED ORDER — PROMETHAZINE HCL 25 MG PO TABS: 25.0000 mg | ORAL_TABLET | Freq: Four times a day (QID) | ORAL | Status: DC | PRN

## 2022-11-22 MED ORDER — PANTOPRAZOLE SODIUM 40 MG PO TBEC
40.0000 mg | DELAYED_RELEASE_TABLET | Freq: Every day | ORAL | Status: DC
  Administered 2022-11-22: 40 mg via ORAL
  Filled 2022-11-22: qty 1

## 2022-11-22 NOTE — Progress Notes (Signed)
Patient ID: Andrea Jenkins, female   DOB: 08/10/1996, 27 y.o.   MRN: [id number] [place]) NOTE  Andrea Jenkins is a 27 y.o. [phone] at [redacted]w[redacted]d  who is admitted for observation following a fall.    Fetal presentation is unsure. Length of Stay:  0  Days  Date of admission:11/21/2022  Subjective: Patient reports feeling well overall with some abdominal soreness. She reports severe pain overnight which improved with pain medication and a muscle relaxer. Pain is now reduced to soreness this morning.  Patient reports the fetal movement as active. Patient reports uterine contraction  activity as none. Patient reports  vaginal bleeding as none. Patient describes fluid per vagina as None.  Vitals:  Blood pressure (!) 102/57, pulse 83, temperature 98.3 F (36.8 C), temperature source Oral, resp. rate 14, height 5\' 3"  (1.6 m), weight 84 kg, last menstrual period 05/03/2022, SpO2 99 %, not currently breastfeeding. Vitals:   11/21/22 1938 11/21/22 2136 11/22/22 0351 11/22/22 0741  BP: 112/60 (!) 106/55 (!) 99/56 (!) 102/57  Pulse: 85 78 72 83  Resp: 16 18 15 14   Temp: 98.8 F (37.1 C) 98.3 F (36.8 C) 98.1 F (36.7 C) 98.3 F (36.8 C)  TempSrc:  Oral Oral Oral  SpO2: 100% 100% 100% 99%  Weight: 84 kg     Height: 5\' 3"  (1.6 m)      Physical Examination: GENERAL: Well-developed, well-nourished female in no acute distress.  LUNGS: Clear to auscultation bilaterally.  HEART: Regular rate and rhythm. ABDOMEN: Soft, nontender, gravid PELVIC: Not indicated EXTREMITIES: No cyanosis, clubbing, or edema, 2+ distal pulses.   Fetal Monitoring:  Baseline: 135 bpm, Variability: Good {> 6 bpm), Accelerations: Reactive, and Decelerations: Absent   reactive  Labs:  Results for orders placed or performed during the hospital encounter of 11/21/22 (from the past 24 hour(s))  CBC   Collection Time: 11/21/22  8:31 PM  Result Value Ref Range   WBC 9.8 4.0 - 10.5 K/uL    RBC 3.84 (L) 3.87 - 5.11 MIL/uL   Hemoglobin 11.0 (L) 12.0 - 15.0 g/dL   HCT 32.7 (L) 36.0 - 46.0 %   MCV 85.2 80.0 - 100.0 fL   MCH 28.6 26.0 - 34.0 pg   MCHC 33.6 30.0 - 36.0 g/dL   RDW 12.8 11.5 - 15.5 %   Platelets 150 150 - 400 K/uL   nRBC 0.0 0.0 - 0.2 %  Kleihauer-Betke stain   Collection Time: 11/21/22  8:31 PM  Result Value Ref Range   Fetal Cells % 0 %   Quantitation Fetal Hemoglobin 0.0000 mL   # Vials RhIg NOT INDICATED   Type and screen [place]   Collection Time: 11/21/22  8:57 PM  Result Value Ref Range   ABO/RH(D) A POS    Antibody Screen NEG    Sample Expiration      11/24/2022,2359 Performed at [facility], [address], [place], [place] [address]     Imaging Studies:    No results found.   Medications:  Scheduled  ARIPiprazole  10 mg Oral Daily   docusate sodium  100 mg Oral Daily   prenatal multivitamin  1 tablet Oral Q1200   sodium chloride flush  3 mL Intravenous Q12H   I have reviewed the patient's current medications.  ASSESSMENT: Patient Active Problem List   Diagnosis Date Noted   Traumatic injury during pregnancy in third trimester 11/21/2022   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  Supervision of other normal pregnancy, antepartum 08/04/2022   Generalized anxiety disorder 03/26/2022   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   Bipolar disorder, current episode mixed, moderate ([place]) 03/26/2022   Fatigue 03/19/2022   Overweight (BMI 25.0-29.9) 03/19/2022   Bipolar disorder with severe depression ([place])    Anxiety    Alpha thalassemia silent carrier 10/24/2019    PLAN: 27 yo G4P3 at [redacted]w[redacted]d here for observation following a fall - Maternal fetal status remain stable and reassuring - Patient will be 24 hours post fall around 7 pm and prefers discharge tomorrow morning - Follow up ultrasound report - Continue current care  Daeshon Grammatico 11/22/2022,8:57 AM

## 2022-11-23 DIAGNOSIS — Z3A29 29 weeks gestation of pregnancy: Secondary | ICD-10-CM | POA: Diagnosis not present

## 2022-11-23 DIAGNOSIS — O9A213 Injury, poisoning and certain other consequences of external causes complicating pregnancy, third trimester: Secondary | ICD-10-CM | POA: Diagnosis not present

## 2022-11-23 NOTE — Progress Notes (Signed)
OB Discharge Summary     Patient Name: Andrea Jenkins DOB: November 15, 1995 MRN: [id number]  Date of admission: 11/21/2022   Date of discharge: 11/23/2022  Admitting diagnosis: Traumatic injury during pregnancy in third trimester [O9A.213] Intrauterine pregnancy: [redacted]w[redacted]d     Secondary diagnosis:  Principal Problem:   Traumatic injury during pregnancy in third trimester      Discharge diagnosis: Traumatic injury during third trimester of pregnancy                                                                                                Hospital course:  Patient admitted at 29w0 following a significant fall on her abdomen. Patient admitted for observation and maternal fetal status remained stable. She reports fetal movement and denies vaginal bleeding or contractions. She admits to abdominal soreness at the impacted site. Patient is eagerly ready to be discharged with plans to follow up for ROB on 11/29/2022  Physical exam  Vitals:   11/22/22 1157 11/22/22 1535 11/22/22 1952 11/23/22 0805  BP: (!) 102/52 (!) 105/52 (!) 112/58 101/62  Pulse: 81 66 79 74  Resp: 18 14 16 16   Temp: 98.1 F (36.7 C) 98.2 F (36.8 C) 98 F (36.7 C) 98.2 F (36.8 C)  TempSrc: Oral Oral Oral Oral  SpO2: 100% 100% 100% 100%  Weight:      Height:       GENERAL: Well-developed, well-nourished female in no acute distress.  LUNGS: Clear to auscultation bilaterally.  HEART: Regular rate and rhythm. ABDOMEN: Soft, nontender, nondistended. Gravid  PELVIC: Not performed EXTREMITIES: No cyanosis, clubbing, or edema, 2+ distal pulses.  FHT: baseline 140, mod variability, +accels, no decels Toco: no contractions  Labs: Lab Results  Component Value Date   WBC 9.8 11/21/2022   HGB 11.0 (L) 11/21/2022   HCT 32.7 (L) 11/21/2022   MCV 85.2 11/21/2022   PLT 150 11/21/2022      Latest Ref Rng & Units 09/01/2022   11:49 AM  CMP  Glucose 70 - 99 mg/dL 61   BUN 6 - 20 mg/dL 4   Creatinine 0.57 - 1.00  mg/dL 0.63   Sodium 134 - 144 mmol/L 137   Potassium 3.5 - 5.2 mmol/L 4.2   Chloride 96 - 106 mmol/L 103   CO2 20 - 29 mmol/L 20   Calcium 8.7 - 10.2 mg/dL 9.1   Total Protein 6.0 - 8.5 g/dL 6.5   Total Bilirubin 0.0 - 1.2 mg/dL <0.2   Alkaline Phos 44 - 121 IU/L 38   AST 0 - 40 IU/L 15   ALT 0 - 32 IU/L 9     Discharge instruction: per After Visit Summary and "Baby and Me Booklet".  After visit meds:  Allergies as of 11/23/2022       Reactions   Nortrel 1-35 (21) [norethin-eth Estrad Triphasic] Anaphylaxis   Peach [prunus Persica] Hives, Itching   Rocephin [ceftriaxone] Hives, Itching        Medication List     TAKE these medications    acetaminophen 500 MG tablet Commonly known as: TYLENOL Take 1,000  mg by mouth every 6 (six) hours as needed.   ARIPiprazole 10 MG tablet Commonly known as: ABILIFY Take 1 tablet (10 mg total) by mouth daily.   cyclobenzaprine 10 MG tablet Commonly known as: FLEXERIL Take 1 tablet (10 mg total) by mouth 2 (two) times daily as needed for muscle spasms.   EPINEPHrine 0.3 mg/0.3 mL Soaj injection Commonly known as: EpiPen 2-Pak Inject 0.3 mg into the muscle as needed for anaphylaxis.   PrePLUS 27-1 MG Tabs Take 1 tablet by mouth daily.        Diet: routine diet  Activity: Advance as tolerated. Pelvic rest for 6 weeks.   Outpatient follow up: 11/29/2022 for ROB Follow up Appt: Future Appointments  Date Time Provider [place]  11/25/2022  8:45 AM Candyce Churn B, PT [facility] None  11/29/2022 10:35 AM [place]-Kirby, Kathie Dike, CNM [facility] None  12/08/2022 11:15 AM [name], RN [facility] None  12/10/2022 11:00 AM Mickel Fuchs J [facility] None  01/24/2023  2:30 PM Freida Busman, MD [facility] None      11/23/2022 Mora Bellman, MD

## 2022-11-23 NOTE — Plan of Care (Signed)
  Problem: Education: Goal: Knowledge of disease or condition will improve Outcome: Adequate for Discharge Goal: Knowledge of the prescribed therapeutic regimen will improve Outcome: Adequate for Discharge Goal: Individualized Educational Video(s) Outcome: Adequate for Discharge   Problem: Clinical Measurements: Goal: Complications related to the disease process, condition or treatment will be avoided or minimized Outcome: Adequate for Discharge   Problem: Education: Goal: Knowledge of General Education information will improve Description: Including pain rating scale, medication(s)/side effects and non-pharmacologic comfort measures Outcome: Adequate for Discharge   Problem: Health Behavior/Discharge Planning: Goal: Ability to manage health-related needs will improve Outcome: Adequate for Discharge   Problem: Clinical Measurements: Goal: Ability to maintain clinical measurements within normal limits will improve Outcome: Adequate for Discharge Goal: Will remain free from infection Outcome: Adequate for Discharge Goal: Diagnostic test results will improve Outcome: Adequate for Discharge Goal: Respiratory complications will improve Outcome: Adequate for Discharge Goal: Cardiovascular complication will be avoided Outcome: Adequate for Discharge   Problem: Activity: Goal: Risk for activity intolerance will decrease Outcome: Adequate for Discharge   Problem: Nutrition: Goal: Adequate nutrition will be maintained Outcome: Adequate for Discharge   Problem: Coping: Goal: Level of anxiety will decrease Outcome: Adequate for Discharge   Problem: Elimination: Goal: Will not experience complications related to bowel motility Outcome: Adequate for Discharge Goal: Will not experience complications related to urinary retention Outcome: Adequate for Discharge   Problem: Pain Managment: Goal: General experience of comfort will improve Outcome: Adequate for Discharge   Problem:  Safety: Goal: Ability to remain free from injury will improve Outcome: Adequate for Discharge   Problem: Skin Integrity: Goal: Risk for impaired skin integrity will decrease Outcome: Adequate for Discharge   

## 2022-11-24 ENCOUNTER — Encounter: Payer: Self-pay | Admitting: Obstetrics and Gynecology

## 2022-11-25 ENCOUNTER — Other Ambulatory Visit: Payer: Self-pay

## 2022-11-25 ENCOUNTER — Ambulatory Visit: Payer: Medicaid Other | Attending: Obstetrics and Gynecology

## 2022-11-25 DIAGNOSIS — R252 Cramp and spasm: Secondary | ICD-10-CM | POA: Insufficient documentation

## 2022-11-25 DIAGNOSIS — Z3493 Encounter for supervision of normal pregnancy, unspecified, third trimester: Secondary | ICD-10-CM | POA: Insufficient documentation

## 2022-11-25 DIAGNOSIS — Z3A29 29 weeks gestation of pregnancy: Secondary | ICD-10-CM | POA: Diagnosis not present

## 2022-11-25 DIAGNOSIS — R262 Difficulty in walking, not elsewhere classified: Secondary | ICD-10-CM | POA: Insufficient documentation

## 2022-11-25 DIAGNOSIS — M5459 Other low back pain: Secondary | ICD-10-CM | POA: Diagnosis not present

## 2022-11-25 DIAGNOSIS — M6281 Muscle weakness (generalized): Secondary | ICD-10-CM | POA: Diagnosis not present

## 2022-11-25 DIAGNOSIS — Z3480 Encounter for supervision of other normal pregnancy, unspecified trimester: Secondary | ICD-10-CM | POA: Insufficient documentation
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
## 2022-11-26 ENCOUNTER — Encounter: Payer: Self-pay | Admitting: Certified Nurse Midwife

## 2022-11-26 ENCOUNTER — Ambulatory Visit (INDEPENDENT_AMBULATORY_CARE_PROVIDER_SITE_OTHER): Payer: Medicaid Other | Admitting: Certified Nurse Midwife

## 2022-11-26 ENCOUNTER — Encounter (HOSPITAL_COMMUNITY): Payer: Self-pay | Admitting: Obstetrics and Gynecology

## 2022-11-26 ENCOUNTER — Inpatient Hospital Stay (HOSPITAL_COMMUNITY)
Admission: AD | Admit: 2022-11-26 | Discharge: 2022-11-26 | Disposition: A | Discharge status: Home / Self Care | Payer: Medicaid Other | Attending: Obstetrics and Gynecology | Admitting: Obstetrics and Gynecology

## 2022-11-26 VITALS — BP 110/63 | HR 84 | Wt 186.6 lb

## 2022-11-26 DIAGNOSIS — Z3493 Encounter for supervision of normal pregnancy, unspecified, third trimester: Secondary | ICD-10-CM

## 2022-11-26 DIAGNOSIS — Z3A29 29 weeks gestation of pregnancy: Secondary | ICD-10-CM | POA: Insufficient documentation

## 2022-11-26 DIAGNOSIS — R112 Nausea with vomiting, unspecified: Secondary | ICD-10-CM | POA: Diagnosis not present

## 2022-11-26 DIAGNOSIS — R197 Diarrhea, unspecified: Secondary | ICD-10-CM

## 2022-11-26 DIAGNOSIS — O4700 False labor before 37 completed weeks of gestation, unspecified trimester: Secondary | ICD-10-CM

## 2022-11-26 DIAGNOSIS — O212 Late vomiting of pregnancy: Secondary | ICD-10-CM | POA: Diagnosis not present

## 2022-11-26 LAB — URINALYSIS, ROUTINE W REFLEX MICROSCOPIC
Bilirubin Urine: NEGATIVE
Glucose, UA: NEGATIVE mg/dL
Hgb urine dipstick: NEGATIVE
Ketones, ur: 5 mg/dL — AB
Leukocytes,Ua: NEGATIVE
Nitrite: NEGATIVE
Protein, ur: NEGATIVE mg/dL
Specific Gravity, Urine: 1.029 (ref 1.005–1.030)
pH: 5 (ref 5.0–8.0)

## 2022-11-26 MED ORDER — SCOPOLAMINE 1 MG/3DAYS TD PT72
1.0000 | MEDICATED_PATCH | Freq: Once | TRANSDERMAL | Status: DC
  Administered 2022-11-26: 1.5 mg via TRANSDERMAL
  Filled 2022-11-26: qty 1

## 2022-11-26 MED ORDER — SODIUM CHLORIDE 0.9 % IV SOLN: 25.0000 mg | Freq: Once | INTRAVENOUS | Status: DC

## 2022-11-26 MED ORDER — LACTATED RINGERS IV SOLN
Freq: Once | INTRAVENOUS | Status: AC
  Filled 2022-11-26: qty 1000

## 2022-11-26 MED ORDER — SODIUM CHLORIDE 0.9 % IV SOLN
8.0000 mg | Freq: Once | INTRAVENOUS | Status: AC
  Administered 2022-11-26: 8 mg via INTRAVENOUS
  Filled 2022-11-26: qty 4

## 2022-11-26 NOTE — Progress Notes (Signed)
Pt presents for ROB visit. Pt reports having worsening stomach pain since her fall on 11/21/22. She rates the pain 8/10. Pt has tried Tylenol, but it has not helped with the pain. Denies any leaking of fluid or bleeding. No other concerns at this time.

## 2022-11-26 NOTE — Progress Notes (Signed)
PRENATAL VISIT NOTE  Subjective:  Andrea Jenkins is a 27 y.o. [phone] at [redacted]w[redacted]d being seen today for ongoing prenatal care.  She is currently monitored for the following issues for this low-risk pregnancy and has Alpha thalassemia silent carrier; Anxiety; Bipolar disorder with severe depression ([place]); Fatigue; Overweight (BMI 25.0-29.9); Generalized anxiety disorder; Insomnia due to other mental disorder; Bipolar disorder, current episode mixed, moderate ([place]); Supervision of other normal pregnancy, antepartum; Pica in adults; and Traumatic injury during pregnancy in third trimester on their problem list.  Patient reports contractions since she woke up this morning around 0700. Has not had contractions since she was discharged from the hospital after her fall. Denies any vaginal bleeding/spotting, LOF or DFM .  Contractions: Irregular. Vag. Bleeding: None.  Movement: Present. Denies leaking of fluid.   The following portions of the patient's history were reviewed and updated as appropriate: allergies, current medications, past family history, past medical history, past social history, past surgical history and problem list.   Objective:   Vitals:   11/26/22 0951  BP: 110/63  Pulse: 84  Weight: 186 lb 9.6 oz (84.6 kg)    Fetal Status: Fetal Heart Rate (bpm): 145 Fundal Height: 29 cm Movement: Present     General:  Alert, oriented and cooperative. Patient is in no acute distress.  Skin: Skin is warm and dry. No rash noted.   Cardiovascular: Normal heart rate noted  Respiratory: Normal respiratory effort, no problems with respiration noted  Abdomen: Soft, gravid, appropriate for gestational age.  Pain/Pressure: Present     Pelvic: Cervical exam performed in the presence of a chaperone Dilation: Fingertip Effacement (%): Thick Station: Ballotable  Extremities: Normal range of motion.  Edema: Trace  Mental Status: Normal mood and affect. Normal behavior. Normal judgment and thought  content.   Assessment and Plan:  Pregnancy: G4P3003 at [redacted]w[redacted]d 1. Encounter for supervision of low-risk pregnancy in third trimester - Feeling vigorous fetal movement  2. [redacted] weeks gestation of pregnancy - Routine OB care - 3rd trimester labs completed/normal 10/28/22  3. Preterm contractions - Cervix closed, advised to go home and hydrate, take a bath and see if contractions slow down. Advised to go to MAU If contractions increased/became more painful/any vaginal bleeding.  Preterm labor symptoms and general obstetric precautions including but not limited to vaginal bleeding, contractions, leaking of fluid and fetal movement were reviewed in detail with the patient. Please refer to After Visit Summary for other counseling recommendations.   Return in about 2 weeks (around 12/10/2022) for IN-PERSON, LOB.  Future Appointments  Date Time Provider [place]  11/29/2022 10:15 AM Rudi Heap R, PT [facility] None  12/02/2022  2:00 PM Lynden Ang, PT [facility] None  12/07/2022  8:45 AM Lynden Ang, PT [facility] None  12/08/2022 11:15 AM [name], RN [facility] None  12/09/2022  8:00 AM Candyce Churn B, PT [facility] None  12/10/2022 11:00 AM Mickel Fuchs J [facility] None  12/13/2022  8:55 AM Leftwich-Kirby, Kathie Dike, CNM [facility] None  12/15/2022  8:45 AM Candyce Churn B, PT [facility] None  12/16/2022  8:00 AM Candyce Churn B, PT [facility] None  12/21/2022  8:45 AM Candyce Churn B, PT [facility] None  12/23/2022  8:00 AM Candyce Churn B, PT [facility] None  01/04/2023  8:45 AM Candyce Churn B, PT [facility] None  01/06/2023  8:45 AM Candyce Churn B, PT [facility] None  01/11/2023  8:45 AM Candyce Churn B, PT [facility] None  01/13/2023  8:45  AM Isabel Caprice, PT [facility] None  01/18/2023  8:45 AM Isabel Caprice, PT [facility] None  01/20/2023  8:45 AM Isabel Caprice, PT [facility] None  01/24/2023  2:30 PM Freida Busman, MD [facility] None    Gabriel Carina,  CNM

## 2022-11-26 NOTE — MAU Note (Signed)
Andrea Jenkins is a 27 y.o. at [redacted]w[redacted]d here in MAU reporting: since she woke up, she has been contracting.  Are  every 10-15 min.  Went to office, was closed,  was told to come here if they didn't stop.... so here she is. No vag bleeding, no LOF, reports +FM.  Watery stools all day, "like peeing out of her butthole", nauseated all day.   Onset of complaint: 0600 Pain score: 7  unchanged all day Vitals:   11/26/22 1707  BP: (!) 108/59  Pulse: 90  Resp: 16  Temp: 98.1 F (36.7 C)  SpO2: 100%     FHT:156 Lab orders placed from triage:  urine
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
## 2022-11-29 ENCOUNTER — Encounter: Payer: Medicaid Other | Admitting: Family Medicine

## 2022-11-29 ENCOUNTER — Ambulatory Visit: Payer: Medicaid Other | Admitting: Physical Therapy

## 2022-11-30 ENCOUNTER — Ambulatory Visit: Payer: Medicaid Other

## 2022-12-02 ENCOUNTER — Ambulatory Visit: Payer: Medicaid Other | Admitting: Physical Therapy
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
## 2022-12-07 ENCOUNTER — Telehealth (HOSPITAL_COMMUNITY): Payer: Self-pay | Admitting: Student in an Organized Health Care Education/Training Program

## 2022-12-07 ENCOUNTER — Encounter: Payer: Medicaid Other | Admitting: Physical Therapy

## 2022-12-07 NOTE — Telephone Encounter (Signed)
This encounter was created in error - please disregard.

## 2022-12-08 ENCOUNTER — Ambulatory Visit: Payer: Medicaid Other | Admitting: Physical Therapy

## 2022-12-08 ENCOUNTER — Encounter: Payer: Self-pay | Admitting: *Deleted

## 2022-12-08 ENCOUNTER — Other Ambulatory Visit: Payer: Medicaid Other | Admitting: *Deleted

## 2022-12-08 NOTE — Patient Outreach (Addendum)
Medicaid Managed Care   Nurse Care Manager Note  12/08/2022 Name:  Andrea Jenkins MRN:  [id number] DOB:  January 06, 1996  Andrea Jenkins is an 27 y.o. year old female who is a primary patient of Aldine Contes, MD.  The Medicaid Managed Care Coordination team was consulted for assistance with:    Obstetrics healthcare management needs  Andrea Jenkins was given information about Medicaid Managed Care Coordination team services today. [name] agreed to services and verbal consent obtained.  Engaged with patient by telephone for follow up visit in response to provider referral for case management and/or care coordination services.   Assessments/Interventions:  Review of past medical history, allergies, medications, health status, including review of consultants reports, laboratory and other test data, was performed as part of comprehensive evaluation and provision of chronic care management services.  SDOH (Social Determinants of Health) assessments and interventions performed: SDOH Interventions    Flowsheet Row Patient Outreach Telephone from 12/08/2022 in [place] Patient Outreach Telephone from 11/05/2022 in [place] Patient Outreach Telephone from 10/06/2022 in [place] Patient Outreach Telephone from 09/02/2022 in [place] Patient Outreach Telephone from 07/02/2022 in [place] Patient Outreach Telephone from 04/30/2022 in [place] Interventions        Food Insecurity Interventions Intervention Not Indicated Intervention Not Indicated -- -- -- --  Housing Interventions -- Other (Comment)  [BSW referral] Intervention Not Indicated -- -- --  Transportation Interventions Intervention Not Indicated -- Intervention Not Indicated Intervention Not Indicated --  Intervention Not Indicated  Utilities Interventions -- Intervention Not Indicated -- Intervention Not Indicated -- --  Depression Interventions/Treatment  -- -- -- -- Medication --  Stress Interventions -- -- -- -- [employer], Provide Counseling --       Care Plan  Allergies  Allergen Reactions   Nortrel 1-35 (21) [Norethin-Eth Estrad Triphasic] Anaphylaxis   Peach [Prunus Persica] Hives and Itching   Rocephin [Ceftriaxone] Hives and Itching    Medications Reviewed Today     Reviewed by [name], RN (Registered Nurse) on 12/08/22 at [place] List Status: <None>   Medication Order Taking? Sig Documenting Provider Last Dose Status Informant  acetaminophen (TYLENOL) 500 MG tablet [id number] Yes Take 1,000 mg by mouth every 6 (six) hours as needed. [provider] Taking Active   ARIPiprazole (ABILIFY) 10 MG tablet [id number] Yes Take 1 tablet (10 mg total) by mouth daily. Freida Busman, MD Taking Active   cyclobenzaprine (FLEXERIL) 10 MG tablet [id number] No Take 1 tablet (10 mg total) by mouth 2 (two) times daily as needed for muscle spasms.  Patient not taking: Reported on 12/08/2022   Andrea Jenkins, CNM Not Taking Active   EPINEPHrine (EPIPEN 2-PAK) 0.3 mg/0.3 mL IJ SOAJ injection [id number] Yes Inject 0.3 mg into the muscle as needed for anaphylaxis. Andrea Lack, MD Taking Active   Prenatal Vit-Fe Fumarate-FA (PREPLUS) 27-1 MG TABS [id number] Yes Take 1 tablet by mouth daily. [name], MD Taking Active             Patient Active Problem List   Diagnosis Date Noted   Traumatic injury during pregnancy in third trimester 11/21/2022   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   Supervision of other normal pregnancy, antepartum 08/04/2022   Generalized anxiety disorder 03/26/2022   Insomnia due to other mental  disorder 03/26/2022   Bipolar disorder, current episode mixed, moderate ([place]) 03/26/2022   Fatigue 03/19/2022   Overweight  (BMI 25.0-29.9) 03/19/2022   Bipolar disorder with severe depression ([place])    Anxiety    Alpha thalassemia silent carrier 10/24/2019    Conditions to be addressed/monitored per PCP order:   Obstetrical Health Management  Care Plan : RN Care Manager Plan of Care  Updates made by [name], RN since 12/08/2022 12:00 AM     Problem: Health Management Needs related to Health Maintenance, Anxiety and Depression      Long-Range Goal: Development of Plan of Care to address Health Management Needs related to Health Maintenance, Anxiety and Depression   Start Date: 12/18/2021  Expected End Date: 03/25/2023  Priority: High  Note:   Current Barriers:  Knowledge Deficits related to plan of care for management of Anxiety with Panic Symptoms, and Depression: depressed mood decreased appetite and Health Maintenance -Andrea Jenkins had recent MAU visit and admission. She is attending OB visits and monthly behavioral health visits. 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     RNCM Clinical Goal(s):  Patient will verbalize understanding of plan for management of Anxiety, Depression, and Health Maintenance as evidenced by verbalization of self monitoring activities take all medications exactly as prescribed and will call provider for medication related questions as evidenced by documentation in EMR    work with social worker to address [place] Concerns  related to the management of Anxiety and Depression as evidenced by review of EMR and patient or social worker report     through collaboration with Consulting civil engineer, provider, and care team. -Met Work with Care Guide for food insecurity and needing a dental provider-Met  Interventions: Inter-disciplinary care team collaboration (see longitudinal plan of care) Evaluation of current treatment plan related to  self management and patient's adherence to plan as established by provider Congratulated patient  on job opportunity   Pregnancy :  (Status:  Goal on track:  Yes.)  Long Term Goal Recommended adequate rest     Advised to follow routine screening guidelines as outlined by obstetrics provider    Provided patient with Pregnancy education via [place] upcoming appointments including:12/10/22 with BSW, 12/13/22 for Prenatal Care and [facility] in April  Advised to contact [facility] provider with concerns or questions Assessed for fetal movement, discussed fetal kick count Reviewed medications Reviewed recent MAU visit and admission Referral to BSW for housing resources, patient is temporarily living with family, scheduled on 11/09/22-patient received resources Advised patient to start PT for Sciatica pain, education provided by MyChart   Patient Goals/Self-Care Activities: Take medications as prescribed   Attend church or other social activities Work with the Education officer, museum to address care coordination needs and will continue to work with the clinical team to address health care and disease management related needs call 1-800-273-TALK (toll free, 24 hour hotline) go to [facility] [address], [place] [phone]) call 911 if experiencing a Mental Health or [place]  Schedule new patient appointment with provider of choice       Follow Up:  Patient agrees to Care Plan and Follow-up.  Plan: The Managed Medicaid care management team will reach out to the patient again over the next 30 days.  Date/time of next scheduled RN care management/care coordination outreach:  01/07/23 @ 11:15am  Lurena Joiner RN, [place]  [place]

## 2022-12-08 NOTE — Patient Instructions (Signed)
Visit Information  Andrea Jenkins was given information about Medicaid Managed Care team care coordination services as a part of their Healthy [facility] Medicaid benefit. Andrea Jenkins verbally consented to engagement with the [facility] Managed Care team.   If you are experiencing a medical emergency, please call 911 or report to your local emergency department or urgent care.   If you have a non-emergency medical problem during routine business hours, please contact your provider's office and ask to speak with a nurse.   For questions related to your Healthy [facility] health plan, please call: [phone] or visit the homepage here: [email or web address]  If you would like to schedule transportation through your Healthy [facility] plan, please call the following number at least 2 days in advance of your appointment: [phone]  For information about your ride after you set it up, call Ride Assist at [phone]. Use this number to activate a Will Call pickup, or if your transportation is late for a scheduled pickup. Use this number, too, if you need to make a change or cancel a previously scheduled reservation.  If you need transportation services right away, call [phone]. The after-hours call center is staffed 24 hours to handle ride assistance and urgent reservation requests (including discharges) 365 days a year. Urgent trips include sick visits, hospital discharge requests and life-sustaining treatment.  Call the [place] at [phone], at any time, 24 hours a day, 7 days a week. If you are in danger or need immediate medical attention call 911.  If you would like help to quit smoking, call 1-800-QUIT-NOW 515-127-7633) OR Espaol: 1-855-Djelo-Ya QO:409462) o para ms informacin haga clic aqu or Text READY to 200-400 to register via text  Andrea Jenkins,   Please see education materials related to sciatica provided  by MyChart link.  Patient verbalizes understanding of instructions and care plan provided today and agrees to view in [place]. Active MyChart status and patient understanding of how to access instructions and care plan via MyChart confirmed with patient.     Telephone follow up appointment with Managed Medicaid care management team member scheduled for:01/07/23 @ 11:15am  Andrea Joiner RN, BSN [place] RN Care Coordinator   Following is a copy of your plan of care:  Care Plan : RN Care Manager Plan of Care  Updates made by Andrea Montane, RN since 12/08/2022 12:00 AM     Problem: Health Management Needs related to Health Maintenance, Anxiety and Depression      Long-Range Goal: Development of Plan of Care to address Health Management Needs related to Health Maintenance, Anxiety and Depression   Start Date: 12/18/2021  Expected End Date: 03/25/2023  Priority: High  Note:   Current Barriers:  Knowledge Deficits related to plan of care for management of Anxiety with Panic Symptoms, and Depression: depressed mood decreased appetite and Health Maintenance -Andrea Jenkins had recent MAU visit and admission. She is attending OB visits and monthly behavioral health visits. 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     RNCM Clinical Goal(s):  Patient will verbalize understanding of plan for management of Anxiety, Depression, and Health Maintenance as evidenced by verbalization of self monitoring activities take all medications exactly as prescribed and will call provider for medication related questions as evidenced by documentation in EMR    work with social worker to address Andrea Jenkins Concerns  related to the management of  Anxiety and Depression as evidenced by review of EMR and patient or social worker report     through collaboration with Consulting civil engineer, provider, and care team. -Met Work with Care Guide for food  insecurity and needing a dental provider-Met  Interventions: Inter-disciplinary care team collaboration (see longitudinal plan of care) Evaluation of current treatment plan related to  self management and patient's adherence to plan as established by provider Congratulated patient on job opportunity   Pregnancy :  (Status:  Goal on track:  Yes.)  Long Term Goal Recommended adequate rest     Advised to follow routine screening guidelines as outlined by obstetrics provider    Provided patient with Pregnancy education via [place] upcoming appointments including:12/13/22 for Prenatal Care and [place] in April  Advised to contact [facility] provider with concerns or questions Assessed for fetal movement, discussed fetal kick count Reviewed medications Reviewed recent MAU visit and admission Referral to BSW for housing resources, patient is temporarily living with family, scheduled on 11/09/22-patient received resources Advised patient to start PT for Sciatica pain, education provided by MyChart   Patient Goals/Self-Care Activities: Take medications as prescribed   Attend church or other social activities Work with the Education officer, museum to address care coordination needs and will continue to work with the clinical team to address health care and disease management related needs call 1-800-273-TALK (toll free, 24 hour hotline) go to [facility] [address] [place] [phone]) call 911 if experiencing a Mental Health or [place]  Schedule new patient appointment with provider of choice

## 2022-12-10 ENCOUNTER — Other Ambulatory Visit: Payer: Medicaid Other

## 2022-12-10 NOTE — Patient Outreach (Signed)
Medicaid Managed Care Social Work Note  12/10/2022 Name:  Andrea Jenkins MRN:  [id number] DOB:  05-16-1996  Andrea Jenkins is an 27 y.o. year old female who is a primary patient of Aldine Contes, MD.  The Medicaid Managed Care Coordination team was consulted for assistance with:  Community Resources   Andrea Jenkins was given information about Medicaid Managed Care Coordination team services today. [place] Patient agreed to services and verbal consent obtained.  Engaged with patient  for by telephone forfollow up visit in response to referral for case management and/or care coordination services.   Assessments/Interventions:  Review of past medical history, allergies, medications, health status, including review of consultants reports, laboratory and other test data, was performed as part of comprehensive evaluation and provision of chronic care management services.  SDOH: (Social Determinant of Health) assessments and interventions performed: SDOH Interventions    Flowsheet Row Patient Outreach Telephone from 12/08/2022 in [place] Patient Outreach Telephone from 11/05/2022 in [place] Patient Outreach Telephone from 10/06/2022 in [place] Patient Outreach Telephone from 09/02/2022 in [place] Patient Outreach Telephone from 07/02/2022 in [place] Patient Outreach Telephone from 04/30/2022 in [place] Interventions        Food Insecurity Interventions Intervention Not Indicated Intervention Not Indicated -- -- -- --  Housing Interventions -- Other (Comment)  [BSW referral] Intervention Not Indicated -- -- --  Transportation Interventions Intervention Not Indicated -- Intervention Not Indicated Intervention Not Indicated -- Intervention Not Indicated  Utilities  Interventions -- Intervention Not Indicated -- Intervention Not Indicated -- --  Depression Interventions/Treatment  -- -- -- -- Medication --  Stress Interventions -- -- -- -- [employer], Provide Counseling --      BSW completed a telephone outreach with patient, she states she has contacted some places for housing but has not found anything Patient states she continues to live where she was before. No other resources are needed at this time. Advanced Directives Status:  Not addressed in this encounter.  Care Plan                 Allergies  Allergen Reactions   Nortrel 1-35 (21) [Norethin-Eth Estrad Triphasic] Anaphylaxis   Peach [Prunus Persica] Hives and Itching   Rocephin [Ceftriaxone] Hives and Itching    Medications Reviewed Today     Reviewed by [name], RN (Registered Nurse) on 12/08/22 at [place] List Status: <None>   Medication Order Taking? Sig Documenting Provider Last Dose Status Informant  acetaminophen (TYLENOL) 500 MG tablet [id number] Yes Take 1,000 mg by mouth every 6 (six) hours as needed. [provider] Taking Active   ARIPiprazole (ABILIFY) 10 MG tablet [id number] Yes Take 1 tablet (10 mg total) by mouth daily. Freida Busman, MD Taking Active   cyclobenzaprine (FLEXERIL) 10 MG tablet [id number] No Take 1 tablet (10 mg total) by mouth 2 (two) times daily as needed for muscle spasms.  Patient not taking: Reported on 12/08/2022   [name], CNM Not Taking Active   EPINEPHrine (EPIPEN 2-PAK) 0.3 mg/0.3 mL IJ SOAJ injection [id number] Yes Inject 0.3 mg into the muscle as needed for anaphylaxis. [name], MD Taking Active   Prenatal Vit-Fe Fumarate-FA (PREPLUS) 27-1 MG TABS [id number] Yes Take 1 tablet by mouth daily. [name], MD Taking Active  Patient Active Problem List   Diagnosis Date Noted   Traumatic injury during pregnancy in third trimester 11/21/2022   Pica in adults  08/31/2022   Supervision of other normal pregnancy, antepartum 08/04/2022   Generalized anxiety disorder 03/26/2022   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   Bipolar disorder, current episode mixed, moderate ([place]) 03/26/2022   Fatigue 03/19/2022   Overweight (BMI 25.0-29.9) 03/19/2022   Bipolar disorder with severe depression ([place])    Anxiety    Alpha thalassemia silent carrier 10/24/2019    Conditions to be addressed/monitored per PCP order:   housing resources  There are no care plans that you recently modified to display for this patient.   Follow up:  Patient agrees to Care Plan and Follow-up.  Plan: The Managed Medicaid care management team will reach out to the patient again over the next 30-60 days.  Date/time of next scheduled Social Work care management/care coordination outreach:  02/08/23  Mickel Fuchs, Arita Miss, [place] Medicaid Team  [phone]

## 2022-12-10 NOTE — Patient Instructions (Signed)
Visit Information  Andrea Jenkins was given information about Medicaid Managed Care team care coordination services as a part of their Healthy [facility] Medicaid benefit. Andrea Jenkins verbally consented to engagement with the [facility] Managed Care team.   If you are experiencing a medical emergency, please call 911 or report to your local emergency department or urgent care.   If you have a non-emergency medical problem during routine business hours, please contact your provider's office and ask to speak with a nurse.   For questions related to your Healthy [facility] health plan, please call: [phone] or visit the homepage here: [email or web address]  If you would like to schedule transportation through your Healthy [facility] plan, please call the following number at least 2 days in advance of your appointment: [phone]  For information about your ride after you set it up, call Ride Assist at [phone]. Use this number to activate a Will Call pickup, or if your transportation is late for a scheduled pickup. Use this number, too, if you need to make a change or cancel a previously scheduled reservation.  If you need transportation services right away, call [phone]. The after-hours call center is staffed 24 hours to handle ride assistance and urgent reservation requests (including discharges) 365 days a year. Urgent trips include sick visits, hospital discharge requests and life-sustaining treatment.  Call the [place] at [phone], at any time, 24 hours a day, 7 days a week. If you are in danger or need immediate medical attention call 911.  If you would like help to quit smoking, call 1-800-QUIT-NOW 678-459-2975) OR Espaol: 1-855-Djelo-Ya QO:409462) o para ms informacin haga clic aqu or Text READY to 200-400 to register via text  Andrea Jenkins - following are the goals we discussed in your visit today:    Goals Addressed   None      Social Worker will follow up in 30-60 days.   Mickel Fuchs, BSW, [place] Managed Medicaid Team  [phone]   Following is a copy of your plan of care:  There are no care plans that you recently modified to display for this patient.

## 2022-12-13 ENCOUNTER — Ambulatory Visit (INDEPENDENT_AMBULATORY_CARE_PROVIDER_SITE_OTHER): Payer: Medicaid Other | Admitting: Advanced Practice Midwife

## 2022-12-13 ENCOUNTER — Encounter: Payer: Self-pay | Admitting: Advanced Practice Midwife

## 2022-12-13 VITALS — BP 109/66 | HR 88 | Wt 185.6 lb

## 2022-12-13 DIAGNOSIS — Z3493 Encounter for supervision of normal pregnancy, unspecified, third trimester: Secondary | ICD-10-CM

## 2022-12-13 DIAGNOSIS — Z3A32 32 weeks gestation of pregnancy: Secondary | ICD-10-CM

## 2022-12-13 DIAGNOSIS — F32A Depression, unspecified: Secondary | ICD-10-CM

## 2022-12-13 DIAGNOSIS — O99343 Other mental disorders complicating pregnancy, third trimester: Secondary | ICD-10-CM

## 2022-12-13 NOTE — Progress Notes (Addendum)
   PRENATAL VISIT NOTE  Subjective:  Andrea Jenkins is a 27 y.o. G4P3003 at 23w0dbeing seen today for ongoing prenatal care.  She is currently monitored for the following issues for this low-risk pregnancy and has Alpha thalassemia silent carrier; Anxiety; Bipolar disorder with severe depression ([place]; Fatigue; Overweight (BMI 25.0-29.9); Generalized anxiety disorder; Insomnia due to other mental disorder; Bipolar disorder, current episode mixed, moderate ([place]; Supervision of other normal pregnancy, antepartum; Pica in adults; and Traumatic injury during pregnancy in third trimester on their problem list.  Patient reports occasional contractions.  Contractions: Irritability. Vag. Bleeding: None.  Movement: Present. Denies leaking of fluid.   The following portions of the patient's history were reviewed and updated as appropriate: allergies, current medications, past family history, past medical history, past social history, past surgical history and problem list.   Objective:   Vitals:   12/13/22 0847  BP: 109/66  Pulse: 88  Weight: 185 lb 9.6 oz (84.2 kg)    Fetal Status: Fetal Heart Rate (bpm): 156 Fundal Height: 32 cm Movement: Present     General:  Alert, oriented and cooperative. Patient is in no acute distress.  Skin: Skin is warm and dry. No rash noted.   Cardiovascular: Normal heart rate noted  Respiratory: Normal respiratory effort, no problems with respiration noted  Abdomen: Soft, gravid, appropriate for gestational age.  Pain/Pressure: Present     Pelvic: Cervical exam deferred        Extremities: Normal range of motion.  Edema: Trace  Mental Status: Normal mood and affect. Normal behavior. Normal judgment and thought content.   Assessment and Plan:  Pregnancy: G4P3003 at 347w0d. Encounter for supervision of low-risk pregnancy in third trimester --Anticipatory guidance about next visits/weeks of pregnancy given.  --Vertex position by [place], confirmed at pt  request by bedside [place]. [redacted] weeks gestation of pregnancy   3. Depression affecting pregnancy in third trimester, antepartum --Pt reports she is doing well, can tell a big difference when taking her bipolar meds so she does not miss those.   Preterm labor symptoms and general obstetric precautions including but not limited to vaginal bleeding, contractions, leaking of fluid and fetal movement were reviewed in detail with the patient. Please refer to After Visit Summary for other counseling recommendations.   Return for LOB, Any provider.  Future Appointments  Date Time Provider De[place]ishop2/20/2024 11:45 AM YuRudi Heap, PT [facility] None  12/16/2022 10:15 AM YuLynden AngPT [facility] None  12/21/2022  8:45 AM FiCandyce Churn, PT [facility] None  12/23/2022 10:15 AM Beuhring, JoAlene MiresPT [facility] None  12/27/2022 11:15 AM Leftwich-Kirby, LiKathie DikeCNM [facility] None  01/04/2023  8:45 AM FiCandyce Churn, PT [facility] None  01/06/2023  8:45 AM FiCandyce Churn, PT [facility] None  01/07/2023 11:15 AM RoMelissa MontaneRN [facility] None  01/11/2023  8:45 AM FiIsabel CapricePT [facility] None  01/13/2023  8:45 AM FiCandyce Churn, PT [facility] None  01/18/2023  8:45 AM FiIsabel CapricePT [facility] None  01/20/2023  8:45 AM FiCandyce Churn, PT [facility] None  01/24/2023  2:30 PM McFreida BusmanMD [facility] None  02/08/2023 11:00 AM ShEthelda ChickHL-P[facility] None    LiFatima BlankCNM
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
## 2022-12-13 NOTE — Progress Notes (Signed)
Pt presents for ROB visit. No concerns at this time.

## 2022-12-14 ENCOUNTER — Encounter: Payer: Self-pay | Admitting: Physical Therapy

## 2022-12-14 ENCOUNTER — Ambulatory Visit: Payer: Medicaid Other | Admitting: Physical Therapy

## 2022-12-14 DIAGNOSIS — Z3493 Encounter for supervision of normal pregnancy, unspecified, third trimester: Secondary | ICD-10-CM | POA: Diagnosis not present

## 2022-12-14 DIAGNOSIS — M6281 Muscle weakness (generalized): Secondary | ICD-10-CM

## 2022-12-14 DIAGNOSIS — R262 Difficulty in walking, not elsewhere classified: Secondary | ICD-10-CM

## 2022-12-14 DIAGNOSIS — M5459 Other low back pain: Secondary | ICD-10-CM | POA: Diagnosis not present

## 2022-12-14 DIAGNOSIS — Z3A29 29 weeks gestation of pregnancy: Secondary | ICD-10-CM | POA: Diagnosis not present

## 2022-12-14 DIAGNOSIS — R252 Cramp and spasm: Secondary | ICD-10-CM
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
## 2022-12-16 ENCOUNTER — Ambulatory Visit: Payer: Medicaid Other | Admitting: Physical Therapy

## 2022-12-21 ENCOUNTER — Ambulatory Visit: Payer: Medicaid Other

## 2022-12-23 ENCOUNTER — Telehealth: Payer: Self-pay | Admitting: Physical Therapy

## 2022-12-23 ENCOUNTER — Ambulatory Visit: Payer: Medicaid Other | Admitting: Physical Therapy

## 2022-12-23 NOTE — Telephone Encounter (Signed)
Pt was a no show for PT appointment on 12/23/22 at 10:15am.  PT called and left a VM asking Pt to call our office so we can confirm her next visit.  Erickson Yamashiro, PT 12/23/22 10:37 AM

## 2022-12-27 ENCOUNTER — Ambulatory Visit (INDEPENDENT_AMBULATORY_CARE_PROVIDER_SITE_OTHER): Payer: Medicaid Other | Admitting: Advanced Practice Midwife

## 2022-12-27 VITALS — BP 105/63 | HR 80 | Wt 192.2 lb

## 2022-12-27 DIAGNOSIS — O26893 Other specified pregnancy related conditions, third trimester: Secondary | ICD-10-CM

## 2022-12-27 DIAGNOSIS — O368130 Decreased fetal movements, third trimester, not applicable or unspecified: Secondary | ICD-10-CM

## 2022-12-27 DIAGNOSIS — R102 Pelvic and perineal pain: Secondary | ICD-10-CM

## 2022-12-27 DIAGNOSIS — F32A Depression, unspecified: Secondary | ICD-10-CM

## 2022-12-27 DIAGNOSIS — O99343 Other mental disorders complicating pregnancy, third trimester: Secondary | ICD-10-CM

## 2022-12-27 DIAGNOSIS — Z3A33 33 weeks gestation of pregnancy: Secondary | ICD-10-CM

## 2022-12-27 DIAGNOSIS — Z3A34 34 weeks gestation of pregnancy: Secondary | ICD-10-CM

## 2022-12-27 DIAGNOSIS — Z3493 Encounter for supervision of normal pregnancy, unspecified, third trimester: Secondary | ICD-10-CM
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
## 2022-12-27 NOTE — Progress Notes (Signed)
Pt presents for ROB visit. Pt reports decreased fetal movement for 3 days. Pt also reports pins and needle feeling in her feet. No other concerns at this time.

## 2022-12-28 ENCOUNTER — Telehealth: Payer: Self-pay

## 2022-12-28 NOTE — Telephone Encounter (Signed)
Patient called stating that she is having severe pelvic pain and pressure that started about 10 am this morning. Patient states that she is wearing her maternity support belt with no relief. She states that it is painful to walk. Denies contractions, LOF, or bleeding. She states that she does have decreased fetal movement. She has felt her baby move only twice today.   Patient also complains of having a headache rates pain as a 7/10. Unable to check BP at this time.  Patient states that is unable to leave work at this time, but will go when she gets off at 5 pm.  Advised patient to go as soon as possible.

## 2022-12-30 ENCOUNTER — Encounter (HOSPITAL_COMMUNITY): Payer: Self-pay | Admitting: Obstetrics and Gynecology

## 2022-12-30 ENCOUNTER — Other Ambulatory Visit: Payer: Self-pay

## 2022-12-30 ENCOUNTER — Other Ambulatory Visit: Payer: Self-pay | Admitting: Family Medicine

## 2022-12-30 ENCOUNTER — Inpatient Hospital Stay (HOSPITAL_COMMUNITY)
Admission: AD | Admit: 2022-12-30 | Discharge: 2022-12-30 | Disposition: A | Discharge status: Home / Self Care | Payer: Medicaid Other | Attending: Obstetrics and Gynecology | Admitting: Obstetrics and Gynecology

## 2022-12-30 DIAGNOSIS — O99119 Other diseases of the blood and blood-forming organs and certain disorders involving the immune mechanism complicating pregnancy, unspecified trimester: Secondary | ICD-10-CM | POA: Insufficient documentation

## 2022-12-30 DIAGNOSIS — Z3A34 34 weeks gestation of pregnancy: Secondary | ICD-10-CM | POA: Insufficient documentation

## 2022-12-30 DIAGNOSIS — R350 Frequency of micturition: Secondary | ICD-10-CM | POA: Insufficient documentation

## 2022-12-30 DIAGNOSIS — D6959 Other secondary thrombocytopenia: Secondary | ICD-10-CM | POA: Insufficient documentation

## 2022-12-30 DIAGNOSIS — E876 Hypokalemia: Secondary | ICD-10-CM | POA: Diagnosis not present

## 2022-12-30 DIAGNOSIS — O99283 Endocrine, nutritional and metabolic diseases complicating pregnancy, third trimester: Secondary | ICD-10-CM | POA: Insufficient documentation

## 2022-12-30 DIAGNOSIS — O99113 Other diseases of the blood and blood-forming organs and certain disorders involving the immune mechanism complicating pregnancy, third trimester: Secondary | ICD-10-CM | POA: Insufficient documentation

## 2022-12-30 DIAGNOSIS — D696 Thrombocytopenia, unspecified: Secondary | ICD-10-CM | POA: Insufficient documentation

## 2022-12-30 DIAGNOSIS — O99353 Diseases of the nervous system complicating pregnancy, third trimester: Secondary | ICD-10-CM | POA: Insufficient documentation

## 2022-12-30 DIAGNOSIS — G43009 Migraine without aura, not intractable, without status migrainosus: Secondary | ICD-10-CM | POA: Diagnosis not present

## 2022-12-30 DIAGNOSIS — O368130 Decreased fetal movements, third trimester, not applicable or unspecified: Secondary | ICD-10-CM | POA: Insufficient documentation

## 2022-12-30 DIAGNOSIS — G43001 Migraine without aura, not intractable, with status migrainosus: Secondary | ICD-10-CM

## 2022-12-30 DIAGNOSIS — O212 Late vomiting of pregnancy: Secondary | ICD-10-CM | POA: Diagnosis present

## 2022-12-30 DIAGNOSIS — Z3A36 36 weeks gestation of pregnancy: Secondary | ICD-10-CM

## 2022-12-30 HISTORY — DX: Thrombocytopenia, unspecified: D69.6

## 2022-12-30 LAB — CBC
HCT: 31 % — ABNORMAL LOW (ref 36.0–46.0)
Hemoglobin: 10.2 g/dL — ABNORMAL LOW (ref 12.0–15.0)
MCH: 28.2 pg (ref 26.0–34.0)
MCHC: 32.9 g/dL (ref 30.0–36.0)
MCV: 85.6 fL (ref 80.0–100.0)
Platelets: 129 10*3/uL — ABNORMAL LOW (ref 150–400)
RBC: 3.62 MIL/uL — ABNORMAL LOW (ref 3.87–5.11)
RDW: 13.3 % (ref 11.5–15.5)
WBC: 6.8 10*3/uL (ref 4.0–10.5)
nRBC: 0 % (ref 0.0–0.2)

## 2022-12-30 LAB — COMPREHENSIVE METABOLIC PANEL
ALT: 10 U/L (ref 0–44)
AST: 20 U/L (ref 15–41)
Albumin: 2.7 g/dL — ABNORMAL LOW (ref 3.5–5.0)
Alkaline Phosphatase: 38 U/L (ref 38–126)
Anion gap: 7 (ref 5–15)
BUN: <5 mg/dL — ABNORMAL LOW (ref 6–20)
CO2: 23 mmol/L (ref 22–32)
Calcium: 8.8 mg/dL — ABNORMAL LOW (ref 8.9–10.3)
Chloride: 105 mmol/L (ref 98–111)
Creatinine, Ser: 0.54 mg/dL (ref 0.44–1.00)
GFR, Estimated: >60 mL/min (ref >60)
Glucose, Bld: 94 mg/dL (ref 70–99)
Potassium: 3.4 mmol/L — ABNORMAL LOW (ref 3.5–5.1)
Sodium: 135 mmol/L (ref 135–145)
Total Bilirubin: 0.3 mg/dL (ref 0.3–1.2)
Total Protein: 6 g/dL — ABNORMAL LOW (ref 6.5–8.1)

## 2022-12-30 LAB — URINALYSIS, ROUTINE W REFLEX MICROSCOPIC
Bilirubin Urine: NEGATIVE
Glucose, UA: NEGATIVE mg/dL
Hgb urine dipstick: NEGATIVE
Ketones, ur: NEGATIVE mg/dL
Nitrite: NEGATIVE
Protein, ur: NEGATIVE mg/dL
Specific Gravity, Urine: 1.013 (ref 1.005–1.030)
pH: 7 (ref 5.0–8.0)

## 2022-12-30 LAB — PROTEIN / CREATININE RATIO, URINE
Creatinine, Urine: 120 mg/dL
Protein Creatinine Ratio: 0.09 mg/mg{Cre} (ref 0.00–0.15)
Total Protein, Urine: 11 mg/dL

## 2022-12-30 MED ORDER — METOCLOPRAMIDE HCL 5 MG/ML IJ SOLN
10.0000 mg | Freq: Once | INTRAMUSCULAR | Status: AC
  Administered 2022-12-30: 10 mg via INTRAVENOUS
  Filled 2022-12-30: qty 2

## 2022-12-30 MED ORDER — CYCLOBENZAPRINE HCL 5 MG PO TABS
10.0000 mg | ORAL_TABLET | Freq: Once | ORAL | Status: DC
  Filled 2022-12-30: qty 2

## 2022-12-30 MED ORDER — CAFFEINE 200 MG PO TABS
200.0000 mg | ORAL_TABLET | Freq: Once | ORAL | Status: AC
  Administered 2022-12-30: 200 mg via ORAL
  Filled 2022-12-30: qty 1

## 2022-12-30 MED ORDER — LACTATED RINGERS IV BOLUS
1000.0000 mL | Freq: Once | INTRAVENOUS | Status: AC
  Administered 2022-12-30: 1000 mL via INTRAVENOUS

## 2022-12-30 MED ORDER — POTASSIUM CHLORIDE CRYS ER 20 MEQ PO TBCR: 20.0000 meq | EXTENDED_RELEASE_TABLET | Freq: Two times a day (BID) | ORAL | 0 refills | Status: DC

## 2022-12-30 MED ORDER — POTASSIUM CHLORIDE CRYS ER 20 MEQ PO TBCR
40.0000 meq | EXTENDED_RELEASE_TABLET | Freq: Once | ORAL | Status: DC
  Filled 2022-12-30 (×2): qty 2

## 2022-12-30 NOTE — MAU Note (Signed)
Called [facility] at [phone] requesting for them to run Protein Creatinine add-on. Kim in [employer] requested the requisition to be sent down as they are short staffed and she will run it as soon as she is able.

## 2022-12-30 NOTE — MAU Note (Signed)
Andrea Jenkins is a 27 y.o. at 36w3dhere in MAU reporting: she has had a headache for 3 days, been taking Tylenol without relief.  Reports HA is making vision blurry.  Also reports increased pelvic pressure, states hurts to walk and causing pain in inner thighs.  Reports wears a support belt already. Denies VB or LOF.  Reports +FM, less than usual. LMP: NA Onset of complaint: 3 days Pain score: 5 HA & 8 pelvis Vitals:   12/30/22 1007  BP: (!) 108/58  Pulse: 86  Resp: 18  Temp: 98.3 F (36.8 C)  SpO2: 99%     FHT:145 bpm Lab orders placed from triage:   UA
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
## 2022-12-31 LAB — CULTURE, OB URINE: Culture: NO GROWTH

## 2023-01-03 ENCOUNTER — Encounter: Payer: Self-pay | Admitting: Obstetrics

## 2023-01-03 IMAGING — US US ABDOMEN LIMITED
1 series · 14 of 25 positions shown · non-contrast
Comparison: None

CLINICAL DATA: RIGHT upper quadrant pain for 2 days

EXAM:
ULTRASOUND ABDOMEN LIMITED RIGHT UPPER QUADRANT

[Series 1: us abdomen limited · 14 of 58 slices shown]
[im 1/58]
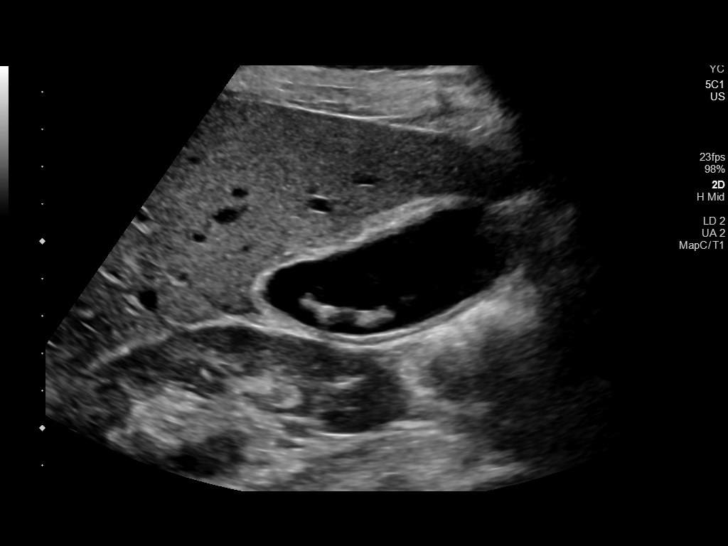
[im 5/58]
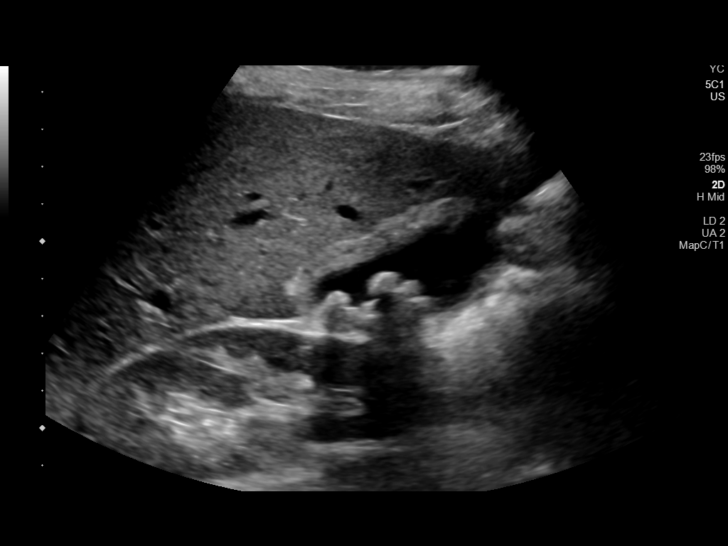
[im 10/58]
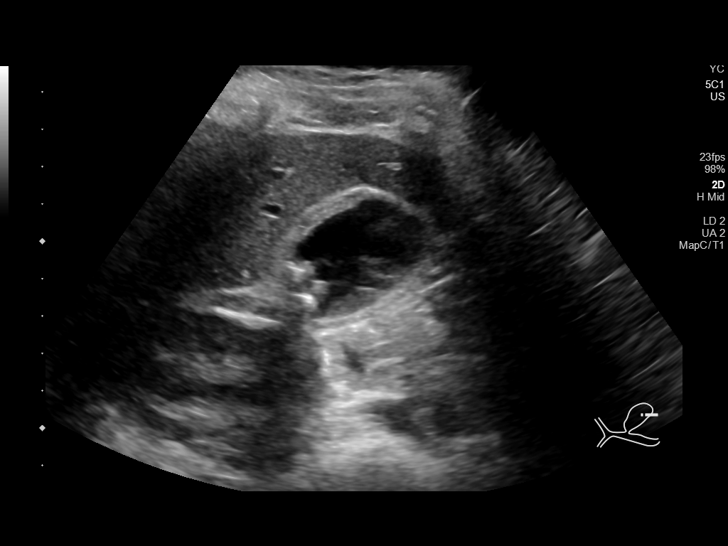
[im 15/58]
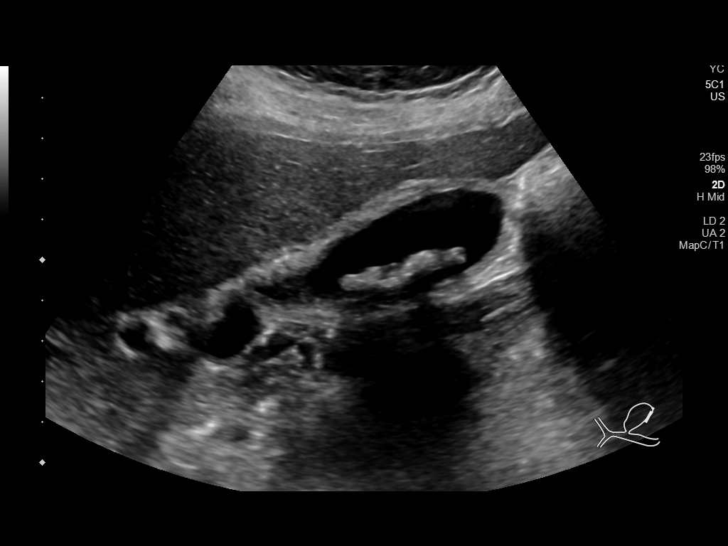
[im 20/58]
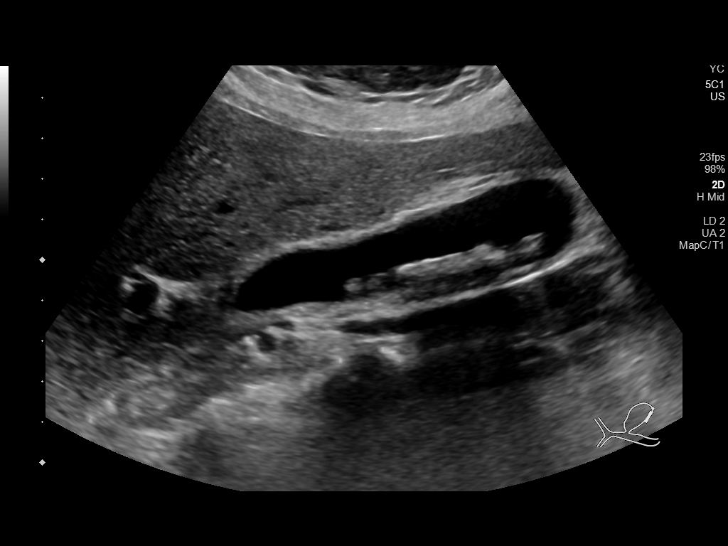
[im 22/58]
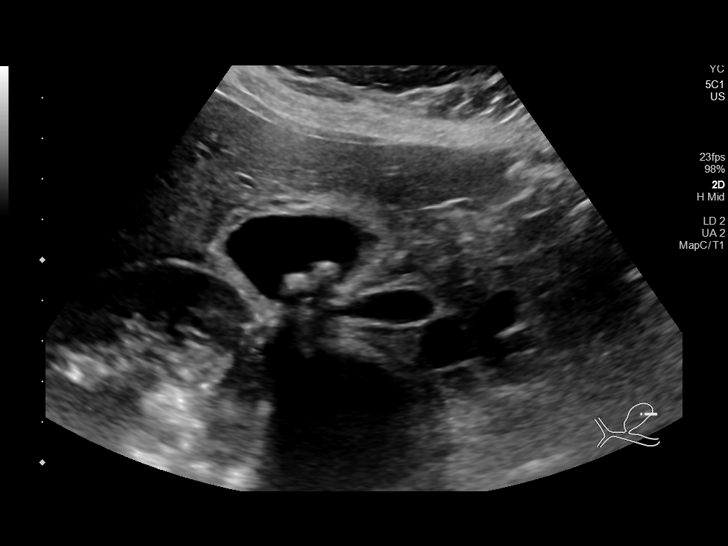
[im 27/58]
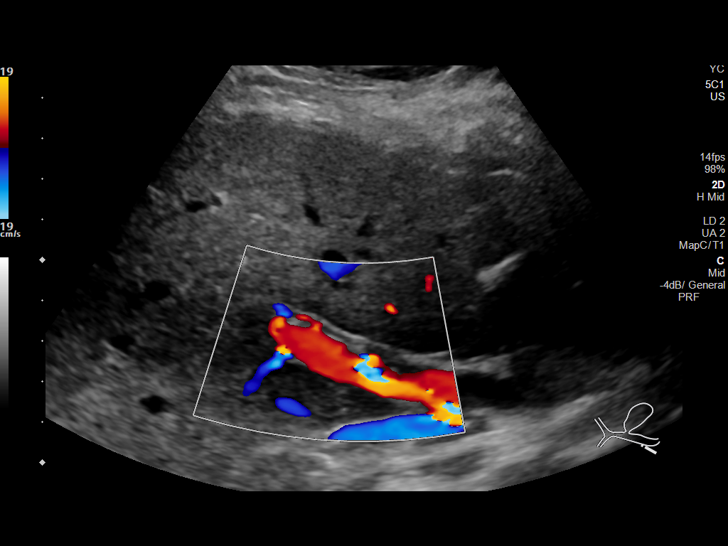
[im 31/58]
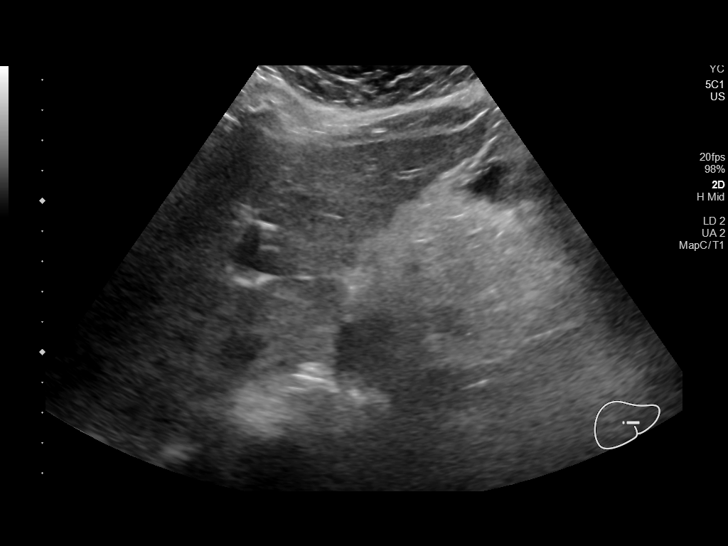
[im 36/58]
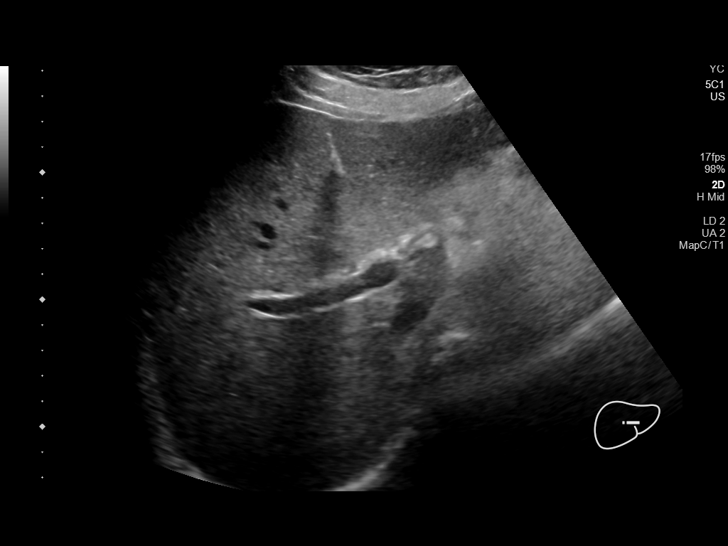
[im 39/58]
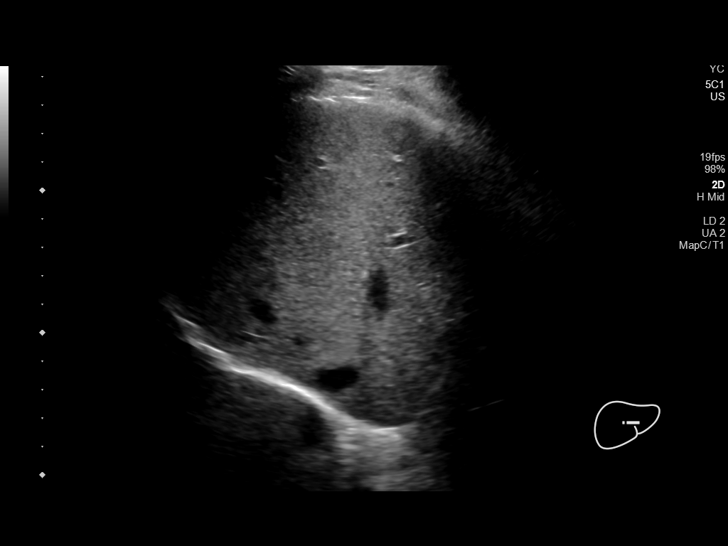
[im 43/58]
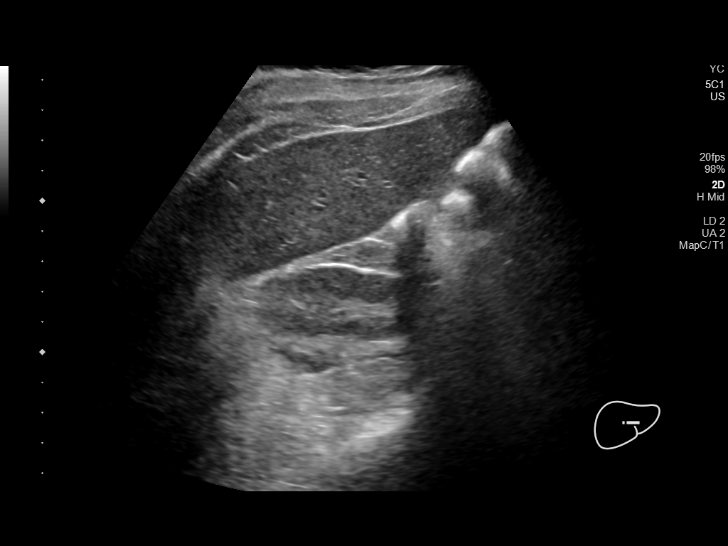
[im 48/58]
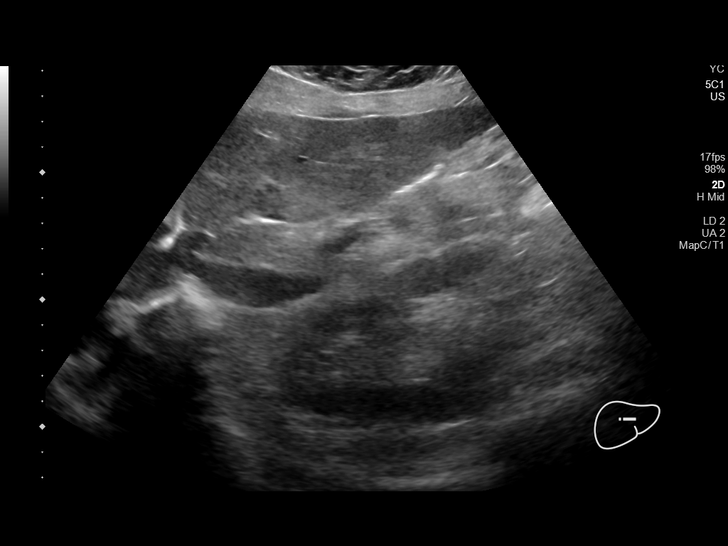
[im 53/58]
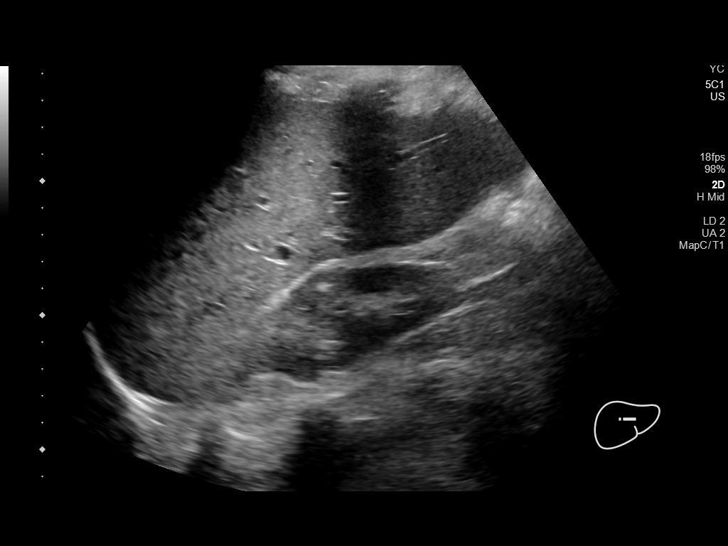
[im 58/58]
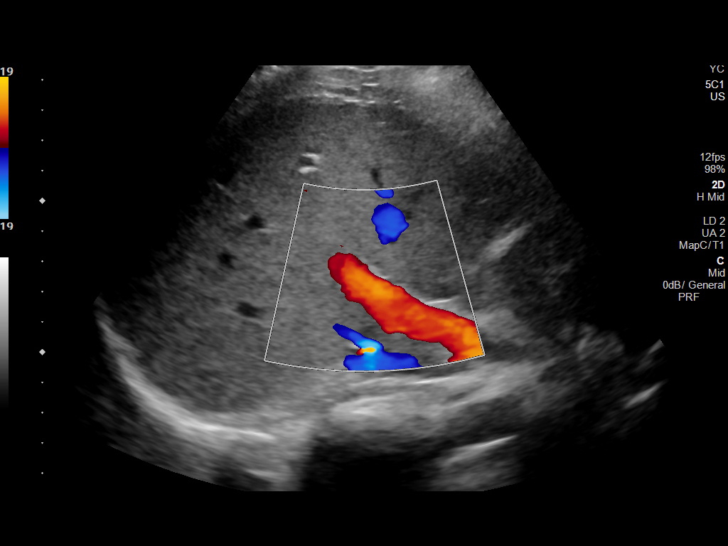

[14 of 25 positions shown; findings below may reference images not displayed]

FINDINGS: Gallbladder:

Dependent calculi within gallbladder up to 11 mm diameter.
Associated gallbladder wall thickening. No pericholecystic fluid or
sonographic Murphy sign.

Common bile duct:

Diameter: 2 mm, normal

Liver:

Normal echogenicity without mass or nodularity. No intrahepatic
biliary dilatation. Portal vein is patent on color Doppler imaging
with normal direction of blood flow towards the liver.

Other: No RIGHT upper quadrant free fluid.
IMPRESSION: Cholelithiasis with associated gallbladder wall thickening, though
no pericholecystic fluid or sonographic Murphy sign are identified
to suggest acute cholecystitis.

If there is persistent clinical concern for acute cholecystitis,
consider radionuclide hepatobiliary imaging.

Remainder of exam unremarkable.

## 2023-01-07 ENCOUNTER — Other Ambulatory Visit: Payer: Medicaid Other | Admitting: *Deleted

## 2023-01-07 ENCOUNTER — Encounter: Payer: Self-pay | Admitting: *Deleted

## 2023-01-07 NOTE — Patient Instructions (Signed)
Visit Information  [name] was given information about Medicaid Managed Care team care coordination services as a part of their Healthy [facility] Medicaid benefit. Andrea Jenkins verbally consented to engagement with the [facility] Managed Care team.   If you are experiencing a medical emergency, please call 911 or report to your local emergency department or urgent care.   If you have a non-emergency medical problem during routine business hours, please contact your provider's office and ask to speak with a nurse.   For questions related to your Healthy [facility] health plan, please call: [phone] or visit the homepage here: [email or web address]  If you would like to schedule transportation through your Healthy [facility] plan, please call the following number at least 2 days in advance of your appointment: [phone]  For information about your ride after you set it up, call Ride Assist at [phone]. Use this number to activate a Will Call pickup, or if your transportation is late for a scheduled pickup. Use this number, too, if you need to make a change or cancel a previously scheduled reservation.  If you need transportation services right away, call [phone]. The after-hours call center is staffed 24 hours to handle ride assistance and urgent reservation requests (including discharges) 365 days a year. Urgent trips include sick visits, hospital discharge requests and life-sustaining treatment.  Call the [place] at [phone], at any time, 24 hours a day, 7 days a week. If you are in danger or need immediate medical attention call 911.  If you would like help to quit smoking, call 1-800-QUIT-NOW 701-286-1955) OR Espaol: 1-855-Djelo-Ya QO:409462) o para ms informacin haga clic aqu or Text READY to 200-400 to register via text  Andrea Jenkins,   Please see education materials related to labor provided by  MyChart link.  Patient verbalizes understanding of instructions and care plan provided today and agrees to view in [place]. Active MyChart status and patient understanding of how to access instructions and care plan via MyChart confirmed with patient.     Telephone follow up appointment with Managed Medicaid care management team member scheduled for:02/09/23 @ 11:15am  Lurena Joiner RN, BSN [place] RN Care Coordinator   Following is a copy of your plan of care:  Care Plan : RN Care Manager Plan of Care  Updates made by [name], RN since 01/07/2023 12:00 AM     Problem: Health Management Needs related to Health Maintenance, Anxiety and Depression      Long-Range Goal: Development of Plan of Care to address Health Management Needs related to Health Maintenance, Anxiety and Depression   Start Date: 12/18/2021  Expected End Date: 03/25/2023  Priority: High  Note:   Current Barriers:  Knowledge Deficits related to plan of care for management of Anxiety with Panic Symptoms, and Depression: depressed mood decreased appetite and Health Maintenance -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 She desires IOL at 39 weeks and has discussed with provider.      RNCM Clinical Goal(s):  Patient will verbalize understanding of plan for management of Anxiety, Depression, and Health Maintenance as evidenced by verbalization of self monitoring activities take all medications exactly as prescribed and will call provider for medication related questions as evidenced by documentation in EMR    work with social worker to address [place] Concerns  related to the management of Anxiety and Depression  as evidenced by review of EMR and patient or social worker report     through collaboration with Consulting civil engineer, provider, and care team. 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   Pregnancy :  (Status:  Goal on track:  Yes.)  Long Term Goal Recommended adequate rest     Advised to follow routine screening guidelines as outlined by obstetrics provider    Provided patient with Pregnancy education via [place] upcoming appointments including:3/19, 3/26 and 01/25/23 with OB provider and 01/20/23 with PT Advised to contact OB provider with concerns or questions Assessed for fetal movement, discussed fetal kick count and labor precautions Reviewed medications-advised patient to pick up potassium from [facility] and take as directed Reviewed recent MAU visit and discussed Advised patient to follow up with Healthy Blue Member services for prenatal benefits   Patient Goals/Self-Care Activities: Take medications as prescribed   Attend church or other social activities Work with the Education officer, museum to address care coordination needs and will continue to work with the clinical team to address health care and disease management related needs call 1-800-273-TALK (toll free, 24 hour hotline) go to [facility] [address], [place] [phone]) call 911 if experiencing a Mental Health or [place]  Schedule new patient appointment with provider of choice

## 2023-01-07 NOTE — Patient Outreach (Addendum)
Medicaid Managed Care   Nurse Care Manager Note  01/07/2023 Name:  Andrea Jenkins MRN:  [id number] DOB:  [birth year]  Andrea Jenkins is an 27 y.o. year old female who is a primary patient of Andrea Contes, MD.  The Medicaid Managed Care Coordination team was consulted for assistance with:    Obstetrics healthcare management needs  Andrea Jenkins was given information about Medicaid Managed Care Coordination team services today. [name] agreed to services and verbal consent obtained.  Engaged with patient by telephone for follow up visit in response to provider referral for case management and/or care coordination services.   Assessments/Interventions:  Review of past medical history, allergies, medications, health status, including review of consultants reports, laboratory and other test data, was performed as part of comprehensive evaluation and provision of chronic care management services.  SDOH (Social Determinants of Health) assessments and interventions performed: SDOH Interventions    Flowsheet Row Patient Outreach Telephone from 01/07/2023 in [name] Outreach Telephone from 12/08/2022 in [name] Outreach Telephone from 11/05/2022 in [name] Outreach Telephone from 10/06/2022 in [name] Outreach Telephone from 09/02/2022 in [name] Outreach Telephone from 07/02/2022 in [name]  SDOH Interventions        Food Insecurity Interventions Intervention Not Indicated Intervention Not Indicated Intervention Not Indicated -- -- --  Housing Interventions Intervention Not Indicated -- Other (Comment)  [BSW referral] Intervention Not Indicated -- --  Transportation Interventions Intervention Not Indicated Intervention Not Indicated --  Intervention Not Indicated Intervention Not Indicated --  Utilities Interventions -- -- Intervention Not Indicated -- Intervention Not Indicated --  Depression Interventions/Treatment  -- -- -- -- -- Medication  Stress Interventions -- -- -- -- -- [employer], Provide Counseling       Care Plan  Allergies  Allergen Reactions   Nortrel 1-35 (21) [Norethin-Eth Estrad Triphasic] Anaphylaxis   Peach [Prunus Persica] Hives and Itching   Rocephin [Ceftriaxone] Hives and Itching    Medications Reviewed Today     Reviewed by Andrea Jenkins, Andrea Jenkins (Registered Nurse) on 01/07/23 at [place] List Status: <None>   Medication Order Taking? Sig Documenting Provider Last Dose Status Informant  ARIPiprazole (ABILIFY) 10 MG tablet [id number] Yes Take 1 tablet (10 mg total) by mouth daily. Freida Busman, MD Taking Active   cyclobenzaprine (FLEXERIL) 10 MG tablet [id number] No Take 1 tablet (10 mg total) by mouth 2 (two) times daily as needed for muscle spasms.  Patient not taking: Reported on 12/08/2022   [name], CNM Not Taking Active   EPINEPHrine (EPIPEN 2-PAK) 0.3 mg/0.3 mL IJ SOAJ injection [id number] No Inject 0.3 mg into the muscle as needed for anaphylaxis.  Patient not taking: Reported on 01/07/2023   [name], MD Not Taking Active   potassium chloride SA (KLOR-CON M) 20 MEQ tablet [id number] No Take 1 tablet (20 mEq total) by mouth 2 (two) times daily.  Patient not taking: Reported on 01/07/2023   Gerlene Fee, DO Not Taking Active   Prenatal Vit-Fe Fumarate-FA (PREPLUS) 27-1 MG TABS [id number] No Take 1 tablet by mouth daily.  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/2022   Supervision of other normal pregnancy, antepartum 08/04/2022   Generalized anxiety disorder 03/26/2022   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   Bipolar disorder, current episode mixed, moderate ([place]) 03/26/2022   Fatigue 03/19/2022   Overweight (BMI 25.0-29.9) 03/19/2022   Bipolar disorder with severe depression ([place])    Anxiety    Alpha thalassemia silent carrier 10/24/2019    Conditions to be addressed/monitored per PCP order:   Obstetrical Health Management needs  Care Plan : Andrea Jenkins Care Manager Plan of Care  Updates made by Andrea Jenkins, Andrea Jenkins since 01/07/2023 12:00 AM     Problem: Health Management Needs related to Health Maintenance, Anxiety and Depression      Long-Range Goal: Development of Plan of Care to address Health Management Needs related to Health Maintenance, Anxiety and Depression   Start Date: 12/18/2021  Expected End Date: 03/25/2023  Priority: High  Note:   Current Barriers:  Knowledge Deficits related to plan of care for management of Anxiety with Panic Symptoms, and Depression: depressed mood decreased appetite and Health Maintenance -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 She desires IOL at 39 weeks and has discussed with provider.      RNCM Clinical Goal(s):  Patient will verbalize understanding of plan for management of Anxiety, Depression, and Health Maintenance as evidenced by verbalization of self monitoring activities take all medications exactly as prescribed and will call provider for medication related questions as evidenced by documentation in EMR    work with social worker to address [place] Concerns  related to the management of Anxiety and Depression as evidenced by review of EMR and patient or social worker report     through collaboration with Consulting civil engineer, provider, and care team. 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   Pregnancy :  (Status:  Goal on track:  Yes.)  Long Term Goal Recommended adequate rest     Advised to follow routine screening guidelines as outlined by obstetrics provider    Provided patient with Pregnancy education via [place] upcoming appointments including:3/19, 3/26 and 01/25/23 with OB provider and 01/20/23 with PT Advised to contact OB provider with concerns or questions Assessed for fetal movement, discussed fetal kick count and labor precautions Reviewed medications-advised patient to pick up potassium from [facility] and take as directed Reviewed recent MAU visit and discussed Advised patient to follow up with Healthy Blue Member services for prenatal benefits   Patient Goals/Self-Care Activities: Take medications as prescribed   Attend church or other social activities Work with the Education officer, museum to address care coordination needs and will continue to work with the clinical team to address health care and disease management related needs call 1-800-273-TALK (toll free, 24 hour hotline) go to [facility] [address], [place] [phone]) call 911 if experiencing a Mental Health or [place]  Schedule new patient appointment with provider of choice       Follow Up:  Patient agrees to Care  Plan and Follow-up.  Plan: The Managed Medicaid care management team will reach out to the patient again over the next 30 days.  Date/time of next scheduled Andrea Jenkins care management/care coordination outreach:  02/09/23 @ 11:15am  Andrea Joiner Andrea Jenkins, BSN [place]  [employer] manager

## 2023-01-11 ENCOUNTER — Ambulatory Visit (INDEPENDENT_AMBULATORY_CARE_PROVIDER_SITE_OTHER): Payer: Medicaid Other | Admitting: Obstetrics and Gynecology

## 2023-01-11 ENCOUNTER — Encounter: Payer: Self-pay | Admitting: Obstetrics and Gynecology

## 2023-01-11 ENCOUNTER — Other Ambulatory Visit (HOSPITAL_COMMUNITY)
Admission: RE | Admit: 2023-01-11 | Discharge: 2023-01-11 | Disposition: A | Discharge status: Home / Self Care | Payer: Medicaid Other | Source: Ambulatory Visit | Attending: Obstetrics and Gynecology | Admitting: Obstetrics and Gynecology

## 2023-01-11 VITALS — BP 110/69 | HR 94 | Wt 193.0 lb

## 2023-01-11 DIAGNOSIS — Z3480 Encounter for supervision of other normal pregnancy, unspecified trimester: Secondary | ICD-10-CM | POA: Insufficient documentation

## 2023-01-11 DIAGNOSIS — O99113 Other diseases of the blood and blood-forming organs and certain disorders involving the immune mechanism complicating pregnancy, third trimester: Secondary | ICD-10-CM

## 2023-01-11 DIAGNOSIS — D696 Thrombocytopenia, unspecified: Secondary | ICD-10-CM

## 2023-01-11 DIAGNOSIS — Z3A36 36 weeks gestation of pregnancy: Secondary | ICD-10-CM

## 2023-01-11 DIAGNOSIS — F314 Bipolar disorder, current episode depressed, severe, without psychotic features: Secondary | ICD-10-CM

## 2023-01-11 NOTE — Progress Notes (Signed)
   PRENATAL VISIT NOTE  Subjective:  Andrea Jenkins is a 27 y.o. [phone] at [redacted]w[redacted]d being seen today for ongoing prenatal care.  She is currently monitored for the following issues for this low-risk pregnancy and has Alpha thalassemia silent carrier; Anxiety; Bipolar disorder with severe depression ([place]); Fatigue; Overweight (BMI 25.0-29.9); Generalized anxiety disorder; Insomnia due to other mental disorder; Bipolar disorder, current episode mixed, moderate ([place]); Supervision of other normal pregnancy, antepartum; Pica in adults; Traumatic injury during pregnancy in third trimester; Gestational thrombocytopenia ([place]); and Hypokalemia on their problem list.  Patient reports no complaints.  Contractions: Not present. Vag. Bleeding: None.  Movement: (!) Decreased. Denies leaking of fluid.   The following portions of the patient's history were reviewed and updated as appropriate: allergies, current medications, past family history, past medical history, past social history, past surgical history and problem list.   Objective:   Vitals:   01/11/23 1105  BP: 110/69  Pulse: 94  Weight: 193 lb (87.5 kg)    Fetal Status: Fetal Heart Rate (bpm): 140 Fundal Height: 37 cm Movement: (!) Decreased     General:  Alert, oriented and cooperative. Patient is in no acute distress.  Skin: Skin is warm and dry. No rash noted.   Cardiovascular: Normal heart rate noted  Respiratory: Normal respiratory effort, no problems with respiration noted  Abdomen: Soft, gravid, appropriate for gestational age.  Pain/Pressure: Present     Pelvic: Cervical exam deferred Dilation: 1 Effacement (%): 30 Station: -2  Extremities: Normal range of motion.  Edema: Trace  Mental Status: Normal mood and affect. Normal behavior. Normal judgment and thought content.   Assessment and Plan:  Pregnancy: G4P3003 at [redacted]w[redacted]d 1. Supervision of other normal pregnancy, antepartum Patient is doing well without complaints Cultures  today Patient with decreased fetal movement NST reviewed and reactive with baseline 140, mod variability, +accels, no decels  2. Benign gestational thrombocytopenia in third trimester ([place])   3. Bipolar disorder with severe depression ([place]) Current stable  Preterm labor symptoms and general obstetric precautions including but not limited to vaginal bleeding, contractions, leaking of fluid and fetal movement were reviewed in detail with the patient. Please refer to After Visit Summary for other counseling recommendations.   Return in about 1 week (around 01/18/2023) for ROB, Low risk, virtual or in person (patient choice).  Future Appointments  Date Time Provider [place]  01/18/2023 10:35 AM Leftwich-Kirby, Kathie Dike, CNM [facility] None  01/20/2023  8:45 AM Isabel Caprice, PT [facility] None  01/24/2023  2:30 PM Freida Busman, MD [facility] None  01/25/2023 10:55 AM Deloris Ping, CNM [facility] None  02/01/2023 10:55 AM Inez Catalina, MD [facility] None  02/07/2023 10:55 AM Leftwich-Kirby, Kathie Dike, CNM [facility] None  02/08/2023 11:00 AM Mickel Fuchs J [facility] None  02/09/2023 11:15 AM [name], RN [facility] None  02/14/2023 12:00 AM [facility] [place] [facility] None    Mora Bellman, MD

## 2023-01-11 NOTE — Progress Notes (Signed)
ROB 36.[redacted] wks GA Reports decreased FM since last visit GBS, GC/CC

## 2023-01-12 LAB — CERVICOVAGINAL ANCILLARY ONLY
Chlamydia: NEGATIVE
Comment: NEGATIVE
Comment: NORMAL
Neisseria Gonorrhea: NEGATIVE

## 2023-01-13 ENCOUNTER — Encounter (HOSPITAL_COMMUNITY): Payer: Self-pay | Admitting: Obstetrics & Gynecology

## 2023-01-13 ENCOUNTER — Inpatient Hospital Stay (HOSPITAL_COMMUNITY)
Admission: AD | Admit: 2023-01-13 | Discharge: 2023-01-13 | Disposition: A | Discharge status: Home / Self Care | Payer: Medicaid Other | Attending: Obstetrics & Gynecology | Admitting: Obstetrics & Gynecology

## 2023-01-13 DIAGNOSIS — R102 Pelvic and perineal pain: Secondary | ICD-10-CM

## 2023-01-13 DIAGNOSIS — Z3689 Encounter for other specified antenatal screening: Secondary | ICD-10-CM | POA: Diagnosis not present

## 2023-01-13 DIAGNOSIS — O479 False labor, unspecified: Secondary | ICD-10-CM

## 2023-01-13 DIAGNOSIS — O26893 Other specified pregnancy related conditions, third trimester: Secondary | ICD-10-CM

## 2023-01-13 DIAGNOSIS — O26899 Other specified pregnancy related conditions, unspecified trimester: Secondary | ICD-10-CM

## 2023-01-13 DIAGNOSIS — Z3A36 36 weeks gestation of pregnancy: Secondary | ICD-10-CM | POA: Diagnosis not present

## 2023-01-13 LAB — URINALYSIS, ROUTINE W REFLEX MICROSCOPIC
Bilirubin Urine: NEGATIVE
Glucose, UA: NEGATIVE mg/dL
Hgb urine dipstick: NEGATIVE
Ketones, ur: NEGATIVE mg/dL
Nitrite: NEGATIVE
Protein, ur: NEGATIVE mg/dL
Specific Gravity, Urine: 1.011 (ref 1.005–1.030)
pH: 6 (ref 5.0–8.0)

## 2023-01-13 LAB — POCT FERN TEST: POCT Fern Test: NEGATIVE

## 2023-01-13 NOTE — MAU Provider Note (Signed)
Patient was assessed for active labor and managed by nursing staff during this encounter. I have reviewed the chart and agree with the documentation and plan. I have also reviewed the NST for appropriate reactivity and added an OB urine culture after reviewing her UA (no urinary s/sx).  Fetal Tracing: reactive  Baseline: 145 Variability: moderate Accelerations: 15x15  Decelerations: none Toco: UI   Patient stable for discharge home with labor precautions.  Gaylan Gerold, CNM, MSN, [place] Certified Nurse Midwife, [facility] 01/13/23 at 11:10 AM

## 2023-01-13 NOTE — MAU Note (Signed)
Pt reports Ctx started Monday and will come and go. Reports gushes of fluid randomly, denies VB. Reports +FM.

## 2023-01-14 LAB — CULTURE, OB URINE: Culture: NO GROWTH

## 2023-01-15 LAB — CULTURE, BETA STREP (GROUP B ONLY): Strep Gp B Culture: NEGATIVE

## 2023-01-17 NOTE — Progress Notes (Unsigned)
   PRENATAL VISIT NOTE  Subjective:  Andrea Jenkins is a 27 y.o. [phone] at [redacted]w[redacted]d being seen today for ongoing prenatal care.  She is currently monitored for the following issues for this {Blank single:19197::"high-risk","low-risk"} pregnancy and has Alpha thalassemia silent carrier; Anxiety; Bipolar disorder with severe depression ([place]); Fatigue; Overweight (BMI 25.0-29.9); Generalized anxiety disorder; Insomnia due to other mental disorder; Bipolar disorder, current episode mixed, moderate ([place]); Supervision of other normal pregnancy, antepartum; Pica in adults; Traumatic injury during pregnancy in third trimester; Gestational thrombocytopenia ([place]); and Hypokalemia on their problem list.  Patient reports {sx:14538}.   .  .   . Denies leaking of fluid.   The following portions of the patient's history were reviewed and updated as appropriate: allergies, current medications, past family history, past medical history, past social history, past surgical history and problem list.   Objective:  There were no vitals filed for this visit.  Fetal Status:           General:  Alert, oriented and cooperative. Patient is in no acute distress.  Skin: Skin is warm and dry. No rash noted.   Cardiovascular: Normal heart rate noted  Respiratory: Normal respiratory effort, no problems with respiration noted  Abdomen: Soft, gravid, appropriate for gestational age.        Pelvic: {Blank single:19197::"Cervical exam performed in the presence of a chaperone","Cervical exam deferred"}        Extremities: Normal range of motion.     Mental Status: Normal mood and affect. Normal behavior. Normal judgment and thought content.   Assessment and Plan:  Pregnancy: G4P3003 at [redacted]w[redacted]d 1. Supervision of other normal pregnancy, antepartum ***  2. Benign gestational thrombocytopenia in third trimester ([place]) *** --Plts 129 on 12/30/22  3. Bipolar disorder with severe depression ([facility]) ***  4. [redacted] weeks gestation of  pregnancy ***  {Blank single:19197::"Term","Preterm"} labor symptoms and general obstetric precautions including but not limited to vaginal bleeding, contractions, leaking of fluid and fetal movement were reviewed in detail with the patient. Please refer to After Visit Summary for other counseling recommendations.   No follow-ups on file.  Future Appointments  Date Time Provider [place]  01/18/2023 10:35 AM Leftwich-Kirby, Kathie Dike, CNM [facility] None  01/20/2023  8:45 AM Isabel Caprice, PT [facility] None  01/24/2023  2:30 PM Freida Busman, MD [facility] None  01/25/2023 10:55 AM Deloris Ping, CNM [facility] None  02/01/2023 10:55 AM Inez Catalina, MD [facility] None  02/07/2023 10:55 AM Leftwich-Kirby, Kathie Dike, CNM [facility] None  02/08/2023 11:00 AM Mickel Fuchs J [facility] None  02/09/2023 11:15 AM [name], RN [facility] None  02/14/2023 12:00 AM [facility] [place] None    Fatima Blank, CNM

## 2023-01-18 ENCOUNTER — Encounter: Payer: Self-pay | Admitting: Advanced Practice Midwife

## 2023-01-18 ENCOUNTER — Ambulatory Visit (INDEPENDENT_AMBULATORY_CARE_PROVIDER_SITE_OTHER): Payer: Medicaid Other | Admitting: Obstetrics and Gynecology

## 2023-01-18 VITALS — BP 112/68 | HR 83 | Wt 193.4 lb

## 2023-01-18 DIAGNOSIS — O99113 Other diseases of the blood and blood-forming organs and certain disorders involving the immune mechanism complicating pregnancy, third trimester: Secondary | ICD-10-CM

## 2023-01-18 DIAGNOSIS — Z3480 Encounter for supervision of other normal pregnancy, unspecified trimester: Secondary | ICD-10-CM

## 2023-01-18 DIAGNOSIS — F314 Bipolar disorder, current episode depressed, severe, without psychotic features: Secondary | ICD-10-CM

## 2023-01-18 DIAGNOSIS — D696 Thrombocytopenia, unspecified: Secondary | ICD-10-CM

## 2023-01-18 DIAGNOSIS — Z3A37 37 weeks gestation of pregnancy: Secondary | ICD-10-CM

## 2023-01-18 NOTE — Progress Notes (Signed)
Pt presents for ROB visit. Denies fetal movement. Pt reports swelling and itching in the legs and feet. No other concerns.

## 2023-01-20 ENCOUNTER — Ambulatory Visit: Payer: Medicaid Other | Attending: Obstetrics and Gynecology

## 2023-01-20 DIAGNOSIS — Z3493 Encounter for supervision of normal pregnancy, unspecified, third trimester: Secondary | ICD-10-CM | POA: Insufficient documentation

## 2023-01-20 DIAGNOSIS — R262 Difficulty in walking, not elsewhere classified: Secondary | ICD-10-CM | POA: Insufficient documentation

## 2023-01-20 DIAGNOSIS — Z3A29 29 weeks gestation of pregnancy: Secondary | ICD-10-CM | POA: Insufficient documentation

## 2023-01-20 DIAGNOSIS — M5459 Other low back pain: Secondary | ICD-10-CM | POA: Insufficient documentation

## 2023-01-20 DIAGNOSIS — M6281 Muscle weakness (generalized): Secondary | ICD-10-CM | POA: Insufficient documentation

## 2023-01-24 ENCOUNTER — Encounter (HOSPITAL_COMMUNITY): Payer: Self-pay | Admitting: Student in an Organized Health Care Education/Training Program

## 2023-01-24 ENCOUNTER — Telehealth (INDEPENDENT_AMBULATORY_CARE_PROVIDER_SITE_OTHER): Payer: Medicaid Other | Admitting: Student in an Organized Health Care Education/Training Program

## 2023-01-24 DIAGNOSIS — F3162 Bipolar disorder, current episode mixed, moderate: Secondary | ICD-10-CM | POA: Diagnosis not present

## 2023-01-24 MED ORDER — ARIPIPRAZOLE 10 MG PO TABS: 10.0000 mg | ORAL_TABLET | Freq: Every day | ORAL | 2 refills | Status: DC
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
## 2023-01-25 ENCOUNTER — Inpatient Hospital Stay (HOSPITAL_BASED_OUTPATIENT_CLINIC_OR_DEPARTMENT_OTHER): Payer: Medicaid Other

## 2023-01-25 ENCOUNTER — Inpatient Hospital Stay (HOSPITAL_COMMUNITY)
Admission: AD | Admit: 2023-01-25 | Discharge: 2023-01-25 | Disposition: A | Discharge status: Home / Self Care | Payer: Medicaid Other | Attending: Obstetrics and Gynecology | Admitting: Obstetrics and Gynecology

## 2023-01-25 ENCOUNTER — Encounter: Payer: Self-pay | Admitting: Certified Nurse Midwife

## 2023-01-25 ENCOUNTER — Encounter (HOSPITAL_COMMUNITY): Payer: Self-pay | Admitting: Obstetrics and Gynecology

## 2023-01-25 ENCOUNTER — Ambulatory Visit (INDEPENDENT_AMBULATORY_CARE_PROVIDER_SITE_OTHER): Payer: Medicaid Other | Admitting: Certified Nurse Midwife

## 2023-01-25 VITALS — BP 118/75 | HR 100 | Wt 193.0 lb

## 2023-01-25 DIAGNOSIS — Z3689 Encounter for other specified antenatal screening: Secondary | ICD-10-CM

## 2023-01-25 DIAGNOSIS — O99343 Other mental disorders complicating pregnancy, third trimester: Secondary | ICD-10-CM | POA: Diagnosis not present

## 2023-01-25 DIAGNOSIS — D563 Thalassemia minor: Secondary | ICD-10-CM | POA: Diagnosis not present

## 2023-01-25 DIAGNOSIS — Z3A38 38 weeks gestation of pregnancy: Secondary | ICD-10-CM

## 2023-01-25 DIAGNOSIS — L299 Pruritus, unspecified: Secondary | ICD-10-CM

## 2023-01-25 DIAGNOSIS — O471 False labor at or after 37 completed weeks of gestation: Secondary | ICD-10-CM | POA: Insufficient documentation

## 2023-01-25 DIAGNOSIS — O368130 Decreased fetal movements, third trimester, not applicable or unspecified: Secondary | ICD-10-CM

## 2023-01-25 DIAGNOSIS — O26893 Other specified pregnancy related conditions, third trimester: Secondary | ICD-10-CM

## 2023-01-25 DIAGNOSIS — R519 Headache, unspecified: Secondary | ICD-10-CM

## 2023-01-25 DIAGNOSIS — O35BXX0 Maternal care for other (suspected) fetal abnormality and damage, fetal cardiac anomalies, not applicable or unspecified: Secondary | ICD-10-CM

## 2023-01-25 DIAGNOSIS — F314 Bipolar disorder, current episode depressed, severe, without psychotic features: Secondary | ICD-10-CM

## 2023-01-25 DIAGNOSIS — Z3480 Encounter for supervision of other normal pregnancy, unspecified trimester: Secondary | ICD-10-CM

## 2023-01-25 DIAGNOSIS — O368330 Maternal care for abnormalities of the fetal heart rate or rhythm, third trimester, not applicable or unspecified: Secondary | ICD-10-CM | POA: Insufficient documentation

## 2023-01-25 DIAGNOSIS — O99113 Other diseases of the blood and blood-forming organs and certain disorders involving the immune mechanism complicating pregnancy, third trimester: Secondary | ICD-10-CM

## 2023-01-25 DIAGNOSIS — O99333 Smoking (tobacco) complicating pregnancy, third trimester: Secondary | ICD-10-CM | POA: Insufficient documentation

## 2023-01-25 DIAGNOSIS — D696 Thrombocytopenia, unspecified: Secondary | ICD-10-CM

## 2023-01-25 DIAGNOSIS — O479 False labor, unspecified: Secondary | ICD-10-CM | POA: Diagnosis not present

## 2023-01-25 LAB — COMPREHENSIVE METABOLIC PANEL
ALT: 10 U/L (ref 0–44)
AST: 20 U/L (ref 15–41)
Albumin: 3 g/dL — ABNORMAL LOW (ref 3.5–5.0)
Alkaline Phosphatase: 44 U/L (ref 38–126)
Anion gap: 10 (ref 5–15)
BUN: <5 mg/dL — ABNORMAL LOW (ref 6–20)
CO2: 20 mmol/L — ABNORMAL LOW (ref 22–32)
Calcium: 8.9 mg/dL (ref 8.9–10.3)
Chloride: 105 mmol/L (ref 98–111)
Creatinine, Ser: 0.6 mg/dL (ref 0.44–1.00)
GFR, Estimated: >60 mL/min (ref >60)
Glucose, Bld: 88 mg/dL (ref 70–99)
Potassium: 3.5 mmol/L (ref 3.5–5.1)
Sodium: 135 mmol/L (ref 135–145)
Total Bilirubin: 0.4 mg/dL (ref 0.3–1.2)
Total Protein: 6.7 g/dL (ref 6.5–8.1)

## 2023-01-25 NOTE — Progress Notes (Signed)
PRENATAL VISIT NOTE  Subjective:  Andrea Jenkins is a 27 y.o. G4P3003 at [redacted]w[redacted]d being seen today for ongoing prenatal care.  She is currently monitored for the following issues for this high-risk pregnancy and has Alpha thalassemia silent carrier; Anxiety; Bipolar disorder with severe depression; Fatigue; Overweight (BMI 25.0-29.9); Generalized anxiety disorder; Insomnia due to other mental disorder; Bipolar disorder, current episode mixed, moderate; Supervision of other normal pregnancy, antepartum; Pica in adults; Traumatic injury during pregnancy in third trimester; Gestational thrombocytopenia; and Hypokalemia on their problem list.  Patient reports headache and itching with some decreased fetal movement. She reports she had a headache on Saturday. She denies attempting to relieve. States she laid down ans it "eased up." She states she has alos "been itching" for the last 2 weeks. She denies having a rash. Itching over abdomen, neck and face. States "itching from the inside out" so bad "she almost used her EpiPen." She also states she is "unaware" if she is feeling movement or not. She states baby only moves once a day. States she forgets to monitor movements.   .  Contractions: Irritability.  .  Movement: Present. Denies leaking of fluid.   The following portions of the patient's history were reviewed and updated as appropriate: allergies, current medications, past family history, past medical history, past social history, past surgical history and problem list.   Objective:   Vitals:   01/25/23 1026  BP: 118/75  Pulse: 100  Weight: 193 lb (87.5 kg)    Fetal Status: Fetal Heart Rate (bpm): 153   Movement: Present     General:  Alert, oriented and cooperative. Patient is in no acute distress.  Skin: Skin is warm and dry. No rash noted.   Cardiovascular: Normal heart rate noted  Respiratory: Normal respiratory effort, no problems with respiration noted  Abdomen: Soft, gravid,  appropriate for gestational age.  Pain/Pressure: Present  Some fetal movement palpated.   Pelvic: Cervical exam deferred        Extremities: Normal range of motion.  Edema: Trace  Mental Status: Normal mood and affect. Normal behavior. Normal judgment and thought content.   Assessment and Plan:  Pregnancy: G4P3003 at [redacted]w[redacted]d 1. Supervision of other normal pregnancy, antepartum - NST in office today d/t DFM.   - Baseline 155bpm with moderate variability, 15x15 x2 present. Variable decels down to 115-120 bpm noted with contractions. Lasting ~10-30secs.   - Toco: Irregular. (Patient unaware)    2. Benign gestational thrombocytopenia in third trimester - CBC to be collected today.   3. [redacted] weeks gestation of pregnancy   4. Bipolar disorder with severe depression - Stable   5. Itching - Given patient being sent to MAU for further evaluation, labs to be collected there.   6. Pregnancy headache in third trimester - Labs to be drawn in MAU.  - No complaints of headache at this time.   7. Decreased fetal movements in third trimester, single or unspecified fetus - CNM palpated some fetal movement in office today. Patient being sent to MAU for further eval.   Term labor symptoms and general obstetric precautions including but not limited to vaginal bleeding, contractions, leaking of fluid and fetal movement were reviewed in detail with the patient. Please refer to After Visit Summary for other counseling recommendations.   Return in about 1 week (around 02/01/2023) for LOB.  Future Appointments  Date Time Provider [place]  02/01/2023 11:15 AM Inez Catalina, MD [facility] None  02/07/2023 10:55 AM  Leftwich-Kirby, Kathie Dike, CNM [facility] None  02/08/2023 11:00 AM Mickel Fuchs J [facility] None  02/09/2023 11:15 AM [name], RN [facility] None  02/14/2023 12:00 AM [facility] [place] [facility] None  03/11/2023 10:00 AM Freida Busman, MD [facility] None   Andrea Jenkins [place], MSN,  [place] for [facility]  01/25/23 12:01 PM
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
## 2023-01-25 NOTE — MAU Note (Signed)
...  Andrea Jenkins is a 27 y.o. at [redacted]w[redacted]d here in MAU reporting: Sent over from office for evaluation of FHR. She reports she was told she was experiencing CTX but she reports she is not feeling them. She reports occasional tightness in her abdomen. She is also endorsing itching on random parts of her body. Endorses "burning" in her nose when asked about a HA. Denies VB or LOF.

## 2023-01-25 NOTE — Progress Notes (Signed)
Pt presents for ROB visit. Pt c/o severe itching and seeing spots. Pt reports severe headache 3 days, not new episodes.

## 2023-01-26 ENCOUNTER — Inpatient Hospital Stay (HOSPITAL_COMMUNITY)
Admission: AD | Admit: 2023-01-26 | Discharge: 2023-01-29 | DRG: 805 | Disposition: A | Discharge status: Home / Self Care | Payer: Medicaid Other | Attending: Obstetrics and Gynecology | Admitting: Obstetrics and Gynecology

## 2023-01-26 ENCOUNTER — Other Ambulatory Visit: Payer: Self-pay | Admitting: Advanced Practice Midwife

## 2023-01-26 ENCOUNTER — Encounter (HOSPITAL_COMMUNITY): Payer: Self-pay | Admitting: Obstetrics & Gynecology

## 2023-01-26 ENCOUNTER — Inpatient Hospital Stay (HOSPITAL_COMMUNITY)
Admission: AD | Admit: 2023-01-26 | Discharge: 2023-01-26 | Disposition: A | Discharge status: Home / Self Care | Payer: Medicaid Other | Source: Home / Self Care | Attending: Obstetrics and Gynecology | Admitting: Obstetrics and Gynecology

## 2023-01-26 ENCOUNTER — Encounter: Payer: Self-pay | Admitting: Advanced Practice Midwife

## 2023-01-26 ENCOUNTER — Encounter (HOSPITAL_COMMUNITY): Payer: Self-pay | Admitting: Obstetrics and Gynecology

## 2023-01-26 ENCOUNTER — Inpatient Hospital Stay (EMERGENCY_DEPARTMENT_HOSPITAL)
Admission: AD | Admit: 2023-01-26 | Discharge: 2023-01-26 | Disposition: A | Discharge status: Home / Self Care | Payer: Medicaid Other | Source: Home / Self Care | Attending: Obstetrics & Gynecology | Admitting: Obstetrics & Gynecology

## 2023-01-26 DIAGNOSIS — O26643 Intrahepatic cholestasis of pregnancy, third trimester: Secondary | ICD-10-CM | POA: Diagnosis not present

## 2023-01-26 DIAGNOSIS — F419 Anxiety disorder, unspecified: Secondary | ICD-10-CM | POA: Diagnosis present

## 2023-01-26 DIAGNOSIS — Z3A38 38 weeks gestation of pregnancy: Secondary | ICD-10-CM

## 2023-01-26 DIAGNOSIS — O99344 Other mental disorders complicating childbirth: Secondary | ICD-10-CM | POA: Diagnosis not present

## 2023-01-26 DIAGNOSIS — Z3689 Encounter for other specified antenatal screening: Secondary | ICD-10-CM

## 2023-01-26 DIAGNOSIS — O26649 Intrahepatic cholestasis of pregnancy, unspecified trimester: Secondary | ICD-10-CM

## 2023-01-26 DIAGNOSIS — O368130 Decreased fetal movements, third trimester, not applicable or unspecified: Secondary | ICD-10-CM | POA: Insufficient documentation

## 2023-01-26 DIAGNOSIS — O26893 Other specified pregnancy related conditions, third trimester: Secondary | ICD-10-CM | POA: Insufficient documentation

## 2023-01-26 DIAGNOSIS — D6959 Other secondary thrombocytopenia: Secondary | ICD-10-CM | POA: Diagnosis present

## 2023-01-26 DIAGNOSIS — O2662 Liver and biliary tract disorders in childbirth: Secondary | ICD-10-CM | POA: Diagnosis not present

## 2023-01-26 DIAGNOSIS — O9912 Other diseases of the blood and blood-forming organs and certain disorders involving the immune mechanism complicating childbirth: Secondary | ICD-10-CM | POA: Diagnosis not present

## 2023-01-26 DIAGNOSIS — Z3493 Encounter for supervision of normal pregnancy, unspecified, third trimester: Secondary | ICD-10-CM

## 2023-01-26 DIAGNOSIS — Z3490 Encounter for supervision of normal pregnancy, unspecified, unspecified trimester: Secondary | ICD-10-CM | POA: Diagnosis present

## 2023-01-26 DIAGNOSIS — O99119 Other diseases of the blood and blood-forming organs and certain disorders involving the immune mechanism complicating pregnancy, unspecified trimester: Secondary | ICD-10-CM | POA: Diagnosis present

## 2023-01-26 DIAGNOSIS — O471 False labor at or after 37 completed weeks of gestation: Secondary | ICD-10-CM | POA: Insufficient documentation

## 2023-01-26 DIAGNOSIS — O479 False labor, unspecified: Secondary | ICD-10-CM

## 2023-01-26 DIAGNOSIS — D563 Thalassemia minor: Secondary | ICD-10-CM | POA: Diagnosis present

## 2023-01-26 DIAGNOSIS — D696 Thrombocytopenia, unspecified: Secondary | ICD-10-CM | POA: Diagnosis present

## 2023-01-26 DIAGNOSIS — Z3480 Encounter for supervision of other normal pregnancy, unspecified trimester: Secondary | ICD-10-CM

## 2023-01-26 DIAGNOSIS — Z148 Genetic carrier of other disease: Secondary | ICD-10-CM | POA: Diagnosis not present

## 2023-01-26 DIAGNOSIS — K831 Obstruction of bile duct: Secondary | ICD-10-CM | POA: Diagnosis present

## 2023-01-26 DIAGNOSIS — Z3A39 39 weeks gestation of pregnancy: Secondary | ICD-10-CM | POA: Insufficient documentation

## 2023-01-26 DIAGNOSIS — O99343 Other mental disorders complicating pregnancy, third trimester: Secondary | ICD-10-CM | POA: Insufficient documentation

## 2023-01-26 DIAGNOSIS — F314 Bipolar disorder, current episode depressed, severe, without psychotic features: Secondary | ICD-10-CM | POA: Diagnosis present

## 2023-01-26 HISTORY — DX: Intrahepatic cholestasis of pregnancy, unspecified trimester: O26.649

## 2023-01-26 LAB — TYPE AND SCREEN
ABO/RH(D): A POS
Antibody Screen: NEGATIVE

## 2023-01-26 LAB — CBC
HCT: 34.5 % — ABNORMAL LOW (ref 36.0–46.0)
Hemoglobin: 11 g/dL — ABNORMAL LOW (ref 12.0–15.0)
MCH: 26.8 pg (ref 26.0–34.0)
MCHC: 31.9 g/dL (ref 30.0–36.0)
MCV: 84.1 fL (ref 80.0–100.0)
Platelets: 148 10*3/uL — ABNORMAL LOW (ref 150–400)
RBC: 4.1 MIL/uL (ref 3.87–5.11)
RDW: 13.9 % (ref 11.5–15.5)
WBC: 9.9 10*3/uL (ref 4.0–10.5)
nRBC: 0 % (ref 0.0–0.2)

## 2023-01-26 LAB — BILE ACIDS, TOTAL: Bile Acids Total: 11.3 umol/L — ABNORMAL HIGH (ref 0.0–10.0)

## 2023-01-26 MED ORDER — LACTATED RINGERS IV SOLN: 500.0000 mL | INTRAVENOUS | Status: DC | PRN

## 2023-01-26 MED ORDER — ARIPIPRAZOLE 10 MG PO TABS
10.0000 mg | ORAL_TABLET | Freq: Every day | ORAL | Status: DC
  Administered 2023-01-27 – 2023-01-29 (×3): 10 mg via ORAL
  Filled 2023-01-26 (×3): qty 1

## 2023-01-26 MED ORDER — LIDOCAINE HCL (PF) 1 % IJ SOLN: 30.0000 mL | INTRAMUSCULAR | Status: DC | PRN

## 2023-01-26 MED ORDER — MISOPROSTOL 25 MCG QUARTER TABLET: 25.0000 ug | ORAL_TABLET | Freq: Once | ORAL | Status: DC

## 2023-01-26 MED ORDER — ONDANSETRON HCL 4 MG/2ML IJ SOLN: 4.0000 mg | Freq: Four times a day (QID) | INTRAMUSCULAR | Status: DC | PRN

## 2023-01-26 MED ORDER — OXYTOCIN BOLUS FROM INFUSION
333.0000 mL | Freq: Once | INTRAVENOUS | Status: AC
  Administered 2023-01-27: 333 mL via INTRAVENOUS

## 2023-01-26 MED ORDER — OXYTOCIN-SODIUM CHLORIDE 30-0.9 UT/500ML-% IV SOLN
2.5000 [IU]/h | INTRAVENOUS | Status: DC
  Filled 2023-01-26: qty 500

## 2023-01-26 MED ORDER — MISOPROSTOL 50MCG HALF TABLET: 50.0000 ug | ORAL_TABLET | Freq: Once | ORAL | Status: DC

## 2023-01-26 MED ORDER — SOD CITRATE-CITRIC ACID 500-334 MG/5ML PO SOLN: 30.0000 mL | ORAL | Status: DC | PRN

## 2023-01-26 MED ORDER — ACETAMINOPHEN 325 MG PO TABS: 650.0000 mg | ORAL_TABLET | ORAL | Status: DC | PRN

## 2023-01-26 MED ORDER — TERBUTALINE SULFATE 1 MG/ML IJ SOLN: 0.2500 mg | Freq: Once | INTRAMUSCULAR | Status: DC | PRN

## 2023-01-26 MED ORDER — LACTATED RINGERS IV SOLN: INTRAVENOUS | Status: DC

## 2023-01-26 MED ORDER — OXYCODONE-ACETAMINOPHEN 5-325 MG PO TABS: 2.0000 | ORAL_TABLET | ORAL | Status: DC | PRN

## 2023-01-26 MED ORDER — FENTANYL CITRATE (PF) 100 MCG/2ML IJ SOLN
100.0000 ug | INTRAMUSCULAR | Status: DC | PRN
  Administered 2023-01-27: 100 ug via INTRAVENOUS
  Filled 2023-01-26: qty 2

## 2023-01-26 MED ORDER — OXYCODONE-ACETAMINOPHEN 5-325 MG PO TABS: 1.0000 | ORAL_TABLET | ORAL | Status: DC | PRN
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
## 2023-01-26 NOTE — MAU Note (Signed)
Pt says she was here Tuesday -sent from Dr office  VE- 2 cm  UC's  [id number] Denies HSV GBS- neg

## 2023-01-26 NOTE — Progress Notes (Signed)
Patient meets criteria for Intrahepatic Cholestasis of Pregnancy. Confirmed with Dr. Elly Modena and coordinated with Barnetta Chapel, L&D Charge RN that patient can come at midnight for IOL. Patient reached at home at 1810. Reviewed diagnosis, indication for induction. Patient confirms she is available. Still reporting painful recurrent contractions and may present to MAU for labor eval prior to scheduled IOL.   Mallie Snooks, MSA, MSN, CNM Certified Nurse Midwife, Water engineer

## 2023-01-26 NOTE — MAU Note (Signed)
Andrea Jenkins is a 27 y.o. at [redacted]w[redacted]d here in MAU reporting: was here earlier this morning, was 2cm.  Feels contractions are closer and stronger. No bleeding or leaking. Baby is not moving.  Feels she is having back labor. Diarrhea started last night too.  Denies vomiting or fever.  Onset of complaint: ongoing, woke her out of her sleep during the night Pain score: 9 There were no vitals filed for this visit.   FHT:146 Lab orders placed from triage:
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
## 2023-01-26 NOTE — MAU Note (Signed)
.  Andrea Jenkins is a 27 y.o. at [redacted]w[redacted]d here in MAU reporting:   Contractions every: 3 minutes Onset of ctx: Today Pain score:  7/10  ROM:  denies leaking of fluid  Vaginal Bleeding:  bloody show  Last SVE: 4 cm  Epidural: desires waterbirth  Fetal Movement: Reports positive FM  FHT:  131  Vitals:   01/26/23 2119  BP: 106/69  Pulse: 83  Resp: 16  Temp: 98.5 F (36.9 C)  SpO2: 100%       OB Office: Faculty GBS: Negative HSV: Denies hx of HSV Lab orders placed from triage: MAU Labor Eval

## 2023-01-27 ENCOUNTER — Encounter (HOSPITAL_COMMUNITY): Payer: Self-pay | Admitting: Family Medicine

## 2023-01-27 ENCOUNTER — Other Ambulatory Visit: Payer: Self-pay

## 2023-01-27 DIAGNOSIS — O9912 Other diseases of the blood and blood-forming organs and certain disorders involving the immune mechanism complicating childbirth: Secondary | ICD-10-CM

## 2023-01-27 DIAGNOSIS — O2662 Liver and biliary tract disorders in childbirth: Secondary | ICD-10-CM

## 2023-01-27 DIAGNOSIS — Z3493 Encounter for supervision of normal pregnancy, unspecified, third trimester: Secondary | ICD-10-CM | POA: Diagnosis not present

## 2023-01-27 DIAGNOSIS — Z3A38 38 weeks gestation of pregnancy: Secondary | ICD-10-CM

## 2023-01-27 DIAGNOSIS — O99344 Other mental disorders complicating childbirth: Secondary | ICD-10-CM

## 2023-01-27 LAB — RPR: RPR Ser Ql: NONREACTIVE

## 2023-01-27 MED ORDER — DIBUCAINE (PERIANAL) 1 % EX OINT: 1.0000 | TOPICAL_OINTMENT | CUTANEOUS | Status: DC | PRN

## 2023-01-27 MED ORDER — BENZOCAINE-MENTHOL 20-0.5 % EX AERO
1.0000 | INHALATION_SPRAY | CUTANEOUS | Status: DC | PRN
  Administered 2023-01-27: 1 via TOPICAL
  Filled 2023-01-27: qty 56

## 2023-01-27 MED ORDER — ACETAMINOPHEN 325 MG PO TABS
650.0000 mg | ORAL_TABLET | ORAL | Status: DC | PRN
  Administered 2023-01-27: 650 mg via ORAL
  Filled 2023-01-27: qty 2

## 2023-01-27 MED ORDER — WITCH HAZEL-GLYCERIN EX PADS: 1.0000 | MEDICATED_PAD | CUTANEOUS | Status: DC | PRN

## 2023-01-27 MED ORDER — ONDANSETRON HCL 4 MG PO TABS: 4.0000 mg | ORAL_TABLET | ORAL | Status: DC | PRN

## 2023-01-27 MED ORDER — SENNOSIDES-DOCUSATE SODIUM 8.6-50 MG PO TABS
2.0000 | ORAL_TABLET | Freq: Every day | ORAL | Status: DC
  Administered 2023-01-27 – 2023-01-28 (×2): 2 via ORAL
  Filled 2023-01-27 (×3): qty 2

## 2023-01-27 MED ORDER — DIPHENHYDRAMINE HCL 25 MG PO CAPS: 25.0000 mg | ORAL_CAPSULE | Freq: Four times a day (QID) | ORAL | Status: DC | PRN

## 2023-01-27 MED ORDER — ZOLPIDEM TARTRATE 5 MG PO TABS: 5.0000 mg | ORAL_TABLET | Freq: Every evening | ORAL | Status: DC | PRN

## 2023-01-27 MED ORDER — IBUPROFEN 600 MG PO TABS
600.0000 mg | ORAL_TABLET | Freq: Four times a day (QID) | ORAL | Status: DC
  Administered 2023-01-27 – 2023-01-29 (×8): 600 mg via ORAL
  Filled 2023-01-27 (×9): qty 1

## 2023-01-27 MED ORDER — ONDANSETRON HCL 4 MG/2ML IJ SOLN: 4.0000 mg | INTRAMUSCULAR | Status: DC | PRN

## 2023-01-27 MED ORDER — PRENATAL MULTIVITAMIN CH
1.0000 | ORAL_TABLET | Freq: Every day | ORAL | Status: DC
  Administered 2023-01-27 – 2023-01-29 (×3): 1 via ORAL
  Filled 2023-01-27 (×3): qty 1

## 2023-01-27 MED ORDER — COCONUT OIL OIL: 1.0000 | TOPICAL_OIL | Status: DC | PRN

## 2023-01-27 MED ORDER — SIMETHICONE 80 MG PO CHEW: 80.0000 mg | CHEWABLE_TABLET | ORAL | Status: DC | PRN

## 2023-01-27 NOTE — Discharge Summary (Addendum)
Postpartum Discharge Summary  Date of Service updated     Patient Name: Andrea Jenkins DOB: October 08, 1996 MRN: [id number]  Date of admission: 01/26/2023 Delivery date:01/27/2023  Delivering provider: Cam Hai D  Date of discharge: 01/28/2023  Admitting diagnosis: [redacted] weeks gestation of pregnancy [Z3A.39] Intrauterine pregnancy: [redacted]w[redacted]d     Secondary diagnosis:  Principal Problem:   Vaginal delivery Active Problems:   Alpha thalassemia silent carrier   Anxiety   Bipolar disorder with severe depression   Supervision of other normal pregnancy, antepartum   Gestational thrombocytopenia   Intrahepatic cholestasis of pregnancy  Additional problems: none    Discharge diagnosis: Term Pregnancy Delivered and ICP; gest thrombocytopenia                                               Post partum procedures: none Augmentation: AROM Complications: None  Hospital course: Induction of Labor With Vaginal Delivery   27 y.o. yo [phone] at [redacted]w[redacted]d was admitted to the hospital 01/26/2023 for induction of labor.  Indication for induction:  ICP .  Patient had an labor course complicated by precipitous active labor/delivery ( from 4cm>delivery, after AROM). Membrane Rupture Time/Date: 11:47 PM ,01/26/2023   Delivery Method:Vaginal, Spontaneous  Episiotomy: None  Lacerations:  None  Details of delivery can be found in separate delivery note.  Patient had a postpartum course that was uncomplicated.  Initially patient was considering discharge; however, she has decide to stay for another day.  Newborn Data: Birth date:01/27/2023  Birth time:12:27 AM  Gender:Female  Living status:Living  Apgars:9 ,10  Weight:3380 g (7lb 7.2oz)  Magnesium Sulfate received: No BMZ received: No Rhophylac:N/A MMR:N/A T-DaP:Given prenatally Flu: No Transfusion:No  Physical exam  Vitals:   01/27/23 1137 01/27/23 1525 01/27/23 2038 01/28/23 0525  BP: (!) 103/57 (!) 112/58 119/69 106/73  Pulse: 72 70 84 69   Resp: 16 16 20 18   Temp: 98.6 F (37 C) 98.1 F (36.7 C) (!) 97.5 F (36.4 C) 98.5 F (36.9 C)  TempSrc: Oral Oral Oral Oral  SpO2: 100%  100% 100%  Weight:      Height:       General: alert, cooperative, and no distress Lochia: appropriate GU: left labia majora with slight edema- no induration, non-tender, no lesions, healing appropriately Uterine Fundus: firm Incision: N/A DVT Evaluation: No evidence of DVT seen on physical exam. Labs: Lab Results  Component Value Date   WBC 9.9 01/26/2023   HGB 11.0 (L) 01/26/2023   HCT 34.5 (L) 01/26/2023   MCV 84.1 01/26/2023   PLT 148 (L) 01/26/2023      Latest Ref Rng & Units 01/25/2023    1:21 PM  CMP  Glucose 70 - 99 mg/dL 88   BUN 6 - 20 mg/dL <5   Creatinine 8.83 - 1.00 mg/dL 2.54   Sodium 982 - 641 mmol/L 135   Potassium 3.5 - 5.1 mmol/L 3.5   Chloride 98 - 111 mmol/L 105   CO2 22 - 32 mmol/L 20   Calcium 8.9 - 10.3 mg/dL 8.9   Total Protein 6.5 - 8.1 g/dL 6.7   Total Bilirubin 0.3 - 1.2 mg/dL 0.4   Alkaline Phos 38 - 126 U/L 44   AST 15 - 41 U/L 20   ALT 0 - 44 U/L 10    Edinburgh Score:    01/27/2023  3:35 AM  Edinburgh Postnatal Depression Scale Screening Tool  I have been able to laugh and see the funny side of things. 0  I have looked forward with enjoyment to things. 0  I have blamed myself unnecessarily when things went wrong. 2  I have been anxious or worried for no good reason. 2  I have felt scared or panicky for no good reason. 0  Things have been getting on top of me. 0  I have been so unhappy that I have had difficulty sleeping. 0  I have felt sad or miserable. 2  I have been so unhappy that I have been crying. 0  The thought of harming myself has occurred to me. 0  Edinburgh Postnatal Depression Scale Total 6     After visit meds:  Allergies as of 01/28/2023       Reactions   Nortrel 1-35 (21) [norethin-eth Estrad Triphasic] Anaphylaxis   Peach [prunus Persica] Hives, Itching   Rocephin  [ceftriaxone] Hives, Itching        Medication List     TAKE these medications    acetaminophen 500 MG tablet Commonly known as: TYLENOL Take 1,000 mg by mouth every 6 (six) hours as needed.   ARIPiprazole 10 MG tablet Commonly known as: ABILIFY Take 1 tablet (10 mg total) by mouth daily.   cyclobenzaprine 10 MG tablet Commonly known as: FLEXERIL Take 1 tablet (10 mg total) by mouth 2 (two) times daily as needed for muscle spasms.   EPINEPHrine 0.3 mg/0.3 mL Soaj injection Commonly known as: EpiPen 2-Pak Inject 0.3 mg into the muscle as needed for anaphylaxis.   ibuprofen 600 MG tablet Commonly known as: ADVIL Take 1 tablet (600 mg total) by mouth every 6 (six) hours.   PrePLUS 27-1 MG Tabs Take 1 tablet by mouth daily.         Discharge home in stable condition Infant Feeding: Bottle Infant Disposition:home with mother Discharge instruction: per After Visit Summary and Postpartum booklet. Activity: Advance as tolerated. Pelvic rest for 6 weeks.  Diet: routine diet Future Appointments: Future Appointments  Date Time Provider Department Center  02/08/2023 11:00 AM Shaune Leeks [facility] None  02/09/2023 11:15 AM Heidi Dach, RN [facility] None  03/11/2023 10:00 AM Bobbye Morton, MD [facility] None  03/16/2023 10:15 AM Brock Bad, MD [facility] None   Follow up Visit:  Arabella Merles, CNM  P [facility] Please schedule this patient for Postpartum visit in: 6 weeks with the following provider: Any provider In-Person For C/S patients schedule nurse incision check in weeks 2 weeks: no High risk pregnancy complicated by: ICP; gest thrombocytopenia Delivery mode:  SVD Anticipated Birth Control:  patch PP Procedures needed: none Schedule Integrated BH visit: no   01/28/2023 Sharon Seller, DO

## 2023-01-27 NOTE — Lactation Note (Signed)
This note was copied from a baby's chart. Lactation Consultation Note  Patient Name: Andrea Jenkins [id number] Date: 01/27/2023 Age:26 hours Reason for consult: L&D Initial assessment;Breastfeeding assistance;Mother's request Birth Parent latched infant on her left breast using the football hold position, infant sustained latch was still breastfeeding after 11 minutes when LC left the room. Birth Parent will continue to BF infant according to hunger cues, on demand, 8 to 12+ times within 24 hours, STS. Birth Parent knows to call [place] for further latch assistance if needed on MBU.   Maternal Data Does the patient have breastfeeding experience prior to this delivery?: Yes How long did the patient breastfeed?: Per Birth Parent, BF 1st child for 2 months, 2nd child 1 month and 3rd only 3 weeks have past hx of low milk supply.  Feeding Mother's Current Feeding Choice: Breast Milk and Formula  LATCH Score Latch: Grasps breast easily, tongue down, lips flanged, rhythmical sucking.  Audible Swallowing: None  Type of Nipple: Everted at rest and after stimulation  Comfort (Breast/Nipple): Soft / non-tender  Hold (Positioning): Assistance needed to correctly position infant at breast and maintain latch.  LATCH Score: 7   Lactation Tools Discussed/Used    Interventions Interventions: Education;Position options;Support pillows;Adjust position;Breast compression;Skin to skin;Assisted with latch  Discharge    Consult Status Consult Status: Follow-up from L&D    Eulis Canner 01/27/2023, 1:34 AM

## 2023-01-27 NOTE — Lactation Note (Signed)
This note was copied from a baby's chart. Lactation Consultation Note  Patient Name: Andrea Jenkins [id number] Date: 01/27/2023 Age:27 hours Reason for consult: Initial assessment;Early term 37-38.6wks  P4, Reviewed hand expression and assisted with latching in football hold.  Baby latched with ease.  Provided mother with manual pump.  Feed on demand with cues.  Goal 8-12+ times per day after first 24 hrs.  Place baby STS if not cueing.  Mom made aware of O/P services, breastfeeding support groups,  and our phone # for post-discharge questions.   Maternal Data Has patient been taught Hand Expression?: Yes Does the patient have breastfeeding experience prior to this delivery?: Yes  Feeding Mother's Current Feeding Choice: Breast Milk and Formula  LATCH Score Latch: Repeated attempts needed to sustain latch, nipple held in mouth throughout feeding, stimulation needed to elicit sucking reflex.  Audible Swallowing: A few with stimulation  Type of Nipple: Everted at rest and after stimulation  Comfort (Breast/Nipple): Soft / non-tender  Hold (Positioning): No assistance needed to correctly position infant at breast.  LATCH Score: 8   Lactation Tools Discussed/Used  Manual pump  Interventions Interventions: Breast feeding basics reviewed;Assisted with latch;Skin to skin;Hand express;Education;LC Services brochure   Consult Status Consult Status: Follow-up Date: 01/28/23 Follow-up type: In-patient    Vivianne Master [facility] 01/27/2023, 9:02 AM

## 2023-01-28 ENCOUNTER — Encounter (HOSPITAL_COMMUNITY): Payer: Self-pay | Admitting: Family Medicine

## 2023-01-28 MED ORDER — IBUPROFEN 600 MG PO TABS: 600.0000 mg | ORAL_TABLET | Freq: Four times a day (QID) | ORAL | 0 refills | Status: DC

## 2023-01-28 NOTE — Clinical Social Work Maternal (Signed)
CLINICAL SOCIAL WORK MATERNAL/CHILD NOTE  Patient Details  Name: Andrea Jenkins MRN: [id number] Date of Birth: 1996/04/03  Date:  01/28/2023  Clinical Social Worker Initiating Note:  Enos Fling Date/Time: Initiated:  01/28/2023  11:30am     Child's Name:  Andrea Jenkins   Biological Parents:  Danie Chandler Camille   Need for Interpreter:  no   Reason for Referral:  anxiety,depression,bipolar   Address:  [address] [place] [address]    Phone number:  [phone] (home)     Additional phone number:   Household Members/Support Persons (HM/SP):     Household Member/Support Person 1, Household Member/Support Person 2, Household Member/Support Person 3,Household Member/Support Person 4   HM/SP Name Relationship DOB or Age  HM/SP -1 Kizzy Mentink MOB's mom    HM/SP -2 [place] Qua daughter 03-10-2021  HM/SP -3 [place] son 01-18-2018  HM/SP -4 [place] daughter 12-04-2019  HM/SP -5        HM/SP -6        HM/SP -7        HM/SP -8          Natural Supports (not living in the home):  Sister   Professional Supports: [facility] [employer] [facility]   Employment: Consulting civil engineer [employer]   Type of Work: Sports coach:  Some college   Homebound arranged:    Surveyor, quantity Resources:  [employer] and foodstamps   Other Resources:      Cultural/Religious Considerations Which May Impact Care:    Strengths:  has all needed items for infant   Psychotropic Medications:  Abilify      Pediatrician:    [facility]  Pediatrician List:   [facility]      Pediatrician Fax Number:    Risk Factors/Current Problems:  anxiety,depression,bipolar   Cognitive State:  Able to Concentrate  , Alert  ,  Goal Oriented  , Insightful  , Linear Thinking   Mood/Affect:  Comfortable  , Relaxed     CSW Assessment: CSW received consult for anxiety,depression and bipolar. CSW met MOB at bedside to complete full psychosocial assessment. CSW entered room and acknowledged that guest were present. CSW asked MOB for privacy reasons could the guest step out for the assessment. MOB was agreeable and the guest stepped out. CSW explained her role and the reason for the visit. MOB presented as calm, was agreeable to consult and remained engaged throughout encounter.  CSW collected MOB's demographic information and inquired about her mental health history. MOB reported being diagnosed with anxiety,depression (2021) and bipolar 2023. MOB reported the anxiety and depression came about after her 1st pregnancy and she had not experienced any signs/symptoms prior to pregnancy. MOB reported taking Zoloft and participating in therapy for support of symptoms. MOB reported her bipolar diagnosis came about while experiencing life difficulties and not able handle to her mood. MOB reported being supported by psychiatrist at [facility] and she is taking Abilify 10mg . MOB reported her bipolar symptoms as maniac; she describes her symptoms as irritability, impulsive and can't sleep; and her last episode was march 2024. MOB reported she participates in counseling session every 3 months, her next appointment is April 17th, 2024 with Dr. Eliseo Gum. MOB reported currently feeling "good" and excited  to longer be pregnant. CSW provided education regarding the baby blues period vs. perinatal mood disorders, discussed treatment and gave resources for mental health follow up if concerns arise.  CSW recommends self-evaluation during the postpartum time period using the New Mom Checklist from Postpartum Progress and encouraged MOB to contact a medical professional if symptoms are noted at any time. CSW assessed for safety with MOB SI/HI/DV;MOB denied all.  CSW assessed for resources needs with MOB. MOB reported she  receiving WIC and Foodstamps. CSW asked MOB has she chosen a pediatrician for infant; MOB said [facility]. MOB reported having all essential items for infant including a car seat and pack n play for safe sleeping. CSW provided review of Sudden Infant Death Syndrome (SIDS) precautions.    CSW Plan/Description:  No Further Intervention Required/No Barriers to Discharge, Sudden Infant Death Syndrome (SIDS) Education.    Barnetta ChapelAshley K Sayvon Arterberry, LCSW 01/28/2023, 12:14 PM

## 2023-01-28 NOTE — Discharge Summary (Signed)
Postpartum Discharge Summary  Date of Service updated     Patient Name: Andrea Jenkins DOB: 02/19/1996 MRN: [id number]  Date of admission: 01/26/2023 Delivery date:01/27/2023  Delivering provider: Cam HaiSHAW, Chelcee Korpi D  Date of discharge: 01/28/2023  Admitting diagnosis: [redacted] weeks gestation of pregnancy [Z3A.39] Intrauterine pregnancy: 4462w3d     Secondary diagnosis:  Principal Problem:   Vaginal delivery Active Problems:   Alpha thalassemia silent carrier   Anxiety   Bipolar disorder with severe depression   Supervision of other normal pregnancy, antepartum   Gestational thrombocytopenia   Intrahepatic cholestasis of pregnancy  Additional problems: none    Discharge diagnosis: Term Pregnancy Delivered and ICP; gest thrombocytopenia                                               Post partum procedures: none Augmentation: AROM Complications: None  Hospital course: Induction of Labor With Vaginal Delivery   27 y.o. yo [phone] at 6862w3d was admitted to the hospital 01/26/2023 for induction of labor.  Indication for induction:  ICP .  Patient had an labor course complicated by precipitous active labor/delivery (40mins from 4cm>delivery, after AROM). Membrane Rupture Time/Date: 11:47 PM ,01/26/2023   Delivery Method:Vaginal, Spontaneous  Episiotomy: None  Lacerations:  None  Details of delivery can be found in separate delivery note.  Patient had a postpartum course that was uncomplicated.  Initially patient was considering discharge; however, she has decide to stay for another day.  Newborn Data: Birth date:01/27/2023  Birth time:12:27 AM  Gender:Female  Living status:Living  Apgars:9 ,10  Weight:3380 g (7lb 7.2oz)  Magnesium Sulfate received: No BMZ received: No Rhophylac:N/A MMR:N/A T-DaP:Given prenatally Flu: No Transfusion:No  Physical exam  Vitals:   01/27/23 1525 01/27/23 2038 01/28/23 0525 01/28/23 1710  BP: (!) 112/58 119/69 106/73 (!) 100/53  Pulse: 70 84 69 73   Resp: 16 20 18 20   Temp: 98.1 F (36.7 C) (!) 97.5 F (36.4 C) 98.5 F (36.9 C) 98.3 F (36.8 C)  TempSrc: Oral Oral Oral Oral  SpO2:  100% 100% 100%  Weight:      Height:       General: alert, cooperative, and no distress Lochia: appropriate GU: left labia majora with slight edema- no induration, non-tender, no lesions, healing appropriately Uterine Fundus: firm Incision: N/A DVT Evaluation: No evidence of DVT seen on physical exam. Labs: Lab Results  Component Value Date   WBC 9.9 01/26/2023   HGB 11.0 (L) 01/26/2023   HCT 34.5 (L) 01/26/2023   MCV 84.1 01/26/2023   PLT 148 (L) 01/26/2023      Latest Ref Rng & Units 01/25/2023    1:21 PM  CMP  Glucose 70 - 99 mg/dL 88   BUN 6 - 20 mg/dL <5   Creatinine 1.470.44 - 1.00 mg/dL 8.290.60   Sodium 562135 - 130145 mmol/L 135   Potassium 3.5 - 5.1 mmol/L 3.5   Chloride 98 - 111 mmol/L 105   CO2 22 - 32 mmol/L 20   Calcium 8.9 - 10.3 mg/dL 8.9   Total Protein 6.5 - 8.1 g/dL 6.7   Total Bilirubin 0.3 - 1.2 mg/dL 0.4   Alkaline Phos 38 - 126 U/L 44   AST 15 - 41 U/L 20   ALT 0 - 44 U/L 10    Edinburgh Score:    01/27/2023  3:35 AM  Edinburgh Postnatal Depression Scale Screening Tool  I have been able to laugh and see the funny side of things. 0  I have looked forward with enjoyment to things. 0  I have blamed myself unnecessarily when things went wrong. 2  I have been anxious or worried for no good reason. 2  I have felt scared or panicky for no good reason. 0  Things have been getting on top of me. 0  I have been so unhappy that I have had difficulty sleeping. 0  I have felt sad or miserable. 2  I have been so unhappy that I have been crying. 0  The thought of harming myself has occurred to me. 0  Edinburgh Postnatal Depression Scale Total 6     After visit meds:  Allergies as of 01/28/2023       Reactions   Nortrel 1-35 (21) [norethin-eth Estrad Triphasic] Anaphylaxis   Peach [prunus Persica] Hives, Itching   Rocephin  [ceftriaxone] Hives, Itching        Medication List     TAKE these medications    acetaminophen 500 MG tablet Commonly known as: TYLENOL Take 1,000 mg by mouth every 6 (six) hours as needed.   ARIPiprazole 10 MG tablet Commonly known as: ABILIFY Take 1 tablet (10 mg total) by mouth daily.   cyclobenzaprine 10 MG tablet Commonly known as: FLEXERIL Take 1 tablet (10 mg total) by mouth 2 (two) times daily as needed for muscle spasms.   EPINEPHrine 0.3 mg/0.3 mL Soaj injection Commonly known as: EpiPen 2-Pak Inject 0.3 mg into the muscle as needed for anaphylaxis.   ibuprofen 600 MG tablet Commonly known as: ADVIL Take 1 tablet (600 mg total) by mouth every 6 (six) hours.   PrePLUS 27-1 MG Tabs Take 1 tablet by mouth daily.         Discharge home in stable condition Infant Feeding: Bottle Infant Disposition:home with mother Discharge instruction: per After Visit Summary and Postpartum booklet. Activity: Advance as tolerated. Pelvic rest for 6 weeks.  Diet: routine diet Future Appointments: Future Appointments  Date Time Provider Department Center  02/08/2023 11:00 AM Shaune Leeks [facility] None  02/09/2023 11:15 AM Heidi Dach, RN [facility] None  03/11/2023 10:00 AM Bobbye Morton, MD [facility] None  03/16/2023 10:15 AM Brock Bad, MD [facility] None   Follow up Visit:  Arabella Merles, CNM  [facility] Please schedule this patient for Postpartum visit in: 6 weeks with the following provider: Any provider In-Person For C/S patients schedule nurse incision check in weeks 2 weeks: no High risk pregnancy complicated by: ICP; gest thrombocytopenia Delivery mode:  SVD Anticipated Birth Control:  patch PP Procedures needed: none Schedule Integrated BH visit: no   01/28/2023 Arabella Merles, CNM

## 2023-01-31 ENCOUNTER — Telehealth: Payer: Self-pay | Admitting: *Deleted

## 2023-01-31 NOTE — Transitions of Care (Post Inpatient/ED Visit) (Signed)
   01/31/2023  Name: Andrea Jenkins MRN: [id number] DOB: 03-05-1996  Transitional Care Management Follow-up Telephone Call  [name] was discharged from [facility] at [facility]), has or will have a pregnancy risk assessment at next visit, has a scheduled  Post Partum  appointment with the [facility] provider on 03/16/23 date, and has been referred for appropriate case management and ongoing follow up.   Estanislado Emms RN, BSN [place]  Managed [facility] RN Care Coordinator [phone]

## 2023-02-01 ENCOUNTER — Encounter: Payer: Medicaid Other | Admitting: Obstetrics and Gynecology

## 2023-02-03 ENCOUNTER — Telehealth (HOSPITAL_COMMUNITY): Payer: Self-pay

## 2023-02-03 NOTE — Telephone Encounter (Signed)
Chart review.

## 2023-02-07 ENCOUNTER — Encounter: Payer: Medicaid Other | Admitting: Advanced Practice Midwife

## 2023-02-07 ENCOUNTER — Telehealth (HOSPITAL_COMMUNITY): Payer: Self-pay | Admitting: *Deleted

## 2023-02-07 NOTE — Telephone Encounter (Signed)
Voicemail not setup. Unable to leave message.  Duffy Rhody, RN 02-07-2023 at 11:56am

## 2023-02-08 ENCOUNTER — Inpatient Hospital Stay (HOSPITAL_COMMUNITY)
Admission: AD | Admit: 2023-02-08 | Discharge: 2023-02-09 | Disposition: A | Discharge status: Home / Self Care | Payer: Medicaid Other | Attending: Obstetrics and Gynecology | Admitting: Obstetrics and Gynecology

## 2023-02-08 ENCOUNTER — Inpatient Hospital Stay (HOSPITAL_COMMUNITY): Payer: Medicaid Other

## 2023-02-08 ENCOUNTER — Encounter (HOSPITAL_COMMUNITY): Payer: Self-pay | Admitting: Obstetrics and Gynecology

## 2023-02-08 ENCOUNTER — Other Ambulatory Visit: Payer: Medicaid Other

## 2023-02-08 DIAGNOSIS — O9089 Other complications of the puerperium, not elsewhere classified: Secondary | ICD-10-CM | POA: Diagnosis not present

## 2023-02-08 DIAGNOSIS — M79601 Pain in right arm: Secondary | ICD-10-CM | POA: Diagnosis not present

## 2023-02-08 DIAGNOSIS — R0602 Shortness of breath: Secondary | ICD-10-CM | POA: Diagnosis not present

## 2023-02-08 DIAGNOSIS — R0789 Other chest pain: Secondary | ICD-10-CM | POA: Insufficient documentation

## 2023-02-08 DIAGNOSIS — R079 Chest pain, unspecified: Secondary | ICD-10-CM | POA: Diagnosis not present

## 2023-02-08 LAB — CBC
HCT: 35 % — ABNORMAL LOW (ref 36.0–46.0)
Hemoglobin: 11.4 g/dL — ABNORMAL LOW (ref 12.0–15.0)
MCH: 27.6 pg (ref 26.0–34.0)
MCHC: 32.6 g/dL (ref 30.0–36.0)
MCV: 84.7 fL (ref 80.0–100.0)
Platelets: 274 10*3/uL (ref 150–400)
RBC: 4.13 MIL/uL (ref 3.87–5.11)
RDW: 13.4 % (ref 11.5–15.5)
WBC: 4.9 10*3/uL (ref 4.0–10.5)
nRBC: 0 % (ref 0.0–0.2)

## 2023-02-08 LAB — D-DIMER, QUANTITATIVE: D-Dimer, Quant: 0.55 ug/mL-FEU — ABNORMAL HIGH (ref 0.00–0.50)

## 2023-02-08 MED ORDER — IBUPROFEN 600 MG PO TABS
600.0000 mg | ORAL_TABLET | Freq: Once | ORAL | Status: AC
  Administered 2023-02-08: 600 mg via ORAL
  Filled 2023-02-08: qty 1

## 2023-02-08 NOTE — Patient Outreach (Signed)
Medicaid Managed Care Social Work Note  02/08/2023 Name:  Andrea Jenkins MRN:  [id number] DOB:  25-Jun-1996  Andrea Jenkins is an 27 y.o. year old female who is a primary patient of Earl Lagos, MD.  The Medicaid Managed Care Coordination team was consulted for assistance with:  Community Resources   Ms. Villard was given information about Medicaid Managed Care Coordination team services today. Andrea Hitch Rhett Patient agreed to services and verbal consent obtained.  Engaged with patient  for by telephone forfollow up visit in response to referral for case management and/or care coordination services.   Assessments/Interventions:  Review of past medical history, allergies, medications, health status, including review of consultants reports, laboratory and other test data, was performed as part of comprehensive evaluation and provision of chronic care management services.  SDOH: (Social Determinant of Health) assessments and interventions performed: SDOH Interventions    Flowsheet Row Patient Outreach Telephone from 01/07/2023 in [place] POPULATION HEALTH DEPARTMENT Patient Outreach Telephone from 12/08/2022 in [place] POPULATION HEALTH DEPARTMENT Patient Outreach Telephone from 11/05/2022 in [place] POPULATION HEALTH DEPARTMENT Patient Outreach Telephone from 10/06/2022 in [place] POPULATION HEALTH DEPARTMENT Patient Outreach Telephone from 09/02/2022 in [place] POPULATION HEALTH DEPARTMENT Patient Outreach Telephone from 07/02/2022 in [facility] Community Care Coordination  SDOH Interventions        Food Insecurity Interventions Intervention Not Indicated Intervention Not Indicated Intervention Not Indicated -- -- --  Housing Interventions Intervention Not Indicated -- Other (Comment)  [BSW referral] Intervention Not Indicated -- --  Transportation Interventions Intervention Not Indicated Intervention Not Indicated -- Intervention Not Indicated  Intervention Not Indicated --  Utilities Interventions -- -- Intervention Not Indicated -- Intervention Not Indicated --  Depression Interventions/Treatment  -- -- -- -- -- Medication  Stress Interventions -- -- -- -- -- [employer], Provide Counseling      BSW completed a telephone outreach with patient, she states she is doing well. No resources are needed at this time. Advanced Directives Status:  Not addressed in this encounter.  Care Plan                 Allergies  Allergen Reactions   Nortrel 1-35 (21) [Norethin-Eth Estrad Triphasic] Anaphylaxis   Peach [Prunus Persica] Hives and Itching   Rocephin [Ceftriaxone] Hives and Itching    Medications Reviewed Today     Reviewed by Spurlock-Frizzell, Job Founds, RN (Registered Nurse) on 01/26/23 at [phone]  Med List Status: <None>   Medication Order Taking? Sig Documenting Provider Last Dose Status Informant  acetaminophen (TYLENOL) 500 MG tablet [id number] Yes Take 1,000 mg by mouth every 6 (six) hours as needed. [provider] 01/26/2023 0200 Active Self  ARIPiprazole (ABILIFY) 10 MG tablet [id number] No Take 1 tablet (10 mg total) by mouth daily. Bobbye Morton, MD 01/24/2023 Active   cyclobenzaprine (FLEXERIL) 10 MG tablet [id number]  Take 1 tablet (10 mg total) by mouth 2 (two) times daily as needed for muscle spasms.  Patient not taking: Reported on 12/08/2022   Calvert Cantor, CNM  Consider Medication Status and Discontinue (No longer needed (for PRN medications))   EPINEPHrine (EPIPEN 2-PAK) 0.3 mg/0.3 mL IJ SOAJ injection [id number]  Inject 0.3 mg into the muscle as needed for anaphylaxis.  Patient not taking: Reported on 01/07/2023   Dimple Nanas, MD  Active   Prenatal Vit-Fe Fumarate-FA (PREPLUS) 27-1 MG TABS [id number]  Take 1 tablet by mouth daily.  Patient not taking: Reported  on 01/18/2023   Hermina Staggers, MD  Consider Medication Status and Discontinue (Patient Preference)              Patient Active Problem List   Diagnosis Date Noted   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   Gestational thrombocytopenia 12/30/2022   Hypokalemia 12/30/2022   Traumatic injury during pregnancy in third trimester 11/21/2022   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   Supervision of other normal pregnancy, antepartum 08/04/2022   Generalized anxiety disorder 03/26/2022   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   Bipolar disorder, current episode mixed, moderate 03/26/2022   Fatigue 03/19/2022   Overweight (BMI 25.0-29.9) 03/19/2022   Bipolar disorder with severe depression    Anxiety    Alpha thalassemia silent carrier 10/24/2019   Vaginal delivery 01/18/2018    Conditions to be addressed/monitored per PCP order:   Noresources needed.  There are no care plans that you recently modified to display for this patient.   Follow up:  Patient agrees to Care Plan and Follow-up.  Plan: The  Patient has been provided with contact information for the Managed Medicaid care management team and has been advised to call with any health related questions or concerns.    Abelino Derrick, MHA [facility]  Managed [facility] Social Worker [phone]

## 2023-02-08 NOTE — Patient Instructions (Signed)
Visit Information  Andrea Jenkins was given information about Medicaid Managed Care team care coordination services as a part of their Healthy [facility] Medicaid benefit. Andrea Jenkins verbally consented to engagement with the [facility] Managed Care team.   If you are experiencing a medical emergency, please call 911 or report to your local emergency department or urgent care.   If you have a non-emergency medical problem during routine business hours, please contact your provider's office and ask to speak with a nurse.   For questions related to your Healthy [facility] health plan, please call: [phone] or visit the homepage here: [email or web address]  If you would like to schedule transportation through your Healthy [facility] plan, please call the following number at least 2 days in advance of your appointment: [phone]  For information about your ride after you set it up, call Ride Assist at [phone]. Use this number to activate a Will Call pickup, or if your transportation is late for a scheduled pickup. Use this number, too, if you need to make a change or cancel a previously scheduled reservation.  If you need transportation services right away, call [phone]. The after-hours call center is staffed 24 hours to handle ride assistance and urgent reservation requests (including discharges) 365 days a year. Urgent trips include sick visits, hospital discharge requests and life-sustaining treatment.  Call the [facility] Line at [phone], at any time, 24 hours a day, 7 days a week. If you are in danger or need immediate medical attention call 911.  If you would like help to quit smoking, call 1-800-QUIT-NOW (680-282-3348) OR Espaol: 1-855-Djelo-Ya (4-742-595-6387) o para ms informacin haga clic aqu or Text READY to 564-332 to register via text  Andrea Jenkins - following are the goals we discussed in your visit today:    Goals Addressed   None     The  Patient                                              has been provided with contact information for the Managed Medicaid care management team and has been advised to call with any health related questions or concerns.  Andrea Jenkins, Andrea Jenkins, MHA [facility]  Managed Medicaid Social Worker [phone]  Following is a copy of your plan of care:  There are no care plans that you recently modified to display for this patient.
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
## 2023-02-08 NOTE — MAU Note (Signed)
.  Andrea Jenkins is a 27 y.o. postpartum here in MAU reporting chest pain since she went home from the hospital. Pain is under R breast and radiates down R arm. Tonight it is hard for her to breathe in which causes a sharp pain.   Onset of complaint: April 6th Pain score: 6 Vitals:   02/08/23 2156  Pulse: 70  Resp: 17  Temp: 98.5 F (36.9 C)  SpO2: 100%     FHT:n/a Lab orders placed from triage:none

## 2023-02-09 ENCOUNTER — Other Ambulatory Visit: Payer: Medicaid Other | Admitting: *Deleted

## 2023-02-09 ENCOUNTER — Inpatient Hospital Stay (HOSPITAL_COMMUNITY): Payer: Medicaid Other

## 2023-02-09 DIAGNOSIS — R0789 Other chest pain: Secondary | ICD-10-CM | POA: Diagnosis not present

## 2023-02-09 DIAGNOSIS — R0602 Shortness of breath: Secondary | ICD-10-CM | POA: Diagnosis not present

## 2023-02-09 LAB — COMPREHENSIVE METABOLIC PANEL
ALT: 16 U/L (ref 0–44)
AST: 20 U/L (ref 15–41)
Albumin: 3.4 g/dL — ABNORMAL LOW (ref 3.5–5.0)
Alkaline Phosphatase: 61 U/L (ref 38–126)
Anion gap: 8 (ref 5–15)
BUN: 7 mg/dL (ref 6–20)
CO2: 26 mmol/L (ref 22–32)
Calcium: 9.2 mg/dL (ref 8.9–10.3)
Chloride: 103 mmol/L (ref 98–111)
Creatinine, Ser: 0.71 mg/dL (ref 0.44–1.00)
GFR, Estimated: >60 mL/min (ref >60)
Glucose, Bld: 89 mg/dL (ref 70–99)
Potassium: 4 mmol/L (ref 3.5–5.1)
Sodium: 137 mmol/L (ref 135–145)
Total Bilirubin: 0.6 mg/dL (ref 0.3–1.2)
Total Protein: 6.8 g/dL (ref 6.5–8.1)

## 2023-02-09 LAB — BRAIN NATRIURETIC PEPTIDE: B Natriuretic Peptide: 3.9 pg/mL (ref 0.0–100.0)

## 2023-02-09 LAB — TROPONIN I (HIGH SENSITIVITY): Troponin I (High Sensitivity): 2 ng/L (ref <18)

## 2023-02-09 MED ORDER — IOHEXOL 350 MG/ML SOLN
75.0000 mL | Freq: Once | INTRAVENOUS | Status: AC | PRN
  Administered 2023-02-09: 75 mL via INTRAVENOUS

## 2023-02-09 MED ORDER — CYCLOBENZAPRINE HCL 10 MG PO TABS: 10.0000 mg | ORAL_TABLET | Freq: Three times a day (TID) | ORAL | 0 refills | Status: DC | PRN

## 2023-02-09 NOTE — Transitions of Care (Post Inpatient/ED Visit) (Signed)
   02/09/2023  Name: Andrea Jenkins MRN: [id number] DOB: 08/03/1996  Today's TOC FU Call Status: Today's TOC FU Call Status:: Successful TOC FU Call Competed TOC FU Call Complete Date: 02/09/23  Transition Care Management Follow-up Telephone Call Date of Discharge: 02/08/23 Discharge Facility[facility]) Type of Discharge: Emergency Department Primary Inpatient Discharge Diagnosis:: Chest wall pain Reason for ED Visit: Other: (chest wall pain) How have you been since you were released from the hospital?: Same Any questions or concerns?: No  Items Reviewed: Did you receive and understand the discharge instructions provided?: Yes Medications obtained and verified?: Yes (Medications Reviewed) Any new allergies since your discharge?: No Dietary orders reviewed?: NA Do you have support at home?: Yes People in Home: sibling(s) Name of Support/Comfort Primary Source: Sister  Home Care and Equipment/Supplies: Were Home Health Services Ordered?: NA Any new equipment or medical supplies ordered?: NA  Functional Questionnaire: Do you need assistance with bathing/showering or dressing?: No Do you need assistance with meal preparation?: No Do you need assistance with eating?: No Do you have difficulty maintaining continence: No Do you need assistance with getting out of bed/getting out of a chair/moving?: No Do you have difficulty managing or taking your medications?: No  Follow up appointments reviewed: Specialist Hospital Follow-up appointment confirmed?: Yes Date of Specialist follow-up appointment?: 03/16/23 Follow-Up Specialty Provider:: OB/GYN Do you need transportation to your follow-up appointment?: No Do you understand care options if your condition(s) worsen?: Yes-patient verbalized understanding  SDOH Interventions Today    Flowsheet Row Most Recent Value  SDOH Interventions   Transportation Interventions Intervention Not Indicated       Estanislado Emms RN,  BSN [place]  Managed [facility] RN Care Coordinator [phone]

## 2023-02-10 ENCOUNTER — Telehealth: Payer: Self-pay | Admitting: Emergency Medicine

## 2023-02-10 NOTE — Telephone Encounter (Signed)
Attempted TC x2 to patient regarding afterhours nurse note. Unable to contact, or LVM.

## 2023-02-14 ENCOUNTER — Encounter (HOSPITAL_COMMUNITY): Payer: Medicaid Other

## 2023-02-15 ENCOUNTER — Ambulatory Visit (INDEPENDENT_AMBULATORY_CARE_PROVIDER_SITE_OTHER): Payer: Medicaid Other | Admitting: *Deleted

## 2023-02-15 ENCOUNTER — Other Ambulatory Visit (HOSPITAL_COMMUNITY)
Admission: RE | Admit: 2023-02-15 | Discharge: 2023-02-15 | Disposition: A | Discharge status: Home / Self Care | Payer: Medicaid Other | Source: Ambulatory Visit | Attending: Obstetrics | Admitting: Obstetrics

## 2023-02-15 VITALS — BP 116/76 | HR 85 | Temp 97.8°F

## 2023-02-15 DIAGNOSIS — N898 Other specified noninflammatory disorders of vagina: Secondary | ICD-10-CM | POA: Diagnosis not present

## 2023-02-15 NOTE — Progress Notes (Signed)
SUBJECTIVE:  27 y.o. female complains of green vaginal discharge for 4 day(s). Denies abnormal vaginal bleeding or significant pelvic pain or fever. No UTI symptoms. Denies history of known exposure to STD.  Recent vaginal delivery on 01/27/23. No lacerations. Placenta delivered spontaneously. Normal PP lochia with exception of one episode with "gush" of bright red blood. Scant bleeding now. Sexually active X 1 since delivery.  No LMP recorded.  OBJECTIVE:  She appears well, afebrile. Urine dipstick: not done.  ASSESSMENT:  Vaginal Discharge    PLAN:  GC, chlamydia, trichomonas, BVAG, CVAG probe sent to lab. Treatment: To be determined once lab results are received ROV prn if symptoms persist or worsen.

## 2023-02-16 ENCOUNTER — Ambulatory Visit: Payer: Medicaid Other | Admitting: Obstetrics

## 2023-02-16 LAB — CERVICOVAGINAL ANCILLARY ONLY
Bacterial Vaginitis (gardnerella): POSITIVE — AB
Candida Glabrata: NEGATIVE
Candida Vaginitis: NEGATIVE
Chlamydia: NEGATIVE
Comment: NEGATIVE
Comment: NEGATIVE
Comment: NEGATIVE
Comment: NEGATIVE
Comment: NEGATIVE
Comment: NORMAL
Neisseria Gonorrhea: NEGATIVE
Trichomonas: NEGATIVE

## 2023-02-17 ENCOUNTER — Telehealth: Payer: Self-pay

## 2023-02-17 MED ORDER — METRONIDAZOLE 500 MG PO TABS: 500.0000 mg | ORAL_TABLET | Freq: Two times a day (BID) | ORAL | 0 refills | Status: DC

## 2023-02-17 NOTE — Telephone Encounter (Signed)
S/w pt and advised of results and rx sent. 
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
## 2023-02-28 ENCOUNTER — Ambulatory Visit: Payer: Medicaid Other | Admitting: Obstetrics and Gynecology

## 2023-02-28 ENCOUNTER — Ambulatory Visit (INDEPENDENT_AMBULATORY_CARE_PROVIDER_SITE_OTHER): Payer: Medicaid Other | Admitting: Advanced Practice Midwife

## 2023-02-28 ENCOUNTER — Other Ambulatory Visit (HOSPITAL_COMMUNITY)
Admission: RE | Admit: 2023-02-28 | Discharge: 2023-02-28 | Disposition: A | Discharge status: Home / Self Care | Payer: Medicaid Other | Source: Ambulatory Visit | Attending: Advanced Practice Midwife | Admitting: Advanced Practice Midwife

## 2023-02-28 ENCOUNTER — Encounter: Payer: Self-pay | Admitting: Advanced Practice Midwife

## 2023-02-28 VITALS — BP 119/68 | HR 72 | Ht 63.5 in | Wt 188.8 lb

## 2023-02-28 DIAGNOSIS — Z30013 Encounter for initial prescription of injectable contraceptive: Secondary | ICD-10-CM | POA: Diagnosis not present

## 2023-02-28 DIAGNOSIS — N898 Other specified noninflammatory disorders of vagina: Secondary | ICD-10-CM

## 2023-02-28 DIAGNOSIS — N920 Excessive and frequent menstruation with regular cycle: Secondary | ICD-10-CM | POA: Diagnosis not present

## 2023-02-28 DIAGNOSIS — N760 Acute vaginitis: Secondary | ICD-10-CM

## 2023-02-28 DIAGNOSIS — Z3202 Encounter for pregnancy test, result negative: Secondary | ICD-10-CM

## 2023-02-28 DIAGNOSIS — B9689 Other specified bacterial agents as the cause of diseases classified elsewhere: Secondary | ICD-10-CM

## 2023-02-28 LAB — POCT URINE PREGNANCY: Preg Test, Ur: NEGATIVE

## 2023-02-28 MED ORDER — MEDROXYPROGESTERONE ACETATE 150 MG/ML IM SUSP: 150.0000 mg | INTRAMUSCULAR | 3 refills | Status: DC

## 2023-02-28 MED ORDER — MEDROXYPROGESTERONE ACETATE 150 MG/ML IM SUSP
150.0000 mg | INTRAMUSCULAR | 3 refills | Status: DC
Start: 2023-02-28 — End: 2023-02-28

## 2023-02-28 MED ORDER — MEDROXYPROGESTERONE ACETATE 150 MG/ML IM SUSP
150.0000 mg | Freq: Once | INTRAMUSCULAR | Status: AC
  Administered 2023-02-28: 150 mg via INTRAMUSCULAR

## 2023-02-28 NOTE — Progress Notes (Signed)
Post Partum Visit Note  Andrea Jenkins is a 27 y.o. [id number] female who presents for a postpartum visit. She is 4 weeks postpartum following a normal spontaneous vaginal delivery.  I have fully reviewed the prenatal and intrapartum course. The delivery was at 38 gestational weeks.  Anesthesia: none. Postpartum course has been good. Baby is doing well. Baby is feeding by bottle - Similac Advance. Bleeding no bleeding. Bowel function is normal. Bladder function is normal. Patient had unprotected sex about 2 weeks ago. Neg UPT today. Contraception method is nothing. Interested in patch. Postpartum depression screening: negative.  Pt is 4 weeks PP. Bled for two weeks. Stopped for 1-2 weeks then bled heavily for 1 week and stopped yesterday. Denies dizziness.   Wants vaginal swab to see if BV has cleared up and wants to be tested for everything on Aptima swab.   Concerned about not losing weight after delivery. Says it happens with each pregnancy.    Upstream - 02/28/23 1302       Pregnancy Intention Screening   Does the patient want to become pregnant in the next year? No    Does the patient's partner want to become pregnant in the next year? No    Would the patient like to discuss contraceptive options today? Yes      Contraception Wrap Up   Current Method No Contraceptive Precautions    End Method Hormonal Injection    Contraception Counseling Provided Yes    How was the end contraceptive method provided? Provided on site            The pregnancy intention screening data noted above was reviewed. Potential methods of contraception were discussed. The patient elected to proceed with Hormonal Injection.   Edinburgh Postnatal Depression Scale - 02/28/23 1048       Edinburgh Postnatal Depression Scale:  In the Past 7 Days   I have been able to laugh and see the funny side of things. 0    I have looked forward with enjoyment to things. 0    I have blamed myself unnecessarily  when things went wrong. 0    I have been anxious or worried for no good reason. 0    I have felt scared or panicky for no good reason. 0    Things have been getting on top of me. 0    I have been so unhappy that I have had difficulty sleeping. 0    I have felt sad or miserable. 0    I have been so unhappy that I have been crying. 0    The thought of harming myself has occurred to me. 0    Edinburgh Postnatal Depression Scale Total 0             Health Maintenance Due  Topic Date Due   COVID-19 Vaccine (1) Never done    The following portions of the patient's history were reviewed and updated as appropriate: allergies, current medications, past family history, past medical history, past social history, past surgical history, and problem list.  Review of Systems Pertinent items are noted in HPI.  Objective:  BP 119/68   Pulse 72   Ht 5' 3.5" (1.613 m)   Wt 188 lb 12.8 oz (85.6 kg)   Breastfeeding No   BMI 32.92 kg/m    General:  alert, cooperative, appears stated age, no distress, and no pallor   Breasts:  not indicated  Lungs: Nml rate and effort  Heart:  Nml rate  Abdomen: soft, non-tender; bowel sounds normal; no masses,  no organomegaly   Wound NA  GU exam:   Declined. Self swab collected       Assessment:   1. Encounter for initial prescription of injectable contraceptive  - medroxyPROGESTERone (DEPO-PROVERA) 150 MG/ML injection; Inject 1 mL (150 mg total) into the muscle every 3 (three) months.  Dispense: 1 mL; Refill: 3  2. Menorrhagia with regular cycle - Bleeding is C/W resumption of menses. Offered, declined CBC today.   3. Postpartum care and exam - Annual Gyn and Pap due 1 year.   4. Hx BV - Aptima   Plan:   Essential components of care per ACOG recommendations:  1.  Mood and well being: Patient with negative depression screening today. Reviewed local resources for support.  - Patient tobacco use? No.  Second hand exposure - hx of drug use?  No.    2. Infant care and feeding:  -Patient currently breastmilk feeding? No.  -Social determinants of health (SDOH) reviewed in EPIC. The following needs were identified: Need to increase physical activity. Discussed [employer]. Has financial assistance, childcare, may help with healthy weight, opportunity to socialize.   3. Sexuality, contraception and birth spacing - Patient does not want a pregnancy in the next year.  Desired family size is 0 children.  - Reviewed reproductive life planning. Reviewed contraceptive methods based on pt preferences and effectiveness.  Patient desired Hormonal Injection today.   - Discussed birth spacing of 18 months  4. Sleep and fatigue -Encouraged family/partner/community support of 4 hrs of uninterrupted sleep to help with mood and fatigue  5. Physical Recovery  - Discussed patients delivery and complications. She describes her labor as good. - Patient had a Vaginal, no problems at delivery. Patient had  no  laceration. Perineal healing reviewed. Patient expressed understanding - Patient has urinary incontinence? No. - Patient is safe to resume physical and sexual activity  6.  Health Maintenance - HM due items addressed NA - Last pap smear  Diagnosis  Date Value Ref Range Status  04/08/2021   Final   - Negative for intraepithelial lesion or malignancy (NILM)   Pap smear not done at today's visit.  -Breast Cancer screening indicated? No.   7. Chronic Disease/Pregnancy Condition follow up: None  - PCP follow up recommended to discuss health weight.   Dorathy Kinsman, CNM [facility] [employer], [facility]

## 2023-02-28 NOTE — Addendum Note (Signed)
Addended by: Jearld Adjutant on: 02/28/2023 02:02 PM   Modules accepted: Orders

## 2023-02-28 NOTE — Patient Instructions (Addendum)
  [employer]

## 2023-02-28 NOTE — Addendum Note (Signed)
Addended by: Hope Pigeon V on: 02/28/2023 05:00 PM   Modules accepted: Orders

## 2023-03-01 ENCOUNTER — Ambulatory Visit: Payer: Medicaid Other | Attending: Obstetrics and Gynecology

## 2023-03-02 LAB — CERVICOVAGINAL ANCILLARY ONLY
Bacterial Vaginitis (gardnerella): POSITIVE — AB
Candida Glabrata: NEGATIVE
Candida Vaginitis: NEGATIVE
Chlamydia: NEGATIVE
Comment: NEGATIVE
Comment: NEGATIVE
Comment: NEGATIVE
Comment: NEGATIVE
Comment: NEGATIVE
Comment: NORMAL
Neisseria Gonorrhea: NEGATIVE
Trichomonas: NEGATIVE

## 2023-03-08 MED ORDER — METRONIDAZOLE 500 MG PO TABS
500.0000 mg | ORAL_TABLET | Freq: Two times a day (BID) | ORAL | 0 refills | Status: DC
Start: 2023-03-08 — End: 2023-05-30

## 2023-03-08 NOTE — Addendum Note (Signed)
Addended by: Dorathy Kinsman on: 03/08/2023 03:20 PM   Modules accepted: Orders

## 2023-03-11 ENCOUNTER — Telehealth (INDEPENDENT_AMBULATORY_CARE_PROVIDER_SITE_OTHER): Payer: Medicaid Other | Admitting: Student in an Organized Health Care Education/Training Program

## 2023-03-11 ENCOUNTER — Encounter (HOSPITAL_COMMUNITY): Payer: Self-pay | Admitting: Student in an Organized Health Care Education/Training Program

## 2023-03-11 DIAGNOSIS — F3162 Bipolar disorder, current episode mixed, moderate: Secondary | ICD-10-CM | POA: Diagnosis not present

## 2023-03-11 MED ORDER — SERTRALINE HCL 25 MG PO TABS
25.0000 mg | ORAL_TABLET | Freq: Every day | ORAL | 2 refills | Status: DC
Start: 2023-03-11 — End: 2023-05-13

## 2023-03-11 MED ORDER — ARIPIPRAZOLE 10 MG PO TABS
10.0000 mg | ORAL_TABLET | Freq: Every day | ORAL | 2 refills | Status: DC
Start: 2023-03-11 — End: 2024-07-11

## 2023-03-11 NOTE — Addendum Note (Signed)
Addended by: Eliseo Gum B on: 03/11/2023 02:13 PM   Modules accepted: Level of Service
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
## 2023-03-13 ENCOUNTER — Other Ambulatory Visit: Payer: Self-pay | Admitting: Obstetrics

## 2023-03-13 DIAGNOSIS — E669 Obesity, unspecified: Secondary | ICD-10-CM

## 2023-03-16 ENCOUNTER — Ambulatory Visit: Payer: Medicaid Other | Admitting: Obstetrics

## 2023-04-11 ENCOUNTER — Other Ambulatory Visit: Payer: Medicaid Other | Admitting: *Deleted

## 2023-04-11 ENCOUNTER — Ambulatory Visit: Payer: Medicaid Other | Admitting: *Deleted

## 2023-04-11 NOTE — Patient Outreach (Signed)
Care Coordination  04/11/2023  Andrea Jenkins 11/12/1995 [id number]   Successful outreach with Andrea Jenkins today. However, due to a scheduling conflict she request to reschedule this telephone visit. A new appointment was scheduled on 04/13/23 at 11:15 am. Patient agreed to new date and time.  Estanislado Emms RN, BSN [place]  Managed [facility] RN Care Coordinator [phone]

## 2023-04-13 ENCOUNTER — Other Ambulatory Visit: Payer: Medicaid Other | Admitting: *Deleted

## 2023-04-13 ENCOUNTER — Encounter: Payer: Self-pay | Admitting: *Deleted

## 2023-04-13 NOTE — Patient Outreach (Signed)
Care Management/Care Coordination  RN Case Manager Case Closure Note  04/13/2023 Name: Andrea Jenkins MRN: [id number] DOB: 1996/07/24  Andrea Jenkins is a 27 y.o. year old female who is a primary care patient of Earl Lagos, MD. The care management/care coordination team was consulted for assistance with chronic disease management and/or care coordination needs.   Care Plan : RN Care Manager Plan of Care  Updates made by Heidi Dach, RN since 04/13/2023 12:00 AM  Completed 04/13/2023   Problem: Health Management Needs related to Health Maintenance, Anxiety and Depression Resolved 04/13/2023     Long-Range Goal: Development of Plan of Care to address Health Management Needs related to Health Maintenance, Anxiety and Depression Completed 04/13/2023  Start Date: 12/18/2021  Expected End Date: 03/25/2023  Priority: High  Note:   Current Barriers:  Knowledge Deficits related to plan of care for management of Anxiety with Panic Symptoms, and Depression: depressed mood decreased appetite and Health Maintenance -[name] had a vaginal delivery on 01/27/23 and completed her Post Partum visit on 02/28/23. [name] denies any needs today.      RNCM Clinical Goal(s):  Patient will verbalize understanding of plan for management of Anxiety, Depression, and Health Maintenance as evidenced by verbalization of self monitoring activities take all medications exactly as prescribed and will call provider for medication related questions as evidenced by documentation in EMR    work with social worker to address Mental Health Concerns  related to the management of Anxiety and Depression as evidenced by review of EMR and patient or social worker report     through collaboration with Medical illustrator, provider, and care team. -Met Work with Care Guide for food insecurity and needing a dental provider-Met  Interventions: Inter-disciplinary care team collaboration (see longitudinal plan of  care) Evaluation of current treatment plan related to  self management and patient's adherence to plan as established by provider Advised patient to schedule with PCP at [facility] [phone] Discussed Case Closure, advised patient to contact RNCM with new needs/barriers to managing her health   Pregnancy :  (Status:  Goal Met.)  Long Term Goal Patient completed Post Partum visit on 02/28/23 Recommended adequate rest     Advised to follow routine screening guidelines as outlined by obstetrics provider    Provided patient with Pregnancy education via MyChart Reviewed upcoming appointments including:3/19, 3/26 and 01/25/23 with OB provider and 01/20/23 with PT Advised to contact OB provider with concerns or questions Assessed for fetal movement, discussed fetal kick count and labor precautions Reviewed medications-advised patient to pick up potassium from [facility] and take as directed Reviewed recent MAU visit and discussed Advised patient to follow up with [employer] services for prenatal benefits   Patient Goals/Self-Care Activities: Take medications as prescribed   Attend church or other social activities Work with the Child psychotherapist to address care coordination needs and will continue to work with the clinical team to address health care and disease management related needs call 1-800-273-TALK (toll free, 24 hour hotline) go to [facility] [address], [place] [phone]) call 911 if experiencing a Mental Health or Behavioral Health Crisis  Schedule new patient appointment with provider of choice       Plan: The patient has met all care management goals, agreed to case closure, and has been provided with contact information for the care management team. Appropriate care team members and provider have been notified via electronic communication. The  care management team is available to at any time in the future should needs  arise.   Estanislado Emms RN, BSN [place]  Managed [facility] RN Care Coordinator [phone]

## 2023-05-03 ENCOUNTER — Encounter: Payer: Self-pay | Admitting: Student

## 2023-05-03 ENCOUNTER — Ambulatory Visit: Payer: Medicaid Other | Admitting: Student

## 2023-05-03 VITALS — BP 125/75 | HR 89 | Temp 98.3°F | Ht 63.5 in | Wt 206.5 lb

## 2023-05-03 DIAGNOSIS — Z6836 Body mass index (BMI) 36.0-36.9, adult: Secondary | ICD-10-CM | POA: Diagnosis not present

## 2023-05-03 DIAGNOSIS — Z131 Encounter for screening for diabetes mellitus: Secondary | ICD-10-CM | POA: Diagnosis not present

## 2023-05-03 DIAGNOSIS — E669 Obesity, unspecified: Secondary | ICD-10-CM

## 2023-05-03 DIAGNOSIS — F411 Generalized anxiety disorder: Secondary | ICD-10-CM | POA: Diagnosis not present

## 2023-05-03 DIAGNOSIS — F314 Bipolar disorder, current episode depressed, severe, without psychotic features: Secondary | ICD-10-CM

## 2023-05-03 LAB — POCT GLYCOSYLATED HEMOGLOBIN (HGB A1C): Hemoglobin A1C: 5.1 % (ref 4.0–5.6)

## 2023-05-03 LAB — GLUCOSE, CAPILLARY: Glucose-Capillary: 76 mg/dL (ref 70–99)

## 2023-05-03 MED ORDER — PHENTERMINE HCL 15 MG PO CAPS
15.0000 mg | ORAL_CAPSULE | ORAL | 0 refills | Status: DC
Start: 2023-05-03 — End: 2023-06-03

## 2023-05-03 NOTE — Assessment & Plan Note (Signed)
BMI 36.01. Patient states continued weight gain since giving birth 3 months ago. She is not breastfeeding. States she has tried to stay active and making dietary changes while caring for newborn and other children. States she was on phentermine before with noted weight loss. A1c today was normal. Denies any worsening fatigue, hair loss on heat or cold intolerances. No hx of thyroid disease. Discussed with patient that phentermine is short term use and she understands not long term.   Plan -Continue lifestyle modifications -Start phentermine 15 mg every morning  -F/u in 4 weeks to reassess

## 2023-05-03 NOTE — Assessment & Plan Note (Signed)
PHQ-9 was 19. She was on Abilify 10 mg and Zoloft 25 mg but stopped and has been off of both medications for at least 1 month. States Abilify has helped before but stopped both meds after missing doses while caring for all her children and recent newborn. Encouraged patient to restart both Abilify and Zoloft and have f/u with [facility]. Currently denies any SI or HI. Patient verbalizes understanding and will f/u with [facility] on 7/19.   Plan -Restart Abilify 10 mg daily and Zoloft 25 mg daily -F/u with [facility] on 7/19

## 2023-05-03 NOTE — Assessment & Plan Note (Signed)
GAD-7 was 16 today. States she was on Abilify 10 mg and Zoloft 25 mg but stopped. Been off of both for at least 1 month. States stopped due to caring for all her children and forgetting to take her medications. Some anxiety from weight gain as well. Encouraged patient to restart both Abilify and Zoloft and had f/u with [facility] later this month.
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
## 2023-05-03 NOTE — Patient Instructions (Addendum)
Thank you, [name] for allowing [place] to provide your care today. Today we discussed your weight gain post partum and anxiety and depression.   -Continue working on healthy food options and staying active at home. -I will send prescription for phentermine to help with weight loss.  -Phentermine is not a long term medication but will help initially with weight loss. -No breastfeeding while taking phentermine.  -Please start taking your Abilify and Zoloft (sertraline) to help with bipolar and anxiety and depression. -Please follow up with behavioral health, Dr. Morrie Sheldon, on 7/19 -Will do blood work today to check for diabetes    I have ordered the following labs for you:  Lab Orders         POC Hbg A1C       Follow up:  1 month    Should you have any questions or concerns please call the internal medicine clinic at [phone].    Rana Snare, D.O. [facility]

## 2023-05-10 NOTE — Progress Notes (Signed)
 Internal Medicine Clinic Attending  Case discussed with the resident at the time of the visit.  We reviewed the resident's history and exam and pertinent patient test results.  I agree with the assessment, diagnosis, and plan of care documented in the resident's note.  

## 2023-05-13 ENCOUNTER — Telehealth (HOSPITAL_COMMUNITY): Payer: Medicaid Other | Admitting: Student in an Organized Health Care Education/Training Program

## 2023-05-13 DIAGNOSIS — F3162 Bipolar disorder, current episode mixed, moderate: Secondary | ICD-10-CM | POA: Diagnosis not present

## 2023-05-13 DIAGNOSIS — F3130 Bipolar disorder, current episode depressed, mild or moderate severity, unspecified: Secondary | ICD-10-CM

## 2023-05-13 MED ORDER — ABILIFY MAINTENA 400 MG IM PRSY
400.0000 mg | PREFILLED_SYRINGE | INTRAMUSCULAR | 4 refills | Status: DC
Start: 2023-05-13 — End: 2024-07-11

## 2023-05-13 MED ORDER — SERTRALINE HCL 25 MG PO TABS
25.0000 mg | ORAL_TABLET | Freq: Every day | ORAL | 2 refills | Status: DC
Start: 2023-05-13 — End: 2024-07-11

## 2023-05-13 NOTE — Addendum Note (Signed)
Addended by: Eliseo Gum B on: 05/13/2023 02:59 PM   Modules accepted: Level of Service

## 2023-05-13 NOTE — Patient Instructions (Addendum)
Medications  When you come for your shot, continue to take the Abilify 10mg  daily for 14 more days then STOP.  Your shot will be sent by a new pharmacy:  [id number] - [place], [place] - [address]  They should be sending it to the clinic, so you do not have to worry about picking it up from the pharmacy.   2. Continue your Zoloft 25mg  daily
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
## 2023-05-23 ENCOUNTER — Ambulatory Visit: Payer: Medicaid Other

## 2023-05-30 ENCOUNTER — Encounter: Payer: Self-pay | Admitting: Advanced Practice Midwife

## 2023-05-30 ENCOUNTER — Other Ambulatory Visit (HOSPITAL_COMMUNITY)
Admission: RE | Admit: 2023-05-30 | Discharge: 2023-05-30 | Disposition: A | Discharge status: Home / Self Care | Payer: Medicaid Other | Source: Ambulatory Visit | Attending: Advanced Practice Midwife | Admitting: Advanced Practice Midwife

## 2023-05-30 ENCOUNTER — Ambulatory Visit: Payer: Medicaid Other | Admitting: Obstetrics and Gynecology

## 2023-05-30 VITALS — BP 111/78 | HR 83 | Ht 63.5 in | Wt 206.6 lb

## 2023-05-30 DIAGNOSIS — Z3009 Encounter for other general counseling and advice on contraception: Secondary | ICD-10-CM

## 2023-05-30 DIAGNOSIS — N898 Other specified noninflammatory disorders of vagina: Secondary | ICD-10-CM | POA: Diagnosis not present

## 2023-05-30 DIAGNOSIS — Z113 Encounter for screening for infections with a predominantly sexual mode of transmission: Secondary | ICD-10-CM | POA: Diagnosis not present

## 2023-05-30 DIAGNOSIS — Z302 Encounter for sterilization: Secondary | ICD-10-CM | POA: Insufficient documentation

## 2023-05-30 NOTE — Patient Instructions (Addendum)
Try over-the-counter products with hyaluronic acid. Brands include good clean love, ph-D feminine health.

## 2023-05-30 NOTE — Progress Notes (Signed)
   GYNECOLOGY PROGRESS NOTE  History:  27 y.o. [id number] presents to [facility] office today for problem gyn visit. She reports desire for contraception, STI testing, and concern for vaginal dryness. Last unprotected intercourse 4 days ago.  Patient desires contraception. She has had 4 pregnancies and does NOT desire any further pregnancies. She has had an anaphylactic reaction to OCPs, breakthrough bleeding with nexplanon, IUD expulsion. She had depo within the last 3 months and has had significant weight gain with it.  Does not have any concerns for active STI infection. No vaginal discharge, itching, burning. Does note dryness that has not responded to OTC vaginal lubricants for the past few years.   The following portions of the patient's history were reviewed and updated as appropriate: allergies, current medications, past family history, past medical history, past social history, past surgical history and problem list.  Health Maintenance Due  Topic Date Due   COVID-19 Vaccine (1 - 2023-24 season) Never done   INFLUENZA VACCINE  05/26/2023     Review of Systems:  Pertinent items are noted in HPI.   Objective:  Physical Exam Blood pressure 111/78, pulse 83, height 5' 3.5" (1.613 m), weight 206 lb 9.6 oz (93.7 kg), not currently breastfeeding. VS reviewed, nursing note reviewed,  Constitutional: well developed, well nourished, no distress HEENT: normocephalic CV: normal rate Pulm/chest wall: normal effort Breast Exam: deferred Abdomen: soft Neuro: alert and oriented x 3 Skin: warm, dry Psych: affect normal Pelvic exam: Cervix pink, visually closed, without lesion, scant blood. Visually atrophic vaginal tissue  Assessment & Plan:  1. Routine screening for STI (sexually transmitted infection) Denies current symptoms. - HIV antibody (with reflex) - RPR - Cervicovaginal ancillary only( [place])  2. Vaginal dryness Chronic, uncontrolled.  - Visually atrophic vaginal  tissue on exam today. Advised vaginal lubricants with hyaluronic acid - Discussed that vaginal estrogen at this age would potentially be a significant risk for cancers down the road  3. Encounter for counseling regarding contraception Pt with prior adverse reactions/effects to many contraception options in the past. Desires tubal ligation. Will reschedule for surgical consult.   Return for ASAP - Tubal consult.   Joanne Gavel, MD 2:36 PM

## 2023-05-30 NOTE — Progress Notes (Signed)
Pt presents for [facility] consult. Last unprotected sex 4 days ago. Declines Depo. Requesting STD testing. C/o vaginal dryness

## 2023-06-03 ENCOUNTER — Encounter: Payer: Self-pay | Admitting: Student

## 2023-06-03 ENCOUNTER — Ambulatory Visit: Payer: Medicaid Other | Admitting: Student

## 2023-06-03 VITALS — BP 121/70 | HR 80 | Temp 98.4°F | Ht 63.5 in | Wt 210.1 lb

## 2023-06-03 DIAGNOSIS — E669 Obesity, unspecified: Secondary | ICD-10-CM | POA: Diagnosis not present

## 2023-06-03 DIAGNOSIS — Z3200 Encounter for pregnancy test, result unknown: Secondary | ICD-10-CM | POA: Diagnosis not present

## 2023-06-03 DIAGNOSIS — Z79899 Other long term (current) drug therapy: Secondary | ICD-10-CM

## 2023-06-03 DIAGNOSIS — Z6836 Body mass index (BMI) 36.0-36.9, adult: Secondary | ICD-10-CM

## 2023-06-03 LAB — POCT URINE PREGNANCY: Preg Test, Ur: NEGATIVE

## 2023-06-03 MED ORDER — ORLISTAT 60 MG PO CAPS
60.0000 mg | ORAL_CAPSULE | Freq: Three times a day (TID) | ORAL | 11 refills | Status: DC
Start: 2023-06-03 — End: 2023-08-05

## 2023-06-03 NOTE — Progress Notes (Signed)
Established Patient Office Visit  Subjective   Patient ID: Andrea Jenkins, female    DOB: 01/30/1996  Age: 27 y.o. MRN: [id number]  Chief Complaint  Patient presents with   Obesity    Gaining weight she uis trying to lose weight    HPI  Andrea Jenkins is coming in for a follow up on her weight loss journey.   Review of Systems  Constitutional:  Negative for chills and fever.  Respiratory:  Negative for cough.   Cardiovascular:  Negative for chest pain.  Gastrointestinal:  Negative for abdominal pain and diarrhea.  Psychiatric/Behavioral:  Negative for depression and hallucinations. The patient is not nervous/anxious and does not have insomnia.      Objective:     BP 121/70 (BP Location: Left Arm, Patient Position: Sitting, Cuff Size: Normal)   Pulse 80   Temp 98.4 F (36.9 C) (Oral)   Ht 5' 3.5" (1.613 m)   Wt 210 lb 1.6 oz (95.3 kg)   LMP 06/03/2023 (Approximate)   SpO2 100%   Breastfeeding No   BMI 36.63 kg/m     Physical Exam Constitutional:      General: She is not in acute distress.    Appearance: She is obese. She is not ill-appearing.  Cardiovascular:     Rate and Rhythm: Normal rate and regular rhythm.     Heart sounds: No murmur heard.    No friction rub. No gallop.  Pulmonary:     Effort: No respiratory distress.     Breath sounds: No wheezing, rhonchi or rales.  Abdominal:     General: Bowel sounds are normal. There is no distension.     Palpations: There is no mass.     Tenderness: There is no abdominal tenderness. There is no guarding.  Neurological:     Mental Status: She is alert.    Results for orders placed or performed in visit on 06/03/23  POCT Urine Pregnancy  Result Value Ref Range   Preg Test, Ur Negative Negative    Last CBC Lab Results  Component Value Date   WBC 4.9 02/08/2023   HGB 11.4 (L) 02/08/2023   HCT 35.0 (L) 02/08/2023   MCV 84.7 02/08/2023   MCH 27.6 02/08/2023   RDW 13.4 02/08/2023   PLT 274  02/08/2023   Last metabolic panel Lab Results  Component Value Date   GLUCOSE 89 02/08/2023   NA 137 02/08/2023   K 4.0 02/08/2023   CL 103 02/08/2023   CO2 26 02/08/2023   BUN 7 02/08/2023   CREATININE 0.71 02/08/2023   GFRNONAA >60 02/08/2023   CALCIUM 9.2 02/08/2023   PROT 6.8 02/08/2023   ALBUMIN 3.4 (L) 02/08/2023   LABGLOB 2.7 09/01/2022   AGRATIO 1.4 09/01/2022   BILITOT 0.6 02/08/2023   ALKPHOS 61 02/08/2023   AST 20 02/08/2023   ALT 16 02/08/2023   ANIONGAP 8 02/08/2023   Last hemoglobin A1c Lab Results  Component Value Date   HGBA1C 5.1 05/03/2023   Last thyroid functions Lab Results  Component Value Date   TSH 1.430 03/18/2022    The ASCVD Risk score (Arnett DK, et al., 2019) failed to calculate for the following reasons:   The 2019 ASCVD risk score is only valid for ages 32 to 61    Assessment & Plan:   Problem List Items Addressed This Visit       Other   Obesity (BMI 30-39.9) - Primary    Last seen a  month ago and was prescribed Phentermine 15mg  every morning. The patient has a hx of bipolar disorder and follows with Behavioral Health. She has been using her phentermine as prescribed but has gained 4 lbs since her last visit. There is concern that an increase in the dose of phentermine could increase her risk of having a manic effect. As a result, even though she reports having tolerated higher doses in the past, the risk of having a manic episode and the implications that could have in her life outweighs the benefits. She is willing to start orlistat. Pt aware that is may cause diarrhea. She is not too well versed on what foods are healthy versus which ones should be avoided and how to diet. Exercises walking around the playground with her kids almost on a daily basis. She would benefit from having a consult with Nutrition so she can have more education on what foods would be good for her in her weight loss journey. TSH and A1C WNL at last visit.   -Discontinue phentermine  -Start Orlistat (negative pregnancy test today).  -Pt aware not to get pregnant while on the medication  -Referral to bariatric surgery  -Pt signed up to patient seminar for [place] [facility].      Relevant Medications   orlistat (ALLI) 60 MG capsule   Other Relevant Orders   Referral to Nutrition and Diabetes Services   Amb Referral to Bariatric Surgery   Other Visit Diagnoses     Medication management       Relevant Orders   POCT Urine Pregnancy (Completed)       Return in about 4 weeks (around 07/01/2023).    Manuela Neptune, MD

## 2023-06-03 NOTE — Patient Instructions (Addendum)
Thank you, Ms.Kathryne Hitch Prophete for allowing [place] to provide your care today. Today we discussed options for weight loss.    [facility]:   [email or web address]   I sent in the prescription for Orlistat.   I sent in the referral:   Nutrition services  Bariatric Surgery with Gastroenterology    Remember:   Should you have any questions or concerns please call the internal medicine clinic at [phone].     [place] [facility]

## 2023-06-03 NOTE — Assessment & Plan Note (Addendum)
Last seen a month ago and was prescribed Phentermine 15mg  every morning. The patient has a hx of bipolar disorder and follows with Behavioral Health. She has been using her phentermine as prescribed but has gained 4 lbs since her last visit. There is concern that an increase in the dose of phentermine could increase her risk of having a manic effect. As a result, even though she reports having tolerated higher doses in the past, the risk of having a manic episode and the implications that could have in her life outweighs the benefits. She is willing to start orlistat. Pt aware that is may cause diarrhea. She is not too well versed on what foods are healthy versus which ones should be avoided and how to diet. Exercises walking around the playground with her kids almost on a daily basis. She would benefit from having a consult with Nutrition so she can have more education on what foods would be good for her in her weight loss journey. TSH and A1C WNL at last visit.  -Discontinue phentermine  -Start Orlistat (negative pregnancy test today).  -Pt aware not to get pregnant while on the medication  -Referral to bariatric surgery  -Pt signed up to patient seminar for [place] [facility].

## 2023-06-06 ENCOUNTER — Other Ambulatory Visit: Payer: Self-pay | Admitting: Obstetrics and Gynecology

## 2023-06-06 DIAGNOSIS — O3680X0 Pregnancy with inconclusive fetal viability, not applicable or unspecified: Secondary | ICD-10-CM

## 2023-06-07 NOTE — Progress Notes (Signed)
Internal Medicine Clinic Attending  I was physically present during the key portions of the resident provided service and participated in the medical decision making of patient's management care. I reviewed pertinent patient test results.  The assessment, diagnosis, and plan were formulated together and I agree with the documentation in the resident's note.  Lau, Grace, MD  

## 2023-06-07 NOTE — Addendum Note (Signed)
Addended by: Manuela Neptune on: 06/07/2023 08:31 AM   Modules accepted: Orders

## 2023-06-07 NOTE — Addendum Note (Signed)
Addended by: Dickie La on: 06/07/2023 09:22 AM   Modules accepted: Level of Service

## 2023-06-20 ENCOUNTER — Ambulatory Visit (INDEPENDENT_AMBULATORY_CARE_PROVIDER_SITE_OTHER): Payer: Medicaid Other | Admitting: Obstetrics and Gynecology

## 2023-06-20 ENCOUNTER — Encounter: Payer: Self-pay | Admitting: Obstetrics and Gynecology

## 2023-06-20 VITALS — BP 121/78 | HR 87 | Wt 215.0 lb

## 2023-06-20 DIAGNOSIS — Z3009 Encounter for other general counseling and advice on contraception: Secondary | ICD-10-CM | POA: Diagnosis not present

## 2023-06-20 NOTE — Progress Notes (Signed)
Pt is in office to discuss Tubal and ?weight gain.  Consent signed today.  Pt states she has been bleeding since Depo injection- last given in May.  Pt states she has bleeding daily.  Pt states she was advised that she may discuss vaginal estrogens further with provider today. Pt was advised to use lubricants at last visit.
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
## 2023-07-04 ENCOUNTER — Encounter: Payer: Medicaid Other | Admitting: Student

## 2023-07-19 ENCOUNTER — Other Ambulatory Visit (HOSPITAL_COMMUNITY)
Admission: RE | Admit: 2023-07-19 | Discharge: 2023-07-19 | Disposition: A | Discharge status: Home / Self Care | Payer: Medicaid Other | Source: Ambulatory Visit | Attending: Obstetrics and Gynecology | Admitting: Obstetrics and Gynecology

## 2023-07-19 ENCOUNTER — Ambulatory Visit: Payer: Medicaid Other | Admitting: Emergency Medicine

## 2023-07-19 VITALS — BP 118/75 | HR 85 | Wt 214.0 lb

## 2023-07-19 DIAGNOSIS — Z113 Encounter for screening for infections with a predominantly sexual mode of transmission: Secondary | ICD-10-CM

## 2023-07-19 NOTE — Progress Notes (Signed)
SUBJECTIVE:  27 y.o. female complains of white vaginal discharge for 2 day(s). Denies abnormal vaginal bleeding or significant pelvic pain or fever. No UTI symptoms. Denies history of known exposure to STD.  No LMP recorded.  OBJECTIVE:  She appears well, afebrile. Urine dipstick: not done.  ASSESSMENT:  Vaginal Discharge  Vaginal Odor   PLAN:  GC, chlamydia, trichomonas, BVAG, CVAG probe sent to lab. Treatment: To be determined once lab results are received ROV prn if symptoms persist or worsen.

## 2023-07-21 LAB — CERVICOVAGINAL ANCILLARY ONLY
Bacterial Vaginitis (gardnerella): POSITIVE — AB
Candida Glabrata: NEGATIVE
Candida Vaginitis: POSITIVE — AB
Chlamydia: NEGATIVE
Comment: NEGATIVE
Comment: NEGATIVE
Comment: NEGATIVE
Comment: NEGATIVE
Comment: NEGATIVE
Comment: NORMAL
Neisseria Gonorrhea: NEGATIVE
Trichomonas: NEGATIVE

## 2023-07-25 ENCOUNTER — Other Ambulatory Visit: Payer: Self-pay | Admitting: Obstetrics & Gynecology

## 2023-07-25 DIAGNOSIS — N898 Other specified noninflammatory disorders of vagina: Secondary | ICD-10-CM

## 2023-07-25 MED ORDER — FLUCONAZOLE 150 MG PO TABS
150.0000 mg | ORAL_TABLET | Freq: Once | ORAL | 0 refills | Status: AC
Start: 2023-07-25 — End: 2023-07-25

## 2023-07-25 MED ORDER — METRONIDAZOLE 500 MG PO TABS
500.0000 mg | ORAL_TABLET | Freq: Two times a day (BID) | ORAL | 0 refills | Status: DC
Start: 2023-07-25 — End: 2023-08-23

## 2023-07-25 NOTE — Progress Notes (Signed)
Meds ordered this encounter  Medications   metroNIDAZOLE (FLAGYL) 500 MG tablet    Sig: Take 1 tablet (500 mg total) by mouth 2 (two) times daily.    Dispense:  14 tablet    Refill:  0   fluconazole (DIFLUCAN) 150 MG tablet    Sig: Take 1 tablet (150 mg total) by mouth once for 1 dose.    Dispense:  1 tablet    Refill:  0

## 2023-08-01 ENCOUNTER — Encounter: Payer: Self-pay | Admitting: Family Medicine

## 2023-08-01 ENCOUNTER — Ambulatory Visit: Payer: Medicaid Other | Admitting: Family Medicine

## 2023-08-01 VITALS — BP 115/69 | HR 92 | Ht 63.0 in | Wt 223.3 lb

## 2023-08-01 DIAGNOSIS — N898 Other specified noninflammatory disorders of vagina: Secondary | ICD-10-CM

## 2023-08-01 NOTE — Progress Notes (Signed)
Pt. Presents for follow up- vaginal dryness

## 2023-08-01 NOTE — Assessment & Plan Note (Signed)
Chronic issue of vaginal dryness.  Pain with intercourse.  Discussed use of lubricants during intercourse and patient will try this.  She will also continue to use the lubricants and moisturizers daily.  Will follow-up as needed.  No further questions or concerns.

## 2023-08-01 NOTE — Progress Notes (Signed)
    SUBJECTIVE:   CHIEF COMPLAINT / HPI:   Vaginal dryness follow-up Patient presenting for evaluation for vaginal dryness.  Has been seen previously in the clinic for this issue.  Was recommended to use vaginal lubricant and moisturizer.  She reports that she has been doing this for over 12 weeks and has seen little improvement.  She reports intercourse is still painful.  She reports that she uses lubricant in the morning but does not use it prior to intercourse.  Has never tried lubricant for intercourse.  Denies any abnormal discharge.  Was recently diagnosed with bacterial vaginosis and a yeast infection and has taken the BV treatment but not yet the yeast treatment.  OBJECTIVE:   BP 115/69   Pulse 92   Ht 5\' 3"  (1.6 m)   Wt 223 lb 4.8 oz (101.3 kg)   LMP 07/24/2023 (Approximate)   BMI 39.56 kg/m   General: Well-appearing 27 year old female Cardiac: Regular rate Respiratory: Normal for breathing Abdomen: Soft, nontender GU: No abnormal discharge.  Normal external vaginal tissue, normal-appearing cervix with no friability appreciated, not abnormally dry   ASSESSMENT/PLAN:   Vaginal dryness Chronic issue of vaginal dryness.  Pain with intercourse.  Discussed use of lubricants during intercourse and patient will try this.  She will also continue to use the lubricants and moisturizers daily.  Will follow-up as needed.  No further questions or concerns.     Celedonio Savage, MD [facility]

## 2023-08-02 DIAGNOSIS — F411 Generalized anxiety disorder: Secondary | ICD-10-CM | POA: Diagnosis not present

## 2023-08-02 DIAGNOSIS — F900 Attention-deficit hyperactivity disorder, predominantly inattentive type: Secondary | ICD-10-CM | POA: Diagnosis not present

## 2023-08-02 DIAGNOSIS — F3162 Bipolar disorder, current episode mixed, moderate: Secondary | ICD-10-CM | POA: Diagnosis not present

## 2023-08-03 ENCOUNTER — Other Ambulatory Visit (HOSPITAL_COMMUNITY): Payer: Self-pay | Admitting: Physician Assistant

## 2023-08-03 ENCOUNTER — Other Ambulatory Visit: Payer: Self-pay

## 2023-08-03 DIAGNOSIS — F3162 Bipolar disorder, current episode mixed, moderate: Secondary | ICD-10-CM

## 2023-08-03 DIAGNOSIS — B379 Candidiasis, unspecified: Secondary | ICD-10-CM

## 2023-08-03 MED ORDER — FLUCONAZOLE 150 MG PO TABS
150.0000 mg | ORAL_TABLET | Freq: Once | ORAL | 0 refills | Status: AC
Start: 2023-08-03 — End: 2023-08-03

## 2023-08-04 ENCOUNTER — Other Ambulatory Visit: Payer: Self-pay

## 2023-08-04 ENCOUNTER — Emergency Department (HOSPITAL_COMMUNITY)
Admission: EM | Admit: 2023-08-04 | Discharge: 2023-08-05 | Disposition: A | Discharge status: Home / Self Care | Payer: Medicaid Other | Attending: Emergency Medicine | Admitting: Emergency Medicine

## 2023-08-04 DIAGNOSIS — J45909 Unspecified asthma, uncomplicated: Secondary | ICD-10-CM | POA: Diagnosis not present

## 2023-08-04 DIAGNOSIS — T7840XA Allergy, unspecified, initial encounter: Secondary | ICD-10-CM | POA: Insufficient documentation

## 2023-08-04 LAB — BASIC METABOLIC PANEL
Anion gap: 7 (ref 5–15)
BUN: 9 mg/dL (ref 6–20)
CO2: 21 mmol/L — ABNORMAL LOW (ref 22–32)
Calcium: 8.7 mg/dL — ABNORMAL LOW (ref 8.9–10.3)
Chloride: 106 mmol/L (ref 98–111)
Creatinine, Ser: 0.79 mg/dL (ref 0.44–1.00)
GFR, Estimated: >60 mL/min (ref >60)
Glucose, Bld: 96 mg/dL (ref 70–99)
Potassium: 3.8 mmol/L (ref 3.5–5.1)
Sodium: 134 mmol/L — ABNORMAL LOW (ref 135–145)

## 2023-08-04 LAB — CBC WITH DIFFERENTIAL/PLATELET
Abs Immature Granulocytes: 0.02 10*3/uL (ref 0.00–0.07)
Basophils Absolute: 0 10*3/uL (ref 0.0–0.1)
Basophils Relative: 0 %
Eosinophils Absolute: 0.1 10*3/uL (ref 0.0–0.5)
Eosinophils Relative: 1 %
HCT: 40.7 % (ref 36.0–46.0)
Hemoglobin: 13.1 g/dL (ref 12.0–15.0)
Immature Granulocytes: 0 %
Lymphocytes Relative: 22 %
Lymphs Abs: 1.4 10*3/uL (ref 0.7–4.0)
MCH: 27.6 pg (ref 26.0–34.0)
MCHC: 32.2 g/dL (ref 30.0–36.0)
MCV: 85.7 fL (ref 80.0–100.0)
Monocytes Absolute: 0.2 10*3/uL (ref 0.1–1.0)
Monocytes Relative: 3 %
Neutro Abs: 4.8 10*3/uL (ref 1.7–7.7)
Neutrophils Relative %: 74 %
Platelets: 235 10*3/uL (ref 150–400)
RBC: 4.75 MIL/uL (ref 3.87–5.11)
RDW: 13.7 % (ref 11.5–15.5)
WBC: 6.5 10*3/uL (ref 4.0–10.5)
nRBC: 0 % (ref 0.0–0.2)

## 2023-08-04 MED ORDER — FAMOTIDINE IN NACL 20-0.9 MG/50ML-% IV SOLN
20.0000 mg | Freq: Once | INTRAVENOUS | Status: AC
  Administered 2023-08-04: 20 mg via INTRAVENOUS
  Filled 2023-08-04: qty 50

## 2023-08-04 MED ORDER — FAMOTIDINE 20 MG PO TABS: 20.0000 mg | ORAL_TABLET | Freq: Once | ORAL | Status: DC

## 2023-08-04 MED ORDER — DEXAMETHASONE SODIUM PHOSPHATE 10 MG/ML IJ SOLN
10.0000 mg | Freq: Once | INTRAMUSCULAR | Status: AC
  Administered 2023-08-04: 10 mg via INTRAVENOUS
  Filled 2023-08-04: qty 1

## 2023-08-04 MED ORDER — DIPHENHYDRAMINE HCL 50 MG/ML IJ SOLN
25.0000 mg | Freq: Once | INTRAMUSCULAR | Status: AC
  Administered 2023-08-04: 25 mg via INTRAVENOUS
  Filled 2023-08-04: qty 1

## 2023-08-04 MED ORDER — DIPHENHYDRAMINE HCL 25 MG PO CAPS: 25.0000 mg | ORAL_CAPSULE | Freq: Once | ORAL | Status: DC
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
## 2023-08-04 NOTE — ED Triage Notes (Signed)
Pt reports itchiness that started approximately 45 minutes ago, pt reports throat feels itchy and whole body is itchy after taking Flagyl.

## 2023-08-05 ENCOUNTER — Other Ambulatory Visit (HOSPITAL_BASED_OUTPATIENT_CLINIC_OR_DEPARTMENT_OTHER): Payer: Self-pay | Admitting: Obstetrics & Gynecology

## 2023-08-05 ENCOUNTER — Telehealth: Payer: Self-pay

## 2023-08-05 DIAGNOSIS — Z01812 Encounter for preprocedural laboratory examination: Secondary | ICD-10-CM

## 2023-08-05 LAB — HCG, SERUM, QUALITATIVE: Preg, Serum: NEGATIVE

## 2023-08-05 MED ORDER — PREDNISONE 10 MG PO TABS
20.0000 mg | ORAL_TABLET | Freq: Every day | ORAL | 0 refills | Status: AC
Start: 2023-08-05 — End: 2023-08-10

## 2023-08-05 MED ORDER — DIPHENHYDRAMINE HCL 25 MG PO TABS
25.0000 mg | ORAL_TABLET | Freq: Four times a day (QID) | ORAL | 0 refills | Status: DC
Start: 2023-08-05 — End: 2023-08-23

## 2023-08-05 MED ORDER — EPINEPHRINE 0.3 MG/0.3ML IJ SOAJ: 0.3000 mg | INTRAMUSCULAR | 0 refills | Status: DC | PRN

## 2023-08-05 MED ORDER — FAMOTIDINE 20 MG PO TABS
20.0000 mg | ORAL_TABLET | Freq: Two times a day (BID) | ORAL | 0 refills | Status: DC
Start: 2023-08-05 — End: 2024-07-11

## 2023-08-05 NOTE — Telephone Encounter (Signed)
Called patient to advised that Dr. Hyacinth Meeker has agreed to perform her surgery on 08/23/23  @ [facility] at 8:30 am. Patient was available and was thankful for the surgery date. Pre-op instructions were provided over the phone and patient is aware she must arrive at 6:30 am.

## 2023-08-05 NOTE — Discharge Instructions (Addendum)
Please take your medication as prescribed.  Use EpiPen if you develop tongue swelling, throat closing, trouble breathing. I recommend close follow-up with PCP for reevaluation.  Please do not hesitate to return to emergency department if worrisome signs symptoms we discussed become apparent.

## 2023-08-15 ENCOUNTER — Ambulatory Visit: Payer: Medicaid Other | Admitting: Emergency Medicine

## 2023-08-15 ENCOUNTER — Other Ambulatory Visit (HOSPITAL_COMMUNITY)
Admission: RE | Admit: 2023-08-15 | Discharge: 2023-08-15 | Disposition: A | Discharge status: Home / Self Care | Payer: Medicaid Other | Source: Ambulatory Visit | Attending: Obstetrics and Gynecology | Admitting: Obstetrics and Gynecology

## 2023-08-15 VITALS — BP 107/50 | HR 65 | Ht 63.0 in | Wt 221.7 lb

## 2023-08-15 DIAGNOSIS — N898 Other specified noninflammatory disorders of vagina: Secondary | ICD-10-CM

## 2023-08-15 NOTE — Progress Notes (Signed)
SUBJECTIVE:  27 y.o. female complains of creamy vaginal discharge  and odor for 3 day(s). Denies abnormal vaginal bleeding or significant pelvic pain or fever. Notes frequent urination. Denies history of known exposure to STD.  Patient's last menstrual period was 07/24/2023 (approximate).  OBJECTIVE:  She appears well, afebrile. Urine dipstick:   ASSESSMENT:  Vaginal Discharge  Vaginal Odor   PLAN:  GC, chlamydia, trichomonas, BVAG, CVAG probe sent to lab. Urine Culture sent to lab Treatment: To be determined once lab results are received ROV prn if symptoms persist or worsen.

## 2023-08-16 ENCOUNTER — Encounter: Payer: Self-pay | Admitting: Internal Medicine

## 2023-08-16 ENCOUNTER — Other Ambulatory Visit: Payer: Self-pay

## 2023-08-16 ENCOUNTER — Ambulatory Visit (INDEPENDENT_AMBULATORY_CARE_PROVIDER_SITE_OTHER): Payer: Medicaid Other | Admitting: Internal Medicine

## 2023-08-16 VITALS — BP 104/70 | HR 84 | Temp 98.4°F | Ht 64.5 in | Wt 222.9 lb

## 2023-08-16 DIAGNOSIS — R0609 Other forms of dyspnea: Secondary | ICD-10-CM | POA: Diagnosis not present

## 2023-08-16 DIAGNOSIS — J45990 Exercise induced bronchospasm: Secondary | ICD-10-CM

## 2023-08-16 DIAGNOSIS — L500 Allergic urticaria: Secondary | ICD-10-CM | POA: Diagnosis not present

## 2023-08-16 DIAGNOSIS — J383 Other diseases of vocal cords: Secondary | ICD-10-CM

## 2023-08-16 LAB — CERVICOVAGINAL ANCILLARY ONLY
Bacterial Vaginitis (gardnerella): NEGATIVE
Chlamydia: NEGATIVE
Comment: NEGATIVE
Comment: NEGATIVE
Comment: NEGATIVE
Comment: NORMAL
Neisseria Gonorrhea: NEGATIVE
Trichomonas: NEGATIVE

## 2023-08-16 NOTE — Patient Instructions (Addendum)
Idiopathic Urticaria (Hives): - At this time etiology of hives and swelling is unknown. Hives can be caused by a variety of different triggers including illness/infection, exercise, pressure, vibrations, extremes of temperature to name a few however majority of the time there is no identifiable trigger.  -Hold all anti histamines (Zyrtec, Benadryl, Claritin, Allegra) 3 days prior to next visit.    Dyspnea - Likely vocal cord dysfunction based on sudden symptoms and lack of response to Albuterol  - Spirometry today was normal. Low suspicion for asthma.   Vocal Cord Dysfunction Breathing Exercises Use these breathing techniques at any sign of shortness of breath, wheezing, tightness or stridor/noisy breathing. If this occurs during activity, stop activity, do exercise until it stops and then resume activity gradually. Remember Tightness or stridor can be released by breathing exercises Do exercises easily - don't push shoulders or chest Concentrate on letting air in and out Go into new activities and sports gradually Diaphragmatic breathing exercises Do 10 cycles X3 Practice 2-3 times per day, lying down and sitting up & standing Concentrate on deep diaphragmatic breathing; relaxation of the entire upper body; and increased breath capacity Practice in a quiet environment to help encourage focus Have adult supervision until child can practice with ease on their own Once good breathing practice is established, practice more frequently throughout the day Review your "mental checklist" during breathing exercises Are my face, jaw, tongue relaxed? Is my throat open and relaxed? Are my shoulders relaxed and not moving? Is my chest relaxed and not moving? Is my diaphragm doing all the work: moving out for inhalation and in for exhalation? Is my breathing rate slow and rhythmic? Is my breath full and relaxed (can count to 2 seconds on inhalation and 4-5 seconds on exhalation) Swallow-breathe  technique Swallow followed by exhalation and initiation of diaphragmatic breathing. Do 10 full cycles of inhale/exhale. Continue with multiple cycles if needed, until the vocal cord dysfunction goes away. If a vocal cord dysfunction event refuses to be suppressed with this technique, analyze the problem more fully and consult medical assistance, if needed. Relaxed throat breath Do 5 of these relaxed throat breaths in the morning, at noon, before bedtime, before medications, as needed. Hand on abdomen (above the belt or both) when needed Inhale into abdomen- abdomen comes out Exhale from abdomen-abdomen comes in Inhale with relaxed throat: Tongue on floor of mouth Lips gently closed Jaw gently released Exhale     Follow up: 08/29/2022 at 10 AM for skin testing.
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
## 2023-08-17 LAB — URINE CULTURE

## 2023-08-18 ENCOUNTER — Ambulatory Visit: Payer: Medicaid Other | Admitting: Emergency Medicine

## 2023-08-18 ENCOUNTER — Other Ambulatory Visit (HOSPITAL_COMMUNITY)
Admission: RE | Admit: 2023-08-18 | Discharge: 2023-08-18 | Disposition: A | Discharge status: Home / Self Care | Payer: Medicaid Other | Source: Ambulatory Visit | Attending: Obstetrics & Gynecology | Admitting: Obstetrics & Gynecology

## 2023-08-18 DIAGNOSIS — N898 Other specified noninflammatory disorders of vagina: Secondary | ICD-10-CM | POA: Diagnosis not present

## 2023-08-18 NOTE — Progress Notes (Signed)
Pt desires repeat swab due to vaginal odor and discharge.

## 2023-08-22 ENCOUNTER — Other Ambulatory Visit: Payer: Self-pay

## 2023-08-22 ENCOUNTER — Encounter (HOSPITAL_COMMUNITY): Payer: Self-pay | Admitting: Obstetrics & Gynecology

## 2023-08-22 LAB — CERVICOVAGINAL ANCILLARY ONLY
Bacterial Vaginitis (gardnerella): NEGATIVE
Candida Glabrata: NEGATIVE
Candida Vaginitis: NEGATIVE
Chlamydia: NEGATIVE
Comment: NEGATIVE
Comment: NEGATIVE
Comment: NEGATIVE
Comment: NEGATIVE
Comment: NEGATIVE
Comment: NORMAL
Neisseria Gonorrhea: NEGATIVE
Trichomonas: NEGATIVE

## 2023-08-22 NOTE — Progress Notes (Signed)
PCP - [place]  Cardiologist - none  Chest x-ray - 02/08/23 EKG - 02/08/23 Stress Test - n/a ECHO - n/a Cardiac Cath - n/a  ICD Pacemaker/Loop - n/a  Sleep Study -  n/a  Diabetes - n/a  Aspirin Instructions: Follow your surgeon's instructions on when to stop aspirin prior to surgery,  If no instructions were given by your surgeon then you will need to call the office for those instructions.  ERAS: Clear liquids til 5:30 AM DOS.  Anesthesia review: Yes  STOP now taking any Aspirin (unless otherwise instructed by your surgeon), Aleve, Naproxen, Ibuprofen, Motrin, Advil, Goody's, BC's, all herbal medications, fish oil, and all vitamins.   Coronavirus Screening Do you have any of the following symptoms:  Cough yes/no: No Fever (>100.22F)  yes/no: No Runny nose yes/no: No Sore throat yes/no: No Difficulty breathing/shortness of breath  yes/no: No  Have you traveled in the last 14 days and where? yes/no: No  Patient verbalized understanding of instructions that were given via phone.
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
## 2023-08-22 NOTE — Anesthesia Preprocedure Evaluation (Signed)
Anesthesia Evaluation  Patient identified by MRN, date of birth, ID band Patient awake    Reviewed: Allergy & Precautions, NPO status , Patient's Chart, lab work & pertinent test results  History of Anesthesia Complications (+) history of anesthetic complications  Airway Mallampati: II  TM Distance: >3 FB     Dental no notable dental hx. (+) Teeth Intact Orthodontic appliances:   Pulmonary asthma , Patient abstained from smoking.   Pulmonary exam normal breath sounds clear to auscultation       Cardiovascular negative cardio ROS Normal cardiovascular exam Rhythm:Regular Rate:Normal     Neuro/Psych  Headaches PSYCHIATRIC DISORDERS Anxiety Depression Bipolar Disorder      GI/Hepatic negative GI ROS, Neg liver ROS,,,  Endo/Other  Obesity  Renal/GU negative Renal ROS  negative genitourinary   Musculoskeletal negative musculoskeletal ROS (+)    Abdominal  (+) + obese  Peds  Hematology negative hematology ROS (+)   Anesthesia Other Findings   Reproductive/Obstetrics Desires sterilization                             Anesthesia Physical Anesthesia Plan  ASA: 2  Anesthesia Plan: General   Post-op Pain Management: Minimal or no pain anticipated   Induction: Intravenous  PONV Risk Score and Plan: 4 or greater and Treatment may vary due to age or medical condition and Midazolam  Airway Management Planned: Oral ETT  Additional Equipment: None  Intra-op Plan:   Post-operative Plan: Extubation in OR  Informed Consent: I have reviewed the patients History and Physical, chart, labs and discussed the procedure including the risks, benefits and alternatives for the proposed anesthesia with the patient or authorized representative who has indicated his/her understanding and acceptance.     Dental advisory given  Plan Discussed with: CRNA and Anesthesiologist  Anesthesia Plan Comments:  (PAT note written 08/22/2023 by Shonna Chock, PA-C.  )       Anesthesia Quick Evaluation

## 2023-08-23 ENCOUNTER — Ambulatory Visit (HOSPITAL_BASED_OUTPATIENT_CLINIC_OR_DEPARTMENT_OTHER): Payer: Self-pay | Admitting: Vascular Surgery

## 2023-08-23 ENCOUNTER — Encounter (HOSPITAL_COMMUNITY)
Admission: RE | Disposition: A | Discharge status: Home / Self Care | Payer: Self-pay | Source: Home / Self Care | Attending: Obstetrics & Gynecology

## 2023-08-23 ENCOUNTER — Other Ambulatory Visit: Payer: Self-pay

## 2023-08-23 ENCOUNTER — Ambulatory Visit (HOSPITAL_COMMUNITY): Payer: Medicaid Other | Admitting: Vascular Surgery

## 2023-08-23 ENCOUNTER — Ambulatory Visit (HOSPITAL_COMMUNITY)
Admission: RE | Admit: 2023-08-23 | Discharge: 2023-08-23 | Disposition: A | Discharge status: Home / Self Care | Payer: Medicaid Other | Attending: Obstetrics & Gynecology | Admitting: Obstetrics & Gynecology

## 2023-08-23 ENCOUNTER — Encounter (HOSPITAL_COMMUNITY): Payer: Self-pay | Admitting: Obstetrics & Gynecology

## 2023-08-23 ENCOUNTER — Other Ambulatory Visit: Payer: Self-pay | Admitting: Obstetrics & Gynecology

## 2023-08-23 DIAGNOSIS — E669 Obesity, unspecified: Secondary | ICD-10-CM | POA: Insufficient documentation

## 2023-08-23 DIAGNOSIS — Z302 Encounter for sterilization: Secondary | ICD-10-CM | POA: Insufficient documentation

## 2023-08-23 DIAGNOSIS — Z6837 Body mass index (BMI) 37.0-37.9, adult: Secondary | ICD-10-CM | POA: Insufficient documentation

## 2023-08-23 DIAGNOSIS — Z3009 Encounter for other general counseling and advice on contraception: Secondary | ICD-10-CM | POA: Diagnosis not present

## 2023-08-23 DIAGNOSIS — F319 Bipolar disorder, unspecified: Secondary | ICD-10-CM | POA: Insufficient documentation

## 2023-08-23 DIAGNOSIS — Z01812 Encounter for preprocedural laboratory examination: Secondary | ICD-10-CM

## 2023-08-23 DIAGNOSIS — Z87891 Personal history of nicotine dependence: Secondary | ICD-10-CM | POA: Diagnosis not present

## 2023-08-23 DIAGNOSIS — J45909 Unspecified asthma, uncomplicated: Secondary | ICD-10-CM | POA: Diagnosis not present

## 2023-08-23 HISTORY — PX: LAPAROSCOPIC BILATERAL SALPINGECTOMY: SHX5889

## 2023-08-23 LAB — CBC
HCT: 40.8 % (ref 36.0–46.0)
Hemoglobin: 13 g/dL (ref 12.0–15.0)
MCH: 27.3 pg (ref 26.0–34.0)
MCHC: 31.9 g/dL (ref 30.0–36.0)
MCV: 85.5 fL (ref 80.0–100.0)
Platelets: 199 10*3/uL (ref 150–400)
RBC: 4.77 MIL/uL (ref 3.87–5.11)
RDW: 13.6 % (ref 11.5–15.5)
WBC: 4.3 10*3/uL (ref 4.0–10.5)
nRBC: 0 % (ref 0.0–0.2)

## 2023-08-23 LAB — POCT PREGNANCY, URINE: Preg Test, Ur: NEGATIVE

## 2023-08-23 SURGERY — SALPINGECTOMY, BILATERAL, LAPAROSCOPIC
Anesthesia: General | Laterality: Bilateral

## 2023-08-23 MED ORDER — ONDANSETRON HCL 4 MG/2ML IJ SOLN
INTRAMUSCULAR | Status: AC
  Filled 2023-08-23: qty 2

## 2023-08-23 MED ORDER — FENTANYL CITRATE (PF) 250 MCG/5ML IJ SOLN
INTRAMUSCULAR | Status: AC
  Filled 2023-08-23: qty 5

## 2023-08-23 MED ORDER — POVIDONE-IODINE 10 % EX SWAB
2.0000 | Freq: Once | CUTANEOUS | Status: AC
  Administered 2023-08-23: 2 via TOPICAL

## 2023-08-23 MED ORDER — HYDROCODONE-ACETAMINOPHEN 5-325 MG PO TABS
1.0000 | ORAL_TABLET | Freq: Four times a day (QID) | ORAL | 0 refills | Status: DC | PRN
Start: 2023-08-23 — End: 2023-08-23

## 2023-08-23 MED ORDER — IBUPROFEN 800 MG PO TABS: 800.0000 mg | ORAL_TABLET | Freq: Three times a day (TID) | ORAL | 0 refills | Status: DC | PRN

## 2023-08-23 MED ORDER — KETOROLAC TROMETHAMINE 30 MG/ML IJ SOLN
INTRAMUSCULAR | Status: AC
Start: 2023-08-23 — End: ?
  Filled 2023-08-23: qty 1

## 2023-08-23 MED ORDER — LACTATED RINGERS IV SOLN: INTRAVENOUS | Status: DC

## 2023-08-23 MED ORDER — EPHEDRINE 5 MG/ML INJ
INTRAVENOUS | Status: AC
  Filled 2023-08-23: qty 5

## 2023-08-23 MED ORDER — SUGAMMADEX SODIUM 200 MG/2ML IV SOLN
INTRAVENOUS | Status: DC | PRN
  Administered 2023-08-23: 200 mg via INTRAVENOUS

## 2023-08-23 MED ORDER — ROCURONIUM BROMIDE 10 MG/ML (PF) SYRINGE
PREFILLED_SYRINGE | INTRAVENOUS | Status: DC | PRN
  Administered 2023-08-23: 60 mg via INTRAVENOUS

## 2023-08-23 MED ORDER — CHLORHEXIDINE GLUCONATE 0.12 % MT SOLN
OROMUCOSAL | Status: AC
  Administered 2023-08-23: 15 mL via OROMUCOSAL
  Filled 2023-08-23: qty 15

## 2023-08-23 MED ORDER — KETOROLAC TROMETHAMINE 30 MG/ML IJ SOLN
INTRAMUSCULAR | Status: DC | PRN
  Administered 2023-08-23: 30 mg via INTRAVENOUS

## 2023-08-23 MED ORDER — DROPERIDOL 2.5 MG/ML IJ SOLN: 0.6250 mg | Freq: Once | INTRAMUSCULAR | Status: DC | PRN

## 2023-08-23 MED ORDER — ONDANSETRON HCL 4 MG/2ML IJ SOLN: 4.0000 mg | Freq: Once | INTRAMUSCULAR | Status: DC | PRN

## 2023-08-23 MED ORDER — DEXAMETHASONE SODIUM PHOSPHATE 10 MG/ML IJ SOLN
INTRAMUSCULAR | Status: AC
  Filled 2023-08-23: qty 1

## 2023-08-23 MED ORDER — ONDANSETRON HCL 4 MG/2ML IJ SOLN
INTRAMUSCULAR | Status: DC | PRN
  Administered 2023-08-23: 4 mg via INTRAVENOUS

## 2023-08-23 MED ORDER — PROPOFOL 10 MG/ML IV BOLUS
INTRAVENOUS | Status: AC
  Filled 2023-08-23: qty 20

## 2023-08-23 MED ORDER — MIDAZOLAM HCL 2 MG/2ML IJ SOLN
INTRAMUSCULAR | Status: DC | PRN
  Administered 2023-08-23: 2 mg via INTRAVENOUS

## 2023-08-23 MED ORDER — MIDAZOLAM HCL 2 MG/2ML IJ SOLN
INTRAMUSCULAR | Status: AC
  Filled 2023-08-23: qty 2

## 2023-08-23 MED ORDER — ROCURONIUM BROMIDE 10 MG/ML (PF) SYRINGE
PREFILLED_SYRINGE | INTRAVENOUS | Status: AC
Start: 2023-08-23 — End: ?
  Filled 2023-08-23: qty 10

## 2023-08-23 MED ORDER — DEXAMETHASONE SODIUM PHOSPHATE 10 MG/ML IJ SOLN
INTRAMUSCULAR | Status: DC | PRN
  Administered 2023-08-23: 10 mg via INTRAVENOUS

## 2023-08-23 MED ORDER — OXYCODONE HCL 5 MG PO TABS
5.0000 mg | ORAL_TABLET | Freq: Once | ORAL | Status: AC | PRN
  Administered 2023-08-23: 5 mg via ORAL

## 2023-08-23 MED ORDER — PHENYLEPHRINE 80 MCG/ML (10ML) SYRINGE FOR IV PUSH (FOR BLOOD PRESSURE SUPPORT)
PREFILLED_SYRINGE | INTRAVENOUS | Status: AC
  Filled 2023-08-23: qty 10

## 2023-08-23 MED ORDER — LIDOCAINE 2% (20 MG/ML) 5 ML SYRINGE
INTRAMUSCULAR | Status: DC | PRN
  Administered 2023-08-23: 100 mg via INTRAVENOUS

## 2023-08-23 MED ORDER — BUPIVACAINE HCL (PF) 0.25 % IJ SOLN
INTRAMUSCULAR | Status: AC
  Filled 2023-08-23: qty 30

## 2023-08-23 MED ORDER — LIDOCAINE 2% (20 MG/ML) 5 ML SYRINGE
INTRAMUSCULAR | Status: AC
  Filled 2023-08-23: qty 5

## 2023-08-23 MED ORDER — ACETAMINOPHEN 10 MG/ML IV SOLN
INTRAVENOUS | Status: DC | PRN
  Administered 2023-08-23: 1000 mg via INTRAVENOUS

## 2023-08-23 MED ORDER — OXYCODONE HCL 5 MG PO TABS
ORAL_TABLET | ORAL | Status: AC
  Filled 2023-08-23: qty 1

## 2023-08-23 MED ORDER — BUPIVACAINE HCL (PF) 0.25 % IJ SOLN
INTRAMUSCULAR | Status: DC | PRN
  Administered 2023-08-23: 10 mL

## 2023-08-23 MED ORDER — PROPOFOL 10 MG/ML IV BOLUS
INTRAVENOUS | Status: DC | PRN
  Administered 2023-08-23: 180 mg via INTRAVENOUS

## 2023-08-23 MED ORDER — CHLORHEXIDINE GLUCONATE 0.12 % MT SOLN
15.0000 mL | Freq: Once | OROMUCOSAL | Status: AC
Start: 2023-08-23 — End: 2023-08-23

## 2023-08-23 MED ORDER — OXYCODONE HCL 5 MG/5ML PO SOLN: 5.0000 mg | Freq: Once | ORAL | Status: AC | PRN

## 2023-08-23 MED ORDER — FENTANYL CITRATE (PF) 250 MCG/5ML IJ SOLN
INTRAMUSCULAR | Status: DC | PRN
  Administered 2023-08-23: 150 ug via INTRAVENOUS
  Administered 2023-08-23: 100 ug via INTRAVENOUS

## 2023-08-23 MED ORDER — HYDROCODONE-ACETAMINOPHEN 5-325 MG PO TABS: 1.0000 | ORAL_TABLET | Freq: Four times a day (QID) | ORAL | 0 refills | Status: DC | PRN

## 2023-08-23 MED ORDER — KETAMINE HCL 10 MG/ML IJ SOLN: INTRAMUSCULAR | Status: DC | PRN

## 2023-08-23 MED ORDER — FENTANYL CITRATE (PF) 100 MCG/2ML IJ SOLN
25.0000 ug | INTRAMUSCULAR | Status: DC | PRN
  Administered 2023-08-23 (×2): 50 ug via INTRAVENOUS

## 2023-08-23 MED ORDER — FENTANYL CITRATE (PF) 100 MCG/2ML IJ SOLN
INTRAMUSCULAR | Status: AC
  Filled 2023-08-23: qty 2

## 2023-08-23 MED ORDER — ORAL CARE MOUTH RINSE: 15.0000 mL | Freq: Once | OROMUCOSAL | Status: AC

## 2023-08-23 SURGICAL SUPPLY — 40 items
ADH SKN CLS APL DERMABOND .7 (GAUZE/BANDAGES/DRESSINGS) ×1
APL SKNCLS STERI-STRIP NONHPOA (GAUZE/BANDAGES/DRESSINGS)
BENZOIN TINCTURE PRP APPL 2/3 (GAUZE/BANDAGES/DRESSINGS) IMPLANT
CABLE HIGH FREQUENCY MONO STRZ (ELECTRODE) ×1 IMPLANT
CATH ROBINSON RED A/P 16FR (CATHETERS) IMPLANT
DERMABOND ADVANCED .7 DNX12 (GAUZE/BANDAGES/DRESSINGS) IMPLANT
DRSG OPSITE POSTOP 3X4 (GAUZE/BANDAGES/DRESSINGS) IMPLANT
DURAPREP 26ML APPLICATOR (WOUND CARE) ×1 IMPLANT
FORCEPS CUTTING 33CM 5MM (CUTTING FORCEPS) IMPLANT
GLOVE BIOGEL PI IND STRL 7.0 (GLOVE) ×4 IMPLANT
GLOVE ECLIPSE 6.5 STRL STRAW (GLOVE) ×1 IMPLANT
GOWN STRL REUS W/ TWL LRG LVL3 (GOWN DISPOSABLE) ×1 IMPLANT
GOWN STRL REUS W/TWL LRG LVL3 (GOWN DISPOSABLE) ×1
IRRIG SUCT STRYKERFLOW 2 WTIP (MISCELLANEOUS)
IRRIGATION SUCT STRKRFLW 2 WTP (MISCELLANEOUS) IMPLANT
KIT PINK PAD W/HEAD ARE REST (MISCELLANEOUS) ×1
KIT PINK PAD W/HEAD ARM REST (MISCELLANEOUS) ×1 IMPLANT
KIT TURNOVER KIT B (KITS) ×1 IMPLANT
LIGASURE VESSEL 5MM BLUNT TIP (ELECTROSURGICAL) IMPLANT
NDL INSUFFLATION 14GA 120MM (NEEDLE) ×1 IMPLANT
NEEDLE INSUFFLATION 14GA 120MM (NEEDLE) ×1 IMPLANT
PACK LAPAROSCOPY BASIN (CUSTOM PROCEDURE TRAY) ×1 IMPLANT
POUCH LAPAROSCOPIC INSTRUMENT (MISCELLANEOUS) ×1 IMPLANT
PROTECTOR NERVE ULNAR (MISCELLANEOUS) ×2 IMPLANT
SET TUBE SMOKE EVAC HIGH FLOW (TUBING) ×1 IMPLANT
SHEARS HARMONIC ACE PLUS 36CM (ENDOMECHANICALS) IMPLANT
SLEEVE ADV FIXATION 5X100MM (TROCAR) IMPLANT
SLEEVE Z-THREAD 5X100MM (TROCAR) IMPLANT
STRIP CLOSURE SKIN 1/2X4 (GAUZE/BANDAGES/DRESSINGS) IMPLANT
SUT VICRYL 0 UR6 27IN ABS (SUTURE) IMPLANT
SUT VICRYL 4-0 PS2 18IN ABS (SUTURE) ×1 IMPLANT
SYR 30ML LL (SYRINGE) IMPLANT
SYS BAG RETRIEVAL 10MM (BASKET)
SYSTEM BAG RETRIEVAL 10MM (BASKET) IMPLANT
TOWEL GREEN STERILE FF (TOWEL DISPOSABLE) ×2 IMPLANT
TRAY FOLEY W/BAG SLVR 14FR (SET/KITS/TRAYS/PACK) ×1 IMPLANT
TROCAR 11X100 Z THREAD (TROCAR) IMPLANT
TROCAR ADV FIXATION 5X100MM (TROCAR) ×1 IMPLANT
TROCAR XCEL NON-BLD 5MMX100MML (ENDOMECHANICALS) ×1 IMPLANT
WARMER LAPAROSCOPE (MISCELLANEOUS) ×1 IMPLANT

## 2023-08-23 NOTE — Discharge Instructions (Addendum)
Post-surgical Instructions, Outpatient Surgery ° °You may expect to feel dizzy, weak, and drowsy for as long as 24 hours after receiving the medicine that made you sleep (anesthetic). For the first 24 hours after your surgery:   °Do not drive a car, ride a bicycle, participate in physical activities, or take public transportation until you are done taking narcotic pain medicines or as directed by Dr. Horton Ellithorpe.  °Do not drink alcohol or take tranquilizers.  °Do not take medicine that has not been prescribed by your physicians.  °Do not sign important papers or make important decisions while on narcotic pain medicines.  °Have a responsible person with you.  ° °CARE OF INCISION °If you have a bandage, you may remove it in one day.  If there are steri-strips or dermabond, just let this loosen on its own.  °You may shower on the first day after your surgery.  Do not sit in a tub bath for one week. °Avoid heavy lifting (more than 10 pounds/4.5 kilograms), pushing, or pulling.  °Avoid activities that may risk injury to your incisions.  ° °PAIN MANAGEMENT °Motrin 800mg.  (This is the same as 4-200mg over the counter tablets of Motrin or ibuprofen.)  You may take this every eight hours or as needed for cramping.   °Vicodin 5/325mg.  For more severe pain, take one or two tablets every four to six hours as needed for pain control.  (Remember that narcotic pain medications increase your risk of constipation.  If this becomes a problem, you may take an over the counter stool softener like Colace 100mg up to four times a day.) ° °DO'S AND DON'T'S °Do not take a tub bath for one week.  You may shower on the first day after your surgery °Do not do any heavy lifting for one to two weeks.  This increases the chance of bleeding. °Do move around as you feel able.  Stairs are fine.  You may begin to exercise again as you feel able.  Do not lift any weights for two weeks. °Do not put anything in the vagina for two weeks--no tampons,  intercourse, or douching.   ° °REGULAR MEDIATIONS/VITAMINS: °You may restart all of your regular medications as prescribed. °You may restart all of your vitamins as you normally take them.   ° °PLEASE CALL OR SEEK MEDICAL CARE IF: °You have persistent nausea and vomiting.  °You have trouble eating or drinking.  °You have an oral temperature above 100.5.  °You have constipation that is not helped by adjusting diet or increasing fluid intake. Pain medicines are a common cause of constipation.  °You have heavy vaginal bleeding °You have redness or drainage from your incision(s) or there is increasing pain or tenderness near or in the surgical site.  °  °

## 2023-08-23 NOTE — Anesthesia Procedure Notes (Signed)
Procedure Name: Intubation Date/Time: 08/23/2023 9:21 AM  Performed by: Alwyn Ren, CRNAPre-anesthesia Checklist: Patient identified, Emergency Drugs available, Suction available and Patient being monitored Patient Re-evaluated:Patient Re-evaluated prior to induction Oxygen Delivery Method: Circle system utilized Preoxygenation: Pre-oxygenation with 100% oxygen Induction Type: IV induction Ventilation: Mask ventilation without difficulty Laryngoscope Size: Miller and 2 Grade View: Grade I Tube type: Oral Tube size: 7.0 mm Number of attempts: 1 Airway Equipment and Method: Stylet and Oral airway Placement Confirmation: ETT inserted through vocal cords under direct vision, positive ETCO2 and breath sounds checked- equal and bilateral Secured at: 22 cm Tube secured with: Tape Dental Injury: Teeth and Oropharynx as per pre-operative assessment

## 2023-08-23 NOTE — Op Note (Signed)
08/23/2023  10:23 AM  PATIENT:  Andrea Jenkins  27 y.o. female  PRE-OPERATIVE DIAGNOSIS:  Undesired Fertility  POST-OPERATIVE DIAGNOSIS:  Undesired Fertility  PROCEDURE:  Procedure(s): LAPAROSCOPIC BILATERAL SALPINGECTOMY  SURGEON:  Jerene Bears  ASSISTANTS: OR staff and Achille Rich, [facility] medical student.    ANESTHESIA:   general  ESTIMATED BLOOD LOSS: 5 mL  BLOOD ADMINISTERED:none   FLUIDS: 1000ccLR  UOP: 200cc clear UOP  SPECIMEN:  bilateral fallopian tubes  DISPOSITION OF SPECIMEN:  PATHOLOGY  FINDINGS: normal pelvic, normal upper abdomen, left paratubal cyst, left functional ovarian cyst  DESCRIPTION OF OPERATION: Patient is taken to the operating room. She is placed in the supine position. She is a running IV in place. Informed consent was present on the chart. SCDs on her lower extremities and functioning properly. Patient was positioned while she was awake.  Her legs were placed in the low lithotomy position in [place] stirrups. Her arms were tucked by the side.  General endotracheal anesthesia was administered by the anesthesia staff without difficulty. Dr. Malen Gauze, anesthesia, oversaw case.  Time out performed.    Clora prep was then used to prep the abdomen and Betadine was used to prep the inner thighs, perineum and vagina. Once 3 minutes had past the patient was draped in a normal standard fashion. The legs were lifted to the high lithotomy position. The cervix was visualized by placing a heavy weighted speculum in the posterior aspect of the vagina and using a curved Deaver retractor to the retract anteriorly. The anterior lip of the cervix was grasped with single-tooth tenaculum.  Hulka clamp was passed through the cervical os and attached to the anterior lip of the cervix.  The tenaculum was removed. Speculum was removed.  A Foley catheter was placed to straight drain.  Clear urine was noted. Legs were lowered to the low lithotomy position and attention was turned  the abdomen.  The umbilicus was everted.  Marcaine 0.25% used to anesthetize the skin.  Using #11 blade, 5mm skin incision was made.  A Veress needle was obtained. Syringe of sterile saline was placed on a open Veress needle.  With the abdomen elevated, the Veress needle was passed into the umbilicus until the pop was heard and then fluid started to drip.  Then low flow CO2 gas was attached the needle and the pneumoperitoneum was achieved without difficulty. Once four liters of gas was in the abdomen the Veress needle was removed and a 5 millimeter non-bladed Optiview trocar and port were passed directly to the abdomen. The laparoscope was then used to confirm intraperitoneal placement. Findings included normal pelvis and normal upper abdomen.  Locations for RLQ and LLQ ports were noted by transillumination of the abdominal wall.  0.25% marcaine was used to anesthetize the skin. 5mm skin incision was made in the RLQ and LLQ.  5mm nonbladed trochars and ports were placed in the RLQ and LLQ under direct visualization.  All trochars were removed.    Ureters were identifies.  Attention was turned to the left side. With uterus on stretch the left tube was excised off the ovary and mesosalpinx was dissected to free the tube. The tube was clamped at the edge of the uterus, cauterized and incised with the ligasure device.  Tube was free at this point and delivered through the LLQ port.    Attention was turned the right side.  The uterus was placed on stretch to the opposite side.  The tube was excised off the  ovary using sharp dissection a bipolar cautery.  The mesosalpinx was incised freeing the tube.  In a similar fashion the tube was clamped, cauterized and incised at the edge of the uterus with the ligasure device, freeing the tube.  This was delivered through the RLQ port.    At this point, the procedure was ended.  The Co2 was placed on 8mm Hg.  No bleeding was noted.  LLQ and RLQ ports were removed under  direct visualization of the laparoscopic.  Pneumoperitoneum was relived.  Pt taken out of trendelenburg positioning.  The CRNA gave several deep breaths to try and get any remaining Co2 from the abdomen/pelvis.  Then the midline port was removed.  Incisions were then closed with #2.0 Vicryl and dermabond was then placed across the incisions.    Attention was turned to the pelvis.  The Hulka clamp was removed.  Minimal bleeding was noted.  The Foley catheter was removed.  Sponge, lap, needle, instrument counts were correct x2. Patient tolerated the procedure very well. She was awakened from anesthesia, extubated and taken to recovery in stable condition.   COUNTS:  YES  PLAN OF CARE: Transfer to PACU

## 2023-08-23 NOTE — Transfer of Care (Signed)
Immediate Anesthesia Transfer of Care Note  Patient: Kathryne Hitch Turko  Procedure(s) Performed: LAPAROSCOPIC BILATERAL SALPINGECTOMY (Bilateral)  Patient Location: PACU  Anesthesia Type:General  Level of Consciousness: awake and alert   Airway & Oxygen Therapy: Patient Spontanous Breathing  Post-op Assessment: Report given to RN and Post -op Vital signs reviewed and stable  Post vital signs: Reviewed and stable  Last Vitals:  Vitals Value Taken Time  BP 126/88 08/23/23 1038  Temp    Pulse 106 08/23/23 1042  Resp 19 08/23/23 1043  SpO2 98 % 08/23/23 1042  Vitals shown include unfiled device data.  Last Pain:  Vitals:   08/23/23 0707  TempSrc:   PainSc: 0-No pain         Complications: No notable events documented.
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
## 2023-08-23 NOTE — Anesthesia Postprocedure Evaluation (Signed)
Anesthesia Post Note  Patient: Andrea Jenkins  Procedure(s) Performed: LAPAROSCOPIC BILATERAL SALPINGECTOMY (Bilateral)     Patient location during evaluation: PACU Anesthesia Type: General Level of consciousness: awake and alert and oriented Pain management: pain level controlled Vital Signs Assessment: post-procedure vital signs reviewed and stable Respiratory status: spontaneous breathing, nonlabored ventilation and respiratory function stable Cardiovascular status: blood pressure returned to baseline and stable Postop Assessment: no apparent nausea or vomiting Anesthetic complications: no   No notable events documented.  Last Vitals:  Vitals:   08/23/23 1045 08/23/23 1100  BP: 131/87 126/89  Pulse: 97 84  Resp: 20 19  Temp:    SpO2: 100% 98%    Last Pain:  Vitals:   08/23/23 1100  TempSrc:   PainSc: 6                  [name] A.

## 2023-08-24 ENCOUNTER — Encounter (HOSPITAL_COMMUNITY): Payer: Self-pay | Admitting: Obstetrics & Gynecology

## 2023-08-24 LAB — MOLECULAR ANCILLARY ONLY
Chlamydia: NEGATIVE
Comment: NEGATIVE
Comment: NORMAL
Neisseria Gonorrhea: NEGATIVE

## 2023-08-24 LAB — SURGICAL PATHOLOGY

## 2023-08-30 ENCOUNTER — Ambulatory Visit (INDEPENDENT_AMBULATORY_CARE_PROVIDER_SITE_OTHER): Payer: Medicaid Other | Admitting: Internal Medicine

## 2023-08-30 DIAGNOSIS — L500 Allergic urticaria: Secondary | ICD-10-CM | POA: Diagnosis not present
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
## 2023-08-30 NOTE — Patient Instructions (Addendum)
Urticaria (Hives): - SPT 08/2023: positive for trees, grasses, weeds, molds; negative for commonly allergenic foods. - At this time etiology of hives and swelling is unknown. Hives can be caused by a variety of different triggers including illness/infection, exercise, pressure, vibrations, extremes of temperature to name a few however majority of the time there is no identifiable trigger.  -Start Zyrtec 10mg  daily.  -If no improvement in 3-4 days, increase to Zyrtec 10mg  twice daily.   -If no improvement in 3-4 days, add Pepcid 20mg  twice daily and continue Zyrtec 10mg  twice daily.   Dyspnea/Vocal Cord Dysfunction  - Likely vocal cord dysfunction based on sudden symptoms and lack of response to Albuterol  - Spirometry previously was normal. Low suspicion for asthma.   Vocal Cord Dysfunction Breathing Exercises Use these breathing techniques at any sign of shortness of breath, wheezing, tightness or stridor/noisy breathing. If this occurs during activity, stop activity, do exercise until it stops and then resume activity gradually. Remember Tightness or stridor can be released by breathing exercises Do exercises easily - don't push shoulders or chest Concentrate on letting air in and out Go into new activities and sports gradually Diaphragmatic breathing exercises Do 10 cycles X3 Practice 2-3 times per day, lying down and sitting up & standing Concentrate on deep diaphragmatic breathing; relaxation of the entire upper body; and increased breath capacity Practice in a quiet environment to help encourage focus Have adult supervision until child can practice with ease on their own Once good breathing practice is established, practice more frequently throughout the day Review your "mental checklist" during breathing exercises Are my face, jaw, tongue relaxed? Is my throat open and relaxed? Are my shoulders relaxed and not moving? Is my chest relaxed and not moving? Is my diaphragm doing all  the work: moving out for inhalation and in for exhalation? Is my breathing rate slow and rhythmic? Is my breath full and relaxed (can count to 2 seconds on inhalation and 4-5 seconds on exhalation) Swallow-breathe technique Swallow followed by exhalation and initiation of diaphragmatic breathing. Do 10 full cycles of inhale/exhale. Continue with multiple cycles if needed, until the vocal cord dysfunction goes away. If a vocal cord dysfunction event refuses to be suppressed with this technique, analyze the problem more fully and consult medical assistance, if needed. Relaxed throat breath Do 5 of these relaxed throat breaths in the morning, at noon, before bedtime, before medications, as needed. Hand on abdomen (above the belt or both) when needed Inhale into abdomen- abdomen comes out Exhale from abdomen-abdomen comes in Inhale with relaxed throat: Tongue on floor of mouth Lips gently closed Jaw gently released Exhale

## 2023-08-31 ENCOUNTER — Encounter (HOSPITAL_BASED_OUTPATIENT_CLINIC_OR_DEPARTMENT_OTHER): Payer: Self-pay | Admitting: Obstetrics & Gynecology

## 2023-08-31 ENCOUNTER — Telehealth (HOSPITAL_BASED_OUTPATIENT_CLINIC_OR_DEPARTMENT_OTHER): Payer: Medicaid Other | Admitting: Obstetrics & Gynecology

## 2023-08-31 DIAGNOSIS — Z8742 Personal history of other diseases of the female genital tract: Secondary | ICD-10-CM

## 2023-08-31 DIAGNOSIS — Z9851 Tubal ligation status: Secondary | ICD-10-CM

## 2023-08-31 NOTE — Progress Notes (Signed)
Virtual Visit via Video Note  I connected with Andrea Jenkins on 08/31/23 at  1:00 PM EST by a video enabled telemedicine application and verified that I am speaking with the correct person using two identifiers.  Location: Patient: home Provider: office   I discussed the limitations of evaluation and management by telemedicine and the availability of in person appointments. The patient expressed understanding and agreed to proceed.  History of Present Illness: 27 yo G4P4 s/p laparoscopic BTL here for virtual visit due to some concerns about incisions.  The glue came off and the incisions are a little open but there is no redness or tenderness and no drainage.  She went back to work on Friday and took some pain medication the day after surgery but nothing since.  She has no other concerns.  Pathology reviewed.  Pt requested STI testing day of surgery and she is aware this was negative as well.    Pt has questions about abnormal pap from 2017.  She had LGSIL pap with +gonorrhea at that time.  Follow up pap 01/2018 was normal and then repeat pap 2022 was normal.  Pt aware needs to have repeat around 03/2024.  She will return to [facility] for this.     Observations/Objective: WNWD AA female Incisions are superficially open but C/D/I.  No erythema/drainage/bleeding present.  Pt reassured  Assessment and Plan: 1. H/O tubal ligation - incisions healing well.  Care of incisions reviewed.  Advised pt to call with any new concerns - otherwise, doing well  2. History of abnormal cervical Pap smear - pt knows to have repeat pap 03/2024   Follow Up Instructions: I discussed the assessment and treatment plan with the patient. The patient was provided an opportunity to ask questions and all were answered. The patient agreed with the plan and demonstrated an understanding of the instructions.   The patient was advised to call back or seek an in-person evaluation if the symptoms worsen or if the condition  fails to improve as anticipated.  I provided 12 minutes of non-face-to-face time during this encounter.   Jerene Bears, MD

## 2023-09-06 ENCOUNTER — Ambulatory Visit: Payer: Medicaid Other | Admitting: Internal Medicine

## 2023-09-08 ENCOUNTER — Encounter: Payer: Medicaid Other | Admitting: Student

## 2023-09-16 ENCOUNTER — Ambulatory Visit: Payer: Medicaid Other

## 2023-09-16 ENCOUNTER — Other Ambulatory Visit (HOSPITAL_COMMUNITY)
Admission: RE | Admit: 2023-09-16 | Discharge: 2023-09-16 | Disposition: A | Discharge status: Home / Self Care | Payer: Medicaid Other | Source: Ambulatory Visit | Attending: Obstetrics and Gynecology | Admitting: Obstetrics and Gynecology

## 2023-09-16 VITALS — BP 121/79 | HR 81 | Wt 218.0 lb

## 2023-09-16 DIAGNOSIS — Z113 Encounter for screening for infections with a predominantly sexual mode of transmission: Secondary | ICD-10-CM

## 2023-09-16 NOTE — Progress Notes (Signed)
..  SUBJECTIVE:  27 y.o. female complains of  vaginal itching and irritation . Denies abnormal vaginal bleeding or significant pelvic pain or fever. No UTI symptoms. Denies history of known exposure to STD.  No LMP recorded.  OBJECTIVE:  She appears well, afebrile. Urine dipstick: not done.  ASSESSMENT:  Vaginal Itching Vaginal Irritation   PLAN:  GC, chlamydia, trichomonas, BVAG, CVAG probe, and HIV, RPR, Hep B, and Hep C blood work sent to lab. Treatment: To be determined once lab results are received ROV prn if symptoms persist or worsen.

## 2023-09-17 LAB — HEPATITIS C ANTIBODY: Hep C Virus Ab: NONREACTIVE

## 2023-09-17 LAB — HIV ANTIBODY (ROUTINE TESTING W REFLEX): HIV Screen 4th Generation wRfx: NONREACTIVE

## 2023-09-17 LAB — RPR: RPR Ser Ql: NONREACTIVE

## 2023-09-17 LAB — HEPATITIS B SURFACE ANTIGEN: Hepatitis B Surface Ag: NEGATIVE

## 2023-09-19 LAB — CERVICOVAGINAL ANCILLARY ONLY
Bacterial Vaginitis (gardnerella): POSITIVE — AB
Candida Glabrata: NEGATIVE
Candida Vaginitis: POSITIVE — AB
Chlamydia: NEGATIVE
Comment: NEGATIVE
Comment: NEGATIVE
Comment: NEGATIVE
Comment: NEGATIVE
Comment: NEGATIVE
Comment: NORMAL
Neisseria Gonorrhea: NEGATIVE
Trichomonas: NEGATIVE

## 2023-09-20 ENCOUNTER — Other Ambulatory Visit: Payer: Self-pay

## 2023-09-20 ENCOUNTER — Encounter: Payer: Self-pay | Admitting: Obstetrics and Gynecology

## 2023-09-20 DIAGNOSIS — B9689 Other specified bacterial agents as the cause of diseases classified elsewhere: Secondary | ICD-10-CM

## 2023-09-20 DIAGNOSIS — B379 Candidiasis, unspecified: Secondary | ICD-10-CM

## 2023-09-20 MED ORDER — FLUCONAZOLE 150 MG PO TABS: 150.0000 mg | ORAL_TABLET | Freq: Once | ORAL | 0 refills | Status: AC

## 2023-09-20 MED ORDER — CLINDAMYCIN HCL 300 MG PO CAPS: 300.0000 mg | ORAL_CAPSULE | Freq: Two times a day (BID) | ORAL | 0 refills | Status: DC

## 2023-09-21 ENCOUNTER — Encounter (HOSPITAL_COMMUNITY): Payer: Self-pay

## 2023-10-07 ENCOUNTER — Ambulatory Visit: Payer: Medicaid Other | Admitting: Student

## 2023-10-07 DIAGNOSIS — E669 Obesity, unspecified: Secondary | ICD-10-CM

## 2023-10-07 DIAGNOSIS — N898 Other specified noninflammatory disorders of vagina: Secondary | ICD-10-CM

## 2023-10-07 DIAGNOSIS — F3162 Bipolar disorder, current episode mixed, moderate: Secondary | ICD-10-CM | POA: Diagnosis not present
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
## 2023-10-07 NOTE — Patient Instructions (Signed)
Thank you, Andrea Jenkins for allowing [place] to provide your care today.   Follow up: 1 week   Remember:  To head to [facility] on [facility]. You mentioned you were going to go today around 7pm.   Use the lubricant gels provided to you during your visit. Please follow up with OB Gyn with regards to this.   For your weight loss, we are referring you to the weight loss clinic.   Should you have any questions, concerns, or you do not hear back from your referrals please call the internal medicine clinic at [phone].     Manuela Neptune, MD [facility]
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
## 2023-10-07 NOTE — Assessment & Plan Note (Addendum)
Last seen in May. She is requesting Phentermine, and she would not be a candidate for this with poorly controlled Bipolar sxs. She has implemented some changes in her routine, like exercising every day Monday to Friday. She does state she is taking her Orlistat daily and gets loose stools when she takes it. Bowel sounds hypoactive on exam. Has increased about 15 lbs since last visit. Has not changed her diet. Went to the bariatric surgery information session and unfortunately her insurance did not get the surgery covered. I do think that she would benefit from referral to the weight loss clinic. TSH and Hgb A1c this year have been WNL.   -Referral to weight management clinic

## 2023-10-07 NOTE — Assessment & Plan Note (Signed)
Patient states that lubricants OTC have not helped for her chronic vaginal dryness.Has dyspareunia. Denies vaginal discharge. G/C testing has been negative. I gave her some samples of the surgical lube so she can have with her in the meantime. She saw ObGyn and I think she would benefit from further evaluation of this.   -Follow up with Ob Gyn

## 2023-10-10 NOTE — Telephone Encounter (Signed)
Opened in error

## 2023-10-11 ENCOUNTER — Ambulatory Visit: Payer: Medicaid Other | Admitting: Internal Medicine

## 2023-10-21 NOTE — Progress Notes (Signed)
Internal Medicine Clinic Attending  I was physically present during the key portions of the resident provided service and participated in the medical decision making of patient's management care. I reviewed pertinent patient test results.  The assessment, diagnosis, and plan were formulated together and I agree with the documentation in the resident's note.  Confirm that patient exhibited stable mood during the visit, did not endorse HI/SI, and stated that she would go to [facility] after work.  I contacted [facility] by phone to ensure availability.   Miguel Aschoff, MD

## 2023-10-25 ENCOUNTER — Ambulatory Visit: Payer: Medicaid Other

## 2023-10-25 ENCOUNTER — Other Ambulatory Visit (HOSPITAL_COMMUNITY)
Admission: RE | Admit: 2023-10-25 | Discharge: 2023-10-25 | Disposition: A | Discharge status: Home / Self Care | Payer: Medicaid Other | Source: Ambulatory Visit | Attending: Obstetrics & Gynecology | Admitting: Obstetrics & Gynecology

## 2023-10-25 DIAGNOSIS — N898 Other specified noninflammatory disorders of vagina: Secondary | ICD-10-CM | POA: Diagnosis not present

## 2023-10-25 MED ORDER — FLUCONAZOLE 150 MG PO TABS: 150.0000 mg | ORAL_TABLET | Freq: Once | ORAL | 0 refills | Status: DC

## 2023-10-25 NOTE — Progress Notes (Signed)
..  SUBJECTIVE:  27 y.o. female complains of white and thick vaginal discharge and itching for 1 week(s). Denies abnormal vaginal bleeding or significant pelvic pain or fever. No UTI symptoms. Denies history of known exposure to STD. Pt requests STD testing today and states that she used Monistat last night.  No LMP recorded.  OBJECTIVE:  She appears well, afebrile. Urine dipstick: not done.  ASSESSMENT:  Vaginal Discharge  Vaginal Itching   PLAN:  GC, chlamydia, trichomonas, BVAG, CVAG probe sent to lab. Treatment: To be determined once lab results are received ROV prn if symptoms persist or worsen. Advised pt that results could potentially be altered from using Monistat, pt voiced understanding.

## 2023-10-27 LAB — CERVICOVAGINAL ANCILLARY ONLY
Bacterial Vaginitis (gardnerella): NEGATIVE
Candida Glabrata: NEGATIVE
Candida Vaginitis: POSITIVE — AB
Chlamydia: NEGATIVE
Comment: NEGATIVE
Comment: NEGATIVE
Comment: NEGATIVE
Comment: NEGATIVE
Comment: NEGATIVE
Comment: NORMAL
Neisseria Gonorrhea: NEGATIVE
Trichomonas: NEGATIVE

## 2023-11-02 ENCOUNTER — Other Ambulatory Visit: Payer: Self-pay

## 2023-11-02 DIAGNOSIS — N898 Other specified noninflammatory disorders of vagina: Secondary | ICD-10-CM

## 2023-11-02 MED ORDER — FLUCONAZOLE 150 MG PO TABS: 150.0000 mg | ORAL_TABLET | Freq: Once | ORAL | 0 refills | Status: DC

## 2023-11-02 NOTE — Progress Notes (Signed)
 Pt called asking for rx for yeast after +results on 10/25/23. Sent rx per protocol.

## 2023-11-30 ENCOUNTER — Other Ambulatory Visit: Payer: Self-pay

## 2023-11-30 DIAGNOSIS — N898 Other specified noninflammatory disorders of vagina: Secondary | ICD-10-CM

## 2023-11-30 MED ORDER — FLUCONAZOLE 150 MG PO TABS: 150.0000 mg | ORAL_TABLET | Freq: Once | ORAL | 0 refills | Status: AC

## 2023-12-05 ENCOUNTER — Ambulatory Visit: Payer: Medicaid Other

## 2023-12-14 ENCOUNTER — Ambulatory Visit: Payer: Medicaid Other

## 2023-12-20 ENCOUNTER — Encounter (INDEPENDENT_AMBULATORY_CARE_PROVIDER_SITE_OTHER): Payer: Medicaid Other | Admitting: Physician Assistant

## 2023-12-20 ENCOUNTER — Other Ambulatory Visit (HOSPITAL_COMMUNITY)
Admission: RE | Admit: 2023-12-20 | Discharge: 2023-12-20 | Disposition: A | Discharge status: Home / Self Care | Payer: Medicaid Other | Source: Ambulatory Visit | Attending: Obstetrics and Gynecology | Admitting: Obstetrics and Gynecology

## 2023-12-20 ENCOUNTER — Ambulatory Visit: Payer: Medicaid Other | Admitting: General Practice

## 2023-12-20 VITALS — BP 135/89 | HR 78

## 2023-12-20 DIAGNOSIS — Z113 Encounter for screening for infections with a predominantly sexual mode of transmission: Secondary | ICD-10-CM | POA: Insufficient documentation

## 2023-12-20 MED ORDER — FLUCONAZOLE 150 MG PO TABS
150.0000 mg | ORAL_TABLET | Freq: Once | ORAL | 0 refills | Status: AC
Start: 2023-12-20 — End: 2023-12-20

## 2023-12-20 NOTE — Progress Notes (Signed)
 SUBJECTIVE:  28 y.o. female complains of copious vaginal discharge for 1 week(s). Denies abnormal vaginal bleeding or significant pelvic pain or fever. No UTI symptoms. Denies history of known exposure to STD.  No LMP recorded.  OBJECTIVE:  She appears well, afebrile. Urine dipstick: not done.  ASSESSMENT:  Vaginal Discharge  Vaginal Odor   PLAN:  GC, chlamydia, trichomonas, BVAG, CVAG probe sent to lab. Treatment: To be determined once lab results are received ROV prn if symptoms persist or worsen.

## 2023-12-21 ENCOUNTER — Other Ambulatory Visit: Payer: Self-pay | Admitting: Obstetrics & Gynecology

## 2023-12-21 ENCOUNTER — Encounter: Payer: Self-pay | Admitting: Obstetrics & Gynecology

## 2023-12-21 DIAGNOSIS — B9689 Other specified bacterial agents as the cause of diseases classified elsewhere: Secondary | ICD-10-CM

## 2023-12-21 LAB — CERVICOVAGINAL ANCILLARY ONLY
Bacterial Vaginitis (gardnerella): POSITIVE — AB
Candida Glabrata: NEGATIVE
Candida Vaginitis: NEGATIVE
Chlamydia: NEGATIVE
Comment: NEGATIVE
Comment: NEGATIVE
Comment: NEGATIVE
Comment: NEGATIVE
Comment: NEGATIVE
Comment: NORMAL
Neisseria Gonorrhea: NEGATIVE
Trichomonas: NEGATIVE

## 2023-12-21 MED ORDER — CLINDAMYCIN HCL 300 MG PO CAPS: 300.0000 mg | ORAL_CAPSULE | Freq: Three times a day (TID) | ORAL | 0 refills | Status: DC

## 2023-12-21 NOTE — Progress Notes (Signed)
 Clinda prescribed to treat BV. Mychart message sent.

## 2023-12-22 LAB — HIV ANTIBODY (ROUTINE TESTING W REFLEX): HIV Screen 4th Generation wRfx: NONREACTIVE

## 2023-12-22 LAB — HEPATITIS C ANTIBODY: Hep C Virus Ab: NONREACTIVE

## 2023-12-22 LAB — RPR: RPR Ser Ql: NONREACTIVE

## 2023-12-22 LAB — HEPATITIS B SURFACE ANTIGEN: Hepatitis B Surface Ag: NEGATIVE

## 2024-01-02 ENCOUNTER — Other Ambulatory Visit (HOSPITAL_COMMUNITY)
Admission: RE | Admit: 2024-01-02 | Discharge: 2024-01-02 | Disposition: A | Discharge status: Home / Self Care | Source: Ambulatory Visit | Attending: Obstetrics & Gynecology | Admitting: Obstetrics & Gynecology

## 2024-01-02 ENCOUNTER — Ambulatory Visit

## 2024-01-02 VITALS — BP 120/81 | Wt 200.0 lb

## 2024-01-02 DIAGNOSIS — N898 Other specified noninflammatory disorders of vagina: Secondary | ICD-10-CM | POA: Insufficient documentation

## 2024-01-02 DIAGNOSIS — N3000 Acute cystitis without hematuria: Secondary | ICD-10-CM

## 2024-01-02 LAB — POCT URINALYSIS DIPSTICK
Bilirubin, UA: NEGATIVE
Glucose, UA: NEGATIVE
Ketones, UA: NEGATIVE
Leukocytes, UA: NEGATIVE
Nitrite, UA: NEGATIVE
Protein, UA: NEGATIVE
Spec Grav, UA: 1.02
Urobilinogen, UA: 0.2 U/dL
pH, UA: 6

## 2024-01-02 NOTE — Progress Notes (Signed)
 SUBJECTIVE:  29 y.o. female complains of vaginal irritation which has seemed to get better after a few days, but pt would still like to do self swab to make sure. Denies abnormal vaginal bleeding or significant pelvic pain or fever. Denies history of known exposure to STD.  LMP: pt on cycle now  OBJECTIVE:  She appears well, afebrile.  ASSESSMENT:  Vaginal irritation  PLAN:  GC, chlamydia, trichomonas, BVAG, CVAG probe sent to lab. Treatment: To be determined once lab results are received ROV prn if symptoms persist or worsen.  SUBJECTIVE: Andrea Jenkins is a 28 y.o. female who complains of urinary frequency, urgency and dysuria x 3 days, without flank pain, fever, chills, or abnormal vaginal discharge or bleeding.   OBJECTIVE: Appears well, in no apparent distress.  Vital signs are normal. Urine dipstick shows negative for all components.    ASSESSMENT: Dysuria  PLAN: Treatment per orders.  Call or return to clinic prn if these symptoms worsen or fail to improve as anticipated.

## 2024-01-03 ENCOUNTER — Encounter: Payer: Self-pay | Admitting: Obstetrics & Gynecology

## 2024-01-03 LAB — CERVICOVAGINAL ANCILLARY ONLY
Bacterial Vaginitis (gardnerella): NEGATIVE
Candida Glabrata: NEGATIVE
Candida Vaginitis: NEGATIVE
Chlamydia: NEGATIVE
Comment: NEGATIVE
Comment: NEGATIVE
Comment: NEGATIVE
Comment: NEGATIVE
Comment: NEGATIVE
Comment: NORMAL
Neisseria Gonorrhea: NEGATIVE
Trichomonas: NEGATIVE

## 2024-01-04 LAB — URINE CULTURE

## 2024-01-05 ENCOUNTER — Encounter: Payer: Self-pay | Admitting: Obstetrics & Gynecology

## 2024-01-05 MED ORDER — CIPROFLOXACIN HCL 500 MG PO TABS: 500.0000 mg | ORAL_TABLET | Freq: Two times a day (BID) | ORAL | 0 refills | Status: DC

## 2024-01-05 NOTE — Addendum Note (Signed)
 Addended by: Jaynie Collins A on: 01/05/2024 05:17 PM   Modules accepted: Orders

## 2024-01-10 ENCOUNTER — Other Ambulatory Visit: Payer: Self-pay | Admitting: *Deleted

## 2024-01-10 DIAGNOSIS — N898 Other specified noninflammatory disorders of vagina: Secondary | ICD-10-CM

## 2024-01-10 MED ORDER — FLUCONAZOLE 150 MG PO TABS: 150.0000 mg | ORAL_TABLET | Freq: Once | ORAL | 0 refills | Status: AC

## 2024-01-10 NOTE — Progress Notes (Signed)
 TC from pt reporting signs and symptoms of vaginal yeast infection following taking antibiotics. RX Diflucan per protocol.

## 2024-01-18 DIAGNOSIS — F411 Generalized anxiety disorder: Secondary | ICD-10-CM | POA: Diagnosis not present

## 2024-01-26 DIAGNOSIS — F900 Attention-deficit hyperactivity disorder, predominantly inattentive type: Secondary | ICD-10-CM | POA: Diagnosis not present

## 2024-01-26 DIAGNOSIS — F411 Generalized anxiety disorder: Secondary | ICD-10-CM | POA: Diagnosis not present

## 2024-02-02 ENCOUNTER — Ambulatory Visit (INDEPENDENT_AMBULATORY_CARE_PROVIDER_SITE_OTHER)

## 2024-02-02 ENCOUNTER — Other Ambulatory Visit (HOSPITAL_COMMUNITY)
Admission: RE | Admit: 2024-02-02 | Discharge: 2024-02-02 | Disposition: A | Discharge status: Home / Self Care | Source: Ambulatory Visit | Attending: Obstetrics and Gynecology | Admitting: Obstetrics and Gynecology

## 2024-02-02 VITALS — BP 115/69 | HR 69 | Wt 209.9 lb

## 2024-02-02 DIAGNOSIS — N898 Other specified noninflammatory disorders of vagina: Secondary | ICD-10-CM

## 2024-02-02 NOTE — Progress Notes (Signed)
..  SUBJECTIVE:  28 y.o. female complains of vaginal discharge and odor for 1 week(s). Denies abnormal vaginal bleeding or significant pelvic pain or fever. No UTI symptoms. Denies history of known exposure to STD.  No LMP recorded.  OBJECTIVE:  She appears well, afebrile. Urine dipstick: not done.  ASSESSMENT:  Vaginal Discharge  Vaginal Odor   PLAN:  GC, chlamydia, trichomonas, BVAG, CVAG probe sent to lab. Treatment: To be determined once lab results are received ROV prn if symptoms persist or worsen.

## 2024-02-07 LAB — CERVICOVAGINAL ANCILLARY ONLY
Bacterial Vaginitis (gardnerella): POSITIVE — AB
Candida Glabrata: NEGATIVE
Candida Vaginitis: POSITIVE — AB
Chlamydia: NEGATIVE
Comment: NEGATIVE
Comment: NEGATIVE
Comment: NEGATIVE
Comment: NEGATIVE
Comment: NEGATIVE
Comment: NORMAL
Neisseria Gonorrhea: NEGATIVE
Trichomonas: NEGATIVE

## 2024-02-08 ENCOUNTER — Other Ambulatory Visit: Payer: Self-pay | Admitting: Obstetrics and Gynecology

## 2024-02-08 ENCOUNTER — Other Ambulatory Visit: Payer: Self-pay

## 2024-02-08 MED ORDER — METRONIDAZOLE 500 MG PO TABS: 500.0000 mg | ORAL_TABLET | Freq: Two times a day (BID) | ORAL | 0 refills | Status: DC

## 2024-02-08 MED ORDER — FLUCONAZOLE 150 MG PO TABS: 150.0000 mg | ORAL_TABLET | Freq: Once | ORAL | 0 refills | Status: AC

## 2024-02-09 ENCOUNTER — Telehealth: Payer: Self-pay

## 2024-02-09 MED ORDER — CLINDAMYCIN PHOSPHATE 2 % VA CREA: 1.0000 | TOPICAL_CREAM | Freq: Every day | VAGINAL | 0 refills | Status: DC

## 2024-02-09 MED ORDER — CLINDAMYCIN PHOSPHATE 2 % VA CREA: 1.0000 | TOPICAL_CREAM | Freq: Every day | VAGINAL | 0 refills | Status: AC

## 2024-02-09 NOTE — Telephone Encounter (Signed)
 S/w pt and advised new rx sent to pharmacy

## 2024-02-20 ENCOUNTER — Ambulatory Visit: Admission: EM | Admit: 2024-02-20 | Discharge: 2024-02-20 | Disposition: A | Discharge status: Home / Self Care

## 2024-02-20 ENCOUNTER — Ambulatory Visit: Admitting: Obstetrics & Gynecology

## 2024-02-20 ENCOUNTER — Other Ambulatory Visit: Payer: Self-pay

## 2024-02-20 VITALS — BP 108/73 | HR 96 | Wt 208.0 lb

## 2024-02-20 DIAGNOSIS — N760 Acute vaginitis: Secondary | ICD-10-CM

## 2024-02-20 DIAGNOSIS — Z538 Procedure and treatment not carried out for other reasons: Secondary | ICD-10-CM

## 2024-02-20 DIAGNOSIS — N898 Other specified noninflammatory disorders of vagina: Secondary | ICD-10-CM | POA: Diagnosis not present

## 2024-02-20 DIAGNOSIS — B9689 Other specified bacterial agents as the cause of diseases classified elsewhere: Secondary | ICD-10-CM

## 2024-02-20 MED ORDER — CLINDAMYCIN HCL 300 MG PO CAPS: 300.0000 mg | ORAL_CAPSULE | Freq: Three times a day (TID) | ORAL | 0 refills | Status: DC

## 2024-02-20 MED ORDER — FLUCONAZOLE 150 MG PO TABS: 150.0000 mg | ORAL_TABLET | Freq: Once | ORAL | 0 refills | Status: AC

## 2024-02-20 NOTE — Progress Notes (Signed)
 PA submitted for clinda

## 2024-02-20 NOTE — Progress Notes (Unsigned)
 Pt is in office to discuss frequent BV/yeast.  Pt has not yet been treated for recent +BV. Pt has allergy  to Flagyl .
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
## 2024-02-20 NOTE — ED Provider Notes (Signed)
 Patient is currently being treated for BV and yeast and will return in 8-9 days for retesting to ensure no STI.  Reports sexual intercourse unprotected however this writer advised patient that it is too soon to retest her and given that she is currently being treated for BV and yeast and has not even started treatment those would automatically result as positive therefore avoid unprotected sex complete treatment and return for retesting in 8 to 9 days.    Buena Carmine, NP 02/20/24 [phone]

## 2024-02-21 ENCOUNTER — Encounter: Payer: Self-pay | Admitting: Obstetrics & Gynecology

## 2024-02-22 ENCOUNTER — Ambulatory Visit

## 2024-02-24 ENCOUNTER — Ambulatory Visit: Admitting: Obstetrics & Gynecology

## 2024-02-29 ENCOUNTER — Encounter: Payer: Self-pay | Admitting: Emergency Medicine

## 2024-02-29 ENCOUNTER — Ambulatory Visit
Admission: EM | Admit: 2024-02-29 | Discharge: 2024-02-29 | Disposition: A | Discharge status: Home / Self Care | Attending: Family Medicine | Admitting: Family Medicine

## 2024-02-29 DIAGNOSIS — Z711 Person with feared health complaint in whom no diagnosis is made: Secondary | ICD-10-CM | POA: Insufficient documentation

## 2024-02-29 LAB — POCT URINE PREGNANCY: Preg Test, Ur: NEGATIVE
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
## 2024-02-29 NOTE — ED Triage Notes (Signed)
 Pt here for STI testing. Would like to complete cytology swab and blood work.

## 2024-02-29 NOTE — ED Notes (Signed)
 2 unsuccessful venipuncture attempts. Provider recommended pt come back later today after hydrating and avoiding caffeine . Pt never returned to urgent care. Labs discontinued by provider.

## 2024-02-29 NOTE — Discharge Instructions (Addendum)
 Lab results take up to 1-4 days, however can result sooner.   Our office will only call if results are abnormal and require treatment. If you see a result and have questions feel free to reach out via phone and call this office directly and request to speak with the nurse.  All results will be available to view on MyChart.

## 2024-03-01 LAB — CERVICOVAGINAL ANCILLARY ONLY
Bacterial Vaginitis (gardnerella): NEGATIVE
Candida Glabrata: NEGATIVE
Candida Vaginitis: NEGATIVE
Chlamydia: NEGATIVE
Comment: NEGATIVE
Comment: NEGATIVE
Comment: NEGATIVE
Comment: NEGATIVE
Comment: NEGATIVE
Comment: NORMAL
Neisseria Gonorrhea: NEGATIVE
Trichomonas: NEGATIVE

## 2024-03-14 ENCOUNTER — Encounter: Payer: Self-pay | Admitting: Obstetrics and Gynecology

## 2024-03-26 ENCOUNTER — Encounter: Payer: Self-pay | Admitting: Emergency Medicine

## 2024-03-26 ENCOUNTER — Ambulatory Visit
Admission: EM | Admit: 2024-03-26 | Discharge: 2024-03-26 | Disposition: A | Discharge status: Home / Self Care | Attending: Physician Assistant | Admitting: Physician Assistant

## 2024-03-26 DIAGNOSIS — Z113 Encounter for screening for infections with a predominantly sexual mode of transmission: Secondary | ICD-10-CM | POA: Insufficient documentation

## 2024-03-26 NOTE — ED Triage Notes (Signed)
 Pt here for STI testing. Would like to complete swab and blood work. No known exposures. Pt currently experiencing milky, white discharge and general discomfort x1 week.
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
## 2024-03-27 ENCOUNTER — Ambulatory Visit (HOSPITAL_COMMUNITY): Payer: Self-pay

## 2024-03-27 LAB — CERVICOVAGINAL ANCILLARY ONLY
Bacterial Vaginitis (gardnerella): NEGATIVE
Candida Glabrata: NEGATIVE
Candida Vaginitis: POSITIVE — AB
Chlamydia: NEGATIVE
Comment: NEGATIVE
Comment: NEGATIVE
Comment: NEGATIVE
Comment: NEGATIVE
Comment: NEGATIVE
Comment: NORMAL
Neisseria Gonorrhea: NEGATIVE
Trichomonas: NEGATIVE

## 2024-03-27 LAB — RPR: RPR Ser Ql: NONREACTIVE

## 2024-03-27 LAB — HIV ANTIBODY (ROUTINE TESTING W REFLEX): HIV Screen 4th Generation wRfx: NONREACTIVE

## 2024-03-27 MED ORDER — FLUCONAZOLE 150 MG PO TABS: 150.0000 mg | ORAL_TABLET | Freq: Once | ORAL | 0 refills | Status: AC

## 2024-04-19 ENCOUNTER — Ambulatory Visit
Admission: EM | Admit: 2024-04-19 | Discharge: 2024-04-19 | Disposition: A | Discharge status: Home / Self Care | Attending: Physician Assistant | Admitting: Physician Assistant

## 2024-04-19 ENCOUNTER — Encounter: Payer: Self-pay | Admitting: Emergency Medicine

## 2024-04-19 DIAGNOSIS — N898 Other specified noninflammatory disorders of vagina: Secondary | ICD-10-CM

## 2024-04-19 LAB — POCT URINALYSIS DIP (MANUAL ENTRY)
Bilirubin, UA: NEGATIVE
Blood, UA: NEGATIVE
Glucose, UA: NEGATIVE mg/dL
Ketones, POC UA: NEGATIVE mg/dL
Leukocytes, UA: NEGATIVE
Nitrite, UA: NEGATIVE
Protein Ur, POC: NEGATIVE mg/dL
Spec Grav, UA: 1.025 (ref 1.010–1.025)
Urobilinogen, UA: 0.2 U/dL
pH, UA: 6 (ref 5.0–8.0)

## 2024-04-19 LAB — CERVICOVAGINAL ANCILLARY ONLY
Candida Glabrata: NEGATIVE
Comment: NEGATIVE
Neisseria Gonorrhea: NEGATIVE
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
## 2024-04-19 NOTE — ED Triage Notes (Signed)
 Pt reports white, thick vaginal discharge x4-5 days as well as tingling/burning sensation with urination x2 days. Would like to complete cytology swab for STIs.

## 2024-04-20 LAB — CERVICOVAGINAL ANCILLARY ONLY
Bacterial Vaginitis (gardnerella): NEGATIVE
Candida Vaginitis: NEGATIVE
Chlamydia: NEGATIVE
Comment: NEGATIVE
Comment: NEGATIVE
Comment: NORMAL
Comment: NEGATIVE
Comment: NEGATIVE
Trichomonas: NEGATIVE

## 2024-05-01 ENCOUNTER — Ambulatory Visit

## 2024-05-01 ENCOUNTER — Other Ambulatory Visit (HOSPITAL_COMMUNITY)
Admission: RE | Admit: 2024-05-01 | Discharge: 2024-05-01 | Disposition: A | Discharge status: Home / Self Care | Source: Ambulatory Visit | Attending: Obstetrics & Gynecology | Admitting: Obstetrics & Gynecology

## 2024-05-01 VITALS — BP 107/74 | HR 79

## 2024-05-01 DIAGNOSIS — N898 Other specified noninflammatory disorders of vagina: Secondary | ICD-10-CM | POA: Diagnosis not present

## 2024-05-01 NOTE — Progress Notes (Signed)
 SUBJECTIVE:  28 y.o. female who desires a STI screen. C/O vaginal discharge with odor for ~ 3 days. Denies bleeding or significant pelvic pain. No UTI symptoms. Denies history of known exposure to STD.  Patient's last menstrual period was 04/22/24 (exact date).  OBJECTIVE:  She appears well.   ASSESSMENT:  STI Screen   PLAN:  Pt offered STI blood screening-declined GC, chlamydia, and trichomonas probe sent to lab.  Treatment: To be determined once lab results are received.  Pt follow up as needed.

## 2024-05-02 LAB — CERVICOVAGINAL ANCILLARY ONLY
Bacterial Vaginitis (gardnerella): NEGATIVE
Candida Glabrata: NEGATIVE
Candida Vaginitis: POSITIVE — AB
Chlamydia: NEGATIVE
Comment: NEGATIVE
Comment: NEGATIVE
Comment: NEGATIVE
Comment: NEGATIVE
Comment: NEGATIVE
Comment: NORMAL
Neisseria Gonorrhea: NEGATIVE
Trichomonas: NEGATIVE

## 2024-05-08 ENCOUNTER — Other Ambulatory Visit: Payer: Self-pay

## 2024-05-08 DIAGNOSIS — B379 Candidiasis, unspecified: Secondary | ICD-10-CM

## 2024-05-08 MED ORDER — FLUCONAZOLE 150 MG PO TABS: 150.0000 mg | ORAL_TABLET | Freq: Once | ORAL | 0 refills | Status: AC

## 2024-05-17 ENCOUNTER — Encounter: Payer: Self-pay | Admitting: Emergency Medicine

## 2024-05-17 ENCOUNTER — Ambulatory Visit
Admission: EM | Admit: 2024-05-17 | Discharge: 2024-05-17 | Disposition: A | Discharge status: Home / Self Care | Attending: Physician Assistant | Admitting: Physician Assistant

## 2024-05-17 DIAGNOSIS — N898 Other specified noninflammatory disorders of vagina: Secondary | ICD-10-CM | POA: Insufficient documentation
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
## 2024-05-17 NOTE — ED Triage Notes (Signed)
 Pt reports white vaginal discharge with itchiness and irritation x1 week. Notes fishy odor. Denies dysuria.

## 2024-05-18 ENCOUNTER — Ambulatory Visit (HOSPITAL_COMMUNITY): Payer: Self-pay

## 2024-05-18 LAB — CERVICOVAGINAL ANCILLARY ONLY
Bacterial Vaginitis (gardnerella): POSITIVE — AB
Candida Glabrata: NEGATIVE
Candida Vaginitis: NEGATIVE
Chlamydia: NEGATIVE
Comment: NEGATIVE
Comment: NEGATIVE
Comment: NEGATIVE
Comment: NEGATIVE
Comment: NEGATIVE
Comment: NORMAL
Neisseria Gonorrhea: NEGATIVE
Trichomonas: NEGATIVE

## 2024-05-18 MED ORDER — CLINDAMYCIN HCL 150 MG PO CAPS: 300.0000 mg | ORAL_CAPSULE | Freq: Two times a day (BID) | ORAL | 0 refills | Status: AC

## 2024-06-13 DIAGNOSIS — F411 Generalized anxiety disorder: Secondary | ICD-10-CM | POA: Diagnosis not present

## 2024-06-13 DIAGNOSIS — F900 Attention-deficit hyperactivity disorder, predominantly inattentive type: Secondary | ICD-10-CM | POA: Diagnosis not present

## 2024-06-27 ENCOUNTER — Ambulatory Visit
Admission: EM | Admit: 2024-06-27 | Discharge: 2024-06-27 | Disposition: A | Discharge status: Home / Self Care | Attending: Physician Assistant | Admitting: Physician Assistant

## 2024-06-27 ENCOUNTER — Encounter: Payer: Self-pay | Admitting: Emergency Medicine

## 2024-06-27 DIAGNOSIS — N898 Other specified noninflammatory disorders of vagina: Secondary | ICD-10-CM | POA: Insufficient documentation

## 2024-06-27 NOTE — ED Triage Notes (Signed)
 Pt c/o vaginal discharge onset last week with fishy smell
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
## 2024-06-28 ENCOUNTER — Ambulatory Visit

## 2024-06-28 LAB — CERVICOVAGINAL ANCILLARY ONLY
Bacterial Vaginitis (gardnerella): POSITIVE — AB
Chlamydia: NEGATIVE
Comment: NEGATIVE
Comment: NEGATIVE
Comment: NEGATIVE
Comment: NORMAL
Neisseria Gonorrhea: NEGATIVE
Trichomonas: NEGATIVE

## 2024-06-29 ENCOUNTER — Ambulatory Visit (HOSPITAL_COMMUNITY): Payer: Self-pay

## 2024-06-29 MED ORDER — CLINDAMYCIN HCL 150 MG PO CAPS
300.0000 mg | ORAL_CAPSULE | Freq: Two times a day (BID) | ORAL | 0 refills | Status: AC
Start: 2024-06-29 — End: 2024-07-06

## 2024-07-06 DIAGNOSIS — E6609 Other obesity due to excess calories: Secondary | ICD-10-CM | POA: Diagnosis not present

## 2024-07-06 DIAGNOSIS — Z0000 Encounter for general adult medical examination without abnormal findings: Secondary | ICD-10-CM | POA: Diagnosis not present

## 2024-07-06 DIAGNOSIS — R0602 Shortness of breath: Secondary | ICD-10-CM | POA: Diagnosis not present

## 2024-07-06 DIAGNOSIS — N898 Other specified noninflammatory disorders of vagina: Secondary | ICD-10-CM | POA: Diagnosis not present

## 2024-07-06 DIAGNOSIS — Z6836 Body mass index (BMI) 36.0-36.9, adult: Secondary | ICD-10-CM | POA: Diagnosis not present

## 2024-07-06 DIAGNOSIS — E559 Vitamin D deficiency, unspecified: Secondary | ICD-10-CM | POA: Diagnosis not present

## 2024-07-06 DIAGNOSIS — Z1159 Encounter for screening for other viral diseases: Secondary | ICD-10-CM | POA: Diagnosis not present

## 2024-07-06 DIAGNOSIS — R5383 Other fatigue: Secondary | ICD-10-CM | POA: Diagnosis not present

## 2024-07-07 ENCOUNTER — Other Ambulatory Visit: Payer: Self-pay

## 2024-07-07 ENCOUNTER — Ambulatory Visit (HOSPITAL_COMMUNITY)
Admission: EM | Admit: 2024-07-07 | Discharge: 2024-07-07 | Disposition: A | Discharge status: Other Acute Inpatient Hospital | Attending: Family Medicine | Admitting: Family Medicine

## 2024-07-07 ENCOUNTER — Inpatient Hospital Stay
Admission: AD | Admit: 2024-07-07 | Discharge: 2024-07-11 | DRG: 885 | Disposition: A | Discharge status: Home / Self Care | Source: Intra-hospital | Attending: Family Medicine | Admitting: Family Medicine

## 2024-07-07 ENCOUNTER — Encounter: Payer: Self-pay | Admitting: Psychiatry

## 2024-07-07 DIAGNOSIS — Z821 Family history of blindness and visual loss: Secondary | ICD-10-CM

## 2024-07-07 DIAGNOSIS — F1090 Alcohol use, unspecified, uncomplicated: Secondary | ICD-10-CM | POA: Insufficient documentation

## 2024-07-07 DIAGNOSIS — Z825 Family history of asthma and other chronic lower respiratory diseases: Secondary | ICD-10-CM | POA: Diagnosis not present

## 2024-07-07 DIAGNOSIS — F314 Bipolar disorder, current episode depressed, severe, without psychotic features: Secondary | ICD-10-CM | POA: Diagnosis not present

## 2024-07-07 DIAGNOSIS — F419 Anxiety disorder, unspecified: Secondary | ICD-10-CM | POA: Diagnosis present

## 2024-07-07 DIAGNOSIS — Z9049 Acquired absence of other specified parts of digestive tract: Secondary | ICD-10-CM | POA: Diagnosis not present

## 2024-07-07 DIAGNOSIS — Z79899 Other long term (current) drug therapy: Secondary | ICD-10-CM | POA: Diagnosis not present

## 2024-07-07 DIAGNOSIS — G4700 Insomnia, unspecified: Secondary | ICD-10-CM | POA: Diagnosis not present

## 2024-07-07 DIAGNOSIS — Z5986 Financial insecurity: Secondary | ICD-10-CM

## 2024-07-07 DIAGNOSIS — Z8249 Family history of ischemic heart disease and other diseases of the circulatory system: Secondary | ICD-10-CM

## 2024-07-07 DIAGNOSIS — J45909 Unspecified asthma, uncomplicated: Secondary | ICD-10-CM | POA: Diagnosis not present

## 2024-07-07 DIAGNOSIS — F411 Generalized anxiety disorder: Secondary | ICD-10-CM | POA: Diagnosis not present

## 2024-07-07 DIAGNOSIS — F17290 Nicotine dependence, other tobacco product, uncomplicated: Secondary | ICD-10-CM | POA: Diagnosis present

## 2024-07-07 DIAGNOSIS — R45851 Suicidal ideations: Secondary | ICD-10-CM | POA: Insufficient documentation

## 2024-07-07 DIAGNOSIS — Z560 Unemployment, unspecified: Secondary | ICD-10-CM | POA: Insufficient documentation

## 2024-07-07 DIAGNOSIS — R9431 Abnormal electrocardiogram [ECG] [EKG]: Secondary | ICD-10-CM | POA: Diagnosis not present

## 2024-07-07 DIAGNOSIS — R45850 Homicidal ideations: Secondary | ICD-10-CM | POA: Diagnosis not present

## 2024-07-07 DIAGNOSIS — Z91018 Allergy to other foods: Secondary | ICD-10-CM

## 2024-07-07 DIAGNOSIS — Z818 Family history of other mental and behavioral disorders: Secondary | ICD-10-CM

## 2024-07-07 DIAGNOSIS — F909 Attention-deficit hyperactivity disorder, unspecified type: Secondary | ICD-10-CM | POA: Diagnosis not present

## 2024-07-07 DIAGNOSIS — Z833 Family history of diabetes mellitus: Secondary | ICD-10-CM

## 2024-07-07 DIAGNOSIS — Z881 Allergy status to other antibiotic agents status: Secondary | ICD-10-CM

## 2024-07-07 LAB — LIPID PANEL
Cholesterol: 192 mg/dL (ref 0–200)
HDL: 74 mg/dL (ref >40)
LDL Cholesterol: 102 mg/dL — ABNORMAL HIGH (ref 0–99)
Total CHOL/HDL Ratio: 2.6 ratio
Triglycerides: 80 mg/dL (ref <150)
VLDL: 16 mg/dL (ref 0–40)

## 2024-07-07 LAB — CBC WITH DIFFERENTIAL/PLATELET
Abs Immature Granulocytes: 0.02 K/uL (ref 0.00–0.07)
Basophils Absolute: 0 K/uL (ref 0.0–0.1)
Basophils Relative: 1 %
Eosinophils Absolute: 0.1 K/uL (ref 0.0–0.5)
Eosinophils Relative: 3 %
HCT: 41.9 % (ref 36.0–46.0)
Hemoglobin: 13.5 g/dL (ref 12.0–15.0)
Immature Granulocytes: 1 %
Lymphocytes Relative: 54 %
Lymphs Abs: 2.4 K/uL (ref 0.7–4.0)
MCH: 27.5 pg (ref 26.0–34.0)
MCHC: 32.2 g/dL (ref 30.0–36.0)
MCV: 85.3 fL (ref 80.0–100.0)
Monocytes Absolute: 0.4 K/uL (ref 0.1–1.0)
Monocytes Relative: 8 %
Neutro Abs: 1.4 K/uL — ABNORMAL LOW (ref 1.7–7.7)
Neutrophils Relative %: 33 %
Platelets: 224 K/uL (ref 150–400)
RBC: 4.91 MIL/uL (ref 3.87–5.11)
RDW: 12.8 % (ref 11.5–15.5)
WBC: 4.4 K/uL (ref 4.0–10.5)
nRBC: 0 % (ref 0.0–0.2)

## 2024-07-07 LAB — POCT URINE DRUG SCREEN - MANUAL ENTRY (I-SCREEN)
POC Amphetamine UR: NOT DETECTED
POC Buprenorphine (BUP): NOT DETECTED
POC Cocaine UR: NOT DETECTED
POC Marijuana UR: NOT DETECTED
POC Methadone UR: NOT DETECTED
POC Methamphetamine UR: NOT DETECTED
POC Morphine: NOT DETECTED
POC Oxazepam (BZO): NOT DETECTED
POC Oxycodone UR: NOT DETECTED
POC Secobarbital (BAR): NOT DETECTED

## 2024-07-07 LAB — COMPREHENSIVE METABOLIC PANEL WITH GFR
ALT: 35 U/L (ref 0–44)
AST: 30 U/L (ref 15–41)
Albumin: 4.4 g/dL (ref 3.5–5.0)
Alkaline Phosphatase: 49 U/L (ref 38–126)
Anion gap: 10 (ref 5–15)
BUN: 10 mg/dL (ref 6–20)
CO2: 24 mmol/L (ref 22–32)
Calcium: 9.4 mg/dL (ref 8.9–10.3)
Chloride: 98 mmol/L (ref 98–111)
Creatinine, Ser: 0.71 mg/dL (ref 0.44–1.00)
GFR, Estimated: >60 mL/min (ref >60)
Glucose, Bld: 74 mg/dL (ref 70–99)
Potassium: 4.5 mmol/L (ref 3.5–5.1)
Sodium: 132 mmol/L — ABNORMAL LOW (ref 135–145)
Total Bilirubin: 0.6 mg/dL (ref 0.0–1.2)
Total Protein: 7.7 g/dL (ref 6.5–8.1)

## 2024-07-07 LAB — URINALYSIS, ROUTINE W REFLEX MICROSCOPIC
Bilirubin Urine: NEGATIVE
Glucose, UA: NEGATIVE mg/dL
Hgb urine dipstick: NEGATIVE
Ketones, ur: NEGATIVE mg/dL
Leukocytes,Ua: NEGATIVE
Nitrite: NEGATIVE
Protein, ur: NEGATIVE mg/dL
Specific Gravity, Urine: 1.02 (ref 1.005–1.030)
pH: 6 (ref 5.0–8.0)

## 2024-07-07 LAB — ETHANOL: Alcohol, Ethyl (B): <15 mg/dL (ref <15)

## 2024-07-07 LAB — HEMOGLOBIN A1C
Hgb A1c MFr Bld: 4.8 % (ref 4.8–5.6)
Mean Plasma Glucose: 91.06 mg/dL

## 2024-07-07 LAB — TSH: TSH: 1.443 u[IU]/mL (ref 0.350–4.500)

## 2024-07-07 LAB — POC URINE PREG, ED: Preg Test, Ur: NEGATIVE

## 2024-07-07 MED ORDER — ARIPIPRAZOLE 5 MG PO TABS: 5.0000 mg | ORAL_TABLET | Freq: Every day | ORAL | Status: DC

## 2024-07-07 MED ORDER — HALOPERIDOL LACTATE 5 MG/ML IJ SOLN: 5.0000 mg | Freq: Three times a day (TID) | INTRAMUSCULAR | Status: DC | PRN

## 2024-07-07 MED ORDER — ALUM & MAG HYDROXIDE-SIMETH 200-200-20 MG/5ML PO SUSP
30.0000 mL | ORAL | Status: DC | PRN
  Administered 2024-07-10: 30 mL via ORAL
  Filled 2024-07-07: qty 30

## 2024-07-07 MED ORDER — HALOPERIDOL 5 MG PO TABS: 5.0000 mg | ORAL_TABLET | Freq: Three times a day (TID) | ORAL | Status: DC | PRN

## 2024-07-07 MED ORDER — ARIPIPRAZOLE 5 MG PO TABS
5.0000 mg | ORAL_TABLET | Freq: Every day | ORAL | Status: DC
Start: 2024-07-08 — End: 2024-07-18
  Administered 2024-07-08: 5 mg via ORAL
  Filled 2024-07-07: qty 1

## 2024-07-07 MED ORDER — ARIPIPRAZOLE 10 MG PO TABS
10.0000 mg | ORAL_TABLET | Freq: Every day | ORAL | Status: DC
Start: 2024-07-08 — End: 2024-07-07

## 2024-07-07 MED ORDER — ARIPIPRAZOLE 10 MG PO TABS: 10.0000 mg | ORAL_TABLET | Freq: Every day | ORAL | Status: DC

## 2024-07-07 MED ORDER — HALOPERIDOL LACTATE 5 MG/ML IJ SOLN: 10.0000 mg | Freq: Three times a day (TID) | INTRAMUSCULAR | Status: DC | PRN

## 2024-07-07 MED ORDER — MAGNESIUM HYDROXIDE 400 MG/5ML PO SUSP: 30.0000 mL | Freq: Every day | ORAL | Status: DC | PRN

## 2024-07-07 MED ORDER — HYDROXYZINE HCL 25 MG PO TABS: 25.0000 mg | ORAL_TABLET | Freq: Three times a day (TID) | ORAL | Status: DC | PRN

## 2024-07-07 MED ORDER — ATOMOXETINE HCL 40 MG PO CAPS
60.0000 mg | ORAL_CAPSULE | Freq: Every day | ORAL | Status: DC
Start: 2024-07-08 — End: 2024-07-18
  Filled 2024-07-07: qty 2

## 2024-07-07 MED ORDER — LORAZEPAM 2 MG/ML IJ SOLN: 2.0000 mg | Freq: Three times a day (TID) | INTRAMUSCULAR | Status: DC | PRN

## 2024-07-07 MED ORDER — ACETAMINOPHEN 325 MG PO TABS: 650.0000 mg | ORAL_TABLET | Freq: Four times a day (QID) | ORAL | Status: DC | PRN

## 2024-07-07 MED ORDER — DIPHENHYDRAMINE HCL 50 MG/ML IJ SOLN: 50.0000 mg | Freq: Three times a day (TID) | INTRAMUSCULAR | Status: DC | PRN

## 2024-07-07 MED ORDER — ARIPIPRAZOLE 10 MG PO TABS
10.0000 mg | ORAL_TABLET | Freq: Once | ORAL | Status: AC
  Administered 2024-07-07: 10 mg via ORAL
  Filled 2024-07-07: qty 1

## 2024-07-07 MED ORDER — DIPHENHYDRAMINE HCL 25 MG PO CAPS: 50.0000 mg | ORAL_CAPSULE | Freq: Three times a day (TID) | ORAL | Status: DC | PRN

## 2024-07-07 MED ORDER — DIPHENHYDRAMINE HCL 50 MG PO CAPS: 50.0000 mg | ORAL_CAPSULE | Freq: Three times a day (TID) | ORAL | Status: DC | PRN

## 2024-07-07 MED ORDER — ALUM & MAG HYDROXIDE-SIMETH 200-200-20 MG/5ML PO SUSP: 30.0000 mL | ORAL | Status: DC | PRN

## 2024-07-07 MED ORDER — TRAZODONE HCL 50 MG PO TABS: 50.0000 mg | ORAL_TABLET | Freq: Every day | ORAL | Status: DC

## 2024-07-07 MED ORDER — TRAZODONE HCL 50 MG PO TABS
50.0000 mg | ORAL_TABLET | Freq: Every day | ORAL | Status: DC
  Administered 2024-07-08 – 2024-07-10 (×3): 50 mg via ORAL
  Filled 2024-07-07 (×3): qty 1

## 2024-07-07 MED ORDER — ATOMOXETINE HCL 60 MG PO CAPS
60.0000 mg | ORAL_CAPSULE | Freq: Every day | ORAL | Status: DC
Start: 2024-07-08 — End: 2024-07-07

## 2024-07-07 NOTE — ED Provider Notes (Signed)
 Behavioral Health Urgent Care Medical Screening Exam  Patient Name: Andrea Jenkins MRN: [id number] Date of Evaluation: 07/07/24 Chief Complaint:  I was waiting for kids to leave Diagnosis:  Final diagnoses:  Severe bipolar I disorder, current or most recent episode depressed (HCC)  Suicidal ideation    History of Present illness: Andrea Jenkins is a 28 y.o. female, Bipolar Disorder 1, GAD, ADHD, patient  presented to [facility] as a walk in voluntarily accompanied by mother (not present during interview with patient) reporting suicidal ideations with a plan to overdose on pills.   Andrea Jenkins, 28 y.o., female patient seen face to face by this provider, consulted with Dr. Cole; and chart reviewed on 07/07/24.    On evaluation Andrea Jenkins reports, several months of worsening depression and thoughts of suicide however she only developed a plan and intent to commit suicide within the last week.  Patient reports she is waiting for her children to go away for the weekend before overdosing on medication to end her life.  She reports upon awakening this morning she realized that she only had two choices seek help or go through with plan to end life.  Per chart review patient has a history significant for bipolar disorder and has been seen previously in the outpatient clinic here at [facility] and was followed by previous psychiatrist by the name of Dr. Mercy.  Patient reports the last psychiatric medication she was prescribed was Zoloft  and Abilify .  She is unable to recall the doses of each medication but recalls that the last psychiatric provider she saw wanted to transition her to the Abilify  Maintana as she did struggle with taking her medications consistently.  Patient was last prescribed psychiatric medicines in October 2024.  Patient has a long history of difficulty with sleep and a diagnosed history of insomnia patient endorses that she continues to struggle with sleep and rarely  sleeps throughout the night.  Patient is unable to quantify the number of hours of sleep she is able to achieve within 24 hours.  Patient denies any history of previous psychiatric admissions.  Denies any suicidal behavior.  Patient is very guarded during the interview today as she did disclose to TTS that she has a active order for rest however did not disclose the details surrounding this.  Patient endorses in addition to suicidal ideations homicidal ideations in general it is not directed toward any specific person.  She currently lives with her mom.  She is unemployed and currently not attending school.  She reports that she has her children during the week and on the weekends they are with other family.  She endorses that she is a weekend drinker and has recently consistently drank from Friday until Sunday alcoholic beverages including liquor.  Denies any use of any other substances.  Endorses that she vapes she reports flavored vaping solution but is uncertain of whether or not it contains any cannabis or nicotine .  Patient denies any chronic medical conditions.  Patient feels that she is unable to maintain safety if she were to be discharged and would like to get back started on her psychiatric medications to help stabilize her mental health.  During evaluation Andrea Jenkins is sitting upright, in no acute distress.  She is alert/oriented x 4; depressed, dysphoric, with blunted/congruent affect. He is speaking in a clear tone at moderate volume, and slowed pace; with fair eye contact. Her thought process is coherent, but she is guarded and restrictive  with disclosing details of current mental health crisis. There is no indication that she is currently responding to internal/external stimuli or experiencing delusional thought content. Patient appears paranoid when this writer attempts to obtain details pertaining to current problem.  Patient is actively suicidal with plan to overdose. Endorses passive  homicidal ideations without providing details specific towards anyone she desires to harm.  Patient has remained calm throughout assessment and has answered questions appropriately.  Patient needs inpatient psychiatric treatment criteria and has been accepted tentatively to [facility] BMU pending labs resulting.  Flowsheet Row ED from 07/07/2024 in [facility] UC from 06/27/2024 in [facility] at [facility] UC from 05/17/2024 in [facility] Urgent Care at [facility])  C-SSRS RISK CATEGORY High Risk No Risk No Risk    Psychiatric Specialty Exam  Presentation  General Appearance:Fairly Groomed  Eye Contact:Fair  Speech:Clear and Coherent  Speech Volume:Normal  Handedness:No data recorded  Mood and Affect  Mood:Depressed; Dysphoric  Affect:Blunt   Thought Process  Thought Processes:Coherent  Descriptions of Associations:Intact  Orientation:Full (Time, Place and Person)  Thought Content:Scattered (Guarded)  Diagnosis of Schizophrenia or Schizoaffective disorder in past: No  Duration of Psychotic Symptoms: No data recorded Hallucinations:None  Ideas of Reference:Paranoia  Suicidal Thoughts:Yes, Active With Plan; With Intent  Homicidal Thoughts:Yes, Passive (did not disclose details)   Sensorium  Memory:Immediate Good; Recent Good; Remote Good  Judgment:Poor  Insight:Fair   Executive Functions  Concentration:Fair  Attention Span:Fair  Recall:Fair  Fund of Knowledge:Fair  Language:Fair   Psychomotor Activity  Psychomotor Activity:Normal   Assets  Assets:Communication Skills; Desire for Improvement; Financial Resources/Insurance; Housing; Social Support   Sleep  Sleep:Poor  Number of hours: -- (up all night most nights)   Physical Exam: Physical Exam Constitutional:      General: She is not in acute distress.    Appearance: Normal appearance. She is not ill-appearing.  HENT:      Head: Normocephalic and atraumatic.     Nose: Nose normal.  Eyes:     Extraocular Movements: Extraocular movements intact.     Conjunctiva/sclera: Conjunctivae normal.     Pupils: Pupils are equal, round, and reactive to light.  Cardiovascular:     Rate and Rhythm: Normal rate and regular rhythm.  Pulmonary:     Effort: Pulmonary effort is normal.     Breath sounds: Normal breath sounds.  Musculoskeletal:     Cervical back: Normal range of motion and neck supple.  Skin:    General: Skin is warm and dry.     Comments: Tattoos bilateral upper extremities   Neurological:     General: No focal deficit present.     Mental Status: She is alert and oriented to person, place, and time.     Review of Systems  Psychiatric/Behavioral:  Positive for depression, substance abuse (Alcohol use) and suicidal ideas. The patient has insomnia.      Blood pressure 109/70, pulse 70, temperature 98.6 F (37 C), temperature source Oral, resp. rate 20, last menstrual period 06/20/2024, SpO2 99%, not currently breastfeeding. There is no height or weight on file to calculate BMI.  Musculoskeletal: Strength & Muscle Tone: within normal limits Gait & Station: normal Patient leans: N/A   [facility] MSE Discharge Disposition for Follow up and Recommendations: Based on my evaluation I certify that psychiatric inpatient services furnished can reasonably be expected to improve the patient's condition which I recommend transfer to an appropriate accepting facility.   Severe  bipolar I disorder, current or most recent episode depressed (HCC)  Suicidal ideation -Restart Abilify  5 mg AM and 10 mg at bedtime, combine to nighttime dose as patient stabilizes -Continue Atomoxetine  60 mg daily  -Continue Trazodone  50 mg at bedtime   Agitation protocol ordered See MAR  Labs Ordered This Encounter TSH, due to worsening depression and rule out hypothyroidism Lipid, standard lab ordered for all patients received an  antipsychotic therapy Ethanol, standard order for all patient reporting alcohol misuse or overuse, or to rule out intoxication in patient presenting with psychiatric complaint or behavioral changes  Hemoglobin A1c, standard lab ordered for all patients received an antipsychotic therapy CBC, stander to rule out any infection or anemia CMP, rule out metabolic conditions which may be contributing to mental health symptoms UDS, standard order, rule out substance induced mood vs altered mental status related to substance use     Andrea Lesches, NP 07/07/2024, 3:20 PM

## 2024-07-07 NOTE — Discharge Instructions (Addendum)
 [facility] BMU Unit

## 2024-07-07 NOTE — Progress Notes (Signed)
   07/07/24 1326  [facility] Triage Screening (Walk-ins at [facility] only)  How Did You Hear About Us ? Family/Friend  What Is the Reason for Your Visit/Call Today? Pt presents to [facility] voluntarily and accompanied with her mother, Deserie Dirks with complaints of hoplessness and anxious feelings.  Pt reports SI with a plan to overdose on medication. Pt reports drinking a 5th of Tequila last night, I started drinking at 2:30p on Friday and ends on Sunday.  Pt deneis HI or AVH.  Pt reports pending charge OFR, order for arrest.  Pt reports prior MH diagnosis or prescribed medication for symptom management.  How Long Has This Been Causing You Problems? 1 wk - 1 month  Have You Recently Had Any Thoughts About Hurting Yourself? Yes  How long ago did you have thoughts about hurting yourself? 24 horus  Are You Planning to Commit Suicide/Harm Yourself At This time? Yes  Have you Recently Had Thoughts About Hurting Someone Sherral? No  Are You Planning To Harm Someone At This Time? No  Physical Abuse Denies  Verbal Abuse Denies  Sexual Abuse Denies  Exploitation of patient/patient's resources Denies  Self-Neglect Denies  Possible abuse reported to: Other (Comment) (n/a)  Are you currently experiencing any auditory, visual or other hallucinations? No  Have You Used Any Alcohol or Drugs in the Past 24 Hours? Yes  What Did You Use and How Much? Tequila, 5th  Do you have any current medical co-morbidities that require immediate attention? No  Clinician description of patient physical appearance/behavior: anxious  What Do You Feel Would Help You the Most Today? Alcohol or Drug Use Treatment;Treatment for Depression or other mood problem  If access to [facility] Urgent Care was not available, would you have sought care in the Emergency Department? Yes  Determination of Need Urgent (48 hours)  Options For Referral Chemical Dependency Intensive Outpatient Therapy (CDIOP);Facility-Based Crisis  Determination of Need filed? Yes
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
## 2024-07-07 NOTE — Progress Notes (Signed)
   07/07/24 2130  Psych Admission Type (Psych Patients Only)  Admission Status Voluntary  Psychosocial Assessment  Patient Complaints Anxiety;Depression  Eye Contact Brief  Facial Expression Anxious  Affect Flat  Speech Slow;Soft  Interaction Guarded  Motor Activity Slow  Appearance/Hygiene In scrubs  Behavior Characteristics Cooperative;Anxious  Mood Depressed;Anxious  Thought Process  Coherency Circumstantial  Content Blaming others  Delusions Persecutory  Perception Hallucinations  Hallucination Auditory  Judgment Impaired  Confusion Mild  Danger to Self  Current suicidal ideation? Active;Verbalizes  Description of Suicide Plan Overdose on medicaition  Self-Injurious Behavior Self-injurious ideation with potentially lethal plan observed or expressed  Agreement Not to Harm Self Yes  Description of Agreement Verbal Agreement  Danger to Others  Danger to Others None reported or observed   Patient refused to answer most of the questions on the admission. She verbalized that she still want to kill herself and that she hear voices. The voices has not been telling her what to do it is just she is hearing them.
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
## 2024-07-08 DIAGNOSIS — F314 Bipolar disorder, current episode depressed, severe, without psychotic features: Secondary | ICD-10-CM

## 2024-07-08 MED ORDER — LURASIDONE HCL 40 MG PO TABS: 40.0000 mg | ORAL_TABLET | Freq: Every day | ORAL | Status: DC

## 2024-07-08 MED ORDER — ATOMOXETINE HCL 40 MG PO CAPS
40.0000 mg | ORAL_CAPSULE | Freq: Every day | ORAL | Status: DC
  Administered 2024-07-08 – 2024-07-11 (×4): 40 mg via ORAL
  Filled 2024-07-08 (×4): qty 1

## 2024-07-08 MED ORDER — NICOTINE 21 MG/24HR TD PT24
21.0000 mg | MEDICATED_PATCH | Freq: Every day | TRANSDERMAL | Status: DC
  Administered 2024-07-08 – 2024-07-11 (×4): 21 mg via TRANSDERMAL
  Filled 2024-07-08 (×3): qty 1

## 2024-07-08 MED ORDER — LORATADINE 10 MG PO TABS
10.0000 mg | ORAL_TABLET | Freq: Every day | ORAL | Status: DC
  Administered 2024-07-08 – 2024-07-11 (×4): 10 mg via ORAL
  Filled 2024-07-08 (×4): qty 1

## 2024-07-08 MED ORDER — LURASIDONE HCL 40 MG PO TABS
20.0000 mg | ORAL_TABLET | Freq: Every day | ORAL | Status: AC
  Administered 2024-07-09 – 2024-07-11 (×3): 20 mg via ORAL
  Filled 2024-07-08 (×3): qty 1

## 2024-07-08 NOTE — BHH Suicide Risk Assessment (Signed)
 [facility] Admission Suicide Risk Assessment   Nursing information obtained from:    Demographic factors:  Unemployed Current Mental Status:  Suicidal ideation indicated by patient Loss Factors:  Financial problems / change in socioeconomic status Historical Factors:  NA Risk Reduction Factors:  Positive social support  Total Time spent with patient: 1 hour Principal Problem: Bipolar 1 disorder, depressed, severe (HCC) Diagnosis:  Principal Problem:   Bipolar 1 disorder, depressed, severe (HCC)  Subjective Data: [name] with a history of bipolar disorder, depression, anxiety, ADHD, and insomnia, presenting with severe depressive symptoms, active suicidal ideation with plan to overdose, and passive homicidal ideation without specific target. She demonstrates multiple risk factors including recent worsening depression, poor sleep, alcohol use, medication non-adherence, guarded affect, and concrete suicidal planning. Protective factors include her expressed motivation to re-engage in treatment, her relationship with her children, and support from her mother and siblings.  Her presentation is consistent with Bipolar I Disorder, current episode depressed, severe, with suicidal ideation, and she also meets criteria for Alcohol Use Disorder and ADHD, by history. She does not exhibit psychotic features at this time. She remains at high acute suicide risk given her recent plan and intent, inability to maintain safety outside of a controlled environment, and lack of outpatient treatment engagement.  In discussion of treatment options, it was noted that the patient has had poor response to Abilify  and Zoloft  in the past. Seroquel was discussed as an alternative; however, the patient was not agreeable due to concerns about weight gain. Risks and benefits of Latuda  were reviewed, and the patient provided informed consent to initiate a trial of Latuda  for mood stabilization and management of her  depressive symptoms.  Given her current symptom profile, safety concerns, and willingness to restart treatment, inpatient admission is warranted for crisis stabilization, initiation of pharmacologic management, engagement in therapeutic programming, and coordination of aftercare planning.  Continued Clinical Symptoms:  Alcohol Use Disorder Identification Test Final Score (AUDIT): 8 The Alcohol Use Disorders Identification Test, Guidelines for Use in Primary Care, Second Edition.  World Science writer [facility]). Score between 0-7:  no or low risk or alcohol related problems. Score between 8-15:  moderate risk of alcohol related problems. Score between 16-19:  high risk of alcohol related problems. Score 20 or above:  warrants further diagnostic evaluation for alcohol dependence and treatment.   CLINICAL FACTORS:   Bipolar Disorder:   Depressive phase Depression:   Hopelessness Unstable or Poor Therapeutic Relationship   Musculoskeletal: Strength & Muscle Tone: within normal limits Gait & Station: normal Patient leans: N/A  Psychiatric Specialty Exam:  Presentation  General Appearance:  Disheveled  Eye Contact: Absent  Speech: Clear and Coherent  Speech Volume: Normal  Handedness:No data recorded  Mood and Affect  Mood: Depressed  Affect: Congruent   Thought Process  Thought Processes: Coherent  Descriptions of Associations:Intact  Orientation:Full (Time, Place and Person)  Thought Content:Logical  History of Schizophrenia/Schizoaffective disorder:No  Duration of Psychotic Symptoms:No data recorded Hallucinations:Hallucinations: None  Ideas of Reference:None  Suicidal Thoughts:Suicidal Thoughts: Yes, Active SI Active Intent and/or Plan: With Plan; With Intent  Homicidal Thoughts:Homicidal Thoughts: No   Sensorium  Memory: Immediate Fair  Judgment: Poor  Insight: Poor   Executive Functions  Concentration: Fair  Attention  Span: Fair  Recall: [employer] of Knowledge: Fair  Language: Fair   Psychomotor Activity  Psychomotor Activity: Psychomotor Activity: Normal   Assets  Assets: Housing; Social Support   Sleep  Sleep: Sleep:  Poor Number of Hours of Sleep: -- (up all night most nights)    Physical Exam: Physical Exam ROS Blood pressure 106/66, pulse (!) 58, temperature 98.3 F (36.8 C), temperature source Oral, resp. rate 20, height 5' 4 (1.626 m), weight 96.2 kg, last menstrual period 06/20/2024, SpO2 100%, not currently breastfeeding. Body mass index is 36.39 kg/m.   COGNITIVE FEATURES THAT CONTRIBUTE TO RISK:  None    SUICIDE RISK:   Severe:  Frequent, intense, and enduring suicidal ideation, specific plan, no subjective intent, but some objective markers of intent (i.e., choice of lethal method), the method is accessible, some limited preparatory behavior, evidence of impaired self-control, severe dysphoria/symptomatology, multiple risk factors present, and few if any protective factors, particularly a lack of social support.  PLAN OF CARE: 1.    Safety and Monitoring:   -- admission to inpatient psychiatric unit for safety, stabilization and treatment -- Daily contact with patient to assess and evaluate symptoms and progress in treatment -- Patient's case to be discussed in multi-disciplinary team meeting -- Observation Level : q15 minute checks -- Vital signs:  q12 hours -- Precautions: suicide   I certify that inpatient services furnished can reasonably be expected to improve the patient's condition.   Donnice FORBES Right, PA-C 07/08/2024, 2:54 PM

## 2024-07-08 NOTE — Plan of Care (Signed)
  Problem: Education: Goal: Verbalization of understanding the information provided will improve Outcome: Progressing   Problem: Coping: Goal: Ability to verbalize frustrations and anger appropriately will improve Outcome: Progressing Goal: Ability to demonstrate self-control will improve Outcome: Progressing   Problem: Health Behavior/Discharge Planning: Goal: Compliance with treatment plan for underlying cause of condition will improve Outcome: Progressing   Problem: Education: Goal: Emotional status will improve Outcome: Not Progressing Goal: Mental status will improve Outcome: Not Progressing   Problem: Activity: Goal: Interest or engagement in activities will improve Outcome: Not Progressing Goal: Sleeping patterns will improve Outcome: Not Progressing

## 2024-07-08 NOTE — Group Note (Signed)
 Date:  07/08/2024 Time:  10:09 PM  Group Topic/Focus:  Goals Group:   The focus of this group is to help patients establish daily goals to achieve during treatment and discuss how the patient can incorporate goal setting into their daily lives to aide in recovery. Making Healthy Choices:   The focus of this group is to help patients identify negative/unhealthy choices they were using prior to admission and identify positive/healthier coping strategies to replace them upon discharge. Self Care:   The focus of this group is to help patients understand the importance of self-care in order to improve or restore emotional, physical, spiritual, interpersonal, and financial health.    Participation Level:  Active  Participation Quality:  Appropriate  Affect:  Appropriate  Cognitive:  Appropriate and Oriented  Insight: Appropriate  Engagement in Group:  Engaged  Modes of Intervention:  Discussion and Support  Additional Comments:  N/A  Butler LITTIE Gelineau 07/08/2024, 10:09 PM

## 2024-07-08 NOTE — Progress Notes (Signed)
   07/08/24 2220  Psych Admission Type (Psych Patients Only)  Admission Status Voluntary  Psychosocial Assessment  Patient Complaints Self-harm thoughts;Depression  Eye Contact Brief  Facial Expression Flat  Affect Flat  Speech Soft  Interaction Guarded;Isolative  Motor Activity Slow  Appearance/Hygiene In scrubs  Behavior Characteristics Cooperative  Mood Depressed;Pleasant  Aggressive Behavior  Effect No apparent injury  Thought Process  Coherency WDL  Content WDL  Delusions None reported or observed  Perception WDL  Hallucination None reported or observed  Judgment Impaired  Confusion WDL  Danger to Self  Current suicidal ideation? Active;Verbalizes;Plan  Description of Suicide Plan overdose on medication  Self-Injurious Behavior Some self-injurious ideation observed or expressed.  No lethal plan expressed   Agreement Not to Harm Self Yes  Description of Agreement verbal  Danger to Others  Danger to Others None reported or observed

## 2024-07-08 NOTE — Progress Notes (Signed)
   07/08/24 1041  Psych Admission Type (Psych Patients Only)  Admission Status Voluntary  Psychosocial Assessment  Patient Complaints Self-harm thoughts;Depression  Eye Contact Brief  Facial Expression Flat  Affect Flat  Speech Soft  Interaction Guarded;Isolative  Motor Activity Slow  Appearance/Hygiene In scrubs  Behavior Characteristics Cooperative  Mood Depressed;Pleasant  Aggressive Behavior  Effect No apparent injury  Thought Process  Coherency WDL  Content WDL  Delusions None reported or observed  Perception WDL  Hallucination None reported or observed  Judgment Impaired  Confusion WDL  Danger to Self  Current suicidal ideation? Active;Verbalizes;Plan  Description of Suicide Plan overdose on medication  Self-Injurious Behavior Some self-injurious ideation observed or expressed.  No lethal plan expressed   Agreement Not to Harm Self Yes  Description of Agreement verbal  Danger to Others  Danger to Others None reported or observed

## 2024-07-08 NOTE — Group Note (Signed)
 Date:  07/08/2024 Time:  5:15 PM  Group Topic/Focus:  Goals Group:   The focus of this group is to help patients establish daily goals to achieve during treatment and discuss how the patient can incorporate goal setting into their daily lives to aide in recovery.    Participation Level:  Did Not Attend   Andrea Jenkins 07/08/2024, 5:15 PM

## 2024-07-08 NOTE — [facility] Counselor (Signed)
 Adult Comprehensive Assessment  Patient ID: Andrea Jenkins, female   DOB: 06-30-1996, 28 y.o.   MRN: [id number]  Information Source: Information source: Patient  Current Stressors:  Patient states their primary concerns and needs for treatment are::  To get back on medication Patient states their goals for this hospitilization and ongoing recovery are:: To stop feeling suicidal and to start taking my medications Educational / Learning stressors: none to report Employment / Job issues: none to report Family Relationships: none to report Financial / Lack of resources (include bankruptcy): none to report Housing / Lack of housing: none to report Physical health (include injuries & life threatening diseases): none to report Social relationships: none to report Substance abuse: alcohol and vaping nicotine  Bereavement / Loss: Loss of Grandfather last year  Living/Environment/Situation:  Living Arrangements: Children, Parent Living conditions (as described by patient or guardian): good Who else lives in the home?: mother and patients children How long has patient lived in current situation?: unknown, patient states that she will be moving back in with mother once discharge  Family History:  Marital status: Single Has your sexual activity been affected by drugs, alcohol, medication, or emotional stress?: none reported Does patient have children?: Yes How many children?: 4 How is patient's relationship with their children?: close  Childhood History:  By whom was/is the patient raised?: Mother Additional childhood history information:  My mom had all of us  and couldn't do alot of things for us  Description of patient's relationship with caregiver when they were a child: Good Patient's description of current relationship with people who raised him/her: Good How were you disciplined when you got in trouble as a child/adolescent?: Normal, no abuse Does patient have siblings?:  Yes Number of Siblings: 3 Description of patient's current relationship with siblings: When we not arguing we good Did patient suffer any verbal/emotional/physical/sexual abuse as a child?: No Did patient suffer from severe childhood neglect?: No Has patient ever been sexually abused/assaulted/raped as an adolescent or adult?: No Was the patient ever a victim of a crime or a disaster?: No Witnessed domestic violence?: No Has patient been affected by domestic violence as an adult?: No Description of domestic violence: Patient denied experiencing DV  Education:  Currently a Consulting civil engineer?: No Learning disability?: No  Employment/Work Situation:   Employment Situation: Unemployed Where is Patient Currently Employed?: No How Long has Patient Been Employed?: (P) unknown Are You Satisfied With Your Job?: (P) Yes Do You Work More Than One Job?: (P) No Work Stressors: (P) none Patient's Job has Been Impacted by Current Illness: No What is the Longest Time Patient has Held a Job?: 2 years Where was the Patient Employed at that Time?: [employer] Has Patient ever Been in the [employer]?: No  Financial Resources:   Surveyor, quantity resources: [employer], Food stamps Does patient have a Lawyer or guardian?: (P) No  Alcohol/Substance Abuse:   What has been your use of drugs/alcohol within the last 12 months?: alcohol every weekend, and sometimes a few days during the week. If attempted suicide, did drugs/alcohol play a role in this?: No Alcohol/Substance Abuse Treatment Hx: Denies past history Has alcohol/substance abuse ever caused legal problems?:  (unknown)  Social Support System:   Patient's Community Support System: Good Describe Community Support System: Mother and siblings are supportive Type of faith/religion: none  Leisure/Recreation:   Do You Have Hobbies?: No  Strengths/Needs:      Discharge Plan:   Currently receiving community mental health services: No Patient  states concerns and  preferences for aftercare planning are: Patient would like to be referred for medication management Patient states they will know when they are safe and ready for discharge when: No suicidal thoughts Does patient have access to transportation?: No Does patient have financial barriers related to discharge medications?: No Patient description of barriers related to discharge medications: I will need a ride home Plan for no access to transportation at discharge: Taxi voucher Will patient be returning to same living situation after discharge?: Yes (Will continue to live with mother)  Summary/Recommendations:   Summary and Recommendations (to be completed by the evaluator): Patient is 28 y/o african [place] female admitted on 07/07/2024 and diagnosed with Bipolar 1, depressed severe. Patient is unemployed and lives with her mother, along with her 4 children ages 93,4,3,1. Patient admits to having suicidal ideation and depression for the past 6 months. Patients states that she wants to get back on her medication and her goals are to stop feeling depressed and sucidal. Patient currently does not have barriers to medication, but admits to having transportion barriers and will need a taxi to get home at discharge. Patient states that she has good social support with her mother and siblings. Patient currently endorsed SI, denies HI, or AVH. Patient will like to follow up for medication management after discharge. Patient will benefit from crisis stabilization, medication evaluation, group therapy and psychoeducation, in addition to case management for discharge planning. At discharge it is recommended that Patient adhere to the established discharge plan and continue in treatment.  Andrea Jenkins. 07/08/2024

## 2024-07-08 NOTE — Plan of Care (Signed)
  Problem: Education: Goal: Knowledge of [facility] Education information/materials will improve Outcome: Progressing   Problem: Education: Goal: Emotional status will improve Outcome: Progressing   Problem: Education: Goal: Mental status will improve Outcome: Progressing   

## 2024-07-08 NOTE — [facility] Suicide Risk Assessment (Signed)
 [facility] INPATIENT:  Family/Significant Other Suicide Prevention Education  Suicide Prevention Education:  Education Completed; Viona Hosking Mother [phone],  (name of family member/significant other) has been identified by the patient as the family member/significant other with whom the patient will be residing, and identified as the person(s) who will aid the patient in the event of a mental health crisis (suicidal ideations/suicide attempt).  With written consent from the patient, the family member/significant other has been provided the following suicide prevention education, prior to the and/or following the discharge of the patient.  The suicide prevention education provided includes the following: Suicide risk factors Suicide prevention and interventions National Suicide Hotline telephone number [facility] assessment telephone number [facility] Emergency Assistance 911 [facility] and/or Residential Mobile Crisis Unit telephone number  Request made of family/significant other to: Remove weapons (e.g., guns, rifles, knives), all items previously/currently identified as safety concern.   Remove drugs/medications (over-the-counter, prescriptions, illicit drugs), all items previously/currently identified as a safety concern.  The family member/significant other verbalizes understanding of the suicide prevention education information provided.  The family member/significant other agrees to remove the items of safety concern listed above.  Pamila Nine 07/08/2024, 2:37 PM
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
## 2024-07-09 NOTE — Plan of Care (Signed)

## 2024-07-09 NOTE — Plan of Care (Signed)
   Problem: Education: Goal: Knowledge of [facility] Education information/materials will improve Outcome: Progressing   Problem: Education: Goal: Emotional status will improve Outcome: Progressing   Problem: Education: Goal: Mental status will improve Outcome: Progressing   Problem: Education: Goal: Verbalization of understanding the information provided will improve Outcome: Progressing

## 2024-07-09 NOTE — Progress Notes (Signed)
   07/09/24 1010  Psych Admission Type (Psych Patients Only)  Admission Status Voluntary  Psychosocial Assessment  Patient Complaints Depression  Eye Contact Brief  Facial Expression Flat  Affect Flat  Speech Soft  Interaction Guarded;Isolative  Motor Activity Slow  Appearance/Hygiene In scrubs  Behavior Characteristics Cooperative  Mood Depressed;Pleasant  Aggressive Behavior  Effect No apparent injury  Thought Process  Coherency WDL  Content WDL  Delusions None reported or observed  Perception WDL  Hallucination None reported or observed  Judgment Impaired  Confusion WDL  Danger to Self  Current suicidal ideation? Active;Verbalizes;Plan  Self-Injurious Behavior Some self-injurious ideation observed or expressed.  No lethal plan expressed   Agreement Not to Harm Self Yes  Description of Agreement verbal  Danger to Others  Danger to Others None reported or observed
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
## 2024-07-09 NOTE — Group Note (Signed)
 Recreation Therapy Group Note   Group Topic:Coping Skills  Group Date: 07/09/2024 Start Time: 1230 End Time: 1320 Facilitators: Celestia Jeoffrey BRAVO, LRT, CTRS Location: Craft Room  Group Description: Mind Map.  Patient was provided a blank template of a diagram with 32 blank boxes in a tiered system, branching from the center (similar to a bubble chart). LRT directed patients to label the middle of the diagram Coping Skills. LRT and patients then came up with 8 different coping skills as examples. Pt were directed to record their coping skills in the 2nd tier boxes closest to the center.  Patients would then share their coping skills with the group as LRT wrote them out. LRT gave a handout of 99 different coping skills at the end of group.   Goal Area(s) Addressed: Patients will be able to define "coping skills". Patient will identify new coping skills.  Patient will increase communication.  Affect/Mood: Appropriate   Participation Level: Active and Engaged   Participation Quality: Independent   Behavior: Calm and Cooperative   Speech/Thought Process: Coherent   Insight: Fair   Judgement: Fair    Modes of Intervention: Clarification, Education, Worksheet, and Writing   Patient Response to Interventions:  Attentive and Receptive   Education Outcome:  Acknowledges education   Clinical Observations/Individualized Feedback: Andrea Jenkins was active in their participation of session activities and group discussion. Pt identified draw and paint as coping skills.   Plan: Continue to engage patient in RT group sessions 2-3x/week.   Jeoffrey BRAVO Celestia, LRT, CTRS 07/09/2024 1:49 PM

## 2024-07-09 NOTE — BH IP Treatment Plan (Signed)
 Interdisciplinary Treatment and Diagnostic Plan Update  07/09/2024 Time of Session: 10:35 AMYE Jenkins MRN: [id number]  Principal Diagnosis: Bipolar 1 disorder, depressed, severe (HCC)  Secondary Diagnoses: Principal Problem:   Bipolar 1 disorder, depressed, severe (HCC)   Current Medications:  Current Facility-Administered Medications  Medication Dose Route Frequency Provider Last Rate Last Admin   acetaminophen  (TYLENOL ) tablet 650 mg  650 mg Oral Q6H PRN Arloa Suzen RAMAN, NP       alum & mag hydroxide-simeth (MAALOX/MYLANTA) 200-200-20 MG/5ML suspension 30 mL  30 mL Oral Q4H PRN Arloa Suzen RAMAN, NP       atomoxetine  (STRATTERA ) capsule 40 mg  40 mg Oral Daily Andrea, Matthew E, PA-C   40 mg at 07/09/24 0801   haloperidol  (HALDOL ) tablet 5 mg  5 mg Oral TID PRN Arloa Suzen RAMAN, NP       And   diphenhydrAMINE  (BENADRYL ) capsule 50 mg  50 mg Oral TID PRN Arloa Suzen RAMAN, NP       haloperidol  lactate (HALDOL ) injection 5 mg  5 mg Intramuscular TID PRN Arloa Suzen RAMAN, NP       And   diphenhydrAMINE  (BENADRYL ) injection 50 mg  50 mg Intramuscular TID PRN Arloa Suzen RAMAN, NP       And   LORazepam  (ATIVAN ) injection 2 mg  2 mg Intramuscular TID PRN Arloa Suzen RAMAN, NP       haloperidol  lactate (HALDOL ) injection 10 mg  10 mg Intramuscular TID PRN Arloa Suzen RAMAN, NP       And   diphenhydrAMINE  (BENADRYL ) injection 50 mg  50 mg Intramuscular TID PRN Arloa Suzen RAMAN, NP       And   LORazepam  (ATIVAN ) injection 2 mg  2 mg Intramuscular TID PRN Arloa Suzen RAMAN, NP       hydrOXYzine  (ATARAX ) tablet 25 mg  25 mg Oral TID PRN Arloa Suzen RAMAN, NP       loratadine  (CLARITIN ) tablet 10 mg  10 mg Oral Daily Andrea, Matthew E, PA-C   10 mg at 07/09/24 9197   lurasidone  (LATUDA ) tablet 20 mg  20 mg Oral Q breakfast Andrea, Matthew E, PA-C   20 mg at 07/09/24 0802   Followed by   NOREEN ON 07/12/2024] lurasidone  (LATUDA ) tablet 40 mg  40 mg Oral Q  breakfast Andrea, Matthew E, PA-C       magnesium  hydroxide (MILK OF MAGNESIA) suspension 30 mL  30 mL Oral Daily PRN Arloa Suzen RAMAN, NP       nicotine  (NICODERM CQ  - dosed in mg/24 hours) patch 21 mg  21 mg Transdermal Daily Andrea, Matthew E, PA-C   21 mg at 07/09/24 9196   traZODone  (DESYREL ) tablet 50 mg  50 mg Oral QHS Arloa Suzen RAMAN, NP   50 mg at 07/08/24 2118   PTA Medications: Medications Prior to Admission  Medication Sig Dispense Refill Last Dose/Taking   ARIPiprazole  (ABILIFY ) 10 MG tablet Take 1 tablet (10 mg total) by mouth daily. (Patient not taking: Reported on 05/01/2024) 30 tablet 2    ARIPiprazole  (ABILIFY ) 5 MG tablet Take 5 mg by mouth daily. (Patient not taking: Reported on 05/01/2024)      ARIPiprazole  ER (ABILIFY  MAINTENA) 400 MG PRSY prefilled syringe Inject 400 mg into the muscle every 28 (twenty-eight) days. (Patient not taking: Reported on 05/01/2024) 1 each 4    atomoxetine  (STRATTERA ) 25 MG capsule Take 25 mg by mouth at bedtime. (Patient not taking: Reported on 05/01/2024)  busPIRone (BUSPAR) 5 MG tablet Take 5 mg by mouth 3 (three) times daily. (Patient not taking: Reported on 05/01/2024)      cetirizine  (ZYRTEC ) 10 MG tablet Take 10 mg by mouth daily.      clindamycin  (CLEOCIN ) 300 MG capsule Take 1 capsule (300 mg total) by mouth 3 (three) times daily. (Patient not taking: Reported on 05/01/2024) 14 capsule 0    EPINEPHrine  0.3 mg/0.3 mL IJ SOAJ injection Inject 0.3 mg into the muscle as needed.      famotidine  (PEPCID ) 20 MG tablet Take 1 tablet (20 mg total) by mouth 2 (two) times daily for 7 days. (Patient not taking: Reported on 05/01/2024) 14 tablet 0    fluconazole  (DIFLUCAN ) 150 MG tablet Take 150 mg by mouth once. (Patient not taking: Reported on 05/01/2024)      orlistat  (ALLI ) 60 MG capsule Take 60 mg by mouth 3 (three) times daily with meals. (Patient not taking: Reported on 05/01/2024)      sertraline  (ZOLOFT ) 25 MG tablet Take 1 tablet (25 mg total)  by mouth daily. (Patient not taking: Reported on 05/01/2024) 30 tablet 2    traZODone  (DESYREL ) 50 MG tablet Take 50 mg by mouth at bedtime. (Patient not taking: Reported on 05/01/2024)       Patient Stressors:    Patient Strengths:    Treatment Modalities: Medication Management, Group therapy, Case management,  1 to 1 session with clinician, Psychoeducation, Recreational therapy.   Physician Treatment Plan for Primary Diagnosis: Bipolar 1 disorder, depressed, severe (HCC) Long Term Goal(s): Improvement in symptoms so as ready for discharge   Short Term Goals: Ability to identify and develop effective coping behaviors will improve Ability to maintain clinical measurements within normal limits will improve Compliance with prescribed medications will improve Ability to identify triggers associated with substance abuse/mental health issues will improve  Medication Management: Evaluate patient's response, side effects, and tolerance of medication regimen.  Therapeutic Interventions: 1 to 1 sessions, Unit Group sessions and Medication administration.  Evaluation of Outcomes: Not Met  Physician Treatment Plan for Secondary Diagnosis: Principal Problem:   Bipolar 1 disorder, depressed, severe (HCC)  Long Term Goal(s): Improvement in symptoms so as ready for discharge   Short Term Goals: Ability to identify and develop effective coping behaviors will improve Ability to maintain clinical measurements within normal limits will improve Compliance with prescribed medications will improve Ability to identify triggers associated with substance abuse/mental health issues will improve     Medication Management: Evaluate patient's response, side effects, and tolerance of medication regimen.  Therapeutic Interventions: 1 to 1 sessions, Unit Group sessions and Medication administration.  Evaluation of Outcomes: Not Met   RN Treatment Plan for Primary Diagnosis: Bipolar 1 disorder, depressed,  severe (HCC) Long Term Goal(s): Knowledge of disease and therapeutic regimen to maintain health will improve  Short Term Goals: Ability to remain free from injury will improve, Ability to verbalize frustration and anger appropriately will improve, Ability to demonstrate self-control, Ability to participate in decision making will improve, Ability to verbalize feelings will improve, Ability to disclose and discuss suicidal ideas, Ability to identify and develop effective coping behaviors will improve, and Compliance with prescribed medications will improve  Medication Management: RN will administer medications as ordered by provider, will assess and evaluate patient's response and provide education to patient for prescribed medication. RN will report any adverse and/or side effects to prescribing provider.  Therapeutic Interventions: 1 on 1 counseling sessions, Psychoeducation, Medication administration, Evaluate responses to treatment,  Monitor vital signs and CBGs as ordered, Perform/monitor CIWA, COWS, AIMS and Fall Risk screenings as ordered, Perform wound care treatments as ordered.  Evaluation of Outcomes: Not Met   LCSW Treatment Plan for Primary Diagnosis: Bipolar 1 disorder, depressed, severe (HCC) Long Term Goal(s): Safe transition to appropriate next level of care at discharge, Engage patient in therapeutic group addressing interpersonal concerns.  Short Term Goals: Engage patient in aftercare planning with referrals and resources, Increase social support, Increase ability to appropriately verbalize feelings, Increase emotional regulation, Facilitate acceptance of mental health diagnosis and concerns, Identify triggers associated with mental health/substance abuse issues, and Increase skills for wellness and recovery  Therapeutic Interventions: Assess for all discharge needs, 1 to 1 time with Social worker, Explore available resources and support systems, Assess for adequacy in community  support network, Educate family and significant other(s) on suicide prevention, Complete Psychosocial Assessment, Interpersonal group therapy.  Evaluation of Outcomes: Not Met   Progress in Treatment: Attending groups: Yes. and No. Participating in groups: Yes. Taking medication as prescribed: Yes. Toleration medication: Yes. Family/Significant other contact made: Yes, individual(s) contacted:  mother, Kizzy Desch.  Patient understands diagnosis: Yes. Discussing patient identified problems/goals with staff: Yes. Medical problems stabilized or resolved: Yes. Denies suicidal/homicidal ideation: Yes. Issues/concerns per patient self-inventory: No. Other: none.  New problem(s) identified: No, Describe:  none identified.  New Short Term/Long Term Goal(s): medication management for mood stabilization; elimination of SI thoughts; development of comprehensive mental wellness plan.  Patient Goals:  To be on my medicine consistently.   Discharge Plan or Barriers: CSW will assist pt with development of an appropriate aftercare/discharge plan.   Reason for Continuation of Hospitalization: Depression Medication stabilization Suicidal ideation  Estimated Length of Stay: 1-7 days  Last 3 [place] Suicide Severity Risk Score: Flowsheet Row Admission (Current) from 07/07/2024 in [facility] Most recent reading at 07/07/2024  9:30 PM ED from 07/07/2024 in [facility] Most recent reading at 07/07/2024  4:58 PM UC from 06/27/2024 in [facility] Most recent reading at 06/27/2024 10:04 AM  C-SSRS RISK CATEGORY High Risk High Risk No Risk    Last PHQ 2/9 Scores:    10/07/2023   12:11 PM 05/03/2023   11:09 AM 10/06/2022   11:36 AM  Depression screen PHQ 2/9  Decreased Interest 2 3 3   Down, Depressed, Hopeless 2 3 3   PHQ - 2 Score 4 6 6   Altered sleeping 2 3 3   Tired, decreased energy 2 3 3   Change in  appetite 2 3 3   Feeling bad or failure about yourself  2 3 3   Trouble concentrating 2  3  Moving slowly or fidgety/restless 0 0 1  Suicidal thoughts 1 1 2   PHQ-9 Score 15 19 24   Difficult doing work/chores Somewhat difficult Somewhat difficult Extremely dIfficult    Scribe for Treatment Team: Nadara JONELLE Fam, LCSW 07/09/2024 11:24 AM

## 2024-07-10 NOTE — Group Note (Signed)
 Date:  07/10/2024 Time:  5:41 PM  Group Topic/Focus:  Wellness Toolbox:   The focus of this group is to discuss various aspects of wellness, balancing those aspects and exploring ways to increase the ability to experience wellness.  Patients will create a wellness toolbox for use upon discharge.    Participation Level:  Did Not Attend   Deitra Clap [facility] 07/10/2024, 5:41 PM

## 2024-07-10 NOTE — Progress Notes (Signed)
 Pt is seen in the dayroom playing cards with peers. She denies SI/HI/AVH at this time. She is looking forward to going home to her children tomorrow. Denies anxiety and depression. She c/o nausea. PRN Maalox given as per order. Pt given saltine crackers and ginger ale as well. PRN was effective. She is med compliant with Q 15 min safety checks in place.
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
## 2024-07-10 NOTE — Plan of Care (Signed)
   Problem: Education: Goal: Emotional status will improve Outcome: Progressing Goal: Mental status will improve Outcome: Progressing

## 2024-07-10 NOTE — Plan of Care (Signed)
  Problem: Education: Goal: Emotional status will improve Outcome: Progressing Goal: Mental status will improve Outcome: Progressing   Problem: Coping: Goal: Ability to verbalize frustrations and anger appropriately will improve Outcome: Progressing   Problem: Health Behavior/Discharge Planning: Goal: Compliance with treatment plan for underlying cause of condition will improve Outcome: Progressing   Problem: Physical Regulation: Goal: Ability to maintain clinical measurements within normal limits will improve Outcome: Progressing   Problem: Safety: Goal: Periods of time without injury will increase Outcome: Progressing

## 2024-07-10 NOTE — Group Note (Signed)
 Date:  07/10/2024 Time:  8:58 PM  Group Topic/Focus:  Spirituality:   The focus of this group is to discuss how one's spirituality can aide in recovery.    Participation Level:  Active  Participation Quality:  Appropriate, Attentive, and Sharing  Affect:  Appropriate  Cognitive:  Alert and Appropriate  Insight: Appropriate and Good  Engagement in Group:  Developing/Improving and Engaged  Modes of Intervention:  Discussion, Education, Problem-solving, Rapport Building, Dance movement psychotherapist, and Support  Additional Comments:     Julienne Vogler 07/10/2024, 8:58 PM

## 2024-07-10 NOTE — Group Note (Signed)
 Date:  07/10/2024 Time:  1:56 PM  Group Topic/Focus:  Making Healthy Choices:   The focus of this group is to help patients identify negative/unhealthy choices they were using prior to admission and identify positive/healthier coping strategies to replace them upon discharge.    Participation Level:  Did Not Attend   Andrea Jenkins 07/10/2024, 1:56 PM

## 2024-07-10 NOTE — Progress Notes (Signed)
   07/09/24 2200  Psych Admission Type (Psych Patients Only)  Admission Status Voluntary  Psychosocial Assessment  Patient Complaints Depression  Eye Contact Brief  Facial Expression Flat  Affect Flat  Speech Soft  Interaction Guarded  Motor Activity Slow  Appearance/Hygiene In scrubs  Behavior Characteristics Cooperative  Mood Depressed  Thought Process  Coherency WDL  Content WDL  Delusions None reported or observed  Perception WDL  Hallucination None reported or observed  Judgment Impaired  Confusion WDL  Danger to Self  Current suicidal ideation? Passive  Self-Injurious Behavior Some self-injurious ideation observed or expressed.  No lethal plan expressed   Agreement Not to Harm Self No  Description of Agreement verbal

## 2024-07-10 NOTE — Group Note (Signed)
 Recreation Therapy Group Note   Group Topic:Emotion Expression  Group Date: 07/10/2024 Start Time: 1000 End Time: 1055 Facilitators: Celestia Jeoffrey BRAVO, LRT, CTRS Location: Craft Room  Group Description: Painting a Diplomatic Services operational officer. Patients and LRT discuss what it means to be "at peace", what it feels like physically and mentally. Pts are given a canvas and watercolor paint to use and encouraged to draw their idea of a peaceful place. Pts and LRT discuss how they use this in their daily life post discharge. Pts are encouraged to take their canvas home with them as a reminder to find their peaceful place whenever they are feeling depressed, anxious, etc.    Goal Area(s) Addressed:  Patient will identify what it means to experience a "peaceful" emotion. Patient will identify a new coping skill.  Patient will express their emotions through art. Patients will increase communication by talking with LRT and peers while in group.   Affect/Mood: Appropriate   Participation Level: Active and Engaged   Participation Quality: Independent   Behavior: Appropriate, Calm, and Cooperative   Speech/Thought Process: Coherent   Insight: Good   Judgement: Good   Modes of Intervention: Art   Patient Response to Interventions:  Attentive, Engaged, Interested , and Receptive   Education Outcome:  Acknowledges education   Clinical Observations/Individualized Feedback: Regene was active in their participation of session activities and group discussion. Pt identified nature as her peaceful place. Pt shared that she was going to give her painting to her children. Pt interacted well with LRT and peers duration of session.    Plan: Continue to engage patient in RT group sessions 2-3x/week.   Jeoffrey BRAVO Celestia, LRT, CTRS 07/10/2024 12:57 PM

## 2024-07-10 NOTE — Progress Notes (Signed)
   07/10/24 0854  Psych Admission Type (Psych Patients Only)  Admission Status Voluntary/72 hour document signed  Date 72 hour document signed  07/10/24  Time 72 hour document signed  0854  Provider Notified (First and Last Name) (see details for LINK to note) Camelia Lukes, PA-C  Psychosocial Assessment  Patient Complaints Irritability  Eye Contact Brief  Facial Expression Flat  Affect Irritable  Speech Logical/coherent  Interaction Isolative;Guarded  Motor Activity Slow  Appearance/Hygiene Unremarkable  Behavior Characteristics Cooperative;Irritable  Mood Depressed;Irritable (Patient reports her goal for today is going home. She reports she misses her kids.)  Thought Process  Coherency WDL  Content WDL  Delusions None reported or observed  Perception WDL  Hallucination None reported or observed  Judgment Poor  Confusion None  Danger to Self  Current suicidal ideation? Denies  Self-Injurious Behavior No self-injurious ideation or behavior indicators observed or expressed   Danger to Others  Danger to Others None reported or observed

## 2024-07-11 MED ORDER — LURASIDONE HCL 20 MG PO TABS: 20.0000 mg | ORAL_TABLET | Freq: Every day | ORAL | 0 refills | Status: DC

## 2024-07-11 MED ORDER — TRAZODONE HCL 50 MG PO TABS: 50.0000 mg | ORAL_TABLET | Freq: Every day | ORAL | 0 refills | Status: AC

## 2024-07-11 MED ORDER — ATOMOXETINE HCL 40 MG PO CAPS: 40.0000 mg | ORAL_CAPSULE | Freq: Every day | ORAL | 0 refills | Status: DC

## 2024-07-11 MED ORDER — LORATADINE 10 MG PO TABS: 10.0000 mg | ORAL_TABLET | Freq: Every day | ORAL | 0 refills | Status: DC

## 2024-07-11 MED ORDER — NICOTINE 21 MG/24HR TD PT24: 21.0000 mg | MEDICATED_PATCH | Freq: Every day | TRANSDERMAL | 0 refills | Status: DC

## 2024-07-11 NOTE — Plan of Care (Signed)

## 2024-07-11 NOTE — Progress Notes (Signed)
   07/11/24 0900  Psych Admission Type (Psych Patients Only)  Admission Status Voluntary/72 hour document signed  Date 72 hour document signed  07/10/24  Time 72 hour document signed  0854  Psychosocial Assessment  Patient Complaints None  Eye Contact Fair  Facial Expression Sullen  Affect Sullen  Speech Logical/coherent  Interaction Assertive  Motor Activity Other (Comment) (appropriate for developmental age)  Appearance/Hygiene Unremarkable  Behavior Characteristics Cooperative  Mood Pleasant;Sullen (Patient writes her goal for today was staying consistant.)  Thought Process  Coherency WDL  Content WDL  Delusions None reported or observed  Perception WDL  Hallucination None reported or observed  Judgment Poor  Confusion None  Danger to Self  Current suicidal ideation? Denies  Self-Injurious Behavior No self-injurious ideation or behavior indicators observed or expressed   Danger to Others  Danger to Others None reported or observed

## 2024-07-11 NOTE — [facility] Suicide Risk Assessment (Signed)
 [facility] Discharge Suicide Risk Assessment   Principal Problem: Bipolar 1 disorder, depressed, severe (HCC) Discharge Diagnoses: Principal Problem:   Bipolar 1 disorder, depressed, severe (HCC)   Total Time spent with patient: 30 minutes  Musculoskeletal: Strength & Muscle Tone: within normal limits Gait & Station: normal Patient leans: N/A  Psychiatric Specialty Exam  Presentation  General Appearance:  Casual  Eye Contact: Good  Speech: Clear and Coherent  Speech Volume: Normal  Handedness:No data recorded  Mood and Affect  Mood: Euthymic  Duration of Depression Symptoms: Greater than two weeks  Affect: Appropriate   Thought Process  Thought Processes: Coherent  Descriptions of Associations:Intact  Orientation:Full (Time, Place and Person)  Thought Content:Logical  History of Schizophrenia/Schizoaffective disorder:No  Duration of Psychotic Symptoms:No data recorded Hallucinations:Hallucinations: None  Ideas of Reference:None  Suicidal Thoughts:Suicidal Thoughts: No  Homicidal Thoughts:Homicidal Thoughts: No   Sensorium  Memory: Immediate Good; Recent Good; Remote Good  Judgment: Fair  Insight: Fair   Art therapist  Concentration: Fair  Attention Span: Fair  Recall: Good  Fund of Knowledge: Fair  Language: Fair   Psychomotor Activity  Psychomotor Activity: Psychomotor Activity: Normal   Assets  Assets: Desire for Improvement; Housing; Social Support; Communication Skills   Sleep  Sleep: Sleep: Fair  Estimated Sleeping Duration (Last 24 Hours): 6.50-9.25 hours  Physical Exam: Physical Exam ROS Blood pressure 106/62, pulse 71, temperature 98.7 F (37.1 C), temperature source Oral, resp. rate 17, height 5' 4 (1.626 m), weight 96.2 kg, last menstrual period 06/20/2024, SpO2 100%, not currently breastfeeding. Body mass index is 36.39 kg/m.  Mental Status Per Nursing Assessment::   On Admission:  Suicidal  ideation indicated by patient  Demographic Factors:  Unemployed  Loss Factors: Decrease in vocational status  Historical Factors: NA  Risk Reduction Factors:   Responsible for children under 81 years of age, Sense of responsibility to family, Living with another person, especially a relative, and Positive social support  Continued Clinical Symptoms:  Bipolar Disorder:   Depressive phase  Cognitive Features That Contribute To Risk:  None    Suicide Risk:  Minimal: No identifiable suicidal ideation.  Patients presenting with no risk factors but with morbid ruminations; may be classified as minimal risk based on the severity of the depressive symptoms   Follow-up Information     [facility] Go on 08/01/2024.   Why: In person intake appointment August 01, 2024 at 10:00 am.   Therapy and Medication Management appointments will be made at intake.   Patient will need to complete paper work sent to email before the apointment. Contact information: [address] [place] [address] [phone]                 Plan Of Care/Follow-up recommendations:  Activity:  as tolerated  Camelia LITTIE Lukes, PA-C 07/11/2024, 9:13 AM

## 2024-07-11 NOTE — Progress Notes (Signed)
  [facility] Adult Case Management Discharge Plan :  Will you be returning to the same living situation after discharge:  Yes,  pt plans to return to her mother's.  At discharge, do you have transportation home?: Yes,  pt received taxi voucher. Do you have the ability to pay for your medications: Yes,  [place] MEDICAID PREPAID HEALTH PLAN / [place] MEDICAID HEALTHY BLUE  Release of information consent forms completed and in the chart;  Patient's signature needed at discharge.  Patient to Follow up at:  Follow-up Information     [facility] Go on 08/01/2024.   Why: In person intake appointment August 01, 2024 at 10:00 am.   Therapy and Medication Management appointments will be made at intake.   Patient will need to complete paper work sent to email before the apointment. Contact information: [address] [place] [address] [phone]                 Next level of care provider has access to [facility] Link:no  Safety Planning and Suicide Prevention discussed: Yes,  SPE completed with mother, Margarette Vannatter.      Has patient been referred to the Quitline?: Patient refused referral for treatment  Patient has been referred for addiction treatment: No known substance use disorder.  Nadara JONELLE Fam, LCSW 07/11/2024, 9:42 AM

## 2024-07-11 NOTE — Plan of Care (Signed)

## 2024-07-11 NOTE — Progress Notes (Signed)
 Patient denies SI/I/AVH at this time. Discharge instructions, AVS, prescriptions, and transition record reviewed with patient. Patient agrees to comply with medication management, follow-up visit and outpatient therapy. Patient belongings returned to patient. Patient questions and concerns addressed and answered.  Patient ambulatory off unit. Patient discharged home via taxi.
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
## 2024-07-17 DIAGNOSIS — Z3200 Encounter for pregnancy test, result unknown: Secondary | ICD-10-CM | POA: Diagnosis not present

## 2024-07-17 DIAGNOSIS — Z6836 Body mass index (BMI) 36.0-36.9, adult: Secondary | ICD-10-CM | POA: Diagnosis not present

## 2024-07-17 DIAGNOSIS — E6609 Other obesity due to excess calories: Secondary | ICD-10-CM | POA: Diagnosis not present

## 2024-07-17 DIAGNOSIS — N898 Other specified noninflammatory disorders of vagina: Secondary | ICD-10-CM | POA: Diagnosis not present

## 2024-07-17 DIAGNOSIS — E559 Vitamin D deficiency, unspecified: Secondary | ICD-10-CM | POA: Diagnosis not present

## 2024-08-10 ENCOUNTER — Ambulatory Visit
Admission: EM | Admit: 2024-08-10 | Discharge: 2024-08-10 | Disposition: A | Discharge status: Home / Self Care | Attending: Nurse Practitioner | Admitting: Nurse Practitioner

## 2024-08-10 ENCOUNTER — Encounter: Payer: Self-pay | Admitting: Emergency Medicine

## 2024-08-10 DIAGNOSIS — Z113 Encounter for screening for infections with a predominantly sexual mode of transmission: Secondary | ICD-10-CM | POA: Diagnosis not present

## 2024-08-10 DIAGNOSIS — N760 Acute vaginitis: Secondary | ICD-10-CM | POA: Insufficient documentation

## 2024-08-10 DIAGNOSIS — B9689 Other specified bacterial agents as the cause of diseases classified elsewhere: Secondary | ICD-10-CM | POA: Diagnosis not present

## 2024-08-10 DIAGNOSIS — Z3201 Encounter for pregnancy test, result positive: Secondary | ICD-10-CM | POA: Diagnosis present

## 2024-08-10 DIAGNOSIS — Z202 Contact with and (suspected) exposure to infections with a predominantly sexual mode of transmission: Secondary | ICD-10-CM | POA: Diagnosis not present

## 2024-08-10 DIAGNOSIS — N898 Other specified noninflammatory disorders of vagina: Secondary | ICD-10-CM | POA: Diagnosis present

## 2024-08-10 LAB — POCT URINE PREGNANCY: Preg Test, Ur: NEGATIVE

## 2024-08-10 MED ORDER — CLINDAMYCIN HCL 300 MG PO CAPS: 300.0000 mg | ORAL_CAPSULE | Freq: Two times a day (BID) | ORAL | 0 refills | Status: AC

## 2024-08-10 MED ORDER — FLUCONAZOLE 150 MG PO TABS: 150.0000 mg | ORAL_TABLET | ORAL | 0 refills | Status: AC

## 2024-08-10 NOTE — ED Triage Notes (Signed)
 Pt presents requesting STD testing, no blood work. Pt reports sxs as discharge with slight itch. Last sexual encounter a week ago.SABRA
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
## 2024-08-10 NOTE — Discharge Instructions (Addendum)
 You were seen today for vaginal discharge, which can occur when the normal balance of bacteria in the vagina changes. This can be influenced by various factors, including new or multiple sexual partners, unprotected sex, douching, smoking, certain antibiotics, or pregnancy. It's also possible to develop a vaginal infection even without being sexually active. Tests were performed today to check for bacteria, yeast, gonorrhea, chlamydia, and trichomonas. While results are pending, treatment has been initiated for the most common non-STD causes--bacterial vaginosis and yeast infection. It is important that you avoid any sexual activity until your test results have returned, your treatment is complete, and your symptoms have fully resolved. You will only be contacted if any of your test results are positive. You can also review your results through your MyChart account. During this time, avoid douching or using vaginal sprays or deodorants. Wear cotton or cotton-lined underwear to improve airflow and reduce moisture. Be sure to stay hydrated by drinking plenty of fluids. See your regular doctor or gynecologist if your symptoms do not start to improve with treatment. Go to the emergency room right away if you develop a fever, new or worsening pelvic pain, or if the vaginal discharge gets worse.

## 2024-08-13 DIAGNOSIS — F900 Attention-deficit hyperactivity disorder, predominantly inattentive type: Secondary | ICD-10-CM | POA: Diagnosis not present

## 2024-08-13 DIAGNOSIS — F411 Generalized anxiety disorder: Secondary | ICD-10-CM | POA: Diagnosis not present

## 2024-08-13 LAB — CERVICOVAGINAL ANCILLARY ONLY
Bacterial Vaginitis (gardnerella): NEGATIVE
Candida Glabrata: NEGATIVE
Candida Vaginitis: NEGATIVE
Chlamydia: NEGATIVE
Comment: NEGATIVE
Comment: NEGATIVE
Comment: NEGATIVE
Comment: NEGATIVE
Comment: NEGATIVE
Comment: NORMAL
Neisseria Gonorrhea: NEGATIVE
Trichomonas: NEGATIVE

## 2024-08-14 ENCOUNTER — Ambulatory Visit

## 2024-08-16 DIAGNOSIS — Z3200 Encounter for pregnancy test, result unknown: Secondary | ICD-10-CM | POA: Diagnosis not present

## 2024-08-16 DIAGNOSIS — E6609 Other obesity due to excess calories: Secondary | ICD-10-CM | POA: Diagnosis not present

## 2024-08-16 DIAGNOSIS — E559 Vitamin D deficiency, unspecified: Secondary | ICD-10-CM | POA: Diagnosis not present

## 2024-08-16 DIAGNOSIS — Z6836 Body mass index (BMI) 36.0-36.9, adult: Secondary | ICD-10-CM | POA: Diagnosis not present

## 2024-09-09 ENCOUNTER — Encounter: Payer: Self-pay | Admitting: Emergency Medicine

## 2024-09-09 ENCOUNTER — Ambulatory Visit: Admission: EM | Admit: 2024-09-09 | Discharge: 2024-09-09 | Disposition: A | Discharge status: Home / Self Care

## 2024-09-09 DIAGNOSIS — Z202 Contact with and (suspected) exposure to infections with a predominantly sexual mode of transmission: Secondary | ICD-10-CM | POA: Insufficient documentation

## 2024-09-09 NOTE — ED Triage Notes (Signed)
 Patient here for STI testing.  Denies any sx's.  States that she has a new partner.  Does have a new partner.
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
## 2024-09-10 LAB — CERVICOVAGINAL ANCILLARY ONLY
Chlamydia: NEGATIVE
Comment: NEGATIVE
Comment: NEGATIVE
Comment: NORMAL
Neisseria Gonorrhea: NEGATIVE
Trichomonas: NEGATIVE

## 2024-09-11 LAB — RPR: RPR Ser Ql: NONREACTIVE

## 2024-09-11 LAB — HIV ANTIBODY (ROUTINE TESTING W REFLEX): HIV Screen 4th Generation wRfx: NONREACTIVE

## 2024-09-24 ENCOUNTER — Ambulatory Visit: Admission: EM | Admit: 2024-09-24 | Discharge: 2024-09-24 | Disposition: A | Discharge status: Home / Self Care

## 2024-09-24 DIAGNOSIS — Z113 Encounter for screening for infections with a predominantly sexual mode of transmission: Secondary | ICD-10-CM | POA: Diagnosis not present

## 2024-09-24 DIAGNOSIS — Z202 Contact with and (suspected) exposure to infections with a predominantly sexual mode of transmission: Secondary | ICD-10-CM | POA: Insufficient documentation

## 2024-09-24 DIAGNOSIS — N898 Other specified noninflammatory disorders of vagina: Secondary | ICD-10-CM | POA: Diagnosis not present

## 2024-09-24 MED ORDER — GENTAMICIN SULFATE 40 MG/ML IJ SOLN
240.0000 mg | Freq: Once | INTRAMUSCULAR | Status: AC
  Administered 2024-09-24: 240 mg via INTRAMUSCULAR

## 2024-09-24 MED ORDER — DOXYCYCLINE HYCLATE 100 MG PO TABS: 100.0000 mg | ORAL_TABLET | Freq: Two times a day (BID) | ORAL | 0 refills | Status: AC

## 2024-09-24 MED ORDER — AZITHROMYCIN 500 MG PO TABS
2000.0000 mg | ORAL_TABLET | Freq: Once | ORAL | Status: AC
  Administered 2024-09-24: 2000 mg via ORAL
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
## 2024-09-24 NOTE — ED Triage Notes (Signed)
 Patient reporting exposure to [facility]. Current symptoms irritation only. No discharge. No dysuria.

## 2024-09-24 NOTE — Discharge Instructions (Signed)
 You have been empirically treated for gonorrhea with azithromycin  and gentamicin.  You have been empirically treated for chlamydia with the doxycycline sent to your pharmacy.  Take this twice daily with food for the next 7 days.  Staff will contact if we need to modify or change your treatment regimen.  Abstain from intercourse until treatment has been completed and all results have been received.  Return to clinic for any new or urgent symptoms.

## 2024-09-25 LAB — CERVICOVAGINAL ANCILLARY ONLY
Bacterial Vaginitis (gardnerella): NEGATIVE
Candida Glabrata: NEGATIVE
Candida Vaginitis: NEGATIVE
Chlamydia: NEGATIVE
Comment: NEGATIVE
Comment: NEGATIVE
Comment: NEGATIVE
Comment: NEGATIVE
Comment: NEGATIVE
Comment: NORMAL
Neisseria Gonorrhea: NEGATIVE
Trichomonas: NEGATIVE

## 2024-09-27 ENCOUNTER — Encounter: Payer: Self-pay | Admitting: Emergency Medicine

## 2024-09-27 ENCOUNTER — Ambulatory Visit: Admission: EM | Admit: 2024-09-27 | Discharge: 2024-09-27 | Disposition: A | Discharge status: Home / Self Care

## 2024-09-27 DIAGNOSIS — Z202 Contact with and (suspected) exposure to infections with a predominantly sexual mode of transmission: Secondary | ICD-10-CM | POA: Diagnosis not present

## 2024-09-27 NOTE — Discharge Instructions (Signed)
 We will not do STI testing today as it is too soon to see if you have chlamydia.  Lets finish the round of doxycycline that you were given on 12/1, refrain from sexual activity for this week and next week.  I recommend coming back in about a month and being tested then.

## 2024-09-27 NOTE — ED Triage Notes (Signed)
 Pt presents requesting STD testing. Pt reports sxs as non existent. Last sexual encounter 09/25/24.
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
## 2024-10-09 ENCOUNTER — Ambulatory Visit
Admission: EM | Admit: 2024-10-09 | Discharge: 2024-10-09 | Disposition: A | Discharge status: Home / Self Care | Source: Home / Self Care | Attending: Family Medicine | Admitting: Family Medicine

## 2024-10-09 DIAGNOSIS — Z202 Contact with and (suspected) exposure to infections with a predominantly sexual mode of transmission: Secondary | ICD-10-CM

## 2024-10-09 DIAGNOSIS — N760 Acute vaginitis: Secondary | ICD-10-CM | POA: Insufficient documentation

## 2024-10-09 MED ORDER — CLINDAMYCIN HCL 300 MG PO CAPS: 300.0000 mg | ORAL_CAPSULE | Freq: Two times a day (BID) | ORAL | 0 refills | Status: AC

## 2024-10-09 NOTE — ED Triage Notes (Signed)
 Pt present STD screening, states two weeks ago she was treated for STD but still feel irritated.

## 2024-10-09 NOTE — Discharge Instructions (Signed)
 Staff will notify you if there is anything positive on the swab. It can take 2-3 days for the tests to result, depending on the day of the week your test was taken. You will only be notified if there are any positives on the testing; test results will also go to your MyChart if you are signed up for MyChart.   --Take clindamycin  300 mg-- 1 capsule 2 times daily for 7 days
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
## 2024-10-10 ENCOUNTER — Ambulatory Visit (HOSPITAL_COMMUNITY): Payer: Self-pay

## 2024-10-10 LAB — CERVICOVAGINAL ANCILLARY ONLY
Bacterial Vaginitis (gardnerella): POSITIVE — AB
Candida Glabrata: NEGATIVE
Candida Vaginitis: NEGATIVE
Chlamydia: NEGATIVE
Comment: NEGATIVE
Comment: NEGATIVE
Comment: NEGATIVE
Comment: NEGATIVE
Comment: NEGATIVE
Comment: NORMAL
Neisseria Gonorrhea: NEGATIVE
Trichomonas: NEGATIVE

## 2024-10-29 ENCOUNTER — Ambulatory Visit: Admission: EM | Admit: 2024-10-29 | Discharge: 2024-10-29 | Disposition: A | Discharge status: Home / Self Care

## 2024-10-29 ENCOUNTER — Encounter: Payer: Self-pay | Admitting: *Deleted

## 2024-10-29 DIAGNOSIS — F17290 Nicotine dependence, other tobacco product, uncomplicated: Secondary | ICD-10-CM | POA: Insufficient documentation

## 2024-10-29 DIAGNOSIS — Z79899 Other long term (current) drug therapy: Secondary | ICD-10-CM | POA: Insufficient documentation

## 2024-10-29 DIAGNOSIS — R300 Dysuria: Secondary | ICD-10-CM | POA: Diagnosis present

## 2024-10-29 DIAGNOSIS — N898 Other specified noninflammatory disorders of vagina: Secondary | ICD-10-CM | POA: Insufficient documentation

## 2024-10-29 DIAGNOSIS — Z113 Encounter for screening for infections with a predominantly sexual mode of transmission: Secondary | ICD-10-CM | POA: Diagnosis not present

## 2024-10-29 DIAGNOSIS — N760 Acute vaginitis: Secondary | ICD-10-CM

## 2024-10-29 LAB — POCT URINE DIPSTICK
Bilirubin, UA: NEGATIVE
Blood, UA: NEGATIVE
Glucose, UA: NEGATIVE mg/dL
Ketones, POC UA: NEGATIVE mg/dL
Leukocytes, UA: NEGATIVE
Nitrite, UA: NEGATIVE
Protein Ur, POC: NEGATIVE mg/dL
Spec Grav, UA: >=1.03 — AB
Urobilinogen, UA: 0.2 U/dL
pH, UA: 5.5

## 2024-10-29 MED ORDER — FLUCONAZOLE 150 MG PO TABS: ORAL_TABLET | ORAL | 1 refills | Status: AC

## 2024-10-29 NOTE — ED Triage Notes (Signed)
 Pt reports vaginal irritation x 1 week with discharge like a yeast infection. Also voices concern for possible UTI. She took AZO last week with some relief.

## 2024-10-29 NOTE — Discharge Instructions (Signed)
 Urine does not indicate UTI, will send swab for STIs, yeast, and BV and will send diflucan  to pharmacy.
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
## 2024-10-30 LAB — CERVICOVAGINAL ANCILLARY ONLY
Bacterial Vaginitis (gardnerella): NEGATIVE
Candida Glabrata: NEGATIVE
Candida Vaginitis: NEGATIVE
Chlamydia: NEGATIVE
Comment: NEGATIVE
Comment: NEGATIVE
Comment: NEGATIVE
Comment: NEGATIVE
Comment: NEGATIVE
Comment: NORMAL
Neisseria Gonorrhea: NEGATIVE
Trichomonas: NEGATIVE

## 2024-11-14 ENCOUNTER — Ambulatory Visit: Admission: EM | Admit: 2024-11-14 | Discharge: 2024-11-14 | Disposition: A | Discharge status: Home / Self Care

## 2024-11-14 ENCOUNTER — Other Ambulatory Visit: Payer: Self-pay

## 2024-11-14 DIAGNOSIS — J111 Influenza due to unidentified influenza virus with other respiratory manifestations: Secondary | ICD-10-CM

## 2024-11-14 MED ORDER — OSELTAMIVIR PHOSPHATE 75 MG PO CAPS: 75.0000 mg | ORAL_CAPSULE | Freq: Two times a day (BID) | ORAL | 0 refills | Status: AC

## 2024-11-14 MED ORDER — AZELASTINE HCL 0.1 % NA SOLN: 1.0000 | Freq: Two times a day (BID) | NASAL | 1 refills | Status: AC

## 2024-11-14 MED ORDER — IBUPROFEN 800 MG PO TABS: 800.0000 mg | ORAL_TABLET | Freq: Three times a day (TID) | ORAL | 0 refills | Status: AC

## 2024-11-14 MED ORDER — ACETAMINOPHEN 325 MG PO TABS
650.0000 mg | ORAL_TABLET | Freq: Once | ORAL | Status: AC
  Administered 2024-11-14: 650 mg via ORAL

## 2024-11-14 MED ORDER — PROMETHAZINE-DM 6.25-15 MG/5ML PO SYRP: 10.0000 mL | ORAL_SOLUTION | Freq: Three times a day (TID) | ORAL | 0 refills | Status: AC | PRN

## 2024-11-14 NOTE — Discharge Instructions (Addendum)
" °  1. Influenza-like illness (Primary) - acetaminophen  (TYLENOL ) tablet 650 mg given in UC for acute fever secondary to viral illness. - oseltamivir  (TAMIFLU ) 75 MG capsule; Take 1 capsule (75 mg total) by mouth every 12 (twelve) hours.  Dispense: 10 capsule; Refill: 0 - ibuprofen  (ADVIL ) 800 MG tablet; Take 1 tablet (800 mg total) by mouth 3 (three) times daily.  Dispense: 30 tablet; Refill: 0 - azelastine  (ASTELIN ) 0.1 % nasal spray; Place 1 spray into both nostrils 2 (two) times daily. Use in each nostril as directed  Dispense: 30 mL; Refill: 1 - promethazine -dextromethorphan (PROMETHAZINE -DM) 6.25-15 MG/5ML syrup; Take 10 mLs by mouth 3 (three) times daily as needed.  Dispense: 240 mL; Refill: 0  -Continue to monitor symptoms for any change in severity if there is any escalation of current symptoms or development of new symptoms follow-up in ER for further evaluation and management. "
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
## 2024-11-14 NOTE — ED Triage Notes (Addendum)
 Pt reports she flew home from [place] on Monday. Started feeling bad that day, worse yesterday. C/o cough, body aches, sore throat, headache, blurry vision, I haven't been able to eat or drink. She has tried alka-seltzer cold plus without relief. Last dose last night. Fever unknown.

## 2024-11-27 ENCOUNTER — Ambulatory Visit
Admission: EM | Admit: 2024-11-27 | Discharge: 2024-11-27 | Disposition: A | Discharge status: Home / Self Care | Source: Home / Self Care

## 2024-11-27 ENCOUNTER — Encounter: Payer: Self-pay | Admitting: Emergency Medicine

## 2024-11-27 ENCOUNTER — Other Ambulatory Visit: Payer: Self-pay

## 2024-11-27 DIAGNOSIS — N760 Acute vaginitis: Secondary | ICD-10-CM

## 2024-11-27 DIAGNOSIS — Z202 Contact with and (suspected) exposure to infections with a predominantly sexual mode of transmission: Secondary | ICD-10-CM

## 2024-11-27 MED ORDER — DOXYCYCLINE HYCLATE 100 MG PO CAPS: 100.0000 mg | ORAL_CAPSULE | Freq: Two times a day (BID) | ORAL | 0 refills | Status: AC

## 2024-11-27 MED ORDER — GENTAMICIN SULFATE 40 MG/ML IJ SOLN
240.0000 mg | Freq: Once | INTRAMUSCULAR | Status: AC
  Administered 2024-11-27: 240 mg via INTRAMUSCULAR

## 2024-11-27 MED ORDER — CLINDAMYCIN HCL 300 MG PO CAPS: 300.0000 mg | ORAL_CAPSULE | Freq: Two times a day (BID) | ORAL | 0 refills | Status: AC

## 2024-11-27 NOTE — Discharge Instructions (Addendum)
 Will send swab from gonorrhea, chlamydia, trich, BV, and yeast  Pt will be treated for BV with clindamycin , will treat for Chlamydia with Doxycycline , and Gonorrhea with Gentamicin  injection in office.

## 2024-11-27 NOTE — ED Triage Notes (Signed)
 Pt here for possible exposure to GC/chlamydia; pt sts received a call from health department today; pt sts intercourse was 5 days ago; pt denies discharge but sts odor present

## 2024-11-28 LAB — CERVICOVAGINAL ANCILLARY ONLY
Bacterial Vaginitis (gardnerella): NEGATIVE
Candida Glabrata: NEGATIVE
Candida Vaginitis: NEGATIVE
Chlamydia: NEGATIVE
Comment: NEGATIVE
Comment: NEGATIVE
Comment: NEGATIVE
Comment: NEGATIVE
Comment: NEGATIVE
Comment: NORMAL
Neisseria Gonorrhea: NEGATIVE
Trichomonas: NEGATIVE
